# Patient Record
Sex: Male | Born: 1950
Health system: Southern US, Community
[De-identification: ages and names within clinical notes are randomized; demographics above are authoritative.]

## PROBLEM LIST (undated history)

## (undated) DIAGNOSIS — G4730 Sleep apnea, unspecified: Secondary | ICD-10-CM

## (undated) DIAGNOSIS — E785 Hyperlipidemia, unspecified: Secondary | ICD-10-CM

## (undated) DIAGNOSIS — I10 Essential (primary) hypertension: Secondary | ICD-10-CM

## (undated) DIAGNOSIS — I480 Paroxysmal atrial fibrillation: Secondary | ICD-10-CM

## (undated) DIAGNOSIS — E669 Obesity, unspecified: Secondary | ICD-10-CM

## (undated) DIAGNOSIS — I5032 Chronic diastolic (congestive) heart failure: Secondary | ICD-10-CM

## (undated) DIAGNOSIS — R600 Localized edema: Secondary | ICD-10-CM

## (undated) DIAGNOSIS — I499 Cardiac arrhythmia, unspecified: Secondary | ICD-10-CM

## (undated) DIAGNOSIS — E119 Type 2 diabetes mellitus without complications: Secondary | ICD-10-CM

## (undated) HISTORY — DX: Localized edema: R60.0

## (undated) HISTORY — DX: Essential (primary) hypertension: I10

## (undated) HISTORY — DX: Chronic diastolic (congestive) heart failure: I50.32

## (undated) HISTORY — PX: CARDIAC CATHETERIZATION: SHX172

## (undated) HISTORY — DX: Hyperlipidemia, unspecified: E78.5

## (undated) HISTORY — DX: Obesity, unspecified: E66.9

## (undated) HISTORY — DX: Paroxysmal atrial fibrillation: I48.0

## (undated) HISTORY — DX: Type 2 diabetes mellitus without complications: E11.9

---

## 2001-12-23 ENCOUNTER — Ambulatory Visit (HOSPITAL_COMMUNITY): Admission: RE | Admit: 2001-12-23 | Discharge: 2001-12-23 | Payer: Self-pay | Admitting: *Deleted

## 2002-04-05 ENCOUNTER — Encounter: Admission: RE | Admit: 2002-04-05 | Discharge: 2002-07-04 | Payer: Self-pay | Admitting: Internal Medicine

## 2009-10-28 DIAGNOSIS — E7800 Pure hypercholesterolemia, unspecified: Secondary | ICD-10-CM | POA: Insufficient documentation

## 2009-12-04 DIAGNOSIS — M109 Gout, unspecified: Secondary | ICD-10-CM | POA: Insufficient documentation

## 2010-11-25 HISTORY — PX: CARDIAC CATHETERIZATION: SHX172

## 2011-07-17 ENCOUNTER — Inpatient Hospital Stay (HOSPITAL_COMMUNITY)
Admission: EM | Admit: 2011-07-17 | Discharge: 2011-07-18 | DRG: 287 | Disposition: A | Discharge status: Home / Self Care | Payer: 59 | Attending: Cardiovascular Disease | Admitting: Cardiovascular Disease

## 2011-07-17 ENCOUNTER — Emergency Department (HOSPITAL_COMMUNITY): Payer: 59

## 2011-07-17 DIAGNOSIS — Z79899 Other long term (current) drug therapy: Secondary | ICD-10-CM

## 2011-07-17 DIAGNOSIS — G4730 Sleep apnea, unspecified: Secondary | ICD-10-CM | POA: Diagnosis present

## 2011-07-17 DIAGNOSIS — Z794 Long term (current) use of insulin: Secondary | ICD-10-CM

## 2011-07-17 DIAGNOSIS — I129 Hypertensive chronic kidney disease with stage 1 through stage 4 chronic kidney disease, or unspecified chronic kidney disease: Secondary | ICD-10-CM | POA: Diagnosis present

## 2011-07-17 DIAGNOSIS — Z87891 Personal history of nicotine dependence: Secondary | ICD-10-CM

## 2011-07-17 DIAGNOSIS — I200 Unstable angina: Secondary | ICD-10-CM | POA: Diagnosis present

## 2011-07-17 DIAGNOSIS — N189 Chronic kidney disease, unspecified: Secondary | ICD-10-CM | POA: Diagnosis present

## 2011-07-17 DIAGNOSIS — E119 Type 2 diabetes mellitus without complications: Secondary | ICD-10-CM | POA: Diagnosis present

## 2011-07-17 DIAGNOSIS — Z7982 Long term (current) use of aspirin: Secondary | ICD-10-CM

## 2011-07-17 DIAGNOSIS — I4891 Unspecified atrial fibrillation: Principal | ICD-10-CM | POA: Diagnosis present

## 2011-07-17 DIAGNOSIS — Z23 Encounter for immunization: Secondary | ICD-10-CM

## 2011-07-17 DIAGNOSIS — E785 Hyperlipidemia, unspecified: Secondary | ICD-10-CM | POA: Diagnosis present

## 2011-07-17 LAB — GLUCOSE, CAPILLARY

## 2011-07-17 LAB — URINALYSIS, ROUTINE W REFLEX MICROSCOPIC
Bilirubin Urine: NEGATIVE
Nitrite: NEGATIVE
Protein, ur: 30 mg/dL — AB
Specific Gravity, Urine: 1.021 (ref 1.005–1.030)
Urobilinogen, UA: 0.2 mg/dL (ref 0.0–1.0)

## 2011-07-17 LAB — COMPREHENSIVE METABOLIC PANEL
ALT: 20 U/L (ref 0–53)
CO2: 29 mEq/L (ref 19–32)
Calcium: 10.3 mg/dL (ref 8.4–10.5)
Chloride: 101 mEq/L (ref 96–112)
GFR calc Af Amer: >60 mL/min (ref >60)
GFR calc non Af Amer: >60 mL/min (ref >60)
Glucose, Bld: 188 mg/dL — ABNORMAL HIGH (ref 70–99)
Sodium: 138 mEq/L (ref 135–145)
Total Bilirubin: 0.2 mg/dL — ABNORMAL LOW (ref 0.3–1.2)

## 2011-07-17 LAB — URINE MICROSCOPIC-ADD ON

## 2011-07-17 LAB — BASIC METABOLIC PANEL
GFR calc Af Amer: >60 mL/min (ref >60)
GFR calc non Af Amer: >60 mL/min (ref >60)
Potassium: 3.7 mEq/L (ref 3.5–5.1)
Sodium: 134 mEq/L — ABNORMAL LOW (ref 135–145)

## 2011-07-17 LAB — MRSA PCR SCREENING: MRSA by PCR: NEGATIVE

## 2011-07-17 LAB — DIFFERENTIAL
Basophils Absolute: 0 10*3/uL (ref 0.0–0.1)
Basophils Relative: 0 % (ref 0–1)
Eosinophils Absolute: 0.1 10*3/uL (ref 0.0–0.7)
Neutrophils Relative %: 61 % (ref 43–77)

## 2011-07-17 LAB — CBC
Platelets: 288 10*3/uL (ref 150–400)
RBC: 5.12 MIL/uL (ref 4.22–5.81)
WBC: 5.9 10*3/uL (ref 4.0–10.5)

## 2011-07-17 LAB — TSH: TSH: 1.92 u[IU]/mL (ref 0.350–4.500)

## 2011-07-17 LAB — POCT I-STAT TROPONIN I: Troponin i, poc: 0 ng/mL (ref 0.00–0.08)

## 2011-07-18 LAB — BASIC METABOLIC PANEL
Chloride: 100 mEq/L (ref 96–112)
GFR calc Af Amer: 59 mL/min — ABNORMAL LOW (ref >60)
GFR calc non Af Amer: 49 mL/min — ABNORMAL LOW (ref >60)
Potassium: 3.8 mEq/L (ref 3.5–5.1)
Sodium: 135 mEq/L (ref 135–145)

## 2011-07-18 LAB — POCT ACTIVATED CLOTTING TIME: Activated Clotting Time: 127 seconds

## 2011-07-18 LAB — LIPID PANEL
Cholesterol: 229 mg/dL — ABNORMAL HIGH (ref 0–200)
HDL: 46 mg/dL (ref >39)
Total CHOL/HDL Ratio: 5 RATIO

## 2011-07-18 LAB — CBC
Platelets: 282 10*3/uL (ref 150–400)
RBC: 4.82 MIL/uL (ref 4.22–5.81)
RDW: 12.7 % (ref 11.5–15.5)
WBC: 6.2 10*3/uL (ref 4.0–10.5)

## 2011-07-18 LAB — GLUCOSE, CAPILLARY: Glucose-Capillary: 189 mg/dL — ABNORMAL HIGH (ref 70–99)

## 2011-07-18 LAB — HEMOGLOBIN A1C
Hgb A1c MFr Bld: 11.1 % — ABNORMAL HIGH (ref <5.7)
Mean Plasma Glucose: 272 mg/dL — ABNORMAL HIGH (ref <117)

## 2011-07-18 LAB — HEPARIN LEVEL (UNFRACTIONATED): Heparin Unfractionated: 0.48 IU/mL (ref 0.30–0.70)

## 2011-07-19 NOTE — Cardiovascular Report (Signed)
  Raymond Gordon, Raymond Gordon NO.:  [id number]  MEDICAL RECORD NO.:  [id number]  LOCATION:  2919                         FACILITY:  [facility]  PHYSICIAN:  Raymond Leana Springston, MD         DATE OF BIRTH:  01-04-1951  DATE OF PROCEDURE:  07/18/2011 DATE OF DISCHARGE:  07/18/2011                           CARDIAC CATHETERIZATION   OPERATOR:  Raymond Shaddai Shapley, MD  INDICATION:  Chest pain and dynamic EKG changes in the setting of atrial fibrillation and rapid ventricular response.  HISTORY OF PRESENT ILLNESS:  Raymond Gordon is a 60 year old gentleman who presented with substernal chest pain and increasing shortness of breath. He was found to be in atrial fibrillation with rapid ventricular response, subsequently converted spontaneously to sinus rhythm overnight.  However, had diffuse ST depression with his rapid ventricular response and given his cardiac risk factors referred for a left heart catheterization.  PROCEDURE:  After informed consent was obtained, the patient was brought to the cardiac catheterization lab and sterilely prepped and draped in the usual fashion.  After procedural and radiation safety time-out, the area around the right femoral artery was identified and anesthetized with approximately 5 mL of 1% lidocaine.  The right femoral artery was accessed easily with a straight wire needle and a right 5-French right femoral access sheath was placed.  A subsequent catheterization was performed with a 5-French pigtail, JL-4, JR-4 and ultimately Williams right catheter which was used to engage the right coronary.  A left ventriculogram was performed.  Estimated blood loss was less than 10 mL. There were no acute complications.  FINDINGS: 1. Mild luminal irregularities, small vessels without stenosis. 2. LV gram - EF 70-75%, no wall motion abnormalities.  PLAN:  Raymond Gordon has no significant obstructive coronary artery disease and is likely okay to be discharged home later today.   Given his cardiac risk factors and CHADS-2 score of greater than 2, I would recommend anticoagulation with oral anticoagulant such as Pradaxa.  Our plan is to give him one single dose Pradaxa once he returns to the Cardiac Care Unit and we will discontinue his heparin drip approximately 2 hours afterwards.  He should go home on 150 mg Pradaxa twice daily and will follow up with Raymond Gordon in the office. It should be noted he had 8 second  pauses while on a diltiazem drip in atrial fibrillation and none since he converted spontaneously to sinus rhythm. I will arrange an outpatient Holter monitor to look for any more ominous brady-arryhmias or pauses. There is no clear indication for pacemaker at this time.     Raymond Phiona Ramnauth, MD     CH/MEDQ  D:  07/18/2011  T:  07/18/2011  Job:  [id number]  cc:   Gaspar Garbe, M.D. Nanetta Batty, M.D.  Electronically Signed by Kirtland Bouchard. Cheyenne Bordeaux M.D. on 07/19/2011 10:55:07 AM

## 2011-08-04 ENCOUNTER — Emergency Department (HOSPITAL_COMMUNITY)
Admission: EM | Admit: 2011-08-04 | Discharge: 2011-08-04 | Disposition: A | Discharge status: Home / Self Care | Payer: 59 | Attending: Emergency Medicine | Admitting: Emergency Medicine

## 2011-08-04 DIAGNOSIS — I10 Essential (primary) hypertension: Secondary | ICD-10-CM | POA: Insufficient documentation

## 2011-08-04 DIAGNOSIS — R42 Dizziness and giddiness: Secondary | ICD-10-CM | POA: Insufficient documentation

## 2011-08-04 DIAGNOSIS — E1169 Type 2 diabetes mellitus with other specified complication: Secondary | ICD-10-CM | POA: Insufficient documentation

## 2011-08-04 DIAGNOSIS — E7800 Pure hypercholesterolemia, unspecified: Secondary | ICD-10-CM | POA: Insufficient documentation

## 2011-08-04 DIAGNOSIS — I4891 Unspecified atrial fibrillation: Secondary | ICD-10-CM | POA: Insufficient documentation

## 2011-08-04 LAB — GLUCOSE, CAPILLARY: Glucose-Capillary: 81 mg/dL (ref 70–99)

## 2011-08-08 ENCOUNTER — Emergency Department (HOSPITAL_COMMUNITY)
Admission: EM | Admit: 2011-08-08 | Discharge: 2011-08-08 | Disposition: A | Discharge status: Home / Self Care | Payer: 59 | Attending: Emergency Medicine | Admitting: Emergency Medicine

## 2011-08-08 DIAGNOSIS — I4891 Unspecified atrial fibrillation: Secondary | ICD-10-CM | POA: Insufficient documentation

## 2011-08-08 DIAGNOSIS — E1169 Type 2 diabetes mellitus with other specified complication: Secondary | ICD-10-CM | POA: Insufficient documentation

## 2011-08-08 DIAGNOSIS — F29 Unspecified psychosis not due to a substance or known physiological condition: Secondary | ICD-10-CM | POA: Insufficient documentation

## 2011-08-08 DIAGNOSIS — I10 Essential (primary) hypertension: Secondary | ICD-10-CM | POA: Insufficient documentation

## 2011-08-08 DIAGNOSIS — Z794 Long term (current) use of insulin: Secondary | ICD-10-CM | POA: Insufficient documentation

## 2011-08-08 DIAGNOSIS — E7800 Pure hypercholesterolemia, unspecified: Secondary | ICD-10-CM | POA: Insufficient documentation

## 2011-08-08 LAB — GLUCOSE, CAPILLARY: Glucose-Capillary: 93 mg/dL (ref 70–99)

## 2011-08-08 LAB — POCT I-STAT, CHEM 8
BUN: 7 mg/dL (ref 6–23)
Creatinine, Ser: 0.8 mg/dL (ref 0.50–1.35)
Glucose, Bld: 82 mg/dL (ref 70–99)
Hemoglobin: 14.3 g/dL (ref 13.0–17.0)
Potassium: 3.4 mEq/L — ABNORMAL LOW (ref 3.5–5.1)
TCO2: 26 mmol/L (ref 0–100)

## 2011-08-25 NOTE — Discharge Summary (Signed)
  Raymond Gordon, Raymond Gordon NO.:  [id number]  MEDICAL RECORD NO.:  [id number]  LOCATION:  2919                         FACILITY:  [facility]  PHYSICIAN:  Nanetta Batty, M.D.   DATE OF BIRTH:  09/04/1951  DATE OF ADMISSION:  07/17/2011 DATE OF DISCHARGE:  07/18/2011                              DISCHARGE SUMMARY   DISCHARGE DIAGNOSES: 1. Paroxysmal atrial fibrillation, new onset this admission, converted     spontaneously to sinus rhythm. 2. Chest pain worrisome for unstable angina in the setting of rapid     atrial fibrillation, catheterization this admission revealing     normal coronaries and normal left ventricular function. 3. Type 2 insulin-dependent diabetes. 4. Treated hypertension. 5. Treated dyslipidemia. 6. Family history of vascular disease. 7. Morbid obesity and suspected sleep apnea with truncal obesity. 8. Poor dentition  PLAN:  Raymond Gordon is a 60 year old male who has been followed by Dr. Wylene Simmer.  He has hypertension, dyslipidemia, and insulin-dependent diabetes.  He has no prior history of coronary disease and has not had arrhythmias.  This morning he developed chest tightness.  He was short of breath with this.  In the emergency room, he was found to be in rapid atrial fibrillation.  He was put on diltiazem and heparin and admitted for further evaluation.  He converted spontaneously during the night to sinus rhythm.  Catheterization was done July 18, 2011 by Dr. Rennis Golden which revealed essentially normal coronaries, he did have small diabetic vessels.  EF was 70-75% without wall motion abnormality.  Dr. Rennis Golden felt he would be a good candidate for Pradaxa.  We have made some other adjustments to his medications, see med rec for complete details.  LABORATORY DATA:  Cholesterol 229, HDL 46, LDL 163.  BNP was 163.  TSH 1.92.  Troponin was negative.  Hemoglobin A1c was 11.1.  Liver functions were normal.  Sodium 138, potassium 3.8, BUN 8, creatinine  0.74, INR 0.9, white count 5.9, hemoglobin 14.6, hematocrit 41.5, platelets 288,000.  Chest x-ray shows no acute process.  EKG now shows sinus rhythm, sinus bradycardia without acute changes.  In the emergency room, he did have some pauses that were significant when he converted to sinus rhythm, the pause was about 5 seconds.  He had no further pauses.  We did change his beta-blocker.  He will follow up with Dr. Allyson Sabal.  At some point, we may want to consider a sleep study.     Abelino Derrick, P.A.   ______________________________ Nanetta Batty, M.D.    [name]  D:  07/18/2011  T:  07/18/2011  Job:  [id number]  cc:   Nanetta Batty, M.D. Gaspar Garbe, M.D.  Electronically Signed by Corine Shelter P.A. on 07/22/2011 09:37:59 AM Electronically Signed by Nanetta Batty M.D. on 08/25/2011 11:40:51 AM

## 2011-08-25 NOTE — H&P (Signed)
Raymond Gordon, Raymond Gordon NO.:  [id number]  MEDICAL RECORD NO.:  [id number]  LOCATION:  [facility]                         FACILITY:  [facility]  PHYSICIAN:  Nanetta Batty, M.D.   DATE OF BIRTH:  06-Jun-1951  DATE OF ADMISSION:  07/17/2011 DATE OF DISCHARGE:                             HISTORY & PHYSICAL   CHIEF COMPLAINT:  Chest pain.  HISTORY OF PRESENT ILLNESS:  Mr. Russi is a 60 year old male who is followed by Dr. Wylene Simmer.  He has hypertension, dyslipidemia, and insulin- dependent diabetes.  He has no prior history of coronary artery disease and has had no prior cardiac evaluation according to the patient.  This morning he developed some chest tightness.  This was described as a midsternal tightness.  He was short of breath with this.  He came to the emergency room at [facility] and was found to be in rapid atrial fibrillation. His symptoms improved with IV diltiazem.  He is admitted now for further evaluation.  PAST MEDICAL HISTORY:  Remarkable for type 2 insulin-dependent diabetes. He has treated hypertension and treated dyslipidemia.  He denies any major surgeries in the past.  HOME MEDICATIONS:  Diovan, Lipitor, metformin, Humulin 70/30, and Lopressor, doses are unknown yet.  ALLERGIES:  He has no known drug allergies.  SOCIAL HISTORY:  He is single, he lives with his mother.  He quit smoking in 1999.  He works for [employer] in [employer], and has worked there for 38 years.  He denies alcohol use.  FAMILY HISTORY:  Remarkable that his father died of a stroke and he has a brother who has had a stroke.  REVIEW OF SYSTEMS:  Essentially unremarkable except for noted above.  He denies any GI bleeding or melena.  He has not had any kidney disease or trouble voiding, prostate problems.  He denies any retinopathy, nephropathy, or neuropathy from his diabetes.  PHYSICAL EXAM:  VITAL SIGNS:  Blood pressure 147/71, pulse 128, temperature 98.6. GENERAL:  He  is an obese, African American male, in no acute distress. HEENT:  Normocephalic.  He wears glasses.  He has poor dentition. NECK:  Without JVD or bruit.  Thyroid is not enlarged. CHEST:  Clear to auscultation and percussion. CARDIAC:  Reveals irregularly irregular rhythm without murmur, rub, or gallop.  Normal S1 and S2. ABDOMEN:  Obese, nontender.  No bruits. EXTREMITIES:  Reveal 1+ pretibial edema bilaterally with intact distal pulses. NEURO:  Grossly intact.  He is awake, alert, oriented, and cooperative. Moves all extremities without obvious deficit. SKIN:  Cool and dry.  LABS:  Sodium 134, potassium 3.7, BUN 9, creatinine 0.66.  White count 5.9, hemoglobin 14.6, hematocrit 41.5, platelets 228.  Troponin is negative x1.  Chest x-rays are negative.  EKG shows atrial fibrillation with rapid ventricular response.  IMPRESSION: 1. Chest pain consistent with unstable angina in the setting of rapid     atrial fibrillation. 2. Rapid atrial fibrillation, apparently is new onset. 3. Type 2 insulin-dependent diabetes. 4. Treated hypertension. 5. Treated dyslipidemia. 6. Family history of vascular disease. 7. Morbid obesity and possible sleep apnea with truncal obesity. 8. Poor dentition.  PLAN:  The patient will  be admitted by Dr. Allyson Sabal and myself.  He is on IV diltiazem now and we will add IV heparin and continue metoprolol.  He will most likely need diagnostic catheterization because of his unstable angina and multiple cardiac risk factors.     Abelino Derrick, P.A.   ______________________________ Nanetta Batty, M.D.    [name]  D:  07/17/2011  T:  07/17/2011  Job:  [id number]  Electronically Signed by Corine Shelter P.A. on 07/22/2011 09:37:54 AM Electronically Signed by Nanetta Batty M.D. on 08/25/2011 11:40:48 AM

## 2013-02-16 DIAGNOSIS — I517 Cardiomegaly: Secondary | ICD-10-CM | POA: Insufficient documentation

## 2013-02-16 DIAGNOSIS — R972 Elevated prostate specific antigen [PSA]: Secondary | ICD-10-CM | POA: Insufficient documentation

## 2013-04-07 ENCOUNTER — Other Ambulatory Visit: Payer: Self-pay | Admitting: *Deleted

## 2013-04-07 MED ORDER — DABIGATRAN ETEXILATE MESYLATE 150 MG PO CAPS: 150.0000 mg | ORAL_CAPSULE | Freq: Two times a day (BID) | ORAL | Status: DC

## 2013-04-07 NOTE — Telephone Encounter (Signed)
Rx sent to pharmacy   

## 2013-04-08 ENCOUNTER — Telehealth: Payer: Self-pay | Admitting: Cardiovascular Disease

## 2013-04-08 ENCOUNTER — Other Ambulatory Visit: Payer: Self-pay | Admitting: *Deleted

## 2013-04-08 ENCOUNTER — Telehealth: Payer: Self-pay | Admitting: Internal Medicine

## 2013-04-08 MED ORDER — DABIGATRAN ETEXILATE MESYLATE 150 MG PO CAPS: 150.0000 mg | ORAL_CAPSULE | Freq: Two times a day (BID) | ORAL | Status: DC

## 2013-04-08 NOTE — Telephone Encounter (Signed)
Need clarification on how to take his Pradaxa!al

## 2013-04-08 NOTE — Telephone Encounter (Signed)
Need clarification on how to take his Pradaxa! Prescription was different from the last one!

## 2013-04-08 NOTE — Telephone Encounter (Signed)
Patient was confused on how to take his pradaxa. Informed him the directions did not change. He is to continue taking it twice daily. He was confused because the new instructions says every 12hrs vs twice a day.

## 2013-04-08 NOTE — Telephone Encounter (Signed)
Needs a prescription refill on; Pradaxa-150mg ... Pharmacy.... [facility]...[phone]..Thanks

## 2013-04-08 NOTE — Telephone Encounter (Signed)
RX REFILLED ON 5/14.

## 2013-07-18 ENCOUNTER — Other Ambulatory Visit: Payer: Self-pay | Admitting: Cardiovascular Disease

## 2013-10-03 ENCOUNTER — Other Ambulatory Visit: Payer: Self-pay | Admitting: Cardiovascular Disease

## 2013-10-04 ENCOUNTER — Other Ambulatory Visit: Payer: Self-pay | Admitting: *Deleted

## 2013-10-04 MED ORDER — CARVEDILOL 25 MG PO TABS: 37.5000 mg | ORAL_TABLET | Freq: Two times a day (BID) | ORAL | Status: DC

## 2013-10-04 NOTE — Telephone Encounter (Signed)
Rx was sent to pharmacy electronically. 

## 2013-11-30 ENCOUNTER — Encounter: Payer: Self-pay | Admitting: Cardiovascular Disease

## 2013-11-30 ENCOUNTER — Ambulatory Visit (INDEPENDENT_AMBULATORY_CARE_PROVIDER_SITE_OTHER): Payer: 59 | Admitting: Cardiovascular Disease

## 2013-11-30 VITALS — BP 160/60 | HR 66 | Ht 64.0 in | Wt 258.0 lb

## 2013-11-30 DIAGNOSIS — I480 Paroxysmal atrial fibrillation: Secondary | ICD-10-CM | POA: Insufficient documentation

## 2013-11-30 DIAGNOSIS — E119 Type 2 diabetes mellitus without complications: Secondary | ICD-10-CM | POA: Insufficient documentation

## 2013-11-30 DIAGNOSIS — I10 Essential (primary) hypertension: Secondary | ICD-10-CM | POA: Insufficient documentation

## 2013-11-30 DIAGNOSIS — I4891 Unspecified atrial fibrillation: Secondary | ICD-10-CM

## 2013-11-30 DIAGNOSIS — E1169 Type 2 diabetes mellitus with other specified complication: Secondary | ICD-10-CM | POA: Insufficient documentation

## 2013-11-30 DIAGNOSIS — E785 Hyperlipidemia, unspecified: Secondary | ICD-10-CM | POA: Insufficient documentation

## 2013-11-30 NOTE — Assessment & Plan Note (Signed)
Maintaining sinus rhythm on Pradaxa oral anticoagulations without recurrence

## 2013-11-30 NOTE — Progress Notes (Signed)
11/30/2013 Raymond Gordon   05/28/1951  [id number]  Primary Physician No primary provider on file. Primary Cardiologist: Runell GessJonathan J. Pricella Gaugh MD Roseanne RenoFACP,FACC,FAHA, FSCAI   HPI:  The patient is a very pleasant, 63 year old severely overweight, single [place] male with no children who I last saw in the office 6 months ago. 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 His risk factors including noninsulin-requiring diabetes, hypertension, hyperlipidemia and remote tobacco abuse having quit smoking back in 1999 and had smoked 1 pack per day. His father did have an MI and had bypass surgery. He was [facility] July 17, 2011, with chest pain and A-fib. He was catheterized by Dr. Italyhad Hilty on July 18, 2011, revealing minimal luminal irregularities with small vessel disease and normal LV function. He ultimately spontaneously converted to sinus rhythm and was placed on Pradaxa because of a CHADS2 score of 2. He has had no recurrent symptoms. He denies chest pain or shortness of breath.Dr. Wylene Simmerisovec follows  his lipid profile.   Current Outpatient Prescriptions  Medication Sig Dispense Refill  . allopurinol (ZYLOPRIM) 100 MG tablet Take 100 mg by mouth daily.       Marland Kitchen. amLODipine (NORVASC) 10 MG tablet Take 10 mg by mouth daily.       Marland Kitchen. atorvastatin (LIPITOR) 40 MG tablet Take 40 mg by mouth daily at 6 PM.       . carvedilol (COREG) 25 MG tablet Take 1.5 tablets (37.5 mg total) by mouth 2 (two) times daily with a meal.  90 tablet  0  . dabigatran (PRADAXA) 150 MG CAPS Take 1 capsule (150 mg total) by mouth every 12 (twelve) hours.  60 capsule  6  . DIOVAN 160 MG tablet Take 160 mg by mouth 2 (two) times daily.       Marland Kitchen. doxazosin (CARDURA) 4 MG tablet Take 4 mg by mouth daily.       Marland Kitchen. FARXIGA 10 MG TABS Take 10 mg by mouth daily.       . hydrochlorothiazide (HYDRODIURIL) 25 MG tablet Take 25 mg by mouth daily.      . insulin NPH-regular Human (NOVOLIN 70/30) (70-30) 100 UNIT/ML injection  Inject into the skin. 70 units in the morning, 50 units at lunch, and 50 units at night      . metoprolol tartrate (LOPRESSOR) 25 MG tablet Take 25 mg by mouth daily.      . Multiple Vitamin (MULTIVITAMIN) capsule Take 1 capsule by mouth daily.      . pantoprazole (PROTONIX) 40 MG tablet Take 40 mg by mouth daily.       . ramipril (ALTACE) 10 MG capsule Take 10 mg by mouth 2 (two) times daily.        No current facility-administered medications for this visit.    No Known Allergies  History   Social History  . Marital Status: Single    Spouse Name: N/A    Number of Children: N/A  . Years of Education: N/A   Occupational History  . Not on file.   Social History Main Topics  . Smoking status: Former Smoker    Quit date: 11/25/1997  . Smokeless tobacco: Not on file  . Alcohol Use: Not on file  . Drug Use: Not on file  . Sexual Activity: Not on file   Other Topics Concern  . Not on file   Social History Narrative  . No narrative on file  Review of Systems: General: negative for chills, fever, night sweats or weight changes.  Cardiovascular: negative for chest pain, dyspnea on exertion, edema, orthopnea, palpitations, paroxysmal nocturnal dyspnea or shortness of breath Dermatological: negative for rash Respiratory: negative for cough or wheezing Urologic: negative for hematuria Abdominal: negative for nausea, vomiting, diarrhea, bright red blood per rectum, melena, or hematemesis Neurologic: negative for visual changes, syncope, or dizziness All other systems reviewed and are otherwise negative except as noted above.    Blood pressure 160/60, pulse 66, height 5\' 4"  (1.626 m), weight 258 lb (117.028 kg).  General appearance: alert and no distress Neck: no adenopathy, no carotid bruit, no JVD, supple, symmetrical, trachea midline and thyroid not enlarged, symmetric, no tenderness/mass/nodules Lungs: clear to auscultation bilaterally Heart: regular rate and rhythm, S1,  S2 normal, no murmur, click, rub or gallop Extremities: extremities normal, atraumatic, no cyanosis or edema  EKG normal sinus rhythm at 66 without ST or T wave changes  ASSESSMENT AND PLAN:   Paroxysmal atrial fibrillation Maintaining sinus rhythm on Pradaxa oral anticoagulations without recurrence  Essential hypertension Borderline controlled on current medications  Hyperlipidemia On statin therapy followed by his PCP      Runell Gess MD [facility], [facility] 11/30/2013 10:26 AM

## 2013-11-30 NOTE — Assessment & Plan Note (Signed)
On statin therapy followed by his PCP 

## 2013-11-30 NOTE — Patient Instructions (Signed)
No changes in medication.  Dr Allyson SabalBerry wants you to follow-up in 1 year. You will receive a reminder letter in the mail two months in advance. If you don't receive a letter, please call our office to schedule the follow-up appointment.

## 2013-11-30 NOTE — Assessment & Plan Note (Signed)
Borderline controlled on current medications 

## 2013-12-01 ENCOUNTER — Encounter: Payer: Self-pay | Admitting: Cardiovascular Disease

## 2013-12-06 ENCOUNTER — Other Ambulatory Visit: Payer: Self-pay | Admitting: Cardiovascular Disease

## 2013-12-06 MED ORDER — PANTOPRAZOLE SODIUM 40 MG PO TBEC: 40.0000 mg | DELAYED_RELEASE_TABLET | Freq: Every day | ORAL | Status: DC

## 2013-12-06 NOTE — Telephone Encounter (Signed)
Rx was sent to pharmacy electronically. 

## 2014-05-05 ENCOUNTER — Encounter: Payer: Self-pay | Admitting: Podiatrist

## 2014-05-05 ENCOUNTER — Ambulatory Visit (INDEPENDENT_AMBULATORY_CARE_PROVIDER_SITE_OTHER): Payer: 59 | Admitting: Podiatrist

## 2014-05-05 VITALS — BP 143/64 | HR 64 | Resp 18 | Ht 65.0 in | Wt 269.0 lb

## 2014-05-05 DIAGNOSIS — E1151 Type 2 diabetes mellitus with diabetic peripheral angiopathy without gangrene: Secondary | ICD-10-CM

## 2014-05-05 DIAGNOSIS — I798 Other disorders of arteries, arterioles and capillaries in diseases classified elsewhere: Secondary | ICD-10-CM

## 2014-05-05 DIAGNOSIS — M79609 Pain in unspecified limb: Secondary | ICD-10-CM

## 2014-05-05 DIAGNOSIS — E1159 Type 2 diabetes mellitus with other circulatory complications: Secondary | ICD-10-CM

## 2014-05-05 DIAGNOSIS — I890 Lymphedema, not elsewhere classified: Secondary | ICD-10-CM

## 2014-05-05 DIAGNOSIS — B351 Tinea unguium: Secondary | ICD-10-CM

## 2014-05-05 DIAGNOSIS — L03039 Cellulitis of unspecified toe: Secondary | ICD-10-CM

## 2014-05-05 NOTE — Patient Instructions (Signed)
Purchase some Polysporin and large bandaids from the pharmacy.  Every day cleanse your left great toe with soap and water.  Blot dry and apply polysporin and a bandaid.  If the area has not healed or scabbed over in 1 week, please call so I can take another look at it.  I am ordering you some compression stockings-- you can find these at [facility] as they will measure you for the correct pair  Lymphedema Lymphedema is a swelling caused by the abnormal collection of lymph under the skin. The lymph is fluid from the tissues in your body that travels in the lymphatic system. This system is part of the immune system that includes lymph nodes and vessels. The lymph vessels collect and carry the excess fluid, fats, proteins, and wastes from the tissues of the body to the bloodstream. This system also works to clean and remove bacteria and waste products from the body.  Lymphedema occurs when the lymphatic system is blocked. When the lymph vessels or lymph nodes are blocked or damaged, lymph does not drain properly. This causes abnormal build up of lymph. This leads to swelling in the arms or legs. Lymphedema cannot be cured by medicines. But the swelling can be reduced by physical methods. CAUSES  There are two types of lymphedema. Primary lymphedema is caused by the absence or abnormality of the lymph vessel at birth. It is also known as inherited lymphedema, which occurs rarely. Secondary or acquired lymphedema occurs when the lymph vessel is damaged or blocked. The causes of lymph vessel blockage are:   Skin infection like cellulites.  Infection by parasites (filariasis).  Injury.  Cancer.  Radiation therapy.  Formation of scar tissue.  Surgery. SYMPTOMS  The symptoms of lymphedema are:  Abnormal swelling of the arm or leg.  Heavy or tight feeling in your arm or leg.  Tight-fitting shoes or rings.  Redness of skin over the affected area.  Limited movement of the affected  limb.  Some patients complain about sensitivity to touch and discomfort in the limb(s) affected. You may not have these symptoms immediately following injury. They usually appear within a few days or even years after injury. Inform your caregiver, if you have any of these symptoms. Early treatment can avoid further problems.  DIAGNOSIS  First, your caregiver will inquire about any surgery you have had or medicines you are taking. He will then examine you. Your caregiver may order special imaging tests, such as:  Lymphoscintigraphy (a test in which a low dose of radioactive substance is injected to trace the flow of lymph through the lymph vessels).  MRI (imaging tests using magnetic fields).  Computed tomography (test using special cross-sectional X-rays).  Duplex ultrasound (test using high-frequency sound waves to show the vessels and the blood flow on a screen).  Lymphangiography (special X-ray taken after injecting a contrast dye into the lymph vessel). It is now rarely done. TREATMENT  Lymphedema can be treated in different ways. Your caregiver will decide the type of treatment depending on the cause. Treatment may include:  Exercise: Special exercises will help fluid move out easily from the affected part. This should be done as per your caregiver's advice.  Manual lymph drainage: Gentle massage of the affected limb makes the fluid to move out more freely.  Compression: Compression stockings or external pump apply pressure over the affected limb. This helps the fluid to move out from the arm or leg. Bandaging can also help to move the fluid  out from the affected part. Your caregiver will decide the method that suits you the best.  Medicines: Your caregiver may prescribe antibiotics, if you have infection.  Surgery: Your caregiver may advise surgery for severe lymphedema. It is reserved for special cases when the patient has difficulty moving. Your surgeon may remove excess tissue  from the arm or leg. This will help to ease your movement. Physical therapy may have to be continued after surgery. HOME CARE INSTRUCTIONS  The area is very fragile and is predisposed to injury and infection.  Eat a healthy diet.  Exercise regularly as per advice.  Keep the affected area clean and dry.  Use gloves while cooking or gardening.  Protect your skin from cuts.  Use electric razor to shave the affected area.  Keep affected limb elevated.  Do not wear tight clothes, shoes, or jewelry as it may cause the tissue to be strangled.  Do not use heat pads over the affected area.  Do not sit with cross legs.  Do not walk barefoot.  Do not carry weight on the affected arm.  Avoid having blood pressure checked on the affected limb. SEEK MEDICAL CARE IF:  You continue to have swelling in your limb. SEEK IMMEDIATE MEDICAL CARE IF:   You have high fever.  You have skin rash.  You have chills or sweats.  You have pain or redness.  You have a cut that does not heal. MAKE SURE YOU:   Understand these instructions.  Will watch your condition.  Will get help right away if you are not doing well or get worse. Document Released: 09/08/2007 Document Revised: 10/28/2012 Document Reviewed: 08/14/2009 [facility] Patient Information 2014 [place], [place].

## 2014-05-05 NOTE — Progress Notes (Signed)
   Subjective:    Patient ID: Raymond Gordon, male    DOB: 1950-12-29, 63 y.o.   MRN: [id number]  HPI Comments: Pt presents for diabetic foot exam and debridement of 10 toenails.  Pt states his legs swelling most of the time, especially when weighbearing, but decrease when elevated.     Review of Systems  Constitutional: Positive for diaphoresis and fatigue.  Respiratory: Positive for shortness of breath.   Cardiovascular: Positive for leg swelling.  Skin:       Changing the discoloration and thickening of the left 1st toenail.  All other systems reviewed and are negative.      Objective:   Physical Exam Pedal pulses faintly palpable at 1/4 dp and pt.  He has lymphedema and wears compressive support hose.  Normal proximal to distal temperature gradient.  Neurological sensation intact protectively and epicritically.  Left hallux has an ingrown paronychia most likely from the edema and compressive stockings.  It is red, tender and irritated.  Musculoskeletal exam is within normal limits.         Assessment & Plan:  paronyahia left hallux--  Plan:  Treatment options and alternatives discussed.  Recommended an incision and drainage and patient agreed.  left was prepped with alcohol and a 1 to 1 mix of 0.5% marcaine plain and 2% lidocaine plain was administered in a digital block fashion.  The toe was then prepped with betadine solution and exsanguinated.  The offending nail border was then excised and all necrotic tissue was resected.  The area was then cleansed well and antibiotic ointment and a dry sterile dressing was applied.  The patient was dispensed instructions for aftercare.

## 2014-05-20 ENCOUNTER — Inpatient Hospital Stay (HOSPITAL_COMMUNITY)
Admission: EM | Admit: 2014-05-20 | Discharge: 2014-05-21 | DRG: 637 | Disposition: A | Discharge status: Home / Self Care | Payer: 59 | Attending: Internal Medicine | Admitting: Internal Medicine

## 2014-05-20 ENCOUNTER — Inpatient Hospital Stay (HOSPITAL_COMMUNITY): Payer: 59

## 2014-05-20 ENCOUNTER — Encounter (HOSPITAL_COMMUNITY): Payer: Self-pay | Admitting: Emergency Medicine

## 2014-05-20 ENCOUNTER — Emergency Department (HOSPITAL_COMMUNITY): Payer: 59

## 2014-05-20 DIAGNOSIS — Z87891 Personal history of nicotine dependence: Secondary | ICD-10-CM

## 2014-05-20 DIAGNOSIS — E1169 Type 2 diabetes mellitus with other specified complication: Principal | ICD-10-CM | POA: Diagnosis present

## 2014-05-20 DIAGNOSIS — E785 Hyperlipidemia, unspecified: Secondary | ICD-10-CM | POA: Diagnosis present

## 2014-05-20 DIAGNOSIS — E119 Type 2 diabetes mellitus without complications: Secondary | ICD-10-CM | POA: Diagnosis present

## 2014-05-20 DIAGNOSIS — I509 Heart failure, unspecified: Secondary | ICD-10-CM | POA: Diagnosis present

## 2014-05-20 DIAGNOSIS — I10 Essential (primary) hypertension: Secondary | ICD-10-CM | POA: Diagnosis present

## 2014-05-20 DIAGNOSIS — T463X5A Adverse effect of coronary vasodilators, initial encounter: Secondary | ICD-10-CM | POA: Diagnosis not present

## 2014-05-20 DIAGNOSIS — I4891 Unspecified atrial fibrillation: Secondary | ICD-10-CM | POA: Diagnosis present

## 2014-05-20 DIAGNOSIS — E162 Hypoglycemia, unspecified: Secondary | ICD-10-CM

## 2014-05-20 DIAGNOSIS — J810 Acute pulmonary edema: Secondary | ICD-10-CM

## 2014-05-20 DIAGNOSIS — Z6841 Body Mass Index (BMI) 40.0 and over, adult: Secondary | ICD-10-CM

## 2014-05-20 DIAGNOSIS — Z7901 Long term (current) use of anticoagulants: Secondary | ICD-10-CM

## 2014-05-20 DIAGNOSIS — I5031 Acute diastolic (congestive) heart failure: Secondary | ICD-10-CM | POA: Diagnosis present

## 2014-05-20 DIAGNOSIS — R55 Syncope and collapse: Secondary | ICD-10-CM | POA: Diagnosis present

## 2014-05-20 DIAGNOSIS — I480 Paroxysmal atrial fibrillation: Secondary | ICD-10-CM | POA: Diagnosis present

## 2014-05-20 DIAGNOSIS — J811 Chronic pulmonary edema: Secondary | ICD-10-CM | POA: Diagnosis present

## 2014-05-20 DIAGNOSIS — I959 Hypotension, unspecified: Secondary | ICD-10-CM | POA: Diagnosis not present

## 2014-05-20 DIAGNOSIS — Z7982 Long term (current) use of aspirin: Secondary | ICD-10-CM

## 2014-05-20 HISTORY — DX: Cardiac arrhythmia, unspecified: I49.9

## 2014-05-20 HISTORY — DX: Hypoglycemia, unspecified: E16.2

## 2014-05-20 LAB — CBG MONITORING, ED
GLUCOSE-CAPILLARY: 70 mg/dL (ref 70–99)
GLUCOSE-CAPILLARY: 52 mg/dL — AB (ref 70–99)
GLUCOSE-CAPILLARY: 74 mg/dL (ref 70–99)
Glucose-Capillary: 114 mg/dL — ABNORMAL HIGH (ref 70–99)
Glucose-Capillary: 72 mg/dL (ref 70–99)
Glucose-Capillary: 61 mg/dL — ABNORMAL LOW (ref 70–99)

## 2014-05-20 LAB — COMPREHENSIVE METABOLIC PANEL
ALBUMIN: 3.6 g/dL (ref 3.5–5.2)
ALT: 24 U/L (ref 0–53)
AST: 30 U/L (ref 0–37)
Alkaline Phosphatase: 99 U/L (ref 39–117)
BILIRUBIN TOTAL: 0.3 mg/dL (ref 0.3–1.2)
BUN: 12 mg/dL (ref 6–23)
CALCIUM: 10 mg/dL (ref 8.4–10.5)
CHLORIDE: 101 meq/L (ref 96–112)
CO2: 22 mEq/L (ref 19–32)
Creatinine, Ser: 0.88 mg/dL (ref 0.50–1.35)
GFR calc Af Amer: >90 mL/min (ref >90)
GFR calc non Af Amer: 90 mL/min — ABNORMAL LOW (ref >90)
Glucose, Bld: 62 mg/dL — ABNORMAL LOW (ref 70–99)
Potassium: 3.9 mEq/L (ref 3.7–5.3)
Sodium: 140 mEq/L (ref 137–147)
Total Protein: 7.8 g/dL (ref 6.0–8.3)

## 2014-05-20 LAB — CBC WITH DIFFERENTIAL/PLATELET
Basophils Absolute: 0 10*3/uL (ref 0.0–0.1)
Basophils Relative: 0 % (ref 0–1)
Eosinophils Absolute: 0.1 10*3/uL (ref 0.0–0.7)
Eosinophils Relative: 1 % (ref 0–5)
HEMATOCRIT: 37.1 % — AB (ref 39.0–52.0)
Hemoglobin: 11.4 g/dL — ABNORMAL LOW (ref 13.0–17.0)
Lymphocytes Relative: 20 % (ref 12–46)
Lymphs Abs: 1.4 10*3/uL (ref 0.7–4.0)
MCH: 25.2 pg — ABNORMAL LOW (ref 26.0–34.0)
MCHC: 30.7 g/dL (ref 30.0–36.0)
MCV: 82.1 fL (ref 78.0–100.0)
MONO ABS: 0.5 10*3/uL (ref 0.1–1.0)
Monocytes Relative: 7 % (ref 3–12)
NEUTROS ABS: 5.1 10*3/uL (ref 1.7–7.7)
Neutrophils Relative %: 72 % (ref 43–77)
Platelets: 233 10*3/uL (ref 150–400)
RBC: 4.52 MIL/uL (ref 4.22–5.81)
RDW: 14.8 % (ref 11.5–15.5)
WBC: 7.1 10*3/uL (ref 4.0–10.5)

## 2014-05-20 LAB — I-STAT CG4 LACTIC ACID, ED: LACTIC ACID, VENOUS: 1.02 mmol/L (ref 0.5–2.2)

## 2014-05-20 LAB — TROPONIN I

## 2014-05-20 LAB — I-STAT ARTERIAL BLOOD GAS, ED
Acid-base deficit: 3 mmol/L — ABNORMAL HIGH (ref 0.0–2.0)
Bicarbonate: 22 mEq/L (ref 20.0–24.0)
O2 Saturation: 95 %
PCO2 ART: 39.1 mmHg (ref 35.0–45.0)
PH ART: 7.358 (ref 7.350–7.450)
TCO2: 23 mmol/L (ref 0–100)
pO2, Arterial: 80 mmHg (ref 80.0–100.0)

## 2014-05-20 LAB — PROTIME-INR
INR: 1.5 — AB (ref 0.00–1.49)
Prothrombin Time: 18.1 seconds — ABNORMAL HIGH (ref 11.6–15.2)

## 2014-05-20 LAB — PRO B NATRIURETIC PEPTIDE: PRO B NATRI PEPTIDE: 302.3 pg/mL — AB (ref 0–125)

## 2014-05-20 LAB — MRSA PCR SCREENING: MRSA by PCR: NEGATIVE

## 2014-05-20 LAB — GLUCOSE, CAPILLARY: Glucose-Capillary: 53 mg/dL — ABNORMAL LOW (ref 70–99)

## 2014-05-20 MED ORDER — DABIGATRAN ETEXILATE MESYLATE 150 MG PO CAPS
150.0000 mg | ORAL_CAPSULE | Freq: Two times a day (BID) | ORAL | Status: DC
  Administered 2014-05-20 – 2014-05-21 (×2): 150 mg via ORAL
  Filled 2014-05-20 (×3): qty 1

## 2014-05-20 MED ORDER — NITROGLYCERIN 0.4 MG SL SUBL: 0.4000 mg | SUBLINGUAL_TABLET | SUBLINGUAL | Status: DC | PRN

## 2014-05-20 MED ORDER — SODIUM CHLORIDE 0.9 % IJ SOLN: 3.0000 mL | Freq: Two times a day (BID) | INTRAMUSCULAR | Status: DC

## 2014-05-20 MED ORDER — ASPIRIN 81 MG PO CHEW
324.0000 mg | CHEWABLE_TABLET | Freq: Once | ORAL | Status: AC
Start: 2014-05-20 — End: 2014-05-20
  Administered 2014-05-20: 324 mg via ORAL
  Filled 2014-05-20: qty 4

## 2014-05-20 MED ORDER — CARVEDILOL 12.5 MG PO TABS
12.5000 mg | ORAL_TABLET | Freq: Two times a day (BID) | ORAL | Status: DC
  Filled 2014-05-20: qty 1

## 2014-05-20 MED ORDER — HYDROCHLOROTHIAZIDE 25 MG PO TABS
25.0000 mg | ORAL_TABLET | Freq: Every day | ORAL | Status: DC
  Administered 2014-05-21: 25 mg via ORAL
  Filled 2014-05-20: qty 1

## 2014-05-20 MED ORDER — AMLODIPINE BESYLATE 10 MG PO TABS
10.0000 mg | ORAL_TABLET | Freq: Every day | ORAL | Status: DC
  Administered 2014-05-21: 10 mg via ORAL
  Filled 2014-05-20: qty 1

## 2014-05-20 MED ORDER — MULTIVITAMINS PO CAPS: 1.0000 | ORAL_CAPSULE | Freq: Every day | ORAL | Status: DC

## 2014-05-20 MED ORDER — PROMETHAZINE HCL 25 MG PO TABS: 12.5000 mg | ORAL_TABLET | Freq: Four times a day (QID) | ORAL | Status: DC | PRN

## 2014-05-20 MED ORDER — DEXTROSE 50 % IV SOLN
INTRAVENOUS | Status: AC
  Filled 2014-05-20: qty 50

## 2014-05-20 MED ORDER — ASPIRIN EC 81 MG PO TBEC
81.0000 mg | DELAYED_RELEASE_TABLET | Freq: Every day | ORAL | Status: DC
  Administered 2014-05-21: 81 mg via ORAL
  Filled 2014-05-20: qty 1

## 2014-05-20 MED ORDER — DOXAZOSIN MESYLATE 4 MG PO TABS
4.0000 mg | ORAL_TABLET | Freq: Every day | ORAL | Status: DC
  Administered 2014-05-20: 4 mg via ORAL
  Filled 2014-05-20 (×2): qty 1

## 2014-05-20 MED ORDER — ALUM & MAG HYDROXIDE-SIMETH 200-200-20 MG/5ML PO SUSP: 30.0000 mL | Freq: Four times a day (QID) | ORAL | Status: DC | PRN

## 2014-05-20 MED ORDER — METOPROLOL TARTRATE 25 MG PO TABS: 25.0000 mg | ORAL_TABLET | Freq: Every day | ORAL | Status: DC

## 2014-05-20 MED ORDER — DEXTROSE-NACL 5-0.45 % IV SOLN
INTRAVENOUS | Status: DC
  Administered 2014-05-20: 50 mL via INTRAVENOUS

## 2014-05-20 MED ORDER — IRBESARTAN 150 MG PO TABS
150.0000 mg | ORAL_TABLET | Freq: Every day | ORAL | Status: DC
Start: 2014-05-21 — End: 2014-05-21
  Administered 2014-05-21: 150 mg via ORAL
  Filled 2014-05-20: qty 1

## 2014-05-20 MED ORDER — INSULIN ASPART 100 UNIT/ML ~~LOC~~ SOLN: 0.0000 [IU] | Freq: Every day | SUBCUTANEOUS | Status: DC

## 2014-05-20 MED ORDER — INSULIN ASPART 100 UNIT/ML ~~LOC~~ SOLN
0.0000 [IU] | Freq: Three times a day (TID) | SUBCUTANEOUS | Status: DC
  Administered 2014-05-21: 3 [IU] via SUBCUTANEOUS

## 2014-05-20 MED ORDER — DEXTROSE 50 % IV SOLN
INTRAVENOUS | Status: AC
  Administered 2014-05-20: 50 mL via INTRAVENOUS
  Filled 2014-05-20: qty 50

## 2014-05-20 MED ORDER — ZOLPIDEM TARTRATE 5 MG PO TABS: 5.0000 mg | ORAL_TABLET | Freq: Every evening | ORAL | Status: DC | PRN

## 2014-05-20 MED ORDER — OXYCODONE HCL 5 MG PO TABS
5.0000 mg | ORAL_TABLET | ORAL | Status: DC | PRN
  Filled 2014-05-20 (×4): qty 1

## 2014-05-20 MED ORDER — ADULT MULTIVITAMIN W/MINERALS CH
1.0000 | ORAL_TABLET | Freq: Every day | ORAL | Status: DC
  Administered 2014-05-21: 1 via ORAL
  Filled 2014-05-20: qty 1

## 2014-05-20 MED ORDER — ENOXAPARIN SODIUM 40 MG/0.4ML ~~LOC~~ SOLN: 40.0000 mg | SUBCUTANEOUS | Status: DC

## 2014-05-20 MED ORDER — ATORVASTATIN CALCIUM 40 MG PO TABS
40.0000 mg | ORAL_TABLET | Freq: Every day | ORAL | Status: DC
  Administered 2014-05-20: 40 mg via ORAL
  Filled 2014-05-20 (×2): qty 1

## 2014-05-20 MED ORDER — FUROSEMIDE 10 MG/ML IJ SOLN
40.0000 mg | Freq: Once | INTRAMUSCULAR | Status: AC
  Administered 2014-05-20: 40 mg via INTRAVENOUS
  Filled 2014-05-20: qty 4

## 2014-05-20 MED ORDER — PANTOPRAZOLE SODIUM 40 MG PO TBEC
40.0000 mg | DELAYED_RELEASE_TABLET | Freq: Every day | ORAL | Status: DC
  Administered 2014-05-21: 40 mg via ORAL
  Filled 2014-05-20: qty 1

## 2014-05-20 MED ORDER — FLEET ENEMA 7-19 GM/118ML RE ENEM
1.0000 | ENEMA | Freq: Once | RECTAL | Status: AC | PRN
  Filled 2014-05-20: qty 1

## 2014-05-20 MED ORDER — INSULIN DETEMIR 100 UNIT/ML ~~LOC~~ SOLN
20.0000 [IU] | Freq: Every day | SUBCUTANEOUS | Status: DC
  Filled 2014-05-20: qty 0.2

## 2014-05-20 MED ORDER — ALLOPURINOL 100 MG PO TABS
100.0000 mg | ORAL_TABLET | Freq: Every day | ORAL | Status: DC
  Administered 2014-05-21: 100 mg via ORAL
  Filled 2014-05-20: qty 1

## 2014-05-20 MED ORDER — NITROGLYCERIN IN D5W 200-5 MCG/ML-% IV SOLN
2.0000 ug/min | Freq: Once | INTRAVENOUS | Status: AC
  Administered 2014-05-20: 5 ug/min via INTRAVENOUS
  Filled 2014-05-20: qty 250

## 2014-05-20 MED ORDER — SENNOSIDES-DOCUSATE SODIUM 8.6-50 MG PO TABS: 1.0000 | ORAL_TABLET | Freq: Every evening | ORAL | Status: DC | PRN

## 2014-05-20 MED ORDER — DEXTROSE 50 % IV SOLN
50.0000 mL | Freq: Once | INTRAVENOUS | Status: AC
  Administered 2014-05-20 (×2): 50 mL via INTRAVENOUS

## 2014-05-20 MED ORDER — CARVEDILOL 25 MG PO TABS
25.0000 mg | ORAL_TABLET | Freq: Two times a day (BID) | ORAL | Status: DC
  Administered 2014-05-21: 25 mg via ORAL
  Filled 2014-05-20 (×3): qty 1

## 2014-05-20 NOTE — ED Notes (Signed)
Patient arrived via [facility] from Dr. Isidore Moosffice. According to EMS patients family stated he started to posture and went unresponsive. EMS obtained CBG of 28 and administered D50 1 amp and patient was very slow to respond and appeared to be postictal. He had incontinence of urine and blood in his mouth. He has crackles in his left upper lobe per EMS and patient states he can not catch his breath, arrived on NRB. VSS. EKG NSR.

## 2014-05-20 NOTE — Progress Notes (Signed)
2130 05/20/14 CBG 53; pt asymptomatic; Hypoglycemic protocol started. 1/2 cup of juice given to pt. 2145 CBG now 83. Will continue to monitor pt.

## 2014-05-20 NOTE — H&P (Signed)
Raymond Gordon is an 63 y.o. male.   Chief Complaint: had a seizure today HPI:  The patient is a 63 year old man who earlier today was in the waiting room while his wife was having a routine eye exam. He had a loss of consciousness associated with urinary incontinence, so 911 was called. When the EMS personnel evaluated him his initial blood glucose level was in the low 20 range and he was administered IV insulin and transported to the hospital for evaluation. Workup in the emergency room showed severe hypoglycemia it was difficult to correct and a chest x-ray showing pulmonary edema with mildly elevated pro BNP level. He has a long history of erratic blood glucose control and is on high dose 70/30 insulin with fixed dosing of 50 units in the morning with 10 units at lunchtime and 40 units in the p.m. He also has a history of paroxysmal atrial fibrillation for which he is on pradaxa. He has no history of significant coronary artery disease and had a cardiac catheterization procedure done in 2012 that showed no evidence of large vessel obstruction. He was admitted for further evaluation. After given diuretic treatment in the emergency room he is no longer having significant dyspnea. He has not had any cough, fever, chills, chest pain, palpitations, or decreased food and fluid intake. He has no history of fainting or seizure disorder. Past Medical History  Diagnosis Date  . Paroxysmal atrial fibrillation   . Hypertension   . Hyperlipidemia   . Obesity   . Diabetes   . Dysrhythmia     Medications Prior to Admission  Medication Sig Dispense Refill  . allopurinol (ZYLOPRIM) 100 MG tablet Take 100 mg by mouth daily.       Marland Kitchen amLODipine (NORVASC) 10 MG tablet Take 10 mg by mouth daily.       Marland Kitchen aspirin 81 MG tablet Take 81 mg by mouth daily.      Marland Kitchen atorvastatin (LIPITOR) 40 MG tablet Take 40 mg by mouth daily at 6 PM.       . dabigatran (PRADAXA) 150 MG CAPS Take 1 capsule (150 mg total) by mouth every 12  (twelve) hours.  60 capsule  6  . DIOVAN 160 MG tablet Take 160 mg by mouth 2 (two) times daily.       Marland Kitchen doxazosin (CARDURA) 4 MG tablet Take 4 mg by mouth daily.       . hydrochlorothiazide (HYDRODIURIL) 25 MG tablet Take 25 mg by mouth daily.      . metoprolol tartrate (LOPRESSOR) 25 MG tablet Take 25 mg by mouth daily.      . Multiple Vitamin (MULTIVITAMIN) capsule Take 1 capsule by mouth daily.      . pantoprazole (PROTONIX) 40 MG tablet Take 1 tablet (40 mg total) by mouth daily.  30 tablet  11  . [DISCONTINUED] carvedilol (COREG) 25 MG tablet Take 37.5 mg by mouth 2 (two) times daily with a meal.      . [DISCONTINUED] FARXIGA 10 MG TABS Take 10 mg by mouth daily.       . [DISCONTINUED] insulin NPH-regular Human (NOVOLIN 70/30) (70-30) 100 UNIT/ML injection Inject into the skin. 70 units in the morning, 50 units at lunch, and 50 units at night      . [DISCONTINUED] OVER THE COUNTER MEDICATION Take 1 capsule by mouth daily. Mega Red        ADDITIONAL HOME MEDICATIONS: No additional home medications  PHYSICIANS INVOLVED IN CARE: Programme researcher, broadcasting/film/video (  PCP), Quay Burow (card)  Past Surgical History  Procedure Laterality Date  . Cardiac catheterization    . Cardiac catheterization N/A 2012    no large vessel occlusions    History reviewed. No pertinent family history.   Social History:  reports that he quit smoking about 16 years ago. He does not have any smokeless tobacco history on file. His alcohol and drug histories are not on file.  Allergies: No Known Allergies   ROS: ankle swelling, diabetes, heart palpitation, high blood pressure and Hyperlipidemia, gout, paroxysmal atrial fibrillation, anxiety this obesity, left ventricular hypertrophy, retinopathy, elevated PSA level, chronic kidney disease  PHYSICAL EXAM: Blood pressure 158/43, pulse 60, temperature 97.6 F (36.4 C), temperature source Oral, resp. rate 20, height 5' 4"  (1.626 m), weight 123.1 kg (271 lb 6.2 oz), SpO2  93.00%. In general, the patient is a overweight black man who was in no apparent distress while lying partially upright in bed on room air. HEENT exam was significant for a small oropharynx. Neck was supple and without jugular venous distention or carotid bruit, chest had minimal bibasilar crackles without wheezing or use of accessory muscles of respiration, abdomen had normal bowel sounds no hepatosplenomegaly or tenderness, he had bilateral 2+ brawny edema of the legs and bilateral 1+ pedal pulses. He was alert and well oriented with normal affect and could move all extremities well. He had intact light touch sensation in both feet no skin breakdown.  Results for orders placed during the hospital encounter of 05/20/14 (from the past 48 hour(s))  CBG MONITORING, ED     Status: None   Collection Time    05/20/14 12:58 PM      Result Value Ref Range   Glucose-Capillary 70  70 - 99 mg/dL   Comment 1 Documented in Chart     Comment 2 Notify RN    CBC WITH DIFFERENTIAL     Status: Abnormal   Collection Time    05/20/14  1:09 PM      Result Value Ref Range   WBC 7.1  4.0 - 10.5 K/uL   RBC 4.52  4.22 - 5.81 MIL/uL   Hemoglobin 11.4 (*) 13.0 - 17.0 g/dL   HCT 37.1 (*) 39.0 - 52.0 %   MCV 82.1  78.0 - 100.0 fL   MCH 25.2 (*) 26.0 - 34.0 pg   MCHC 30.7  30.0 - 36.0 g/dL   RDW 14.8  11.5 - 15.5 %   Platelets 233  150 - 400 K/uL   Neutrophils Relative % 72  43 - 77 %   Neutro Abs 5.1  1.7 - 7.7 K/uL   Lymphocytes Relative 20  12 - 46 %   Lymphs Abs 1.4  0.7 - 4.0 K/uL   Monocytes Relative 7  3 - 12 %   Monocytes Absolute 0.5  0.1 - 1.0 K/uL   Eosinophils Relative 1  0 - 5 %   Eosinophils Absolute 0.1  0.0 - 0.7 K/uL   Basophils Relative 0  0 - 1 %   Basophils Absolute 0.0  0.0 - 0.1 K/uL  COMPREHENSIVE METABOLIC PANEL     Status: Abnormal   Collection Time    05/20/14  1:09 PM      Result Value Ref Range   Sodium 140  137 - 147 mEq/L   Potassium 3.9  3.7 - 5.3 mEq/L   Chloride 101  96 -  112 mEq/L   CO2 22  19 - 32 mEq/L  Glucose, Bld 62 (*) 70 - 99 mg/dL   BUN 12  6 - 23 mg/dL   Creatinine, Ser 0.88  0.50 - 1.35 mg/dL   Calcium 10.0  8.4 - 10.5 mg/dL   Total Protein 7.8  6.0 - 8.3 g/dL   Albumin 3.6  3.5 - 5.2 g/dL   AST 30  0 - 37 U/L   ALT 24  0 - 53 U/L   Alkaline Phosphatase 99  39 - 117 U/L   Total Bilirubin 0.3  0.3 - 1.2 mg/dL   GFR calc non Af Amer 90 (*) >90 mL/min   GFR calc Af Amer >90  >90 mL/min   Comment: (NOTE)     The eGFR has been calculated using the CKD EPI equation.     This calculation has not been validated in all clinical situations.     eGFR's persistently <90 mL/min signify possible Chronic Kidney     Disease.  PROTIME-INR     Status: Abnormal   Collection Time    05/20/14  1:09 PM      Result Value Ref Range   Prothrombin Time 18.1 (*) 11.6 - 15.2 seconds   INR 1.50 (*) 0.00 - 1.49  TROPONIN I     Status: None   Collection Time    05/20/14  1:09 PM      Result Value Ref Range   Troponin I <0.30  <0.30 ng/mL   Comment:            Due to the release kinetics of cTnI,     a negative result within the first hours     of the onset of symptoms does not rule out     myocardial infarction with certainty.     If myocardial infarction is still suspected,     repeat the test at appropriate intervals.  PRO B NATRIURETIC PEPTIDE     Status: Abnormal   Collection Time    05/20/14  1:09 PM      Result Value Ref Range   Pro B Natriuretic peptide (BNP) 302.3 (*) 0 - 125 pg/mL  I-STAT CG4 LACTIC ACID, ED     Status: None   Collection Time    05/20/14  1:25 PM      Result Value Ref Range   Lactic Acid, Venous 1.02  0.5 - 2.2 mmol/L  CBG MONITORING, ED     Status: Abnormal   Collection Time    05/20/14  1:42 PM      Result Value Ref Range   Glucose-Capillary 52 (*) 70 - 99 mg/dL   Comment 1 Documented in Chart     Comment 2 Notify RN    I-STAT ARTERIAL BLOOD GAS, ED     Status: Abnormal   Collection Time    05/20/14  2:00 PM       Result Value Ref Range   pH, Arterial 7.358  7.350 - 7.450   pCO2 arterial 39.1  35.0 - 45.0 mmHg   pO2, Arterial 80.0  80.0 - 100.0 mmHg   Bicarbonate 22.0  20.0 - 24.0 mEq/L   TCO2 23  0 - 100 mmol/L   O2 Saturation 95.0     Acid-base deficit 3.0 (*) 0.0 - 2.0 mmol/L   Collection site RADIAL, ALLEN'S TEST ACCEPTABLE     Drawn by RT     Sample type ARTERIAL    CBG MONITORING, ED     Status: Abnormal   Collection Time  05/20/14  2:05 PM      Result Value Ref Range   Glucose-Capillary 114 (*) 70 - 99 mg/dL   Comment 1 Documented in Chart     Comment 2 Notify RN    CBG MONITORING, ED     Status: None   Collection Time    05/20/14  3:35 PM      Result Value Ref Range   Glucose-Capillary 72  70 - 99 mg/dL   Comment 1 Documented in Chart     Comment 2 Notify RN    CBG MONITORING, ED     Status: None   Collection Time    05/20/14  3:51 PM      Result Value Ref Range   Glucose-Capillary 74  70 - 99 mg/dL   Comment 1 Documented in Chart     Comment 2 Notify RN    CBG MONITORING, ED     Status: Abnormal   Collection Time    05/20/14  5:33 PM      Result Value Ref Range   Glucose-Capillary 61 (*) 70 - 99 mg/dL   Comment 1 Documented in Chart     Comment 2 Notify RN     Dg Chest Portable 1 View  05/20/2014   CLINICAL DATA:  ALTERED MENTAL STATUS LOSS OF CONSCIOUSNESS  EXAM: PORTABLE CHEST - 1 VIEW  COMPARISON:  Portable chest radiograph 07/17/2011  FINDINGS: Low lung volumes. Cardiomegaly, stable. Diffuse prominence of interstitial markings, diffuse pulmonary opacities, indistinctness of the pulmonary vasculature, and areas of peribronchial cuffing. No focal regions of consolidation or focal infiltrates. No acute osseous abnormalities.  IMPRESSION: Pulmonary edema.   Electronically Signed   By: Margaree Mackintosh M.D.   On: 05/20/2014 13:26     Assessment/Plan #1 Seizure: He had urinary incontinence and evidence of biting his tongue with his episode earlier today is consistent with a  seizure from severe hypoglycemia. His neurological exam is currently nonfocal and normal. At this time no further workup for his seizures indicated. #2 Dyspnea: Most likely from acute mild congestive heart failure related to his diabetes and diastolic dysfunction. He appears at his baseline now with no dyspnea on room air. His BNP level was not particularly elevated. We will give him gentle IV fluids and recheck his labs including a pro BNP level tomorrow morning. We will check serial cardiac isoenzymes and arrange for him to have an expeditious echocardiogram. #3 DM2: he has erratic blood glucose control and may need further teaching with our CDE as an outpatient. #4 HTN: BP is somewhat elevated and meds will be adjusted. #5 Fatigue: likely due to obstructive sleep apnea given a history of chronic somnolence with snoring and we will consider getting him a sleep test as an outpatient.  Kristi Hyer G 05/20/2014, 7:45 PM

## 2014-05-20 NOTE — ED Provider Notes (Signed)
CSN: [id number]     Arrival date & time 05/20/14  1248 History   First MD Initiated Contact with Patient 05/20/14 1303     Chief Complaint  Patient presents with  . Altered Mental Status  . Loss of Consciousness     (Consider location/radiation/quality/duration/timing/severity/associated sxs/prior Treatment) HPI Comments: Patient from PCPs office with syncopal episode versus seizure. He was sitting in the waiting room when he began to feel dizzy and lightheaded and was found slumped over in a chair. He was diaphoretic and incontinent of urine with questionable seizure activity. He was found to be hypoglycemic in the 20s. He was given IV dextrose and sugar increased to 70. He is a diabetic on insulin and pills. He denies any chest pain, abdominal pain, nausea vomiting or fever. No previous history of seizures.  He has atrial fibrillation and is on pradaxa.  Denies hitting head.  The history is provided by the patient.    Past Medical History  Diagnosis Date  . Paroxysmal atrial fibrillation   . Hypertension   . Hyperlipidemia   . Obesity   . Diabetes   . Dysrhythmia    Past Surgical History  Procedure Laterality Date  . Cardiac catheterization    . Cardiac catheterization N/A 2012    no large vessel occlusions   History reviewed. No pertinent family history. History  Substance Use Topics  . Smoking status: Former Smoker    Quit date: 11/25/1997  . Smokeless tobacco: Not on file  . Alcohol Use: Not on file    Review of Systems  Unable to perform ROS: Mental status change  Cardiovascular: Positive for syncope.      Allergies  Review of patient's allergies indicates no known allergies.  Home Medications   Prior to Admission medications   Medication Sig Start Date End Date Taking? Authorizing Provider  allopurinol (ZYLOPRIM) 100 MG tablet Take 100 mg by mouth daily.  10/04/13  Yes Historical Provider, MD  amLODipine (NORVASC) 10 MG tablet Take 10 mg by mouth daily.   10/12/13  Yes Historical Provider, MD  aspirin 81 MG tablet Take 81 mg by mouth daily.   Yes Historical Provider, MD  atorvastatin (LIPITOR) 40 MG tablet Take 40 mg by mouth daily at 6 PM.  11/02/13  Yes Historical Provider, MD  dabigatran (PRADAXA) 150 MG CAPS Take 1 capsule (150 mg total) by mouth every 12 (twelve) hours. 04/08/13  Yes Runell GessJonathan J Berry, MD  DIOVAN 160 MG tablet Take 160 mg by mouth 2 (two) times daily.  10/04/13  Yes Historical Provider, MD  doxazosin (CARDURA) 4 MG tablet Take 4 mg by mouth daily.  11/04/13  Yes Historical Provider, MD  hydrochlorothiazide (HYDRODIURIL) 25 MG tablet Take 25 mg by mouth daily.   Yes Historical Provider, MD  metoprolol tartrate (LOPRESSOR) 25 MG tablet Take 25 mg by mouth daily.   Yes Historical Provider, MD  Multiple Vitamin (MULTIVITAMIN) capsule Take 1 capsule by mouth daily.   Yes Historical Provider, MD  pantoprazole (PROTONIX) 40 MG tablet Take 1 tablet (40 mg total) by mouth daily. 12/06/13  Yes Runell GessJonathan J Berry, MD   BP 158/43  Pulse 60  Temp(Src) 98.8 F (37.1 C) (Oral)  Resp 20  Ht 5\' 4"  (1.626 m)  Wt 271 lb 6.2 oz (123.1 kg)  BMI 46.56 kg/m2  SpO2 93% Physical Exam  Nursing note and vitals reviewed. Constitutional: He is oriented to person, place, and time. He appears well-developed and well-nourished. No distress.  Rales  with frothy sputum  HENT:  Head: Normocephalic and atraumatic.  Mouth/Throat: Oropharynx is clear and moist. No oropharyngeal exudate.  Eyes: Conjunctivae and EOM are normal. Pupils are equal, round, and reactive to light.  Neck: Normal range of motion. Neck supple.  No meningismus.  Cardiovascular: Normal rate, regular rhythm, normal heart sounds and intact distal pulses.   No murmur heard. Pulmonary/Chest: Effort normal. No respiratory distress. He has rales.  Rales diffusely with increased work of breathing  Abdominal: Soft. There is no tenderness. There is no rebound and no guarding.   Musculoskeletal: He exhibits edema. He exhibits no tenderness.  Pitting edema to the thighs bilaterally  Neurological: He is alert and oriented to person, place, and time. No cranial nerve deficit. He exhibits normal muscle tone. Coordination normal.  No ataxia on finger to nose bilaterally. No pronator drift. 5/5 strength throughout. CN 2-12 intact. Negative Romberg. Equal grip strength. Sensation intact. Gait is normal.   Skin: Skin is warm.  Psychiatric: He has a normal mood and affect. His behavior is normal.    ED Course  Procedures (including critical care time) Labs Review Labs Reviewed  CBC WITH DIFFERENTIAL - Abnormal; Notable for the following:    Hemoglobin 11.4 (*)    HCT 37.1 (*)    MCH 25.2 (*)    All other components within normal limits  COMPREHENSIVE METABOLIC PANEL - Abnormal; Notable for the following:    Glucose, Bld 62 (*)    GFR calc non Af Amer 90 (*)    All other components within normal limits  PROTIME-INR - Abnormal; Notable for the following:    Prothrombin Time 18.1 (*)    INR 1.50 (*)    All other components within normal limits  PRO B NATRIURETIC PEPTIDE - Abnormal; Notable for the following:    Pro B Natriuretic peptide (BNP) 302.3 (*)    All other components within normal limits  I-STAT ARTERIAL BLOOD GAS, ED - Abnormal; Notable for the following:    Acid-base deficit 3.0 (*)    All other components within normal limits  CBG MONITORING, ED - Abnormal; Notable for the following:    Glucose-Capillary 52 (*)    All other components within normal limits  CBG MONITORING, ED - Abnormal; Notable for the following:    Glucose-Capillary 114 (*)    All other components within normal limits  CBG MONITORING, ED - Abnormal; Notable for the following:    Glucose-Capillary 61 (*)    All other components within normal limits  MRSA PCR SCREENING  TROPONIN I  COMPREHENSIVE METABOLIC PANEL  CBC  HEMOGLOBIN A1C  TROPONIN I  TROPONIN I  TROPONIN I  CBG  MONITORING, ED  I-STAT CG4 LACTIC ACID, ED  CBG MONITORING, ED  CBG MONITORING, ED  CBG MONITORING, ED  CBG MONITORING, ED  CBG MONITORING, ED  CBG MONITORING, ED  CBG MONITORING, ED  CBG MONITORING, ED  CBG MONITORING, ED  CBG MONITORING, ED  CBG MONITORING, ED  CBG MONITORING, ED  CBG MONITORING, ED  CBG MONITORING, ED  CBG MONITORING, ED  CBG MONITORING, ED  CBG MONITORING, ED    Imaging Review Dg Chest Portable 1 View  05/20/2014   CLINICAL DATA:  ALTERED MENTAL STATUS LOSS OF CONSCIOUSNESS  EXAM: PORTABLE CHEST - 1 VIEW  COMPARISON:  Portable chest radiograph 07/17/2011  FINDINGS: Low lung volumes. Cardiomegaly, stable. Diffuse prominence of interstitial markings, diffuse pulmonary opacities, indistinctness of the pulmonary vasculature, and areas of peribronchial cuffing. No focal  regions of consolidation or focal infiltrates. No acute osseous abnormalities.  IMPRESSION: Pulmonary edema.   Electronically Signed   By: Salome Holmes M.D.   On: 05/20/2014 13:26     EKG Interpretation   Date/Time:  Friday May 20 2014 12:50:31 EDT Ventricular Rate:  67 PR Interval:  122 QRS Duration: 108 QT Interval:  430 QTC Calculation: 454 R Axis:   41 Text Interpretation:  Sinus rhythm Probable left atrial enlargement  Abnormal R-wave progression, early transition Probable inferior infarct,  age indeterminate No significant change was found Confirmed by Manus Gunning   MD, Damarys Speir [id number]) on 05/20/2014 1:31:41 PM      MDM   Final diagnoses:  Acute pulmonary edema  Hypoglycemia  Syncope, unspecified syncope type   Syncopal episode versus seizure while waiting at doctor's office with hypoglycemia.  Awake and alert on arrival.  Oriented x3.  nonfocal neuro exam. CBG 70.  EKG with nsr, no acute ST changes. Rales with frothy sputum, CXR with pulmonary edema.  bipap started. ABG without CO2 retention. CT head negative. Suspect seizure or syncope due to hypoglycemia.  IV dextrose  given.  Persistent hypoglycemia in the ED and gentle d5 started.  Lasix given with improvement in work of breathing and bipap weaned.  Admission d/w Dr. Eloise Harman, covering for Dr. Wylene Simmer.  CRITICAL CARE Performed by: Glynn Octave Total critical care time: 30 Critical care time was exclusive of separately billable procedures and treating other patients. Critical care was necessary to treat or prevent imminent or life-threatening deterioration. Critical care was time spent personally by me on the following activities: development of treatment plan with patient and/or surrogate as well as nursing, discussions with consultants, evaluation of patient's response to treatment, examination of patient, obtaining history from patient or surrogate, ordering and performing treatments and interventions, ordering and review of laboratory studies, ordering and review of radiographic studies, pulse oximetry and re-evaluation of patient's condition.    Glynn Octave, MD 05/21/14 [phone]

## 2014-05-21 LAB — COMPREHENSIVE METABOLIC PANEL
ALBUMIN: 3.1 g/dL — AB (ref 3.5–5.2)
ALT: 21 U/L (ref 0–53)
AST: 24 U/L (ref 0–37)
Alkaline Phosphatase: 89 U/L (ref 39–117)
BUN: 11 mg/dL (ref 6–23)
CO2: 21 mEq/L (ref 19–32)
Calcium: 9.2 mg/dL (ref 8.4–10.5)
Chloride: 103 mEq/L (ref 96–112)
Creatinine, Ser: 0.84 mg/dL (ref 0.50–1.35)
GFR calc Af Amer: >90 mL/min (ref >90)
GFR calc non Af Amer: >90 mL/min (ref >90)
GLUCOSE: 131 mg/dL — AB (ref 70–99)
POTASSIUM: 3.6 meq/L — AB (ref 3.7–5.3)
Sodium: 139 mEq/L (ref 137–147)
Total Bilirubin: 0.3 mg/dL (ref 0.3–1.2)
Total Protein: 7.3 g/dL (ref 6.0–8.3)

## 2014-05-21 LAB — TROPONIN I
Troponin I: <0.3 ng/mL (ref <0.30)
Troponin I: <0.3 ng/mL (ref <0.30)

## 2014-05-21 LAB — GLUCOSE, CAPILLARY
GLUCOSE-CAPILLARY: 109 mg/dL — AB (ref 70–99)
Glucose-Capillary: 82 mg/dL (ref 70–99)
Glucose-Capillary: 134 mg/dL — ABNORMAL HIGH (ref 70–99)

## 2014-05-21 LAB — CBC
HCT: 34.6 % — ABNORMAL LOW (ref 39.0–52.0)
Hemoglobin: 10.8 g/dL — ABNORMAL LOW (ref 13.0–17.0)
MCH: 25.5 pg — AB (ref 26.0–34.0)
MCHC: 31.2 g/dL (ref 30.0–36.0)
MCV: 81.6 fL (ref 78.0–100.0)
Platelets: 219 10*3/uL (ref 150–400)
RBC: 4.24 MIL/uL (ref 4.22–5.81)
RDW: 14.9 % (ref 11.5–15.5)
WBC: 6.6 10*3/uL (ref 4.0–10.5)

## 2014-05-21 LAB — HEMOGLOBIN A1C
Hgb A1c MFr Bld: 8.1 % — ABNORMAL HIGH (ref <5.7)
Mean Plasma Glucose: 186 mg/dL — ABNORMAL HIGH (ref <117)

## 2014-05-21 MED ORDER — CARVEDILOL 25 MG PO TABS: 25.0000 mg | ORAL_TABLET | Freq: Two times a day (BID) | ORAL | Status: DC

## 2014-05-21 MED ORDER — DAPAGLIFLOZIN PROPANEDIOL 10 MG PO TABS: 10.0000 mg | ORAL_TABLET | Freq: Every day | ORAL | Status: DC

## 2014-05-21 MED ORDER — INSULIN NPH ISOPHANE & REGULAR (70-30) 100 UNIT/ML ~~LOC~~ SUSP: SUBCUTANEOUS | Status: DC

## 2014-05-21 NOTE — Discharge Summary (Addendum)
Physician Discharge Summary  Patient ID: Raymond Gordon MRN: [id number] DOB/AGE: 64/05/1951 64 y.o.  Admit date: 05/20/2014 Discharge date: 05/21/2014   Discharge Diagnoses:  Principal Problem:   Pulmonary edema Active Problems:   Diabetes   Hypoglycemia   Syncope and collapse   Paroxysmal atrial fibrillation   Morbid obesity   Essential hypertension   Hyperlipidemia   CHF (congestive heart failure), NYHA class I   Discharged Condition: good  Hospital Course: The patient is a 63 year old man who earlier today was in the waiting room while his wife was having a routine eye exam. He had a loss of consciousness associated with urinary incontinence, so 911 was called. When the EMS personnel evaluated him his initial blood glucose level was in the low 20 range and he was administered IV insulin and transported to the hospital for evaluation. Workup in the emergency room showed severe hypoglycemia it was difficult to correct and a chest x-ray showing pulmonary edema with mildly elevated pro BNP level. He has a long history of erratic blood glucose control and is on high dose 70/30 insulin with fixed dosing of 50 units in the morning with 10 units at lunchtime and 40 units in the p.m. He also has a history of paroxysmal atrial fibrillation for which he is on pradaxa. He has no history of significant coronary artery disease and had a cardiac catheterization procedure done in 2012 that showed no evidence of large vessel obstruction. He was admitted for further evaluation. After given diuretic treatment in the emergency room he is no longer having significant dyspnea. He has not had any cough, fever, chills, chest pain, palpitations, or decreased food and fluid intake. He has no history of fainting or seizure disorder.  He was admitted to a medical bed with telemetry and did well overnight with no problems with shortness of breath or chest discomfort despite only receiving one dose of IV diuretics in the  emergency room. He was initially treated with IV nitroglycerin that was associated with hypotension, so this was discontinued. He had serial cardiac isoenzymes drawn that were normal. In the morning he had no dyspnea while lying supine in bed, and had slept well. He had an occasional dry cough without chest discomfort palpitations. His appetite was good and he was able to ambulate without difficulty. He had an echocardiogram done on the morning of discharge with results pending at the time of discharge. His blood glucose levels have been stable overnight with a change to Levemir insulin and NovoLog sliding scale insulin as needed.    Consults: None  Significant Diagnostic Studies:  Ct Head Wo Contrast  05/20/2014   CLINICAL DATA:  Seizure.  EXAM: CT HEAD WITHOUT CONTRAST  TECHNIQUE: Contiguous axial images were obtained from the base of the skull through the vertex without intravenous contrast.  COMPARISON:  None.  FINDINGS: Bony calvarium appears intact. No mass effect or midline shift is noted. Ventricular size is within normal limits. There is no evidence of mass lesion, hemorrhage or acute infarction.  IMPRESSION: Normal head CT.   Electronically Signed   By: Raymond LiasJames  Gordon M.D.   On: 05/20/2014 23:41   Dg Chest Portable 1 View  05/20/2014   CLINICAL DATA:  ALTERED MENTAL STATUS LOSS OF CONSCIOUSNESS  EXAM: PORTABLE CHEST - 1 VIEW  COMPARISON:  Portable chest radiograph 07/17/2011  FINDINGS: Low lung volumes. Cardiomegaly, stable. Diffuse prominence of interstitial markings, diffuse pulmonary opacities, indistinctness of the pulmonary vasculature, and areas of peribronchial cuffing. No focal  regions of consolidation or focal infiltrates. No acute osseous abnormalities.  IMPRESSION: Pulmonary edema.   Electronically Signed   By: Raymond HolmesHector  Gordon M.D.   On: 05/20/2014 13:26    Labs: Lab Results  Component Value Date   WBC 6.6 05/21/2014   HGB 10.8* 05/21/2014   HCT 34.6* 05/21/2014   MCV 81.6 05/21/2014    PLT 219 05/21/2014      Recent Labs Lab 05/21/14 0241  NA 139  K 3.6*  CL 103  CO2 21  BUN 11  CREATININE 0.84  CALCIUM 9.2  PROT 7.3  BILITOT 0.3  ALKPHOS 89  ALT 21  AST 24  GLUCOSE 131*    05/21/2014 12-lead EKG results: Normal sinus rhythm, within normal limits  Serial troponin I levels were less than 0.30, less than 0.30, and less than 0.30  An initial pro-BNP level was 302 (0-125)  05/21/2014 transthoracic echocardiogram results: Pending at the time of discharge   Lab Results  Component Value Date   INR 1.50* 05/20/2014   INR 0.97 07/17/2011     Recent Results (from the past 240 hour(s))  MRSA PCR SCREENING     Status: None   Collection Time    05/20/14  6:24 PM      Result Value Ref Range Status   MRSA by PCR NEGATIVE  NEGATIVE Final   Comment:            The GeneXpert MRSA Assay (FDA     approved for NASAL specimens     only), is one component of a     comprehensive MRSA colonization     surveillance program. It is not     intended to diagnose MRSA     infection nor to guide or     monitor treatment for     MRSA infections.      Discharge Exam: Blood pressure 113/20, pulse 73, temperature 98.3 F (36.8 C), temperature source Oral, resp. rate 35, height 5\' 4"  (1.626 m), weight 123.1 kg (271 lb 6.2 oz), SpO2 95.00%.  Physical Exam: In general, the patient is an overweight African American man who was in no apparent distress while lying supine in bed on nasal cannula oxygen at 2 L per minute. HEENT exam was significant for small oral pharynx, neck was supple without jugular venous distention or carotid bruit, chest was clear to auscultation, heart had a regular rate and rhythm and was without significant murmur or gallop, abdomen had normal bowel sounds no hepatosplenomegaly or tenderness, he had bilateral 2+ brawny edema of the legs and bilateral 1+ pedal pulses. He had intact light sensation in both feet. He was alert and well oriented with normal  affect and could move all extremities well.  Disposition:  He will be discharged to home today in the company of his wife. He should have a followup visit with Dr. Wylene Gordon within one week of hospital discharge.      Discharge Instructions   Call MD for:    Complete by:  As directed   Call physician for difficulty breathing, chest pain, weight change of 3 pounds from todays weight, or any other concerning symptoms. Do not skip lunch as could have a significant low sugar episode from insulin given in the morning, carry glucose tablets with you to rapidly normalize sugar levels if reading is less than 70.     Diet - low sodium heart healthy    Complete by:  As directed      Discharge  instructions    Complete by:  As directed   Check body weights every day, and blood sugars before each meal, bring records of weights and sugar readings to your followup visit with Dr. Wylene Simmer     Increase activity slowly    Complete by:  As directed             Medication List    STOP taking these medications       OVER THE COUNTER MEDICATION      TAKE these medications       allopurinol 100 MG tablet  Commonly known as:  ZYLOPRIM  Take 100 mg by mouth daily.     amLODipine 10 MG tablet  Commonly known as:  NORVASC  Take 10 mg by mouth daily.     aspirin 81 MG tablet  Take 81 mg by mouth daily.     atorvastatin 40 MG tablet  Commonly known as:  LIPITOR  Take 40 mg by mouth daily at 6 PM.     carvedilol 25 MG tablet  Commonly known as:  COREG  Take 1 tablet (25 mg total) by mouth 2 (two) times daily with a meal.     dabigatran 150 MG Caps capsule  Commonly known as:  PRADAXA  Take 1 capsule (150 mg total) by mouth every 12 (twelve) hours.     Dapagliflozin Propanediol 10 MG Tabs  Commonly known as:  FARXIGA  Take 10 mg by mouth daily.     DIOVAN 160 MG tablet  Generic drug:  valsartan  Take 160 mg by mouth 2 (two) times daily.     doxazosin 4 MG tablet  Commonly known as:   CARDURA  Take 4 mg by mouth daily.     hydrochlorothiazide 25 MG tablet  Commonly known as:  HYDRODIURIL  Take 25 mg by mouth daily.     insulin NPH-regular Human (70-30) 100 UNIT/ML injection  Commonly known as:  NOVOLIN 70/30  60 units in the morning, 50 units at lunch, and 50 units at night     metoprolol tartrate 25 MG tablet  Commonly known as:  LOPRESSOR  Take 25 mg by mouth daily.     multivitamin capsule  Take 1 capsule by mouth daily.     pantoprazole 40 MG tablet  Commonly known as:  PROTONIX  Take 1 tablet (40 mg total) by mouth daily.       Follow-up Information   Follow up with Gaspar Garbe, MD. Schedule an appointment as soon as possible for a visit in 1 week.   Specialty:  Internal Medicine   Contact information:   [address] [name] [address] [phone]       Signed: Garlan Fillers 05/21/2014, 9:38 AM

## 2014-05-21 NOTE — Progress Notes (Signed)
Utilization Review Completed.Raymond Gordon, Raymond Gordon T6/27/2015

## 2014-05-21 NOTE — Discharge Instructions (Signed)
Information on my medicine - Pradaxa® (dabigatran) ° °Why was Pradaxa® prescribed for you? °Pradaxa® was prescribed for you to reduce the risk of forming blood clots that cause a stroke if you have a medical condition called atrial fibrillation (a type of irregular heartbeat).   ° °What do you Need to know about PradAXa®? °Take your Pradaxa® TWICE DAILY - one capsule in the morning and one tablet in the evening with or without food.  It would be best to take the doses about the same time each day. ° °The capsules should not be broken, chewed or opened - they must be swallowed whole. ° °Do not store Pradaxa in other medication containers - once the bottle is opened the Pradaxa should be used within FOUR months; throw away any capsules that haven’t been by that time. ° °Take Pradaxa® exactly as prescribed by your doctor.  DO NOT stop taking Pradaxa® without talking to the doctor who prescribed the medication.  Stopping without other stroke prevention medication to take the place of Pradaxa may increase your risk of developing a clot that causes a stroke.  Refill your prescription before you run out. ° °After discharge, you should have regular check-up appointments with your healthcare provider that is prescribing your Pradaxa®.  In the future your dose may need to be changed if your kidney function or weight changes by a significant amount. ° °What do you do if you miss a dose? °If you miss a dose, take it as soon as you remember on the same day.  If your next dose is less than 6 hours away, skip the missed dose.  Do not take two doses of PRADAXA at the same time. ° °Important Safety Information °A possible side effect of Pradaxa® is bleeding. You should call your healthcare provider right away if you experience any of the following: °  Bleeding from an injury or your nose that does not stop. °  Unusual colored urine (red or dark brown) or unusual colored stools (red or black). °  Unusual bruising for unknown  reasons. °  A serious fall or if you hit your head (even if there is no bleeding). ° °Some medicines may interact with Pradaxa® and might increase your risk of bleeding or clotting while on Pradaxa®. To help avoid this, consult your healthcare provider or pharmacist prior to using any new prescription or non-prescription medications, including herbals, vitamins, non-steroidal anti-inflammatory drugs (NSAIDs) and supplements. ° °This website has more information on Pradaxa® (dabigatran): [email or web address].com. °

## 2014-07-08 ENCOUNTER — Ambulatory Visit (INDEPENDENT_AMBULATORY_CARE_PROVIDER_SITE_OTHER): Payer: 59 | Admitting: Podiatrist

## 2014-07-08 ENCOUNTER — Encounter: Payer: Self-pay | Admitting: Podiatrist

## 2014-07-08 VITALS — BP 116/59 | HR 60 | Resp 16

## 2014-07-08 DIAGNOSIS — L03039 Cellulitis of unspecified toe: Secondary | ICD-10-CM

## 2014-07-08 DIAGNOSIS — M79609 Pain in unspecified limb: Secondary | ICD-10-CM

## 2014-07-08 DIAGNOSIS — M79673 Pain in unspecified foot: Secondary | ICD-10-CM

## 2014-07-08 DIAGNOSIS — B351 Tinea unguium: Secondary | ICD-10-CM

## 2014-07-08 NOTE — Progress Notes (Signed)
Subjective:  Patient presents today for follow up of paronychia of the left great toenail performed 2 months ago.  He relates it is doing well and has not been draining.  States there is a small scab at the tip of the toe and he just wanted it checked out.    Physical Exam   Pedal pulses faintly palpable at 1/4 dp and pt. He has lymphedema and wears compressive support hose. Normal proximal to distal temperature gradient. Neurological sensation intact protectively and epicritically. The nails on the left foot are minimally elongated. The left hallux nail appears to have healed well with no sign of  Redness, swelling or sign of infection.  The nail is minimally long  Assessment:  Healed paronychia and minimally elongated toenails  Plan:  Debridement of hallux nail accomplished today.  He will be seen back in 8 weeks for routine care.

## 2014-09-09 ENCOUNTER — Ambulatory Visit: Payer: 59 | Admitting: Podiatrist

## 2014-09-30 ENCOUNTER — Ambulatory Visit: Payer: 59 | Admitting: Podiatrist

## 2014-10-05 ENCOUNTER — Ambulatory Visit (INDEPENDENT_AMBULATORY_CARE_PROVIDER_SITE_OTHER): Payer: 59 | Admitting: Podiatrist

## 2014-10-05 ENCOUNTER — Encounter: Payer: Self-pay | Admitting: Podiatrist

## 2014-10-05 VITALS — BP 131/57 | HR 62 | Resp 12

## 2014-10-05 DIAGNOSIS — M79673 Pain in unspecified foot: Secondary | ICD-10-CM

## 2014-10-05 DIAGNOSIS — B351 Tinea unguium: Secondary | ICD-10-CM

## 2014-10-09 NOTE — Progress Notes (Signed)
Subjective:  Patient presents today for follow up of paronychia of the left great toenail -- he states it is significantly improved with the second round of antibiotics taken. Relates mild tenderness in the corners.    Physical Exam   Pedal pulses faintly palpable at 1/4 dp and pt. He has lymphedema and wears compressive support hose. Normal proximal to distal temperature gradient. Neurological sensation intact protectively and epicritically. The nails on the left foot are minimally elongated. The left hallux nail appears to have healed well with no sign of  Redness, swelling or sign of infection.    Assessment:  Healed paronychia    Plan: the paronychia is healed well.   He will be seen back in 8 weeks for routine care.

## 2014-11-09 ENCOUNTER — Telehealth: Payer: Self-pay

## 2014-11-09 MED ORDER — OMEPRAZOLE MAGNESIUM 20 MG PO TBEC: 20.0000 mg | DELAYED_RELEASE_TABLET | Freq: Every day | ORAL | Status: DC

## 2014-11-09 NOTE — Telephone Encounter (Signed)
Received prior authorization request for patient's Pantoprazole. Spoke with Phillips HayKristin Alvstad, PharmD, she advised to change to Omeprazole OTC Called patient and notified patient. Patient understood and agreed with plan. Reminded him of appointment in January with Dr Allyson SabalBerry.

## 2014-12-09 ENCOUNTER — Ambulatory Visit (INDEPENDENT_AMBULATORY_CARE_PROVIDER_SITE_OTHER): Payer: 59 | Admitting: Cardiovascular Disease

## 2014-12-09 ENCOUNTER — Encounter: Payer: Self-pay | Admitting: Cardiovascular Disease

## 2014-12-09 VITALS — BP 124/58 | HR 76 | Ht 64.0 in | Wt 266.0 lb

## 2014-12-09 DIAGNOSIS — R0683 Snoring: Secondary | ICD-10-CM

## 2014-12-09 DIAGNOSIS — I10 Essential (primary) hypertension: Secondary | ICD-10-CM

## 2014-12-09 DIAGNOSIS — E785 Hyperlipidemia, unspecified: Secondary | ICD-10-CM

## 2014-12-09 DIAGNOSIS — G4719 Other hypersomnia: Secondary | ICD-10-CM

## 2014-12-09 NOTE — Assessment & Plan Note (Signed)
Maintaining sinus rhythm on beta blocker and Pradaxa

## 2014-12-09 NOTE — Assessment & Plan Note (Signed)
History of hyperlipidemia on atorvastatin 40 mg a day. This is followed by his PCP.

## 2014-12-09 NOTE — Assessment & Plan Note (Signed)
History of hypertension and blood pressure measured at 124/58. He is on amlodipine, carvedilol, Diovan, hydrochlorothiazide and metoprolol. We will review his current med rec and determine why he is on 2 beta blockers. Otherwise, continue current meds at current dosing

## 2014-12-09 NOTE — Progress Notes (Signed)
12/09/2014 Raymond Gordon   04/23/1951  [id number]  Primary Physician Raymond GarbeISOVEC,RICHARD W, MD Primary Cardiologist: Raymond GessJonathan J. Raisa Ditto MD Raymond Gordon,Raymond Gordon,Raymond Gordon, Raymond Gordon   HPI:  The patient is a very pleasant, 64 year old severely overweight, single [place] male with no children who I last saw in the office 12 months ago. 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 His risk factors including noninsulin-requiring diabetes, hypertension, hyperlipidemia and remote tobacco abuse having quit smoking back in 1999 and had smoked 1 pack per day. His father did have an MI and had bypass surgery. [facility], [facility], with chest pain and A-fib. He was catheterized by Dr. [place] Gordon on July 18, 2011, revealing minimal luminal irregularities with small vessel disease and normal LV function. He ultimately spontaneously converted to sinus rhythm and was placed on Pradaxa because of a CHADS2 score of 2. He has had no recurrent symptoms. He denies chest pain or shortness of breath.Dr. Wylene Gordon follows his lipid profile. He does give symptoms compatible with obstructive sleep apnea.   Current Outpatient Prescriptions  Medication Sig Dispense Refill  . allopurinol (ZYLOPRIM) 100 MG tablet Take 100 mg by mouth daily.     Marland Kitchen. amLODipine (NORVASC) 10 MG tablet Take 10 mg by mouth daily.     Marland Kitchen. aspirin 81 MG tablet Take 81 mg by mouth daily.    Marland Kitchen. atorvastatin (LIPITOR) 40 MG tablet Take 40 mg by mouth daily at 6 PM.     . carvedilol (COREG) 25 MG tablet Take 1 tablet (25 mg total) by mouth 2 (two) times daily with a meal. 60 tablet 3  . dabigatran (PRADAXA) 150 MG CAPS Take 1 capsule (150 mg total) by mouth every 12 (twelve) hours. 60 capsule 6  . Dapagliflozin Propanediol (FARXIGA) 10 MG TABS Take 10 mg by mouth daily. 30 tablet 12  . DIOVAN 160 MG tablet Take 160 mg by mouth 2 (two) times daily.     Marland Kitchen. doxazosin (CARDURA) 4 MG tablet Take 4 mg by  mouth daily.     . hydrochlorothiazide (HYDRODIURIL) 25 MG tablet Take 25 mg by mouth daily.    . insulin NPH-regular Human (NOVOLIN 70/30) (70-30) 100 UNIT/ML injection 60 units in the morning, 50 units at lunch, and 50 units at night 10 mL 11  . metoprolol tartrate (LOPRESSOR) 25 MG tablet Take 25 mg by mouth daily.    . Multiple Vitamin (MULTIVITAMIN) capsule Take 1 capsule by mouth daily.    Marland Kitchen. omeprazole (PRILOSEC OTC) 20 MG tablet Take 1 tablet (20 mg total) by mouth daily.     No current facility-administered medications for this visit.    No Known Allergies  History   Social History  . Marital Status: Single    Spouse Name: N/A    Number of Children: N/A  . Years of Education: N/A   Occupational History  . Not on file.   Social History Main Topics  . Smoking status: Former Smoker    Quit date: 11/25/1997  . Smokeless tobacco: Not on file  . Alcohol Use: Not on file  . Drug Use: Not on file  . Sexual Activity: Not on file   Other Topics Concern  . Not on file   Social History Narrative     Review of Systems: General: negative for chills, fever, night sweats or weight changes.  Cardiovascular: negative for chest pain, dyspnea on exertion,  edema, orthopnea, palpitations, paroxysmal nocturnal dyspnea or shortness of breath Dermatological: negative for rash Respiratory: negative for cough or wheezing Urologic: negative for hematuria Abdominal: negative for nausea, vomiting, diarrhea, bright red blood per rectum, melena, or hematemesis Neurologic: negative for visual changes, syncope, or dizziness All other systems reviewed and are otherwise negative except as noted above.    Blood pressure 124/58, pulse 76, height  (1.626 m), weight 266 lb (120.657 kg).  General appearance: alert and no distress Neck: no adenopathy, no carotid bruit, no JVD, supple, symmetrical, trachea midline and thyroid not enlarged, symmetric, no tenderness/mass/nodules Lungs: clear to  auscultation bilaterally Heart: regular rate and rhythm, S1, S2 normal, no murmur, click, rub or gallop Extremities: 1+ bilateral lower extremity edema  EKG normal sinus rhythm at 76 without ST or T-wave changes. I personally reviewed this EKG  ASSESSMENT AND PLAN:   Paroxysmal atrial fibrillation Maintaining sinus rhythm on beta blocker and Pradaxa   Hyperlipidemia History of hyperlipidemia on atorvastatin 40 mg a day. This is followed by his PCP.   Essential hypertension History of hypertension and blood pressure measured at 124/58. He is on amlodipine, carvedilol, Diovan, hydrochlorothiazide and metoprolol. We will review his current med rec and determine why he is on 2 beta blockers. Otherwise, continue current meds at current dosing       Raymond Gess MD [facility], [facility] 12/09/2014 10:29 AM

## 2014-12-09 NOTE — Patient Instructions (Signed)
  We will see you back in follow up in 1 year with Dr Allyson SabalBerry.   Dr Allyson SabalBerry has ordered: 1. sleep study. This test records several body functions during sleep, including: brain activity, eye movement, oxygen and carbon dioxide blood levels, heart rate and rhythm, breathing rate and rhythm, the flow of air through your mouth and nose, snoring, body muscle movements, and chest and belly movement.

## 2015-01-05 ENCOUNTER — Ambulatory Visit: Payer: Self-pay | Admitting: Podiatrist

## 2015-01-06 ENCOUNTER — Ambulatory Visit: Payer: 59 | Admitting: Podiatrist

## 2015-01-18 ENCOUNTER — Other Ambulatory Visit: Payer: Self-pay | Admitting: Cardiovascular Disease

## 2015-01-19 ENCOUNTER — Encounter: Payer: Self-pay | Admitting: Podiatrist

## 2015-01-19 ENCOUNTER — Ambulatory Visit (INDEPENDENT_AMBULATORY_CARE_PROVIDER_SITE_OTHER): Payer: 59 | Admitting: Podiatrist

## 2015-01-19 DIAGNOSIS — B351 Tinea unguium: Secondary | ICD-10-CM

## 2015-01-19 DIAGNOSIS — M79676 Pain in unspecified toe(s): Secondary | ICD-10-CM

## 2015-01-19 NOTE — Telephone Encounter (Signed)
Rx has been sent to the pharmacy electronically. ° °

## 2015-01-19 NOTE — Progress Notes (Signed)
Subjective:  Patient presents today for follow up of foot and nail care. Denies any new concerns.  Relates pain with walking and shoes due to the toenails.   Physical Exam   Pedal pulses faintly palpable at 1/4 dp and pt. He has lymphedema and wears compressive support hose. Normal proximal to distal temperature gradient. Neurological sensation intact protectively and epicritically. All 10 toenails are long, thick, discolored, distrophic and mycotic x 10.  Pain with pressure on nail plates noted.  Assessment:  Symptomatic onychomycosis   Plan: debridement of toenails carried out without complication.  He will return in 3 months or as needed for follow up.

## 2015-01-23 ENCOUNTER — Ambulatory Visit (HOSPITAL_BASED_OUTPATIENT_CLINIC_OR_DEPARTMENT_OTHER): Payer: 59 | Attending: Cardiovascular Disease

## 2015-01-23 VITALS — Ht 64.0 in | Wt 267.0 lb

## 2015-01-23 DIAGNOSIS — G4733 Obstructive sleep apnea (adult) (pediatric): Secondary | ICD-10-CM

## 2015-01-23 DIAGNOSIS — G4719 Other hypersomnia: Secondary | ICD-10-CM | POA: Diagnosis present

## 2015-01-23 DIAGNOSIS — R0683 Snoring: Secondary | ICD-10-CM | POA: Diagnosis not present

## 2015-01-28 NOTE — Addendum Note (Signed)
Addended by: Nicki GuadalajaraKELLY, Deniesha Stenglein A on: 01/28/2015 04:25 PM   Modules accepted: Level of Service

## 2015-01-28 NOTE — Sleep Study (Signed)
NAME: Raymond Gordon DATE OF BIRTH:  27-Jul-1951 MEDICAL RECORD NUMBER [id number]  LOCATION: [facility]  PHYSICIAN: Kele Withem A  DATE OF STUDY: 01/23/2015  SLEEP STUDY TYPE: Nocturnal Polysomnogram               REFERRING PHYSICIAN: Runell Gess, MD  INDICATION FOR STUDY:  Raymond Gordon is a 64 year old African-American male who has a history of paroxysmal atrial fibrillation, hyperlipidemia and essential hypertension.  He has had poor sleep, excessive daytime sleepiness, and is referred for evaluation of sleep apnea.  EPWORTH SLEEPINESS SCORE:  14 which is compatible with excessive daytime sleepiness HEIGHT:  (162.6 cm)  WEIGHT: 267 lb (121.11 kg)    Body mass index is 45.81 kg/(m^2).  NECK SIZE: 18.5 in.  MEDICATIONS:   omeprazole (PRILOSEC OTC) 20 MG tablet 20 mg, Daily Multiple Vitamin (MULTIVITAMIN) capsule 1 capsule, Daily insulin NPH-regular Human (NOVOLIN 70/30) (70-30) 100 UNIT/ML injection hydrochlorothiazide (HYDRODIURIL) 25 MG tablet 25 mg, Daily doxazosin (CARDURA) 4 MG tablet 4 mg, Daily  Note: Received from: External Pharmacy (Written 11/30/2013 0940)  DIOVAN 160 MG tablet 160 mg, 2 times daily  Note: Received from: External Pharmacy (Written 11/30/2013 0940)  Dapagliflozin Propanediol (FARXIGA) 10 MG TABS 10 mg, Daily dabigatran (PRADAXA) 150 MG CAPS 150 mg, Every 12 hours carvedilol (COREG) 25 MG tablet carvedilol (COREG) 25 MG tablet 25 mg, 2 times daily with meals atorvastatin (LIPITOR) 40 MG tablet 40 mg, Daily-1800  Note: Received from: External Pharmacy (Written 11/30/2013 0940)  aspirin 81 MG tablet 81 mg, Daily amLODipine (NORVASC) 10 MG tablet 10 mg, Daily  Note: Received from: External Pharmacy (Written 11/30/2013 0940)  allopurinol (ZYLOPRIM) 100 MG tablet 100 mg, Daily  Note: Received from: External Pharmacy (Written 11/30/2013 0940)     SLEEP ARCHITECTURE:  The patient slept for 266 minutes out of a sleep period of time of 337  minutes.  Sleep efficiency was reduced at 73.1%.  Onset to sleep was normal at 26.5 minutes.  There was absence of REM sleep.  The patient slept 68 minutes (25.6%) in stage I, 198 minutes (74.4%).  In stage II.  He slept for 227.5 minutes.  Supine (85.5%).  There were total of 315 arousals with a significantly elevated index of 71.1.     RESPIRATORY DATA:  During the sleep period, there were 294 obstructive apneas, 6 central apneas, and 10 mixed apneas.  There were 86 hypopneas.  The apnea-hypopnea index (AHI) was 89.5/hr.  The respiratory disturbance index (RDI) was 92/hr. .  This places the patient in the category of severe obstructive sleep apnea. There was mild to moderate snoring   OXYGEN DATA:  The baseline oxygen saturation was 99%.  The lowest oxygen saturation during non-REM sleep was 72%.  CARDIAC DATA:  The average heart rate was 77 bpm.  There were rare to occasional PACs and rare PVCs.   MOVEMENT/PARASOMNIA:  There were 0 periodic limb movements.   IMPRESSION/ RECOMMENDATION:   Severe obstructive sleep apnea/hypopnea syndrome. Respiratory events with frequent oxygen desaturation to a nadir of 72% during non-REM sleep. Abnormal sleep architecture with absence of slow wave sleep and REM sleep. While the moderate snoring. No evidence for nocturnal myoclonus. The arousal index was severely abnormal.  Recommend expeditious CPAP titration in light of the severity of the patient's sleep apnea/hypopnea syndrome and its symptomatic nature.  Efforts should be made to optimize nasal and oral pharyngeal patency.  The patient should be counseled on good sleep  hygiene and weight loss.   Raymond Gordon,Brinlynn Gorton A Diplomate, American Board of Sleep Medicine  ELECTRONICALLY SIGNED ON:  01/28/2015, 4:10 PM [facility] PH: [phone]   FX: [phone] ACCREDITED BY THE AMERICAN ACADEMY OF SLEEP MEDICINE

## 2015-02-03 ENCOUNTER — Telehealth: Payer: Self-pay | Admitting: *Deleted

## 2015-02-03 DIAGNOSIS — G4733 Obstructive sleep apnea (adult) (pediatric): Secondary | ICD-10-CM

## 2015-02-03 NOTE — Telephone Encounter (Signed)
Called and notified patient of sleep study results and recommendations. Patient voiced understanding of these results and will [facility] set up appointment.

## 2015-02-06 ENCOUNTER — Encounter: Payer: Self-pay | Admitting: Cardiovascular Disease

## 2015-02-15 ENCOUNTER — Ambulatory Visit (HOSPITAL_BASED_OUTPATIENT_CLINIC_OR_DEPARTMENT_OTHER): Payer: 59 | Attending: Cardiovascular Disease | Admitting: Radiology

## 2015-02-15 VITALS — Ht 64.0 in | Wt 267.0 lb

## 2015-02-15 DIAGNOSIS — G4733 Obstructive sleep apnea (adult) (pediatric): Secondary | ICD-10-CM | POA: Diagnosis not present

## 2015-03-05 NOTE — Sleep Study (Signed)
NAME: Raymond LandryBobby O Maclachlan DATE OF BIRTH:  05/09/1951 MEDICAL RECORD NUMBER [id number]  LOCATION: [facility]  PHYSICIAN: Trayvion Embleton A  DATE OF STUDY: 02/15/2015  SLEEP STUDY TYPE: Positive Airway Pressure Titration               REFERRING PHYSICIAN: Lennette BihariKelly, Lenaya Pietsch A, MD  INDICATION FOR STUDY: Severe obstructive sleep apnea with an AHI of 89.5 per hour and RDI of 92 per hour.  EPWORTH SLEEPINESS SCORE:  14 HEIGHT: 5\' 4"  (162.6 cm)  WEIGHT: 267 lb (121.11 kg)    Body mass index is 45.81 kg/(m^2).  NECK SIZE: 18.5 in.  MEDICATIONS:  omeprazole (PRILOSEC OTC) 20 MG tablet 20 mg, Daily Multiple Vitamin (MULTIVITAMIN) capsule 1 capsule, Daily insulin NPH-regular Human (NOVOLIN 70/30) (70-30) 100 UNIT/ML injection hydrochlorothiazide (HYDRODIURIL) 25 MG tablet 25 mg, Daily doxazosin (CARDURA) 4 MG tablet 4 mg, Daily  Note: Received from: External Pharmacy (Written 11/30/2013 0940)  DIOVAN 160 MG tablet 160 mg, 2 times daily  Note: Received from: External Pharmacy (Written 11/30/2013 0940)  Dapagliflozin Propanediol (FARXIGA) 10 MG TABS 10 mg, Daily dabigatran (PRADAXA) 150 MG CAPS 150 mg, Every 12 hours carvedilol (COREG) 25 MG tablet carvedilol (COREG) 25 MG tablet 25 mg, 2 times daily with meals atorvastatin (LIPITOR) 40 MG tablet 40 mg, Daily-1800  Note: Received from: External Pharmacy (Written 11/30/2013 0940)  aspirin 81 MG tablet 81 mg, Daily amLODipine (NORVASC) 10 MG tablet 10 mg, Daily  Note: Received from: External Pharmacy (Written 11/30/2013 0940)  allopurinol (ZYLOPRIM) 100 MG tablet 100 mg, Daily   SLEEP ARCHITECTURE: Total recording time 395 minutes.  Total sleep time 326 minutes of sleep period of time of 375 minutes.  Percent sleep efficiency 82.5%.  Latency to sleep onset 20 minutes.  Latency to onset of first from sleep 32.5 minutes.  The patient spent 43.5 minutes in stage I (13.3%), 192 minutes in stage II (58.9%), 0 minutes in stage III, and 90.5 minutes in  rem sleep (27.8%.  Supine in rem sleep was achieved and represented 24.1% of the sleep time.  There were total of 90 arousals with an index of 16.6.  RESPIRATORY DATA:  CPAP was initiated at 5 cm water pressure and was titrated up to 20 cm.  Due to continued events at high pressure, BiPAP was initiated at 22/18 and was titrated up to 25/21.  Central events commenced at a CPAP pressure of 18.  Central events were present with BiPAP despite high pressure titrating to 25/21.  At 17 cm pressure, the AHI was 3.9/hr and lowest oxygen saturation was 91%.  At 18 cm CPAP pressure there were 2 central events and the AHI was 6.6 per hour.  With titrated high BiPAP pressures the AHI remained elevated due to central events.  OXYGEN DATA:  The baseline oxygen saturation was 94%.  The lowest oxygen saturation was 70% at 13 cm water pressure in rem sleep and 82% at 9 cm water pressure with non-REM sleep.  CARDIAC DATA:  The patient was in sinus rhythm.  There were occasional PAC and PVC.  MOVEMENT/PARASOMNIA: There were 0 periodic limb movements.  IMPRESSION/ RECOMMENDATION:   This patient has previously documented severe obstructive sleep apnea on the diagnostic polysomnogram with an AHI of 89.5 per hour associated with mild-to-moderate snoring.  Recommend initiation of CPAP therapy, initially at 17 cm water pressure with an EPR of 3 and heated humidifier.  Monitoring for central apnea events will be necessary.  Central events were  present at higher BiPAP pressures.  Recommend download in 30 days and sleep clinic evaluation.  Lennette Bihari Diplomate, American Board of Sleep Medicine  ELECTRONICALLY SIGNED ON:  03/05/2015, 3:20 PM [facility] PH: [phone]   FX: [phone] ACCREDITED BY THE AMERICAN ACADEMY OF SLEEP MEDICINE

## 2015-03-05 NOTE — Addendum Note (Signed)
Addended by: Nicki GuadalajaraKELLY, Jhair Witherington A on: 03/05/2015 03:40 PM   Modules accepted: Level of Service

## 2015-03-10 ENCOUNTER — Telehealth: Payer: Self-pay | Admitting: *Deleted

## 2015-03-10 NOTE — Telephone Encounter (Signed)
Faxed CPAP referral with orders and records to [facility] .

## 2015-03-13 ENCOUNTER — Other Ambulatory Visit (HOSPITAL_COMMUNITY): Payer: Self-pay | Admitting: Internal Medicine

## 2015-03-13 ENCOUNTER — Ambulatory Visit (HOSPITAL_COMMUNITY)
Admission: RE | Admit: 2015-03-13 | Discharge: 2015-03-13 | Disposition: A | Discharge status: Home / Self Care | Payer: 59 | Source: Ambulatory Visit | Attending: Surgery | Admitting: Surgery

## 2015-03-13 DIAGNOSIS — M7989 Other specified soft tissue disorders: Secondary | ICD-10-CM

## 2015-03-13 DIAGNOSIS — R609 Edema, unspecified: Secondary | ICD-10-CM | POA: Insufficient documentation

## 2015-03-28 ENCOUNTER — Telehealth: Payer: Self-pay | Admitting: Cardiovascular Disease

## 2015-03-28 NOTE — Telephone Encounter (Signed)
Pt. Stated he had a sleep study a month ago and still doesn't have the results

## 2015-03-28 NOTE — Telephone Encounter (Signed)
Pt said he had a sleep study test about a month ago.He still does not have the results.

## 2015-03-28 NOTE — Telephone Encounter (Signed)
Raymond Gordon, can you please check on this status. It looks like you sent in the referral for DME.

## 2015-03-30 NOTE — Telephone Encounter (Signed)
Spoke with Raymond DikeJennifer @ [facility] to inquire about the status of patient's setup. She informed me that they have made several unsuccessful  attempts to contact the patient. I then called the patient and gave him their number and the contact names of who he needs to speak with. Patient will call [facility] to arrange set up.

## 2015-04-10 ENCOUNTER — Telehealth: Payer: Self-pay | Admitting: *Deleted

## 2015-04-10 NOTE — Telephone Encounter (Signed)
Faxed signed order for CPAP and supplies to [facility].

## 2015-04-20 ENCOUNTER — Ambulatory Visit: Payer: 59 | Admitting: Podiatrist

## 2015-04-27 ENCOUNTER — Ambulatory Visit: Payer: 59 | Admitting: Podiatry

## 2015-05-04 ENCOUNTER — Encounter: Payer: Self-pay | Admitting: Podiatry

## 2015-05-04 ENCOUNTER — Ambulatory Visit (INDEPENDENT_AMBULATORY_CARE_PROVIDER_SITE_OTHER): Payer: 59 | Admitting: Podiatry

## 2015-05-04 DIAGNOSIS — E1159 Type 2 diabetes mellitus with other circulatory complications: Secondary | ICD-10-CM | POA: Diagnosis not present

## 2015-05-04 DIAGNOSIS — B351 Tinea unguium: Secondary | ICD-10-CM | POA: Diagnosis not present

## 2015-05-04 DIAGNOSIS — M79676 Pain in unspecified toe(s): Secondary | ICD-10-CM | POA: Diagnosis not present

## 2015-05-04 DIAGNOSIS — E1151 Type 2 diabetes mellitus with diabetic peripheral angiopathy without gangrene: Secondary | ICD-10-CM

## 2015-05-04 NOTE — Progress Notes (Signed)
Patient ID: Raymond Gordon, male   DOB: 26-Feb-1951, 64 y.o.   MRN: [id number] Complaint:  Visit Type: Patient returns to my office for continued preventative foot care services. Complaint: Patient states" my nails have grown long and thick and become painful to walk and wear shoes" Patient has been diagnosed with DM with no complications. He presents for preventative foot care services. No changes to ROS  Podiatric Exam: Vascular: dorsalis pedis and posterior tibial pulses are absent  bilateral. Capillary return is diminished Temperature gradient is WNL. Skin turgor WNL  Sensorium: Normal Semmes Weinstein monofilament test. Normal tactile sensation bilaterally. Nail Exam: Pt has thick disfigured discolored nails with subungual debris noted bilateral entire nail hallux through fifth toenails Ulcer Exam: There is no evidence of ulcer or pre-ulcerative changes or infection. Orthopedic Exam: Muscle tone and strength are WNL. No limitations in general ROM. No crepitus or effusions noted. Foot type and digits show no abnormalities. Bony prominences are unremarkable. Skin: No Porokeratosis. No infection or ulcers  Diagnosis:  Tinea unguium, Pain in right toe, pain in left toes  Treatment & Plan Procedures and Treatment: Consent by patient was obtained for treatment procedures. The patient understood the discussion of treatment and procedures well. All questions were answered thoroughly reviewed. Debridement of mycotic and hypertrophic toenails, 1 through 5 bilateral and clearing of subungual debris. No ulceration, no infection noted.  Return Visit-Office Procedure: Patient instructed to return to the office for a follow up visit 3 months for continued evaluation and treatment.

## 2015-06-09 DIAGNOSIS — I517 Cardiomegaly: Secondary | ICD-10-CM | POA: Diagnosis not present

## 2015-06-09 DIAGNOSIS — R609 Edema, unspecified: Secondary | ICD-10-CM | POA: Diagnosis not present

## 2015-06-09 DIAGNOSIS — Z6841 Body Mass Index (BMI) 40.0 and over, adult: Secondary | ICD-10-CM | POA: Diagnosis not present

## 2015-06-09 DIAGNOSIS — R0609 Other forms of dyspnea: Secondary | ICD-10-CM | POA: Diagnosis not present

## 2015-06-09 DIAGNOSIS — E785 Hyperlipidemia, unspecified: Secondary | ICD-10-CM | POA: Diagnosis not present

## 2015-06-09 DIAGNOSIS — E1129 Type 2 diabetes mellitus with other diabetic kidney complication: Secondary | ICD-10-CM | POA: Diagnosis not present

## 2015-06-09 DIAGNOSIS — I480 Paroxysmal atrial fibrillation: Secondary | ICD-10-CM | POA: Diagnosis not present

## 2015-06-09 DIAGNOSIS — Z1389 Encounter for screening for other disorder: Secondary | ICD-10-CM | POA: Diagnosis not present

## 2015-06-09 DIAGNOSIS — R809 Proteinuria, unspecified: Secondary | ICD-10-CM | POA: Diagnosis not present

## 2015-06-09 DIAGNOSIS — I10 Essential (primary) hypertension: Secondary | ICD-10-CM | POA: Diagnosis not present

## 2015-06-09 DIAGNOSIS — G4733 Obstructive sleep apnea (adult) (pediatric): Secondary | ICD-10-CM | POA: Diagnosis not present

## 2015-06-15 ENCOUNTER — Ambulatory Visit (INDEPENDENT_AMBULATORY_CARE_PROVIDER_SITE_OTHER): Payer: Medicare Other | Admitting: Cardiovascular Disease

## 2015-06-15 ENCOUNTER — Encounter: Payer: Self-pay | Admitting: Cardiovascular Disease

## 2015-06-15 VITALS — BP 190/68 | HR 75 | Ht 65.0 in | Wt 277.8 lb

## 2015-06-15 DIAGNOSIS — I480 Paroxysmal atrial fibrillation: Secondary | ICD-10-CM

## 2015-06-15 DIAGNOSIS — G4733 Obstructive sleep apnea (adult) (pediatric): Secondary | ICD-10-CM | POA: Diagnosis not present

## 2015-06-15 DIAGNOSIS — I10 Essential (primary) hypertension: Secondary | ICD-10-CM | POA: Diagnosis not present

## 2015-06-15 DIAGNOSIS — R600 Localized edema: Secondary | ICD-10-CM

## 2015-06-15 MED ORDER — FUROSEMIDE 40 MG PO TABS: 40.0000 mg | ORAL_TABLET | Freq: Every day | ORAL | Status: DC

## 2015-06-15 NOTE — Patient Instructions (Signed)
Your physician recommends that you schedule a follow-up appointment in: 6-8 Weeks   Your physician recommends that you schedule a follow-up appointment in: September for Sleep clinic with Dr Tresa Endo  Your physician has recommended you make the following change in your medication: Decrease Amlodipine to 5 mg daily, STOP HCTz and START Furosemide 40 mg daily

## 2015-06-15 NOTE — Progress Notes (Signed)
Patient ID: Raymond Gordon, male   DOB: 1951/06/29, 64 y.o.   MRN: [id number]     HPI: Raymond Gordon is a 65 y.o. male who presents to the office today for a sleep clinic evaluation following initiation of CPAP therapy.  He is a patient of  Dr. Gwenlyn Found and Dr. Osborne Casco.  Raymond Gordon is a 64 year old African-American male who has a history of obesity, diabetes mellitus, hypertension, hyperlipidemia, and strong family history for coronary artery disease.  He has a history of chest pain and atrial fibrillation and cardiac catheterization in 2012 showed mild luminal irregularities with small vessel disease and normal LV function.  He has had issues with significant peripheral edema and hypertension.  Due to concerns for sleep apnea, he was referred for a diagnostic polysomnogram which was done at [facility] and interpreted by me on 11/23/2015.  This demonstrated severe sleep apnea with an HI of 89.5.  He was unable to achieve any rim sleep.  He had significant oxygen desaturation to 72% with non-REM sleep.  There was moderate snoring.  He subsequently underwent a CPAP titration trial and he was titrated up to a 17 cm water pressure.  Central events commenced at a CPAP pressure of 18.  He has an area sent 10 AutoSet unit.  A download obtained from 05/14/2015 through 06/12/2015 reveals 100%.  Days of usage for which he is compliant.  However, he is not compliant with reference to percent.  Days greater than 4 hours and he apparently is only averaging one hour and 38 minutes per night.  Upon questioning he goes to bed at 11:30 PM and wakes up at 6:30.  His download suggests that he is having significant amount of leak.  He states that he oftentimes is going to the bathroom after several hours of sleep.  He takes his mask off and then puts it back on and he believes that CPAP is back on working.  He does have daytime sleepiness.  Epworth sleepiness scale score was 14 prior to initiation of therapy.    Past Medical  History  Diagnosis Date  . Paroxysmal atrial fibrillation   . Hypertension   . Hyperlipidemia   . Obesity   . Diabetes   . Dysrhythmia   . Obesity   . Bilateral lower extremity edema     Past Surgical History  Procedure Laterality Date  . Cardiac catheterization    . Cardiac catheterization N/A 2012    no large vessel occlusions    No Known Allergies  Current Outpatient Prescriptions  Medication Sig Dispense Refill  . allopurinol (ZYLOPRIM) 300 MG tablet     . amLODipine (NORVASC) 10 MG tablet Take 5 mg by mouth daily.     Marland Kitchen aspirin 81 MG tablet Take 81 mg by mouth daily.    Marland Kitchen atorvastatin (LIPITOR) 40 MG tablet Take 40 mg by mouth daily at 6 PM.     . carvedilol (COREG) 25 MG tablet Take 1 tablet (25 mg total) by mouth 2 (two) times daily with a meal. 60 tablet 3  . carvedilol (COREG) 25 MG tablet TAKE ONE & ONE-HALF TABLETS BY MOUTH TWICE DAILY WITH MEALS 90 tablet 6  . colchicine 0.6 MG tablet     . dabigatran (PRADAXA) 150 MG CAPS Take 1 capsule (150 mg total) by mouth every 12 (twelve) hours. 60 capsule 6  . Dapagliflozin Propanediol (FARXIGA) 10 MG TABS Take 10 mg by mouth daily. 30 tablet 12  . DIOVAN 160  MG tablet Take 160 mg by mouth 2 (two) times daily.     Marland Kitchen doxazosin (CARDURA) 4 MG tablet Take 4 mg by mouth daily.     Marland Kitchen HUMALOG MIX 75/25 KWIKPEN (75-25) 100 UNIT/ML Kwikpen     . insulin NPH-regular Human (NOVOLIN 70/30) (70-30) 100 UNIT/ML injection 60 units in the morning, 50 units at lunch, and 50 units at night 10 mL 11  . Multiple Vitamin (MULTIVITAMIN) capsule Take 1 capsule by mouth daily.    Marland Kitchen omeprazole (PRILOSEC OTC) 20 MG tablet Take 1 tablet (20 mg total) by mouth daily.    . ONE TOUCH ULTRA TEST test strip     . furosemide (LASIX) 40 MG tablet Take 1 tablet (40 mg total) by mouth daily. 30 tablet 6   No current facility-administered medications for this visit.    History   Social History  . Marital Status: Single    Spouse Name: N/A  . Number  of Children: N/A  . Years of Education: N/A   Occupational History  . Not on file.   Social History Main Topics  . Smoking status: Former Smoker    Quit date: 11/25/1997  . Smokeless tobacco: Not on file  . Alcohol Use: No  . Drug Use: No  . Sexual Activity: Not on file   Other Topics Concern  . Not on file   Social History Narrative    Family History  Problem Relation Age of Onset  . Heart failure Brother   . Hyperlipidemia Brother   . Hypertension Brother   . Stroke Brother   . Hypertension Mother   . Hyperlipidemia Mother   . Diabetes Mother   . Cancer Father     prostate  . GER disease Father   . Stroke Father   . Hypertension Sister   . Hyperlipidemia Sister   . Hypertension Maternal Grandmother   . Stroke Maternal Grandmother   . Diabetes Maternal Grandfather   . Hypertension Maternal Grandfather     ROS General: Negative; No fevers, chills, or night sweats HEENT: Negative; No changes in vision or hearing, sinus congestion, difficulty swallowing Pulmonary: Negative; No cough, wheezing, shortness of breath, hemoptysis Cardiovascular: See HPI: Positive for leg swelling.  No chest pain. GI: Negative; No nausea, vomiting, diarrhea, or abdominal pain GU: Negative; No dysuria, hematuria, or difficulty voiding Musculoskeletal: Negative; no myalgias, joint pain, or weakness Hematologic: Negative; no easy bruising, bleeding Endocrine: Negative; no heat/cold intolerance; no diabetes, Neuro: Negative; no changes in balance, headaches Skin: Negative; No rashes or skin lesions Psychiatric: Negative; No behavioral problems, depression Sleep: Severe obstructive sleep apnea. Other comprehensive 14 point system review is negative   Physical Exam BP 190/68 mmHg  Pulse 75  Ht 5' 5"  (1.651 m)  Wt 277 lb 12.8 oz (126.009 kg)  BMI 46.23 kg/m2 Wt Readings from Last 3 Encounters:  06/15/15 277 lb 12.8 oz (126.009 kg)  02/15/15 267 lb (121.11 kg)  01/23/15 267 lb  (121.11 kg)   General: Alert, oriented, no distress.  Skin: normal turgor, no rashes, warm and dry HEENT: Normocephalic, atraumatic. Pupils equal round and reactive to light; sclera anicteric; extraocular muscles intact, No lid lag; Nose without nasal septal hypertrophy; Mouth/Parynx benign; Mallinpatti scale 3/4 Neck: No JVD, no carotid bruits; normal carotid upstroke Lungs: clear to ausculatation and percussion bilaterally; no wheezing or rales, normal inspiratory and expiratory effort Chest wall: without tenderness to palpitation Heart: PMI not displaced, RRR, s1 s2 normal, 1/6 systolic murmur, No diastolic  murmur, no rubs, gallops, thrills, or heaves Abdomen: soft, nontender; no hepatosplenomehaly, BS+; abdominal aorta nontender and not dilated by palpation. Back: no CVA tenderness Pulses: 2+  Musculoskeletal: full range of motion, normal strength, no joint deformities Extremities: Tense 3+ lower extremity edema to just below the knees., Homan's sign negative  Neurologic: grossly nonfocal; Cranial nerves grossly wnl Psychologic: Normal mood and affect   ECG (independently read by me): Not done today but previous ECG reviewed  LABS:  BMP Latest Ref Rng 05/21/2014 05/20/2014 08/08/2011  Glucose 70 - 99 mg/dL 131(H) 62(L) 82  BUN 6 - 23 mg/dL 11 12 7   Creatinine 0.50 - 1.35 mg/dL 0.84 0.88 0.80  Sodium 137 - 147 mEq/L 139 140 143  Potassium 3.7 - 5.3 mEq/L 3.6(L) 3.9 3.4(L)  Chloride 96 - 112 mEq/L 103 101 103  CO2 19 - 32 mEq/L 21 22 -  Calcium 8.4 - 10.5 mg/dL 9.2 10.0 -     Hepatic Function Latest Ref Rng 05/21/2014 05/20/2014 07/17/2011  Total Protein 6.0 - 8.3 g/dL 7.3 7.8 7.9  Albumin 3.5 - 5.2 g/dL 3.1(L) 3.6 3.5  AST 0 - 37 U/L 24 30 16   ALT 0 - 53 U/L 21 24 20   Alk Phosphatase 39 - 117 U/L 89 99 83  Total Bilirubin 0.3 - 1.2 mg/dL 0.3 0.3 0.2(L)    CBC Latest Ref Rng 05/21/2014 05/20/2014 08/08/2011  WBC 4.0 - 10.5 K/uL 6.6 7.1 -  Hemoglobin 13.0 - 17.0 g/dL 10.8(L)  11.4(L) 14.3  Hematocrit 39.0 - 52.0 % 34.6(L) 37.1(L) 42.0  Platelets 150 - 400 K/uL 219 233 -   Lab Results  Component Value Date   MCV 81.6 05/21/2014   MCV 82.1 05/20/2014   MCV 81.3 07/18/2011    Lab Results  Component Value Date   TSH 1.920 07/17/2011    BNP No results found for: BNP  ProBNP    Component Value Date/Time   PROBNP 302.3* 05/20/2014 1309     Lipid Panel     Component Value Date/Time   CHOL 229* 07/18/2011 0345   TRIG 101 07/18/2011 0345   HDL 46 07/18/2011 0345   CHOLHDL 5.0 07/18/2011 0345   VLDL 20 07/18/2011 0345   LDLCALC 163* 07/18/2011 0345     RADIOLOGY: No results found.    ASSESSMENT AND PLAN: Raymond Gordon is a 64 year old Afro-American gentleman with morbid obesity and a body mass index of 46.23.  He has a history of significant hypertension, diabetes mellitus, hyperlipidemia, family history for CAD, as well as a remote history of paroxysmal atrial fibrillation.  He has been maintaining sinus rhythm.  I had a long discussion with him today.  I reviewed both his diagnostic polysomnogram and his CPAP titration trial in detail.  He has severe obstructive sleep apnea with an HI of approximately 90/h.  He was unable to achieve any rim sleep on his diagnostic polysomnogram.  His download suggests that he is having significant mask leak.  We attempted to have him put his mask on.  The mask in the office today, did not appear to have a significant leak.  I am questioning if some of this reduced compliance may be that after he takes the mask off If he turns the machine off it is not coming back on.  I will long discussion with him regarding the consequences of untreated sleep apnea with reference to cardiovascular comorbidities.  I discussed the severity of his sleep apnea and a significant nocturnal oxygen desaturation  on his sleep study and the potential for recurrent atrial fibrillation being increased.  If left untreated.  He appears to have a  improved understanding on the natural history and one needs to be done to meet compliance standards.  We will also change his unit to an auto mode.  In the event the high pressures may be playing a role in his high leak and reduced greater than 4 hour compliance state.  I repeated his blood pressure today.  This was improved from on presentation at 140/70.  He has tense 3+ lower extremity edema.  This may be contributed by his amlodipine and I recommended that he reduce his present dose from 10 mg to 5 mg.  I have recommended he discontinue HCTZ and have started him on furosemide 40 mg in attempt to improve diuresis.  I have recommended follow-up with Dr. Gwenlyn Found over the next 4 weeks and further adjustments may be necessary.  I will see him in sleep clinic in September for follow-up evaluation and hopefully at that time he will meet compliance standards.   Time spent: 30 minutes  Troy Sine, MD, [facility]  06/15/2015 7:54 PM

## 2015-06-19 ENCOUNTER — Encounter: Payer: Self-pay | Admitting: Cardiovascular Disease

## 2015-06-25 ENCOUNTER — Emergency Department (HOSPITAL_COMMUNITY): Payer: Medicare Other

## 2015-06-25 ENCOUNTER — Emergency Department (HOSPITAL_COMMUNITY)
Admission: EM | Admit: 2015-06-25 | Discharge: 2015-06-26 | Disposition: A | Discharge status: Home / Self Care | Payer: Medicare Other | Attending: Emergency Medicine | Admitting: Emergency Medicine

## 2015-06-25 ENCOUNTER — Encounter (HOSPITAL_COMMUNITY): Payer: Self-pay | Admitting: Emergency Medicine

## 2015-06-25 DIAGNOSIS — Z7982 Long term (current) use of aspirin: Secondary | ICD-10-CM | POA: Insufficient documentation

## 2015-06-25 DIAGNOSIS — E785 Hyperlipidemia, unspecified: Secondary | ICD-10-CM | POA: Diagnosis not present

## 2015-06-25 DIAGNOSIS — R312 Other microscopic hematuria: Secondary | ICD-10-CM | POA: Diagnosis not present

## 2015-06-25 DIAGNOSIS — E119 Type 2 diabetes mellitus without complications: Secondary | ICD-10-CM | POA: Diagnosis not present

## 2015-06-25 DIAGNOSIS — Z9889 Other specified postprocedural states: Secondary | ICD-10-CM | POA: Insufficient documentation

## 2015-06-25 DIAGNOSIS — N3289 Other specified disorders of bladder: Secondary | ICD-10-CM | POA: Diagnosis not present

## 2015-06-25 DIAGNOSIS — R350 Frequency of micturition: Secondary | ICD-10-CM | POA: Diagnosis present

## 2015-06-25 DIAGNOSIS — B9689 Other specified bacterial agents as the cause of diseases classified elsewhere: Secondary | ICD-10-CM | POA: Insufficient documentation

## 2015-06-25 DIAGNOSIS — E669 Obesity, unspecified: Secondary | ICD-10-CM | POA: Insufficient documentation

## 2015-06-25 DIAGNOSIS — Z794 Long term (current) use of insulin: Secondary | ICD-10-CM | POA: Insufficient documentation

## 2015-06-25 DIAGNOSIS — N138 Other obstructive and reflux uropathy: Secondary | ICD-10-CM

## 2015-06-25 DIAGNOSIS — Z79899 Other long term (current) drug therapy: Secondary | ICD-10-CM | POA: Diagnosis not present

## 2015-06-25 DIAGNOSIS — K5730 Diverticulosis of large intestine without perforation or abscess without bleeding: Secondary | ICD-10-CM | POA: Diagnosis not present

## 2015-06-25 DIAGNOSIS — I10 Essential (primary) hypertension: Secondary | ICD-10-CM | POA: Insufficient documentation

## 2015-06-25 DIAGNOSIS — K5900 Constipation, unspecified: Secondary | ICD-10-CM | POA: Diagnosis not present

## 2015-06-25 DIAGNOSIS — Z87891 Personal history of nicotine dependence: Secondary | ICD-10-CM | POA: Diagnosis not present

## 2015-06-25 DIAGNOSIS — N1330 Unspecified hydronephrosis: Secondary | ICD-10-CM | POA: Diagnosis not present

## 2015-06-25 DIAGNOSIS — N401 Enlarged prostate with lower urinary tract symptoms: Secondary | ICD-10-CM | POA: Diagnosis not present

## 2015-06-25 DIAGNOSIS — N410 Acute prostatitis: Secondary | ICD-10-CM | POA: Insufficient documentation

## 2015-06-25 DIAGNOSIS — I480 Paroxysmal atrial fibrillation: Secondary | ICD-10-CM | POA: Insufficient documentation

## 2015-06-25 DIAGNOSIS — N289 Disorder of kidney and ureter, unspecified: Secondary | ICD-10-CM | POA: Diagnosis not present

## 2015-06-25 DIAGNOSIS — R109 Unspecified abdominal pain: Secondary | ICD-10-CM

## 2015-06-25 LAB — URINALYSIS, ROUTINE W REFLEX MICROSCOPIC
BILIRUBIN URINE: NEGATIVE
Glucose, UA: >1000 mg/dL — AB
KETONES UR: NEGATIVE mg/dL
Leukocytes, UA: NEGATIVE
Nitrite: NEGATIVE
PROTEIN: NEGATIVE mg/dL
Specific Gravity, Urine: 1.019 (ref 1.005–1.030)
UROBILINOGEN UA: 0.2 mg/dL (ref 0.0–1.0)
pH: 5 (ref 5.0–8.0)

## 2015-06-25 LAB — BASIC METABOLIC PANEL
Anion gap: 14 (ref 5–15)
BUN: 11 mg/dL (ref 6–20)
CHLORIDE: 103 mmol/L (ref 101–111)
CO2: 23 mmol/L (ref 22–32)
CREATININE: 1.07 mg/dL (ref 0.61–1.24)
Calcium: 9.9 mg/dL (ref 8.9–10.3)
GFR calc Af Amer: >60 mL/min (ref >60)
GFR calc non Af Amer: >60 mL/min (ref >60)
Glucose, Bld: 228 mg/dL — ABNORMAL HIGH (ref 65–99)
Potassium: 3.2 mmol/L — ABNORMAL LOW (ref 3.5–5.1)
Sodium: 140 mmol/L (ref 135–145)

## 2015-06-25 LAB — CBC WITH DIFFERENTIAL/PLATELET
Basophils Absolute: 0 10*3/uL (ref 0.0–0.1)
Basophils Relative: 0 % (ref 0–1)
EOS PCT: 0 % (ref 0–5)
Eosinophils Absolute: 0 10*3/uL (ref 0.0–0.7)
HCT: 43.6 % (ref 39.0–52.0)
Hemoglobin: 14.7 g/dL (ref 13.0–17.0)
LYMPHS ABS: 1.2 10*3/uL (ref 0.7–4.0)
LYMPHS PCT: 14 % (ref 12–46)
MCH: 29.1 pg (ref 26.0–34.0)
MCHC: 33.7 g/dL (ref 30.0–36.0)
MCV: 86.2 fL (ref 78.0–100.0)
Monocytes Absolute: 0.5 10*3/uL (ref 0.1–1.0)
Monocytes Relative: 5 % (ref 3–12)
NEUTROS PCT: 81 % — AB (ref 43–77)
Neutro Abs: 7 10*3/uL (ref 1.7–7.7)
Platelets: 300 10*3/uL (ref 150–400)
RBC: 5.06 MIL/uL (ref 4.22–5.81)
RDW: 14.2 % (ref 11.5–15.5)
WBC: 8.7 10*3/uL (ref 4.0–10.5)

## 2015-06-25 LAB — URINE MICROSCOPIC-ADD ON

## 2015-06-25 NOTE — ED Provider Notes (Signed)
CSN: [id number]     Arrival date & time 06/25/15  2029 History   First MD Initiated Contact with Patient 06/25/15 2126     Chief Complaint  Patient presents with  . Urinary Retention     (Consider location/radiation/quality/duration/timing/severity/associated sxs/prior Treatment) HPI Comments: Patient is a 64 year old male with a past medical history of paroxysmal afib, hypertension, diabetes and hyperlipidemia who presents with difficulty urinating that started today. Patient reports feeling the sensation to urinate but is only able to produce a small amount of urine. He reports abdominal fullness and constipation. No aggravating/allevaiting factors. No fever, nausea, vomiting. Patient reports he has never had this problem.   Patient is a 64 y.o. male presenting with frequency. The history is provided by the patient. No language interpreter was used.  Urinary Frequency This is a new problem. The current episode started today. The problem occurs constantly. The problem has been unchanged. Associated symptoms include urinary symptoms. Pertinent negatives include no anorexia, chest pain, coughing, diaphoresis, fatigue, joint swelling, neck pain, rash, visual change, vomiting or weakness. Nothing aggravates the symptoms. He has tried nothing for the symptoms. The treatment provided no relief.    Past Medical History  Diagnosis Date  . Paroxysmal atrial fibrillation   . Hypertension   . Hyperlipidemia   . Obesity   . Diabetes   . Dysrhythmia   . Obesity   . Bilateral lower extremity edema    Past Surgical History  Procedure Laterality Date  . Cardiac catheterization    . Cardiac catheterization N/A 2012    no large vessel occlusions   Family History  Problem Relation Age of Onset  . Heart failure Brother   . Hyperlipidemia Brother   . Hypertension Brother   . Stroke Brother   . Hypertension Mother   . Hyperlipidemia Mother   . Diabetes Mother   . Cancer Father     prostate  .  GER disease Father   . Stroke Father   . Hypertension Sister   . Hyperlipidemia Sister   . Hypertension Maternal Grandmother   . Stroke Maternal Grandmother   . Diabetes Maternal Grandfather   . Hypertension Maternal Grandfather    History  Substance Use Topics  . Smoking status: Former Smoker    Quit date: 11/25/1997  . Smokeless tobacco: Not on file  . Alcohol Use: No    Review of Systems  Constitutional: Negative for diaphoresis and fatigue.  Respiratory: Negative for cough.   Cardiovascular: Negative for chest pain.  Gastrointestinal: Positive for constipation. Negative for vomiting and anorexia.  Genitourinary: Positive for frequency, decreased urine volume and difficulty urinating.  Musculoskeletal: Negative for joint swelling and neck pain.  Skin: Negative for rash.  Neurological: Negative for weakness.  All other systems reviewed and are negative.     Allergies  Review of patient's allergies indicates no known allergies.  Home Medications   Prior to Admission medications   Medication Sig Start Date End Date Taking? Authorizing Provider  allopurinol (ZYLOPRIM) 300 MG tablet  05/02/15   Historical Provider, MD  amLODipine (NORVASC) 10 MG tablet Take 5 mg by mouth daily.  10/12/13   Historical Provider, MD  aspirin 81 MG tablet Take 81 mg by mouth daily.    Historical Provider, MD  atorvastatin (LIPITOR) 40 MG tablet Take 40 mg by mouth daily at 6 PM.  11/02/13   Historical Provider, MD  carvedilol (COREG) 25 MG tablet Take 1 tablet (25 mg total) by mouth 2 (two)  times daily with a meal. 05/21/14   Jarome Matin, MD  carvedilol (COREG) 25 MG tablet TAKE ONE & ONE-HALF TABLETS BY MOUTH TWICE DAILY WITH MEALS 01/19/15   Runell Gess, MD  colchicine 0.6 MG tablet  05/02/15   Historical Provider, MD  dabigatran (PRADAXA) 150 MG CAPS Take 1 capsule (150 mg total) by mouth every 12 (twelve) hours. 04/08/13   Runell Gess, MD  Dapagliflozin Propanediol (FARXIGA) 10 MG  TABS Take 10 mg by mouth daily. 05/21/14   Jarome Matin, MD  DIOVAN 160 MG tablet Take 160 mg by mouth 2 (two) times daily.  10/04/13   Historical Provider, MD  doxazosin (CARDURA) 4 MG tablet Take 4 mg by mouth daily.  11/04/13   Historical Provider, MD  furosemide (LASIX) 40 MG tablet Take 1 tablet (40 mg total) by mouth daily. 06/15/15   Lennette Bihari, MD  HUMALOG MIX 75/25 KWIKPEN (75-25) 100 UNIT/ML Stephanie Coup  05/02/15   Historical Provider, MD  insulin NPH-regular Human (NOVOLIN 70/30) (70-30) 100 UNIT/ML injection 60 units in the morning, 50 units at lunch, and 50 units at night 05/21/14   Jarome Matin, MD  Multiple Vitamin (MULTIVITAMIN) capsule Take 1 capsule by mouth daily.    Historical Provider, MD  omeprazole (PRILOSEC OTC) 20 MG tablet Take 1 tablet (20 mg total) by mouth daily. 11/09/14   Runell Gess, MD  ONE TOUCH ULTRA TEST test strip  04/19/15   Historical Provider, MD   BP 153/89 mmHg  Pulse 85  Temp(Src) 98.5 F (36.9 C) (Oral)  Resp 18  SpO2 98% Physical Exam  Constitutional: He is oriented to person, place, and time. He appears well-developed and well-nourished. No distress.  HENT:  Head: Normocephalic and atraumatic.  Eyes: Conjunctivae and EOM are normal.  Neck: Normal range of motion.  Cardiovascular: Normal rate and regular rhythm.  Exam reveals no gallop and no friction rub.   No murmur heard. Pulmonary/Chest: Effort normal and breath sounds normal. He has no wheezes. He has no rales. He exhibits no tenderness.  Abdominal: Soft. He exhibits distension. There is no tenderness. There is no rebound and no guarding.  Musculoskeletal: Normal range of motion.  Neurological: He is alert and oriented to person, place, and time.  Speech is goal-oriented. Moves limbs without ataxia.   Skin: Skin is warm and dry.  Psychiatric: He has a normal mood and affect. His behavior is normal.  Nursing note and vitals reviewed.   ED Course  Procedures (including critical  care time) Labs Review Labs Reviewed  CBC WITH DIFFERENTIAL/PLATELET - Abnormal; Notable for the following:    Neutrophils Relative % 81 (*)    All other components within normal limits  BASIC METABOLIC PANEL - Abnormal; Notable for the following:    Potassium 3.2 (*)    Glucose, Bld 228 (*)    All other components within normal limits  URINALYSIS, ROUTINE W REFLEX MICROSCOPIC (NOT AT [facility]) - Abnormal; Notable for the following:    Glucose, UA >1000 (*)    Hgb urine dipstick MODERATE (*)    All other components within normal limits  URINE MICROSCOPIC-ADD ON    Imaging Review Dg Abd Acute W/chest  06/25/2015   CLINICAL DATA:  Constipation.  Urinary retention.  EXAM: DG ABDOMEN ACUTE W/ 1V CHEST  COMPARISON:  Chest radiographs 05/21/2015  FINDINGS: The cardiomediastinal contours are normal. The lungs are clear. Previous pulmonary edema has resolved. There is no free intra-abdominal air. No dilated bowel  loops to suggest obstruction. Moderate stool in the right colon, small volume of stool in the descending colon. No radiopaque calculi. There are pelvic phleboliths. No acute osseous abnormalities are seen.  IMPRESSION: Small to moderate volume of colonic stool. No bowel obstruction. No free air.   Electronically Signed   By: Rubye Oaks M.D.   On: 06/25/2015 22:47   Ct Renal Stone Study  06/26/2015   CLINICAL DATA:  Flank pain.  Frequent urination.  EXAM: CT ABDOMEN AND PELVIS WITHOUT CONTRAST  TECHNIQUE: Multidetector CT imaging of the abdomen and pelvis was performed following the standard protocol without IV contrast.  COMPARISON:  None.  FINDINGS: There are no urinary calculi. There is mild hydronephrosis and ureteral dilatation bilaterally. Urinary bladder is distended.  There are unremarkable unenhanced appearances of the liver, spleen, pancreas, adrenals and gallbladder. There is a 3 cm low-attenuation lesion of the lateral aspect of the left kidney which is probably a cyst although it  is not conclusively characterized. The abdominal aorta is normal in caliber. There is mild atherosclerotic calcification. There is no adenopathy in the abdomen or pelvis. There is mild uncomplicated colonic diverticulosis. There are otherwise normal appearances of the stomach, small bowel and colon. The appendix is normal. There is marked prostatic enlargement. There is a small fat containing umbilical hernia. There is no significant abnormality in the lower chest. There is no significant musculoskeletal abnormality.  IMPRESSION: 1. Mild hydronephrosis and ureteral dilatation bilaterally without evidence of an obstructing calculus or mass. There is moderate urinary bladder distention. The prostate is quite large. This could represent a bladder outlet obstruction. 2. Mild diverticulosis 3. Low-attenuation left renal lesion laterally, most likely a cyst but not conclusively characterized. Consider sonography to evaluate.   Electronically Signed   By: Ellery Plunk M.D.   On: 06/26/2015 00:19     EKG Interpretation None      MDM   Final diagnoses:  Constipation  Acute bacterial prostatitis  Enlarged prostate with urinary obstruction    9:44 PM Vitals stable and patient afebrile. Labs unremarkable for acute changes.   1:16 AM Patient's urine has hemoglobin so a CT was ordered to rule out kidney stone. CT shows large prostate and mild hydronephrosis. Patient likely has acute bacterial prostatitis which is causing his urinary retention. He has a large amount of post void urine in his bladder. Patient will have a foley catheter and a leg bag. Patient will be referred to Urology for further evaluation and management.   Emilia Beck, PA-C 06/26/15 0136  Blake Divine, MD 06/27/15 [phone]

## 2015-06-25 NOTE — ED Notes (Signed)
Pt uinable to void except in small amounts today

## 2015-06-25 NOTE — ED Notes (Signed)
Pt. reports urinary retention and oliguria onset today , denies dysuria or fever , pt. added constipation .

## 2015-06-25 NOTE — ED Notes (Signed)
To br trying to get have a bm

## 2015-06-25 NOTE — ED Notes (Signed)
Pt up to the br unablke to have a bm.  Bladder scanner battery to low toi scan

## 2015-06-25 NOTE — ED Notes (Signed)
Pt returned from xray

## 2015-06-26 MED ORDER — SULFAMETHOXAZOLE-TRIMETHOPRIM 800-160 MG PO TABS
1.0000 | ORAL_TABLET | Freq: Once | ORAL | Status: AC
Start: 2015-06-26 — End: 2015-06-26
  Administered 2015-06-26: 1 via ORAL
  Filled 2015-06-26: qty 1

## 2015-06-26 MED ORDER — SULFAMETHOXAZOLE-TRIMETHOPRIM 800-160 MG PO TABS
1.0000 | ORAL_TABLET | Freq: Two times a day (BID) | ORAL | Status: AC
Start: 2015-06-26 — End: 2015-07-03

## 2015-06-26 NOTE — Discharge Instructions (Signed)
Take Bactrim as directed until gone. Follow up with the recommended Urologist for further evaluation and management of your symptoms. Return to the ED with worsening or concerning symptoms.

## 2015-06-27 LAB — URINE CULTURE
Culture: 5000
SPECIAL REQUESTS: NORMAL

## 2015-07-05 ENCOUNTER — Telehealth: Payer: Self-pay | Admitting: *Deleted

## 2015-07-05 ENCOUNTER — Other Ambulatory Visit: Payer: Self-pay | Admitting: *Deleted

## 2015-07-05 DIAGNOSIS — G4733 Obstructive sleep apnea (adult) (pediatric): Secondary | ICD-10-CM

## 2015-07-05 NOTE — Telephone Encounter (Signed)
Received a staff message from [facility] requesting order for CPAP machine with supplies. Patient originally referred to [facility], but due to insurance has to switch [facility] companies.

## 2015-07-08 ENCOUNTER — Inpatient Hospital Stay (HOSPITAL_COMMUNITY)
Admission: EM | Admit: 2015-07-08 | Discharge: 2015-07-09 | DRG: 309 | Disposition: A | Discharge status: Home / Self Care | Payer: Medicare Other | Attending: Internal Medicine | Admitting: Internal Medicine

## 2015-07-08 ENCOUNTER — Encounter (HOSPITAL_COMMUNITY): Payer: Self-pay | Admitting: *Deleted

## 2015-07-08 ENCOUNTER — Emergency Department (HOSPITAL_COMMUNITY): Payer: Medicare Other

## 2015-07-08 DIAGNOSIS — G4733 Obstructive sleep apnea (adult) (pediatric): Secondary | ICD-10-CM | POA: Diagnosis present

## 2015-07-08 DIAGNOSIS — E1169 Type 2 diabetes mellitus with other specified complication: Secondary | ICD-10-CM

## 2015-07-08 DIAGNOSIS — R0602 Shortness of breath: Secondary | ICD-10-CM | POA: Diagnosis not present

## 2015-07-08 DIAGNOSIS — I10 Essential (primary) hypertension: Secondary | ICD-10-CM | POA: Diagnosis present

## 2015-07-08 DIAGNOSIS — N139 Obstructive and reflux uropathy, unspecified: Secondary | ICD-10-CM | POA: Diagnosis not present

## 2015-07-08 DIAGNOSIS — I4581 Long QT syndrome: Secondary | ICD-10-CM | POA: Diagnosis present

## 2015-07-08 DIAGNOSIS — I4891 Unspecified atrial fibrillation: Secondary | ICD-10-CM | POA: Diagnosis not present

## 2015-07-08 DIAGNOSIS — Z7982 Long term (current) use of aspirin: Secondary | ICD-10-CM | POA: Diagnosis not present

## 2015-07-08 DIAGNOSIS — Z466 Encounter for fitting and adjustment of urinary device: Secondary | ICD-10-CM

## 2015-07-08 DIAGNOSIS — I509 Heart failure, unspecified: Secondary | ICD-10-CM

## 2015-07-08 DIAGNOSIS — E785 Hyperlipidemia, unspecified: Secondary | ICD-10-CM | POA: Diagnosis present

## 2015-07-08 DIAGNOSIS — Z79899 Other long term (current) drug therapy: Secondary | ICD-10-CM

## 2015-07-08 DIAGNOSIS — I2510 Atherosclerotic heart disease of native coronary artery without angina pectoris: Secondary | ICD-10-CM | POA: Diagnosis present

## 2015-07-08 DIAGNOSIS — Z794 Long term (current) use of insulin: Secondary | ICD-10-CM

## 2015-07-08 DIAGNOSIS — I4892 Unspecified atrial flutter: Secondary | ICD-10-CM | POA: Diagnosis present

## 2015-07-08 DIAGNOSIS — R600 Localized edema: Secondary | ICD-10-CM | POA: Diagnosis not present

## 2015-07-08 DIAGNOSIS — E119 Type 2 diabetes mellitus without complications: Secondary | ICD-10-CM | POA: Diagnosis present

## 2015-07-08 DIAGNOSIS — Z87891 Personal history of nicotine dependence: Secondary | ICD-10-CM | POA: Diagnosis not present

## 2015-07-08 DIAGNOSIS — I5031 Acute diastolic (congestive) heart failure: Secondary | ICD-10-CM | POA: Diagnosis not present

## 2015-07-08 DIAGNOSIS — Z7901 Long term (current) use of anticoagulants: Secondary | ICD-10-CM | POA: Diagnosis not present

## 2015-07-08 DIAGNOSIS — R9431 Abnormal electrocardiogram [ECG] [EKG]: Secondary | ICD-10-CM | POA: Diagnosis present

## 2015-07-08 DIAGNOSIS — Z6841 Body Mass Index (BMI) 40.0 and over, adult: Secondary | ICD-10-CM

## 2015-07-08 DIAGNOSIS — I248 Other forms of acute ischemic heart disease: Secondary | ICD-10-CM | POA: Diagnosis present

## 2015-07-08 DIAGNOSIS — I480 Paroxysmal atrial fibrillation: Principal | ICD-10-CM | POA: Diagnosis present

## 2015-07-08 DIAGNOSIS — R079 Chest pain, unspecified: Secondary | ICD-10-CM | POA: Diagnosis not present

## 2015-07-08 HISTORY — DX: Unspecified atrial fibrillation: I48.91

## 2015-07-08 LAB — CBC
HEMATOCRIT: 44.8 % (ref 39.0–52.0)
HEMOGLOBIN: 14.7 g/dL (ref 13.0–17.0)
MCH: 28.8 pg (ref 26.0–34.0)
MCHC: 32.8 g/dL (ref 30.0–36.0)
MCV: 87.7 fL (ref 78.0–100.0)
PLATELETS: 323 10*3/uL (ref 150–400)
RBC: 5.11 MIL/uL (ref 4.22–5.81)
RDW: 13.8 % (ref 11.5–15.5)
WBC: 6.8 10*3/uL (ref 4.0–10.5)

## 2015-07-08 LAB — BASIC METABOLIC PANEL
ANION GAP: 9 (ref 5–15)
BUN: 10 mg/dL (ref 6–20)
CO2: 25 mmol/L (ref 22–32)
CREATININE: 0.89 mg/dL (ref 0.61–1.24)
Calcium: 9.7 mg/dL (ref 8.9–10.3)
Chloride: 105 mmol/L (ref 101–111)
GFR calc Af Amer: >60 mL/min (ref >60)
Glucose, Bld: 98 mg/dL (ref 65–99)
POTASSIUM: 4 mmol/L (ref 3.5–5.1)
SODIUM: 139 mmol/L (ref 135–145)

## 2015-07-08 LAB — URINALYSIS, ROUTINE W REFLEX MICROSCOPIC
Bilirubin Urine: NEGATIVE
Glucose, UA: NEGATIVE mg/dL
KETONES UR: 15 mg/dL — AB
LEUKOCYTES UA: NEGATIVE
Nitrite: NEGATIVE
SPECIFIC GRAVITY, URINE: 1.017 (ref 1.005–1.030)
Urobilinogen, UA: 0.2 mg/dL (ref 0.0–1.0)
pH: 6 (ref 5.0–8.0)

## 2015-07-08 LAB — MAGNESIUM: Magnesium: 1.6 mg/dL — ABNORMAL LOW (ref 1.7–2.4)

## 2015-07-08 LAB — URINE MICROSCOPIC-ADD ON

## 2015-07-08 LAB — I-STAT TROPONIN, ED: Troponin i, poc: 0.03 ng/mL (ref 0.00–0.08)

## 2015-07-08 LAB — TROPONIN I
Troponin I: 0.16 ng/mL — ABNORMAL HIGH (ref <0.031)
Troponin I: 0.15 ng/mL — ABNORMAL HIGH (ref <0.031)

## 2015-07-08 LAB — GLUCOSE, CAPILLARY
GLUCOSE-CAPILLARY: 178 mg/dL — AB (ref 65–99)
GLUCOSE-CAPILLARY: 174 mg/dL — AB (ref 65–99)
Glucose-Capillary: 164 mg/dL — ABNORMAL HIGH (ref 65–99)

## 2015-07-08 LAB — MRSA PCR SCREENING: MRSA by PCR: NEGATIVE

## 2015-07-08 LAB — CBG MONITORING, ED: GLUCOSE-CAPILLARY: 106 mg/dL — AB (ref 65–99)

## 2015-07-08 LAB — BRAIN NATRIURETIC PEPTIDE: B Natriuretic Peptide: 74.8 pg/mL (ref 0.0–100.0)

## 2015-07-08 MED ORDER — MAGNESIUM SULFATE IN D5W 10-5 MG/ML-% IV SOLN
1.0000 g | INTRAVENOUS | Status: AC
  Administered 2015-07-08: 1 g via INTRAVENOUS
  Filled 2015-07-08: qty 100

## 2015-07-08 MED ORDER — DABIGATRAN ETEXILATE MESYLATE 150 MG PO CAPS
150.0000 mg | ORAL_CAPSULE | Freq: Two times a day (BID) | ORAL | Status: DC
  Administered 2015-07-08 – 2015-07-09 (×3): 150 mg via ORAL
  Filled 2015-07-08 (×4): qty 1

## 2015-07-08 MED ORDER — ASPIRIN 81 MG PO CHEW
81.0000 mg | CHEWABLE_TABLET | Freq: Every day | ORAL | Status: DC
  Administered 2015-07-08 – 2015-07-09 (×2): 81 mg via ORAL
  Filled 2015-07-08 (×2): qty 1

## 2015-07-08 MED ORDER — CARVEDILOL 25 MG PO TABS
25.0000 mg | ORAL_TABLET | Freq: Two times a day (BID) | ORAL | Status: DC
  Administered 2015-07-09: 25 mg via ORAL
  Filled 2015-07-08: qty 1

## 2015-07-08 MED ORDER — DILTIAZEM HCL 100 MG IV SOLR: 5.0000 mg/h | INTRAVENOUS | Status: AC

## 2015-07-08 MED ORDER — ASPIRIN 81 MG PO TABS: 81.0000 mg | ORAL_TABLET | Freq: Every day | ORAL | Status: DC

## 2015-07-08 MED ORDER — ALLOPURINOL 300 MG PO TABS
300.0000 mg | ORAL_TABLET | Freq: Every day | ORAL | Status: DC
  Administered 2015-07-08 – 2015-07-09 (×2): 300 mg via ORAL
  Filled 2015-07-08 (×2): qty 1

## 2015-07-08 MED ORDER — OMEPRAZOLE MAGNESIUM 20 MG PO TBEC: 20.0000 mg | DELAYED_RELEASE_TABLET | Freq: Every day | ORAL | Status: DC

## 2015-07-08 MED ORDER — DILTIAZEM HCL ER 120 MG PO CP24
120.0000 mg | ORAL_CAPSULE | ORAL | Status: DC
  Administered 2015-07-08: 120 mg via ORAL
  Filled 2015-07-08 (×3): qty 1

## 2015-07-08 MED ORDER — DILTIAZEM LOAD VIA INFUSION
20.0000 mg | Freq: Once | INTRAVENOUS | Status: AC
  Administered 2015-07-08: 20 mg via INTRAVENOUS
  Filled 2015-07-08: qty 20

## 2015-07-08 MED ORDER — ATORVASTATIN CALCIUM 40 MG PO TABS
40.0000 mg | ORAL_TABLET | Freq: Every day | ORAL | Status: DC
  Administered 2015-07-08: 40 mg via ORAL
  Filled 2015-07-08: qty 1

## 2015-07-08 MED ORDER — COLCHICINE 0.6 MG PO TABS
0.6000 mg | ORAL_TABLET | Freq: Every day | ORAL | Status: DC
  Administered 2015-07-08 – 2015-07-09 (×2): 0.6 mg via ORAL
  Filled 2015-07-08 (×2): qty 1

## 2015-07-08 MED ORDER — ONDANSETRON HCL 4 MG/2ML IJ SOLN: 4.0000 mg | Freq: Four times a day (QID) | INTRAMUSCULAR | Status: DC | PRN

## 2015-07-08 MED ORDER — ADULT MULTIVITAMIN W/MINERALS CH
1.0000 | ORAL_TABLET | Freq: Every day | ORAL | Status: DC
  Administered 2015-07-08 – 2015-07-09 (×2): 1 via ORAL
  Filled 2015-07-08 (×2): qty 1

## 2015-07-08 MED ORDER — IRBESARTAN 150 MG PO TABS
150.0000 mg | ORAL_TABLET | Freq: Every day | ORAL | Status: DC
  Administered 2015-07-08 – 2015-07-09 (×2): 150 mg via ORAL
  Filled 2015-07-08 (×2): qty 1

## 2015-07-08 MED ORDER — DILTIAZEM HCL 100 MG IV SOLR
5.0000 mg/h | INTRAVENOUS | Status: DC
  Administered 2015-07-08: 5 mg/h via INTRAVENOUS
  Administered 2015-07-08: 12.5 mg/h via INTRAVENOUS
  Filled 2015-07-08 (×2): qty 100

## 2015-07-08 MED ORDER — DOXAZOSIN MESYLATE 4 MG PO TABS
4.0000 mg | ORAL_TABLET | Freq: Every day | ORAL | Status: DC
  Administered 2015-07-08 – 2015-07-09 (×2): 4 mg via ORAL
  Filled 2015-07-08 (×2): qty 1

## 2015-07-08 MED ORDER — INSULIN GLARGINE 100 UNIT/ML ~~LOC~~ SOLN
25.0000 [IU] | Freq: Two times a day (BID) | SUBCUTANEOUS | Status: DC
  Administered 2015-07-08 – 2015-07-09 (×2): 25 [IU] via SUBCUTANEOUS
  Filled 2015-07-08 (×3): qty 0.25

## 2015-07-08 MED ORDER — ACETAMINOPHEN 325 MG PO TABS: 650.0000 mg | ORAL_TABLET | ORAL | Status: DC | PRN

## 2015-07-08 MED ORDER — INSULIN ASPART 100 UNIT/ML ~~LOC~~ SOLN
0.0000 [IU] | Freq: Three times a day (TID) | SUBCUTANEOUS | Status: DC
  Administered 2015-07-08 – 2015-07-09 (×3): 4 [IU] via SUBCUTANEOUS

## 2015-07-08 MED ORDER — INSULIN ASPART 100 UNIT/ML ~~LOC~~ SOLN
10.0000 [IU] | Freq: Three times a day (TID) | SUBCUTANEOUS | Status: DC
  Administered 2015-07-08 – 2015-07-09 (×4): 10 [IU] via SUBCUTANEOUS

## 2015-07-08 MED ORDER — ASPIRIN 81 MG PO CHEW
324.0000 mg | CHEWABLE_TABLET | Freq: Once | ORAL | Status: AC
  Administered 2015-07-08: 324 mg via ORAL
  Filled 2015-07-08: qty 4

## 2015-07-08 MED ORDER — PANTOPRAZOLE SODIUM 20 MG PO TBEC
20.0000 mg | DELAYED_RELEASE_TABLET | Freq: Every day | ORAL | Status: DC
  Administered 2015-07-08 – 2015-07-09 (×2): 20 mg via ORAL
  Filled 2015-07-08 (×2): qty 1

## 2015-07-08 NOTE — ED Notes (Signed)
Pt going in and out of afib with RVR/ NSR.  BP stable.  Cardizem increased to 10 mg/hr per PA.

## 2015-07-08 NOTE — ED Notes (Signed)
BreakfaST tray ordered

## 2015-07-08 NOTE — Progress Notes (Signed)
UR COMPLETED  

## 2015-07-08 NOTE — Consult Note (Addendum)
Admit date: 07/08/2015 Referring Physician  Dr. Elesa Massed (ER) Primary Physician Gaspar Garbe, MD Primary Cardiologist  Dr. Allyson Sabal Reason for Consultation  AFIB paroxysmal  HPI: 65 old male with paroxysmal atrial fibrillation, hypertension, obesity, obstructive sleep apnea, diabetes who presented to the emergency department with atrial fibrillation, rapid ventricular response with underlying chest discomfort.  Back in 07/17/2011 he also expressed chest pain and atrial fibrillation and underwent cardiac catheterization on 07/18/2011 by Dr. Rennis Golden revealing mild/minimal luminal irregularities with small vessel disease and normal LV function. He spontaneously converted to sinus rhythm at that time and was placed on Pradaxa.  While here in the emergency department, he was given additional IV diltiazem drip and spontaneously converted per report. His chest discomfort has subsided. Very similar to prior presentation in 2012. When looking at telemetry, he does have brief periods of sinus rhythm, also has periods of atrial fibrillation once again. While we were examining him, he was in atrial fibrillation with heart rate in the 120s then converted to the 70s with sinus rhythm then back to what look like atrial fibrillation.  Troponin thus far is normal.  Original EKG shows atrial fibrillation with rapid ventricular response heart rate of 169 with nonspecific ST-T wave changes likely secondary to underlying tachycardia. This EKG was at 6:19 AM  Second EKG at 7:47 AM shows heart rate of 139 bpm with regular intervals, possibly atrial flutter, 2-1 conduction, versus sinus tachycardia.  Currently he is without any significant discomfort. Over the last 2 weeks he has had a Foley catheter in place because of urinary retention. Denies any recent fevers, chills, diarrhea, syncope.   PMH:   Past Medical History  Diagnosis Date  . Paroxysmal atrial fibrillation   . Hypertension   . Hyperlipidemia   .  Obesity   . Diabetes   . Dysrhythmia   . Obesity   . Bilateral lower extremity edema     PSH:   Past Surgical History  Procedure Laterality Date  . Cardiac catheterization    . Cardiac catheterization N/A 2012    no large vessel occlusions   Allergies:  Review of patient's allergies indicates no known allergies. Prior to Admit Meds:   Prior to Admission medications   Medication Sig Start Date End Date Taking? Authorizing Provider  allopurinol (ZYLOPRIM) 300 MG tablet Take 300 mg by mouth daily.  05/02/15  Yes Historical Provider, MD  amLODipine (NORVASC) 10 MG tablet Take 5 mg by mouth daily.  10/12/13  Yes Historical Provider, MD  aspirin 81 MG tablet Take 81 mg by mouth daily.   Yes Historical Provider, MD  atorvastatin (LIPITOR) 40 MG tablet Take 40 mg by mouth daily at 6 PM.  11/02/13  Yes Historical Provider, MD  carvedilol (COREG) 25 MG tablet Take 1 tablet (25 mg total) by mouth 2 (two) times daily with a meal. 05/21/14  Yes Jarome Matin, MD  colchicine 0.6 MG tablet Take 0.6 mg by mouth daily.  05/02/15  Yes Historical Provider, MD  dabigatran (PRADAXA) 150 MG CAPS Take 1 capsule (150 mg total) by mouth every 12 (twelve) hours. 04/08/13  Yes Runell Gess, MD  Dapagliflozin Propanediol (FARXIGA) 10 MG TABS Take 10 mg by mouth daily. 05/21/14  Yes Jarome Matin, MD  DIOVAN 160 MG tablet Take 160 mg by mouth 2 (two) times daily.  10/04/13  Yes Historical Provider, MD  doxazosin (CARDURA) 4 MG tablet Take 4 mg by mouth daily.  11/04/13  Yes Historical Provider, MD  furosemide (LASIX) 40 MG tablet Take 1 tablet (40 mg total) by mouth daily. 06/15/15  Yes Lennette Bihari, MD  HUMALOG MIX 75/25 KWIKPEN (75-25) 100 UNIT/ML Kwikpen Inject 30-60 Units into the skin See admin instructions. Take 60 units in the morning, 30 units at lunch and 40 units in the evening 05/02/15  Yes Historical Provider, MD  Multiple Vitamin (MULTIVITAMIN WITH MINERALS) TABS tablet Take 1 tablet by mouth daily.    Yes Historical Provider, MD  omeprazole (PRILOSEC OTC) 20 MG tablet Take 1 tablet (20 mg total) by mouth daily. 11/09/14  Yes Runell Gess, MD  insulin NPH-regular Human (NOVOLIN 70/30) (70-30) 100 UNIT/ML injection 60 units in the morning, 50 units at lunch, and 50 units at night Patient not taking: Reported on 06/25/2015 05/21/14   Jarome Matin, MD  ONE TOUCH ULTRA TEST test strip  04/19/15   Historical Provider, MD   Fam HX:    Family History  Problem Relation Age of Onset  . Heart failure Brother   . Hyperlipidemia Brother   . Hypertension Brother   . Stroke Brother   . Hypertension Mother   . Hyperlipidemia Mother   . Diabetes Mother   . Cancer Father     prostate  . GER disease Father   . Stroke Father   . Hypertension Sister   . Hyperlipidemia Sister   . Hypertension Maternal Grandmother   . Stroke Maternal Grandmother   . Diabetes Maternal Grandfather   . Hypertension Maternal Grandfather    Social HX:    Social History   Social History  . Marital Status: Single    Spouse Name: N/A  . Number of Children: N/A  . Years of Education: N/A   Occupational History  . Not on file.   Social History Main Topics  . Smoking status: Former Smoker    Quit date: 11/25/1997  . Smokeless tobacco: Not on file  . Alcohol Use: No  . Drug Use: No  . Sexual Activity: Not on file   Other Topics Concern  . Not on file   Social History Narrative     ROS:  All 11 ROS were addressed and are negative except what is stated in the HPI   Physical Exam: Blood pressure 128/74, pulse 141, temperature 98.2 F (36.8 C), temperature source Oral, resp. rate 25, height 5\' 5"  (1.651 m), weight 255 lb (115.667 kg), SpO2 99 %.   General: Well developed, well nourished, in no acute distress Head: Eyes PERRLA, No xanthomas.   Normal cephalic and atramatic  Lungs:   Clear bilaterally to auscultation and percussion. Normal respiratory effort. No wheezes, no rales. Heart:   Tachycardic,  regular Pulses are 2+ & equal. No murmur, rubs, gallops.  No carotid bruit. No JVD.  No abdominal bruits.  Abdomen: Bowel sounds are positive, abdomen soft and non-tender without masses. No hepatosplenomegaly. Obese Msk:  Back normal. Normal strength and tone for age. Extremities:  No clubbing, cyanosis, 2-3+ chronic appearing lower extremity edema.  DP +1 Neuro: Alert and oriented X 3, non-focal, MAE x 4 GU: Deferred Rectal: Deferred Psych:  Good affect, responds appropriately      Labs: Lab Results  Component Value Date   WBC PENDING 07/08/2015   HGB 14.7 07/08/2015   HCT 44.8 07/08/2015   MCV 87.7 07/08/2015   PLT 323 07/08/2015     Recent Labs Lab 07/08/15 0634  NA 139  K 4.0  CL 105  CO2 25  BUN 10  CREATININE 0.89  CALCIUM 9.7  GLUCOSE 98   No results for input(s): CKTOTAL, CKMB, TROPONINI in the last 72 hours. Lab Results  Component Value Date   CHOL 229* 07/18/2011   HDL 46 07/18/2011   LDLCALC 163* 07/18/2011   TRIG 101 07/18/2011   No results found for: DDIMER   Radiology:  Dg Chest Port 1 View  07/08/2015   CLINICAL DATA:  Chest discomfort and shortness of breath. Left arm tingling.  EXAM: PORTABLE CHEST - 1 VIEW  COMPARISON:  06/25/2015  FINDINGS: The heart size and mediastinal contours are within normal limits. Both lungs are clear. The visualized skeletal structures are unremarkable.  IMPRESSION: No active disease.   Electronically Signed   By: Elige Ko   On: 07/08/2015 07:15   Personally viewed.  EKG:  Multiple EKGs reviewed as above Personally viewed. EKG from 12/09/14 demonstrates clear sinus rhythm heart rate in the 70s with PVC Cardiac catheterization reviewed as above, no flow-limiting CAD in 2012, normal ejection fraction  Troponin normal. Creatinine 0.89.  ASSESSMENT/PLAN:    64 year old male with atrial fibrillation, paroxysmal with rapid ventricular response, morbid obesity.  1. Atrial fibrillation with rapid ventricular response  -  First EKG clearly atrial fibrillation with heart rate of 169 bpm  - Second EKG likely atrial flutter with 2-1 conduction with heart rate of 139 bpm  - Agree with diltiazem IV for further rate control, can further increase dosage up to 15 mg if necessary.  - Continuation of chronic anticoagulation with Pradaxa  - If he does not successfully convert to sinus rhythm, we can consider cardioversion.   - Stop amlodipine while placing on diltiazem.  - It is likely that he will require concomitant carvedilol 25 mg twice a day in addition to new start diltiazem.  - Perhaps his indwelling Foley catheter secondary to urinary retention is causing urinary tract infection. Further analysis to be performed by primary team.  2. Morbid obesity  - Encourage weight loss  - Playing a role in his atrial fibrillation  3. Obstructive sleep apnea  - CPAP  4. Chest pain  - Likely related to atrial fibrillation symptomatology  - Previous hospitalization was very similar in 2012 which resulted in cardiac catheterization, nonobstructive CAD was noted, small vessel disease. Medical management. Thus far, troponin is normal.  We will follow along with you.  Donato Schultz, MD  07/08/2015  8:34 AM

## 2015-07-08 NOTE — H&P (Signed)
[facility] History and Physical                                                                                    Raymond Gordon, is a 64 y.o. male  MRN: [id number]   DOB - [birth year]  Admit Date - 07/08/2015  Outpatient Primary MD for the patient is Gaspar Garbe, MD  Referring Physician:  Betsy Coder, PA-C  Chief Complaint:   Chief Complaint  Patient presents with  . Tachycardia     HPI  Raymond Gordon  is a 64 y.o. male, with diabetes mellitus, paroxysmal atrial fibrillation, severe obstructive sleep apnea, and recent diagnosis of acute urinary retention with possible bacterial prostatitis on 06/26/2015.  Raymond Gordon presents to the ER this morning with atrial fibrillation and RVR. He reports waking at 4:30 this morning with palpitations, chest tightness and associated shortness of breath, and left arm numbness and tingling. The chest tightness is centralized and has eased with treatment here in the ER. It is similar to the chest tightness that he has had intermittently over the last 4 years. He reports mildly increased dyspnea on exertion recently. He states he is wearing his C Pap at night and is compliant with his medications including Pradaxa.  2 weeks ago he presented to the emergency department with acute urinary obstruction. A Foley catheter was put in place and he was treated with Bactrim for 1 week. He is scheduled to see Dr. Brunilda Payor of [facility] on August 24.  In the emergency department his heart rate ranges between 39 and 170. Respirations have been elevated as well. Fortunately serologies are within normal limits-including BNP and point-of-care troponin. Urinary analysis is pending.  Review of Systems   In addition to the HPI above,  No Fever-chills, No Headache, No changes with Vision or hearing, No problems swallowing food or Liquids, No Abdominal pain, No Nausea or Vomiting, Bowel movements are slightly difficult to pass No Blood in stool or Urine, No new skin  rashes or bruises, No new joints pains-aches,  No new weakness, tingling, numbness in any extremity, No recent weight gain or loss, A full 10 point Review of Systems was done, except as stated above, all other Review of Systems were negative.  Past Medical History  Past Medical History  Diagnosis Date  . Paroxysmal atrial fibrillation   . Hypertension   . Hyperlipidemia   . Obesity   . Diabetes   . Dysrhythmia   . Obesity   . Bilateral lower extremity edema     Past Surgical History  Procedure Laterality Date  . Cardiac catheterization    . Cardiac catheterization N/A 2012    no large vessel occlusions      Social History Social History  Substance Use Topics  . Smoking status: Former Smoker    Quit date: 11/25/1997  . Smokeless tobacco: Not on file  . Alcohol Use: No  He lives with his mother. He used tobacco for approximately 35 years and then stopped. He drank alcohol heavily on the weekends for approximately 25 years and then stopped.  He is retired from work and per his family he is generally sedentary.  Family History Family History  Problem Relation Age of Onset  . Heart failure Brother   . Hyperlipidemia Brother   . Hypertension Brother   . Stroke Brother   . Hypertension Mother   . Hyperlipidemia Mother   . Diabetes Mother   . Cancer Father     prostate  . GER disease Father   . Stroke Father   . Hypertension Sister   . Hyperlipidemia Sister   . Hypertension Maternal Grandmother   . Stroke Maternal Grandmother   . Diabetes Maternal Grandfather   . Hypertension Maternal Grandfather    his father had coronary artery bypass graft  Prior to Admission medications   Medication Sig Start Date End Date Taking? Authorizing Provider  allopurinol (ZYLOPRIM) 300 MG tablet Take 300 mg by mouth daily.  05/02/15  Yes Historical Provider, MD  aspirin 81 MG tablet Take 81 mg by mouth daily.   Yes Historical Provider, MD  atorvastatin (LIPITOR) 40 MG tablet Take 40  mg by mouth daily at 6 PM.  11/02/13  Yes Historical Provider, MD  carvedilol (COREG) 25 MG tablet Take 1 tablet (25 mg total) by mouth 2 (two) times daily with a meal. 05/21/14  Yes Jarome Matin, MD  colchicine 0.6 MG tablet Take 0.6 mg by mouth daily.  05/02/15  Yes Historical Provider, MD  dabigatran (PRADAXA) 150 MG CAPS Take 1 capsule (150 mg total) by mouth every 12 (twelve) hours. 04/08/13  Yes Runell Gess, MD  Dapagliflozin Propanediol (FARXIGA) 10 MG TABS Take 10 mg by mouth daily. 05/21/14  Yes Jarome Matin, MD  DIOVAN 160 MG tablet Take 160 mg by mouth 2 (two) times daily.  10/04/13  Yes Historical Provider, MD  doxazosin (CARDURA) 4 MG tablet Take 4 mg by mouth daily.  11/04/13  Yes Historical Provider, MD  furosemide (LASIX) 40 MG tablet Take 1 tablet (40 mg total) by mouth daily. 06/15/15  Yes Lennette Bihari, MD  HUMALOG MIX 75/25 KWIKPEN (75-25) 100 UNIT/ML Kwikpen Inject 30-60 Units into the skin See admin instructions. Take 60 units in the morning, 30 units at lunch and 40 units in the evening 05/02/15  Yes Historical Provider, MD  Multiple Vitamin (MULTIVITAMIN WITH MINERALS) TABS tablet Take 1 tablet by mouth daily.   Yes Historical Provider, MD  omeprazole (PRILOSEC OTC) 20 MG tablet Take 1 tablet (20 mg total) by mouth daily. 11/09/14  Yes Runell Gess, MD  ONE TOUCH ULTRA TEST test strip  04/19/15   Historical Provider, MD    No Known Allergies  Physical Exam  Vitals  Blood pressure 144/81, pulse 142, temperature 98.2 F (36.8 C), temperature source Oral, resp. rate 28, height 5\' 5"  (1.651 m), weight 115.667 kg (255 lb), SpO2 99 %.   General: Obese, slightly childlike, African-American male lying in bed.  Slightly diaphoretic, smells of urine, mother and sister at bedside  Psych:  Normal affect and insight, Not Suicidal or Homicidal, Awake Alert, Oriented X 3.  Neuro:   No F.N deficits, ALL C.Nerves Intact, Strength 5/5 all 4 extremities, Sensation intact all 4  extremities.  ENT:  Ears and Eyes appear Normal, Conjunctivae clear, PER. Moist oral mucosa without erythema or exudates.  Neck:  Supple, No lymphadenopathy appreciated  Respiratory:  Symmetrical chest wall movement, Good air movement bilaterally, CTAB.  Cardiac:  RRR, No Murmurs, Chronic appearing bilateral lower extremity edema.   Abdomen:  Positive bowel sounds, Soft, Non tender, Non distended,  No masses appreciated  Skin:  No Cyanosis, Normal Skin Turgor, No Bruise. Rash of black papules on his back and face appears chronic  Extremities:  Able to move all 4. 5/5 strength in each,  no effusions.  Data Review  CBC  Recent Labs Lab 07/08/15 0634  WBC 6.8  HGB 14.7  HCT 44.8  PLT 323  MCV 87.7  MCH 28.8  MCHC 32.8  RDW 13.8    Chemistries   Recent Labs Lab 07/08/15 0634  NA 139  K 4.0  CL 105  CO2 25  GLUCOSE 98  BUN 10  CREATININE 0.89  CALCIUM 9.7  MG 1.6*    Urinalysis - pending  Imaging results:   Dg Chest Port 1 View  07/08/2015   CLINICAL DATA:  Chest discomfort and shortness of breath. Left arm tingling.  EXAM: PORTABLE CHEST - 1 VIEW  COMPARISON:  06/25/2015  FINDINGS: The heart size and mediastinal contours are within normal limits. Both lungs are clear. The visualized skeletal structures are unremarkable.  IMPRESSION: No active disease.   Electronically Signed   By: Elige Ko   On: 07/08/2015 07:15   Dg Abd Acute W/chest  06/25/2015   CLINICAL DATA:  Constipation.  Urinary retention.  EXAM: DG ABDOMEN ACUTE W/ 1V CHEST  COMPARISON:  Chest radiographs 05/21/2015  FINDINGS: The cardiomediastinal contours are normal. The lungs are clear. Previous pulmonary edema has resolved. There is no free intra-abdominal air. No dilated bowel loops to suggest obstruction. Moderate stool in the right colon, small volume of stool in the descending colon. No radiopaque calculi. There are pelvic phleboliths. No acute osseous abnormalities are seen.  IMPRESSION:  Small to moderate volume of colonic stool. No bowel obstruction. No free air.   Electronically Signed   By: Rubye Oaks M.D.   On: 06/25/2015 22:47   Ct Renal Stone Study  06/26/2015   CLINICAL DATA:  Flank pain.  Frequent urination.  EXAM: CT ABDOMEN AND PELVIS WITHOUT CONTRAST  TECHNIQUE: Multidetector CT imaging of the abdomen and pelvis was performed following the standard protocol without IV contrast.  COMPARISON:  None.  FINDINGS: There are no urinary calculi. There is mild hydronephrosis and ureteral dilatation bilaterally. Urinary bladder is distended.  There are unremarkable unenhanced appearances of the liver, spleen, pancreas, adrenals and gallbladder. There is a 3 cm low-attenuation lesion of the lateral aspect of the left kidney which is probably a cyst although it is not conclusively characterized. The abdominal aorta is normal in caliber. There is mild atherosclerotic calcification. There is no adenopathy in the abdomen or pelvis. There is mild uncomplicated colonic diverticulosis. There are otherwise normal appearances of the stomach, small bowel and colon. The appendix is normal. There is marked prostatic enlargement. There is a small fat containing umbilical hernia. There is no significant abnormality in the lower chest. There is no significant musculoskeletal abnormality.  IMPRESSION: 1. Mild hydronephrosis and ureteral dilatation bilaterally without evidence of an obstructing calculus or mass. There is moderate urinary bladder distention. The prostate is quite large. This could represent a bladder outlet obstruction. 2. Mild diverticulosis 3. Low-attenuation left renal lesion laterally, most likely a cyst but not conclusively characterized. Consider sonography to evaluate.   Electronically Signed   By: Ellery Plunk M.D.   On: 06/26/2015 00:19    My personal review of EKG: sinus tach, prolonged QT.   Assessment & Plan  Principal Problem:   Atrial fibrillation with RVR Active  Problems:   Morbid obesity   Diabetes   CHF (  congestive heart failure), NYHA class I   Severe OSA (obstructive sleep apnea)   Bilateral lower extremity edema   Acute urinary obstruction   Prolonged Q-T interval on ECG  A. fib with RVR and associated chest tightness Patient with history of same. Uncertain what caused this acute exacerbation-possibly recent urinary tract infection versus ongoing severe objective sleep apnea. BMP is within normal limits. Diabetes appears controlled. We will admit to stepdown, placed on diltiazem drip, continue Protonix and Coreg, cycle troponins, check 2-D echo.  Greatly appreciate cardiology evaluation and recommendations.  Severe obstructive sleep apnea C Pap daily at bedtime  Diabetes mellitus On Humalog, NPA and Farxiga at home. Takes 120 units of insulin daily spread out over 3 meals. Will place on basal bolus regimen while inpatient. Carb mod/heart healthy diet Checking globin A1c. Dr. Wylene Simmer manages his diabetes  Acute urinary obstruction Diagnosed in ER on 8/1. Received 7 days of Bactrim. Has ongoing Foley in place. Will check UA and culture. Scheduled to see Dr. Brunilda Payor on 8/24.  Bilateral lower extremity edema. Chronic. Per patient appears improved. Given current A. fib with RVR will hold his home dose of Lasix (40 mg daily). He was recently changed from HCTZ to Lasix on 06/15/15 by Dr. Tresa Endo. Will defer to cardiology for their recommendations.  Morbid obesity.  Consultants Called: Cardiology, Seen by Dr. Anne Fu  Family Communication:   Mother and sister at bedside  Code Status:  Full code  Condition:  Guarded  Potential Disposition: To home in 48-72 hours or when okay with cardiology  Time spent in minutes : 60   Algis Downs,  PA-C on 07/08/2015 at 9:54 AM Between 7am to 7pm - Pager - [phone] After 7pm go to [email or web address] - password [email or web address] And look for the night coverage person covering me after hours  [facility]

## 2015-07-08 NOTE — Progress Notes (Signed)
Central telemetry notified that pt had two pauses at 1214 and 1235, pt asymptomatic, MD notifed, no new orders  Received. Raymon Mutton RN

## 2015-07-08 NOTE — ED Notes (Signed)
Dr Elesa Massed states to leave dilt at 5 mg/hr seeing as pt is in ST, no longer in afib.  Notified of bp.

## 2015-07-08 NOTE — Progress Notes (Signed)
Pt has been in NSR for one hour, heart rate in 60s, MD notified, new orders received and implemented.  Raymon Mutton RN

## 2015-07-08 NOTE — ED Notes (Signed)
Pt. Woke up this morning about 0430 and felt his heart racing. Pt. C/o chest discomfort and SOB. Pt. C/o chest tightness and left arm tingling.

## 2015-07-08 NOTE — ED Provider Notes (Addendum)
Medical screening examination/treatment/procedure(s) were conducted as a shared visit with non-physician practitioner(s) and myself.  I personally evaluated the patient during the encounter.   EKG Interpretation   Date/Time:  Saturday July 08 2015 06:19:30 EDT Ventricular Rate:  163 PR Interval:    QRS Duration: 99 QT Interval:  289 QTC Calculation: 476 R Axis:   49 Text Interpretation:  Atrial fibrillation with rapid V-rate Abnormal  R-wave progression, early transition Repolarization abnormality, prob rate  related Confirmed by Korvin Valentine,  DO, Ludmila Ebarb [phone]) on 07/08/2015 6:27:44 AM      Pt is a 64 y.o. male with history of atrial for ablation currently on Coreg and Pradaxa who presents to the emergency department with palpitations, chest pain, shortness of breath that started at 4:30 this morning while at rest. Patient has a heart rate in the 170s and is hypertensive. He is in atrial fibrillation with RVR. Started diltiazem drip. Troponin negative. Will admit.  Layla Maw Holbert Caples, DO 07/08/15 0725   EKG Interpretation  Date/Time:  Saturday July 08 2015 07:47:54 EDT Ventricular Rate:  139 PR Interval:  119 QRS Duration: 105 QT Interval:  404 QTC Calculation: 614 R Axis:   27 Text Interpretation:  Sinus tachycardia Abnormal R-wave progression, early transition Borderline ST elevation, lateral leads Prolonged QT interval Confirmed by Abdifatah Colquhoun,  DO, Lawayne Hartig [phone]) on 07/08/2015 8:08:22 AM        Layla Maw Lunetta Marina, DO 07/08/15 0981

## 2015-07-08 NOTE — ED Provider Notes (Signed)
CSN: [id number]     Arrival date & time 07/08/15  0612 History   First MD Initiated Contact with Patient 07/08/15 [phone]     Chief Complaint  Patient presents with  . Tachycardia    Raymond Gordon is a 64 y.o. male with a history of paroxysmal a-fib on pradaxa and carvedilol, diabetes, HTN, and lower extremity edema who presents to the ED complaining of chest pain, shortness of breath and palpitations since 0430 this am. He also reports having some left arm tingling this am that has since resolved. He reports he currently has an indwelling catheter for urinary retention. His primary cardiologist is Nanetta Batty, MD. The patient denies fevers, recent illness, dizziness, lightheadedness, nausea, vomiting, abdominal pain, sweating, numbness, tingling, cough, wheezing, syncope or rashes.   (Consider location/radiation/quality/duration/timing/severity/associated sxs/prior Treatment) HPI  Past Medical History  Diagnosis Date  . Paroxysmal atrial fibrillation   . Hypertension   . Hyperlipidemia   . Obesity   . Diabetes   . Dysrhythmia   . Obesity   . Bilateral lower extremity edema    Past Surgical History  Procedure Laterality Date  . Cardiac catheterization    . Cardiac catheterization N/A 2012    no large vessel occlusions   Family History  Problem Relation Age of Onset  . Heart failure Brother   . Hyperlipidemia Brother   . Hypertension Brother   . Stroke Brother   . Hypertension Mother   . Hyperlipidemia Mother   . Diabetes Mother   . Cancer Father     prostate  . GER disease Father   . Stroke Father   . Hypertension Sister   . Hyperlipidemia Sister   . Hypertension Maternal Grandmother   . Stroke Maternal Grandmother   . Diabetes Maternal Grandfather   . Hypertension Maternal Grandfather    Social History  Substance Use Topics  . Smoking status: Former Smoker    Quit date: 11/25/1997  . Smokeless tobacco: None  . Alcohol Use: No    Review of Systems   Constitutional: Negative for fever and chills.  HENT: Negative for sore throat.   Eyes: Negative for visual disturbance.  Respiratory: Positive for shortness of breath. Negative for cough and wheezing.   Cardiovascular: Positive for chest pain, palpitations and leg swelling (chronic ).  Gastrointestinal: Negative for nausea, vomiting and abdominal pain.  Genitourinary: Negative for dysuria.  Musculoskeletal: Negative for back pain and neck pain.  Skin: Negative for rash.  Neurological: Negative for dizziness, syncope, weakness, light-headedness and headaches.      Allergies  Review of patient's allergies indicates no known allergies.  Home Medications   Prior to Admission medications   Medication Sig Start Date End Date Taking? Authorizing Provider  allopurinol (ZYLOPRIM) 300 MG tablet Take 300 mg by mouth daily.  05/02/15  Yes Historical Provider, MD  aspirin 81 MG tablet Take 81 mg by mouth daily.   Yes Historical Provider, MD  atorvastatin (LIPITOR) 40 MG tablet Take 40 mg by mouth daily at 6 PM.  11/02/13  Yes Historical Provider, MD  carvedilol (COREG) 25 MG tablet Take 1 tablet (25 mg total) by mouth 2 (two) times daily with a meal. 05/21/14  Yes Jarome Matin, MD  colchicine 0.6 MG tablet Take 0.6 mg by mouth daily.  05/02/15  Yes Historical Provider, MD  dabigatran (PRADAXA) 150 MG CAPS Take 1 capsule (150 mg total) by mouth every 12 (twelve) hours. 04/08/13  Yes Runell Gess, MD  Dapagliflozin Propanediol Marcelline Deist)  10 MG TABS Take 10 mg by mouth daily. 05/21/14  Yes Jarome Matin, MD  DIOVAN 160 MG tablet Take 160 mg by mouth 2 (two) times daily.  10/04/13  Yes Historical Provider, MD  doxazosin (CARDURA) 4 MG tablet Take 4 mg by mouth daily.  11/04/13  Yes Historical Provider, MD  furosemide (LASIX) 40 MG tablet Take 1 tablet (40 mg total) by mouth daily. 06/15/15  Yes Lennette Bihari, MD  HUMALOG MIX 75/25 KWIKPEN (75-25) 100 UNIT/ML Kwikpen Inject 30-60 Units into the skin  See admin instructions. Take 60 units in the morning, 30 units at lunch and 40 units in the evening 05/02/15  Yes Historical Provider, MD  Multiple Vitamin (MULTIVITAMIN WITH MINERALS) TABS tablet Take 1 tablet by mouth daily.   Yes Historical Provider, MD  omeprazole (PRILOSEC OTC) 20 MG tablet Take 1 tablet (20 mg total) by mouth daily. 11/09/14  Yes Runell Gess, MD  ONE TOUCH ULTRA TEST test strip  04/19/15   Historical Provider, MD   BP 154/86 mmHg  Pulse 74  Temp(Src) 98.2 F (36.8 C) (Oral)  Resp 25  Ht 5\' 5"  (1.651 m)  Wt 255 lb (115.667 kg)  BMI 42.43 kg/m2  SpO2 98% Physical Exam  Constitutional: He is oriented to person, place, and time. He appears well-developed and well-nourished. No distress.  Obese male. Nontoxic appearing.  HENT:  Head: Normocephalic and atraumatic.  Mouth/Throat: Oropharynx is clear and moist.  Eyes: Conjunctivae are normal. Pupils are equal, round, and reactive to light. Right eye exhibits no discharge. Left eye exhibits no discharge.  Neck: Neck supple. No JVD present.  Cardiovascular: Normal heart sounds and intact distal pulses.   Bilateral radial pulses are intact. Heart rate is 150. A. fib on the monitor.  Pulmonary/Chest: Effort normal and breath sounds normal. No respiratory distress. He has no wheezes. He has no rales.  Lungs are clear to auscultation bilaterally.  Abdominal: Soft. He exhibits no distension. There is no tenderness. There is no guarding.  Musculoskeletal: He exhibits edema.  Bilateral lower extremity edema.  Lymphadenopathy:    He has no cervical adenopathy.  Neurological: He is alert and oriented to person, place, and time. Coordination normal.  Patient is alert and oriented 3. Sensation is intact in his bilateral upper and lower extremities.  Skin: Skin is warm and dry. No rash noted. He is not diaphoretic. No erythema. No pallor.  Psychiatric: He has a normal mood and affect. His behavior is normal.  Nursing note and  vitals reviewed.   ED Course  Procedures (including critical care time) Labs Review Labs Reviewed  MAGNESIUM - Abnormal; Notable for the following:    Magnesium 1.6 (*)    All other components within normal limits  URINE CULTURE  CBC  BASIC METABOLIC PANEL  BRAIN NATRIURETIC PEPTIDE  URINALYSIS, ROUTINE W REFLEX MICROSCOPIC (NOT AT [facility])  Rosezena Sensor, ED    Imaging Review Dg Chest Port 1 View  07/08/2015   CLINICAL DATA:  Chest discomfort and shortness of breath. Left arm tingling.  EXAM: PORTABLE CHEST - 1 VIEW  COMPARISON:  06/25/2015  FINDINGS: The heart size and mediastinal contours are within normal limits. Both lungs are clear. The visualized skeletal structures are unremarkable.  IMPRESSION: No active disease.   Electronically Signed   By: Elige Ko   On: 07/08/2015 07:15      EKG Interpretation   Date/Time:  Saturday July 08 2015 07:47:54 EDT Ventricular Rate:  139 PR Interval:  119 QRS Duration: 105 QT Interval:  404 QTC Calculation: 614 R Axis:   27 Text Interpretation:  Sinus tachycardia Abnormal R-wave progression, early  transition Borderline ST elevation, lateral leads Prolonged QT interval  Confirmed by WARD,  DO, KRISTEN [phone]) on 07/08/2015 8:08:22 AM     Filed Vitals:   07/08/15 0756 07/08/15 0800 07/08/15 0845 07/08/15 0900  BP: 107/73 128/74 146/66 154/86  Pulse:  141 64 74  Temp:      TempSrc:      Resp:  25 22 25   Height:      Weight:      SpO2:  99% 100% 98%    MDM   Meds given in ED:  Medications  diltiazem (CARDIZEM) 1 mg/mL load via infusion 20 mg (20 mg Intravenous Given 07/08/15 0652)    And  diltiazem (CARDIZEM) 100 mg in dextrose 5 % 100 mL (1 mg/mL) infusion (5 mg/hr Intravenous New Bag/Given 07/08/15 0647)  magnesium sulfate IVPB 1 g 100 mL (1 g Intravenous New Bag/Given 07/08/15 0844)  aspirin chewable tablet 324 mg (324 mg Oral Given 07/08/15 0654)    New Prescriptions   No medications on file    Final diagnoses:   Atrial fibrillation with RVR   This is a 64 y.o. male with a history of paroxysmal a-fib on pradaxa and carvedilol, diabetes, HTN, and lower extremity edema who presents to the ED complaining of chest pain, shortness of breath and palpitations since 0430 this am. He also reports having some left arm tingling this am that has since resolved. His primary cardiologist is Dr. Allyson Sabal.  On exam the patient is afebrile nontoxic appearing. He is found to be in A. fib RVR with a rate of 170. He is hypertensive. He was started on Cardizem. His repeat EKG showed sinus tachycardia. His troponin is negative. CBC is within normal limits. BMP is unremarkable. His magnesium is slightly low at 1.6. Patient given 1 gram mag in the ED. BNP is unremarkable. Chest x-ray is unremarkable. Reevaluation the patient reports feeling better and that his chest tightness is resolving. Plan is for admission. The patient and family are in agreement with admission. I consulted with NP [place] who is working with Dr. Ollen Barges who accepted the patient for admission and requested step down bed. She also requested cardiology consult. I spoke with Dr. Donato Schultz from cardiology who reports he will be by to see the patient.   This patient was discussed with and evaluated by Dr. Elesa Massed who agrees with assessment and plan.  CRITICAL CARE Performed by: Lawana Chambers   Total critical care time: 35  Critical care time was exclusive of separately billable procedures and treating other patients.  Critical care was necessary to treat or prevent imminent or life-threatening deterioration.  Critical care was time spent personally by me on the following activities: development of treatment plan with patient and/or surrogate as well as nursing, discussions with consultants, evaluation of patient's response to treatment, examination of patient, obtaining history from patient or surrogate, ordering and performing treatments and interventions,  ordering and review of laboratory studies, ordering and review of radiographic studies, pulse oximetry and re-evaluation of patient's condition.     Everlene Farrier, PA-C 07/08/15 [phone]

## 2015-07-09 ENCOUNTER — Inpatient Hospital Stay (HOSPITAL_BASED_OUTPATIENT_CLINIC_OR_DEPARTMENT_OTHER): Payer: Medicare Other

## 2015-07-09 DIAGNOSIS — I5031 Acute diastolic (congestive) heart failure: Secondary | ICD-10-CM

## 2015-07-09 DIAGNOSIS — R079 Chest pain, unspecified: Secondary | ICD-10-CM

## 2015-07-09 DIAGNOSIS — R600 Localized edema: Secondary | ICD-10-CM

## 2015-07-09 DIAGNOSIS — N139 Obstructive and reflux uropathy, unspecified: Secondary | ICD-10-CM

## 2015-07-09 DIAGNOSIS — I4891 Unspecified atrial fibrillation: Secondary | ICD-10-CM

## 2015-07-09 LAB — GLUCOSE, CAPILLARY
GLUCOSE-CAPILLARY: 103 mg/dL — AB (ref 65–99)
Glucose-Capillary: 179 mg/dL — ABNORMAL HIGH (ref 65–99)

## 2015-07-09 LAB — TROPONIN I: Troponin I: 0.14 ng/mL — ABNORMAL HIGH (ref <0.031)

## 2015-07-09 MED ORDER — PERFLUTREN LIPID MICROSPHERE
1.0000 mL | INTRAVENOUS | Status: AC | PRN
  Administered 2015-07-09: 2 mL via INTRAVENOUS
  Filled 2015-07-09: qty 10

## 2015-07-09 MED ORDER — DILTIAZEM HCL ER 120 MG PO CP24: 120.0000 mg | ORAL_CAPSULE | ORAL | Status: DC

## 2015-07-09 NOTE — Care Management Note (Signed)
Case Management Note  Patient Details  Name: Raymond Gordon MRN: [id number] Date of Birth: 03-May-1951  Subjective/Objective:                  Tachycardia  Action/Plan: CM spoke to patient and patient's mother at the bedside. Pt states that he is not sure when he will be discharged but that he has no equipment or HH needs that he can think of. Pt states that he has a walker if needed, BSC, shower chair, and CPAP machine at home. Pt denies difficulty obtaining medications. Pt will be discharged home where he lives with his mother. No further CM needs communicated. PT/OT eval still pending and CM will monitor for any additional CM or discharge planning needs.   Expected Discharge Date:  07/09/15               Expected Discharge Plan:  Home/Self Care  In-House Referral:     Discharge planning Services  CM Consult  Post Acute Care Choice:    Choice offered to:     DME Arranged:    DME Agency:     HH Arranged:    HH Agency:     Status of Service:  In process, will continue to follow  Medicare Important Message Given:    Date Medicare IM Given:    Medicare IM give by:    Date Additional Medicare IM Given:    Additional Medicare Important Message give by:     If discussed at Long Length of Stay Meetings, dates discussed:    Additional Comments:  Darcel Smalling, RN 07/09/2015, 11:01 AM

## 2015-07-09 NOTE — Discharge Summary (Addendum)
Physician Discharge Summary   Patient ID: Raymond Gordon MRN: [id number] DOB/AGE: June 05, 1951 64 y.o.  Admit date: 07/08/2015 Discharge date: 07/09/2015  Primary Care Physician:  Gaspar Garbe, MD  Discharge Diagnoses:    . Atrial fib with RVR . Severe OSA (obstructive sleep apnea) . Morbid obesity . Bilateral lower extremity edema . Acute urinary obstruction . Prolonged Q-T interval on ECG  Consults: Cardiology, Dr. Anne Fu   Recommendations for Outpatient Follow-up:  Please note patient will continue beta blocker, also added diltiazem 120 mg daily  Patient recommended diet and weight control  Per patient, he has a urology appointment scheduled with Dr. Brunilda Payor  TESTS THAT NEED FOLLOW-UP CBC, BMET   DIET: Carbmodified diet    Allergies:  No Known Allergies   Discharge Medications:   Medication List    TAKE these medications        allopurinol 300 MG tablet  Commonly known as:  ZYLOPRIM  Take 300 mg by mouth daily.     aspirin 81 MG tablet  Take 81 mg by mouth daily.     atorvastatin 40 MG tablet  Commonly known as:  LIPITOR  Take 40 mg by mouth daily at 6 PM.     carvedilol 25 MG tablet  Commonly known as:  COREG  Take 1 tablet (25 mg total) by mouth 2 (two) times daily with a meal.     colchicine 0.6 MG tablet  Take 0.6 mg by mouth daily.     dabigatran 150 MG Caps capsule  Commonly known as:  PRADAXA  Take 1 capsule (150 mg total) by mouth every 12 (twelve) hours.     dapagliflozin propanediol 10 MG Tabs tablet  Commonly known as:  FARXIGA  Take 10 mg by mouth daily.     diltiazem 120 MG 24 hr capsule  Commonly known as:  DILACOR XR  Take 1 capsule (120 mg total) by mouth daily.     DIOVAN 160 MG tablet  Generic drug:  valsartan  Take 160 mg by mouth 2 (two) times daily.     doxazosin 4 MG tablet  Commonly known as:  CARDURA  Take 4 mg by mouth daily.     furosemide 40 MG tablet  Commonly known as:  LASIX  Take 1 tablet (40 mg  total) by mouth daily.     HUMALOG MIX 75/25 KWIKPEN (75-25) 100 UNIT/ML Kwikpen  Generic drug:  Insulin Lispro Prot & Lispro  Inject 30-60 Units into the skin See admin instructions. Take 60 units in the morning, 30 units at lunch and 40 units in the evening     multivitamin with minerals Tabs tablet  Take 1 tablet by mouth daily.     omeprazole 20 MG tablet  Commonly known as:  PRILOSEC OTC  Take 1 tablet (20 mg total) by mouth daily.     ONE TOUCH ULTRA TEST test strip  Generic drug:  glucose blood         Brief H and P: For complete details please refer to admission H and P, but in briefBobby Gordon is a 64 y.o. male, with diabetes mellitus, paroxysmal atrial fibrillation, severe obstructive sleep apnea, and recent diagnosis of acute urinary retention with possible bacterial prostatitis on 06/26/2015. Mr. Greathouse presented to the ER with atrial fibrillation and RVR. He reported waking at 4:30 this morning with palpitations, chest tightness and associated shortness of breath, and left arm numbness and tingling. The chest tightness was centralized and has eased with treatment  here in the ER. It is similar to the chest tightness that he has had intermittently over the last 4 years. He reported mildly increased dyspnea on exertion recently. He stated he is wearing his C Pap at night and is compliant with his medications including Pradaxa. 2 weeks ago he presented to the emergency department with acute urinary obstruction. A Foley catheter was put in place and he was treated with Bactrim for 1 week. He is scheduled to see Dr. Brunilda Payor of [facility] on August 24. In the emergency department his heart rate ranges between 39 and 170. Respirations have been elevated as well. Fortunately serologies are within normal limits-including BNP and point-of-care troponin. Urinary analysis is pending.   Hospital Course:   A. fib with RVR and associated chest tightness Patient with history of same.  Uncertain what caused this acute exacerbation-possibly recent urinary tract infection versus ongoing severe objective sleep apnea. BMP is within normal limits. Patient was admitted to stepdown. He was placed on Cardizem drip. Serial troponins were slightly elevated, 0.14 and trended down, BNP 74.8. Patient converted after the admission to normal sinus rhythm hence Cardizem drip was discontinued. He was placed on oral Cardizem by cardiology. Patient will continue pradaxa.  2-D echo showed EF 65-70% and normal wall motion.  Severe obstructive sleep apnea C Pap daily at bedtime  Diabetes mellitus On Humalog, NPA and Farxiga at home. Takes 120 units of insulin daily spread out over 3 meals.  Dr. Wylene Simmer manages his diabetes  Acute urinary obstruction Diagnosed in ER on 8/1. Received 7 days of Bactrim. Has ongoing Foley in place. UA showed no UTI. Patient is scheduled to see Dr. Brunilda Payor on 8/24.  Bilateral lower extremity edema. Chronic. Per patient appears improved. Continue home dose of Lasix. Follow with cardiology outpatient   Morbid obesity.  Day of Discharge BP 144/52 mmHg  Pulse 62  Temp(Src) 98.7 F (37.1 C) (Oral)  Resp 14  Ht 5\' 5"  (1.651 m)  Wt 119.75 kg (264 lb)  BMI 43.93 kg/m2  SpO2 100%  Physical Exam: General: Alert and awake oriented x3 not in any acute distress. HEENT: anicteric sclera, pupils reactive to light and accommodation CVS: S1-S2 clear no murmur rubs or gallops Chest: clear to auscultation bilaterally, no wheezing rales or rhonchi Abdomen: soft nontender, nondistended, normal bowel sounds Extremities: no cyanosis, clubbing or edema noted bilaterally Neuro: Cranial nerves II-XII intact, no focal neurological deficits   The results of significant diagnostics from this hospitalization (including imaging, microbiology, ancillary and laboratory) are listed below for reference.    LAB RESULTS: Basic Metabolic Panel:  Recent Labs Lab 07/08/15 0634  NA  139  K 4.0  CL 105  CO2 25  GLUCOSE 98  BUN 10  CREATININE 0.89  CALCIUM 9.7  MG 1.6*   Liver Function Tests: No results for input(s): AST, ALT, ALKPHOS, BILITOT, PROT, ALBUMIN in the last 168 hours. No results for input(s): LIPASE, AMYLASE in the last 168 hours. No results for input(s): AMMONIA in the last 168 hours. CBC:  Recent Labs Lab 07/08/15 0634  WBC 6.8  HGB 14.7  HCT 44.8  MCV 87.7  PLT 323   Cardiac Enzymes:  Recent Labs Lab 07/08/15 1845 07/08/15 2350  TROPONINI 0.15* 0.14*   BNP: Invalid input(s): POCBNP CBG:  Recent Labs Lab 07/08/15 2102 07/09/15 0725  GLUCAP 174* 103*    Significant Diagnostic Studies:  Dg Chest Port 1 View  07/08/2015   CLINICAL DATA:  Chest discomfort and shortness  of breath. Left arm tingling.  EXAM: PORTABLE CHEST - 1 VIEW  COMPARISON:  06/25/2015  FINDINGS: The heart size and mediastinal contours are within normal limits. Both lungs are clear. The visualized skeletal structures are unremarkable.  IMPRESSION: No active disease.   Electronically Signed   By: Elige Ko   On: 07/08/2015 07:15    2D ECHO: Study Conclusions  - Left ventricle: The cavity size was normal. Wall thickness was increased in a pattern of mild LVH. Systolic function was vigorous. The estimated ejection fraction was in the range of 65% to 70%. Wall motion was normal; there were no regional wall motion abnormalities. Features are consistent with a pseudonormal left ventricular filling pattern, with concomitant abnormal relaxation and increased filling pressure (grade 2 diastolic dysfunction). - Left atrium: The atrium was mildly to moderately dilated.  Disposition and Follow-up:    DISPOSITION: Home   DISCHARGE FOLLOW-UP     Follow-up Information    Follow up with Gaspar Garbe, MD. Schedule an appointment as soon as possible for a visit in 10 days.   Specialty:  Internal Medicine   Why:  for hospital follow-up    Contact information:   [address] [name] [address] [phone]       Follow up with Nanetta Batty, MD. Schedule an appointment as soon as possible for a visit in 2 weeks.   Specialties:  Cardiology, Radiology   Why:  for hospital follow-up   Contact information:   [address] [name] [address] [phone]        Time spent on Discharge: 35 mins   Signed:   Odis Turck M.D. [facility] 07/09/2015, 10:26 AM Pager: [phone]

## 2015-07-09 NOTE — Progress Notes (Signed)
Subjective:  Currently comfortable, sinus rhythm, no chest pain, no shortness of breath  Objective:  Vital Signs in the last 24 hours: Temp:  [98 F (36.7 C)-98.7 F (37.1 C)] 98.7 F (37.1 C) (08/14 0500) Pulse Rate:  [60-144] 62 (08/14 0500) Resp:  [12-32] 14 (08/14 0500) BP: (115-165)/(50-105) 144/52 mmHg (08/14 0500) SpO2:  [95 %-100 %] 100 % (08/14 0500) Weight:  [262 lb 6.4 oz (119.024 kg)-264 lb (119.75 kg)] 264 lb (119.75 kg) (08/14 0500)  Intake/Output from previous day: 08/13 0701 - 08/14 0700 In: -  Out: 1650 [Urine:1650]   Physical Exam: General: Well developed, well nourished, in no acute distress. Head:  Normocephalic and atraumatic. Lungs: Clear to auscultation and percussion. Heart: Normal S1 and S2.  No murmur, rubs or gallops.  Abdomen: soft, non-tender, positive bowel sounds. Overweight Extremities: No clubbing or cyanosis. No edema. Neurologic: Alert and oriented x 3.    Lab Results:  Recent Labs  07/08/15 0634  WBC 6.8  HGB 14.7  PLT 323    Recent Labs  07/08/15 0634  NA 139  K 4.0  CL 105  CO2 25  GLUCOSE 98  BUN 10  CREATININE 0.89    Recent Labs  07/08/15 1845 07/08/15 2350  TROPONINI 0.15* 0.14*   Hepatic Function Panel No results for input(s): PROT, ALBUMIN, AST, ALT, ALKPHOS, BILITOT, BILIDIR, IBILI in the last 72 hours. No results for input(s): CHOL in the last 72 hours. No results for input(s): PROTIME in the last 72 hours.  Imaging: Dg Chest Port 1 View  07/08/2015   CLINICAL DATA:  Chest discomfort and shortness of breath. Left arm tingling.  EXAM: PORTABLE CHEST - 1 VIEW  COMPARISON:  06/25/2015  FINDINGS: The heart size and mediastinal contours are within normal limits. Both lungs are clear. The visualized skeletal structures are unremarkable.  IMPRESSION: No active disease.   Electronically Signed   By: Elige Ko   On: 07/08/2015 07:15   Personally viewed.   Telemetry: Normal sinus rhythm Personally  viewed.   EKG:  Currently normal sinus rhythm, previous A. fib with rapid ventricular response/atrial flutter with 2-1 conduction on repeat EKG  Cardiac Studies:  Prior cardiac catheterization 07/18/11, Dr. Rennis Golden, mild luminal irregularities, small vessel disease and normal LV function.  Scheduled Meds: . allopurinol  300 mg Oral Daily  . aspirin  81 mg Oral Daily  . atorvastatin  40 mg Oral q1800  . carvedilol  25 mg Oral BID WC  . colchicine  0.6 mg Oral Daily  . dabigatran  150 mg Oral Q12H  . diltiazem  120 mg Oral Q24H  . doxazosin  4 mg Oral Daily  . insulin aspart  0-20 Units Subcutaneous TID WC  . insulin aspart  10 Units Subcutaneous TID WC  . insulin glargine  25 Units Subcutaneous BID  . irbesartan  150 mg Oral Daily  . multivitamin with minerals  1 tablet Oral Daily  . pantoprazole  20 mg Oral Daily   Continuous Infusions:  PRN Meds:.acetaminophen, ondansetron (ZOFRAN) IV  Assessment/Plan:  Principal Problem:   Atrial fibrillation with RVR Active Problems:   Morbid obesity   Diabetes   CHF (congestive heart failure), NYHA class I   Severe OSA (obstructive sleep apnea)   Bilateral lower extremity edema   Acute urinary obstruction   Prolonged Q-T interval on ECG  1. Paroxysmal atrial fibrillation  - In addition to his carvedilol 25 mg twice a day, we will add diltiazem  CD 120 mg once a day. Converted in emergency room with IV diltiazem addition.  - Most recent EKG shows sinus rhythm with PVC, no ST segment changes.  2. Chronic anticoagulation  - Continue with Pradaxa  3. Morbid obesity  - Recommend weight loss, will help overall with atrial fibrillation as well  4. Mildly elevated troponin  - Flat, demand ischemia in the setting of A. fib with rapid ventricular response  - Similar presentation 2012-catheterization with small vessel disease, mild/minimal luminal irregularities, normal LV function.  - If echocardiogram does not give done this morning, I'm  comfortable with this being ordered as an outpatient for further evaluation.  5. Urinary retention  - Foley catheter has been in place for approximately 2 weeks.  I'm comfortable with discharge today. Addition of diltiazem as above I would be fine with outpatient echocardiogram ordered We'll have follow-up with Dr. Allyson Sabal.  Raymond Gordon 07/09/2015, 8:22 AM

## 2015-07-09 NOTE — Progress Notes (Signed)
Echocardiogram 2D Echocardiogram has been performed.  Raymond Gordon 07/09/2015, 10:26 AM

## 2015-07-09 NOTE — Evaluation (Addendum)
Physical Therapy Evaluation Patient Details Name: Raymond Gordon MRN: [id number] DOB: 1951/10/03 Today's Date: 07/09/2015   History of Present Illness  Patient is a 64 yo male admitted 07/08/15 with tachycardia and SOB.  Patient with Afib with RVR.  PMH:  DM, PAF, OSA, HTN, CHF, BLE edema  Clinical Impression  Patient is functioning close to his baseline level.  Encouraged patient to use RW at discharge for ambulation for safety/balance.  No f/u PT needs identified.    Follow Up Recommendations No PT follow up;Supervision/Assistance - 24 hour    Equipment Recommendations  None recommended by PT    Recommendations for Other Services       Precautions / Restrictions Precautions Precautions: Fall Restrictions Weight Bearing Restrictions: No      Mobility  Bed Mobility                  Transfers Overall transfer level: Needs assistance Equipment used: 1 person hand held assist Transfers: Sit to/from Stand Sit to Stand: Min guard         General transfer comment: Patient uses correct hand placement.  Assist for safety during transition.  Hand hold assist in stance for balance.  Ambulation/Gait Ambulation/Gait assistance: Min assist Ambulation Distance (Feet): 32 Feet Assistive device: 1 person hand held assist Gait Pattern/deviations: Step-through pattern;Decreased stride length;Shuffle;Wide base of support Gait velocity: Decreased Gait velocity interpretation: Below normal speed for age/gender General Gait Details: Patient with slow gait speed, slightly unsteady.  Improved balance with hand hold assist.  Educated patient (and mother) on use of RW for ambulation at home for safety.  Verbalized understanding.  Stairs            Wheelchair Mobility    Modified Rankin (Stroke Patients Only)       Balance Overall balance assessment: Needs assistance         Standing balance support: No upper extremity supported Standing balance-Leahy Scale:  Fair Standing balance comment: Good static standing balance.  Needs UE support for dynamic standing activities/gait.                             Pertinent Vitals/Pain Pain Assessment: No/denies pain    Home Living Family/patient expects to be discharged to:: Private residence Living Arrangements: Parent (Mother) Available Help at Discharge: Family;Available 24 hours/day (Mother and sister) Type of Home: House Home Access: Stairs to enter Entrance Stairs-Rails: Doctor, general practice of Steps: 4 Home Layout: One level Home Equipment: Environmental consultant - 2 wheels;Cane - single point;Bedside commode;Shower seat;Tub bench      Prior Function Level of Independence: Independent;Needs assistance      ADL's / Homemaking Assistance Needed: Assist for meal prep.  Comments: Does not drive.     Hand Dominance        Extremity/Trunk Assessment   Upper Extremity Assessment: Overall WFL for tasks assessed           Lower Extremity Assessment: Generalized weakness         Communication   Communication: No difficulties  Cognition Arousal/Alertness: Awake/alert Behavior During Therapy: WFL for tasks assessed/performed;Anxious Overall Cognitive Status: Within Functional Limits for tasks assessed                      General Comments      Exercises        Assessment/Plan    PT Assessment Patent does not need any further PT services  PT  Diagnosis Abnormality of gait;Generalized weakness   PT Problem List    PT Treatment Interventions     PT Goals (Current goals can be found in the Care Plan section) Acute Rehab PT Goals PT Goal Formulation: All assessment and education complete, DC therapy    Frequency     Barriers to discharge        Co-evaluation               End of Session Equipment Utilized During Treatment: Gait belt Activity Tolerance: Patient limited by fatigue Patient left: in chair;with call bell/phone within reach;with  family/visitor present Nurse Communication: Mobility status (No f/u PT needs)         Time: 4098-1191 PT Time Calculation (min) (ACUTE ONLY): 13 min   Charges:   PT Evaluation $Initial PT Evaluation Tier I: 1 Procedure     PT G CodesVena Austria 2015-07-28, 12:48 PM Durenda Hurt. Renaldo Fiddler, [facility] Acute Rehab Services Pager [phone]

## 2015-07-10 ENCOUNTER — Telehealth: Payer: Self-pay | Admitting: Cardiovascular Disease

## 2015-07-10 LAB — HEMOGLOBIN A1C
HEMOGLOBIN A1C: 8 % — AB (ref 4.8–5.6)
MEAN PLASMA GLUCOSE: 183 mg/dL

## 2015-07-10 LAB — URINE CULTURE

## 2015-07-10 MED ORDER — CARVEDILOL 25 MG PO TABS
25.0000 mg | ORAL_TABLET | Freq: Two times a day (BID) | ORAL | Status: DC
Start: 2015-07-10 — End: 2016-02-28

## 2015-07-10 NOTE — Telephone Encounter (Signed)
TCM call to patient.Spoke to sister who helps patient, she stated pt is doing well.Stated he is taking medications as prescribed.Stated he needed refill on coreg.Refill sent to pharmacy.Advised to keep post hospital appointment with Dr.Berry 07/26/15 at 11:00 am.

## 2015-07-10 NOTE — Telephone Encounter (Signed)
Has questions about hospital discharge medications.

## 2015-07-11 DIAGNOSIS — E161 Other hypoglycemia: Secondary | ICD-10-CM | POA: Diagnosis not present

## 2015-07-14 ENCOUNTER — Encounter: Payer: Self-pay | Admitting: Cardiovascular Disease

## 2015-07-19 DIAGNOSIS — N401 Enlarged prostate with lower urinary tract symptoms: Secondary | ICD-10-CM | POA: Diagnosis not present

## 2015-07-19 DIAGNOSIS — R339 Retention of urine, unspecified: Secondary | ICD-10-CM | POA: Diagnosis not present

## 2015-07-19 DIAGNOSIS — N138 Other obstructive and reflux uropathy: Secondary | ICD-10-CM | POA: Diagnosis not present

## 2015-07-26 ENCOUNTER — Encounter: Payer: Self-pay | Admitting: Cardiovascular Disease

## 2015-07-26 ENCOUNTER — Ambulatory Visit (INDEPENDENT_AMBULATORY_CARE_PROVIDER_SITE_OTHER): Payer: Medicare Other | Admitting: Cardiovascular Disease

## 2015-07-26 VITALS — BP 126/52 | HR 65 | Ht 65.0 in | Wt 280.0 lb

## 2015-07-26 DIAGNOSIS — I480 Paroxysmal atrial fibrillation: Secondary | ICD-10-CM | POA: Diagnosis not present

## 2015-07-26 DIAGNOSIS — I10 Essential (primary) hypertension: Secondary | ICD-10-CM | POA: Diagnosis not present

## 2015-07-26 DIAGNOSIS — E785 Hyperlipidemia, unspecified: Secondary | ICD-10-CM | POA: Diagnosis not present

## 2015-07-26 DIAGNOSIS — R338 Other retention of urine: Secondary | ICD-10-CM | POA: Diagnosis not present

## 2015-07-26 DIAGNOSIS — G4733 Obstructive sleep apnea (adult) (pediatric): Secondary | ICD-10-CM | POA: Diagnosis not present

## 2015-07-26 NOTE — Assessment & Plan Note (Addendum)
History of obstructive sleep apnea on C Pap 

## 2015-07-26 NOTE — Progress Notes (Signed)
07/26/2015 Raymond Gordon   [birth year]  [id number]  Primary Physician Gaspar Garbe, MD Primary Cardiologist: Runell Gess MD [name]   HPI:  The patient is a very pleasant, 64 year old severely overweight, single African American male with no children who I last saw in the office 12/09/14. 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 His risk factors including noninsulin-requiring diabetes, hypertension, hyperlipidemia and remote tobacco abuse having quit smoking back in 1999 and had smoked 1 pack per day. His father did have an MI and had bypass surgery. He was admitted to [facility] July 17, 2011, with chest pain and A-fib. He was catheterized by Dr. [place] Hilty on July 18, 2011, revealing minimal luminal irregularities with small vessel disease and normal LV function. He ultimately spontaneously converted to sinus rhythm and was placed on Pradaxa because of a CHADS2 score of 2. He has had no recurrent symptoms. He denies chest pain or shortness of breath.Dr. Wylene Simmer follows his lipid profile. He does give symptoms compatible with obstructive sleep apnea and currently wears CPAP with benefit. He was admitted to [facility] 07/08/15 with A. Fib with RVR and chest pain. His enzymes were negative. He is placed in IV due to diltiazem and converted spontaneously to sinus rhythm. His amlodipine was changed to by mouth diltiazem. He has had no recurrent symptoms.   Current Outpatient Prescriptions  Medication Sig Dispense Refill  . allopurinol (ZYLOPRIM) 300 MG tablet Take 300 mg by mouth daily.     Marland Kitchen aspirin 81 MG tablet Take 81 mg by mouth daily.    Marland Kitchen atorvastatin (LIPITOR) 40 MG tablet Take 40 mg by mouth daily at 6 PM.     . carvedilol (COREG) 25 MG tablet Take 1 tablet (25 mg total) by mouth 2 (two) times daily with a meal. 60 tablet 6  . colchicine 0.6 MG tablet Take 0.6 mg by mouth daily.     . dabigatran (PRADAXA) 150  MG CAPS Take 1 capsule (150 mg total) by mouth every 12 (twelve) hours. 60 capsule 6  . Dapagliflozin Propanediol (FARXIGA) 10 MG TABS Take 10 mg by mouth daily. 30 tablet 12  . diltiazem (DILACOR XR) 120 MG 24 hr capsule Take 1 capsule (120 mg total) by mouth daily. 30 capsule 3  . DIOVAN 160 MG tablet Take 160 mg by mouth 2 (two) times daily.     Marland Kitchen doxazosin (CARDURA) 4 MG tablet Take 4 mg by mouth daily.     . furosemide (LASIX) 40 MG tablet Take 1 tablet (40 mg total) by mouth daily. 30 tablet 6  . HUMALOG MIX 75/25 KWIKPEN (75-25) 100 UNIT/ML Kwikpen Inject 30-60 Units into the skin See admin instructions. Take 60 units in the morning, 30 units at lunch and 40 units in the evening    . Multiple Vitamin (MULTIVITAMIN WITH MINERALS) TABS tablet Take 1 tablet by mouth daily.    Marland Kitchen omeprazole (PRILOSEC OTC) 20 MG tablet Take 1 tablet (20 mg total) by mouth daily.    . ONE TOUCH ULTRA TEST test strip      No current facility-administered medications for this visit.    No Known Allergies  Social History   Social History  . Marital Status: Single    Spouse Name: N/A  . Number of Children: N/A  . Years of Education: N/A   Occupational History  . Not on file.   Social History  Main Topics  . Smoking status: Former Smoker    Quit date: 11/25/1997  . Smokeless tobacco: Not on file  . Alcohol Use: No  . Drug Use: No  . Sexual Activity: Not on file   Other Topics Concern  . Not on file   Social History Narrative     Review of Systems: General: negative for chills, fever, night sweats or weight changes.  Cardiovascular: negative for chest pain, dyspnea on exertion, edema, orthopnea, palpitations, paroxysmal nocturnal dyspnea or shortness of breath Dermatological: negative for rash Respiratory: negative for cough or wheezing Urologic: negative for hematuria Abdominal: negative for nausea, vomiting, diarrhea, bright red blood per rectum, melena, or hematemesis Neurologic: negative  for visual changes, syncope, or dizziness All other systems reviewed and are otherwise negative except as noted above.    Blood pressure 126/52, pulse 65, height  (1.651 m), weight 280 lb (127.007 kg).  General appearance: alert and no distress Neck: no adenopathy, no carotid bruit, no JVD, supple, symmetrical, trachea midline and thyroid not enlarged, symmetric, no tenderness/mass/nodules Lungs: clear to auscultation bilaterally Heart: regular rate and rhythm, S1, S2 normal, no murmur, click, rub or gallop Extremities: extremities normal, atraumatic, no cyanosis or edema  EKG not performed today  ASSESSMENT AND PLAN:   Severe OSA (obstructive sleep apnea) History of obstructive sleep apnea on C Pap  Paroxysmal atrial fibrillation History of paroxysmal atrial fibrillation on Pradaxa oral anticoagulation. He was recently admitted to ask ago with A. Fib with RVR and converted spontaneously on IV diltiazem which was converted to an oral agent.  Hyperlipidemia History of hyperlipidemia on atorvastatin 40 mg a day followed by his PCP  Essential hypertension History of hypertension blood pressure measured set 126/52. He is on carvedilol, diltiazem, and Diovan. Continue current meds at current dosing      Runell Gess MD [facility], [facility] 07/26/2015 11:41 AM

## 2015-07-26 NOTE — Assessment & Plan Note (Signed)
History of hyperlipidemia on atorvastatin 40 mg a day followed by his PCP 

## 2015-07-26 NOTE — Assessment & Plan Note (Signed)
History of paroxysmal atrial fibrillation on Pradaxa oral anticoagulation. He was recently admitted to ask ago with A. Fib with RVR and converted spontaneously on IV diltiazem which was converted to an oral agent.

## 2015-07-26 NOTE — Patient Instructions (Signed)
Your physician wants you to follow-up in: 1 year with Dr Berry. You will receive a reminder letter in the mail two months in advance. If you don't receive a letter, please call our office to schedule the follow-up appointment.  

## 2015-07-26 NOTE — Assessment & Plan Note (Signed)
History of hypertension blood pressure measured set 126/52. He is on carvedilol, diltiazem, and Diovan. Continue current meds at current dosing

## 2015-07-27 DIAGNOSIS — Z6841 Body Mass Index (BMI) 40.0 and over, adult: Secondary | ICD-10-CM | POA: Diagnosis not present

## 2015-07-27 DIAGNOSIS — R339 Retention of urine, unspecified: Secondary | ICD-10-CM | POA: Diagnosis not present

## 2015-07-27 DIAGNOSIS — M109 Gout, unspecified: Secondary | ICD-10-CM | POA: Diagnosis not present

## 2015-07-27 DIAGNOSIS — N419 Inflammatory disease of prostate, unspecified: Secondary | ICD-10-CM | POA: Diagnosis not present

## 2015-07-27 DIAGNOSIS — E785 Hyperlipidemia, unspecified: Secondary | ICD-10-CM | POA: Diagnosis not present

## 2015-07-27 DIAGNOSIS — Z23 Encounter for immunization: Secondary | ICD-10-CM | POA: Diagnosis not present

## 2015-07-27 DIAGNOSIS — G4733 Obstructive sleep apnea (adult) (pediatric): Secondary | ICD-10-CM | POA: Diagnosis not present

## 2015-07-27 DIAGNOSIS — E1129 Type 2 diabetes mellitus with other diabetic kidney complication: Secondary | ICD-10-CM | POA: Diagnosis not present

## 2015-07-27 DIAGNOSIS — I10 Essential (primary) hypertension: Secondary | ICD-10-CM | POA: Diagnosis not present

## 2015-07-27 DIAGNOSIS — I480 Paroxysmal atrial fibrillation: Secondary | ICD-10-CM | POA: Diagnosis not present

## 2015-08-04 DIAGNOSIS — R339 Retention of urine, unspecified: Secondary | ICD-10-CM | POA: Diagnosis not present

## 2015-08-04 DIAGNOSIS — N401 Enlarged prostate with lower urinary tract symptoms: Secondary | ICD-10-CM | POA: Diagnosis not present

## 2015-08-09 DIAGNOSIS — R809 Proteinuria, unspecified: Secondary | ICD-10-CM | POA: Diagnosis not present

## 2015-08-09 DIAGNOSIS — I10 Essential (primary) hypertension: Secondary | ICD-10-CM | POA: Diagnosis not present

## 2015-08-09 DIAGNOSIS — E1129 Type 2 diabetes mellitus with other diabetic kidney complication: Secondary | ICD-10-CM | POA: Diagnosis not present

## 2015-08-09 DIAGNOSIS — Z6841 Body Mass Index (BMI) 40.0 and over, adult: Secondary | ICD-10-CM | POA: Diagnosis not present

## 2015-08-10 ENCOUNTER — Ambulatory Visit: Payer: 59 | Admitting: Podiatry

## 2015-08-13 ENCOUNTER — Emergency Department (HOSPITAL_COMMUNITY): Payer: Medicare Other

## 2015-08-13 ENCOUNTER — Encounter (HOSPITAL_COMMUNITY): Payer: Self-pay | Admitting: Emergency Medicine

## 2015-08-13 ENCOUNTER — Inpatient Hospital Stay (HOSPITAL_COMMUNITY)
Admission: EM | Admit: 2015-08-13 | Discharge: 2015-08-22 | DRG: 291 | Disposition: A | Discharge status: Skilled Nursing Facility | Payer: Medicare Other | Attending: Internal Medicine | Admitting: Internal Medicine

## 2015-08-13 DIAGNOSIS — N139 Obstructive and reflux uropathy, unspecified: Secondary | ICD-10-CM

## 2015-08-13 DIAGNOSIS — I5031 Acute diastolic (congestive) heart failure: Secondary | ICD-10-CM | POA: Diagnosis present

## 2015-08-13 DIAGNOSIS — I509 Heart failure, unspecified: Secondary | ICD-10-CM

## 2015-08-13 DIAGNOSIS — IMO0002 Reserved for concepts with insufficient information to code with codable children: Secondary | ICD-10-CM | POA: Diagnosis present

## 2015-08-13 DIAGNOSIS — R339 Retention of urine, unspecified: Secondary | ICD-10-CM | POA: Diagnosis present

## 2015-08-13 DIAGNOSIS — I4891 Unspecified atrial fibrillation: Secondary | ICD-10-CM | POA: Diagnosis present

## 2015-08-13 DIAGNOSIS — E162 Hypoglycemia, unspecified: Secondary | ICD-10-CM

## 2015-08-13 DIAGNOSIS — K219 Gastro-esophageal reflux disease without esophagitis: Secondary | ICD-10-CM | POA: Diagnosis not present

## 2015-08-13 DIAGNOSIS — I5033 Acute on chronic diastolic (congestive) heart failure: Secondary | ICD-10-CM | POA: Diagnosis not present

## 2015-08-13 DIAGNOSIS — E662 Morbid (severe) obesity with alveolar hypoventilation: Secondary | ICD-10-CM | POA: Diagnosis present

## 2015-08-13 DIAGNOSIS — J9601 Acute respiratory failure with hypoxia: Secondary | ICD-10-CM | POA: Diagnosis present

## 2015-08-13 DIAGNOSIS — R55 Syncope and collapse: Secondary | ICD-10-CM

## 2015-08-13 DIAGNOSIS — I482 Chronic atrial fibrillation, unspecified: Secondary | ICD-10-CM

## 2015-08-13 DIAGNOSIS — Z8249 Family history of ischemic heart disease and other diseases of the circulatory system: Secondary | ICD-10-CM

## 2015-08-13 DIAGNOSIS — I959 Hypotension, unspecified: Secondary | ICD-10-CM | POA: Diagnosis present

## 2015-08-13 DIAGNOSIS — R9431 Abnormal electrocardiogram [ECG] [EKG]: Secondary | ICD-10-CM

## 2015-08-13 DIAGNOSIS — R0600 Dyspnea, unspecified: Secondary | ICD-10-CM

## 2015-08-13 DIAGNOSIS — E785 Hyperlipidemia, unspecified: Secondary | ICD-10-CM | POA: Diagnosis not present

## 2015-08-13 DIAGNOSIS — N390 Urinary tract infection, site not specified: Secondary | ICD-10-CM | POA: Diagnosis present

## 2015-08-13 DIAGNOSIS — Z7982 Long term (current) use of aspirin: Secondary | ICD-10-CM | POA: Diagnosis not present

## 2015-08-13 DIAGNOSIS — E1165 Type 2 diabetes mellitus with hyperglycemia: Secondary | ICD-10-CM | POA: Diagnosis present

## 2015-08-13 DIAGNOSIS — Z794 Long term (current) use of insulin: Secondary | ICD-10-CM

## 2015-08-13 DIAGNOSIS — I480 Paroxysmal atrial fibrillation: Secondary | ICD-10-CM | POA: Diagnosis present

## 2015-08-13 DIAGNOSIS — G4733 Obstructive sleep apnea (adult) (pediatric): Secondary | ICD-10-CM | POA: Diagnosis not present

## 2015-08-13 DIAGNOSIS — M1A9XX1 Chronic gout, unspecified, with tophus (tophi): Secondary | ICD-10-CM | POA: Diagnosis not present

## 2015-08-13 DIAGNOSIS — Z87891 Personal history of nicotine dependence: Secondary | ICD-10-CM

## 2015-08-13 DIAGNOSIS — Z7902 Long term (current) use of antithrombotics/antiplatelets: Secondary | ICD-10-CM | POA: Diagnosis not present

## 2015-08-13 DIAGNOSIS — R8271 Bacteriuria: Secondary | ICD-10-CM | POA: Clinically undetermined

## 2015-08-13 DIAGNOSIS — M6281 Muscle weakness (generalized): Secondary | ICD-10-CM | POA: Diagnosis not present

## 2015-08-13 DIAGNOSIS — J9621 Acute and chronic respiratory failure with hypoxia: Secondary | ICD-10-CM | POA: Diagnosis present

## 2015-08-13 DIAGNOSIS — Z6841 Body Mass Index (BMI) 40.0 and over, adult: Secondary | ICD-10-CM | POA: Diagnosis not present

## 2015-08-13 DIAGNOSIS — R069 Unspecified abnormalities of breathing: Secondary | ICD-10-CM | POA: Diagnosis not present

## 2015-08-13 DIAGNOSIS — I10 Essential (primary) hypertension: Secondary | ICD-10-CM | POA: Diagnosis not present

## 2015-08-13 DIAGNOSIS — R262 Difficulty in walking, not elsewhere classified: Secondary | ICD-10-CM | POA: Diagnosis not present

## 2015-08-13 DIAGNOSIS — Z79899 Other long term (current) drug therapy: Secondary | ICD-10-CM

## 2015-08-13 DIAGNOSIS — R600 Localized edema: Secondary | ICD-10-CM

## 2015-08-13 DIAGNOSIS — I110 Hypertensive heart disease with heart failure: Principal | ICD-10-CM | POA: Diagnosis present

## 2015-08-13 DIAGNOSIS — R0602 Shortness of breath: Secondary | ICD-10-CM | POA: Diagnosis not present

## 2015-08-13 DIAGNOSIS — N401 Enlarged prostate with lower urinary tract symptoms: Secondary | ICD-10-CM | POA: Diagnosis not present

## 2015-08-13 HISTORY — DX: Sleep apnea, unspecified: G47.30

## 2015-08-13 LAB — BRAIN NATRIURETIC PEPTIDE: B Natriuretic Peptide: 250.8 pg/mL — ABNORMAL HIGH (ref 0.0–100.0)

## 2015-08-13 LAB — CBC
HCT: 36.3 % — ABNORMAL LOW (ref 39.0–52.0)
HEMOGLOBIN: 11.8 g/dL — AB (ref 13.0–17.0)
MCH: 29.1 pg (ref 26.0–34.0)
MCHC: 32.5 g/dL (ref 30.0–36.0)
MCV: 89.6 fL (ref 78.0–100.0)
PLATELETS: 240 10*3/uL (ref 150–400)
RBC: 4.05 MIL/uL — ABNORMAL LOW (ref 4.22–5.81)
RDW: 14.5 % (ref 11.5–15.5)
WBC: 6.3 10*3/uL (ref 4.0–10.5)

## 2015-08-13 LAB — BASIC METABOLIC PANEL
Anion gap: 6 (ref 5–15)
BUN: 21 mg/dL — AB (ref 6–20)
CHLORIDE: 112 mmol/L — AB (ref 101–111)
CO2: 21 mmol/L — ABNORMAL LOW (ref 22–32)
CREATININE: 1.07 mg/dL (ref 0.61–1.24)
Calcium: 8.9 mg/dL (ref 8.9–10.3)
GFR calc Af Amer: >60 mL/min (ref >60)
GFR calc non Af Amer: >60 mL/min (ref >60)
Glucose, Bld: 247 mg/dL — ABNORMAL HIGH (ref 65–99)
Potassium: 4 mmol/L (ref 3.5–5.1)
SODIUM: 139 mmol/L (ref 135–145)

## 2015-08-13 LAB — I-STAT TROPONIN, ED
TROPONIN I, POC: 0 ng/mL (ref 0.00–0.08)
Troponin i, poc: 0.01 ng/mL (ref 0.00–0.08)

## 2015-08-13 MED ORDER — FUROSEMIDE 10 MG/ML IJ SOLN
40.0000 mg | Freq: Once | INTRAMUSCULAR | Status: AC
  Administered 2015-08-13: 40 mg via INTRAVENOUS
  Filled 2015-08-13: qty 4

## 2015-08-13 MED ORDER — SODIUM CHLORIDE 0.9 % IJ SOLN
3.0000 mL | Freq: Two times a day (BID) | INTRAMUSCULAR | Status: DC
  Administered 2015-08-14 – 2015-08-22 (×16): 3 mL via INTRAVENOUS

## 2015-08-13 MED ORDER — FUROSEMIDE 10 MG/ML IJ SOLN
40.0000 mg | Freq: Two times a day (BID) | INTRAMUSCULAR | Status: DC
  Administered 2015-08-14 – 2015-08-15 (×2): 40 mg via INTRAVENOUS
  Filled 2015-08-13 (×3): qty 4

## 2015-08-13 MED ORDER — NITROGLYCERIN 2 % TD OINT
1.0000 [in_us] | TOPICAL_OINTMENT | Freq: Once | TRANSDERMAL | Status: AC
  Administered 2015-08-13: 1 [in_us] via TOPICAL
  Filled 2015-08-13: qty 1

## 2015-08-13 MED ORDER — SODIUM CHLORIDE 0.9 % IV SOLN: 250.0000 mL | INTRAVENOUS | Status: DC | PRN

## 2015-08-13 MED ORDER — ACETAMINOPHEN 325 MG PO TABS
650.0000 mg | ORAL_TABLET | ORAL | Status: DC | PRN
  Administered 2015-08-18 (×2): 650 mg via ORAL
  Filled 2015-08-13 (×2): qty 2

## 2015-08-13 MED ORDER — ONDANSETRON HCL 4 MG/2ML IJ SOLN: 4.0000 mg | Freq: Four times a day (QID) | INTRAMUSCULAR | Status: DC | PRN

## 2015-08-13 MED ORDER — SODIUM CHLORIDE 0.9 % IJ SOLN: 3.0000 mL | INTRAMUSCULAR | Status: DC | PRN

## 2015-08-13 MED ORDER — POTASSIUM CHLORIDE CRYS ER 10 MEQ PO TBCR
10.0000 meq | EXTENDED_RELEASE_TABLET | Freq: Every day | ORAL | Status: AC
  Administered 2015-08-14 – 2015-08-15 (×2): 10 meq via ORAL
  Filled 2015-08-13 (×2): qty 1

## 2015-08-13 NOTE — ED Notes (Signed)
Spoke to  Dr. Lovell Sheehan, family requesting foley cathater placed. MD allows.

## 2015-08-13 NOTE — ED Provider Notes (Signed)
CSN: [id number]     Arrival date & time 08/13/15  1911 History   First MD Initiated Contact with Patient 08/13/15 1917     Chief Complaint  Patient presents with  . Shortness of Breath     (Consider location/radiation/quality/duration/timing/severity/associated sxs/prior Treatment) HPI Comments: 64 year old male with history of atrial fibrillation, obesity, heart failure, high blood pressure, lipids presents with worsening shortness of breath. Patient felt okay yesterday and had severe sugars of breath starting this afternoon. Patient unsure similar to heart failure history. Patient has had unspecified weight gain and mild worsening bilateral leg swelling. No oxygen at home. Patient takes Lasix unknown amount. Patient on Pradaxa. Patient does not know his medical history well. No recent surgery or blood clot history. Symptoms gradually worsening nothing is improved. Patient received nebulizer on route.  Patient is a 64 y.o. male presenting with shortness of breath. The history is provided by the patient.  Shortness of Breath Associated symptoms: cough   Associated symptoms: no abdominal pain, no chest pain, no fever, no headaches, no neck pain, no rash and no vomiting     Past Medical History  Diagnosis Date  . Paroxysmal atrial fibrillation   . Hypertension   . Hyperlipidemia   . Obesity   . Diabetes   . Dysrhythmia   . Obesity   . Bilateral lower extremity edema    Past Surgical History  Procedure Laterality Date  . Cardiac catheterization    . Cardiac catheterization N/A 2012    no large vessel occlusions   Family History  Problem Relation Age of Onset  . Heart failure Brother   . Hyperlipidemia Brother   . Hypertension Brother   . Stroke Brother   . Hypertension Mother   . Hyperlipidemia Mother   . Diabetes Mother   . Cancer Father     prostate  . GER disease Father   . Stroke Father   . Hypertension Sister   . Hyperlipidemia Sister   . Hypertension Maternal  Grandmother   . Stroke Maternal Grandmother   . Diabetes Maternal Grandfather   . Hypertension Maternal Grandfather    Social History  Substance Use Topics  . Smoking status: Former Smoker    Quit date: 11/25/1997  . Smokeless tobacco: None  . Alcohol Use: No    Review of Systems  Constitutional: Positive for fatigue. Negative for fever and chills.  HENT: Negative for congestion.   Eyes: Negative for visual disturbance.  Respiratory: Positive for cough and shortness of breath.   Cardiovascular: Negative for chest pain.  Gastrointestinal: Negative for vomiting and abdominal pain.  Genitourinary: Negative for dysuria and flank pain.  Musculoskeletal: Negative for back pain, neck pain and neck stiffness.  Skin: Negative for rash.  Neurological: Negative for light-headedness and headaches.      Allergies  Review of patient's allergies indicates no known allergies.  Home Medications   Prior to Admission medications   Medication Sig Start Date End Date Taking? Authorizing Provider  allopurinol (ZYLOPRIM) 300 MG tablet Take 300 mg by mouth daily.  05/02/15   Historical Provider, MD  aspirin 81 MG tablet Take 81 mg by mouth daily.    Historical Provider, MD  atorvastatin (LIPITOR) 40 MG tablet Take 40 mg by mouth daily at 6 PM.  11/02/13   Historical Provider, MD  carvedilol (COREG) 25 MG tablet Take 1 tablet (25 mg total) by mouth 2 (two) times daily with a meal. 07/10/15   Runell Gess, MD  colchicine 0.6  MG tablet Take 0.6 mg by mouth daily.  05/02/15   Historical Provider, MD  dabigatran (PRADAXA) 150 MG CAPS Take 1 capsule (150 mg total) by mouth every 12 (twelve) hours. 04/08/13   Runell Gess, MD  Dapagliflozin Propanediol (FARXIGA) 10 MG TABS Take 10 mg by mouth daily. 05/21/14   Jarome Matin, MD  diltiazem (DILACOR XR) 120 MG 24 hr capsule Take 1 capsule (120 mg total) by mouth daily. 07/09/15   Ripudeep Jenna Luo, MD  DIOVAN 160 MG tablet Take 160 mg by mouth 2 (two) times  daily.  10/04/13   Historical Provider, MD  doxazosin (CARDURA) 4 MG tablet Take 4 mg by mouth daily.  11/04/13   Historical Provider, MD  furosemide (LASIX) 40 MG tablet Take 1 tablet (40 mg total) by mouth daily. 06/15/15   Lennette Bihari, MD  HUMALOG MIX 75/25 KWIKPEN (75-25) 100 UNIT/ML Kwikpen Inject 30-60 Units into the skin See admin instructions. Take 60 units in the morning, 30 units at lunch and 40 units in the evening 05/02/15   Historical Provider, MD  Multiple Vitamin (MULTIVITAMIN WITH MINERALS) TABS tablet Take 1 tablet by mouth daily.    Historical Provider, MD  omeprazole (PRILOSEC OTC) 20 MG tablet Take 1 tablet (20 mg total) by mouth daily. 11/09/14   Runell Gess, MD  ONE TOUCH ULTRA TEST test strip  04/19/15   Historical Provider, MD   BP 145/85 mmHg  Pulse 54  Resp 24  SpO2 99% Physical Exam  Constitutional: He is oriented to person, place, and time. He appears well-developed and well-nourished.  HENT:  Head: Normocephalic and atraumatic.  Eyes: Right eye exhibits no discharge. Left eye exhibits no discharge.  Neck: Normal range of motion. Neck supple. No tracheal deviation present.  Cardiovascular: Regular rhythm.  Bradycardia present.   Pulmonary/Chest: He is in respiratory distress (crackles at bases difficult lung exam due to body habitus and respiratory difficulty). He has rales.  Abdominal: Soft. He exhibits no distension. There is no tenderness. There is no guarding.  Musculoskeletal: He exhibits edema (bilateral 3+ lower extremities).  Neurological: He is alert and oriented to person, place, and time. No cranial nerve deficit.  Skin: Skin is warm. Rash noted.  Psychiatric: He has a normal mood and affect.  Nursing note and vitals reviewed.   ED Course  Procedures (including critical care time) Emergency Ultrasound: Limited Thoracic Performed and interpreted by Dr Jodi Mourning Longitudinal view of anterior and base of left and right lung fields in real-time with  linear or phased array probe. Indication: dyspnea Findings: pos lung sliding pos B lines  Pos effusions Interpretation: pulm edema  Images electronically archived.   CPT Code: [phone] (limited transthoracic cardiac)    EMERGENCY DEPARTMENT [place] CARDIAC EXAM "Study: Limited Ultrasound of the heart and pericardium"  INDICATIONS:Dyspnea Multiple views of the heart and pericardium were obtained in real-time with a multi-frequency probe.  PERFORMED QI:ONGEXB  IMAGES ARCHIVED?: Yes  FINDINGS: No pericardial effusion and Normal contractility  LIMITATIONS:  Body habitus  VIEWS USED: Parasternal long axis, Parasternal short axis and Apical 4 chamber   INTERPRETATION: Cardiac activity present, Pericardial effusioin absent and Cardiac tamponade absent  CPT Code: 28413-24 (limited transthoracic cardiac)difficult exam.  CRITICAL CARE Performed by: Enid Skeens   Total critical care time: 35 min  Critical care time was exclusive of separately billable procedures and treating other patients.  Critical care was necessary to treat or prevent imminent or life-threatening deterioration.  Critical care  was time spent personally by me on the following activities: development of treatment plan with patient and/or surrogate as well as nursing, discussions with consultants, evaluation of patient's response to treatment, examination of patient, obtaining history from patient or surrogate, ordering and performing treatments and interventions, ordering and review of laboratory studies, ordering and review of radiographic studies, pulse oximetry and re-evaluation of patient's condition.   Labs Review Labs Reviewed  CBC - Abnormal; Notable for the following:    RBC 4.05 (*)    Hemoglobin 11.8 (*)    HCT 36.3 (*)    All other components within normal limits  BASIC METABOLIC PANEL - Abnormal; Notable for the following:    Chloride 112 (*)    CO2 21 (*)    Glucose, Bld 247 (*)    BUN 21 (*)     All other components within normal limits  BRAIN NATRIURETIC PEPTIDE - Abnormal; Notable for the following:    B Natriuretic Peptide 250.8 (*)    All other components within normal limits  I-STAT TROPOININ, ED    Imaging Review Dg Chest Portable 1 View  08/13/2015   CLINICAL DATA:  Shortness of breath. History of hypertension, diabetes and atrial fibrillation.  EXAM: PORTABLE CHEST - 1 VIEW  COMPARISON:  07/08/2015 and 06/25/2015.  FINDINGS: The heart appears mildly enlarged. There is vascular congestion with new perihilar and lower lobe airspace opacities most consistent with edema. There may be a small amount of pleural fluid bilaterally. No acute osseous findings are demonstrated. Telemetry leads overlie the chest.  IMPRESSION: New bilateral pulmonary opacities most consistent with edema and congestive heart failure. Possible small bilateral pleural effusions.   Electronically Signed   By: Carey Bullocks M.D.   On: 08/13/2015 19:58   I have personally reviewed and evaluated these images and lab results as part of my medical decision-making.   EKG Interpretation None      MDM   Final diagnoses:  Acute congestive heart failure, unspecified congestive heart failure type  Acute dyspnea   Patient presents with clinical concern for acute heart failure with weight gain heart failure history crackles. Bedside ultrasound confirmed B lines and pleural effusions. Chest x-ray reviewed by myself concerning for acute pulmonary edema. Nitro paste and BiPAP ordered. Patient improved on reassessment. Page [facility] for admission to stepdown. Lasix ordered.  The patients results and plan were reviewed and discussed.   Any x-rays performed were independently reviewed by myself.   Differential diagnosis were considered with the presenting HPI.  Medications  nitroGLYCERIN (NITROGLYN) 2 % ointment 1 inch (not administered)  furosemide (LASIX) injection 40 mg (not administered)    Filed  Vitals:   08/13/15 1930 08/13/15 1939 08/13/15 1945 08/13/15 2000  BP:      Pulse: 56 56 55 54  Resp: 24 37 25 24  SpO2: 98% 99% 98% 99%    Final diagnoses:  Acute congestive heart failure, unspecified congestive heart failure type  Acute dyspnea    Admission/ observation were discussed with the admitting physician, patient and/or family and they are comfortable with the plan.      Blane Ohara, MD 08/13/15 [phone]

## 2015-08-13 NOTE — ED Notes (Signed)
Per EMS, pt coming in for SOB which started today. Pt denies fever, cough or CP. Pt alert x4. Pt received 5ml of albuterol in route. Pts SpO2 was 89% on 4L [place]. Pt was then placed on NRB at 15L which improved his SpO2 to 98%. Pt also has increased swelling to his legs. Pt only able to speak two words at a time. Pt alert x4.

## 2015-08-13 NOTE — ED Notes (Signed)
Pt reporting he has been having trouble voiding due to prostatitis and is unable to urinate.

## 2015-08-13 NOTE — Progress Notes (Signed)
Placed pt on BiPAP, tolerating well at this time.

## 2015-08-13 NOTE — H&P (Signed)
[facility] Admission History and Physical       Raymond Gordon WUJ:[id number] DOB: 22-Jun-1951 DOA: 08/13/2015  Referring physician: EDP PCP: Gaspar Garbe, MD  Specialists:   Chief Complaint: Worsening SOB  HPI: Raymond Gordon is a 64 y.o. male with a  history of Diastolic CHF, EF =95% on last 2D ECHO 06/2015, HTN, PAF on Pradxaa Rx, DM2 and OSA who presents to the ED with complaints of worsening SOB, DOE and orthopnea since last night.  He also reports having increasing lower leg edema x 5 days.  He denies Chest Pain.  EMS was called and he was found to have hypoxia with O2 sats of 89%  And he was placed on 4 liters NCO2.   In the ED he continue to have respiratory distress and his O2 was increased to a NRB at 15 liters and then he was placed on BiPAP.   He had findings of CHF on chest  X-Ray and was administered 40 mg of IV Lasix  X 1.      Review of Systems:  Constitutional: No Weight Loss, No Weight Gain, Night Sweats, Fevers, Chills, Dizziness, Light Headedness, Fatigue, or Generalized Weakness HEENT: No Headaches, Difficulty Swallowing,Tooth/Dental Problems,Sore Throat,  No Sneezing, Rhinitis, Ear Ache, Nasal Congestion, or Post Nasal Drip,  Cardio-vascular:  No Chest pain, +Orthopnea, PND, +Edema in Lower Extremities, Anasarca, Dizziness, Palpitations  Resp:  +Dyspnea, +DOE, No Productive Cough, No Non-Productive Cough, No Hemoptysis, No Wheezing.   GI: No Heartburn, Indigestion, Abdominal Pain, Nausea, Vomiting, Diarrhea, Constipation, Hematemesis, Hematochezia, Melena, Change in Bowel Habits,  Loss of Appetite  GU: No Dysuria, No Change in Color of Urine, No Urgency or Urinary Frequency, No Flank pain.  Musculoskeletal: No Joint Pain or Swelling, No Decreased Range of Motion, No Back Pain.  Neurologic: No Syncope, No Seizures, Muscle Weakness, Paresthesia, Vision Disturbance or Loss, No Diplopia, No Vertigo, No Difficulty Walking,  Skin: No Rash or Lesions. Psych: No  Change in Mood or Affect, No Depression or Anxiety, No Memory loss, No Confusion, or Hallucinations   Past Medical History  Diagnosis Date  . Paroxysmal atrial fibrillation   . Hypertension   . Hyperlipidemia   . Obesity   . Diabetes   . Dysrhythmia   . Obesity   . Bilateral lower extremity edema      Past Surgical History  Procedure Laterality Date  . Cardiac catheterization    . Cardiac catheterization N/A 2012    no large vessel occlusions      Prior to Admission medications   Medication Sig Start Date End Date Taking? Authorizing Provider  allopurinol (ZYLOPRIM) 300 MG tablet Take 300 mg by mouth daily.  05/02/15   Historical Provider, MD  aspirin 81 MG tablet Take 81 mg by mouth daily.    Historical Provider, MD  atorvastatin (LIPITOR) 40 MG tablet Take 40 mg by mouth daily at 6 PM.  11/02/13   Historical Provider, MD  carvedilol (COREG) 25 MG tablet Take 1 tablet (25 mg total) by mouth 2 (two) times daily with a meal. 07/10/15   Runell Gess, MD  colchicine 0.6 MG tablet Take 0.6 mg by mouth daily.  05/02/15   Historical Provider, MD  dabigatran (PRADAXA) 150 MG CAPS Take 1 capsule (150 mg total) by mouth every 12 (twelve) hours. 04/08/13   Runell Gess, MD  Dapagliflozin Propanediol (FARXIGA) 10 MG TABS Take 10 mg by mouth daily. 05/21/14   Jarome Matin, MD  diltiazem [facility]  XR) 120 MG 24 hr capsule Take 1 capsule (120 mg total) by mouth daily. 07/09/15   Ripudeep Jenna Luo, MD  DIOVAN 160 MG tablet Take 160 mg by mouth 2 (two) times daily.  10/04/13   Historical Provider, MD  doxazosin (CARDURA) 4 MG tablet Take 4 mg by mouth daily.  11/04/13   Historical Provider, MD  furosemide (LASIX) 40 MG tablet Take 1 tablet (40 mg total) by mouth daily. 06/15/15   Lennette Bihari, MD  HUMALOG MIX 75/25 KWIKPEN (75-25) 100 UNIT/ML Kwikpen Inject 30-60 Units into the skin See admin instructions. Take 60 units in the morning, 30 units at lunch and 40 units in the evening 05/02/15    Historical Provider, MD  Multiple Vitamin (MULTIVITAMIN WITH MINERALS) TABS tablet Take 1 tablet by mouth daily.    Historical Provider, MD  omeprazole (PRILOSEC OTC) 20 MG tablet Take 1 tablet (20 mg total) by mouth daily. 11/09/14   Runell Gess, MD  ONE TOUCH ULTRA TEST test strip  04/19/15   Historical Provider, MD     No Known Allergies  Social History:  reports that he quit smoking about 17 years ago. He does not have any smokeless tobacco history on file. He reports that he does not drink alcohol or use illicit drugs.    Family History  Problem Relation Age of Onset  . Heart failure Brother   . Hyperlipidemia Brother   . Hypertension Brother   . Stroke Brother   . Hypertension Mother   . Hyperlipidemia Mother   . Diabetes Mother   . Cancer Father     prostate  . GER disease Father   . Stroke Father   . Hypertension Sister   . Hyperlipidemia Sister   . Hypertension Maternal Grandmother   . Stroke Maternal Grandmother   . Diabetes Maternal Grandfather   . Hypertension Maternal Grandfather        Physical Exam:  GEN:  Pleasant Morbidly Obese  64 y.o. African American male examined and in no acute distress currently on BiPAP Rx cooperative with exam Filed Vitals:   08/13/15 2000 08/13/15 2015 08/13/15 2030 08/13/15 2045  BP:  113/51 121/48 126/43  Pulse: 54 53 54 57  Resp: SpO2: 99% 100% 100% 100%   Blood pressure 126/43, pulse 57, resp. rate 24, SpO2 100 %. PSYCH: He is alert and oriented x4; does not appear anxious does not appear depressed; affect is normal HEENT: Normocephalic and Atraumatic, Mucous membranes pink; PERRLA; EOM intact; Fundi:  Benign;  No scleral icterus, Nares: Patent, Oropharynx: Clear, Fair Dentition,    Neck:  FROM, No Cervical Lymphadenopathy nor Thyromegaly or Carotid Bruit; No JVD; Breasts:: Not examined CHEST WALL: No tenderness CHEST: Normal respiration, clear to auscultation bilaterally HEART: Bradycardic  Regular Rate  no murmurs rubs or gallops BACK: No kyphosis or scoliosis; No CVA tenderness ABDOMEN: Positive Bowel Sounds, Obese, Soft Non-Tender, No Rebound or Guarding; No Masses, No Organomegaly. Rectal Exam: Not done EXTREMITIES: No Cyanosis, Clubbing, 2-3+BLE Edema; No Ulcerations. Genitalia: not examined PULSES: 2+ and symmetric SKIN: Normal hydration no rash or ulceration CNS:  Alert and Oriented x 4, No Focal Deficits Vascular: pulses palpable throughout    Labs on Admission:  Basic Metabolic Panel:  Recent Labs Lab 08/13/15 1935  NA 139  K 4.0  CL 112*  CO2 21*  GLUCOSE 247*  BUN 21*  CREATININE 1.07  CALCIUM 8.9   Liver Function Tests: No results for  input(s): AST, ALT, ALKPHOS, BILITOT, PROT, ALBUMIN in the last 168 hours. No results for input(s): LIPASE, AMYLASE in the last 168 hours. No results for input(s): AMMONIA in the last 168 hours. CBC:  Recent Labs Lab 08/13/15 1935  WBC 6.3  HGB 11.8*  HCT 36.3*  MCV 89.6  PLT 240   Cardiac Enzymes: No results for input(s): CKTOTAL, CKMB, CKMBINDEX, TROPONINI in the last 168 hours.  BNP (last 3 results)  Recent Labs  07/08/15 0634 08/13/15 1935  BNP 74.8 250.8*    ProBNP (last 3 results) No results for input(s): PROBNP in the last 8760 hours.  CBG: No results for input(s): GLUCAP in the last 168 hours.  Radiological Exams on Admission: Dg Chest Portable 1 View  08/13/2015   CLINICAL DATA:  Shortness of breath. History of hypertension, diabetes and atrial fibrillation.  EXAM: PORTABLE CHEST - 1 VIEW  COMPARISON:  07/08/2015 and 06/25/2015.  FINDINGS: The heart appears mildly enlarged. There is vascular congestion with new perihilar and lower lobe airspace opacities most consistent with edema. There may be a small amount of pleural fluid bilaterally. No acute osseous findings are demonstrated. Telemetry leads overlie the chest.  IMPRESSION: New bilateral pulmonary opacities most consistent with edema and congestive  heart failure. Possible small bilateral pleural effusions.   Electronically Signed   By: Carey Bullocks M.D.   On: 08/13/2015 19:58     EKG: Independently reviewed. Sinus Bradycardia, rate =59, +PVC, Diffuse Artifact   Assessment/Plan:   64 y.o. male with  Principal Problem:   1.     Acute diastolic CHF (congestive heart failure)   Acute CHF Protocol    Diuresis with 40 mg IV Lasix q 12 hrs x 4 doses    Strict I/Os    O2 PRN    Monitor O2 sats      Monitor Electrolytes   Active Problems:   2.    Acute respiratory failure with hypoxia   BiPAP/O2 PRN   Monitor O2 sats     3.    Uncontrolled diabetes mellitus   Continue     SSI coverage PRN   Check Hb A1C     4.    Atrial fibrillation   On Pradaxa Rx and Diltiazem and Carvedilol Rx   IV Lopressor while NPO     5.    Essential hypertension   On Carvedilol, Diltiazem, Diovan Rx, and Cardura Rx   Monitor BPs     6.    Hyperlipidemia   On Atorvastatin Rx     7.    Severe OSA (obstructive sleep apnea)   Monitor O2 sats     8.    DVT Prophylaxis   On Pradaxa        Gordon Status:     FULL Gordon   Family Communication:  No Family Present    Disposition Plan:    Inpatient  Status        Time spent:  65 Minutes    Ron Parker [facility] Pager [phone]   If 7AM -7PM Please Contact the Day Rounding Team MD for [facility]  If 7PM-7AM, Please Contact Night-Floor Coverage  [email or web address] Password [email or web address] 08/13/2015, 9:03 PM     ADDENDUM:   Patient was seen and examined on 08/13/2015

## 2015-08-14 DIAGNOSIS — I480 Paroxysmal atrial fibrillation: Secondary | ICD-10-CM

## 2015-08-14 DIAGNOSIS — E1165 Type 2 diabetes mellitus with hyperglycemia: Secondary | ICD-10-CM

## 2015-08-14 DIAGNOSIS — I5031 Acute diastolic (congestive) heart failure: Secondary | ICD-10-CM

## 2015-08-14 LAB — BASIC METABOLIC PANEL
ANION GAP: 8 (ref 5–15)
BUN: 20 mg/dL (ref 6–20)
CHLORIDE: 111 mmol/L (ref 101–111)
CO2: 21 mmol/L — ABNORMAL LOW (ref 22–32)
Calcium: 8.8 mg/dL — ABNORMAL LOW (ref 8.9–10.3)
Creatinine, Ser: 1.04 mg/dL (ref 0.61–1.24)
Glucose, Bld: 126 mg/dL — ABNORMAL HIGH (ref 65–99)
POTASSIUM: 3.9 mmol/L (ref 3.5–5.1)
SODIUM: 140 mmol/L (ref 135–145)

## 2015-08-14 LAB — GLUCOSE, CAPILLARY
GLUCOSE-CAPILLARY: 194 mg/dL — AB (ref 65–99)
Glucose-Capillary: 216 mg/dL — ABNORMAL HIGH (ref 65–99)

## 2015-08-14 LAB — I-STAT TROPONIN, ED: Troponin i, poc: 0 ng/mL (ref 0.00–0.08)

## 2015-08-14 LAB — CBG MONITORING, ED
GLUCOSE-CAPILLARY: 128 mg/dL — AB (ref 65–99)
GLUCOSE-CAPILLARY: 121 mg/dL — AB (ref 65–99)

## 2015-08-14 MED ORDER — CARVEDILOL 25 MG PO TABS
25.0000 mg | ORAL_TABLET | Freq: Two times a day (BID) | ORAL | Status: DC
  Administered 2015-08-14 – 2015-08-22 (×16): 25 mg via ORAL
  Filled 2015-08-14 (×16): qty 1

## 2015-08-14 MED ORDER — OMEPRAZOLE MAGNESIUM 20 MG PO TBEC: 20.0000 mg | DELAYED_RELEASE_TABLET | Freq: Every day | ORAL | Status: DC

## 2015-08-14 MED ORDER — DOXAZOSIN MESYLATE 4 MG PO TABS
4.0000 mg | ORAL_TABLET | Freq: Every day | ORAL | Status: DC
  Administered 2015-08-14 – 2015-08-22 (×9): 4 mg via ORAL
  Filled 2015-08-14 (×10): qty 1

## 2015-08-14 MED ORDER — METOPROLOL TARTRATE 1 MG/ML IV SOLN
2.5000 mg | Freq: Four times a day (QID) | INTRAVENOUS | Status: DC
  Administered 2015-08-14 – 2015-08-15 (×2): 2.5 mg via INTRAVENOUS
  Filled 2015-08-14 (×2): qty 5

## 2015-08-14 MED ORDER — NITROGLYCERIN 2 % TD OINT
0.5000 [in_us] | TOPICAL_OINTMENT | Freq: Four times a day (QID) | TRANSDERMAL | Status: DC
  Administered 2015-08-14 – 2015-08-15 (×3): 0.5 [in_us] via TOPICAL
  Filled 2015-08-14: qty 30

## 2015-08-14 MED ORDER — ALLOPURINOL 300 MG PO TABS
300.0000 mg | ORAL_TABLET | Freq: Every day | ORAL | Status: DC
  Administered 2015-08-14 – 2015-08-22 (×9): 300 mg via ORAL
  Filled 2015-08-14 (×9): qty 1

## 2015-08-14 MED ORDER — PANTOPRAZOLE SODIUM 40 MG PO TBEC
40.0000 mg | DELAYED_RELEASE_TABLET | Freq: Every day | ORAL | Status: DC
  Administered 2015-08-14 – 2015-08-22 (×9): 40 mg via ORAL
  Filled 2015-08-14 (×9): qty 1

## 2015-08-14 MED ORDER — INSULIN ASPART 100 UNIT/ML ~~LOC~~ SOLN
0.0000 [IU] | Freq: Three times a day (TID) | SUBCUTANEOUS | Status: DC
  Administered 2015-08-14: 3 [IU] via SUBCUTANEOUS
  Administered 2015-08-15 (×2): 5 [IU] via SUBCUTANEOUS
  Administered 2015-08-16: 3 [IU] via SUBCUTANEOUS
  Administered 2015-08-16 (×2): 2 [IU] via SUBCUTANEOUS
  Administered 2015-08-17: 3 [IU] via SUBCUTANEOUS
  Administered 2015-08-18: 2 [IU] via SUBCUTANEOUS
  Administered 2015-08-18: 3 [IU] via SUBCUTANEOUS
  Administered 2015-08-19: 2 [IU] via SUBCUTANEOUS
  Administered 2015-08-19: 8 [IU] via SUBCUTANEOUS
  Administered 2015-08-20: 2 [IU] via SUBCUTANEOUS
  Administered 2015-08-20: 5 [IU] via SUBCUTANEOUS
  Administered 2015-08-21: 3 [IU] via SUBCUTANEOUS
  Administered 2015-08-21: 5 [IU] via SUBCUTANEOUS
  Administered 2015-08-21: 2 [IU] via SUBCUTANEOUS
  Administered 2015-08-22 (×2): 3 [IU] via SUBCUTANEOUS

## 2015-08-14 MED ORDER — DABIGATRAN ETEXILATE MESYLATE 150 MG PO CAPS
150.0000 mg | ORAL_CAPSULE | Freq: Two times a day (BID) | ORAL | Status: DC
  Administered 2015-08-14 – 2015-08-22 (×16): 150 mg via ORAL
  Filled 2015-08-14 (×16): qty 1

## 2015-08-14 MED ORDER — IRBESARTAN 150 MG PO TABS
150.0000 mg | ORAL_TABLET | Freq: Every day | ORAL | Status: DC
  Administered 2015-08-14 – 2015-08-22 (×9): 150 mg via ORAL
  Filled 2015-08-14 (×9): qty 1

## 2015-08-14 MED ORDER — INSULIN ASPART 100 UNIT/ML ~~LOC~~ SOLN
0.0000 [IU] | Freq: Every day | SUBCUTANEOUS | Status: DC
  Administered 2015-08-14: 2 [IU] via SUBCUTANEOUS

## 2015-08-14 MED ORDER — TAMSULOSIN HCL 0.4 MG PO CAPS
0.4000 mg | ORAL_CAPSULE | Freq: Every day | ORAL | Status: DC
  Administered 2015-08-14 – 2015-08-21 (×8): 0.4 mg via ORAL
  Filled 2015-08-14 (×8): qty 1

## 2015-08-14 MED ORDER — DILTIAZEM HCL ER 120 MG PO CP24: 120.0000 mg | ORAL_CAPSULE | ORAL | Status: DC

## 2015-08-14 MED ORDER — DILTIAZEM HCL ER COATED BEADS 120 MG PO CP24
120.0000 mg | ORAL_CAPSULE | Freq: Every day | ORAL | Status: DC
  Administered 2015-08-14 – 2015-08-15 (×2): 120 mg via ORAL
  Filled 2015-08-14 (×2): qty 1

## 2015-08-14 MED ORDER — LEVALBUTEROL HCL 0.63 MG/3ML IN NEBU
0.6300 mg | INHALATION_SOLUTION | Freq: Three times a day (TID) | RESPIRATORY_TRACT | Status: DC | PRN
  Administered 2015-08-14: 0.63 mg via RESPIRATORY_TRACT
  Filled 2015-08-14: qty 3

## 2015-08-14 MED ORDER — DABIGATRAN ETEXILATE MESYLATE 150 MG PO CAPS: 150.0000 mg | ORAL_CAPSULE | Freq: Two times a day (BID) | ORAL | Status: DC

## 2015-08-14 MED ORDER — INSULIN ASPART 100 UNIT/ML ~~LOC~~ SOLN
0.0000 [IU] | SUBCUTANEOUS | Status: DC
  Administered 2015-08-14: 1 [IU] via SUBCUTANEOUS
  Filled 2015-08-14: qty 1

## 2015-08-14 MED ORDER — ATORVASTATIN CALCIUM 40 MG PO TABS
40.0000 mg | ORAL_TABLET | Freq: Every day | ORAL | Status: DC
  Administered 2015-08-14 – 2015-08-21 (×8): 40 mg via ORAL
  Filled 2015-08-14 (×7): qty 1

## 2015-08-14 NOTE — Progress Notes (Signed)
RT took pt off of BiPAP at this time and placed on 3L [place], tolerating well at this time. RT will continue to monitor

## 2015-08-14 NOTE — ED Notes (Signed)
Ordered diet tray 

## 2015-08-14 NOTE — Progress Notes (Signed)
Utilization review complete. Alesia Shavis RN CCM Case Mgmt phone [phone] 

## 2015-08-14 NOTE — ED Notes (Addendum)
CHECKED CBG 121 RN EMILIE

## 2015-08-14 NOTE — Progress Notes (Signed)
Assumed care from off going RN ; patient up in chair with no c/o's; no signs of acute distress; denies pain; O2 @ 4L via ; family @ bedside

## 2015-08-14 NOTE — Progress Notes (Signed)
[facility]                                                                              Patient Demographics  Raymond Gordon, is a 64 y.o. male, DOB - Aug 09, 1951, WGN:[id number]  Admit date - 08/13/2015   Admitting Physician No admitting provider for patient encounter.  Outpatient Primary MD for the patient is Gaspar Garbe, MD  LOS - 1   Chief Complaint  Patient presents with  . Shortness of Breath       Brief HPI   Raymond Gordon is a 64 y.o. male with a history of Diastolic CHF, EF =78% on last 2D ECHO 06/2015, HTN, PAF on Pradxaa Rx, DM2 and OSA who presents to the ED with complaints of worsening SOB, DOE and orthopnea since last night. He also reported having increasing lower leg edema x 5 days. He denied Chest Pain. EMS was called and he was found to have hypoxia with O2 sats of 89% And he was placed on 4 liters NCO2. In the ED he continue to have respiratory distress and his O2 was increased to a NRB at 15 liters and then he was placed on BiPAP.   Assessment & Plan    Principal Problem:  Acute on chronic respiratory failure secondary to Acute on chronic diastolic CHF (congestive heart failure), obesity hypoventilation, severe obstructive sleep apnea Acute on chronic diastolic CHF:  - 2-D echo in 8/16 showed EF of 65-70% with grade 2 diastolic dysfunction - Continue IV Lasix, strict I's and O's and daily weights - Currently off BiPAP, will obtain cardiology consult, follows Dr. Gery Pray   Active Problems:   Essential hypertension - Currently stable, continue Coreg, diltiazem, Diovan, Cardura    Hyperlipidemia - Continue statin    Severe OSA (obstructive sleep apnea), obesity hypoventilation, morbid obesity - Patient strongly counseled on diet and weight control    Uncontrolled diabetes mellitus - Placed on carb modified diet, obtain hemoglobin A1c, placed on sliding scale insulin    Atrial fibrillation - Continue diltiazem, Coreg -  Continue Pradaxa  Code Status: full code  Family Communication: Discussed in detail with the patient, all imaging results, lab results explained to the patient and wife   Disposition Plan: Not medically ready  Time Spent in minutes   25 minutes  Procedure Chest x-ray  consults Cardiology  DVT Prophylaxis Lovenox   medications   Scheduled Meds: . furosemide  40 mg Intravenous Q12H  . insulin aspart  0-9 Units Subcutaneous 6 times per day  . metoprolol  2.5 mg Intravenous 4 times per day  . nitroGLYCERIN  0.5 inch Topical 4 times per day  . potassium chloride  10 mEq Oral Daily  . sodium chloride  3 mL Intravenous Q12H   Continuous Infusions: . sodium chloride     PRN Meds:.sodium chloride, acetaminophen, ondansetron (ZOFRAN) IV, sodium chloride   Antibiotics   Anti-infectives    None        Subjective:   Raymond Gordon was seen and examined today.  shortness of breath is improving, off BiPAP.Patient denies dizziness, chest pain,  abdominal pain, N/V/D/C, new weakness, numbess,  tingling. No acute events overnight.    Objective:   Blood pressure 130/50, pulse 72, temperature 97.5 F (36.4 C), temperature source Rectal, resp. rate 14, SpO2 97 %.  Wt Readings from Last 3 Encounters:  07/26/15 127.007 kg (280 lb)  07/09/15 119.75 kg (264 lb)  06/15/15 126.009 kg (277 lb 12.8 oz)    No intake or output data in the 24 hours ending 08/14/15 1213  Exam  General: Alert and oriented x 3, NAD  HEENT:  PERRLA, EOMI, Anicteric Sclera, mucous membranes moist.   Neck: Supple  CVS: S1 S2 auscultated, no rubs, murmurs or gallops. Regular rate and rhythm.  Resp: Decreased breath sounds at the bases  Abdomen :  morbidly obese Soft, nontender, distended, + bowel sounds  Ext: no cyanosis clubbing, 2+ pitting edema   Neuro: AAOx3, Cr N's II- XII. Strength 5/5 upper and lower extremities bilaterally  Skin: No rashes  Psych: Normal affect and demeanor, alert and  oriented x3    Data Review   Micro Results No results found for this or any previous visit (from the past 240 hour(s)).  Radiology Reports Dg Chest Portable 1 View  08/13/2015   CLINICAL DATA:  Shortness of breath. History of hypertension, diabetes and atrial fibrillation.  EXAM: PORTABLE CHEST - 1 VIEW  COMPARISON:  07/08/2015 and 06/25/2015.  FINDINGS: The heart appears mildly enlarged. There is vascular congestion with new perihilar and lower lobe airspace opacities most consistent with edema. There may be a small amount of pleural fluid bilaterally. No acute osseous findings are demonstrated. Telemetry leads overlie the chest.  IMPRESSION: New bilateral pulmonary opacities most consistent with edema and congestive heart failure. Possible small bilateral pleural effusions.   Electronically Signed   By: Carey Bullocks M.D.   On: 08/13/2015 19:58    CBC  Recent Labs Lab 08/13/15 1935  WBC 6.3  HGB 11.8*  HCT 36.3*  PLT 240  MCV 89.6  MCH 29.1  MCHC 32.5  RDW 14.5    Chemistries   Recent Labs Lab 08/13/15 1935 08/14/15 0208  NA 139 140  K 4.0 3.9  CL 112* 111  CO2 21* 21*  GLUCOSE 247* 126*  BUN 21* 20  CREATININE 1.07 1.04  CALCIUM 8.9 8.8*   ------------------------------------------------------------------------------------------------------------------ CrCl cannot be calculated (Unknown ideal weight.). ------------------------------------------------------------------------------------------------------------------ No results for input(s): HGBA1C in the last 72 hours. ------------------------------------------------------------------------------------------------------------------ No results for input(s): CHOL, HDL, LDLCALC, TRIG, CHOLHDL, LDLDIRECT in the last 72 hours. ------------------------------------------------------------------------------------------------------------------ No results for input(s): TSH, T4TOTAL, T3FREE, THYROIDAB in the last 72  hours.  Invalid input(s): FREET3 ------------------------------------------------------------------------------------------------------------------ No results for input(s): VITAMINB12, FOLATE, FERRITIN, TIBC, IRON, RETICCTPCT in the last 72 hours.  Coagulation profile No results for input(s): INR, PROTIME in the last 168 hours.  No results for input(s): DDIMER in the last 72 hours.  Cardiac Enzymes No results for input(s): CKMB, TROPONINI, MYOGLOBIN in the last 168 hours.  Invalid input(s): CK ------------------------------------------------------------------------------------------------------------------ Invalid input(s): POCBNP   Recent Labs  08/14/15 0244 08/14/15 0433  GLUCAP 128* 121*     RAI,RIPUDEEP M.D. [facility] 08/14/2015, 12:13 PM  Pager: [phone] Between 7am to 7pm - call Pager - [phone]  After 7pm go to [email or web address] - password [email or web address]  Call night coverage person covering after 7pm

## 2015-08-14 NOTE — Consult Note (Signed)
Cardiologist:  Raymond Gordon  Reason for Consult: Acute heart failure Referring Physician:    [name] is an 64 y.o. male.  HPI:   The patient is an obese 64yo male with a history of PAF, HTN, HLD, HTN, HLD, DM.  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  He has a remote history of tobacco abuse having quit smoking back in 1999 and had smoked 1 pack per day. His father did have an MI and had bypass surgery. He was admitted to [facility] July 17, 2011, with chest pain and A-fib. He was catheterized by Dr. Mali Gordon on July 18, 2011, revealing minimal luminal irregularities with small vessel disease and normal LV function. He ultimately spontaneously converted to sinus rhythm and was placed on Pradaxa because of a CHADS2 score of 2.  He does give symptoms compatible with obstructive sleep apnea and currently wears CPAP with benefit. He was admitted to [facility] 07/08/15 with A. Fib with RVR and chest pain. His enzymes were negative. He is placed in IV due to diltiazem and converted spontaneously to sinus rhythm. His amlodipine was changed to by mouth diltiazem.  Most recent echocardiogram was 07/09/2015 revealed an ejection fraction of 65-70% with normal wall motion. Was grade 2 diastolic dysfunction. Left atrium was mildly to moderately dilated.  The patient presents with complaints of SOB which started abruptly Saturday night into Sunday.  It seems to be worse with extersion.  He sleeps with a CPAP at night.  He has considerable LEE and coughs occasionaly.  The patient currently denies nausea, vomiting, fever, chest pain, orthopnea, dizziness, PND,  congestion, abdominal pain, hematochezia, melena.  He does not get any exercise.    Past Medical History  Diagnosis Date  . Paroxysmal atrial fibrillation   . Hypertension   . Hyperlipidemia   . Obesity   . Diabetes   . Dysrhythmia   . Obesity   . Bilateral lower extremity edema     Past  Raymond History  Procedure Laterality Date  . Cardiac catheterization    . Cardiac catheterization N/A 2012    no large vessel occlusions    Family History  Problem Relation Age of Onset  . Heart failure Brother   . Hyperlipidemia Brother   . Hypertension Brother   . Stroke Brother   . Hypertension Mother   . Hyperlipidemia Mother   . Diabetes Mother   . Cancer Father     prostate  . GER disease Father   . Stroke Father   . Hypertension Sister   . Hyperlipidemia Sister   . Hypertension Maternal Grandmother   . Stroke Maternal Grandmother   . Diabetes Maternal Grandfather   . Hypertension Maternal Grandfather     Social History:  reports that he quit smoking about 17 years ago. He does not have any smokeless tobacco history on file. He reports that he does not drink alcohol or use illicit drugs.  Allergies: No Known Allergies  Medications:  Scheduled Meds: . dabigatran  150 mg Oral Q12H  . furosemide  40 mg Intravenous Q12H  . insulin aspart  0-15 Units Subcutaneous TID WC  . insulin aspart  0-5 Units Subcutaneous QHS  . metoprolol  2.5 mg Intravenous 4 times per day  . nitroGLYCERIN  0.5 inch Topical 4 times per day  . potassium chloride  10 mEq Oral Daily  . sodium chloride  3 mL Intravenous Q12H  Continuous Infusions:  PRN Meds:.sodium chloride, acetaminophen, levalbuterol, ondansetron (ZOFRAN) IV, sodium chloride   Results for orders placed or performed during the Gordon encounter of 08/13/15 (from the past 48 hour(s))  CBC     Status: Abnormal   Collection Time: 08/13/15  7:35 PM  Result Value Ref Range   WBC 6.3 4.0 - 10.5 K/uL   RBC 4.05 (L) 4.22 - 5.81 MIL/uL   Hemoglobin 11.8 (L) 13.0 - 17.0 g/dL   HCT 36.3 (L) 39.0 - 52.0 %   MCV 89.6 78.0 - 100.0 fL   MCH 29.1 26.0 - 34.0 pg   MCHC 32.5 30.0 - 36.0 g/dL   RDW 14.5 11.5 - 15.5 %   Platelets 240 150 - 400 K/uL  Basic metabolic panel     Status: Abnormal   Collection Time: 08/13/15  7:35 PM   Result Value Ref Range   Sodium 139 135 - 145 mmol/L   Potassium 4.0 3.5 - 5.1 mmol/L   Chloride 112 (H) 101 - 111 mmol/L   CO2 21 (L) 22 - 32 mmol/L   Glucose, Bld 247 (H) 65 - 99 mg/dL   BUN 21 (H) 6 - 20 mg/dL   Creatinine, Ser 1.07 0.61 - 1.24 mg/dL   Calcium 8.9 8.9 - 10.3 mg/dL   GFR calc non Af Amer >60 >60 mL/min   GFR calc Af Amer >60 >60 mL/min    Comment: (NOTE) The eGFR has been calculated using the CKD EPI equation. This calculation has not been validated in all clinical situations. eGFR's persistently <60 mL/min signify possible Chronic Kidney Disease.    Anion gap 6 5 - 15  Brain natriuretic peptide     Status: Abnormal   Collection Time: 08/13/15  7:35 PM  Result Value Ref Range   B Natriuretic Peptide 250.8 (H) 0.0 - 100.0 pg/mL  I-stat troponin, ED (0, 3, 6)  not at [facility], [facility]     Status: None   Collection Time: 08/13/15  7:54 PM  Result Value Ref Range   Troponin i, poc 0.00 0.00 - 0.08 ng/mL   Comment 3            Comment: Due to the release kinetics of cTnI, a negative result within the first hours of the onset of symptoms does not rule out myocardial infarction with certainty. If myocardial infarction is still suspected, repeat the test at appropriate intervals.   I-stat troponin, ED (0, 3, 6)  not at [facility], [facility]     Status: None   Collection Time: 08/13/15 10:54 PM  Result Value Ref Range   Troponin i, poc 0.01 0.00 - 0.08 ng/mL   Comment 3            Comment: Due to the release kinetics of cTnI, a negative result within the first hours of the onset of symptoms does not rule out myocardial infarction with certainty. If myocardial infarction is still suspected, repeat the test at appropriate intervals.   Basic metabolic panel     Status: Abnormal   Collection Time: 08/14/15  2:08 AM  Result Value Ref Range   Sodium 140 135 - 145 mmol/L   Potassium 3.9 3.5 - 5.1 mmol/L   Chloride 111 101 - 111 mmol/L   CO2 21 (L) 22 - 32 mmol/L   Glucose, Bld  126 (H) 65 - 99 mg/dL   BUN 20 6 - 20 mg/dL   Creatinine, Ser 1.04 0.61 - 1.24 mg/dL   Calcium 8.8 (L)  8.9 - 10.3 mg/dL   GFR calc non Af Amer >60 >60 mL/min   GFR calc Af Amer >60 >60 mL/min    Comment: (NOTE) The eGFR has been calculated using the CKD EPI equation. This calculation has not been validated in all clinical situations. eGFR's persistently <60 mL/min signify possible Chronic Kidney Disease.    Anion gap 8 5 - 15  I-stat troponin, ED (0, 3, 6)  not at [facility], [facility]     Status: None   Collection Time: 08/14/15  2:10 AM  Result Value Ref Range   Troponin i, poc 0.00 0.00 - 0.08 ng/mL   Comment 3            Comment: Due to the release kinetics of cTnI, a negative result within the first hours of the onset of symptoms does not rule out myocardial infarction with certainty. If myocardial infarction is still suspected, repeat the test at appropriate intervals.   CBG monitoring, ED     Status: Abnormal   Collection Time: 08/14/15  2:44 AM  Result Value Ref Range   Glucose-Capillary 128 (H) 65 - 99 mg/dL  CBG monitoring, ED     Status: Abnormal   Collection Time: 08/14/15  4:33 AM  Result Value Ref Range   Glucose-Capillary 121 (H) 65 - 99 mg/dL   Comment 1 Notify RN     Dg Chest Portable 1 View  08/13/2015   CLINICAL DATA:  Shortness of breath. History of hypertension, diabetes and atrial fibrillation.  EXAM: PORTABLE CHEST - 1 VIEW  COMPARISON:  07/08/2015 and 06/25/2015.  FINDINGS: The heart appears mildly enlarged. There is vascular congestion with new perihilar and lower lobe airspace opacities most consistent with edema. There may be a small amount of pleural fluid bilaterally. No acute osseous findings are demonstrated. Telemetry leads overlie the chest.  IMPRESSION: New bilateral pulmonary opacities most consistent with edema and congestive heart failure. Possible small bilateral pleural effusions.   Electronically Signed   By: Richardean Sale M.D.   On: 08/13/2015 19:58     Review of Systems  Constitutional: Negative for fever and diaphoresis.  HENT: Negative for congestion.   Respiratory: Positive for shortness of breath and wheezing. Negative for cough.   Cardiovascular: Positive for leg swelling. Negative for chest pain, palpitations, orthopnea and PND.  Gastrointestinal: Negative for nausea, vomiting, abdominal pain, blood in stool and melena.  Musculoskeletal: Negative for myalgias.  Neurological: Negative for dizziness.  All other systems reviewed and are negative.  Blood pressure 145/52, pulse 75, temperature 98.4 F (36.9 C), temperature source Oral, resp. rate 20, SpO2 96 %. Physical Exam  Nursing note and vitals reviewed. Constitutional: He is oriented to person, place, and time. He appears well-developed. No distress.  Oese  HENT:  Head: Normocephalic and atraumatic.  Eyes: EOM are normal. Pupils are equal, round, and reactive to light. No scleral icterus.  Neck: Normal range of motion. Neck supple.  Cardiovascular: Normal rate, regular rhythm, S1 normal and S2 normal.   No murmur heard. Pulses:      Radial pulses are 2+ on the right side, and 2+ on the left side.  Respiratory: Effort normal. He has wheezes. He has no rales.  Not moving a lot of air.  GI: Soft. Bowel sounds are normal. He exhibits distension. There is no tenderness.  Dull to percussion  Musculoskeletal: He exhibits edema.  2-3 pitting edema  Neurological: He is alert and oriented to person, place, and time. He exhibits normal  muscle tone.  Skin: Skin is warm and dry.  Psychiatric: He has a normal mood and affect.    Assessment/Plan: Principal Problem:   Acute diastolic CHF (congestive heart failure) G2DD and normal EF by recent echo.  The patient is significantly volume overloaded with 2-3 Pitting edema and abd distension.  Agree with 6m IV lasix BID.  SCr WNL.  Strict I/O.  He has a foley cath.  We discussed low sodium diet and daily weight monitoring.        Essential hypertension  Continue home meds as tolerated.  He's had some mild hypotension.     Hyperlipidemia  Continue lipitor   Severe OSA (obstructive sleep apnea)  CPAP   Acute respiratory failure with hypoxia  Wheezing during exam.  Xopenex ordered.     Uncontrolled diabetes mellitus  Per IM   Atrial fibrillation  Maintaining sinus rhythm.  Continue pradaxa.   Home Coreg and diltiazem as BP allows.      Morbidly obese with severe deconditioning.    HTarri Fuller PVa [facility]9/19/2016, 2:44 PM   I have personally seen and examined this patient with BTarri Fuller PA-C. I agree with the assessment and plan as outlined above. He is admitted with volume overload. He has normal LV systolic function. He is in sinus. Agree with diuresis with IV Lasix. We will follow along with you.   MCALHANY,CHRISTOPHER 08/14/2015 4:24 PM

## 2015-08-14 NOTE — Progress Notes (Signed)
Pt seen, no respiratory distress noted or voiced by pt.  HR64, rr20, spo2 96% on 4lnc.  Bipap not found in room.  Pt and family member state that he wears cpap nocturnally at home with a full face mask.  This RT will notifiy RN to obtain rx and will return later to place cpap on pt.

## 2015-08-15 ENCOUNTER — Encounter (HOSPITAL_COMMUNITY): Payer: Self-pay | Admitting: General Practice

## 2015-08-15 DIAGNOSIS — I5032 Chronic diastolic (congestive) heart failure: Secondary | ICD-10-CM

## 2015-08-15 HISTORY — DX: Chronic diastolic (congestive) heart failure: I50.32

## 2015-08-15 LAB — BASIC METABOLIC PANEL
Anion gap: 6 (ref 5–15)
BUN: 21 mg/dL — AB (ref 6–20)
CO2: 22 mmol/L (ref 22–32)
Calcium: 8.7 mg/dL — ABNORMAL LOW (ref 8.9–10.3)
Chloride: 110 mmol/L (ref 101–111)
Creatinine, Ser: 1.14 mg/dL (ref 0.61–1.24)
GFR calc Af Amer: >60 mL/min (ref >60)
GLUCOSE: 217 mg/dL — AB (ref 65–99)
Potassium: 4.2 mmol/L (ref 3.5–5.1)
Sodium: 138 mmol/L (ref 135–145)

## 2015-08-15 LAB — GLUCOSE, CAPILLARY
Glucose-Capillary: 201 mg/dL — ABNORMAL HIGH (ref 65–99)
Glucose-Capillary: 239 mg/dL — ABNORMAL HIGH (ref 65–99)
Glucose-Capillary: 112 mg/dL — ABNORMAL HIGH (ref 65–99)

## 2015-08-15 MED ORDER — INSULIN GLARGINE 100 UNIT/ML ~~LOC~~ SOLN
10.0000 [IU] | Freq: Every day | SUBCUTANEOUS | Status: DC
  Administered 2015-08-15 – 2015-08-21 (×7): 10 [IU] via SUBCUTANEOUS
  Filled 2015-08-15 (×7): qty 0.1

## 2015-08-15 MED ORDER — FUROSEMIDE 10 MG/ML IJ SOLN
60.0000 mg | Freq: Two times a day (BID) | INTRAMUSCULAR | Status: DC
  Administered 2015-08-15 – 2015-08-16 (×2): 60 mg via INTRAVENOUS
  Filled 2015-08-15 (×2): qty 6

## 2015-08-15 MED ORDER — ISOSORBIDE MONONITRATE ER 30 MG PO TB24
30.0000 mg | ORAL_TABLET | Freq: Every day | ORAL | Status: DC
  Administered 2015-08-15 – 2015-08-22 (×8): 30 mg via ORAL
  Filled 2015-08-15 (×8): qty 1

## 2015-08-15 MED ORDER — INSULIN ASPART 100 UNIT/ML ~~LOC~~ SOLN
10.0000 [IU] | Freq: Three times a day (TID) | SUBCUTANEOUS | Status: DC
  Administered 2015-08-15 – 2015-08-22 (×22): 10 [IU] via SUBCUTANEOUS

## 2015-08-15 NOTE — Progress Notes (Signed)
[facility]                                                                              Patient Demographics  Raymond Gordon, is a 64 y.o. male, DOB - [birth year], ZOX:[id number]  Admit date - 08/13/2015   Admitting Physician Ron Parker, MD  Outpatient Primary MD for the patient is Gaspar Garbe, MD  LOS - 2   Chief Complaint  Patient presents with  . Shortness of Breath       Brief HPI   Raymond Gordon is a 64 y.o. male with a history of Diastolic CHF, EF =81% on last 2D ECHO 06/2015, HTN, PAF on Pradxaa Rx, DM2 and OSA who presents to the ED with complaints of worsening SOB, DOE and orthopnea since last night. He also reported having increasing lower leg edema x 5 days. He denied Chest Pain. EMS was called and he was found to have hypoxia with O2 sats of 89% And he was placed on 4 liters NCO2. In the ED he continue to have respiratory distress and his O2 was increased to a NRB at 15 liters and then he was placed on BiPAP.   Assessment & Plan    Principal Problem:  Acute on chronic respiratory failure secondary to Acute on chronic diastolic CHF (congestive heart failure), obesity hypoventilation, severe obstructive sleep apnea Acute on chronic diastolic CHF:  - 2-D echo in 8/16 showed EF of 65-70% with grade 2 diastolic dysfunction - Continue IV Lasix, negative balance of 1.5 L, check daily weight - Cardiology following   Active Problems:   Essential hypertension - Currently stable, continue Coreg, diltiazem, Diovan, Cardura    Hyperlipidemia - Continue statin    Severe OSA (obstructive sleep apnea), obesity hypoventilation, morbid obesity - Patient strongly counseled on diet and weight control - CPAP daily at bedtime    Uncontrolled diabetes mellitus - Blood sugars elevated, hemoglobin A1c 8.0 - Placed on sliding scale, added meal coverage 10units TID, Lantus 10units     Atrial fibrillation - Continue diltiazem, Coreg - Continue  Pradaxa  Code Status: full code  Family Communication: Discussed in detail with the patient, all imaging results, lab results explained to the patient and wife   Disposition Plan: Not medically ready  Time Spent in minutes   25 minutes  Procedure Chest x-ray  consults Cardiology  DVT Prophylaxis Lovenox   medications   Scheduled Meds: . allopurinol  300 mg Oral Daily  . atorvastatin  40 mg Oral q1800  . carvedilol  25 mg Oral BID WC  . dabigatran  150 mg Oral Q12H  . diltiazem  120 mg Oral Daily  . doxazosin  4 mg Oral Daily  . furosemide  40 mg Intravenous Q12H  . insulin aspart  0-15 Units Subcutaneous TID WC  . insulin aspart  0-5 Units Subcutaneous QHS  . irbesartan  150 mg Oral Daily  . metoprolol  2.5 mg Intravenous 4 times per day  . nitroGLYCERIN  0.5 inch Topical 4 times per day  . pantoprazole  40 mg Oral Daily  . sodium chloride  3 mL Intravenous Q12H  .  tamsulosin  0.4 mg Oral QPC supper   Continuous Infusions:   PRN Meds:.sodium chloride, acetaminophen, levalbuterol, ondansetron (ZOFRAN) IV, sodium chloride   Antibiotics   Anti-infectives    None        Subjective:   Raymond Gordon was seen and examined today. Feels better today, off BiPAP. Denies any dizziness, chest pain,  abdominal pain, N/V/D/C, new weakness, numbess, tingling. No acute events overnight. Shortness of breath is improving, still significant peripheral edema   Objective:   Blood pressure 115/43, pulse 59, temperature 98.7 F (37.1 C), temperature source Oral, resp. rate 20, height  (1.651 m), SpO2 95 %.  Wt Readings from Last 3 Encounters:  07/26/15 127.007 kg (280 lb)  07/09/15 119.75 kg (264 lb)  06/15/15 126.009 kg (277 lb 12.8 oz)     Intake/Output Summary (Last 24 hours) at 08/15/15 1151 Last data filed at 08/15/15 1057  Gross per 24 hour  Intake    240 ml  Output   1801 ml  Net  -1561 ml    Exam  General: Alert and oriented x 3, NAD  HEENT:   PERRLA, EOMI, Anicteric Sclera, mucous membranes moist.   Neck: Supple  CVS: S1 S2 clear, RRR  Resp: Decreased breath sounds at the bases  Abdomen :  morbidly obese Soft, nontender, distended, + bowel sounds  Ext: no cyanosis clubbing, 2+ pitting edema   Neuro: no new deficits  Skin: No rashes  Psych: Normal affect and demeanor, alert and oriented x3    Data Review   Micro Results No results found for this or any previous visit (from the past 240 hour(s)).  Radiology Reports Dg Chest Portable 1 View  08/13/2015   CLINICAL DATA:  Shortness of breath. History of hypertension, diabetes and atrial fibrillation.  EXAM: PORTABLE CHEST - 1 VIEW  COMPARISON:  07/08/2015 and 06/25/2015.  FINDINGS: The heart appears mildly enlarged. There is vascular congestion with new perihilar and lower lobe airspace opacities most consistent with edema. There may be a small amount of pleural fluid bilaterally. No acute osseous findings are demonstrated. Telemetry leads overlie the chest.  IMPRESSION: New bilateral pulmonary opacities most consistent with edema and congestive heart failure. Possible small bilateral pleural effusions.   Electronically Signed   By: Carey Bullocks M.D.   On: 08/13/2015 19:58    CBC  Recent Labs Lab 08/13/15 1935  WBC 6.3  HGB 11.8*  HCT 36.3*  PLT 240  MCV 89.6  MCH 29.1  MCHC 32.5  RDW 14.5    Chemistries   Recent Labs Lab 08/13/15 1935 08/14/15 0208 08/15/15 0508  NA 139 140 138  K 4.0 3.9 4.2  CL 112* 111 110  CO2 21* 21* 22  GLUCOSE 247* 126* 217*  BUN 21* 20 21*  CREATININE 1.07 1.04 1.14  CALCIUM 8.9 8.8* 8.7*   ------------------------------------------------------------------------------------------------------------------ CrCl cannot be calculated (Unknown ideal weight.). ------------------------------------------------------------------------------------------------------------------ No results for input(s): HGBA1C in the last 72  hours. ------------------------------------------------------------------------------------------------------------------ No results for input(s): CHOL, HDL, LDLCALC, TRIG, CHOLHDL, LDLDIRECT in the last 72 hours. ------------------------------------------------------------------------------------------------------------------ No results for input(s): TSH, T4TOTAL, T3FREE, THYROIDAB in the last 72 hours.  Invalid input(s): FREET3 ------------------------------------------------------------------------------------------------------------------ No results for input(s): VITAMINB12, FOLATE, FERRITIN, TIBC, IRON, RETICCTPCT in the last 72 hours.  Coagulation profile No results for input(s): INR, PROTIME in the last 168 hours.  No results for input(s): DDIMER in the last 72 hours.  Cardiac Enzymes No results for input(s): CKMB, TROPONINI, MYOGLOBIN in the last 168 hours.  Invalid input(s): CK ------------------------------------------------------------------------------------------------------------------ Invalid input(s): POCBNP   Recent Labs  08/14/15 0244 08/14/15 0433 08/14/15 1641 08/14/15 2127 08/15/15 0738 08/15/15 1128  GLUCAP 128* 121* 194* 216* 201* 239*     RAI,RIPUDEEP M.D. [facility] 08/15/2015, 11:51 AM  Pager: [phone] Between 7am to 7pm - call Pager - [phone]  After 7pm go to [email or web address] - password [email or web address]  Call night coverage person covering after 7pm

## 2015-08-15 NOTE — Progress Notes (Signed)
Assumed care this morning @ 0716 from off going RN; patient alert & oriented sitting in bed with O2 @ 2L ; denies SOB; no c/o's pain; patient up to chair with one person assist.; Tech assisted patient to [facility], small amount cream pus leaking form penis/foley; MD paged; will give report to on coming RN

## 2015-08-15 NOTE — Progress Notes (Signed)
Patient placed on CPAP for the night. RT will continue to monitor.  

## 2015-08-15 NOTE — Progress Notes (Signed)
Patient Name: Raymond Gordon Date of Encounter: 08/15/2015  Principal Problem:   Acute diastolic CHF (congestive heart failure) Active Problems:   Essential hypertension   Hyperlipidemia   Severe OSA (obstructive sleep apnea)   Acute respiratory failure with hypoxia   Uncontrolled diabetes mellitus   Atrial fibrillation   Primary Cardiologist: Dr Allyson Sabal  Patient Profile: 64yo male with a history of PAF, HTN, HLD, HTN, HLD, DM, remote tob, cath 2012 w/ mild dz, afib 2012 w/ spont conv SR, on Pradaxa. Had CP/afib 06/2015 spont conv SR & EF 65% w/ grade 2 diast dysf. Admitted 09/19 w/ SOB, CHF, urinary retention (swollen prostate).   SUBJECTIVE: Breathing a little better, tolerating Lasix well.  OBJECTIVE Filed Vitals:   08/14/15 2255 08/15/15 0000 08/15/15 0556 08/15/15 1100  BP:  123/41 115/43   Pulse: 59     Temp:   98.7 F (37.1 C)   TempSrc:      Resp: 20     Height:     (1.651 m)  SpO2: 96%  95%     Intake/Output Summary (Last 24 hours) at 08/15/15 1214 Last data filed at 08/15/15 1057  Gross per 24 hour  Intake    240 ml  Output   1801 ml  Net  -1561 ml   BP 115/43 mmHg  Pulse 59  Temp(Src) 98.7 F (37.1 C) (Oral)  Resp 20  Ht  (1.651 m)  SpO2 95% PHYSICAL EXAM General: Well developed, well nourished, male in mild respiratory distress. Head: Normocephalic, atraumatic.  Neck: Supple without bruits, JVD appears elevated but difficult to assess 2nd body habitus. Lungs:  Resp regular and unlabored, rales bases. Heart: RRR, S1, S2, no S3, S4, or murmur; no rub. Abdomen: Soft, non-tender, non-distended, BS + x 4.  Extremities: No clubbing, cyanosis, 2-3+ edema.  Neuro: Alert and oriented X 3. Moves all extremities spontaneously. Psych: Normal affect.  LABS: CBC:  Recent Labs  08/13/15 1935  WBC 6.3  HGB 11.8*  HCT 36.3*  MCV 89.6  PLT 240   Basic Metabolic Panel:  Recent Labs  96/04/54 0208 08/15/15 0508  NA 140 138  K 3.9 4.2    CL 111 110  CO2 21* 22  GLUCOSE 126* 217*  BUN 20 21*  CREATININE 1.04 1.14  CALCIUM 8.8* 8.7*    Recent Labs  08/13/15 2254 08/14/15 0210  TROPIPOC 0.01 0.00   BNP:  B NATRIURETIC PEPTIDE  Date/Time Value Ref Range Status  08/13/2015 07:35 PM 250.8* 0.0 - 100.0 pg/mL Final  07/08/2015 06:34 AM 74.8 0.0 - 100.0 pg/mL Final   TELE: SR, mostly sinus brady       Radiology/Studies: Dg Chest Portable 1 View 08/13/2015   CLINICAL DATA:  Shortness of breath. History of hypertension, diabetes and atrial fibrillation.  EXAM: PORTABLE CHEST - 1 VIEW  COMPARISON:  07/08/2015 and 06/25/2015.  FINDINGS: The heart appears mildly enlarged. There is vascular congestion with new perihilar and lower lobe airspace opacities most consistent with edema. There may be a small amount of pleural fluid bilaterally. No acute osseous findings are demonstrated. Telemetry leads overlie the chest.  IMPRESSION: New bilateral pulmonary opacities most consistent with edema and congestive heart failure. Possible small bilateral pleural effusions.   Electronically Signed   By: Carey Bullocks M.D.   On: 08/13/2015 19:58     Current Medications:  . allopurinol  300 mg Oral Daily  . atorvastatin  40 mg Oral q1800  .  carvedilol  25 mg Oral BID WC  . dabigatran  150 mg Oral Q12H  . diltiazem  120 mg Oral Daily  . doxazosin  4 mg Oral Daily  . furosemide  40 mg Intravenous Q12H  . insulin aspart  0-15 Units Subcutaneous TID WC  . insulin aspart  0-5 Units Subcutaneous QHS  . insulin aspart  10 Units Subcutaneous TID WC  . insulin glargine  10 Units Subcutaneous Daily  . irbesartan  150 mg Oral Daily  . metoprolol  2.5 mg Intravenous 4 times per day  . nitroGLYCERIN  0.5 inch Topical 4 times per day  . pantoprazole  40 mg Oral Daily  . sodium chloride  3 mL Intravenous Q12H  . tamsulosin  0.4 mg Oral QPC supper      ASSESSMENT AND PLAN: Principal Problem:   Acute diastolic CHF (congestive heart failure) -  Needs daily weights, RN will obtain - Needs more aggressive diuresis, increase Lasix to 60 mg IV BID, not sure BP will tolerate 80 mg - change nitrates to PO and d/c IV metoprolol since on Coreg - may need to decrease irbesartan   Active Problems:   Essential hypertension - good control now    Hyperlipidemia - continue statin    Severe OSA (obstructive sleep apnea) - on CPAP at home and here     Acute respiratory failure with hypoxia - should improve w/ diuresis    Uncontrolled diabetes mellitus - on home rx and SSI - A1c pending    Atrial fibrillation - maintaining SR in hosp - started on Cardizem 06/2015 - Coreg dose unchanged - MD advise on continuing Cardizem, concern for possible contribution to edema    Urinary retention - continue Flomax and Cardura  Signed, Barrett, Rhonda , PA-C 12:14 PM 08/15/2015  Patient seen, examined. Available data reviewed. Agree with findings, assessment, and plan as outlined by Theodore Demark, PA-C. The patient's heart is regular rate and rhythm. Lung fields are clear. He has diffuse 2-3+ edema in his lower extremities. There are chronic stasis changes bilaterally. I agree with discontinuing Cardizem as this may be contributing to his lower extremity edema. Also will increase furosemide as outlined above. The patient needs major lifestyle modification - he was counseled regarding the need for weight loss in the impact of his morbid obesity on diastolic heart failure.  Tonny Bollman, M.D. 08/15/2015 1:05 PM

## 2015-08-16 ENCOUNTER — Ambulatory Visit: Payer: Medicare Other | Admitting: Cardiovascular Disease

## 2015-08-16 DIAGNOSIS — I5033 Acute on chronic diastolic (congestive) heart failure: Secondary | ICD-10-CM

## 2015-08-16 LAB — BASIC METABOLIC PANEL
Anion gap: 7 (ref 5–15)
BUN: 21 mg/dL — AB (ref 6–20)
CALCIUM: 8.9 mg/dL (ref 8.9–10.3)
CO2: 24 mmol/L (ref 22–32)
Chloride: 108 mmol/L (ref 101–111)
Creatinine, Ser: 1.07 mg/dL (ref 0.61–1.24)
GFR calc Af Amer: >60 mL/min (ref >60)
Glucose, Bld: 102 mg/dL — ABNORMAL HIGH (ref 65–99)
POTASSIUM: 3.9 mmol/L (ref 3.5–5.1)
SODIUM: 139 mmol/L (ref 135–145)

## 2015-08-16 LAB — GLUCOSE, CAPILLARY
GLUCOSE-CAPILLARY: 82 mg/dL (ref 65–99)
GLUCOSE-CAPILLARY: 128 mg/dL — AB (ref 65–99)
Glucose-Capillary: 129 mg/dL — ABNORMAL HIGH (ref 65–99)
Glucose-Capillary: 188 mg/dL — ABNORMAL HIGH (ref 65–99)
Glucose-Capillary: 95 mg/dL (ref 65–99)

## 2015-08-16 LAB — HEMOGLOBIN A1C
Hgb A1c MFr Bld: 8.7 % — ABNORMAL HIGH (ref 4.8–5.6)
Mean Plasma Glucose: 203 mg/dL

## 2015-08-16 MED ORDER — POTASSIUM CHLORIDE CRYS ER 20 MEQ PO TBCR
40.0000 meq | EXTENDED_RELEASE_TABLET | Freq: Once | ORAL | Status: AC
  Administered 2015-08-16: 40 meq via ORAL
  Filled 2015-08-16: qty 2

## 2015-08-16 MED ORDER — FUROSEMIDE 10 MG/ML IJ SOLN
60.0000 mg | Freq: Three times a day (TID) | INTRAMUSCULAR | Status: DC
  Administered 2015-08-16 – 2015-08-21 (×17): 60 mg via INTRAVENOUS
  Filled 2015-08-16 (×17): qty 6

## 2015-08-16 NOTE — Progress Notes (Signed)
Patient Name: Raymond Gordon Date of Encounter: 08/16/2015  Principal Problem:   Acute diastolic CHF (congestive heart failure) Active Problems:   Essential hypertension   Hyperlipidemia   Severe OSA (obstructive sleep apnea)   Acute respiratory failure with hypoxia   Uncontrolled diabetes mellitus   Atrial fibrillation   Primary Cardiologist: Dr Allyson Sabal  Patient Profile: 64yo male with a history of PAF, HTN, HLD, HTN, HLD, DM, remote tob, cath 2012 w/ mild dz, afib 2012 w/ spont conv SR, on Pradaxa. Had CP/afib 06/2015 spont conv SR & EF 65% w/ grade 2 diast dysf. Admitted 09/19 w/ SOB, CHF, urinary retention (swollen prostate).   SUBJECTIVE  Breathing improved, however not at baseline. Denies CP or palpitation.   CURRENT MEDS . allopurinol  300 mg Oral Daily  . atorvastatin  40 mg Oral q1800  . carvedilol  25 mg Oral BID WC  . dabigatran  150 mg Oral Q12H  . doxazosin  4 mg Oral Daily  . furosemide  60 mg Intravenous BID  . insulin aspart  0-15 Units Subcutaneous TID WC  . insulin aspart  0-5 Units Subcutaneous QHS  . insulin aspart  10 Units Subcutaneous TID WC  . insulin glargine  10 Units Subcutaneous Daily  . irbesartan  150 mg Oral Daily  . isosorbide mononitrate  30 mg Oral Daily  . pantoprazole  40 mg Oral Daily  . sodium chloride  3 mL Intravenous Q12H  . tamsulosin  0.4 mg Oral QPC supper    OBJECTIVE  Filed Vitals:   08/15/15 1243 08/15/15 1515 08/15/15 2045 08/16/15 0432  BP:  120/43 129/53 141/53  Pulse:  58 55 60  Temp:  98.3 F (36.8 C) 98.5 F (36.9 C) 98.4 F (36.9 C)  TempSrc:  Oral Oral Axillary  Resp:  20    Height:      Weight: 292 lb 1.8 oz (132.5 kg)   298 lb 4.8 oz (135.308 kg)  SpO2:  99% 99% 99%    Intake/Output Summary (Last 24 hours) at 08/16/15 0913 Last data filed at 08/16/15 0818  Gross per 24 hour  Intake    360 ml  Output   1700 ml  Net  -1340 ml   Filed Weights   08/15/15 1243 08/16/15 0432  Weight: 292 lb 1.8 oz  (132.5 kg) 298 lb 4.8 oz (135.308 kg)    PHYSICAL EXAM  General: Pleasant, NAD. Neuro: Alert and oriented X 3. Moves all extremities spontaneously. Psych: Normal affect. HEENT:  Normal  Neck: Supple without bruits. JVD  difficult to assess 2nd body habitus. Lungs:  Resp regular and unlabored. Diminished breath sound bibasilar.  Heart: RRR no s3, s4, or murmurs. Abdomen: Soft, non-tender, non-distended, BS + x 4.  Extremities: No clubbing, cyanosis. 2+ BL LE edema. DP/PT/Radials 2+ and equal bilaterally.  Accessory Clinical Findings  CBC  Recent Labs  08/13/15 1935  WBC 6.3  HGB 11.8*  HCT 36.3*  MCV 89.6  PLT 240   Basic Metabolic Panel  Recent Labs  08/15/15 0508 08/16/15 0356  NA 138 139  K 4.2 3.9  CL 110 108  CO2 22 24  GLUCOSE 217* 102*  BUN 21* 21*  CREATININE 1.14 1.07  CALCIUM 8.7* 8.9   Hemoglobin A1C  Recent Labs  08/14/15 1420  HGBA1C 8.7*    TELE  NSR  Radiology/Studies  Dg Chest Portable 1 View  08/13/2015   CLINICAL DATA:  Shortness of breath. History of hypertension, diabetes and  atrial fibrillation.  EXAM: PORTABLE CHEST - 1 VIEW  COMPARISON:  07/08/2015 and 06/25/2015.  FINDINGS: The heart appears mildly enlarged. There is vascular congestion with new perihilar and lower lobe airspace opacities most consistent with edema. There may be a small amount of pleural fluid bilaterally. No acute osseous findings are demonstrated. Telemetry leads overlie the chest.  IMPRESSION: New bilateral pulmonary opacities most consistent with edema and congestive heart failure. Possible small bilateral pleural effusions.   Electronically Signed   By: Carey Bullocks M.D.   On: 08/13/2015 19:58    ASSESSMENT AND PLAN  1. Acute diastolic CHF (congestive heart failure) - Diuresed 1.5L/2.2L. However weight up 6lb. Doubt accurate. Cr stable.  -Continue coreg and current dose of IV lasix. Discontinued cardizem as this may be contributing to his lower extremity  edema.  2. Essential hypertension - Relatively stable  3. Hyperlipidemia - continue statin  4. Severe OSA (obstructive sleep apnea) - on CPAP at home and here   5. Acute respiratory failure with hypoxia - improve w/ diuresis  6. Uncontrolled diabetes mellitus - on home rx and SSI - A1c 8.7  7. Atrial fibrillation - maintaining SR  -Continue Pradaxa and coreg  8. Urinary retention - per primary  Dispo: advice lifestyle and diatery modifications.   Signed, Bhagat,Bhavinkumar PA-C Pager [phone]  I have personally seen and examined this patient with B Bhagat, PA-C. I agree with the assessment and plan as outlined above. He is admitted with volume overload. Lungs are clear. Still with 2+ bil LE edema. RRR. Diuresing well but still volume overloaded. Will continue IV Lasix today but will increase to Lasix 60 mg TID. Repeat BMET in am.   MCALHANY,CHRISTOPHER 08/16/2015 11:01 AM

## 2015-08-16 NOTE — Progress Notes (Signed)
[facility]                                                                              Patient Demographics  Raymond Gordon, is a 64 y.o. male, DOB - Oct 19, 1951, ZOX:[id number]  Admit date - 08/13/2015   Admitting Physician Ron Parker, MD  Outpatient Primary MD for the patient is Gaspar Garbe, MD  LOS - 3   Chief Complaint  Patient presents with  . Shortness of Breath       Brief HPI   Raymond Gordon is a 64 y.o. male with a history of Diastolic CHF, EF =81% on last 2D ECHO 06/2015, HTN, PAF on Pradxaa Rx, DM2 and OSA who presents to the ED with complaints of worsening SOB, DOE and orthopnea since last night. He also reported having increasing lower leg edema x 5 days. He denied Chest Pain. EMS was called and he was found to have hypoxia with O2 sats of 89% And he was placed on 4 liters NCO2. In the ED he continue to have respiratory distress and his O2 was increased to a NRB at 15 liters and then he was placed on BiPAP.   Assessment & Plan     Acute on chronic respiratory failure secondary to Acute on chronic diastolic CHF (congestive heart failure), obesity hypoventilation, severe obstructive sleep apnea Acute on chronic diastolic CHF:  - 2-D echo in 8/16 showed EF of 65-70% with grade 2 diastolic dysfunction - Continue IV Lasix, negative balance of 4.8 L, check daily weight - Cardiology following     Essential hypertension - Currently stable, continue Coreg,  Diovan, Cardura Diltiazem d/c'd by cards    Hyperlipidemia - Continue statin    Severe OSA (obstructive sleep apnea), obesity hypoventilation, morbid obesity - Patient strongly counseled on diet and weight control - CPAP daily at bedtime    Uncontrolled diabetes mellitus - Blood sugars elevated, hemoglobin A1c 8.0 - Placed on sliding scale, added meal coverage 10units TID, Lantus 10units     Atrial fibrillation - Continue diltiazem, Coreg - Continue Pradaxa  Code Status:  full code  Family Communication: sister (nurse at [place]) and mother  Disposition Plan: Not medically ready  Time Spent in minutes   25 minutes  Procedure Chest x-ray  consults Cardiology  DVT Prophylaxis Lovenox   medications   Scheduled Meds: . allopurinol  300 mg Oral Daily  . atorvastatin  40 mg Oral q1800  . carvedilol  25 mg Oral BID WC  . dabigatran  150 mg Oral Q12H  . doxazosin  4 mg Oral Daily  . furosemide  60 mg Intravenous TID  . insulin aspart  0-15 Units Subcutaneous TID WC  . insulin aspart  0-5 Units Subcutaneous QHS  . insulin aspart  10 Units Subcutaneous TID WC  . insulin glargine  10 Units Subcutaneous Daily  . irbesartan  150 mg Oral Daily  . isosorbide mononitrate  30 mg Oral Daily  . pantoprazole  40 mg Oral Daily  . potassium chloride  40 mEq Oral Once  . sodium chloride  3 mL Intravenous Q12H  . tamsulosin  0.4  mg Oral QPC supper   Continuous Infusions:   PRN Meds:.sodium chloride, acetaminophen, levalbuterol, ondansetron (ZOFRAN) IV, sodium chloride   Antibiotics   Anti-infectives    None        Subjective:   Breathing better    Objective:   Blood pressure 141/53, pulse 60, temperature 98.4 F (36.9 C), temperature source Axillary, resp. rate 20, height  (1.651 m), weight 135.308 kg (298 lb 4.8 oz), SpO2 99 %.  Wt Readings from Last 3 Encounters:  08/16/15 135.308 kg (298 lb 4.8 oz)  07/26/15 127.007 kg (280 lb)  07/09/15 119.75 kg (264 lb)     Intake/Output Summary (Last 24 hours) at 08/16/15 1233 Last data filed at 08/16/15 0900  Gross per 24 hour  Intake    360 ml  Output   3625 ml  Net  -3265 ml    Exam  General: Alert and oriented x 3, NAD  CVS: S1 S2 clear, RRR  Resp: Diminished due to size, no wheezing  Abdomen :  morbidly obese Soft, nontender, distended, + bowel sounds  Ext: no cyanosis clubbing, 2+ pitting edema     Data Review   Micro Results No results found for this or any  previous visit (from the past 240 hour(s)).  Radiology Reports Dg Chest Portable 1 View  08/13/2015   CLINICAL DATA:  Shortness of breath. History of hypertension, diabetes and atrial fibrillation.  EXAM: PORTABLE CHEST - 1 VIEW  COMPARISON:  07/08/2015 and 06/25/2015.  FINDINGS: The heart appears mildly enlarged. There is vascular congestion with new perihilar and lower lobe airspace opacities most consistent with edema. There may be a small amount of pleural fluid bilaterally. No acute osseous findings are demonstrated. Telemetry leads overlie the chest.  IMPRESSION: New bilateral pulmonary opacities most consistent with edema and congestive heart failure. Possible small bilateral pleural effusions.   Electronically Signed   By: Carey Bullocks M.D.   On: 08/13/2015 19:58    CBC  Recent Labs Lab 08/13/15 1935  WBC 6.3  HGB 11.8*  HCT 36.3*  PLT 240  MCV 89.6  MCH 29.1  MCHC 32.5  RDW 14.5    Chemistries   Recent Labs Lab 08/13/15 1935 08/14/15 0208 08/15/15 0508 08/16/15 0356  NA 139 140 138 139  K 4.0 3.9 4.2 3.9  CL 112* 111 110 108  CO2 21* 21* 22 24  GLUCOSE 247* 126* 217* 102*  BUN 21* 20 21* 21*  CREATININE 1.07 1.04 1.14 1.07  CALCIUM 8.9 8.8* 8.7* 8.9   ------------------------------------------------------------------------------------------------------------------ estimated creatinine clearance is 88.6 mL/min (by C-G formula based on Cr of 1.07). ------------------------------------------------------------------------------------------------------------------  Recent Labs  08/14/15 1420  HGBA1C 8.7*   ------------------------------------------------------------------------------------------------------------------ No results for input(s): CHOL, HDL, LDLCALC, TRIG, CHOLHDL, LDLDIRECT in the last 72 hours. ------------------------------------------------------------------------------------------------------------------ No results for input(s): TSH,  T4TOTAL, T3FREE, THYROIDAB in the last 72 hours.  Invalid input(s): FREET3 ------------------------------------------------------------------------------------------------------------------ No results for input(s): VITAMINB12, FOLATE, FERRITIN, TIBC, IRON, RETICCTPCT in the last 72 hours.  Coagulation profile No results for input(s): INR, PROTIME in the last 168 hours.  No results for input(s): DDIMER in the last 72 hours.  Cardiac Enzymes No results for input(s): CKMB, TROPONINI, MYOGLOBIN in the last 168 hours.  Invalid input(s): CK ------------------------------------------------------------------------------------------------------------------ Invalid input(s): POCBNP   Recent Labs  08/15/15 0738 08/15/15 1128 08/15/15 1608 08/15/15 2047 08/16/15 0740 08/16/15 1141  GLUCAP 201* 239* 112* 82 129* 188*     VANN, JESSICA  DO. [facility] 08/16/2015, 12:33 PM  Pager: [phone] Between 7am to 7pm - call Pager - [phone]  After 7pm go to [email or web address] - password [email or web address]  Call night coverage person covering after 7pm

## 2015-08-16 NOTE — Care Management Note (Addendum)
Case Management Note  Patient Details  Name: Raymond Gordon MRN: [id number] Date of Birth: [birth year]  Subjective/Objective: Pt admitted for Acute CHF.                    Action/Plan: CM has benefits check in process for Pradaxa. Will make pt aware once completed. CM will assess for disposition needs as well.    Expected Discharge Date:                  Expected Discharge Plan:  Home w Home Health Services  In-House Referral:     Discharge planning Services  CM Consult  Post Acute Care Choice:    Choice offered to:     DME Arranged:    DME Agency:     HH Arranged:    HH Agency:     Status of Service:  In process, will continue to follow  Medicare Important Message Given:    Date Medicare IM Given:    Medicare IM give by:    Date Additional Medicare IM Given:    Additional Medicare Important Message give by:     If discussed at Long Length of Stay Meetings, dates discussed:    Additional Comments:   08-21-15 [address] Tomi Bamberger, RN,BSN [phone] Pt continues on IV lasix tid. Plan will be SNF once stable for d/c. No further needs from CM at this time.   1211 08-18-15 Tomi Bamberger, RN,BSN [phone] CSW spoke with pt in regards to SNF. Pt is agreeable to SNF at this time. No further needs from CM.    1041 08-16-15 Tomi Bamberger, RN, BSN [phone] CM did peak with pt in regards to Home. Pt lives with his mother and has support of other family members. Pt states he will not need HHRN at this time. CM did discuss need to weigh self daily. Pt has a scale at home. Per pt he has been compliant with medications. Per pt he ambulates good just needs to walk more npo DME at home. [facility] has Pradaxa available. 30 day free card provided to pt. Per pt's mother pt was previously on medication. No further needs from CM at this time.    Gala Lewandowsky, RN 08/16/2015, 8:54 AM

## 2015-08-16 NOTE — Care Management Important Message (Signed)
Important Message  Patient Details  Name: Raymond Gordon MRN: [id number] Date of Birth: 13-Mar-1951   Medicare Important Message Given:  Yes-second notification given    Bernadette Hoit 08/16/2015, 10:57 AM

## 2015-08-17 LAB — BASIC METABOLIC PANEL
ANION GAP: 10 (ref 5–15)
BUN: 18 mg/dL (ref 6–20)
CO2: 24 mmol/L (ref 22–32)
Calcium: 9.4 mg/dL (ref 8.9–10.3)
Chloride: 103 mmol/L (ref 101–111)
Creatinine, Ser: 0.95 mg/dL (ref 0.61–1.24)
GFR calc Af Amer: >60 mL/min (ref >60)
GFR calc non Af Amer: >60 mL/min (ref >60)
GLUCOSE: 103 mg/dL — AB (ref 65–99)
POTASSIUM: 3.8 mmol/L (ref 3.5–5.1)
Sodium: 137 mmol/L (ref 135–145)

## 2015-08-17 LAB — GLUCOSE, CAPILLARY
GLUCOSE-CAPILLARY: 143 mg/dL — AB (ref 65–99)
Glucose-Capillary: 160 mg/dL — ABNORMAL HIGH (ref 65–99)
Glucose-Capillary: 104 mg/dL — ABNORMAL HIGH (ref 65–99)
Glucose-Capillary: 112 mg/dL — ABNORMAL HIGH (ref 65–99)

## 2015-08-17 LAB — CBC
HEMATOCRIT: 35.5 % — AB (ref 39.0–52.0)
Hemoglobin: 11.5 g/dL — ABNORMAL LOW (ref 13.0–17.0)
MCH: 29.2 pg (ref 26.0–34.0)
MCHC: 32.4 g/dL (ref 30.0–36.0)
MCV: 90.1 fL (ref 78.0–100.0)
Platelets: 230 10*3/uL (ref 150–400)
RBC: 3.94 MIL/uL — ABNORMAL LOW (ref 4.22–5.81)
RDW: 14.4 % (ref 11.5–15.5)
WBC: 6.8 10*3/uL (ref 4.0–10.5)

## 2015-08-17 NOTE — Discharge Instructions (Signed)
Information on my medicine - Pradaxa (dabigatran)  This medication education was reviewed with me or my healthcare representative as part of my discharge preparation.  The pharmacist that spoke with me during my hospital stay was:  Almon Hercules, [facility]  Why was Pradaxa prescribed for you? Pradaxa was prescribed for you to reduce the risk of forming blood clots that cause a stroke if you have a medical condition called atrial fibrillation (a type of irregular heartbeat).    What do you Need to know about PradAXa? Take your Pradaxa TWICE DAILY - one capsule in the morning and one tablet in the evening with or without food.  It would be best to take the doses about the same time each day.  The capsules should not be broken, chewed or opened - they must be swallowed whole.  Do not store Pradaxa in other medication containers - once the bottle is opened the Pradaxa should be used within FOUR months; throw away any capsules that havent been by that time.  Take Pradaxa exactly as prescribed by your doctor.  DO NOT stop taking Pradaxa without talking to the doctor who prescribed the medication.  Stopping without other stroke prevention medication to take the place of Pradaxa may increase your risk of developing a clot that causes a stroke.  Refill your prescription before you run out.  After discharge, you should have regular check-up appointments with your healthcare provider that is prescribing your Pradaxa.  In the future your dose may need to be changed if your kidney function or weight changes by a significant amount.  What do you do if you miss a dose? If you miss a dose, take it as soon as you remember on the same day.  If your next dose is less than 6 hours away, skip the missed dose.  Do not take two doses of PRADAXA at the same time.  Important Safety Information A possible side effect of Pradaxa is bleeding. You should call your healthcare provider right away if you experience any  of the following: ? Bleeding from an injury or your nose that does not stop. ? Unusual colored urine (red or dark brown) or unusual colored stools (red or black). ? Unusual bruising for unknown reasons. ? A serious fall or if you hit your head (even if there is no bleeding).  Some medicines may interact with Pradaxa and might increase your risk of bleeding or clotting while on Pradaxa. To help avoid this, consult your healthcare provider or pharmacist prior to using any new prescription or non-prescription medications, including herbals, vitamins, non-steroidal anti-inflammatory drugs (NSAIDs) and supplements.  This website has more information on Pradaxa (dabigatran): [email or web address]://[email or web address].com

## 2015-08-17 NOTE — Progress Notes (Signed)
Placed patient on CPAP with 4L bled in. Patient is tolerating well and RT will continue to monitor.

## 2015-08-17 NOTE — Progress Notes (Signed)
Patient Name: Raymond Gordon Date of Encounter: 08/17/2015  Principal Problem:   Acute diastolic CHF (congestive heart failure) Active Problems:   Essential hypertension   Hyperlipidemia   Severe OSA (obstructive sleep apnea)   Acute respiratory failure with hypoxia   Uncontrolled diabetes mellitus   Atrial fibrillation   Primary Cardiologist: Dr Allyson Sabal  Patient Profile: 64yo male with a history of PAF, HTN, HLD, HTN, HLD, DM, remote tob, cath 2012 w/ mild dz, afib 2012 w/ spont conv SR, on Pradaxa. Had CP/afib 06/2015 spont conv SR & EF 65% w/ grade 2 diast dysf. Admitted 09/19 w/ SOB, CHF, urinary retention (swollen prostate).   SUBJECTIVE  Breathing improved, however still not at baseline. Denies CP or palpitation. Sitting in chair. Good diuresis on increased dose.   CURRENT MEDS . allopurinol  300 mg Oral Daily  . atorvastatin  40 mg Oral q1800  . carvedilol  25 mg Oral BID WC  . dabigatran  150 mg Oral Q12H  . doxazosin  4 mg Oral Daily  . furosemide  60 mg Intravenous TID  . insulin aspart  0-15 Units Subcutaneous TID WC  . insulin aspart  0-5 Units Subcutaneous QHS  . insulin aspart  10 Units Subcutaneous TID WC  . insulin glargine  10 Units Subcutaneous Daily  . irbesartan  150 mg Oral Daily  . isosorbide mononitrate  30 mg Oral Daily  . pantoprazole  40 mg Oral Daily  . sodium chloride  3 mL Intravenous Q12H  . tamsulosin  0.4 mg Oral QPC supper    OBJECTIVE  Filed Vitals:   08/16/15 1413 08/16/15 2011 08/16/15 2200 08/17/15 0432  BP: 137/41 133/56  130/35  Pulse: 62 58 60 81  Temp: 99.5 F (37.5 C) 98.5 F (36.9 C)  99.5 F (37.5 C)  TempSrc: Oral Oral  Oral  Resp: 16     Height:      Weight:    281 lb 9.6 oz (127.733 kg)  SpO2: 93% 100% 100% 94%    Intake/Output Summary (Last 24 hours) at 08/17/15 1009 Last data filed at 08/17/15 0439  Gross per 24 hour  Intake    740 ml  Output   5300 ml  Net  -4560 ml   Filed Weights   08/15/15 1243  08/16/15 0432 08/17/15 0432  Weight: 292 lb 1.8 oz (132.5 kg) 298 lb 4.8 oz (135.308 kg) 281 lb 9.6 oz (127.733 kg)    PHYSICAL EXAM  General: Pleasant, NAD. Sitting in chair.  Neuro: Alert and oriented X 3. Moves all extremities spontaneously. Psych: Normal affect. HEENT:  Normal  Neck: Supple without bruits. JVD  difficult to assess 2nd body habitus. Lungs:  Resp regular and unlabored. Diminished breath sound bibasilar.  Heart: RRR no s3, s4, or murmurs. Abdomen: Soft, non-tender, non-distended, BS + x 4.  Extremities: No clubbing, cyanosis. 1-2+ BL LE edema. DP/PT/Radials 2+ and equal bilaterally.  Accessory Clinical Findings  CBC  Recent Labs  08/17/15 0438  WBC 6.8  HGB 11.5*  HCT 35.5*  MCV 90.1  PLT 230   Basic Metabolic Panel  Recent Labs  08/16/15 0356 08/17/15 0438  NA 139 137  K 3.9 3.8  CL 108 103  CO2 24 24  GLUCOSE 102* 103*  BUN 21* 18  CREATININE 1.07 0.95  CALCIUM 8.9 9.4   Hemoglobin A1C  Recent Labs  08/14/15 1420  HGBA1C 8.7*    TELE  NSR  Radiology/Studies  Dg Chest Portable 1  View  08/13/2015   CLINICAL DATA:  Shortness of breath. History of hypertension, diabetes and atrial fibrillation.  EXAM: PORTABLE CHEST - 1 VIEW  COMPARISON:  07/08/2015 and 06/25/2015.  FINDINGS: The heart appears mildly enlarged. There is vascular congestion with new perihilar and lower lobe airspace opacities most consistent with edema. There may be a small amount of pleural fluid bilaterally. No acute osseous findings are demonstrated. Telemetry leads overlie the chest.  IMPRESSION: New bilateral pulmonary opacities most consistent with edema and congestive heart failure. Possible small bilateral pleural effusions.   Electronically Signed   By: Carey Bullocks M.D.   On: 08/13/2015 19:58    ASSESSMENT AND PLAN  1. Acute diastolic CHF (congestive heart failure) - Diuresed 9.3 L and weight down 9lb. Cr stable.  -Continue coreg and current dose of IV  lasix. Likely switch to po or reduce dose tomorrow. Discontinued cardizem as this may be contributing to his lower extremity edema.  2. Essential hypertension - Relatively stable. Low diastolic BP.   3. Hyperlipidemia - continue statin  4. Severe OSA (obstructive sleep apnea) - on CPAP at home and here   5. Acute respiratory failure with hypoxia - improve w/ diuresis. Sitting up in chair. Ambulation soon.   6. Uncontrolled diabetes mellitus - on home rx and SSI - A1c 8.7  7. Atrial fibrillation - maintaining SR  -Continue Pradaxa and coreg  8. Urinary retention - per primary  Dispo: adviced lifestyle and diatery modifications.   Signed, Bhagat,Bhavinkumar PA-C Pager [phone]  I have personally seen and examined this patient with B Bhagat, PA-C. I agree with the assessment and plan as outlined above. He is feeling slightly better today. SOB improved. Diuresing well with IV Lasix. Renal function stable. Still volume overloaded. 2-3+ LE edema. Continue IV Lasix today.   MCALHANY,CHRISTOPHER 08/17/2015 10:23 AM

## 2015-08-17 NOTE — Progress Notes (Signed)
[facility]                                                                              Patient Demographics  Raymond Gordon, is a 64 y.o. male, DOB - Apr 12, 1951, ZOX:[id number]  Admit date - 08/13/2015   Admitting Physician Ron Parker, MD  Outpatient Primary MD for the patient is Gaspar Garbe, MD  LOS - 4   Chief Complaint  Patient presents with  . Shortness of Breath       Brief HPI   Raymond Gordon is a 64 y.o. male with a history of Diastolic CHF, EF =81% on last 2D ECHO 06/2015, HTN, PAF on Pradxaa Rx, DM2 and OSA who presents to the ED with complaints of worsening SOB, DOE and orthopnea since last night. He also reported having increasing lower leg edema x 5 days. He denied Chest Pain. EMS was called and he was found to have hypoxia with O2 sats of 89% And he was placed on 4 liters NCO2. In the ED he continue to have respiratory distress and his O2 was increased to a NRB at 15 liters and then he was placed on BiPAP.   Assessment & Plan     Acute on chronic respiratory failure secondary to Acute on chronic diastolic CHF (congestive heart failure), obesity hypoventilation, severe obstructive sleep apnea Acute on chronic diastolic CHF:  - 2-D echo in 8/16 showed EF of 65-70% with grade 2 diastolic dysfunction - Continue IV Lasix, - Cardiology following     Essential hypertension - Currently stable, continue Coreg,  Diovan, Cardura Diltiazem d/c'd by cards    Hyperlipidemia - Continue statin    Severe OSA (obstructive sleep apnea), obesity hypoventilation, morbid obesity - Patient strongly counseled on diet and weight control - CPAP daily at bedtime    Uncontrolled diabetes mellitus - Blood sugars elevated, hemoglobin A1c 8.0 - Placed on sliding scale, added meal coverage 10units TID, Lantus 10units     Atrial fibrillation - Continue Pradaxa, carvedilol  Urinary retention: has had foley in x3 weeks. F/u urology as outpt  Code  Status: full code  Family Communication: patient  Disposition Plan: Not medically ready  Time Spent in minutes   20 min  Procedure Chest x-ray  consults Cardiology  DVT Prophylaxis Lovenox   medications   Scheduled Meds: . allopurinol  300 mg Oral Daily  . atorvastatin  40 mg Oral q1800  . carvedilol  25 mg Oral BID WC  . dabigatran  150 mg Oral Q12H  . doxazosin  4 mg Oral Daily  . furosemide  60 mg Intravenous TID  . insulin aspart  0-15 Units Subcutaneous TID WC  . insulin aspart  0-5 Units Subcutaneous QHS  . insulin aspart  10 Units Subcutaneous TID WC  . insulin glargine  10 Units Subcutaneous Daily  . irbesartan  150 mg Oral Daily  . isosorbide mononitrate  30 mg Oral Daily  . pantoprazole  40 mg Oral Daily  . sodium chloride  3 mL Intravenous Q12H  . tamsulosin  0.4 mg Oral QPC supper   Continuous Infusions:   PRN Meds:.sodium chloride,  acetaminophen, levalbuterol, ondansetron (ZOFRAN) IV, sodium chloride   Antibiotics   Anti-infectives    None        Subjective:   No dyspnea. Still with some leg edema.    Objective:   Blood pressure 130/35, pulse 81, temperature 99.5 F (37.5 C), temperature source Oral, resp. rate 16, height  (1.651 m), weight 127.733 kg (281 lb 9.6 oz), SpO2 94 %.  Wt Readings from Last 3 Encounters:  08/17/15 127.733 kg (281 lb 9.6 oz)  07/26/15 127.007 kg (280 lb)  07/09/15 119.75 kg (264 lb)     Intake/Output Summary (Last 24 hours) at 08/17/15 1324 Last data filed at 08/17/15 0439  Gross per 24 hour  Intake    480 ml  Output   5300 ml  Net  -4820 ml    Exam  General: Alert and oriented x 3, NAD  CVS: S1 S2 clear, RRR  Resp: Diminished due to size, no wheezing  Abdomen :  morbidly obese Soft, nontender, distended, + bowel sounds  Ext: no cyanosis clubbing, 2+ pitting edema. Hyperpigmentation    Data Review   Micro Results No results found for this or any previous visit (from the past 240  hour(s)).  Radiology Reports Dg Chest Portable 1 View  08/13/2015   CLINICAL DATA:  Shortness of breath. History of hypertension, diabetes and atrial fibrillation.  EXAM: PORTABLE CHEST - 1 VIEW  COMPARISON:  07/08/2015 and 06/25/2015.  FINDINGS: The heart appears mildly enlarged. There is vascular congestion with new perihilar and lower lobe airspace opacities most consistent with edema. There may be a small amount of pleural fluid bilaterally. No acute osseous findings are demonstrated. Telemetry leads overlie the chest.  IMPRESSION: New bilateral pulmonary opacities most consistent with edema and congestive heart failure. Possible small bilateral pleural effusions.   Electronically Signed   By: Carey Bullocks M.D.   On: 08/13/2015 19:58    CBC  Recent Labs Lab 08/13/15 1935 08/17/15 0438  WBC 6.3 6.8  HGB 11.8* 11.5*  HCT 36.3* 35.5*  PLT 240 230  MCV 89.6 90.1  MCH 29.1 29.2  MCHC 32.5 32.4  RDW 14.5 14.4    Chemistries   Recent Labs Lab 08/13/15 1935 08/14/15 0208 08/15/15 0508 08/16/15 0356 08/17/15 0438  NA 139 140 138 139 137  K 4.0 3.9 4.2 3.9 3.8  CL 112* 111 110 108 103  CO2 21* 21* GLUCOSE 247* 126* 217* 102* 103*  BUN 21* 20 21* 21* 18  CREATININE 1.07 1.04 1.14 1.07 0.95  CALCIUM 8.9 8.8* 8.7* 8.9 9.4   ------------------------------------------------------------------------------------------------------------------ estimated creatinine clearance is 96.5 mL/min (by C-G formula based on Cr of 0.95). ------------------------------------------------------------------------------------------------------------------  Recent Labs  08/14/15 1420  HGBA1C 8.7*   ------------------------------------------------------------------------------------------------------------------ No results for input(s): CHOL, HDL, LDLCALC, TRIG, CHOLHDL, LDLDIRECT in the last 72  hours. ------------------------------------------------------------------------------------------------------------------ No results for input(s): TSH, T4TOTAL, T3FREE, THYROIDAB in the last 72 hours.  Invalid input(s): FREET3 ------------------------------------------------------------------------------------------------------------------ No results for input(s): VITAMINB12, FOLATE, FERRITIN, TIBC, IRON, RETICCTPCT in the last 72 hours.  Coagulation profile No results for input(s): INR, PROTIME in the last 168 hours.  No results for input(s): DDIMER in the last 72 hours.  Cardiac Enzymes No results for input(s): CKMB, TROPONINI, MYOGLOBIN in the last 168 hours.  Invalid input(s): CK ------------------------------------------------------------------------------------------------------------------ Invalid input(s): POCBNP   Recent Labs  08/16/15 0740 08/16/15 1141 08/16/15 1633 08/16/15 2012 08/17/15 0756 08/17/15 1145  GLUCAP 129* 188* 128* 95 143* 160*  Christiane Ha  MD. [facility] 08/17/2015, 1:24 PM [email or web address] - password [facility]

## 2015-08-18 LAB — BASIC METABOLIC PANEL
Anion gap: 10 (ref 5–15)
BUN: 17 mg/dL (ref 6–20)
CHLORIDE: 97 mmol/L — AB (ref 101–111)
CO2: 31 mmol/L (ref 22–32)
CREATININE: 0.97 mg/dL (ref 0.61–1.24)
Calcium: 9.5 mg/dL (ref 8.9–10.3)
GFR calc Af Amer: >60 mL/min (ref >60)
GFR calc non Af Amer: >60 mL/min (ref >60)
GLUCOSE: 205 mg/dL — AB (ref 65–99)
Potassium: 3.8 mmol/L (ref 3.5–5.1)
Sodium: 138 mmol/L (ref 135–145)

## 2015-08-18 LAB — GLUCOSE, CAPILLARY
Glucose-Capillary: 119 mg/dL — ABNORMAL HIGH (ref 65–99)
Glucose-Capillary: 164 mg/dL — ABNORMAL HIGH (ref 65–99)
Glucose-Capillary: 146 mg/dL — ABNORMAL HIGH (ref 65–99)
Glucose-Capillary: 106 mg/dL — ABNORMAL HIGH (ref 65–99)

## 2015-08-18 LAB — URINALYSIS, ROUTINE W REFLEX MICROSCOPIC
Bilirubin Urine: NEGATIVE
Glucose, UA: NEGATIVE mg/dL
KETONES UR: NEGATIVE mg/dL
NITRITE: POSITIVE — AB
PROTEIN: NEGATIVE mg/dL
Specific Gravity, Urine: 1.013 (ref 1.005–1.030)
Urobilinogen, UA: 1 mg/dL (ref 0.0–1.0)
pH: 5 (ref 5.0–8.0)

## 2015-08-18 LAB — URINE MICROSCOPIC-ADD ON

## 2015-08-18 NOTE — Progress Notes (Signed)
CSW (Clinical Child psychotherapist) provided bed offers to pt and pt family. They have accepted bed at [facility]. CSW confirmed that facility is able to accept pt over the weekend if needed. CSW will need to provide facility with CPAP settings. Pt nurse aware and working on getting settings documented in progress note.  Poonum Ambelal, LCSW [phone]

## 2015-08-18 NOTE — Progress Notes (Addendum)
Patient Name: Raymond Gordon Date of Encounter: 08/18/2015  Principal Problem:   Acute diastolic CHF (congestive heart failure) Active Problems:   Essential hypertension   Hyperlipidemia   Severe OSA (obstructive sleep apnea)   Acute respiratory failure with hypoxia   Uncontrolled diabetes mellitus   Atrial fibrillation   Primary Cardiologist: Dr Allyson Sabal  Patient Profile: 64yo male with a history of PAF, HTN, HLD, HTN, HLD, DM, remote tob, cath 2012 w/ mild dz, afib 2012 w/ spont conv SR, on Pradaxa. Had CP/afib 06/2015 spont conv SR & EF 65% w/ grade 2 diast dysf. Admitted 09/19 w/ SOB, CHF, urinary retention (swollen prostate).   SUBJECTIVE  Breathing improved. Feels better. Denies CP or palpitation. Sitting in chair. Good diuresis on increased dose.   CURRENT MEDS . allopurinol  300 mg Oral Daily  . atorvastatin  40 mg Oral q1800  . carvedilol  25 mg Oral BID WC  . dabigatran  150 mg Oral Q12H  . doxazosin  4 mg Oral Daily  . furosemide  60 mg Intravenous TID  . insulin aspart  0-15 Units Subcutaneous TID WC  . insulin aspart  0-5 Units Subcutaneous QHS  . insulin aspart  10 Units Subcutaneous TID WC  . insulin glargine  10 Units Subcutaneous Daily  . irbesartan  150 mg Oral Daily  . isosorbide mononitrate  30 mg Oral Daily  . pantoprazole  40 mg Oral Daily  . sodium chloride  3 mL Intravenous Q12H  . tamsulosin  0.4 mg Oral QPC supper    OBJECTIVE  Filed Vitals:   08/17/15 1500 08/17/15 2000 08/18/15 0000 08/18/15 0400  BP: 151/63 138/53 139/49 132/48  Pulse: 76 69 72 78  Temp: 98.4 F (36.9 C) 99.7 F (37.6 C) 99.5 F (37.5 C) 98.7 F (37.1 C)  TempSrc: Oral Oral Oral Oral  Resp: Height:      Weight:    269 lb (122.018 kg)  SpO2: 100% 100% 98% 96%    Intake/Output Summary (Last 24 hours) at 08/18/15 0826 Last data filed at 08/18/15 0540  Gross per 24 hour  Intake    600 ml  Output   6200 ml  Net  -5600 ml   Filed Weights   08/16/15  0432 08/17/15 0432 08/18/15 0400  Weight: 298 lb 4.8 oz (135.308 kg) 281 lb 9.6 oz (127.733 kg) 269 lb (122.018 kg)    PHYSICAL EXAM  General: Pleasant, NAD. Sitting in chair.  Neuro: Alert and oriented X 3. Moves all extremities spontaneously. Psych: Normal affect. HEENT:  Normal  Neck: Supple without bruits. JVD  difficult to assess 2nd body habitus. Lungs:  Resp regular and unlabored. Diminished breath sound bibasilar R>L.  Heart: RRR no s3, s4, or murmurs. Abdomen: Soft, non-tender, non-distended, BS + x 4.  Extremities: No clubbing, cyanosis. 1-2+ BL LE edema. DP/PT/Radials 2+ and equal bilaterally.  Accessory Clinical Findings  CBC  Recent Labs  08/17/15 0438  WBC 6.8  HGB 11.5*  HCT 35.5*  MCV 90.1  PLT 230   Basic Metabolic Panel  Recent Labs  08/16/15 0356 08/17/15 0438  NA 139 137  K 3.9 3.8  CL 108 103  CO2 24 24  GLUCOSE 102* 103*  BUN 21* 18  CREATININE 1.07 0.95  CALCIUM 8.9 9.4   Hemoglobin A1C No results for input(s): HGBA1C in the last 72 hours.  TELE  NSR with ventricular bigeminy and PVCs  Radiology/Studies  Dg Chest Portable  1 View  08/13/2015   CLINICAL DATA:  Shortness of breath. History of hypertension, diabetes and atrial fibrillation.  EXAM: PORTABLE CHEST - 1 VIEW  COMPARISON:  07/08/2015 and 06/25/2015.  FINDINGS: The heart appears mildly enlarged. There is vascular congestion with new perihilar and lower lobe airspace opacities most consistent with edema. There may be a small amount of pleural fluid bilaterally. No acute osseous findings are demonstrated. Telemetry leads overlie the chest.  IMPRESSION: New bilateral pulmonary opacities most consistent with edema and congestive heart failure. Possible small bilateral pleural effusions.   Electronically Signed   By: Carey Bullocks M.D.   On: 08/13/2015 19:58    ASSESSMENT AND PLAN  1. Acute diastolic CHF (congestive heart failure) - Diuresed 5.6 L/14.9L and weight down 12/29lb. No  BMET has done this morning. Will get one to assess kidney function.  -Continue coreg and current dose of IV lasix. Likely switch to po or reduce dose tomorrow. Discontinued cardizem as this may be contributing to his lower extremity edema. Ambulation soon, likely PT tomorrow.   2. Essential hypertension - Relatively stable. Low diastolic BP.   3. Hyperlipidemia - continue statin  4. Severe OSA (obstructive sleep apnea) - on CPAP at home and here   5. Acute respiratory failure with hypoxia - improve w/ diuresis. Sitting up in chair. Ambulation soon.   6. Uncontrolled diabetes mellitus - on home rx and SSI - A1c 8.7  7. Atrial fibrillation - maintaining SR  -Continue Pradaxa and coreg  8. Urinary retention and UTI - per primary.  Dispo: adviced lifestyle and diatery modifications.   Signed, Bhagat,Bhavinkumar PA-C Pager [phone]   I have personally seen and examined this patient with B. Bhagat, PA-C. I agree with the assessment and plan as outlined above. He is still diuresing well. Renal function has been stable. Will continue diuresis with IV Lasix as he is still volume overloaded. Exam with bilateral LE edema, clear lungs, RRR.   MCALHANY,CHRISTOPHER 08/18/2015'10:57 AM

## 2015-08-18 NOTE — Progress Notes (Signed)
Patient placed on CPAP of 10. O2 bleed in of 4L. Patient tolerating well sat 96%. RT will continue to monitor as needed.  

## 2015-08-18 NOTE — Evaluation (Signed)
Physical Therapy Evaluation Patient Details Name: Raymond Gordon MRN: [id number] DOB: 1951-08-01 Today's Date: 08/18/2015   History of Present Illness  Pt is a 64 y.o. male admitted 08/13/15 for SOB, DOE and CHF. PMH consists of a fib with  RVR, DM, PAF, HTN, CHF and BLE edema.  Clinical Impression  Pt admitted with above diagnosis. Pt currently with functional limitations due to the deficits listed below (see PT Problem List). On eval, pt required mod assist for sit to stand and min assist ambulation with RW 3 feet. Gait distance limited by pain bilat ankles. Edema present BLE distally. Pt positioned in recliner with bilat feet elevated. During mobility, O2 sats 92% on 4 L O2. Pt will benefit from skilled PT to increase their independence and safety with mobility to allow discharge to the venue listed below.       Follow Up Recommendations SNF;Supervision/Assistance - 24 hour    Equipment Recommendations  None recommended by PT    Recommendations for Other Services       Precautions / Restrictions Precautions Precautions: Fall;Other (comment) Precaution Comments: monitor O2      Mobility  Bed Mobility               General bed mobility comments: Pt received in recliner.  Transfers Overall transfer level: Needs assistance Equipment used: Rolling walker (2 wheeled) Transfers: Sit to/from Stand Sit to Stand: Mod assist         General transfer comment: verbal cues for hand placement, assist for anterior weight shift  Ambulation/Gait Ambulation/Gait assistance: Min assist Ambulation Distance (Feet): 3 Feet Assistive device: Rolling walker (2 wheeled) Gait Pattern/deviations: Step-through pattern;Decreased stride length Gait velocity: very slow Gait velocity interpretation: Below normal speed for age/gender General Gait Details: Pt reporting inability to ambulate due to bilat ankle pain. Pt was sitting in recliner with feet in dependent position upon PT  arrival.  Stairs            Wheelchair Mobility    Modified Rankin (Stroke Patients Only)       Balance                                             Pertinent Vitals/Pain Pain Assessment: 0-10 Pain Score: 5  Pain Location: bilat ankles Pain Descriptors / Indicators: Sore Pain Intervention(s): Monitored during session;Limited activity within patient's tolerance    Home Living Family/patient expects to be discharged to:: Private residence Living Arrangements: Spouse/significant other Available Help at Discharge: Family;Available 24 hours/day Type of Home: House Home Access: Stairs to enter Entrance Stairs-Rails: Right;Can reach Technical sales engineer of Steps: 4 Home Layout: One level Home Equipment: Walker - 2 wheels;Cane - single point;Bedside commode;Shower seat;Tub bench      Prior Function Level of Independence: Independent               Hand Dominance   Dominant Hand: Right    Extremity/Trunk Assessment   Upper Extremity Assessment: Generalized weakness           Lower Extremity Assessment: Generalized weakness      Cervical / Trunk Assessment: Normal  Communication   Communication: No difficulties  Cognition Arousal/Alertness: Awake/alert Behavior During Therapy: WFL for tasks assessed/performed Overall Cognitive Status: Within Functional Limits for tasks assessed  General Comments      Exercises Total Joint Exercises Ankle Circles/Pumps: AROM;Both;10 reps      Assessment/Plan    PT Assessment Patient needs continued PT services  PT Diagnosis Difficulty walking;Generalized weakness   PT Problem List Decreased strength;Decreased activity tolerance;Decreased balance;Decreased mobility;Cardiopulmonary status limiting activity;Pain;Decreased safety awareness;Decreased knowledge of use of DME  PT Treatment Interventions DME instruction;Gait training;Stair training;Functional  mobility training;Therapeutic activities;Therapeutic exercise;Patient/family education;Balance training   PT Goals (Current goals can be found in the Care Plan section) Acute Rehab PT Goals Patient Stated Goal: get better PT Goal Formulation: With patient/family Time For Goal Achievement: 09/01/15 Potential to Achieve Goals: Good    Frequency Min 3X/week   Barriers to discharge        Co-evaluation               End of Session Equipment Utilized During Treatment: Gait belt;Oxygen Activity Tolerance: Patient limited by pain;Patient limited by fatigue Patient left: in chair;with family/visitor present;with call bell/phone within reach Nurse Communication: Mobility status         Time: 0981-1914 PT Time Calculation (min) (ACUTE ONLY): 25 min   Charges:   PT Evaluation $Initial PT Evaluation Tier I: 1 Procedure PT Treatments $Therapeutic Activity: 8-22 mins   PT G Codes:        Ilda Foil 08/18/2015, 9:22 AM

## 2015-08-18 NOTE — Care Management Important Message (Signed)
Important Message  Patient Details  Name: Raymond Gordon MRN: [id number] Date of Birth: October 31, 1951   Medicare Important Message Given:  Yes-third notification given    Bernadette Hoit 08/18/2015, 10:50 AM

## 2015-08-18 NOTE — Clinical Social Work Placement (Signed)
   CLINICAL SOCIAL WORK PLACEMENT  NOTE  Date:  08/18/2015  Patient Details  Name: Raymond Gordon MRN: [id number] Date of Birth: [birth year]  Clinical Social Work is seeking post-discharge placement for this patient at the Skilled  Nursing Facility level of care (*CSW will initial, date and re-position this form in  chart as items are completed):  Yes   Patient/family provided with [facility] Clinical Social Work Department's list of facilities offering this level of care within the geographic area requested by the patient (or if unable, by the patient's family).  Yes   Patient/family informed of their freedom to choose among providers that offer the needed level of care, that participate in Medicare, Medicaid or managed care program needed by the patient, have an available bed and are willing to accept the patient.  Yes   Patient/family informed of [facility]'s ownership interest in [facility] and [facility], as well as of the fact that they are under no obligation to receive care at these facilities.  PASRR submitted to EDS on 08/18/15     PASRR number received on 08/18/15     Existing PASRR number confirmed on       FL2 transmitted to all facilities in geographic area requested by pt/family on 08/18/15     FL2 transmitted to all facilities within larger geographic area on       Patient informed that his/her managed care company has contracts with or will negotiate with certain facilities, including the following:            Patient/family informed of bed offers received.  Patient chooses bed at       Physician recommends and patient chooses bed at      Patient to be transferred to   on  .  Patient to be transferred to facility by       Patient family notified on   of transfer.  Name of family member notified:        PHYSICIAN Please sign FL2     Additional Comment:    Poonum Ambelal, LCSW [phone]

## 2015-08-18 NOTE — Progress Notes (Signed)
[facility]                                                                              Patient Demographics  Raymond Gordon, is a 64 y.o. male, DOB - 11/29/1950, WUJ:[id number]  Admit date - 08/13/2015   Admitting Physician Ron Parker, MD  Outpatient Primary MD for the patient is Gaspar Garbe, MD  LOS - 5   Chief Complaint  Patient presents with  . Shortness of Breath       Brief HPI   Raymond Gordon is a 64 y.o. male with a history of Diastolic CHF, EF =95% on last 2D ECHO 06/2015, HTN, PAF on Pradxaa Rx, DM2 and OSA who presents to the ED with complaints of worsening SOB, DOE and orthopnea since last night. He also reported having increasing lower leg edema x 5 days. He denied Chest Pain. EMS was called and he was found to have hypoxia with O2 sats of 89% And he was placed on 4 liters NCO2. In the ED he continue to have respiratory distress and his O2 was increased to a NRB at 15 liters and then he was placed on BiPAP.   Assessment & Plan     Acute on chronic respiratory failure secondary to Acute on chronic diastolic CHF (congestive heart failure), obesity hypoventilation, severe obstructive sleep apnea Acute on chronic diastolic CHF:  - 2-D echo in 8/16 showed EF of 65-70% with grade 2 diastolic dysfunction - Continue IV Lasix, - Cardiology following Discussed low salt diet with patient and family    Essential hypertension - Currently stable, continue Coreg,  Diovan, Cardura Diltiazem d/c'd by cards    Hyperlipidemia - Continue statin    Severe OSA (obstructive sleep apnea), obesity hypoventilation, morbid obesity - Patient strongly counseled on diet and weight control - CPAP daily at bedtime    Uncontrolled diabetes mellitus - Blood sugars elevated, hemoglobin A1c 8.0 - Placed on sliding scale, added meal coverage 10units TID, Lantus 10units     Atrial fibrillation - Continue Pradaxa, carvedilol  Urinary retention: has had  foley in x3 weeks. F/u urology as outpt  Deconditioning: Physical therapy recommending skilled nursing facility. Patient and family agreeable. Will consult social work.  Code Status: full code  Family Communication: patient  Disposition Plan: to SNF when stable off iv lasix  Time Spent in minutes   20 min  Procedure Chest x-ray  consults Cardiology  DVT Prophylaxis Lovenox   medications   Scheduled Meds: . allopurinol  300 mg Oral Daily  . atorvastatin  40 mg Oral q1800  . carvedilol  25 mg Oral BID WC  . dabigatran  150 mg Oral Q12H  . doxazosin  4 mg Oral Daily  . furosemide  60 mg Intravenous TID  . insulin aspart  0-15 Units Subcutaneous TID WC  . insulin aspart  0-5 Units Subcutaneous QHS  . insulin aspart  10 Units Subcutaneous TID WC  . insulin glargine  10 Units Subcutaneous Daily  . irbesartan  150 mg Oral Daily  . isosorbide mononitrate  30 mg Oral Daily  . pantoprazole  40 mg Oral Daily  .  sodium chloride  3 mL Intravenous Q12H  . tamsulosin  0.4 mg Oral QPC supper   Continuous Infusions:   PRN Meds:.sodium chloride, acetaminophen, levalbuterol, ondansetron (ZOFRAN) IV, sodium chloride   Antibiotics   Anti-infectives    None        Subjective:   No dyspnea. Leg swelling improving. Agreeable to skilled nursing facility.    Objective:   Blood pressure 132/48, pulse 78, temperature 98.7 F (37.1 C), temperature source Oral, resp. rate 18, height  (1.651 m), weight 122.018 kg (269 lb), SpO2 96 %.  Wt Readings from Last 3 Encounters:  08/18/15 122.018 kg (269 lb)  07/26/15 127.007 kg (280 lb)  07/09/15 119.75 kg (264 lb)     Intake/Output Summary (Last 24 hours) at 08/18/15 1041 Last data filed at 08/18/15 0540  Gross per 24 hour  Intake    360 ml  Output   4900 ml  Net  -4540 ml    Exam  General: Alert and oriented x 3, NAD  CVS: S1 S2 clear, RRR  Resp: Diminished due to size, no wheezing  Abdomen :  morbidly obese  Soft, nontender, distended, + bowel sounds  Ext: no cyanosis clubbing, 1+ pitting edema.     Data Review   Micro Results No results found for this or any previous visit (from the past 240 hour(s)).  Radiology Reports Dg Chest Portable 1 View  08/13/2015   CLINICAL DATA:  Shortness of breath. History of hypertension, diabetes and atrial fibrillation.  EXAM: PORTABLE CHEST - 1 VIEW  COMPARISON:  07/08/2015 and 06/25/2015.  FINDINGS: The heart appears mildly enlarged. There is vascular congestion with new perihilar and lower lobe airspace opacities most consistent with edema. There may be a small amount of pleural fluid bilaterally. No acute osseous findings are demonstrated. Telemetry leads overlie the chest.  IMPRESSION: New bilateral pulmonary opacities most consistent with edema and congestive heart failure. Possible small bilateral pleural effusions.   Electronically Signed   By: Carey Bullocks M.D.   On: 08/13/2015 19:58    CBC  Recent Labs Lab 08/13/15 1935 08/17/15 0438  WBC 6.3 6.8  HGB 11.8* 11.5*  HCT 36.3* 35.5*  PLT 240 230  MCV 89.6 90.1  MCH 29.1 29.2  MCHC 32.5 32.4  RDW 14.5 14.4    Chemistries   Recent Labs Lab 08/14/15 0208 08/15/15 0508 08/16/15 0356 08/17/15 0438 08/18/15 0909  NA 140 138 139 137 138  K 3.9 4.2 3.9 3.8 3.8  CL 111 110 108 103 97*  CO2 21* GLUCOSE 126* 217* 102* 103* 205*  BUN 20 21* 21* 18 17  CREATININE 1.04 1.14 1.07 0.95 0.97  CALCIUM 8.8* 8.7* 8.9 9.4 9.5   ------------------------------------------------------------------------------------------------------------------ estimated creatinine clearance is 92 mL/min (by C-G formula based on Cr of 0.97). ------------------------------------------------------------------------------------------------------------------ No results for input(s): HGBA1C in the last 72  hours. ------------------------------------------------------------------------------------------------------------------ No results for input(s): CHOL, HDL, LDLCALC, TRIG, CHOLHDL, LDLDIRECT in the last 72 hours. ------------------------------------------------------------------------------------------------------------------ No results for input(s): TSH, T4TOTAL, T3FREE, THYROIDAB in the last 72 hours.  Invalid input(s): FREET3 ------------------------------------------------------------------------------------------------------------------ No results for input(s): VITAMINB12, FOLATE, FERRITIN, TIBC, IRON, RETICCTPCT in the last 72 hours.  Coagulation profile No results for input(s): INR, PROTIME in the last 168 hours.  No results for input(s): DDIMER in the last 72 hours.  Cardiac Enzymes No results for input(s): CKMB, TROPONINI, MYOGLOBIN in the last 168 hours.  Invalid input(s): CK ------------------------------------------------------------------------------------------------------------------ Invalid input(s): POCBNP   Recent  Labs  08/16/15 2012 08/17/15 0756 08/17/15 1145 08/17/15 1706 08/17/15 2102 08/18/15 0741  GLUCAP 95 143* 160* 104* 112* 119*     SULLIVAN,CORINNA L  MD. [facility] 08/18/2015, 10:41 AM [email or web address] - password [facility]

## 2015-08-18 NOTE — Clinical Social Work Note (Signed)
Clinical Social Work Assessment  Patient Details  Name: Raymond Gordon MRN: [id number] Date of Birth: 1951/01/02  Date of referral:  08/18/15               Reason for consult:  Facility Placement                Permission sought to share information with:  Facility Industrial/product designer granted to share information::  Yes, Verbal Permission Granted  Name::        Agency::  SNFs for referral purposes  Relationship::     Contact Information:     Housing/Transportation Living arrangements for the past 2 months:  Single Family Home Source of Information:  Patient Patient Interpreter Needed:  None Criminal Activity/Legal Involvement Pertinent to Current Situation/Hospitalization:  No - Comment as needed Significant Relationships:  Siblings, Parents Lives with:  Parents Do you feel safe going back to the place where you live?  Yes Need for family participation in patient care:  No (Coment)  Care giving concerns:  Pt requiring increased assistance with mobility   Office manager / plan:  CSW visited pt room and spoke with pt and family at bedside. CSW explained SNF recommendation and pt confirmed he was aware and is agreeable. CSW explained SNF referral process to pt and pt family. They are agreeable to referral being sent to all [facility]. Pt family expressed potential preference for [facility] but are interested in hearing all bed offers.  Employment status:    Insurance information:  Medicare PT Recommendations:  Skilled Nursing Facility Information / Referral to community resources:  Skilled Nursing Facility  Patient/Family's Response to care: Pt is agreeable to plan of care  Patient/Family's Understanding of and Emotional Response to Diagnosis, Current Treatment, and Prognosis:  Pt has limited insight on his condition but has been educated by MD this visit especially on diet.   Emotional Assessment Appearance:  Appears stated age,  Well-Groomed Attitude/Demeanor/Rapport:  Other (Cooperative) Affect (typically observed):  Calm, Pleasant Orientation:  Oriented to Self, Oriented to Place, Oriented to  Time, Oriented to Situation Alcohol / Substance use:  Not Applicable Psych involvement (Current and /or in the community):  No (Comment)  Discharge Needs  Concerns to be addressed:  Discharge Planning Concerns Readmission within the last 30 days:  No Current discharge risk:  Dependent with Mobility Barriers to Discharge:  Continued Medical Work up   [employer], LCSW [phone]

## 2015-08-19 DIAGNOSIS — R8271 Bacteriuria: Secondary | ICD-10-CM | POA: Clinically undetermined

## 2015-08-19 LAB — BASIC METABOLIC PANEL
ANION GAP: 8 (ref 5–15)
BUN: 21 mg/dL — ABNORMAL HIGH (ref 6–20)
CO2: 33 mmol/L — ABNORMAL HIGH (ref 22–32)
Calcium: 8.9 mg/dL (ref 8.9–10.3)
Chloride: 94 mmol/L — ABNORMAL LOW (ref 101–111)
Creatinine, Ser: 1 mg/dL (ref 0.61–1.24)
GLUCOSE: 148 mg/dL — AB (ref 65–99)
POTASSIUM: 3.4 mmol/L — AB (ref 3.5–5.1)
Sodium: 135 mmol/L (ref 135–145)

## 2015-08-19 LAB — GLUCOSE, CAPILLARY
GLUCOSE-CAPILLARY: 132 mg/dL — AB (ref 65–99)
GLUCOSE-CAPILLARY: 269 mg/dL — AB (ref 65–99)
Glucose-Capillary: 106 mg/dL — ABNORMAL HIGH (ref 65–99)
Glucose-Capillary: 76 mg/dL (ref 65–99)

## 2015-08-19 MED ORDER — POTASSIUM CHLORIDE CRYS ER 20 MEQ PO TBCR
40.0000 meq | EXTENDED_RELEASE_TABLET | Freq: Once | ORAL | Status: AC
  Administered 2015-08-19: 40 meq via ORAL
  Filled 2015-08-19: qty 2

## 2015-08-19 NOTE — Progress Notes (Signed)
[facility]                                                                              Patient Demographics  Raymond Gordon, is a 64 y.o. male, DOB - [birth year], WUJ:[id number]  Admit date - 08/13/2015   Admitting Physician Ron Parker, MD  Outpatient Primary MD for the patient is Gaspar Garbe, MD  LOS - 6   Chief Complaint  Patient presents with  . Shortness of Breath       Brief HPI   Raymond Gordon is a 64 y.o. male with a history of Diastolic CHF, EF =95% on last 2D ECHO 06/2015, HTN, PAF on Pradxaa Rx, DM2 and OSA who presents to the ED with complaints of worsening SOB, DOE and orthopnea since last night. He also reported having increasing lower leg edema x 5 days. He denied Chest Pain. EMS was called and he was found to have hypoxia with O2 sats of 89% And he was placed on 4 liters NCO2. In the ED he continue to have respiratory distress and his O2 was increased to a NRB at 15 liters and then he was placed on BiPAP.   Assessment & Plan     Acute on chronic respiratory failure secondary to Acute on chronic diastolic CHF (congestive heart failure), obesity hypoventilation, severe obstructive sleep apnea Acute on chronic diastolic CHF:  - 2-D echo in 8/16 showed EF of 65-70% with grade 2 diastolic dysfunction - Continue IV Lasix, - Cardiology following  Bacteruria: asymptomatic. Will check urine culture, hold off on antibiotics unless develops symptoms.    Essential hypertension - Currently stable, continue Coreg,  Diovan, Cardura Diltiazem d/c'd by cards    Hyperlipidemia - Continue statin    Severe OSA (obstructive sleep apnea), obesity hypoventilation, morbid obesity - Patient strongly counseled on diet and weight control - CPAP daily at bedtime    Uncontrolled diabetes mellitus - Blood sugars elevated, hemoglobin A1c 8.0 - Placed on sliding scale, added meal coverage 10units TID, Lantus 10units     Atrial fibrillation -  Continue Pradaxa, carvedilol  Urinary retention: has had foley PTA. F/u Dr. Brunilda Payor as outpt. Was treated for prostatitis a few months ago, and required voiding trial, then foley replaced per sister who is a Engineer, civil (consulting) at [employer] [facility] [employer]. Per rn, catheter obstructed this am, relieved with flush  Deconditioning: Physical therapy recommending skilled nursing facility. Patient and family agreeable.  social work following  Code Status: full code  Family Communication: sister and mother at bedside  Disposition Plan: to SNF when stable off iv lasix  Time Spent in minutes   20 min  Procedure Chest x-ray  consults Cardiology  DVT Prophylaxis Lovenox   medications   Scheduled Meds: . allopurinol  300 mg Oral Daily  . atorvastatin  40 mg Oral q1800  . carvedilol  25 mg Oral BID WC  . dabigatran  150 mg Oral Q12H  . doxazosin  4 mg Oral Daily  . furosemide  60 mg Intravenous TID  . insulin aspart  0-15 Units Subcutaneous TID WC  . insulin aspart  0-5 Units Subcutaneous QHS  . insulin  aspart  10 Units Subcutaneous TID WC  . insulin glargine  10 Units Subcutaneous Daily  . irbesartan  150 mg Oral Daily  . isosorbide mononitrate  30 mg Oral Daily  . pantoprazole  40 mg Oral Daily  . sodium chloride  3 mL Intravenous Q12H  . tamsulosin  0.4 mg Oral QPC supper   Continuous Infusions:   PRN Meds:.sodium chloride, acetaminophen, levalbuterol, ondansetron (ZOFRAN) IV, sodium chloride   Antibiotics   Anti-infectives    None        Subjective:   No f/c, bladder or flank pain. No dyspnea    Objective:   Blood pressure 120/67, pulse 67, temperature 98.6 F (37 C), temperature source Oral, resp. rate 18, height  (1.651 m), weight 121.927 kg (268 lb 12.8 oz), SpO2 99 %.  Wt Readings from Last 3 Encounters:  08/19/15 121.927 kg (268 lb 12.8 oz)  07/26/15 127.007 kg (280 lb)  07/09/15 119.75 kg (264 lb)     Intake/Output Summary (Last 24 hours) at 08/19/15 1331 Last data filed at  08/19/15 0500  Gross per 24 hour  Intake    600 ml  Output   2200 ml  Net  -1600 ml    Exam  General: Alert and oriented x 3, NAD  CVS: S1 S2 clear, RRR  Resp: Diminished due to size, no wheezing  Abdomen :  morbidly obese Soft, nontender, distended, + bowel sounds  GU: catheter with yellow urine and sediment  Ext: no cyanosis clubbing, 1+ pitting edema.     Data Review   Micro Results No results found for this or any previous visit (from the past 240 hour(s)).  Radiology Reports Dg Chest Portable 1 View  08/13/2015   CLINICAL DATA:  Shortness of breath. History of hypertension, diabetes and atrial fibrillation.  EXAM: PORTABLE CHEST - 1 VIEW  COMPARISON:  07/08/2015 and 06/25/2015.  FINDINGS: The heart appears mildly enlarged. There is vascular congestion with new perihilar and lower lobe airspace opacities most consistent with edema. There may be a small amount of pleural fluid bilaterally. No acute osseous findings are demonstrated. Telemetry leads overlie the chest.  IMPRESSION: New bilateral pulmonary opacities most consistent with edema and congestive heart failure. Possible small bilateral pleural effusions.   Electronically Signed   By: Carey Bullocks M.D.   On: 08/13/2015 19:58    CBC  Recent Labs Lab 08/13/15 1935 08/17/15 0438  WBC 6.3 6.8  HGB 11.8* 11.5*  HCT 36.3* 35.5*  PLT 240 230  MCV 89.6 90.1  MCH 29.1 29.2  MCHC 32.5 32.4  RDW 14.5 14.4    Chemistries   Recent Labs Lab 08/15/15 0508 08/16/15 0356 08/17/15 0438 08/18/15 0909 08/19/15 0437  NA 138 139 137 138 135  K 4.2 3.9 3.8 3.8 3.4*  CL 110 108 103 97* 94*  CO2 33*  GLUCOSE 217* 102* 103* 205* 148*  BUN 21* 21* 18 17 21*  CREATININE 1.14 1.07 0.95 0.97 1.00  CALCIUM 8.7* 8.9 9.4 9.5 8.9   ------------------------------------------------------------------------------------------------------------------ estimated creatinine clearance is 89.3 mL/min (by C-G formula  based on Cr of 1). ------------------------------------------------------------------------------------------------------------------ No results for input(s): HGBA1C in the last 72 hours. ------------------------------------------------------------------------------------------------------------------ No results for input(s): CHOL, HDL, LDLCALC, TRIG, CHOLHDL, LDLDIRECT in the last 72 hours. ------------------------------------------------------------------------------------------------------------------ No results for input(s): TSH, T4TOTAL, T3FREE, THYROIDAB in the last 72 hours.  Invalid input(s): FREET3 ------------------------------------------------------------------------------------------------------------------ No results for input(s): VITAMINB12, FOLATE, FERRITIN, TIBC, IRON, RETICCTPCT in the last  72 hours.  Coagulation profile No results for input(s): INR, PROTIME in the last 168 hours.  No results for input(s): DDIMER in the last 72 hours.  Cardiac Enzymes No results for input(s): CKMB, TROPONINI, MYOGLOBIN in the last 168 hours.  Invalid input(s): CK ------------------------------------------------------------------------------------------------------------------ Invalid input(s): POCBNP   Recent Labs  08/18/15 0741 08/18/15 1136 08/18/15 1653 08/18/15 2034 08/19/15 0730 08/19/15 1133  GLUCAP 119* 164* 146* 106* 132* 269*     SULLIVAN,CORINNA L  MD. [facility] 08/19/2015, 1:31 PM [email or web address] - password [facility]

## 2015-08-19 NOTE — Evaluation (Addendum)
Occupational Therapy Evaluation Patient Details Name: ODESSA MORREN MRN: [id number] DOB: [birth year] Today's Date: 08/19/2015    History of Present Illness Pt is a 64 y.o. male admitted 08/13/15 for SOB, DOE and CHF. PMH consists of a fib with  RVR, DM, PAF, HTN, CHF and BLE edema.   Clinical Impression   Pt admitted with above. Pt independent with ADLs, PTA. Feel pt will benefit from acute OT to increase independence prior to d/c. Feel pt will benefit from SNF(noted chart says they have accepted bed), but feel he could manage at home with [facility] therapy and he has 24/7 assist/supervision at home.    Follow Up Recommendations  SNF;Home health OT (see above comments)   Equipment Recommendations  Other (comment) (AE)    Recommendations for Other Services       Precautions / Restrictions Precautions Precautions: Fall;Other (comment) Precaution Comments: monitor O2 Restrictions Weight Bearing Restrictions: No      Mobility Bed Mobility               General bed mobility comments: not assessed  Transfers Overall transfer level: Needs assistance Equipment used: Rolling walker (2 wheeled) Transfers: Sit to/from Stand Sit to Stand: Min assist;Min guard         General transfer comment: first stand from chair, pt and tech assisted with transfer, but feel 1 person could have done it. pt able to stand at least two more times in session with Min guard assist.     Balance      Used RW for ambulation.                                       ADL Overall ADL's : Needs assistance/impaired     Grooming: Wash/dry face;Standing;Set up;Supervision/safety               Lower Body Dressing: Moderate assistance;Sit to/from stand   Toilet Transfer: Min guard;Minimal assistance;Ambulation;RW (sit to stand from chair)           Functional mobility during ADLs: Min guard;Rolling walker (initially gave Min assist) General ADL Comments: Educated on energy  conservation.  Encouraged pt to be doing what he can. Discussed sockaid. Recommended pt elevate feet.     Vision     Perception     Praxis      Pertinent Vitals/Pain Pain Assessment: 0-10 Pain Score: 5  Pain Location: bilateral feet Pain Intervention(s): Monitored during session;Repositioned   HR in 60s in session and O2 remained in 90s with pt on 2L of O2.      Hand Dominance Right   Extremity/Trunk Assessment Upper Extremity Assessment Upper Extremity Assessment: Overall WFL for tasks assessed   Lower Extremity Assessment Lower Extremity Assessment: Defer to PT evaluation       Communication Communication Communication: No difficulties   Cognition Arousal/Alertness: Awake/alert Behavior During Therapy: WFL for tasks assessed/performed Overall Cognitive Status: Within Functional Limits for tasks assessed                     General Comments       Exercises       Shoulder Instructions      Home Living Family/patient expects to be discharged to:: Skilled nursing facility Living Arrangements: Spouse/significant other Available Help at Discharge: Family;Available 24 hours/day Type of Home: House Home Access: Stairs to enter [employer] of Steps: 4 Entrance Stairs-Rails: Right;Can  reach both;Left Home Layout: One level               Home Equipment: Walker - 2 wheels;Cane - single point;Bedside commode;Shower seat;Tub bench          Prior Functioning/Environment Level of Independence: Independent             OT Diagnosis: Generalized weakness;Acute pain   OT Problem List: Decreased strength;Decreased range of motion;Decreased activity tolerance;Decreased knowledge of use of DME or AE;Decreased knowledge of precautions;Pain;Increased edema;Obesity; decreased balance   OT Treatment/Interventions: Self-care/ADL training;DME and/or AE instruction;Therapeutic activities;Balance training;Patient/family education;Therapeutic  exercise;Energy conservation    OT Goals(Current goals can be found in the care plan section) Acute Rehab OT Goals Patient Stated Goal: not stated OT Goal Formulation: With patient Time For Goal Achievement: 08/26/15 Potential to Achieve Goals: Good ADL Goals Pt Will Perform Lower Body Bathing: sit to/from stand;with adaptive equipment;with set-up;with supervision Pt Will Perform Lower Body Dressing: with set-up;with supervision;with adaptive equipment;sit to/from stand Pt Will Transfer to Toilet: ambulating;with supervision Pt Will Perform Toileting - Clothing Manipulation and hygiene: with set-up;sit to/from stand  OT Frequency: Min 2X/week   Barriers to D/C:            Co-evaluation              End of Session Equipment Utilized During Treatment: Gait belt;Rolling walker; Oxygen Nurse Communication: Mobility status; talked with pt about doing some things for himself   Activity Tolerance: Patient tolerated treatment well; limited by fatigue Patient left: in chair;with call bell/phone within reach   Time: 1540-1600 OT Time Calculation (min): 20 min Charges:  OT General Charges $OT Visit: 1 Procedure OT Evaluation $Initial OT Evaluation Tier I: 1 Procedure G-CodesEarlie Raveling OTR/L [phone] 08/19/2015, 4:11 PM

## 2015-08-19 NOTE — Progress Notes (Signed)
Subjective:  Sitting in chair outside of bed, no shortness of breath  Objective:  Vital Signs in the last 24 hours: BP 111/62 mmHg  Pulse 56  Temp(Src) 99.1 F (37.3 C) (Oral)  Resp 19  Ht  (1.651 m)  Wt 121.927 kg (268 lb 12.8 oz)  BMI 44.73 kg/m2  SpO2 98%  Physical Exam: Large obese black male in no acute distress Lungs: Reduce breath sounds Cardiac:  Regular rhythm, normal S1 and S2, no S3 Extremities:  2+ edema noted, Foley catheter present  Intake/Output from previous day: 09/23 0701 - 09/24 0700 In: 1000 [P.O.:1000] Out: 2200 [Urine:2200] Weight Filed Weights   08/17/15 0432 08/18/15 0400 08/19/15 0413  Weight: 127.733 kg (281 lb 9.6 oz) 122.018 kg (269 lb) 121.927 kg (268 lb 12.8 oz)   Lab Results: Basic Metabolic Panel:  Recent Labs  16/10/96 0909 08/19/15 0437  NA 138 135  K 3.8 3.4*  CL 97* 94*  CO2 31 33*  GLUCOSE 205* 148*  BUN 17 21*  CREATININE 0.97 1.00   CBC:  Recent Labs  08/17/15 0438  WBC 6.8  HGB 11.5*  HCT 35.5*  MCV 90.1  PLT 230   BNP    Component Value Date/Time   BNP 250.8* 08/13/2015 1935    PROTIME: Lab Results  Component Value Date   INR 1.50* 05/20/2014   INR 0.97 07/17/2011   Telemetry: Currently normal sinus rhythm  Assessment/Plan:  1.  Acute on chronic diastolic heart failure and used to diurese 2.  Paroxysmal atrial fibrillation currently in sinus rhythm 3.  Urinary retention and UTI  4.  Hypertensive heart disease  Recommendations:  Renal function is remaining stable and he continues to diurese intravenously.  He likely will need to be her over the weekend and I would continue on IV diuresis over the weekend with plans to switch to oral therapy.  Try to get as much fluid off his he can while he is hospitalized.  Plans are for skilled nursing according to chart.      Darden Palmer  MD [facility] Card[facility]  08/19/2015, 9:25 AM

## 2015-08-20 DIAGNOSIS — N390 Urinary tract infection, site not specified: Secondary | ICD-10-CM

## 2015-08-20 LAB — BASIC METABOLIC PANEL
ANION GAP: 10 (ref 5–15)
BUN: 24 mg/dL — ABNORMAL HIGH (ref 6–20)
CALCIUM: 9.3 mg/dL (ref 8.9–10.3)
CHLORIDE: 96 mmol/L — AB (ref 101–111)
CO2: 31 mmol/L (ref 22–32)
Creatinine, Ser: 0.96 mg/dL (ref 0.61–1.24)
GFR calc non Af Amer: >60 mL/min (ref >60)
GLUCOSE: 137 mg/dL — AB (ref 65–99)
POTASSIUM: 3.6 mmol/L (ref 3.5–5.1)
Sodium: 137 mmol/L (ref 135–145)

## 2015-08-20 LAB — GLUCOSE, CAPILLARY
GLUCOSE-CAPILLARY: 219 mg/dL — AB (ref 65–99)
GLUCOSE-CAPILLARY: 119 mg/dL — AB (ref 65–99)
GLUCOSE-CAPILLARY: 118 mg/dL — AB (ref 65–99)
Glucose-Capillary: 145 mg/dL — ABNORMAL HIGH (ref 65–99)

## 2015-08-20 MED ORDER — FUROSEMIDE 10 MG/ML IJ SOLN
INTRAMUSCULAR | Status: AC
  Filled 2015-08-20: qty 4

## 2015-08-20 NOTE — Progress Notes (Signed)
[facility]                                                                              Patient Demographics  Raymond Gordon, is a 64 y.o. male, DOB - 1951-04-22, ZOX:[id number]  Admit date - 08/13/2015   Admitting Physician Ron Parker, MD  Outpatient Primary MD for the patient is Gaspar Garbe, MD  LOS - 7   Chief Complaint  Patient presents with  . Shortness of Breath       Brief HPI   Raymond Gordon is a 64 y.o. male with a history of Diastolic CHF, EF =81% on last 2D ECHO 06/2015, HTN, PAF on Pradxaa Rx, DM2 and OSA who presents to the ED with complaints of worsening SOB, DOE and orthopnea since last night. He also reported having increasing lower leg edema x 5 days. He denied Chest Pain. EMS was called and he was found to have hypoxia with O2 sats of 89% And he was placed on 4 liters NCO2. In the ED he continue to have respiratory distress and his O2 was increased to a NRB at 15 liters and then he was placed on BiPAP.   Assessment & Plan     Acute on chronic respiratory failure secondary to Acute on chronic diastolic CHF (congestive heart failure), obesity hypoventilation, severe obstructive sleep apnea Acute on chronic diastolic CHF:  - 2-D echo in 8/16 showed EF of 65-70% with grade 2 diastolic dysfunction - Continue IV Lasix, - Cardiology following  Bacteruria: asymptomatic. Will check urine culture, hold off on antibiotics unless develops symptoms.    Essential hypertension - Currently stable, continue Coreg,  Diovan, Cardura Diltiazem d/c'd by cards    Hyperlipidemia - Continue statin    Severe OSA (obstructive sleep apnea), obesity hypoventilation, morbid obesity - Patient strongly counseled on diet and weight control - CPAP daily at bedtime    Uncontrolled diabetes mellitus - Blood sugars elevated, hemoglobin A1c 8.0 - Placed on sliding scale, added meal coverage 10units TID, Lantus 10units     Atrial fibrillation -  Continue Pradaxa, carvedilol  Urinary retention: has had foley PTA. F/u Dr. Brunilda Payor as outpt. Was treated for prostatitis a few months ago, and required voiding trial, then foley replaced per sister who is a Engineer, civil (consulting) at [employer]. -plan to d/c with foley  Deconditioning: Physical therapy recommending skilled nursing facility. Patient and family agreeable.  social work following  Code Status: full code  Family Communication: mother at bedside  Disposition Plan: to SNF when stable off iv lasix  Time Spent in minutes   25 min  Procedure Chest x-ray  consults Cardiology  DVT Prophylaxis Lovenox   medications   Scheduled Meds: . allopurinol  300 mg Oral Daily  . atorvastatin  40 mg Oral q1800  . carvedilol  25 mg Oral BID WC  . dabigatran  150 mg Oral Q12H  . doxazosin  4 mg Oral Daily  . furosemide  60 mg Intravenous TID  . insulin aspart  0-15 Units Subcutaneous TID WC  . insulin aspart  0-5 Units Subcutaneous QHS  . insulin aspart  10 Units Subcutaneous TID  WC  . insulin glargine  10 Units Subcutaneous Daily  . irbesartan  150 mg Oral Daily  . isosorbide mononitrate  30 mg Oral Daily  . pantoprazole  40 mg Oral Daily  . sodium chloride  3 mL Intravenous Q12H  . tamsulosin  0.4 mg Oral QPC supper   Continuous Infusions:   PRN Meds:.sodium chloride, acetaminophen, levalbuterol, ondansetron (ZOFRAN) IV, sodium chloride   Antibiotics   Anti-infectives    None        Subjective:   Feeling and breathing better    Objective:   Blood pressure 134/46, pulse 74, temperature 99.3 F (37.4 C), temperature source Oral, resp. rate 18, height  (1.651 m), weight 116.393 kg (256 lb 9.6 oz), SpO2 96 %.  Wt Readings from Last 3 Encounters:  08/20/15 116.393 kg (256 lb 9.6 oz)  07/26/15 127.007 kg (280 lb)  07/09/15 119.75 kg (264 lb)     Intake/Output Summary (Last 24 hours) at 08/20/15 1013 Last data filed at 08/20/15 1308  Gross per 24 hour  Intake   1580 ml    Output   3151 ml  Net  -1571 ml    Exam  General: Alert and oriented x 3, NAD  CVS: S1 S2 clear, RRR  Resp: Diminished due to size, no wheezing  Abdomen :  morbidly obese Soft, nontender, distended, + bowel sounds  GU: catheter with yellow urine and sediment  Ext: +edema    Data Review   Micro Results No results found for this or any previous visit (from the past 240 hour(s)).  Radiology Reports Dg Chest Portable 1 View  08/13/2015   CLINICAL DATA:  Shortness of breath. History of hypertension, diabetes and atrial fibrillation.  EXAM: PORTABLE CHEST - 1 VIEW  COMPARISON:  07/08/2015 and 06/25/2015.  FINDINGS: The heart appears mildly enlarged. There is vascular congestion with new perihilar and lower lobe airspace opacities most consistent with edema. There may be a small amount of pleural fluid bilaterally. No acute osseous findings are demonstrated. Telemetry leads overlie the chest.  IMPRESSION: New bilateral pulmonary opacities most consistent with edema and congestive heart failure. Possible small bilateral pleural effusions.   Electronically Signed   By: Carey Bullocks M.D.   On: 08/13/2015 19:58    CBC  Recent Labs Lab 08/13/15 1935 08/17/15 0438  WBC 6.3 6.8  HGB 11.8* 11.5*  HCT 36.3* 35.5*  PLT 240 230  MCV 89.6 90.1  MCH 29.1 29.2  MCHC 32.5 32.4  RDW 14.5 14.4    Chemistries   Recent Labs Lab 08/16/15 0356 08/17/15 0438 08/18/15 0909 08/19/15 0437 08/20/15 0554  NA 139 137 138 135 137  K 3.9 3.8 3.8 3.4* 3.6  CL 108 103 97* 94* 96*  CO2 33* 31  GLUCOSE 102* 103* 205* 148* 137*  BUN 21* 18 17 21* 24*  CREATININE 1.07 0.95 0.97 1.00 0.96  CALCIUM 8.9 9.4 9.5 8.9 9.3   ------------------------------------------------------------------------------------------------------------------ estimated creatinine clearance is 90.6 mL/min (by C-G formula based on Cr of  0.96). ------------------------------------------------------------------------------------------------------------------ No results for input(s): HGBA1C in the last 72 hours. ------------------------------------------------------------------------------------------------------------------ No results for input(s): CHOL, HDL, LDLCALC, TRIG, CHOLHDL, LDLDIRECT in the last 72 hours. ------------------------------------------------------------------------------------------------------------------ No results for input(s): TSH, T4TOTAL, T3FREE, THYROIDAB in the last 72 hours.  Invalid input(s): FREET3 ------------------------------------------------------------------------------------------------------------------ No results for input(s): VITAMINB12, FOLATE, FERRITIN, TIBC, IRON, RETICCTPCT in the last 72 hours.  Coagulation profile No results for input(s): INR, PROTIME in the last 168 hours.  No results for input(s): DDIMER in the last 72 hours.  Cardiac Enzymes No results for input(s): CKMB, TROPONINI, MYOGLOBIN in the last 168 hours.  Invalid input(s): CK ------------------------------------------------------------------------------------------------------------------ Invalid input(s): POCBNP   Recent Labs  08/18/15 2034 08/19/15 0730 08/19/15 1133 08/19/15 1643 08/19/15 2141 08/20/15 0735  GLUCAP 106* 132* 269* 106* 76 145*     VANN, JESSICA  DO [facility] 08/20/2015, 10:13 AM [email or web address] - password [facility]

## 2015-08-20 NOTE — Progress Notes (Signed)
Subjective:  Sitting in chair outside of bed, no shortness of breath, continues to diurese significantly but with continued edema.  Renal function remained stable.  Objective:  Vital Signs in the last 24 hours: BP 134/46 mmHg  Pulse 74  Temp(Src) 99.3 F (37.4 C) (Oral)  Resp 18  Ht  (1.651 m)  Wt 116.393 kg (256 lb 9.6 oz)  BMI 42.70 kg/m2  SpO2 96%  Physical Exam: Large obese black male in no acute distress Lungs: Reduce breath sounds Cardiac:  Regular rhythm, normal S1 and S2, no S3 Extremities:  2+ edema noted, Foley catheter present  Intake/Output from previous day: 09/24 0701 - 09/25 0700 In: 1560 [P.O.:1560] Out: 3150 [Urine:3150] Weight Filed Weights   08/18/15 0400 08/19/15 0413 08/20/15 0559  Weight: 122.018 kg (269 lb) 121.927 kg (268 lb 12.8 oz) 116.393 kg (256 lb 9.6 oz)   Lab Results: Basic Metabolic Panel:  Recent Labs  16/10/96 0437 08/20/15 0554  NA 135 137  K 3.4* 3.6  CL 94* 96*  CO2 33* 31  GLUCOSE 148* 137*  BUN 21* 24*  CREATININE 1.00 0.96   BNP    Component Value Date/Time   BNP 250.8* 08/13/2015 1935    PROTIME: Lab Results  Component Value Date   INR 1.50* 05/20/2014   INR 0.97 07/17/2011   Telemetry: Currently normal sinus rhythm  Assessment/Plan:  1.  Acute on chronic diastolic heart failure continues to diurese on intravenous Lasix 2.  Paroxysmal atrial fibrillation currently in sinus rhythm 3.  Urinary retention and UTI  4.  Hypertensive heart disease  Recommendations:  Renal function is remaining stable and he continues to diurese intravenously.  I would continue on IV diuresis over the weekend with plans to switch to oral therapy when closer to discharge.  Try to get as much fluid off his he can while he is hospitalized.  Plans are for skilled nursing according to chart.      Darden Palmer  MD [facility] Cardiology  08/20/2015, 8:37 AM

## 2015-08-21 LAB — BASIC METABOLIC PANEL
Anion gap: 11 (ref 5–15)
BUN: 25 mg/dL — ABNORMAL HIGH (ref 6–20)
CALCIUM: 9.1 mg/dL (ref 8.9–10.3)
CO2: 27 mmol/L (ref 22–32)
CREATININE: 0.97 mg/dL (ref 0.61–1.24)
Chloride: 97 mmol/L — ABNORMAL LOW (ref 101–111)
GFR calc Af Amer: >60 mL/min (ref >60)
GFR calc non Af Amer: >60 mL/min (ref >60)
GLUCOSE: 243 mg/dL — AB (ref 65–99)
Potassium: 3.8 mmol/L (ref 3.5–5.1)
Sodium: 135 mmol/L (ref 135–145)

## 2015-08-21 LAB — GLUCOSE, CAPILLARY
GLUCOSE-CAPILLARY: 137 mg/dL — AB (ref 65–99)
Glucose-Capillary: 217 mg/dL — ABNORMAL HIGH (ref 65–99)
Glucose-Capillary: 173 mg/dL — ABNORMAL HIGH (ref 65–99)
Glucose-Capillary: 119 mg/dL — ABNORMAL HIGH (ref 65–99)

## 2015-08-21 MED ORDER — INSULIN GLARGINE 100 UNIT/ML ~~LOC~~ SOLN
15.0000 [IU] | Freq: Every day | SUBCUTANEOUS | Status: DC
  Administered 2015-08-22: 15 [IU] via SUBCUTANEOUS
  Filled 2015-08-21: qty 0.15

## 2015-08-21 NOTE — Progress Notes (Signed)
PT Cancellation Note  Patient Details Name: Raymond Gordon MRN: [id number] DOB: 04/05/1951   Cancelled Treatment:    Reason Eval/Treat Not Completed: Other (comment).  Pt is eating dinner. PT to check back tomorrow.  Thanks,   Rollene Rotunda. [place], PT, DPT [phone]   08/21/2015, 5:12 PM

## 2015-08-21 NOTE — Progress Notes (Signed)
CSW spoke with Patton Salles, Liaison for [facility]- updated clinical issues for patient re: need for special foley catheter.  Received call from that facility would be able to accept patient with this foley catheter if indicated at d/c.  CSW services will continue to monitor for possible date of stability and will assist with d/c to SNF at that time.  Lorri Frederick. Jaci Lazier, [name] [phone]

## 2015-08-21 NOTE — Progress Notes (Signed)
[facility]                                                                              Patient Demographics  Raymond Gordon, is a 64 y.o. male, DOB - 1950-11-30, ZOX:[id number]  Admit date - 08/13/2015   Admitting Physician Ron Parker, MD  Outpatient Primary MD for the patient is Gaspar Garbe, MD  LOS - 8   Chief Complaint  Patient presents with  . Shortness of Breath       Brief HPI   Raymond Gordon is a 64 y.o. male with a history of Diastolic CHF, EF =81% on last 2D ECHO 06/2015, HTN, PAF on Pradxaa Rx, DM2 and OSA who presents to the ED with complaints of worsening SOB, DOE and orthopnea since last night. He also reported having increasing lower leg edema x 5 days. He denied Chest Pain. EMS was called and he was found to have hypoxia with O2 sats of 89% And he was placed on 4 liters NCO2. In the ED he continue to have respiratory distress and his O2 was increased to a NRB at 15 liters and then he was placed on BiPAP.   Assessment & Plan     Acute on chronic respiratory failure secondary to Acute on chronic diastolic CHF (congestive heart failure), obesity hypoventilation, severe obstructive sleep apnea Acute on chronic diastolic CHF:  - 2-D echo in 8/16 showed EF of 65-70% with grade 2 diastolic dysfunction - Continue IV Lasix, - Cardiology following  Bacteruria: asymptomatic. Will check urine culture, hold off on antibiotics unless develops symptoms.    Essential hypertension - Currently stable, continue Coreg,  Diovan, Cardura Diltiazem d/c'd by cards    Hyperlipidemia - Continue statin    Severe OSA (obstructive sleep apnea), obesity hypoventilation, morbid obesity - Patient strongly counseled on diet and weight control - CPAP daily at bedtime    Uncontrolled diabetes mellitus - Blood sugars elevated, hemoglobin A1c 8.0 - Placed on sliding scale, added meal coverage 10units TID, Lantus 10units     Atrial fibrillation -  Continue Pradaxa, carvedilol  Urinary retention: has had foley PTA. F/u Dr. Brunilda Payor as outpt. Was treated for prostatitis a few months ago, and required voiding trial, then foley replaced per sister who is a Engineer, civil (consulting) at [employer]. -plan to d/c with foley  Deconditioning: Physical therapy recommending skilled nursing facility. Patient and family agreeable.  social work following  Code Status: full code  Family Communication: mother at bedside  Disposition Plan: to SNF when stable off iv lasix  Time Spent in minutes   15 min  Procedure Chest x-ray  consults Cardiology  DVT Prophylaxis Lovenox   medications   Scheduled Meds: . allopurinol  300 mg Oral Daily  . atorvastatin  40 mg Oral q1800  . carvedilol  25 mg Oral BID WC  . dabigatran  150 mg Oral Q12H  . doxazosin  4 mg Oral Daily  . furosemide  60 mg Intravenous TID  . insulin aspart  0-15 Units Subcutaneous TID WC  . insulin aspart  0-5 Units Subcutaneous QHS  . insulin aspart  10 Units Subcutaneous TID  WC  . insulin glargine  10 Units Subcutaneous Daily  . irbesartan  150 mg Oral Daily  . isosorbide mononitrate  30 mg Oral Daily  . pantoprazole  40 mg Oral Daily  . sodium chloride  3 mL Intravenous Q12H  . tamsulosin  0.4 mg Oral QPC supper   Continuous Infusions:   PRN Meds:.sodium chloride, acetaminophen, levalbuterol, ondansetron (ZOFRAN) IV, sodium chloride   Antibiotics   Anti-infectives    None        Subjective:  Continues to sit with legs dangling    Objective:   Blood pressure 126/46, pulse 61, temperature 98.6 F (37 C), temperature source Oral, resp. rate 18, height  (1.651 m), weight 116.212 kg (256 lb 3.2 oz), SpO2 100 %.  Wt Readings from Last 3 Encounters:  08/21/15 116.212 kg (256 lb 3.2 oz)  07/26/15 127.007 kg (280 lb)  07/09/15 119.75 kg (264 lb)     Intake/Output Summary (Last 24 hours) at 08/21/15 1111 Last data filed at 08/21/15 1012  Gross per 24 hour  Intake    716 ml    Output   2976 ml  Net  -2260 ml    Exam  General: Alert and oriented x 3, NAD  CVS: S1 S2 clear, RRR  Resp: Diminished due to size, no wheezing  Abdomen :  morbidly obese Soft, nontender, distended, + bowel sounds  Ext: +edema, wrinkles becoming more apparent in skin    Data Review   Micro Results Recent Results (from the past 240 hour(s))  Urine culture     Status: None (Preliminary result)   Collection Time: 08/19/15  7:55 PM  Result Value Ref Range Status   Specimen Description URINE, CATHETERIZED  Final   Special Requests NONE  Final   Culture >=100,000 COLONIES/mL GRAM NEGATIVE RODS  Final   Report Status PENDING  Incomplete    Radiology Reports Dg Chest Portable 1 View  08/13/2015   CLINICAL DATA:  Shortness of breath. History of hypertension, diabetes and atrial fibrillation.  EXAM: PORTABLE CHEST - 1 VIEW  COMPARISON:  07/08/2015 and 06/25/2015.  FINDINGS: The heart appears mildly enlarged. There is vascular congestion with new perihilar and lower lobe airspace opacities most consistent with edema. There may be a small amount of pleural fluid bilaterally. No acute osseous findings are demonstrated. Telemetry leads overlie the chest.  IMPRESSION: New bilateral pulmonary opacities most consistent with edema and congestive heart failure. Possible small bilateral pleural effusions.   Electronically Signed   By: Carey Bullocks M.D.   On: 08/13/2015 19:58    CBC  Recent Labs Lab 08/17/15 0438  WBC 6.8  HGB 11.5*  HCT 35.5*  PLT 230  MCV 90.1  MCH 29.2  MCHC 32.4  RDW 14.4    Chemistries   Recent Labs Lab 08/17/15 0438 08/18/15 0909 08/19/15 0437 08/20/15 0554 08/21/15 0829  NA 137 138 135 137 135  K 3.8 3.8 3.4* 3.6 3.8  CL 103 97* 94* 96* 97*  CO2 24 31 33* 31 27  GLUCOSE 103* 205* 148* 137* 243*  BUN 18 17 21* 24* 25*  CREATININE 0.95 0.97 1.00 0.96 0.97  CALCIUM 9.4 9.5 8.9 9.3 9.1    ------------------------------------------------------------------------------------------------------------------ estimated creatinine clearance is 89.6 mL/min (by C-G formula based on Cr of 0.97). ------------------------------------------------------------------------------------------------------------------ No results for input(s): HGBA1C in the last 72 hours. ------------------------------------------------------------------------------------------------------------------ No results for input(s): CHOL, HDL, LDLCALC, TRIG, CHOLHDL, LDLDIRECT in the last 72 hours. ------------------------------------------------------------------------------------------------------------------ No results for input(s): TSH,  T4TOTAL, T3FREE, THYROIDAB in the last 72 hours.  Invalid input(s): FREET3 ------------------------------------------------------------------------------------------------------------------ No results for input(s): VITAMINB12, FOLATE, FERRITIN, TIBC, IRON, RETICCTPCT in the last 72 hours.  Coagulation profile No results for input(s): INR, PROTIME in the last 168 hours.  No results for input(s): DDIMER in the last 72 hours.  Cardiac Enzymes No results for input(s): CKMB, TROPONINI, MYOGLOBIN in the last 168 hours.  Invalid input(s): CK ------------------------------------------------------------------------------------------------------------------ Invalid input(s): POCBNP   Recent Labs  08/19/15 2141 08/20/15 0735 08/20/15 1122 08/20/15 1649 08/20/15 2131 08/21/15 0720  GLUCAP 76 145* 219* 119* 118* 137*     VANN, JESSICA  DO [facility] 08/21/2015, 11:11 AM [email or web address] - password [facility]

## 2015-08-21 NOTE — Progress Notes (Signed)
Pt is on CPAP at time, no complications noted. Pressure of 10.  Pt is stable at this time.

## 2015-08-21 NOTE — Progress Notes (Signed)
Patient Name: Raymond Gordon Date of Encounter: 08/21/2015  Principal Problem:   Acute diastolic CHF (congestive heart failure) Active Problems:   Essential hypertension   Hyperlipidemia   Severe OSA (obstructive sleep apnea)   Acute respiratory failure with hypoxia   Uncontrolled diabetes mellitus   Atrial fibrillation   Bacteriuria, asymptomatic   Primary Cardiologist: Dr. Allyson Sabal Patient Profile: 64 yo male w/PMH of PAF, HTN, HLD, Type II DM, and acute diastolic CHF admitted on 08/13/2015 for shortness of breath and lower extremity edema.  SUBJECTIVE: Reports his breathing is improving. Still having SOB with exertion. Denies any chest pain or palpitations.  OBJECTIVE Filed Vitals:   08/20/15 2250 08/21/15 0500 08/21/15 0758 08/21/15 1027  BP:  122/61 126/42 126/46  Pulse:  54 61   Temp:  99.2 F (37.3 C) 98.6 F (37 C)   TempSrc:  Oral Oral   Resp: Height:      Weight:  256 lb 3.2 oz (116.212 kg)    SpO2:  99% 100%     Intake/Output Summary (Last 24 hours) at 08/21/15 1037 Last data filed at 08/21/15 1012  Gross per 24 hour  Intake    716 ml  Output   2976 ml  Net  -2260 ml   Filed Weights   08/19/15 0413 08/20/15 0559 08/21/15 0500  Weight: 268 lb 12.8 oz (121.927 kg) 256 lb 9.6 oz (116.393 kg) 256 lb 3.2 oz (116.212 kg)    PHYSICAL EXAM General: Well developed, well nourished [place], male in no acute distress. Head: Normocephalic, atraumatic.  Neck: Supple without bruits, JVD unable to be assessed due to body habitus. Lungs:  Resp regular and unlabored, Diminished breath sounds at bases. Heart: RRR, S1, S2, no S3, S4, or murmur; no rub. Abdomen: Soft, non-tender, non-distended with normoactive bowel sounds. No hepatomegaly. No rebound/guarding. No obvious abdominal masses. Extremities: No clubbing or cyanosis. 1+ pitting edema bilaterally. Distal pedal pulses are 2+ bilaterally. Neuro: Alert and oriented X 3. Moves all extremities  spontaneously. Psych: Normal affect.   LABS: Basic Metabolic Panel: Recent Labs  08/20/15 0554 08/21/15 0829  NA 137 135  K 3.6 3.8  CL 96* 97*  CO2 31 27  GLUCOSE 137* 243*  BUN 24* 25*  CREATININE 0.96 0.97  CALCIUM 9.3 9.1  No results for input(s): TROPIPOC in the last 72 hours. BNP:  B NATRIURETIC PEPTIDE  Date/Time Value Ref Range Status  08/13/2015 07:35 PM 250.8* 0.0 - 100.0 pg/mL Final  07/08/2015 06:34 AM 74.8 0.0 - 100.0 pg/mL Final   PRO B NATRIURETIC PEPTIDE (BNP)  Date/Time Value Ref Range Status  05/20/2014 01:09 PM 302.3* 0 - 125 pg/mL Final  07/17/2011 03:37 PM 163.8* 0 - 125 pg/mL Final   TELE: Sinus rhythm with rate in 50's - 60's.       Current Medications:  . allopurinol  300 mg Oral Daily  . atorvastatin  40 mg Oral q1800  . carvedilol  25 mg Oral BID WC  . dabigatran  150 mg Oral Q12H  . doxazosin  4 mg Oral Daily  . furosemide  60 mg Intravenous TID  . insulin aspart  0-15 Units Subcutaneous TID WC  . insulin aspart  0-5 Units Subcutaneous QHS  . insulin aspart  10 Units Subcutaneous TID WC  . insulin glargine  10 Units Subcutaneous Daily  . irbesartan  150 mg Oral Daily  . isosorbide mononitrate  30 mg Oral Daily  .  pantoprazole  40 mg Oral Daily  . sodium chloride  3 mL Intravenous Q12H  . tamsulosin  0.4 mg Oral QPC supper      ASSESSMENT AND PLAN:  1. Acute diastolic CHF (congestive heart failure) - Diuresed Net -19.6L since admission. Weight down from 292lbs on admission to 256lbs on 08/21/2015. -Continue diuresis with Lasix  IV TID. Creatinine stable at 0.97. Consider switch to PO Lasix in the next few days.  2. Essential hypertension - BP has been 105/42 - 136/61 in the past 24 hours. - continue current medication regimen.  3. Hyperlipidemia - continue statin  4. Severe OSA (obstructive sleep apnea) - on CPAP  5. Uncontrolled diabetes mellitus - on home rx and SSI - A1c 8.7  6. Paroxysmal Atrial fibrillation -  maintaining SR  -Continue Pradaxa and coreg  Signed, Ellsworth Lennox , PA-C 10:37 AM 08/21/2015 Pager: [phone]  Agree with note by Randall An PA-C  Pt well known to me admitted for diastolic CHF. Excellent diuresis 19 liters. Probably at dry weight. Lungs clear  No periph edema. Will get nutrition to educate re low salt diet at home. Change to POP diuretic tomorrow. Approaching MHB and can prob be D/Cd home next 24-48 hours with close OP f/u    Runell Gess, M.D., FACP, [facility], [facility] [address] [place], [name]  [address]  [phone] 08/21/2015 11:10 AM

## 2015-08-21 NOTE — Care Management Important Message (Signed)
Important Message  Patient Details  Name: Raymond Gordon MRN: [id number] Date of Birth: [birth year]   Medicare Important Message Given:  Yes-fourth notification given    Orson Aloe 08/21/2015, 3:15 PM

## 2015-08-21 NOTE — Progress Notes (Signed)
Occupational Therapy Treatment Patient Details Name: Raymond Gordon MRN: [id number] DOB: Aug 31, 1951 Today's Date: 08/21/2015    History of present illness Pt is Gordon 64 y.o. male admitted 08/13/15 for SOB, DOE and CHF. PMH consists of Gordon fib with  RVR, DM, PAF, HTN, CHF and BLE edema.   OT comments  Patient making slow progress towards OT goals. Continue to recommend SNF Rehab at discharge. OT will continue to follow.   Follow Up Recommendations  SNF;Supervision/Assistance - 24 hour    Equipment Recommendations  Other (comment) (AE for LB self-care and toilet hygiene)    Recommendations for Other Services      Precautions / Restrictions Precautions Precautions: Fall;Other (comment) Precaution Comments: monitor O2 Restrictions Weight Bearing Restrictions: No       Mobility Bed Mobility               General bed mobility comments: not assessed  Transfers Overall transfer level: Needs assistance Equipment used: Rolling walker (2 wheeled) Transfers: Sit to/from Stand Sit to Stand: Min guard              Balance                                   ADL Overall ADL's : Needs assistance/impaired Eating/Feeding: Independent;Sitting   Grooming: Wash/dry Clinical cytogeneticist: Min guard;Ambulation;Regular Toilet;Grab bars;RW   Toileting- Clothing Manipulation and Hygiene: Total assistance;Sit to/from stand       Functional mobility during ADLs: Min guard;Rolling walker General ADL Comments: Continued education on energy conservation techniques and available AE to assist with LB self-care and toilet hygiene. [name] present. Patient ambulated to and from bathroom for toileting. Dependent toilet hygiene after BM. Stood at sink to wash hands. Back in recliner at end of session with BLEs elevated.      Vision                     Perception     Praxis      Cognition   Behavior  During Therapy: WFL for tasks assessed/performed Overall Cognitive Status: Difficult to assess                       Extremity/Trunk Assessment               Exercises     Shoulder Instructions       General Comments      Pertinent Vitals/ Pain       Pain Assessment: No/denies pain  Home Living                                          Prior Functioning/Environment              Frequency Min 2X/week     Progress Toward Goals  OT Goals(current goals can now be found in the care plan section)  Progress towards OT goals: Progressing toward goals     Plan Discharge plan needs to be updated    Co-evaluation                 End of Session Equipment Utilized During Treatment: Rolling walker   Activity Tolerance Patient  tolerated treatment well   Patient Left in chair;with call bell/phone within reach;with chair alarm set;with family/visitor present   Nurse Communication         Time: 4098-1191 OT Time Calculation (min): 25 min  Charges: OT General Charges $OT Visit: 1 Procedure OT Treatments $Self Care/Home Management : 23-37 mins  Early, Raymond Gordon 08/21/2015, 12:40 PM

## 2015-08-22 ENCOUNTER — Ambulatory Visit: Payer: Medicare Other | Admitting: Cardiovascular Disease

## 2015-08-22 ENCOUNTER — Other Ambulatory Visit: Payer: Self-pay | Admitting: Physician Assistant

## 2015-08-22 DIAGNOSIS — I5033 Acute on chronic diastolic (congestive) heart failure: Secondary | ICD-10-CM | POA: Diagnosis not present

## 2015-08-22 DIAGNOSIS — R339 Retention of urine, unspecified: Secondary | ICD-10-CM | POA: Diagnosis not present

## 2015-08-22 DIAGNOSIS — G4733 Obstructive sleep apnea (adult) (pediatric): Secondary | ICD-10-CM | POA: Diagnosis not present

## 2015-08-22 DIAGNOSIS — M1A9XX1 Chronic gout, unspecified, with tophus (tophi): Secondary | ICD-10-CM | POA: Diagnosis not present

## 2015-08-22 DIAGNOSIS — R262 Difficulty in walking, not elsewhere classified: Secondary | ICD-10-CM | POA: Diagnosis not present

## 2015-08-22 DIAGNOSIS — N401 Enlarged prostate with lower urinary tract symptoms: Secondary | ICD-10-CM | POA: Diagnosis not present

## 2015-08-22 DIAGNOSIS — J9601 Acute respiratory failure with hypoxia: Secondary | ICD-10-CM | POA: Diagnosis not present

## 2015-08-22 DIAGNOSIS — N139 Obstructive and reflux uropathy, unspecified: Secondary | ICD-10-CM | POA: Diagnosis not present

## 2015-08-22 DIAGNOSIS — I482 Chronic atrial fibrillation: Secondary | ICD-10-CM | POA: Diagnosis not present

## 2015-08-22 DIAGNOSIS — I10 Essential (primary) hypertension: Secondary | ICD-10-CM | POA: Diagnosis not present

## 2015-08-22 DIAGNOSIS — E119 Type 2 diabetes mellitus without complications: Secondary | ICD-10-CM | POA: Diagnosis not present

## 2015-08-22 DIAGNOSIS — M6281 Muscle weakness (generalized): Secondary | ICD-10-CM | POA: Diagnosis not present

## 2015-08-22 DIAGNOSIS — N179 Acute kidney failure, unspecified: Secondary | ICD-10-CM

## 2015-08-22 DIAGNOSIS — K219 Gastro-esophageal reflux disease without esophagitis: Secondary | ICD-10-CM | POA: Diagnosis not present

## 2015-08-22 DIAGNOSIS — I480 Paroxysmal atrial fibrillation: Secondary | ICD-10-CM | POA: Diagnosis not present

## 2015-08-22 DIAGNOSIS — M79669 Pain in unspecified lower leg: Secondary | ICD-10-CM | POA: Diagnosis not present

## 2015-08-22 DIAGNOSIS — R2689 Other abnormalities of gait and mobility: Secondary | ICD-10-CM | POA: Diagnosis not present

## 2015-08-22 DIAGNOSIS — E785 Hyperlipidemia, unspecified: Secondary | ICD-10-CM | POA: Diagnosis not present

## 2015-08-22 DIAGNOSIS — R8271 Bacteriuria: Secondary | ICD-10-CM | POA: Diagnosis not present

## 2015-08-22 DIAGNOSIS — E1165 Type 2 diabetes mellitus with hyperglycemia: Secondary | ICD-10-CM | POA: Diagnosis not present

## 2015-08-22 DIAGNOSIS — I509 Heart failure, unspecified: Secondary | ICD-10-CM | POA: Diagnosis not present

## 2015-08-22 DIAGNOSIS — I5031 Acute diastolic (congestive) heart failure: Secondary | ICD-10-CM | POA: Diagnosis not present

## 2015-08-22 DIAGNOSIS — R5381 Other malaise: Secondary | ICD-10-CM | POA: Diagnosis not present

## 2015-08-22 DIAGNOSIS — N390 Urinary tract infection, site not specified: Secondary | ICD-10-CM | POA: Diagnosis not present

## 2015-08-22 LAB — URINE CULTURE

## 2015-08-22 LAB — CBC
HCT: 39 % (ref 39.0–52.0)
Hemoglobin: 12.8 g/dL — ABNORMAL LOW (ref 13.0–17.0)
MCH: 29.2 pg (ref 26.0–34.0)
MCHC: 32.8 g/dL (ref 30.0–36.0)
MCV: 88.8 fL (ref 78.0–100.0)
PLATELETS: 291 10*3/uL (ref 150–400)
RBC: 4.39 MIL/uL (ref 4.22–5.81)
RDW: 13.9 % (ref 11.5–15.5)
WBC: 5.7 10*3/uL (ref 4.0–10.5)

## 2015-08-22 LAB — GLUCOSE, CAPILLARY
GLUCOSE-CAPILLARY: 158 mg/dL — AB (ref 65–99)
Glucose-Capillary: 183 mg/dL — ABNORMAL HIGH (ref 65–99)

## 2015-08-22 MED ORDER — ISOSORBIDE MONONITRATE ER 30 MG PO TB24: 30.0000 mg | ORAL_TABLET | Freq: Every day | ORAL | Status: DC

## 2015-08-22 MED ORDER — INSULIN ASPART 100 UNIT/ML ~~LOC~~ SOLN: 0.0000 [IU] | Freq: Every day | SUBCUTANEOUS | Status: DC

## 2015-08-22 MED ORDER — FUROSEMIDE 80 MG PO TABS: 80.0000 mg | ORAL_TABLET | Freq: Two times a day (BID) | ORAL | Status: DC

## 2015-08-22 MED ORDER — INSULIN ASPART 100 UNIT/ML ~~LOC~~ SOLN: 10.0000 [IU] | Freq: Three times a day (TID) | SUBCUTANEOUS | Status: DC

## 2015-08-22 MED ORDER — LEVALBUTEROL HCL 0.63 MG/3ML IN NEBU: 0.6300 mg | INHALATION_SOLUTION | Freq: Three times a day (TID) | RESPIRATORY_TRACT | Status: DC | PRN

## 2015-08-22 MED ORDER — INSULIN ASPART 100 UNIT/ML ~~LOC~~ SOLN: 0.0000 [IU] | Freq: Three times a day (TID) | SUBCUTANEOUS | Status: DC

## 2015-08-22 MED ORDER — FUROSEMIDE 80 MG PO TABS
80.0000 mg | ORAL_TABLET | Freq: Two times a day (BID) | ORAL | Status: DC
  Administered 2015-08-22: 80 mg via ORAL
  Filled 2015-08-22: qty 1

## 2015-08-22 MED ORDER — INSULIN GLARGINE 100 UNIT/ML ~~LOC~~ SOLN: 15.0000 [IU] | Freq: Every day | SUBCUTANEOUS | Status: DC

## 2015-08-22 NOTE — Discharge Summary (Signed)
Physician Discharge Summary  Raymond Gordon ZOX:[id number] DOB: 02-06-1951 DOA: 08/13/2015  PCP: Gaspar Garbe, MD  Admit date: 08/13/2015 Discharge date: 08/22/2015  Time spent: 35 minutes  Recommendations for Outpatient Follow-up:  1. BNP, BMP 1 week 2. Daily weights 3. Outpatient urology follow up for foley management 4. CPAP 5. PRN O2  Discharge Diagnoses:  Principal Problem:   Acute diastolic CHF (congestive heart failure) Active Problems:   Essential hypertension   Hyperlipidemia   Severe OSA (obstructive sleep apnea)   Acute respiratory failure with hypoxia   Uncontrolled diabetes mellitus   Atrial fibrillation   Bacteriuria, asymptomatic   Discharge Condition: improved  Diet recommendation: carb mod/cardiac  Filed Weights   08/20/15 0559 08/21/15 0500 08/22/15 0500  Weight: 116.393 kg (256 lb 9.6 oz) 116.212 kg (256 lb 3.2 oz) 114.261 kg (251 lb 14.4 oz)    History of present illness:  Raymond Gordon is a 64 y.o. male with a history of Diastolic CHF, EF =81% on last 2D ECHO 06/2015, HTN, PAF on Pradxaa Rx, DM2 and OSA who presents to the ED with complaints of worsening SOB, DOE and orthopnea since last night. He also reports having increasing lower leg edema x 5 days. He denies Chest Pain. EMS was called and he was found to have hypoxia with O2 sats of 89% And he was placed on 4 liters NCO2. In the ED he continue to have respiratory distress and his O2 was increased to a NRB at 15 liters and then he was placed on BiPAP. He had findings of CHF on chest X-Ray and was administered 40 mg of IV Lasix X 1.   Hospital Course:  Acute diastolic CHF (congestive heart failure) - Diuresed 21.4L and weight down 41lb. Creatinine normal.  -Continue coreg and Irbesartan -  Po lasix  BID. Recheck BMET in a week.  Essential hypertension  Hyperlipidemia - continue statin  Severe OSA (obstructive sleep apnea) - on CPAP   Acute respiratory failure with  hypoxia - PRN O2  Uncontrolled diabetes mellitus - on home rx and SSI - A1c 8.7   Atrial fibrillation - maintaining SR  -Continue Pradaxa and coreg   Urinary retention -no symptoms currently but if develops would treat with: cipro -urology follow up for foley removal  Procedures:    Consultations:  cards  Discharge Exam: Filed Vitals:   08/22/15 0500  BP: 136/56  Pulse: 70  Temp: 99.1 F (37.3 C)  Resp: 20    General: awake, doing well   Discharge Instructions   Discharge Instructions    (HEART FAILURE PATIENTS) Call MD:  Anytime you have any of the following symptoms: 1) 3 pound weight gain in 24 hours or 5 pounds in 1 week 2) shortness of breath, with or without a dry hacking cough 3) swelling in the hands, feet or stomach 4) if you have to sleep on extra pillows at night in order to breathe.    Complete by:  As directed      Diet - low sodium heart healthy    Complete by:  As directed      Diet Carb Modified    Complete by:  As directed      Discharge instructions    Complete by:  As directed   BMET and BNP in 1 week Daily weights     Increase activity slowly    Complete by:  As directed           Current Discharge Medication  List    START taking these medications   Details  !! insulin aspart (NOVOLOG) 100 UNIT/ML injection Inject 0-5 Units into the skin at bedtime. Qty: 10 mL, Refills: 11    !! insulin aspart (NOVOLOG) 100 UNIT/ML injection Inject 0-15 Units into the skin 3 (three) times daily with meals. Qty: 10 mL, Refills: 11    !! insulin aspart (NOVOLOG) 100 UNIT/ML injection Inject 10 Units into the skin 3 (three) times daily with meals. Qty: 10 mL, Refills: 11    insulin glargine (LANTUS) 100 UNIT/ML injection Inject 0.15 mLs (15 Units total) into the skin daily. Qty: 10 mL, Refills: 11    isosorbide mononitrate (IMDUR) 30 MG 24 hr tablet Take 1 tablet (30 mg total) by mouth daily.    levalbuterol (XOPENEX) 0.63 MG/3ML nebulizer  solution Take 3 mLs (0.63 mg total) by nebulization every 8 (eight) hours as needed for wheezing or shortness of breath. Qty: 3 mL, Refills: 12     !! - Potential duplicate medications found. Please discuss with provider.    CONTINUE these medications which have CHANGED   Details  furosemide (LASIX) 80 MG tablet Take 1 tablet (80 mg total) by mouth 2 (two) times daily. Qty: 60 tablet, Refills: 0      CONTINUE these medications which have NOT CHANGED   Details  allopurinol (ZYLOPRIM) 300 MG tablet Take 300 mg by mouth daily.     atorvastatin (LIPITOR) 40 MG tablet Take 40 mg by mouth daily.     carvedilol (COREG) 25 MG tablet Take 1 tablet (25 mg total) by mouth 2 (two) times daily with a meal. Qty: 60 tablet, Refills: 6    dabigatran (PRADAXA) 150 MG CAPS Take 1 capsule (150 mg total) by mouth every 12 (twelve) hours. Qty: 60 capsule, Refills: 6    doxazosin (CARDURA) 4 MG tablet Take 4 mg by mouth daily.     Multiple Vitamin (MULTIVITAMIN WITH MINERALS) TABS tablet Take 1 tablet by mouth daily.    omeprazole (PRILOSEC OTC) 20 MG tablet Take 1 tablet (20 mg total) by mouth daily.    PRESCRIPTION MEDICATION Inhale into the lungs at bedtime. Pt is on C Pap machine    valsartan (DIOVAN) 160 MG tablet Take 160 mg by mouth daily.    tamsulosin (FLOMAX) 0.4 MG CAPS capsule Take 0.4 mg by mouth daily after supper.      STOP taking these medications     aspirin EC 81 MG tablet      colchicine 0.6 MG tablet      Dapagliflozin Propanediol (FARXIGA) 10 MG TABS      diltiazem (DILACOR XR) 120 MG 24 hr capsule      Insulin Lispro Prot & Lispro (HUMALOG MIX 75/25 KWIKPEN) (75-25) 100 UNIT/ML Kwikpen      ONE TOUCH ULTRA TEST test strip        No Known Allergies Follow-up Information    Follow up with HAGER, BRYAN, PA-C On 09/13/2015.   Specialties:  Physician Assistant, Radiology, Interventional Cardiology   Why:  :[facility]    Contact information:   [address] [name] [address] [phone]        The results of significant diagnostics from this hospitalization (including imaging, microbiology, ancillary and laboratory) are listed below for reference.    Significant Diagnostic Studies: Dg Chest Portable 1 View  08/13/2015   CLINICAL DATA:  Shortness of breath. History of hypertension, diabetes and atrial fibrillation.  EXAM: PORTABLE  CHEST - 1 VIEW  COMPARISON:  07/08/2015 and 06/25/2015.  FINDINGS: The heart appears mildly enlarged. There is vascular congestion with new perihilar and lower lobe airspace opacities most consistent with edema. There may be a small amount of pleural fluid bilaterally. No acute osseous findings are demonstrated. Telemetry leads overlie the chest.  IMPRESSION: New bilateral pulmonary opacities most consistent with edema and congestive heart failure. Possible small bilateral pleural effusions.   Electronically Signed   By: Carey Bullocks M.D.   On: 08/13/2015 19:58    Microbiology: Recent Results (from the past 240 hour(s))  Urine culture     Status: None   Collection Time: 08/19/15  7:55 PM  Result Value Ref Range Status   Specimen Description URINE, CATHETERIZED  Final   Special Requests NONE  Final   Culture >=100,000 COLONIES/mL ENTEROBACTER CLOACAE  Final   Report Status 08/22/2015 FINAL  Final   Organism ID, Bacteria ENTEROBACTER CLOACAE  Final      Susceptibility   Enterobacter cloacae - MIC*    CEFAZOLIN >=64 RESISTANT Resistant     CEFTRIAXONE <=1 SENSITIVE Sensitive     CIPROFLOXACIN <=0.25 SENSITIVE Sensitive     GENTAMICIN <=1 SENSITIVE Sensitive     IMIPENEM <=0.25 SENSITIVE Sensitive     NITROFURANTOIN 64 INTERMEDIATE Intermediate     TRIMETH/SULFA <=20 SENSITIVE Sensitive     PIP/TAZO 8 SENSITIVE Sensitive     * >=100,000 COLONIES/mL ENTEROBACTER CLOACAE     Labs: Basic Metabolic Panel:  Recent Labs Lab 08/17/15 0438 08/18/15 0909 08/19/15 0437 08/20/15 0554  08/21/15 0829  NA 137 138 135 137 135  K 3.8 3.8 3.4* 3.6 3.8  CL 103 97* 94* 96* 97*  CO2 24 31 33* 31 27  GLUCOSE 103* 205* 148* 137* 243*  BUN 18 17 21* 24* 25*  CREATININE 0.95 0.97 1.00 0.96 0.97  CALCIUM 9.4 9.5 8.9 9.3 9.1   Liver Function Tests: No results for input(s): AST, ALT, ALKPHOS, BILITOT, PROT, ALBUMIN in the last 168 hours. No results for input(s): LIPASE, AMYLASE in the last 168 hours. No results for input(s): AMMONIA in the last 168 hours. CBC:  Recent Labs Lab 08/17/15 0438 08/22/15 0350  WBC 6.8 5.7  HGB 11.5* 12.8*  HCT 35.5* 39.0  MCV 90.1 88.8  PLT 230 291   Cardiac Enzymes: No results for input(s): CKTOTAL, CKMB, CKMBINDEX, TROPONINI in the last 168 hours. BNP: BNP (last 3 results)  Recent Labs  07/08/15 0634 08/13/15 1935  BNP 74.8 250.8*    ProBNP (last 3 results) No results for input(s): PROBNP in the last 8760 hours.  CBG:  Recent Labs Lab 08/21/15 0720 08/21/15 1114 08/21/15 1614 08/21/15 2116 08/22/15 0727  GLUCAP 137* 217* 173* 119* 158*       Signed:  VANN, Raymond  [facility] 08/22/2015, 11:13 AM

## 2015-08-22 NOTE — Clinical Social Work Placement (Signed)
   CLINICAL SOCIAL WORK PLACEMENT  NOTE  Date:  08/22/2015  Patient Details  Name: Raymond Gordon MRN: [id number] Date of Birth: August 16, 1951  Clinical Social Work is seeking post-discharge placement for this patient at the Skilled  Nursing Facility level of care (*CSW will initial, date and re-position this form in  chart as items are completed):  Yes   Patient/family provided with [facility] Clinical Social Work Department's list of facilities offering this level of care within the geographic area requested by the patient (or if unable, by the patient's family).  Yes   Patient/family informed of their freedom to choose among providers that offer the needed level of care, that participate in Medicare, Medicaid or managed care program needed by the patient, have an available bed and are willing to accept the patient.  Yes   Patient/family informed of [facility]'s ownership interest in [facility] and [facility], as well as of the fact that they are under no obligation to receive care at these facilities.  PASRR submitted to EDS on 08/18/15     PASRR number received on 08/18/15     Existing PASRR number confirmed on       FL2 transmitted to all facilities in geographic area requested by pt/family on 08/18/15     FL2 transmitted to all facilities within larger geographic area on       Patient informed that his/her managed care company has contracts with or will negotiate with certain facilities, including the following:        Yes   Patient/family informed of bed offers received.  Patient chooses bed at [facility]     Physician recommends and patient chooses bed at      Patient to be transferred to [facility] on 08/22/15.  Patient to be transferred to facility by [facility]     Patient family notified on 08/22/15 of transfer.  Name of family member notified:  Toney Reil (mother)     PHYSICIAN       Additional CommentSharol Harness,  LCSW [phone]

## 2015-08-22 NOTE — Progress Notes (Signed)
Physical Therapy Treatment Patient Details Name: Raymond Gordon MRN: [id number] DOB: 1950-12-07 Today's Date: 08/22/2015    History of Present Illness Pt is a 64 y.o. male admitted 08/13/15 for SOB, DOE and CHF. PMH consists of a fib with  RVR, DM, PAF, HTN, CHF and BLE edema.    PT Comments    Pt was able to ambulate 150 feet with RW and supervision without supplemental O2. Pt's O2 sat remained at 100% and was left in chair with family present with room air. The RN was notified of the pt's O2 levels. Pt continues to progress towards goals. Pt will benefit from SNF level rehab for 24 hour supervision to improve mobility and allow eventual d/c to home.  Follow Up Recommendations  SNF;Supervision/Assistance - 24 hour     Equipment Recommendations  None recommended by PT    Recommendations for Other Services       Precautions / Restrictions Precautions Precautions: Fall    Mobility  Bed Mobility               General bed mobility comments: not assessed  Transfers Overall transfer level: Needs assistance Equipment used: Rolling walker (2 wheeled) Transfers: Sit to/from Stand Sit to Stand: Min guard         General transfer comment: Patient required vcs for hand placement and  to lean forward to avoid posterior LOB upon initiation of standing.  Ambulation/Gait Ambulation/Gait assistance: Supervision Ambulation Distance (Feet): 150 Feet Assistive device: Rolling walker (2 wheeled) Gait Pattern/deviations: Wide base of support;Decreased stride length;Step-through pattern Gait velocity: very slow Gait velocity interpretation: <1.8 ft/sec, indicative of risk for recurrent falls General Gait Details: Pt presented with inability to progress and clear either foot past the stance foot on swing phase.    Stairs            Wheelchair Mobility    Modified Rankin (Stroke Patients Only)       Balance Overall balance assessment: Needs assistance Sitting-balance  support: No upper extremity supported Sitting balance-Leahy Scale: Good     Standing balance support: Bilateral upper extremity supported Standing balance-Leahy Scale: Poor                      Cognition Arousal/Alertness: Awake/alert Behavior During Therapy: WFL for tasks assessed/performed Overall Cognitive Status: Within Functional Limits for tasks assessed                      Exercises General Exercises - Lower Extremity Ankle Circles/Pumps: AROM;Both;Other reps (comment) (3x10)    General Comments        Pertinent Vitals/Pain Pain Assessment: Faces Faces Pain Scale: Hurts a little bit Pain Location: Patient reports that his B ankles and feet feel better today.    Home Living                      Prior Function            PT Goals (current goals can now be found in the care plan section) Acute Rehab PT Goals Patient Stated Goal: not stated Time For Goal Achievement: 09/01/15 Potential to Achieve Goals: Good Progress towards PT goals: Progressing toward goals (Pt was able to meet two goals today. Sit to/from stand with minimal assist, and patient was able to ambulate 150 feet with supervision using RW)    Frequency  Min 3X/week    PT Plan Current plan remains appropriate    Co-evaluation  End of Session Equipment Utilized During Treatment: Gait belt Activity Tolerance: Patient tolerated treatment well (Pt rated RPE at 13/20 post ambulation) Patient left: in chair;with call bell/phone within reach;with family/visitor present     Time: 1100-1130 PT Time Calculation (min) (ACUTE ONLY): 30 min  Charges:  $Gait Training: 8-22 mins $Therapeutic Exercise: 8-22 mins                    G Codes:      WYNN,CYNDI 11-Sep-2015, 11:48 AM  Sheran Lawless, PT [phone] 11-Sep-2015

## 2015-08-22 NOTE — Progress Notes (Signed)
Patient Name: Raymond Gordon Date of Encounter: 08/22/2015  Principal Problem:   Acute diastolic CHF (congestive heart failure) Active Problems:   Essential hypertension   Hyperlipidemia   Severe OSA (obstructive sleep apnea)   Acute respiratory failure with hypoxia   Uncontrolled diabetes mellitus   Atrial fibrillation   Bacteriuria, asymptomatic    SUBJECTIVE  Feeling well. No chest pain, sob or palpitations. Plan to discharge to SNF.   CURRENT MEDS . allopurinol  300 mg Oral Daily  . atorvastatin  40 mg Oral q1800  . carvedilol  25 mg Oral BID WC  . dabigatran  150 mg Oral Q12H  . doxazosin  4 mg Oral Daily  . furosemide  60 mg Intravenous TID  . insulin aspart  0-15 Units Subcutaneous TID WC  . insulin aspart  0-5 Units Subcutaneous QHS  . insulin aspart  10 Units Subcutaneous TID WC  . insulin glargine  15 Units Subcutaneous Daily  . irbesartan  150 mg Oral Daily  . isosorbide mononitrate  30 mg Oral Daily  . pantoprazole  40 mg Oral Daily  . sodium chloride  3 mL Intravenous Q12H  . tamsulosin  0.4 mg Oral QPC supper    OBJECTIVE  Filed Vitals:   08/21/15 1725 08/21/15 2121 08/21/15 2218 08/22/15 0500  BP: 117/44 121/47  136/56  Pulse:  66 62 70  Temp:  98 F (36.7 C)  99.1 F (37.3 C)  TempSrc:  Oral  Oral  Resp:  Height:      Weight:    251 lb 14.4 oz (114.261 kg)  SpO2:  98% 98% 99%    Intake/Output Summary (Last 24 hours) at 08/22/15 1037 Last data filed at 08/22/15 0804  Gross per 24 hour  Intake    600 ml  Output   2300 ml  Net  -1700 ml   Filed Weights   08/20/15 0559 08/21/15 0500 08/22/15 0500  Weight: 256 lb 9.6 oz (116.393 kg) 256 lb 3.2 oz (116.212 kg) 251 lb 14.4 oz (114.261 kg)    PHYSICAL EXAM  General: Pleasant, NAD. Neuro: Alert and oriented X 3. Moves all extremities spontaneously. Psych: Normal affect. HEENT:  Normal  Neck: Supple without bruits. JVD unable to be assessed due to body habitus. Lungs:  Resp  regular and unlabored. Diminished breath sounds at bases. Heart: RRR no s3, s4, or murmurs. Abdomen: Soft, non-tender, non-distended, BS + x 4.  Extremities: No clubbing, cyanosis. Trace BL LE  edema. DP/PT/Radials 2+ and equal bilaterally.  Accessory Clinical Findings  CBC  Recent Labs  08/22/15 0350  WBC 5.7  HGB 12.8*  HCT 39.0  MCV 88.8  PLT 291   Basic Metabolic Panel  Recent Labs  08/20/15 0554 08/21/15 0829  NA 137 135  K 3.6 3.8  CL 96* 97*  CO2 31 27  GLUCOSE 137* 243*  BUN 24* 25*  CREATININE 0.96 0.97  CALCIUM 9.3 9.1   TELE  Sinus bradycardia  Radiology/Studies  Dg Chest Portable 1 View  08/13/2015   CLINICAL DATA:  Shortness of breath. History of hypertension, diabetes and atrial fibrillation.  EXAM: PORTABLE CHEST - 1 VIEW  COMPARISON:  07/08/2015 and 06/25/2015.  FINDINGS: The heart appears mildly enlarged. There is vascular congestion with new perihilar and lower lobe airspace opacities most consistent with edema. There may be a small amount of pleural fluid bilaterally. No acute osseous findings are demonstrated. Telemetry leads overlie the chest.  IMPRESSION: New  bilateral pulmonary opacities most consistent with edema and congestive heart failure. Possible small bilateral pleural effusions.   Electronically Signed   By: Carey Bullocks M.D.   On: 08/13/2015 19:58    ASSESSMENT AND PLAN  1. Acute diastolic CHF (congestive heart failure) - Diuresed 1.9 L/21.4L and weight down 5lb/41lb. Creatinine normal.  -Continue coreg and Irbesartan - Currently on IV lasix  TID. Switch to Po lasix  BID. Recheck BMET in a week.  Plan to discharge to SNF today.  Of to discharge on cardiac stand point. Primary cardiologist Dr. Allyson Sabal. Needs 2 weeks post hospital appointment once discharge from SNF.   2. Essential hypertension - Relatively stable. Low diastolic BP.   3. Hyperlipidemia - continue statin  4. Severe OSA (obstructive sleep apnea) - on CPAP  at home and here   5. Acute respiratory failure with hypoxia - improve w/ diuresis. Sitting up in chair. Recommended SNF.   6. Uncontrolled diabetes mellitus - on home rx and SSI - A1c 8.7  7. Atrial fibrillation - maintaining SR  -Continue Pradaxa and coreg  8. Urinary retention and UTI - per primary.  Signed, Bhagat,Bhavinkumar PA-C   Agree with note by Chelsea Aus PA-C  D/C to SNF today apparently. Excellent diuresis (21 Liters since admission). Transition to lasix 80 mg PO BID. Will arrange OP F/U. Will also need BMET and BNP as OP 1 week.    Runell Gess, M.D., FACP, [facility], [facility] [address] [place], [name]  [address]  [phone] 08/22/2015 10:54 AM
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
## 2015-08-22 NOTE — Progress Notes (Signed)
CSW (Clinical Social Worker) prepared pt dc packet and placed with shadow chart. CSW arranged non-emergent ambulance transport at 1:30pm. Pt, pt family, pt nurse, and facility informed. CSW signing off.  Poonum Ambelal, LCSW [phone]  

## 2015-08-24 ENCOUNTER — Encounter: Payer: Self-pay | Admitting: Cardiovascular Disease

## 2015-08-24 DIAGNOSIS — M6281 Muscle weakness (generalized): Secondary | ICD-10-CM | POA: Diagnosis not present

## 2015-08-24 DIAGNOSIS — R5381 Other malaise: Secondary | ICD-10-CM | POA: Diagnosis not present

## 2015-08-24 DIAGNOSIS — M79669 Pain in unspecified lower leg: Secondary | ICD-10-CM | POA: Diagnosis not present

## 2015-08-24 DIAGNOSIS — R2689 Other abnormalities of gait and mobility: Secondary | ICD-10-CM | POA: Diagnosis not present

## 2015-08-28 DIAGNOSIS — R2689 Other abnormalities of gait and mobility: Secondary | ICD-10-CM | POA: Diagnosis not present

## 2015-08-28 DIAGNOSIS — M79669 Pain in unspecified lower leg: Secondary | ICD-10-CM | POA: Diagnosis not present

## 2015-08-28 DIAGNOSIS — R5381 Other malaise: Secondary | ICD-10-CM | POA: Diagnosis not present

## 2015-08-28 DIAGNOSIS — M6281 Muscle weakness (generalized): Secondary | ICD-10-CM | POA: Diagnosis not present

## 2015-08-29 ENCOUNTER — Encounter: Payer: Self-pay | Admitting: Cardiovascular Disease

## 2015-08-29 DIAGNOSIS — E119 Type 2 diabetes mellitus without complications: Secondary | ICD-10-CM | POA: Diagnosis not present

## 2015-08-30 ENCOUNTER — Encounter: Payer: Self-pay | Admitting: Cardiovascular Disease

## 2015-09-01 ENCOUNTER — Telehealth: Payer: Self-pay | Admitting: *Deleted

## 2015-09-01 NOTE — Telephone Encounter (Signed)
Faxed order for CPAP mask prescribed on 07/14/15 to [facility].

## 2015-09-05 ENCOUNTER — Ambulatory Visit: Payer: 59 | Admitting: Podiatry

## 2015-09-06 DIAGNOSIS — M6281 Muscle weakness (generalized): Secondary | ICD-10-CM | POA: Diagnosis not present

## 2015-09-06 DIAGNOSIS — R2689 Other abnormalities of gait and mobility: Secondary | ICD-10-CM | POA: Diagnosis not present

## 2015-09-08 DIAGNOSIS — E785 Hyperlipidemia, unspecified: Secondary | ICD-10-CM | POA: Diagnosis not present

## 2015-09-08 DIAGNOSIS — I480 Paroxysmal atrial fibrillation: Secondary | ICD-10-CM | POA: Diagnosis not present

## 2015-09-08 DIAGNOSIS — I5033 Acute on chronic diastolic (congestive) heart failure: Secondary | ICD-10-CM | POA: Diagnosis not present

## 2015-09-08 DIAGNOSIS — G4733 Obstructive sleep apnea (adult) (pediatric): Secondary | ICD-10-CM | POA: Diagnosis not present

## 2015-09-08 DIAGNOSIS — E1165 Type 2 diabetes mellitus with hyperglycemia: Secondary | ICD-10-CM | POA: Diagnosis not present

## 2015-09-08 DIAGNOSIS — I10 Essential (primary) hypertension: Secondary | ICD-10-CM | POA: Diagnosis not present

## 2015-09-08 DIAGNOSIS — N401 Enlarged prostate with lower urinary tract symptoms: Secondary | ICD-10-CM | POA: Diagnosis not present

## 2015-09-11 DIAGNOSIS — I5033 Acute on chronic diastolic (congestive) heart failure: Secondary | ICD-10-CM | POA: Diagnosis not present

## 2015-09-11 DIAGNOSIS — Z960 Presence of urogenital implants: Secondary | ICD-10-CM | POA: Diagnosis not present

## 2015-09-11 DIAGNOSIS — E1165 Type 2 diabetes mellitus with hyperglycemia: Secondary | ICD-10-CM | POA: Diagnosis not present

## 2015-09-11 DIAGNOSIS — Z794 Long term (current) use of insulin: Secondary | ICD-10-CM | POA: Diagnosis not present

## 2015-09-11 DIAGNOSIS — N401 Enlarged prostate with lower urinary tract symptoms: Secondary | ICD-10-CM | POA: Diagnosis not present

## 2015-09-11 DIAGNOSIS — Z9181 History of falling: Secondary | ICD-10-CM | POA: Diagnosis not present

## 2015-09-11 DIAGNOSIS — R338 Other retention of urine: Secondary | ICD-10-CM | POA: Diagnosis not present

## 2015-09-11 DIAGNOSIS — I10 Essential (primary) hypertension: Secondary | ICD-10-CM | POA: Diagnosis not present

## 2015-09-11 DIAGNOSIS — M1A9XX0 Chronic gout, unspecified, without tophus (tophi): Secondary | ICD-10-CM | POA: Diagnosis not present

## 2015-09-11 DIAGNOSIS — I480 Paroxysmal atrial fibrillation: Secondary | ICD-10-CM | POA: Diagnosis not present

## 2015-09-12 DIAGNOSIS — I480 Paroxysmal atrial fibrillation: Secondary | ICD-10-CM | POA: Diagnosis not present

## 2015-09-12 DIAGNOSIS — H3500 Unspecified background retinopathy: Secondary | ICD-10-CM | POA: Diagnosis not present

## 2015-09-12 DIAGNOSIS — R809 Proteinuria, unspecified: Secondary | ICD-10-CM | POA: Diagnosis not present

## 2015-09-12 DIAGNOSIS — E785 Hyperlipidemia, unspecified: Secondary | ICD-10-CM | POA: Diagnosis not present

## 2015-09-12 DIAGNOSIS — N419 Inflammatory disease of prostate, unspecified: Secondary | ICD-10-CM | POA: Diagnosis not present

## 2015-09-12 DIAGNOSIS — I517 Cardiomegaly: Secondary | ICD-10-CM | POA: Diagnosis not present

## 2015-09-12 DIAGNOSIS — R339 Retention of urine, unspecified: Secondary | ICD-10-CM | POA: Diagnosis not present

## 2015-09-12 DIAGNOSIS — M109 Gout, unspecified: Secondary | ICD-10-CM | POA: Diagnosis not present

## 2015-09-12 DIAGNOSIS — G4733 Obstructive sleep apnea (adult) (pediatric): Secondary | ICD-10-CM | POA: Diagnosis not present

## 2015-09-12 DIAGNOSIS — I209 Angina pectoris, unspecified: Secondary | ICD-10-CM | POA: Insufficient documentation

## 2015-09-12 DIAGNOSIS — E1129 Type 2 diabetes mellitus with other diabetic kidney complication: Secondary | ICD-10-CM | POA: Diagnosis not present

## 2015-09-12 DIAGNOSIS — Z6841 Body Mass Index (BMI) 40.0 and over, adult: Secondary | ICD-10-CM | POA: Diagnosis not present

## 2015-09-12 DIAGNOSIS — Z1389 Encounter for screening for other disorder: Secondary | ICD-10-CM | POA: Diagnosis not present

## 2015-09-13 ENCOUNTER — Ambulatory Visit (INDEPENDENT_AMBULATORY_CARE_PROVIDER_SITE_OTHER): Payer: Medicare Other | Admitting: Physician Assistant

## 2015-09-13 ENCOUNTER — Encounter: Payer: Self-pay | Admitting: Physician Assistant

## 2015-09-13 VITALS — BP 126/54 | HR 66 | Ht 65.0 in | Wt 256.6 lb

## 2015-09-13 DIAGNOSIS — I480 Paroxysmal atrial fibrillation: Secondary | ICD-10-CM

## 2015-09-13 DIAGNOSIS — Z79899 Other long term (current) drug therapy: Secondary | ICD-10-CM

## 2015-09-13 DIAGNOSIS — I5032 Chronic diastolic (congestive) heart failure: Secondary | ICD-10-CM | POA: Insufficient documentation

## 2015-09-13 DIAGNOSIS — E785 Hyperlipidemia, unspecified: Secondary | ICD-10-CM

## 2015-09-13 DIAGNOSIS — I10 Essential (primary) hypertension: Secondary | ICD-10-CM

## 2015-09-13 NOTE — Patient Instructions (Addendum)
   Your physician recommends that you schedule a follow-up appointment in: 3 months with Dr. Allyson SabalBerry.  If you need a refill on your cardiac medications before your next appointment, please call your pharmacy.

## 2015-09-13 NOTE — Progress Notes (Signed)
Patient ID: Raymond Gordon, male   DOB: [birth year], 64 y.o.   MRN: [id number]    Date:  09/13/2015   ID:  Raymond Gordon, DOB 1951/01/07, MRN [id number]  PCP:  Gaspar Garbe, MD  Primary Cardiologist:   Allyson Sabal  Chief Complaint  Patient presents with  . Hospitalization Follow-up    pt c/o no chest pain or SOB no dizziness  . Edema    pt c/o stilling having some swelling in both feet     History of Present Illness:  The patient is an obese 64yo male with a history of PAF, HTN, HLD, HTN, HLD, DM. 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 He has a remote history of tobacco abuse having quit smoking back in 1999 and had smoked 1 pack per day. His father did have an MI and had bypass surgery. He was admitted to [facility] July 17, 2011, with chest pain and A-fib. He was catheterized by Dr. Italy Hilty on July 18, 2011, revealing minimal luminal irregularities with small vessel disease and normal LV function. He ultimately spontaneously converted to sinus rhythm and was placed on Pradaxa because of a CHADS2 score of 2. He does give symptoms compatible with obstructive sleep apnea and currently wears CPAP with benefit. He was admitted to [facility] 07/08/15 with A. Fib with RVR and chest pain. His enzymes were negative. He is placed in IV due to diltiazem and converted spontaneously to sinus rhythm. His amlodipine was changed to diltiazem. Most recent echocardiogram was 07/09/2015 revealed an ejection fraction of 65-70% with normal wall motion and grade 2 diastolic dysfunction. Left atrium was mildly to moderately dilated. The patient was recently hospitalized and discharged on September 27 with acute diastolic heart failure.  He diuresed 21 L and was discharged on by mouth Lasix.  He is here for follow-up.  He reports doing well with no shortness of breath orthopnea or PND. Still has a little bit of lower extremity edema.   He said his  weight yesterday was 252 pounds and today is 250 pounds.   He also denies nausea, vomiting, fever, chest pain, dizziness, cough, congestion, abdominal pain, hematochezia, melena.   He's been trying to adhere to a low-sdium diet.  Wt Readings from Last 3 Encounters:  09/13/15 256 lb 9.6 oz (116.393 kg)  08/22/15 251 lb 14.4 oz (114.261 kg)  07/26/15 280 lb (127.007 kg)     Past Medical History  Diagnosis Date  . Paroxysmal atrial fibrillation (HCC)   . Hypertension   . Hyperlipidemia   . Obesity   . Dysrhythmia   . Obesity   . Bilateral lower extremity edema   . Acute heart failure (HCC) 08/15/2015  . Diabetes (HCC)     INSULIN DEPENDENT  . Sleep apnea     Current Outpatient Prescriptions  Medication Sig Dispense Refill  . allopurinol (ZYLOPRIM) 300 MG tablet Take 300 mg by mouth daily.     Marland Kitchen atorvastatin (LIPITOR) 40 MG tablet Take 40 mg by mouth daily.     . carvedilol (COREG) 25 MG tablet Take 1 tablet (25 mg total) by mouth 2 (two) times daily with a meal. 60 tablet 6  . dabigatran (PRADAXA) 150 MG CAPS Take 1 capsule (150 mg total) by mouth every 12 (twelve) hours. 60 capsule 6  . doxazosin (CARDURA) 4 MG tablet Take 4 mg by mouth daily.     . furosemide (LASIX) 80  MG tablet Take 1 tablet (80 mg total) by mouth 2 (two) times daily. 60 tablet 0  . insulin lispro protamine-lispro (HUMALOG 75/25 MIX) (75-25) 100 UNIT/ML SUSP injection Inject 30-60 Units into the skin 3 (three) times daily.    . isosorbide mononitrate (IMDUR) 30 MG 24 hr tablet Take 1 tablet (30 mg total) by mouth daily.    . Multiple Vitamin (MULTIVITAMIN WITH MINERALS) TABS tablet Take 1 tablet by mouth daily.    Marland Kitchen omeprazole (PRILOSEC OTC) 20 MG tablet Take 1 tablet (20 mg total) by mouth daily.    Marland Kitchen PRESCRIPTION MEDICATION Inhale into the lungs at bedtime. Pt is on C Pap machine    . tamsulosin (FLOMAX) 0.4 MG CAPS capsule Take 0.4 mg by mouth daily after supper.    . valsartan (DIOVAN) 160 MG tablet Take 160  mg by mouth daily.     No current facility-administered medications for this visit.    Allergies:   No Known Allergies  Social History:  The patient  reports that he quit smoking about 17 years ago. He quit smokeless tobacco use about 2 months ago. He reports that he does not drink alcohol or use illicit drugs.   Family history:   Family History  Problem Relation Age of Onset  . Heart failure Brother   . Hyperlipidemia Brother   . Hypertension Brother   . Stroke Brother   . Hypertension Mother   . Hyperlipidemia Mother   . Diabetes Mother   . Cancer Father     prostate  . GER disease Father   . Stroke Father   . Hypertension Sister   . Hyperlipidemia Sister   . Hypertension Maternal Grandmother   . Stroke Maternal Grandmother   . Diabetes Maternal Grandfather   . Hypertension Maternal Grandfather     ROS:  Please see the history of present illness.  All other systems reviewed and negative.   PHYSICAL EXAM: VS:  BP 126/54 mmHg  Pulse 66  Ht  (1.651 m)  Wt 256 lb 9.6 oz (116.393 kg)  BMI 42.70 kg/m2  obese, well developed, in no acute distress HEENT: Pupils are equal round react to light accommodation extraocular movements are intact.  Neck: no JVDNo cervical lymphadenopathy. Cardiac: Regular rate and rhythm without murmurs rubs or gallops. Lungs:  clear to auscultation bilaterally, no wheezing, rhonchi or rales Abd: soft, nontender, positive bowel sounds all quadrants, no hepatosplenomegaly Ext:  1+ lower extremity edema.  2+ radial and dorsalis pedis pulses. Skin: warm and dry Neuro:  Grossly normal    ASSESSMENT AND PLAN:  Problem List Items Addressed This Visit    Paroxysmal atrial fibrillation (HCC)   Morbid obesity (HCC)   Relevant Medications   insulin lispro protamine-lispro (HUMALOG 75/25 MIX) (75-25) 100 UNIT/ML SUSP injection   Hyperlipidemia   Essential hypertension   Chronic diastolic heart failure (HCC)    Other Visit Diagnoses    Drug  therapy    -  Primary    Relevant Orders    Basic metabolic panel        proximal atrial fibrillation:  His irregular rhythm on exam.  continue Pradaxa  and Coreg.  Chronic diastolic heart failure:   His weight appears stable. It is actually decreased 2 pounds on his home scale from yesterday to today.  He had a basic metabolic panel yesterday and his creatinine was 1.0 the BUN of 24.   Continue current Lasix dose..    hyperlipidemia:  Continue statin   Essential hypertension: blood pressure controlled no changes.   morbid obesity.   Weight loss encouraged.

## 2015-09-14 DIAGNOSIS — I480 Paroxysmal atrial fibrillation: Secondary | ICD-10-CM | POA: Diagnosis not present

## 2015-09-14 DIAGNOSIS — N401 Enlarged prostate with lower urinary tract symptoms: Secondary | ICD-10-CM | POA: Diagnosis not present

## 2015-09-14 DIAGNOSIS — E1165 Type 2 diabetes mellitus with hyperglycemia: Secondary | ICD-10-CM | POA: Diagnosis not present

## 2015-09-14 DIAGNOSIS — M1A9XX0 Chronic gout, unspecified, without tophus (tophi): Secondary | ICD-10-CM | POA: Diagnosis not present

## 2015-09-14 DIAGNOSIS — I10 Essential (primary) hypertension: Secondary | ICD-10-CM | POA: Diagnosis not present

## 2015-09-14 DIAGNOSIS — I5033 Acute on chronic diastolic (congestive) heart failure: Secondary | ICD-10-CM | POA: Diagnosis not present

## 2015-09-15 DIAGNOSIS — N401 Enlarged prostate with lower urinary tract symptoms: Secondary | ICD-10-CM | POA: Diagnosis not present

## 2015-09-15 DIAGNOSIS — R972 Elevated prostate specific antigen [PSA]: Secondary | ICD-10-CM | POA: Diagnosis not present

## 2015-09-15 DIAGNOSIS — N138 Other obstructive and reflux uropathy: Secondary | ICD-10-CM | POA: Diagnosis not present

## 2015-09-19 DIAGNOSIS — I480 Paroxysmal atrial fibrillation: Secondary | ICD-10-CM | POA: Diagnosis not present

## 2015-09-19 DIAGNOSIS — N401 Enlarged prostate with lower urinary tract symptoms: Secondary | ICD-10-CM | POA: Diagnosis not present

## 2015-09-19 DIAGNOSIS — M1A9XX0 Chronic gout, unspecified, without tophus (tophi): Secondary | ICD-10-CM | POA: Diagnosis not present

## 2015-09-19 DIAGNOSIS — E1165 Type 2 diabetes mellitus with hyperglycemia: Secondary | ICD-10-CM | POA: Diagnosis not present

## 2015-09-19 DIAGNOSIS — I5033 Acute on chronic diastolic (congestive) heart failure: Secondary | ICD-10-CM | POA: Diagnosis not present

## 2015-09-19 DIAGNOSIS — I10 Essential (primary) hypertension: Secondary | ICD-10-CM | POA: Diagnosis not present

## 2015-09-22 DIAGNOSIS — I10 Essential (primary) hypertension: Secondary | ICD-10-CM | POA: Diagnosis not present

## 2015-09-22 DIAGNOSIS — I480 Paroxysmal atrial fibrillation: Secondary | ICD-10-CM | POA: Diagnosis not present

## 2015-09-22 DIAGNOSIS — I5033 Acute on chronic diastolic (congestive) heart failure: Secondary | ICD-10-CM | POA: Diagnosis not present

## 2015-09-22 DIAGNOSIS — N401 Enlarged prostate with lower urinary tract symptoms: Secondary | ICD-10-CM | POA: Diagnosis not present

## 2015-09-22 DIAGNOSIS — M1A9XX0 Chronic gout, unspecified, without tophus (tophi): Secondary | ICD-10-CM | POA: Diagnosis not present

## 2015-09-22 DIAGNOSIS — E1165 Type 2 diabetes mellitus with hyperglycemia: Secondary | ICD-10-CM | POA: Diagnosis not present

## 2015-09-26 DIAGNOSIS — E1165 Type 2 diabetes mellitus with hyperglycemia: Secondary | ICD-10-CM | POA: Diagnosis not present

## 2015-09-26 DIAGNOSIS — I5033 Acute on chronic diastolic (congestive) heart failure: Secondary | ICD-10-CM | POA: Diagnosis not present

## 2015-09-26 DIAGNOSIS — N401 Enlarged prostate with lower urinary tract symptoms: Secondary | ICD-10-CM | POA: Diagnosis not present

## 2015-09-26 DIAGNOSIS — M1A9XX0 Chronic gout, unspecified, without tophus (tophi): Secondary | ICD-10-CM | POA: Diagnosis not present

## 2015-09-26 DIAGNOSIS — I480 Paroxysmal atrial fibrillation: Secondary | ICD-10-CM | POA: Diagnosis not present

## 2015-09-26 DIAGNOSIS — I10 Essential (primary) hypertension: Secondary | ICD-10-CM | POA: Diagnosis not present

## 2015-09-27 ENCOUNTER — Encounter: Payer: Self-pay | Admitting: Cardiovascular Disease

## 2015-09-27 DIAGNOSIS — E1129 Type 2 diabetes mellitus with other diabetic kidney complication: Secondary | ICD-10-CM | POA: Diagnosis not present

## 2015-09-27 DIAGNOSIS — I10 Essential (primary) hypertension: Secondary | ICD-10-CM | POA: Diagnosis not present

## 2015-09-28 DIAGNOSIS — I5033 Acute on chronic diastolic (congestive) heart failure: Secondary | ICD-10-CM | POA: Diagnosis not present

## 2015-09-28 DIAGNOSIS — M1A9XX0 Chronic gout, unspecified, without tophus (tophi): Secondary | ICD-10-CM | POA: Diagnosis not present

## 2015-09-28 DIAGNOSIS — I480 Paroxysmal atrial fibrillation: Secondary | ICD-10-CM | POA: Diagnosis not present

## 2015-09-28 DIAGNOSIS — E1165 Type 2 diabetes mellitus with hyperglycemia: Secondary | ICD-10-CM | POA: Diagnosis not present

## 2015-09-28 DIAGNOSIS — I10 Essential (primary) hypertension: Secondary | ICD-10-CM | POA: Diagnosis not present

## 2015-09-28 DIAGNOSIS — N401 Enlarged prostate with lower urinary tract symptoms: Secondary | ICD-10-CM | POA: Diagnosis not present

## 2015-10-03 DIAGNOSIS — I5033 Acute on chronic diastolic (congestive) heart failure: Secondary | ICD-10-CM | POA: Diagnosis not present

## 2015-10-03 DIAGNOSIS — M1A9XX0 Chronic gout, unspecified, without tophus (tophi): Secondary | ICD-10-CM | POA: Diagnosis not present

## 2015-10-03 DIAGNOSIS — I10 Essential (primary) hypertension: Secondary | ICD-10-CM | POA: Diagnosis not present

## 2015-10-03 DIAGNOSIS — I480 Paroxysmal atrial fibrillation: Secondary | ICD-10-CM | POA: Diagnosis not present

## 2015-10-03 DIAGNOSIS — E1165 Type 2 diabetes mellitus with hyperglycemia: Secondary | ICD-10-CM | POA: Diagnosis not present

## 2015-10-03 DIAGNOSIS — N401 Enlarged prostate with lower urinary tract symptoms: Secondary | ICD-10-CM | POA: Diagnosis not present

## 2015-10-05 DIAGNOSIS — E1165 Type 2 diabetes mellitus with hyperglycemia: Secondary | ICD-10-CM | POA: Diagnosis not present

## 2015-10-05 DIAGNOSIS — M1A9XX0 Chronic gout, unspecified, without tophus (tophi): Secondary | ICD-10-CM | POA: Diagnosis not present

## 2015-10-05 DIAGNOSIS — I10 Essential (primary) hypertension: Secondary | ICD-10-CM | POA: Diagnosis not present

## 2015-10-05 DIAGNOSIS — I480 Paroxysmal atrial fibrillation: Secondary | ICD-10-CM | POA: Diagnosis not present

## 2015-10-05 DIAGNOSIS — I5033 Acute on chronic diastolic (congestive) heart failure: Secondary | ICD-10-CM | POA: Diagnosis not present

## 2015-10-05 DIAGNOSIS — N401 Enlarged prostate with lower urinary tract symptoms: Secondary | ICD-10-CM | POA: Diagnosis not present

## 2015-10-10 DIAGNOSIS — M1A9XX0 Chronic gout, unspecified, without tophus (tophi): Secondary | ICD-10-CM | POA: Diagnosis not present

## 2015-10-10 DIAGNOSIS — I10 Essential (primary) hypertension: Secondary | ICD-10-CM | POA: Diagnosis not present

## 2015-10-10 DIAGNOSIS — E1165 Type 2 diabetes mellitus with hyperglycemia: Secondary | ICD-10-CM | POA: Diagnosis not present

## 2015-10-10 DIAGNOSIS — I5033 Acute on chronic diastolic (congestive) heart failure: Secondary | ICD-10-CM | POA: Diagnosis not present

## 2015-10-10 DIAGNOSIS — N401 Enlarged prostate with lower urinary tract symptoms: Secondary | ICD-10-CM | POA: Diagnosis not present

## 2015-10-10 DIAGNOSIS — I480 Paroxysmal atrial fibrillation: Secondary | ICD-10-CM | POA: Diagnosis not present

## 2015-10-11 ENCOUNTER — Ambulatory Visit (INDEPENDENT_AMBULATORY_CARE_PROVIDER_SITE_OTHER): Payer: Medicare Other | Admitting: Cardiovascular Disease

## 2015-10-11 VITALS — BP 166/72 | HR 73 | Ht 65.0 in | Wt 257.5 lb

## 2015-10-11 DIAGNOSIS — I10 Essential (primary) hypertension: Secondary | ICD-10-CM

## 2015-10-11 DIAGNOSIS — G4733 Obstructive sleep apnea (adult) (pediatric): Secondary | ICD-10-CM

## 2015-10-11 NOTE — Patient Instructions (Signed)
Your physician wants you to follow-up in: 1 year or sooner if needed with Dr. Kelly for sleep. You will receive a reminder letter in the mail two months in advance. If you don't receive a letter, please call our office to schedule the follow-up appointment. 

## 2015-10-13 ENCOUNTER — Encounter: Payer: Self-pay | Admitting: Cardiovascular Disease

## 2015-10-13 DIAGNOSIS — I480 Paroxysmal atrial fibrillation: Secondary | ICD-10-CM | POA: Diagnosis not present

## 2015-10-13 DIAGNOSIS — I10 Essential (primary) hypertension: Secondary | ICD-10-CM | POA: Diagnosis not present

## 2015-10-13 DIAGNOSIS — M1A9XX0 Chronic gout, unspecified, without tophus (tophi): Secondary | ICD-10-CM | POA: Diagnosis not present

## 2015-10-13 DIAGNOSIS — I5033 Acute on chronic diastolic (congestive) heart failure: Secondary | ICD-10-CM | POA: Diagnosis not present

## 2015-10-13 DIAGNOSIS — E1165 Type 2 diabetes mellitus with hyperglycemia: Secondary | ICD-10-CM | POA: Diagnosis not present

## 2015-10-13 DIAGNOSIS — N401 Enlarged prostate with lower urinary tract symptoms: Secondary | ICD-10-CM | POA: Diagnosis not present

## 2015-10-13 NOTE — Progress Notes (Signed)
Patient ID: STEWART SASAKI, male   DOB: [birth year], 64 y.o.   MRN: [id number]     HPI: GENNARO LIZOTTE is a 64 y.o. male who presents to the office today for a follow-up sleep clinic evaluation following initiation of CPAP therapy.  He is a patient of  Dr. Gwenlyn Found and Dr. Osborne Casco.  Mr. Sagar is a 64 year old African-American male who has a history of obesity, diabetes mellitus, hypertension, hyperlipidemia, and strong family history for coronary artery disease.  He has a history of chest pain and atrial fibrillation and cardiac catheterization in 2012 showed mild luminal irregularities with small vessel disease and normal LV function.  He has had issues with significant peripheral edema and hypertension.  Due to concerns for sleep apnea, he was referred for a diagnostic polysomnogram which was done at [facility] and interpreted by me on 11/23/2015.  This demonstrated severe sleep apnea with an AHI of 89.5.  He was unable to achieve any REM sleep.  He had significant oxygen desaturation to 72% with non-REM sleep.  There was moderate snoring.  He subsequently underwent a CPAP titration trial and he was titrated up to a 17 cm water pressure.  Central events commenced at a CPAP pressure of 18.   I saw him in July for initial evaluation following initiation of CPAP therapy with his ResMed AirSense 10 AutoSet unit.  A download  from 05/14/2015 through 06/12/2015 revealed 100% days of usage for which he is compliant.  However, he is not compliant with reference to percentdays greater than 4 hours and he apparently is only averaging one hour and 38 minutes per night.  Upon questioning he goes to bed at 11:30 PM and wakes up at 6:30.  His download suggests that he is having significant amount of leak.  He states that he oftentimes is going to the bathroom after several hours of sleep.  He takes his mask off and then puts it back on and he believes that CPAP is back on working.  Epworth sleepiness scale score was 14 prior  to initiation of therapy.   over the last several months, he has had significantly improved compliance with reference to CPAP use.  Since his last office visit. He now typically goes to bed at 1 AM and wakes up at 7.  A recent download from 07/14/2015 through 08/12/2015 reveals 100% of usage stays. He is now at 83% with days used greater than 4 hours. Review of his download indicates that he does have a significant leak which may still be accounting for his residual AHI of 9.7. His DME company is [employer]. He is set at a maximum pressure of 20 and minimum pressure of 16. An Epworth Sleepiness Scale score was recalculated in the office today and this did not resume reveal any residual hypersomnolence with a score of 6.  Past Medical History  Diagnosis Date  . Paroxysmal atrial fibrillation (HCC)   . Hypertension   . Hyperlipidemia   . Obesity   . Dysrhythmia   . Obesity   . Bilateral lower extremity edema   . Acute heart failure ([place]) 08/15/2015  . Diabetes ([place])     INSULIN DEPENDENT  . Sleep apnea     Past Surgical History  Procedure Laterality Date  . Cardiac catheterization    . Cardiac catheterization N/A 2012    no large vessel occlusions    No Known Allergies  Current Outpatient Prescriptions  Medication Sig Dispense Refill  . allopurinol (  ZYLOPRIM) 300 MG tablet Take 300 mg by mouth daily.     Marland Kitchen atorvastatin (LIPITOR) 40 MG tablet Take 40 mg by mouth daily.     . carvedilol (COREG) 25 MG tablet Take 1 tablet (25 mg total) by mouth 2 (two) times daily with a meal. 60 tablet 6  . dabigatran (PRADAXA) 150 MG CAPS Take 1 capsule (150 mg total) by mouth every 12 (twelve) hours. 60 capsule 6  . doxazosin (CARDURA) 4 MG tablet Take 4 mg by mouth daily.     . furosemide (LASIX) 80 MG tablet Take 1 tablet (80 mg total) by mouth 2 (two) times daily. 60 tablet 0  . insulin lispro protamine-lispro (HUMALOG 75/25 MIX) (75-25) 100 UNIT/ML SUSP injection Inject 30-60 Units into  the skin 3 (three) times daily.    . isosorbide mononitrate (IMDUR) 30 MG 24 hr tablet Take 1 tablet (30 mg total) by mouth daily.    . Multiple Vitamin (MULTIVITAMIN WITH MINERALS) TABS tablet Take 1 tablet by mouth daily.    Marland Kitchen omeprazole (PRILOSEC OTC) 20 MG tablet Take 1 tablet (20 mg total) by mouth daily.    Marland Kitchen PRESCRIPTION MEDICATION Inhale into the lungs at bedtime. Pt is on C Pap machine    . tamsulosin (FLOMAX) 0.4 MG CAPS capsule Take 0.4 mg by mouth daily after supper.    . valsartan (DIOVAN) 160 MG tablet Take 160 mg by mouth daily.     No current facility-administered medications for this visit.    Social History   Social History  . Marital Status: Single    Spouse Name: N/A  . Number of Children: N/A  . Years of Education: N/A   Occupational History  . Not on file.   Social History Main Topics  . Smoking status: Former Smoker    Quit date: 11/25/1997  . Smokeless tobacco: Former Systems developer    Quit date: 06/14/2015  . Alcohol Use: No  . Drug Use: No  . Sexual Activity: Not on file   Other Topics Concern  . Not on file   Social History Narrative    Family History  Problem Relation Age of Onset  . Heart failure Brother   . Hyperlipidemia Brother   . Hypertension Brother   . Stroke Brother   . Hypertension Mother   . Hyperlipidemia Mother   . Diabetes Mother   . Cancer Father     prostate  . GER disease Father   . Stroke Father   . Hypertension Sister   . Hyperlipidemia Sister   . Hypertension Maternal Grandmother   . Stroke Maternal Grandmother   . Diabetes Maternal Grandfather   . Hypertension Maternal Grandfather     ROS General: Negative; No fevers, chills, or night sweats HEENT: Negative; No changes in vision or hearing, sinus congestion, difficulty swallowing Pulmonary: Negative; No cough, wheezing, shortness of breath, hemoptysis Cardiovascular: See HPI: Positive for leg swelling.  No chest pain. GI: Negative; No nausea, vomiting, diarrhea, or  abdominal pain GU: Negative; No dysuria, hematuria, or difficulty voiding Musculoskeletal: Negative; no myalgias, joint pain, or weakness Hematologic: Negative; no easy bruising, bleeding Endocrine: Negative; no heat/cold intolerance; no diabetes, Neuro: Negative; no changes in balance, headaches Skin: Negative; No rashes or skin lesions Psychiatric: Negative; No behavioral problems, depression Sleep: Severe obstructive sleep apnea. Other comprehensive 14 point system review is negative   Physical Exam BP 166/72 mmHg  Pulse 73  Ht 5' 5"  (1.651 m)  Wt 257 lb 8 oz (116.801  kg)  BMI 42.85 kg/m2 Wt Readings from Last 3 Encounters:  10/11/15 257 lb 8 oz (116.801 kg)  09/13/15 256 lb 9.6 oz (116.393 kg)  08/22/15 251 lb 14.4 oz (114.261 kg)   General: Alert, oriented, no distress.  Skin: normal turgor, no rashes, warm and dry HEENT: Normocephalic, atraumatic. Pupils equal round and reactive to light; sclera anicteric; extraocular muscles intact, No lid lag; Nose without nasal septal hypertrophy; Mouth/Parynx benign; Mallinpatti scale 3/4 Neck: No JVD, no carotid bruits; normal carotid upstroke Lungs: clear to ausculatation and percussion bilaterally; no wheezing or rales, normal inspiratory and expiratory effort Chest wall: without tenderness to palpitation Heart: PMI not displaced, RRR, s1 s2 normal, 1/6 systolic murmur, No diastolic murmur, no rubs, gallops, thrills, or heaves Abdomen: soft, nontender; no hepatosplenomehaly, BS+; abdominal aorta nontender and not dilated by palpation. Back: no CVA tenderness Pulses: 2+  Musculoskeletal: full range of motion, normal strength, no joint deformities Extremities: Tense 3+ lower extremity edema to just below the knees., Homan's sign negative  Neurologic: grossly nonfocal; Cranial nerves grossly wnl Psychologic: Normal mood and affect   ECG (independently read by me): Not done today but previous ECG reviewed  LABS:  BMP Latest Ref Rng  08/21/2015 08/20/2015 08/19/2015  Glucose 65 - 99 mg/dL 243(H) 137(H) 148(H)  BUN 6 - 20 mg/dL 25(H) 24(H) 21(H)  Creatinine 0.61 - 1.24 mg/dL 0.97 0.96 1.00  Sodium 135 - 145 mmol/L 135 137 135  Potassium 3.5 - 5.1 mmol/L 3.8 3.6 3.4(L)  Chloride 101 - 111 mmol/L 97(L) 96(L) 94(L)  CO2 22 - 32 mmol/L 27 31 33(H)  Calcium 8.9 - 10.3 mg/dL 9.1 9.3 8.9     Hepatic Function Latest Ref Rng 05/21/2014 05/20/2014 07/17/2011  Total Protein 6.0 - 8.3 g/dL 7.3 7.8 7.9  Albumin 3.5 - 5.2 g/dL 3.1(L) 3.6 3.5  AST 0 - 37 U/L 24 30 16   ALT 0 - 53 U/L 21 24 20   Alk Phosphatase 39 - 117 U/L 89 99 83  Total Bilirubin 0.3 - 1.2 mg/dL 0.3 0.3 0.2(L)    CBC Latest Ref Rng 08/22/2015 08/17/2015 08/13/2015  WBC 4.0 - 10.5 K/uL 5.7 6.8 6.3  Hemoglobin 13.0 - 17.0 g/dL 12.8(L) 11.5(L) 11.8(L)  Hematocrit 39.0 - 52.0 % 39.0 35.5(L) 36.3(L)  Platelets 150 - 400 K/uL 291 230 240   Lab Results  Component Value Date   MCV 88.8 08/22/2015   MCV 90.1 08/17/2015   MCV 89.6 08/13/2015    Lab Results  Component Value Date   TSH 1.920 07/17/2011    BNP    Component Value Date/Time   BNP 250.8* 08/13/2015 1935    ProBNP    Component Value Date/Time   PROBNP 302.3* 05/20/2014 1309     Lipid Panel     Component Value Date/Time   CHOL 229* 07/18/2011 0345   TRIG 101 07/18/2011 0345   HDL 46 07/18/2011 0345   CHOLHDL 5.0 07/18/2011 0345   VLDL 20 07/18/2011 0345   LDLCALC 163* 07/18/2011 0345     RADIOLOGY: No results found.    ASSESSMENT AND PLAN: Mr. Nijah Orlich is a 64 year old Afro-American gentleman with morbid obesity and a body mass index of 46.23.  He has a history of hypertension, diabetes mellitus, hyperlipidemia, family history for CAD, as well as a remote history of paroxysmal atrial fibrillation.  He has been maintaining sinus rhythm.  Since I last saw him, he has significantly improved his compliance and is now meeting Medicare compliance standards.  He does not have any residual  daytime sleepiness.  He is unaware of any bruxism or breakthrough snoring. His initial study had significant oxygen desaturation.  He has benefited from changing his unit to an auto mode.  When I had seen him previously I had started him on furosemide for his significant edema and this has improved.  His DME company is [employer].  With his leak, I have recommended improved cleansing of his mask and particularly the cushion with soap and water daily. If he continues to have significant leak, he may require a new mask for optimal treatment. He is not having any recurrent chest pain.  He continues to have morbid obesity with a BMI of 42.85 kg/m.  Weight loss and increased exercise was recommended. He will follow-up with Dr. Gwenlyn Found for his Cardiologic care.  I will see him in one year for sleep reevaluation.  Time spent: 25 minutes  Troy Sine, MD, [facility]  10/13/2015 9:45 PM

## 2015-10-16 ENCOUNTER — Telehealth: Payer: Self-pay | Admitting: Cardiovascular Disease

## 2015-10-16 DIAGNOSIS — I10 Essential (primary) hypertension: Secondary | ICD-10-CM | POA: Diagnosis not present

## 2015-10-16 DIAGNOSIS — E1165 Type 2 diabetes mellitus with hyperglycemia: Secondary | ICD-10-CM | POA: Diagnosis not present

## 2015-10-16 DIAGNOSIS — M1A9XX0 Chronic gout, unspecified, without tophus (tophi): Secondary | ICD-10-CM | POA: Diagnosis not present

## 2015-10-16 DIAGNOSIS — N401 Enlarged prostate with lower urinary tract symptoms: Secondary | ICD-10-CM | POA: Diagnosis not present

## 2015-10-16 DIAGNOSIS — I5033 Acute on chronic diastolic (congestive) heart failure: Secondary | ICD-10-CM | POA: Diagnosis not present

## 2015-10-16 DIAGNOSIS — I480 Paroxysmal atrial fibrillation: Secondary | ICD-10-CM | POA: Diagnosis not present

## 2015-10-16 NOTE — Telephone Encounter (Signed)
Please call, concerning his Pradaxa. °

## 2015-10-16 NOTE — Telephone Encounter (Signed)
Called pt, he needed PA for Pradaxa. Checked for samples here and [facility] none at either location. I called his insurer, worked through [employer] over the phone, pt approved for Pradaxa 150mg  BID until 10/15/2016.  Returned call to patient informing him of this & that pharmacy could fill.

## 2015-10-18 DIAGNOSIS — E1165 Type 2 diabetes mellitus with hyperglycemia: Secondary | ICD-10-CM | POA: Diagnosis not present

## 2015-10-18 DIAGNOSIS — N401 Enlarged prostate with lower urinary tract symptoms: Secondary | ICD-10-CM | POA: Diagnosis not present

## 2015-10-18 DIAGNOSIS — I5033 Acute on chronic diastolic (congestive) heart failure: Secondary | ICD-10-CM | POA: Diagnosis not present

## 2015-10-18 DIAGNOSIS — I10 Essential (primary) hypertension: Secondary | ICD-10-CM | POA: Diagnosis not present

## 2015-10-18 DIAGNOSIS — I480 Paroxysmal atrial fibrillation: Secondary | ICD-10-CM | POA: Diagnosis not present

## 2015-10-18 DIAGNOSIS — M1A9XX0 Chronic gout, unspecified, without tophus (tophi): Secondary | ICD-10-CM | POA: Diagnosis not present

## 2015-10-23 ENCOUNTER — Telehealth: Payer: Self-pay | Admitting: Cardiovascular Disease

## 2015-10-23 DIAGNOSIS — N401 Enlarged prostate with lower urinary tract symptoms: Secondary | ICD-10-CM | POA: Diagnosis not present

## 2015-10-23 DIAGNOSIS — I5033 Acute on chronic diastolic (congestive) heart failure: Secondary | ICD-10-CM | POA: Diagnosis not present

## 2015-10-23 DIAGNOSIS — M1A9XX0 Chronic gout, unspecified, without tophus (tophi): Secondary | ICD-10-CM | POA: Diagnosis not present

## 2015-10-23 DIAGNOSIS — Z9189 Other specified personal risk factors, not elsewhere classified: Secondary | ICD-10-CM

## 2015-10-23 DIAGNOSIS — E1165 Type 2 diabetes mellitus with hyperglycemia: Secondary | ICD-10-CM | POA: Diagnosis not present

## 2015-10-23 DIAGNOSIS — I10 Essential (primary) hypertension: Secondary | ICD-10-CM | POA: Diagnosis not present

## 2015-10-23 DIAGNOSIS — I480 Paroxysmal atrial fibrillation: Secondary | ICD-10-CM | POA: Diagnosis not present

## 2015-10-23 NOTE — Telephone Encounter (Signed)
Calling because Raymond Gordon was going to refer him to a dentist office and the office that she called said that they need a referral . [facility] [phone].. [facility]   Thanks

## 2015-10-23 NOTE — Telephone Encounter (Signed)
Spoke with ms robertson, aware referral order placed. Will send to admin for scheduling

## 2015-10-26 DIAGNOSIS — N401 Enlarged prostate with lower urinary tract symptoms: Secondary | ICD-10-CM | POA: Diagnosis not present

## 2015-10-26 DIAGNOSIS — E1165 Type 2 diabetes mellitus with hyperglycemia: Secondary | ICD-10-CM | POA: Diagnosis not present

## 2015-10-26 DIAGNOSIS — I480 Paroxysmal atrial fibrillation: Secondary | ICD-10-CM | POA: Diagnosis not present

## 2015-10-26 DIAGNOSIS — I10 Essential (primary) hypertension: Secondary | ICD-10-CM | POA: Diagnosis not present

## 2015-10-26 DIAGNOSIS — I5033 Acute on chronic diastolic (congestive) heart failure: Secondary | ICD-10-CM | POA: Diagnosis not present

## 2015-10-26 DIAGNOSIS — M1A9XX0 Chronic gout, unspecified, without tophus (tophi): Secondary | ICD-10-CM | POA: Diagnosis not present

## 2015-10-27 ENCOUNTER — Ambulatory Visit (INDEPENDENT_AMBULATORY_CARE_PROVIDER_SITE_OTHER): Payer: Medicare Other | Admitting: Podiatry

## 2015-10-27 DIAGNOSIS — M79676 Pain in unspecified toe(s): Secondary | ICD-10-CM | POA: Diagnosis not present

## 2015-10-27 DIAGNOSIS — E1151 Type 2 diabetes mellitus with diabetic peripheral angiopathy without gangrene: Secondary | ICD-10-CM | POA: Diagnosis not present

## 2015-10-27 DIAGNOSIS — L03031 Cellulitis of right toe: Secondary | ICD-10-CM

## 2015-10-27 DIAGNOSIS — B351 Tinea unguium: Secondary | ICD-10-CM | POA: Diagnosis not present

## 2015-10-27 DIAGNOSIS — L600 Ingrowing nail: Secondary | ICD-10-CM | POA: Diagnosis not present

## 2015-10-27 MED ORDER — CEPHALEXIN 500 MG PO CAPS: 500.0000 mg | ORAL_CAPSULE | Freq: Four times a day (QID) | ORAL | Status: DC

## 2015-10-27 NOTE — Progress Notes (Signed)
Patient ID: Enes O Barrilleaux, male   DOBRichardson Landry: 11/19/1951, 64 y.o.   MRN: [id number] Complaint:  Visit Type: Patient returns to my office for continued preventative foot care services. Complaint: Patient states" my nails have grown long and thick and become painful to walk and wear shoes" Patient has been diagnosed with DM with no angiopathy due to his swelling.. The patient presents for preventative foot care services. No changes to ROS  Podiatric Exam: Vascular: dorsalis pedis and posterior tibial pulses are not  palpable bilateral due to his swelling. Capillary return is immediate. Temperature gradient is WNL. Skin turgor WNL  Sensorium: Normal Semmes Weinstein monofilament test. Normal tactile sensation bilaterally. Nail Exam: Pt has thick disfigured discolored nails with subungual debris noted bilateral entire nail hallux through fifth toenails.  There is marked incurvation along the lateral border right great toe.  Granulation tissue noted. Ulcer Exam: There is no evidence of ulcer or pre-ulcerative changes or infection. Orthopedic Exam: Muscle tone and strength are WNL. No limitations in general ROM. No crepitus or effusions noted. Foot type and digits show no abnormalities. Bony prominences are unremarkable. Skin: No Porokeratosis. No infection or ulcers  Diagnosis:  Onychomycosis, , Pain in right toe, pain in left toes,  Paronychia right hallux.  Treatment & Plan Procedures and Treatment: Consent by patient was obtained for treatment procedures. The patient understood the discussion of treatment and procedures well. All questions were answered thoroughly reviewed. Debridement of mycotic and hypertrophic toenails, 1 through 5 bilateral and clearing of subungual debris. No ulceration, no infection noted. Treatment options and alternatives discussed.  Recommended an incision and drainage and patient agreed.  Right hallux  was prepped with alcohol and a 3cc. of  2% lidocaine plain was administered in a  digital block fashion.  The toe was then prepped with betadine solution .  The offending nail border was then excised and all necrotic tissue was resected.  The area was then cleansed  and antibiotic ointment and a dry sterile dressing was applied.  The patient was dispensed instructions for aftercare. RTC 1 week for I & D. Return Visit-Office Procedure: Patient instructed to return to the office for a follow up visit 3 months for continued evaluation and treatment.    Helane GuntherGregory Karma Hiney DPM

## 2015-10-30 ENCOUNTER — Telehealth: Payer: Self-pay | Admitting: *Deleted

## 2015-10-30 DIAGNOSIS — E1165 Type 2 diabetes mellitus with hyperglycemia: Secondary | ICD-10-CM | POA: Diagnosis not present

## 2015-10-30 DIAGNOSIS — I5033 Acute on chronic diastolic (congestive) heart failure: Secondary | ICD-10-CM | POA: Diagnosis not present

## 2015-10-30 DIAGNOSIS — M1A9XX0 Chronic gout, unspecified, without tophus (tophi): Secondary | ICD-10-CM | POA: Diagnosis not present

## 2015-10-30 DIAGNOSIS — I480 Paroxysmal atrial fibrillation: Secondary | ICD-10-CM | POA: Diagnosis not present

## 2015-10-30 DIAGNOSIS — N401 Enlarged prostate with lower urinary tract symptoms: Secondary | ICD-10-CM | POA: Diagnosis not present

## 2015-10-30 DIAGNOSIS — I10 Essential (primary) hypertension: Secondary | ICD-10-CM | POA: Diagnosis not present

## 2015-10-30 NOTE — Telephone Encounter (Signed)
Lyla SonCarrie asked if I could give her pt's aftercare for the procedure with pt's right great toe from 10/27/2015.  I read Dr. Aram BeechamMayer's aftercare.soaking instructions to Shelburnarrie.

## 2015-11-03 ENCOUNTER — Ambulatory Visit (INDEPENDENT_AMBULATORY_CARE_PROVIDER_SITE_OTHER): Payer: Medicare Other | Admitting: Podiatry

## 2015-11-03 ENCOUNTER — Encounter: Payer: Self-pay | Admitting: Podiatry

## 2015-11-03 VITALS — BP 174/86 | HR 74 | Resp 16

## 2015-11-03 DIAGNOSIS — E1165 Type 2 diabetes mellitus with hyperglycemia: Secondary | ICD-10-CM | POA: Diagnosis not present

## 2015-11-03 DIAGNOSIS — L600 Ingrowing nail: Secondary | ICD-10-CM

## 2015-11-03 DIAGNOSIS — I10 Essential (primary) hypertension: Secondary | ICD-10-CM | POA: Diagnosis not present

## 2015-11-03 DIAGNOSIS — I5033 Acute on chronic diastolic (congestive) heart failure: Secondary | ICD-10-CM | POA: Diagnosis not present

## 2015-11-03 DIAGNOSIS — L03031 Cellulitis of right toe: Secondary | ICD-10-CM

## 2015-11-03 DIAGNOSIS — M1A9XX0 Chronic gout, unspecified, without tophus (tophi): Secondary | ICD-10-CM | POA: Diagnosis not present

## 2015-11-03 DIAGNOSIS — N401 Enlarged prostate with lower urinary tract symptoms: Secondary | ICD-10-CM | POA: Diagnosis not present

## 2015-11-03 DIAGNOSIS — I480 Paroxysmal atrial fibrillation: Secondary | ICD-10-CM | POA: Diagnosis not present

## 2015-11-03 NOTE — Progress Notes (Signed)
Subjective:     Patient ID: Richardson LandryBobby O Schoolfield, male   DOB: 08/09/1951, 64 y.o.   MRN: [id number]  HPIThis patient presents to the office following incision and drainage lateral right hallux.  He has been taking his antibiotics and soaking his foot. As directed.  He says the toe looks better and there is no pain noted.  He presents for evaluation and treatment.   Review of Systems     Objective:   Physical Exam Podiatric Exam: Vascular: dorsalis pedis and posterior tibial pulses are not palpable bilateral due to his swelling. Capillary return is immediate. Temperature gradient is WNL. Skin turgor WNL  Sensorium: Normal Semmes Weinstein monofilament test. Normal tactile sensation bilaterally. Nail Exam: Pt has thick disfigured discolored nails with subungual debris noted bilateral entire nail hallux through fifth toenails. Decreased redness and swelling noted lateral border.  No granulation tissue noted with desquamation noted. Ulcer Exam: There is no evidence of ulcer or pre-ulcerative changes or infection. Orthopedic Exam: Muscle tone and strength are WNL. No limitations in general ROM. No crepitus or effusions noted. Foot type and digits show no abnormalities. Bony prominences are unremarkable. Skin: No Porokeratosis. No infection or ulcers     Assessment:     S/p nail surgery     Plan:  ROV  Home instructions given.  Infection is healing and resolving.  RTC 11 weeks.  Helane GuntherGregory Egor Fullilove DPM

## 2015-11-06 DIAGNOSIS — M1A9XX0 Chronic gout, unspecified, without tophus (tophi): Secondary | ICD-10-CM | POA: Diagnosis not present

## 2015-11-06 DIAGNOSIS — I480 Paroxysmal atrial fibrillation: Secondary | ICD-10-CM | POA: Diagnosis not present

## 2015-11-06 DIAGNOSIS — N401 Enlarged prostate with lower urinary tract symptoms: Secondary | ICD-10-CM | POA: Diagnosis not present

## 2015-11-06 DIAGNOSIS — E1165 Type 2 diabetes mellitus with hyperglycemia: Secondary | ICD-10-CM | POA: Diagnosis not present

## 2015-11-06 DIAGNOSIS — I10 Essential (primary) hypertension: Secondary | ICD-10-CM | POA: Diagnosis not present

## 2015-11-06 DIAGNOSIS — I5033 Acute on chronic diastolic (congestive) heart failure: Secondary | ICD-10-CM | POA: Diagnosis not present

## 2015-11-09 DIAGNOSIS — I480 Paroxysmal atrial fibrillation: Secondary | ICD-10-CM | POA: Diagnosis not present

## 2015-11-09 DIAGNOSIS — I10 Essential (primary) hypertension: Secondary | ICD-10-CM | POA: Diagnosis not present

## 2015-11-09 DIAGNOSIS — N401 Enlarged prostate with lower urinary tract symptoms: Secondary | ICD-10-CM | POA: Diagnosis not present

## 2015-11-09 DIAGNOSIS — E1165 Type 2 diabetes mellitus with hyperglycemia: Secondary | ICD-10-CM | POA: Diagnosis not present

## 2015-11-09 DIAGNOSIS — M1A9XX0 Chronic gout, unspecified, without tophus (tophi): Secondary | ICD-10-CM | POA: Diagnosis not present

## 2015-11-09 DIAGNOSIS — I5033 Acute on chronic diastolic (congestive) heart failure: Secondary | ICD-10-CM | POA: Diagnosis not present

## 2015-11-13 ENCOUNTER — Telehealth: Payer: Self-pay | Admitting: Cardiovascular Disease

## 2015-11-13 ENCOUNTER — Telehealth: Payer: Self-pay | Admitting: Pediatrics

## 2015-11-13 NOTE — Telephone Encounter (Addendum)
Pt of Dr. Hazle CocaBerry's w/ hx PAF. Needs recommendation/OK for hold of Pradaxa for biopsy of prostate on Dec 27th.  Routed for advice.

## 2015-11-13 NOTE — Telephone Encounter (Signed)
Pt is going to have a prostate biopsy on 11-21-15. He needs to know if he can hold his Pradaxa for 5 days?

## 2015-11-13 NOTE — Telephone Encounter (Signed)
wrong provider hit by mistake

## 2015-11-14 NOTE — Telephone Encounter (Signed)
Spoke to LakesideJoyce ([facility], sister) about these instructions. She voiced understanding of recommendation to hold for 2 days prior and then to resume at urologist's instruction.  She will call urology office and if they need a faxed letter of clearance, have them contact us.

## 2015-11-14 NOTE — Telephone Encounter (Signed)
OK to hold Pradaxa for 2 days prior to Prostate Bx then restart afterwards when OK with Urologist

## 2015-11-15 DIAGNOSIS — H3500 Unspecified background retinopathy: Secondary | ICD-10-CM | POA: Diagnosis not present

## 2015-11-21 ENCOUNTER — Telehealth: Payer: Self-pay | Admitting: Cardiovascular Disease

## 2015-11-21 NOTE — Telephone Encounter (Signed)
Please call asap

## 2015-11-21 NOTE — Telephone Encounter (Signed)
Copied and pasted from previous note:  Telephone Encounter   Richardson LandryBobby O Colvin (MR# [id number])      Telephone Encounter Info    Author Note Status Last Update User Last Update Date/Time   Runell GessJonathan J Berry, MD Signed Runell GessJonathan J Berry, MD 11/14/2015 11:57 AM    Telephone Encounter    Expand All Collapse All   OK to hold Pradaxa for 2 days prior to Prostate Bx then restart afterwards when OK with Urologist      Routed to Dr. Hadley PenBrian Budzyn MD along with Message to Dr. Hadley PenBrian Budzyn

## 2015-11-21 NOTE — Telephone Encounter (Signed)
Called Urology Office and left message to call back

## 2015-11-21 NOTE — Telephone Encounter (Signed)
Vernona RiegerLaura called in stating that the pt will be having a prostate biopsy this afternoon and he needed to hold his Pradaxa 2 days prior to the procedure. Vernona RiegerLaura says that the pt's sister spoke with Harrold DonathNathan and he told her that he cleared to hold his medication, but the office is needing a form/ letter stating this. The physician that will be performing this procedure is Dr. Hadley PenBrian Budzyn. The fax number is [phone].   Thanks

## 2015-11-28 NOTE — Telephone Encounter (Signed)
Requesting surgical clearance:   1. Type of surgery: Prostate Biopsy   2. Surgeon: Dr. Hadley PenBrian Budzyn  3. Surgical date: 01/02/2016  4. Medications that need to be held: Pradaxa   5. CAD: No     6. [facility] will defer to: [facility]  Attn: Windell MouldingRuth, [place] [phone] phone [phone] fax

## 2015-11-29 NOTE — Telephone Encounter (Signed)
Okay to stop Pradaxa 2 days prior to prostate biopsy and restart when urologist feels that it safe

## 2015-11-29 NOTE — Telephone Encounter (Signed)
Pt clearance sent to [facility] Attn: Windell Mouldinguth.

## 2015-12-13 DIAGNOSIS — M109 Gout, unspecified: Secondary | ICD-10-CM | POA: Diagnosis not present

## 2015-12-13 DIAGNOSIS — I209 Angina pectoris, unspecified: Secondary | ICD-10-CM | POA: Diagnosis not present

## 2015-12-13 DIAGNOSIS — H3500 Unspecified background retinopathy: Secondary | ICD-10-CM | POA: Diagnosis not present

## 2015-12-13 DIAGNOSIS — Z1389 Encounter for screening for other disorder: Secondary | ICD-10-CM | POA: Diagnosis not present

## 2015-12-13 DIAGNOSIS — E1129 Type 2 diabetes mellitus with other diabetic kidney complication: Secondary | ICD-10-CM | POA: Diagnosis not present

## 2015-12-13 DIAGNOSIS — I480 Paroxysmal atrial fibrillation: Secondary | ICD-10-CM | POA: Diagnosis not present

## 2015-12-13 DIAGNOSIS — R808 Other proteinuria: Secondary | ICD-10-CM | POA: Diagnosis not present

## 2015-12-13 DIAGNOSIS — R972 Elevated prostate specific antigen [PSA]: Secondary | ICD-10-CM | POA: Diagnosis not present

## 2015-12-13 DIAGNOSIS — R609 Edema, unspecified: Secondary | ICD-10-CM | POA: Diagnosis not present

## 2015-12-13 DIAGNOSIS — I517 Cardiomegaly: Secondary | ICD-10-CM | POA: Diagnosis not present

## 2015-12-13 DIAGNOSIS — Z6841 Body Mass Index (BMI) 40.0 and over, adult: Secondary | ICD-10-CM | POA: Diagnosis not present

## 2015-12-15 ENCOUNTER — Ambulatory Visit: Payer: Medicare Other | Admitting: Cardiovascular Disease

## 2015-12-18 ENCOUNTER — Encounter: Payer: Self-pay | Admitting: *Deleted

## 2016-01-08 DIAGNOSIS — N401 Enlarged prostate with lower urinary tract symptoms: Secondary | ICD-10-CM | POA: Diagnosis not present

## 2016-01-08 DIAGNOSIS — Z0000 Encounter for general adult medical examination without abnormal findings: Secondary | ICD-10-CM | POA: Diagnosis not present

## 2016-01-08 DIAGNOSIS — R972 Elevated prostate specific antigen [PSA]: Secondary | ICD-10-CM | POA: Diagnosis not present

## 2016-01-08 DIAGNOSIS — Z8679 Personal history of other diseases of the circulatory system: Secondary | ICD-10-CM | POA: Diagnosis not present

## 2016-01-19 ENCOUNTER — Ambulatory Visit: Payer: Medicare Other | Admitting: Podiatry

## 2016-01-24 DIAGNOSIS — R809 Proteinuria, unspecified: Secondary | ICD-10-CM | POA: Diagnosis not present

## 2016-01-24 DIAGNOSIS — E1129 Type 2 diabetes mellitus with other diabetic kidney complication: Secondary | ICD-10-CM | POA: Diagnosis not present

## 2016-01-24 DIAGNOSIS — I10 Essential (primary) hypertension: Secondary | ICD-10-CM | POA: Diagnosis not present

## 2016-01-24 DIAGNOSIS — Z6841 Body Mass Index (BMI) 40.0 and over, adult: Secondary | ICD-10-CM | POA: Diagnosis not present

## 2016-01-26 ENCOUNTER — Ambulatory Visit (INDEPENDENT_AMBULATORY_CARE_PROVIDER_SITE_OTHER): Payer: Medicare Other | Admitting: Podiatry

## 2016-01-26 ENCOUNTER — Encounter: Payer: Self-pay | Admitting: Podiatry

## 2016-01-26 DIAGNOSIS — M79676 Pain in unspecified toe(s): Secondary | ICD-10-CM

## 2016-01-26 DIAGNOSIS — B351 Tinea unguium: Secondary | ICD-10-CM | POA: Diagnosis not present

## 2016-01-26 DIAGNOSIS — E1151 Type 2 diabetes mellitus with diabetic peripheral angiopathy without gangrene: Secondary | ICD-10-CM | POA: Diagnosis not present

## 2016-01-26 NOTE — Progress Notes (Signed)
Patient ID: Raymond LandryBobby O Gordon, male   DOB: 04/10/1951, 65 y.o.   MRN: [id number] Complaint:  Visit Type: Patient returns to my office for continued preventative foot care services. Complaint: Patient states" my nails have grown long and thick and become painful to walk and wear shoes" Patient has been diagnosed with DM with no complications. He presents for preventative foot care services. No changes to ROS  Podiatric Exam: Vascular: dorsalis pedis and posterior tibial pulses are absent  bilateral. Capillary return is diminished Temperature gradient is WNL. Skin turgor WNL  Sensorium: Normal Semmes Weinstein monofilament test. Normal tactile sensation bilaterally. Nail Exam: Pt has thick disfigured discolored nails with subungual debris noted bilateral entire nail hallux through fifth toenails Ulcer Exam: There is no evidence of ulcer or pre-ulcerative changes or infection. Orthopedic Exam: Muscle tone and strength are WNL. No limitations in general ROM. No crepitus or effusions noted. Foot type and digits show no abnormalities. Bony prominences are unremarkable. Skin: No Porokeratosis. No infection or ulcers  Diagnosis:  Tinea unguium, Pain in right toe, pain in left toes  Treatment & Plan Procedures and Treatment: Consent by patient was obtained for treatment procedures. The patient understood the discussion of treatment and procedures well. All questions were answered thoroughly reviewed. Debridement of mycotic and hypertrophic toenails, 1 through 5 bilateral and clearing of subungual debris. No ulceration, no infection noted.  Return Visit-Office Procedure: Patient instructed to return to the office for a follow up visit 3 months for continued evaluation and treatment.   Helane GuntherGregory Aaralynn Shepheard DPM

## 2016-01-30 DIAGNOSIS — N401 Enlarged prostate with lower urinary tract symptoms: Secondary | ICD-10-CM | POA: Diagnosis not present

## 2016-01-30 DIAGNOSIS — N138 Other obstructive and reflux uropathy: Secondary | ICD-10-CM | POA: Diagnosis not present

## 2016-01-30 DIAGNOSIS — Z0000 Encounter for general adult medical examination without abnormal findings: Secondary | ICD-10-CM | POA: Diagnosis not present

## 2016-01-30 DIAGNOSIS — R972 Elevated prostate specific antigen [PSA]: Secondary | ICD-10-CM | POA: Diagnosis not present

## 2016-01-31 ENCOUNTER — Encounter: Payer: Self-pay | Admitting: Cardiovascular Disease

## 2016-02-13 ENCOUNTER — Ambulatory Visit: Payer: Medicare Other | Admitting: Cardiovascular Disease

## 2016-02-26 DIAGNOSIS — E1129 Type 2 diabetes mellitus with other diabetic kidney complication: Secondary | ICD-10-CM | POA: Diagnosis not present

## 2016-02-26 DIAGNOSIS — R972 Elevated prostate specific antigen [PSA]: Secondary | ICD-10-CM | POA: Diagnosis not present

## 2016-02-26 DIAGNOSIS — Z125 Encounter for screening for malignant neoplasm of prostate: Secondary | ICD-10-CM | POA: Diagnosis not present

## 2016-02-26 DIAGNOSIS — M109 Gout, unspecified: Secondary | ICD-10-CM | POA: Diagnosis not present

## 2016-02-26 DIAGNOSIS — I10 Essential (primary) hypertension: Secondary | ICD-10-CM | POA: Diagnosis not present

## 2016-02-26 DIAGNOSIS — Z0000 Encounter for general adult medical examination without abnormal findings: Secondary | ICD-10-CM | POA: Insufficient documentation

## 2016-02-28 ENCOUNTER — Encounter: Payer: Self-pay | Admitting: Cardiovascular Disease

## 2016-02-28 ENCOUNTER — Ambulatory Visit (INDEPENDENT_AMBULATORY_CARE_PROVIDER_SITE_OTHER): Payer: Medicare Other | Admitting: Cardiovascular Disease

## 2016-02-28 VITALS — BP 138/62 | HR 59 | Ht 64.0 in

## 2016-02-28 DIAGNOSIS — I5032 Chronic diastolic (congestive) heart failure: Secondary | ICD-10-CM | POA: Diagnosis not present

## 2016-02-28 DIAGNOSIS — I480 Paroxysmal atrial fibrillation: Secondary | ICD-10-CM

## 2016-02-28 DIAGNOSIS — I10 Essential (primary) hypertension: Secondary | ICD-10-CM

## 2016-02-28 MED ORDER — VALSARTAN 160 MG PO TABS: 160.0000 mg | ORAL_TABLET | Freq: Every day | ORAL | Status: DC

## 2016-02-28 MED ORDER — CARVEDILOL 25 MG PO TABS: 25.0000 mg | ORAL_TABLET | Freq: Two times a day (BID) | ORAL | Status: DC

## 2016-02-28 NOTE — Assessment & Plan Note (Signed)
History of chronic diastolic heart failure with 2-D echocardiogram performed 07/09/15 revealing ejection fraction 65-70% with mild LVH and grade 2 diastolic dysfunction. He is on a diuretic (furosemide 80 mg by mouth twice a day). His weight is up 5 pounds since November. He is aware of a low-salt diet. Does have 2-3+ pitting edema. He was admitted in September 2016 with diastolic heart failure and was diuresed 21 L.

## 2016-02-28 NOTE — Progress Notes (Signed)
02/28/2016 Raymond Gordon   11/21/1951  [id number]  Primary Physician Gaspar Garbeichard W [name], MD Primary Cardiologist: Runell GessJonathan J. Ravleen Ries MD Roseanne RenoFACP,FACC,FAHA, FSCAI   HPI:  The patient is a very pleasant, 65 year old severely overweight, single African American male with no children who I last saw in the office 07/26/15. 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 His risk factors including noninsulin-requiring diabetes, hypertension, hyperlipidemia and remote tobacco abuse having quit smoking back in 1999 and had smoked 1 pack per day. His father did have an MI and had bypass surgery. He was admitted to [facility] July 17, 2011, with chest pain and A-fib. He was catheterized by Dr. Italyhad Hilty on July 18, 2011, revealing minimal luminal irregularities with small vessel disease and normal LV function. He ultimately spontaneously converted to sinus rhythm and was placed on Pradaxa because of a CHADS2 score of 2. He has had no recurrent symptoms. He denies chest pain or shortness of breath.Dr. Wylene Simmerisovec follows his lipid profile. He does give symptoms compatible with obstructive sleep apnea and currently wears CPAP with benefit. He was admitted to [facility] 07/08/15 with A. Fib with RVR and chest pain. His enzymes were negative. He is placed in IV due to diltiazem and converted spontaneously to sinus rhythm. His amlodipine was changed to by mouth diltiazem. He has had no recurrent symptoms. Since I saw him in the office back in August of last year he was admitted to [facility] on 08/13/15 through 08/22/15 with diastolic heart failure. He was diuresed 21 L and was educated about a low-salt diet.   Current Outpatient Prescriptions  Medication Sig Dispense Refill  . allopurinol (ZYLOPRIM) 300 MG tablet Take 300 mg by mouth daily.     Marland Kitchen. atorvastatin (LIPITOR) 40 MG tablet Take 40 mg by mouth daily.     . carvedilol (COREG) 25 MG tablet Take 1 tablet (25 mg  total) by mouth 2 (two) times daily with a meal. 180 tablet 3  . cephALEXin (KEFLEX) 500 MG capsule Take 1 capsule (500 mg total) by mouth 4 (four) times daily. 28 capsule 0  . dabigatran (PRADAXA) 150 MG CAPS Take 1 capsule (150 mg total) by mouth every 12 (twelve) hours. 60 capsule 6  . doxazosin (CARDURA) 4 MG tablet Take 4 mg by mouth daily.     . furosemide (LASIX) 80 MG tablet Take 1 tablet (80 mg total) by mouth 2 (two) times daily. 60 tablet 0  . insulin lispro protamine-lispro (HUMALOG 75/25 MIX) (75-25) 100 UNIT/ML SUSP injection Inject 30-60 Units into the skin 3 (three) times daily.    . isosorbide mononitrate (IMDUR) 30 MG 24 hr tablet Take 1 tablet (30 mg total) by mouth daily.    . Multiple Vitamin (MULTIVITAMIN WITH MINERALS) TABS tablet Take 1 tablet by mouth daily.    Marland Kitchen. omeprazole (PRILOSEC OTC) 20 MG tablet Take 1 tablet (20 mg total) by mouth daily.    Marland Kitchen. PRESCRIPTION MEDICATION Inhale into the lungs at bedtime. Pt is on C Pap machine    . tamsulosin (FLOMAX) 0.4 MG CAPS capsule Take 0.4 mg by mouth daily after supper.    . valsartan (DIOVAN) 160 MG tablet Take 1 tablet (160 mg total) by mouth daily. 90 tablet 3   No current facility-administered medications for this visit.    No Known Allergies  Social History   Social History  . Marital Status: Single  Spouse Name: N/A  . Number of Children: N/A  . Years of Education: N/A   Occupational History  . Not on file.   Social History Main Topics  . Smoking status: Former Smoker    Quit date: 11/25/1997  . Smokeless tobacco: Former Neurosurgeon    Quit date: 06/14/2015  . Alcohol Use: No  . Drug Use: No  . Sexual Activity: Not on file   Other Topics Concern  . Not on file   Social History Narrative     Review of Systems: General: negative for chills, fever, night sweats or weight changes.  Cardiovascular: negative for chest pain, dyspnea on exertion, edema, orthopnea, palpitations, paroxysmal nocturnal dyspnea or  shortness of breath Dermatological: negative for rash Respiratory: negative for cough or wheezing Urologic: negative for hematuria Abdominal: negative for nausea, vomiting, diarrhea, bright red blood per rectum, melena, or hematemesis Neurologic: negative for visual changes, syncope, or dizziness All other systems reviewed and are otherwise negative except as noted above.    Blood pressure 138/62, pulse 59, height  (1.626 m).  General appearance: alert and no distress Neck: no adenopathy, no carotid bruit, no JVD, supple, symmetrical, trachea midline and thyroid not enlarged, symmetric, no tenderness/mass/nodules Lungs: clear to auscultation bilaterally Heart: regular rate and rhythm, S1, S2 normal, no murmur, click, rub or gallop Extremities: 22-3+ pitting edema bilaterally  EKG sinus bradycardia 59 with Q waves in the inferior leads and evidence of LVH by voltage. I personally reviewed this EKG  ASSESSMENT AND PLAN:   Paroxysmal atrial fibrillation History of paroxysmal atrial fibrillation maintaining sinus rhythm on Pradaxa  oral anticoagulation.The CHA2DSVASC2 score is 2  .  Essential hypertension History of hypertension blood pressure measured at 138/62. He is on carvedilol and valsartan. Continue current meds at current dosing  Hyperlipidemia History of hyperlipidemia on statin therapy followed by his PCP  Chronic diastolic heart failure (HCC) History of chronic diastolic heart failure with 2-D echocardiogram performed 07/09/15 revealing ejection fraction 65-70% with mild LVH and grade 2 diastolic dysfunction. He is on a diuretic (furosemide 80 mg by mouth twice a day). His weight is up 5 pounds since November. He is aware of a low-salt diet. Does have 2-3+ pitting edema. He was admitted in September 2016 with diastolic heart failure and was diuresed 21 L.      Runell Gess MD FACP,FACC,FAHA, [facility] 02/28/2016 9:47 AM

## 2016-02-28 NOTE — Assessment & Plan Note (Signed)
History of hypertension blood pressure measured at 138/62. He is on carvedilol and valsartan. Continue current meds at current dosing

## 2016-02-28 NOTE — Assessment & Plan Note (Signed)
History of paroxysmal atrial fibrillation maintaining sinus rhythm on Pradaxa  oral anticoagulation.The CHA2DSVASC2 score is 2  .

## 2016-02-28 NOTE — Assessment & Plan Note (Signed)
History of hyperlipidemia on statin therapy followed by his PCP 

## 2016-02-28 NOTE — Patient Instructions (Signed)
Medication Instructions:  Your physician recommends that you continue on your current medications as directed. Please refer to the Current Medication list given to you today.   Labwork: Will get from your PCP.  Testing/Procedures: none  Follow-Up: We request that you follow-up in: 3 months with an extender and in 12 months with Dr San MorelleBerry  You will receive a reminder letter in the mail two months in advance. If you don't receive a letter, please call our office to schedule the follow-up appointment.    Any Other Special Instructions Will Be Listed Below (If Applicable).     If you need a refill on your cardiac medications before your next appointment, please call your pharmacy.

## 2016-03-04 DIAGNOSIS — Z6841 Body Mass Index (BMI) 40.0 and over, adult: Secondary | ICD-10-CM | POA: Diagnosis not present

## 2016-03-04 DIAGNOSIS — I209 Angina pectoris, unspecified: Secondary | ICD-10-CM | POA: Diagnosis not present

## 2016-03-04 DIAGNOSIS — H3500 Unspecified background retinopathy: Secondary | ICD-10-CM | POA: Diagnosis not present

## 2016-03-04 DIAGNOSIS — R808 Other proteinuria: Secondary | ICD-10-CM | POA: Diagnosis not present

## 2016-03-04 DIAGNOSIS — Z0000 Encounter for general adult medical examination without abnormal findings: Secondary | ICD-10-CM | POA: Diagnosis not present

## 2016-03-04 DIAGNOSIS — I517 Cardiomegaly: Secondary | ICD-10-CM | POA: Diagnosis not present

## 2016-03-04 DIAGNOSIS — E1129 Type 2 diabetes mellitus with other diabetic kidney complication: Secondary | ICD-10-CM | POA: Diagnosis not present

## 2016-03-04 DIAGNOSIS — R339 Retention of urine, unspecified: Secondary | ICD-10-CM | POA: Diagnosis not present

## 2016-03-04 DIAGNOSIS — N419 Inflammatory disease of prostate, unspecified: Secondary | ICD-10-CM | POA: Diagnosis not present

## 2016-03-04 DIAGNOSIS — G4733 Obstructive sleep apnea (adult) (pediatric): Secondary | ICD-10-CM | POA: Diagnosis not present

## 2016-03-04 DIAGNOSIS — R972 Elevated prostate specific antigen [PSA]: Secondary | ICD-10-CM | POA: Diagnosis not present

## 2016-03-05 ENCOUNTER — Other Ambulatory Visit: Payer: Self-pay | Admitting: Cardiovascular Disease

## 2016-03-18 ENCOUNTER — Other Ambulatory Visit: Payer: Self-pay | Admitting: Cardiovascular Disease

## 2016-03-18 NOTE — Telephone Encounter (Signed)
REFILL 

## 2016-04-24 DIAGNOSIS — Z6831 Body mass index (BMI) 31.0-31.9, adult: Secondary | ICD-10-CM | POA: Diagnosis not present

## 2016-04-24 DIAGNOSIS — I10 Essential (primary) hypertension: Secondary | ICD-10-CM | POA: Diagnosis not present

## 2016-04-24 DIAGNOSIS — E1129 Type 2 diabetes mellitus with other diabetic kidney complication: Secondary | ICD-10-CM | POA: Diagnosis not present

## 2016-04-24 DIAGNOSIS — E7800 Pure hypercholesterolemia, unspecified: Secondary | ICD-10-CM | POA: Diagnosis not present

## 2016-05-03 ENCOUNTER — Ambulatory Visit: Payer: Medicare Other | Admitting: Podiatry

## 2016-05-07 ENCOUNTER — Ambulatory Visit: Payer: Medicare Other | Admitting: Podiatry

## 2016-06-17 DIAGNOSIS — E7800 Pure hypercholesterolemia, unspecified: Secondary | ICD-10-CM | POA: Diagnosis not present

## 2016-06-17 DIAGNOSIS — G4733 Obstructive sleep apnea (adult) (pediatric): Secondary | ICD-10-CM | POA: Diagnosis not present

## 2016-06-17 DIAGNOSIS — I5030 Unspecified diastolic (congestive) heart failure: Secondary | ICD-10-CM | POA: Diagnosis not present

## 2016-06-17 DIAGNOSIS — E1151 Type 2 diabetes mellitus with diabetic peripheral angiopathy without gangrene: Secondary | ICD-10-CM | POA: Diagnosis not present

## 2016-06-17 DIAGNOSIS — Z6841 Body Mass Index (BMI) 40.0 and over, adult: Secondary | ICD-10-CM | POA: Diagnosis not present

## 2016-06-17 DIAGNOSIS — E1129 Type 2 diabetes mellitus with other diabetic kidney complication: Secondary | ICD-10-CM | POA: Diagnosis not present

## 2016-06-17 DIAGNOSIS — I119 Hypertensive heart disease without heart failure: Secondary | ICD-10-CM | POA: Diagnosis not present

## 2016-06-17 DIAGNOSIS — I209 Angina pectoris, unspecified: Secondary | ICD-10-CM | POA: Diagnosis not present

## 2016-06-17 DIAGNOSIS — I10 Essential (primary) hypertension: Secondary | ICD-10-CM | POA: Diagnosis not present

## 2016-06-17 DIAGNOSIS — Z7901 Long term (current) use of anticoagulants: Secondary | ICD-10-CM | POA: Diagnosis not present

## 2016-06-18 ENCOUNTER — Encounter: Payer: Self-pay | Admitting: Podiatry

## 2016-06-18 ENCOUNTER — Ambulatory Visit (INDEPENDENT_AMBULATORY_CARE_PROVIDER_SITE_OTHER): Payer: Medicare Other | Admitting: Podiatry

## 2016-06-18 DIAGNOSIS — M79676 Pain in unspecified toe(s): Secondary | ICD-10-CM

## 2016-06-18 DIAGNOSIS — E1151 Type 2 diabetes mellitus with diabetic peripheral angiopathy without gangrene: Secondary | ICD-10-CM | POA: Diagnosis not present

## 2016-06-18 DIAGNOSIS — B351 Tinea unguium: Secondary | ICD-10-CM

## 2016-06-18 NOTE — Progress Notes (Signed)
Patient ID: Raymond Gordon, male   DOB: 03-Sep-1951, 65 y.o.   MRN: [id number] Complaint:  Visit Type: Patient returns to my office for continued preventative foot care services. Complaint: Patient states" my nails have grown long and thick and become painful to walk and wear shoes" Patient has been diagnosed with DM with no complications. He presents for preventative foot care services. No changes to ROS  Podiatric Exam: Vascular: dorsalis pedis and posterior tibial pulses are absent  bilateral. Capillary return is diminished Temperature gradient is WNL. Skin turgor WNL  Sensorium: Normal Semmes Weinstein monofilament test. Normal tactile sensation bilaterally. Nail Exam: Pt has thick disfigured discolored nails with subungual debris noted bilateral entire nail hallux through fifth toenails Ulcer Exam: There is no evidence of ulcer or pre-ulcerative changes or infection. Orthopedic Exam: Muscle tone and strength are WNL. No limitations in general ROM. No crepitus or effusions noted. Foot type and digits show no abnormalities. Bony prominences are unremarkable. Skin: No Porokeratosis. No infection or ulcers  Diagnosis:  Tinea unguium, Pain in right toe, pain in left toes  Treatment & Plan Procedures and Treatment: Consent by patient was obtained for treatment procedures. The patient understood the discussion of treatment and procedures well. All questions were answered thoroughly reviewed. Debridement of mycotic and hypertrophic toenails, 1 through 5 bilateral and clearing of subungual debris. No ulceration, no infection noted.  Return Visit-Office Procedure: Patient instructed to return to the office for a follow up visit 3 months for continued evaluation and treatment.   Helane Gunther DPM

## 2016-08-09 DIAGNOSIS — R972 Elevated prostate specific antigen [PSA]: Secondary | ICD-10-CM | POA: Diagnosis not present

## 2016-08-09 DIAGNOSIS — N400 Enlarged prostate without lower urinary tract symptoms: Secondary | ICD-10-CM | POA: Diagnosis not present

## 2016-09-20 ENCOUNTER — Ambulatory Visit: Payer: Medicare Other | Admitting: Podiatry

## 2016-10-01 ENCOUNTER — Ambulatory Visit: Payer: Medicare Other | Admitting: Podiatry

## 2016-10-03 DIAGNOSIS — I119 Hypertensive heart disease without heart failure: Secondary | ICD-10-CM | POA: Diagnosis not present

## 2016-10-03 DIAGNOSIS — E1159 Type 2 diabetes mellitus with other circulatory complications: Secondary | ICD-10-CM | POA: Diagnosis not present

## 2016-10-03 DIAGNOSIS — G4733 Obstructive sleep apnea (adult) (pediatric): Secondary | ICD-10-CM | POA: Diagnosis not present

## 2016-10-03 DIAGNOSIS — Z23 Encounter for immunization: Secondary | ICD-10-CM | POA: Diagnosis not present

## 2016-10-03 DIAGNOSIS — I5030 Unspecified diastolic (congestive) heart failure: Secondary | ICD-10-CM | POA: Diagnosis not present

## 2016-10-03 DIAGNOSIS — I209 Angina pectoris, unspecified: Secondary | ICD-10-CM | POA: Diagnosis not present

## 2016-10-03 DIAGNOSIS — E1151 Type 2 diabetes mellitus with diabetic peripheral angiopathy without gangrene: Secondary | ICD-10-CM | POA: Diagnosis not present

## 2016-10-03 DIAGNOSIS — Z6841 Body Mass Index (BMI) 40.0 and over, adult: Secondary | ICD-10-CM | POA: Diagnosis not present

## 2016-10-03 DIAGNOSIS — Z7901 Long term (current) use of anticoagulants: Secondary | ICD-10-CM | POA: Diagnosis not present

## 2016-10-22 ENCOUNTER — Ambulatory Visit: Payer: Medicare Other | Admitting: Podiatry

## 2016-11-13 DIAGNOSIS — I10 Essential (primary) hypertension: Secondary | ICD-10-CM | POA: Diagnosis not present

## 2016-11-13 DIAGNOSIS — E1159 Type 2 diabetes mellitus with other circulatory complications: Secondary | ICD-10-CM | POA: Diagnosis not present

## 2016-12-05 DIAGNOSIS — R69 Illness, unspecified: Secondary | ICD-10-CM | POA: Diagnosis not present

## 2016-12-13 ENCOUNTER — Ambulatory Visit: Payer: Medicare Other | Admitting: Physician Assistant

## 2016-12-18 NOTE — Progress Notes (Signed)
Cardiology Office Note    Date:  12/19/2016   ID:  Raymond LandryBobby O Gordon, DOB 01/24/1951, MRN [id number]  PCP:  Gaspar Garbeichard W [name], MD  Cardiologist: Dr. Allyson SabalBerry   Chief Complaint  Patient presents with  . 3 mo f/u per Allyson SabalBerry    pt c/o swelling in lower extremitites; no other Sx.    History of Present Illness:    Raymond Gordon is a 66 y.o. male with past medical history of chronic diastolic CHF (EF 78-29%65-70% by echo in 06/2015), PAF (on Pradaxa), HTN, HLD and IDDM who presents to the office today for routine follow-up.   Was last seen by Dr. Allyson SabalBerry in 02/2016 and had 2+ pitting edema at the time of this encounter. He was continued on PO Lasix 80mg  BID.   In talking with the patient today, he reports doing well since his recent office visit. He ambulates over a half mile per day and denies any anginal symptoms. No recent chest pain, dyspnea with exertion, orthopnea, or PND. Does have lower extremity edema which has been present for many years.  He is concerned about the cost of his Pradaxa, as his insurance told him it was no longer a preferred medication. He denies any epistaxis, melena, or hematochezia while on the medication. Does have occasional bruising.  He checks his BP a few times per month and report readings are usually in the 140's - 150's. Unaware when BP is elevated. Reports good compliance with his medication regimen.     Past Medical History:  Diagnosis Date  . Bilateral lower extremity edema   . Chronic diastolic CHF (congestive heart failure) (HCC) 08/15/2015   a. 06/2015: echo showing EF of 65-70% with Grade 2 DD  . Diabetes (HCC)    INSULIN DEPENDENT  . Dysrhythmia   . Hyperlipidemia   . Hypertension   . Obesity   . Obesity   . Paroxysmal atrial fibrillation (HCC)   . Sleep apnea     Past Surgical History:  Procedure Laterality Date  . CARDIAC CATHETERIZATION    . CARDIAC CATHETERIZATION N/A 2012   no large vessel occlusions    Current Medications: Outpatient  Medications Prior to Visit  Medication Sig Dispense Refill  . allopurinol (ZYLOPRIM) 300 MG tablet Take 300 mg by mouth daily.     Marland Kitchen. atorvastatin (LIPITOR) 40 MG tablet Take 40 mg by mouth daily.     . carvedilol (COREG) 25 MG tablet TAKE ONE & ONE-HALF TABLETS BY MOUTH TWICE DAILY 90 tablet 3  . cephALEXin (KEFLEX) 500 MG capsule Take 1 capsule (500 mg total) by mouth 4 (four) times daily. 28 capsule 0  . doxazosin (CARDURA) 4 MG tablet Take 4 mg by mouth daily.     . furosemide (LASIX) 80 MG tablet Take 1 tablet (80 mg total) by mouth 2 (two) times daily. 60 tablet 0  . isosorbide mononitrate (IMDUR) 30 MG 24 hr tablet Take 1 tablet (30 mg total) by mouth daily.    . Multiple Vitamin (MULTIVITAMIN WITH MINERALS) TABS tablet Take 1 tablet by mouth daily.    Marland Kitchen. omeprazole (PRILOSEC OTC) 20 MG tablet Take 1 tablet (20 mg total) by mouth daily.    Marland Kitchen. PRESCRIPTION MEDICATION Inhale into the lungs at bedtime. Pt is on C Pap machine    . tamsulosin (FLOMAX) 0.4 MG CAPS capsule Take 0.4 mg by mouth daily after supper.    . valsartan (DIOVAN) 160 MG tablet Take 1 tablet (160 mg total) by  mouth daily. 90 tablet 3  . dabigatran (PRADAXA) 150 MG CAPS Take 1 capsule (150 mg total) by mouth every 12 (twelve) hours. 60 capsule 6  . insulin lispro protamine-lispro (HUMALOG 75/25 MIX) (75-25) 100 UNIT/ML SUSP injection Inject 30-60 Units into the skin 3 (three) times daily.     No facility-administered medications prior to visit.      Allergies:   Patient has no known allergies.   Social History   Social History  . Marital status: Single    Spouse name: N/A  . Number of children: N/A  . Years of education: N/A   Social History Main Topics  . Smoking status: Former Smoker    Quit date: 11/25/1997  . Smokeless tobacco: Former Neurosurgeon    Quit date: 06/14/2015  . Alcohol use No  . Drug use: No  . Sexual activity: Not Asked   Other Topics Concern  . None   Social History Narrative  . None      Family History:  The patient's family history includes Cancer in his father; Diabetes in his maternal grandfather and mother; GER disease in his father; Heart failure in his brother; Hyperlipidemia in his brother, mother, and sister; Hypertension in his brother, maternal grandfather, maternal grandmother, mother, and sister; Stroke in his brother, father, and maternal grandmother.   Review of Systems:   Please see the history of present illness.     General:  No chills, fever, night sweats or weight changes.  Cardiovascular:  No chest pain, dyspnea on exertion, orthopnea, palpitations, paroxysmal nocturnal dyspnea. Positive for lower extremity edema.  Dermatological: No rash, lesions/masses Respiratory: No cough, dyspnea Urologic: No hematuria, dysuria Abdominal:   No nausea, vomiting, diarrhea, bright red blood per rectum, melena, or hematemesis Neurologic:  No visual changes, wkns, changes in mental status. All other systems reviewed and are otherwise negative except as noted above.   Physical Exam:    VS:  BP (!) 160/64 (BP Location: Left Arm, Patient Position: Sitting, Cuff Size: Large)   Pulse 76   Ht 5\' 4"  (1.626 m)   Wt 263 lb 12.8 oz (119.7 kg)   BMI 45.28 kg/m    General: Well developed, overweight African American male appearing in no acute distress. Head: Normocephalic, atraumatic, sclera non-icteric, no xanthomas, nares are without discharge.  Neck: No carotid bruits. JVD difficult to assess secondary to body habitus Lungs: Respirations regular and unlabored, without wheezes or rales.  Heart: Regular rate and rhythm. No S3 or S4.  No murmur, no rubs, or gallops appreciated. Abdomen: Soft, non-tender, non-distended with normoactive bowel sounds. No hepatomegaly. No rebound/guarding. No obvious abdominal masses. Msk:  Strength and tone appear normal for age. No joint deformities or effusions. Extremities: No clubbing or cyanosis. Chronic non-pitting edema.  Distal pedal  pulses are 2+ bilaterally. Neuro: Alert and oriented X 3. Moves all extremities spontaneously. No focal deficits noted. Psych:  Responds to questions appropriately with a normal affect. Skin: No rashes or lesions noted  Wt Readings from Last 3 Encounters:  12/19/16 263 lb 12.8 oz (119.7 kg)  10/11/15 257 lb 8 oz (116.8 kg)  09/13/15 256 lb 9.6 oz (116.4 kg)     Studies/Labs Reviewed:   EKG:  EKG is ordered today.  The ekg ordered today demonstrates NSR, HR 76, with TWI in inferior leads (similar to prior tracings).   Recent Labs: No results found for requested labs within last 8760 hours.   Lipid Panel    Component Value Date/Time  CHOL 229 (H) 07/18/2011 0345   TRIG 101 07/18/2011 0345   HDL 46 07/18/2011 0345   CHOLHDL 5.0 07/18/2011 0345   VLDL 20 07/18/2011 0345   LDLCALC 163 (H) 07/18/2011 0345    Additional studies/ records that were reviewed today include:   Echocardiogram: 06/2015 Study Conclusions  - Left ventricle: The cavity size was normal. Wall thickness was   increased in a pattern of mild LVH. Systolic function was   vigorous. The estimated ejection fraction was in the range of 65%   to 70%. Wall motion was normal; there were no regional wall   motion abnormalities. Features are consistent with a pseudonormal   left ventricular filling pattern, with concomitant abnormal   relaxation and increased filling pressure (grade 2 diastolic   dysfunction). - Left atrium: The atrium was mildly to moderately dilated.  Assessment:    1. Chronic diastolic heart failure (HCC)   2. Paroxysmal atrial fibrillation (HCC)   3. Chronic anticoagulation   4. Essential hypertension   5. Hyperlipidemia, unspecified hyperlipidemia type      Plan:   In order of problems listed above:  1. Chronic Diastolic CHF - EF 65-70% by echo in 06/2015 with Grade 2 DD.  - lungs are clear on examination but he does have chronic lower extremity edema (present for years according  to the patient). Likely a component of lymphedema.  - he denies any recent chest pain, dyspnea with exertion, orthopnea, or PND.  - continue BB, Lasix, and Valsartan at current dosing.   2. Paroxysmal Atrial Fibrillation - denies any recent palpitations. - continue Coreg 37.5 mg twice a day.  3. Chronic Anticoagulation - This patients CHA2DS2-VASc Score and unadjusted Ischemic Stroke Rate (% per year) is equal to 3.2 % stroke rate/year from a score of 3 (HTN, DM, Age). Pradaxa is no longer preferred by the patient's insurance. He bring in with him the coverage book for 2018 and Eliquis is a Tier 3 drug. Will stop Pradaxa and start Eliquis 5mg  BID. - he denies any evidence of active bleeding.  4. Essential HTN - BP elevated at 160/64 initially, 158/84 on recheck.  - will increase Amlodipine to 10mg  daily. Continue Coreg 37.5mg  BID, Cardura 4mg  daily, Lasix 80mg  BID, Imdur 30mg  daily, and Valsartan 160mg  daily. If edema worsens with increased Amlodipine dosing, could resume 5mg  daily and increase Valsartan to 320mg  daily.   5. HLD - remains on Atorvastatin 40mg  daily. - followed by PCP.    Medication Adjustments/Labs and Tests Ordered: Current medicines are reviewed at length with the patient today.  Concerns regarding medicines are outlined above.  Medication changes, Labs and Tests ordered today are listed in the Patient Instructions below. Patient Instructions  Medication Instructions:  STOP PRADAXA START ELIQUIS 5MG  TWICE DAILY INCREASE AMLODIPINE TO 10MG  DAILY  If you need a refill on your cardiac medications before your next appointment, please call your pharmacy.  Follow-Up: Your physician wants you to follow-up in: 6 MONTHS WITH DR Allyson Sabal. You will receive a reminder letter in the mail two months in advance. If you don't receive a letter, please call our office (IN April) to schedule the follow-up appointment.    Signed, Ellsworth Lennox, PA  12/19/2016 5:50 PM    [facility] [address] [place], [address] [place], [name]  [address] Phone: [phone]; Fax: [phone]  [address], [address] [place], [name] [address] Phone: [phone]

## 2016-12-19 ENCOUNTER — Encounter: Payer: Self-pay | Admitting: Student

## 2016-12-19 ENCOUNTER — Ambulatory Visit (INDEPENDENT_AMBULATORY_CARE_PROVIDER_SITE_OTHER): Payer: Medicare HMO | Admitting: Student

## 2016-12-19 VITALS — BP 160/64 | HR 76 | Ht 64.0 in | Wt 263.8 lb

## 2016-12-19 DIAGNOSIS — I10 Essential (primary) hypertension: Secondary | ICD-10-CM | POA: Diagnosis not present

## 2016-12-19 DIAGNOSIS — I480 Paroxysmal atrial fibrillation: Secondary | ICD-10-CM

## 2016-12-19 DIAGNOSIS — Z7901 Long term (current) use of anticoagulants: Secondary | ICD-10-CM | POA: Insufficient documentation

## 2016-12-19 DIAGNOSIS — E785 Hyperlipidemia, unspecified: Secondary | ICD-10-CM

## 2016-12-19 DIAGNOSIS — I5032 Chronic diastolic (congestive) heart failure: Secondary | ICD-10-CM | POA: Diagnosis not present

## 2016-12-19 MED ORDER — APIXABAN 5 MG PO TABS: 5.0000 mg | ORAL_TABLET | Freq: Two times a day (BID) | ORAL | 8 refills | Status: DC

## 2016-12-19 MED ORDER — AMLODIPINE BESYLATE 10 MG PO TABS: 10.0000 mg | ORAL_TABLET | Freq: Every day | ORAL | 8 refills | Status: DC

## 2016-12-19 NOTE — Patient Instructions (Signed)
Medication Instructions:  STOP PRADAXA START ELIQUIS 5MG  TWICE DAILY INCREASE AMLODIPINE TO 10MG  DAILY  If you need a refill on your cardiac medications before your next appointment, please call your pharmacy.  Follow-Up: Your physician wants you to follow-up in: 6 MONTHS WITH DR Allyson SabalBERRY. You will receive a reminder letter in the mail two months in advance. If you don't receive a letter, please call our office (IN April) to schedule the follow-up appointment.   Thank you for choosing [facility]!!    Marcelino DusterMichelle, LPN [facility] ServicesBRITTANY STRADER PA-C

## 2017-01-01 DIAGNOSIS — E7800 Pure hypercholesterolemia, unspecified: Secondary | ICD-10-CM | POA: Diagnosis not present

## 2017-01-01 DIAGNOSIS — Z6841 Body Mass Index (BMI) 40.0 and over, adult: Secondary | ICD-10-CM | POA: Diagnosis not present

## 2017-01-01 DIAGNOSIS — Z7901 Long term (current) use of anticoagulants: Secondary | ICD-10-CM | POA: Diagnosis not present

## 2017-01-01 DIAGNOSIS — I480 Paroxysmal atrial fibrillation: Secondary | ICD-10-CM | POA: Diagnosis not present

## 2017-01-01 DIAGNOSIS — I209 Angina pectoris, unspecified: Secondary | ICD-10-CM | POA: Diagnosis not present

## 2017-01-01 DIAGNOSIS — R808 Other proteinuria: Secondary | ICD-10-CM | POA: Diagnosis not present

## 2017-01-01 DIAGNOSIS — M109 Gout, unspecified: Secondary | ICD-10-CM | POA: Diagnosis not present

## 2017-01-01 DIAGNOSIS — I10 Essential (primary) hypertension: Secondary | ICD-10-CM | POA: Diagnosis not present

## 2017-01-01 DIAGNOSIS — E1151 Type 2 diabetes mellitus with diabetic peripheral angiopathy without gangrene: Secondary | ICD-10-CM | POA: Diagnosis not present

## 2017-01-03 DIAGNOSIS — H52223 Regular astigmatism, bilateral: Secondary | ICD-10-CM | POA: Diagnosis not present

## 2017-02-11 DIAGNOSIS — R69 Illness, unspecified: Secondary | ICD-10-CM | POA: Diagnosis not present

## 2017-03-04 DIAGNOSIS — E1151 Type 2 diabetes mellitus with diabetic peripheral angiopathy without gangrene: Secondary | ICD-10-CM | POA: Diagnosis not present

## 2017-03-04 DIAGNOSIS — I10 Essential (primary) hypertension: Secondary | ICD-10-CM | POA: Diagnosis not present

## 2017-03-04 DIAGNOSIS — N390 Urinary tract infection, site not specified: Secondary | ICD-10-CM | POA: Diagnosis not present

## 2017-03-04 DIAGNOSIS — R8299 Other abnormal findings in urine: Secondary | ICD-10-CM | POA: Diagnosis not present

## 2017-03-04 DIAGNOSIS — Z125 Encounter for screening for malignant neoplasm of prostate: Secondary | ICD-10-CM | POA: Diagnosis not present

## 2017-03-04 DIAGNOSIS — R972 Elevated prostate specific antigen [PSA]: Secondary | ICD-10-CM | POA: Diagnosis not present

## 2017-03-04 DIAGNOSIS — E7800 Pure hypercholesterolemia, unspecified: Secondary | ICD-10-CM | POA: Diagnosis not present

## 2017-03-04 DIAGNOSIS — M109 Gout, unspecified: Secondary | ICD-10-CM | POA: Diagnosis not present

## 2017-03-05 DIAGNOSIS — Z6841 Body Mass Index (BMI) 40.0 and over, adult: Secondary | ICD-10-CM | POA: Diagnosis not present

## 2017-03-05 DIAGNOSIS — I10 Essential (primary) hypertension: Secondary | ICD-10-CM | POA: Diagnosis not present

## 2017-03-05 DIAGNOSIS — E1159 Type 2 diabetes mellitus with other circulatory complications: Secondary | ICD-10-CM | POA: Diagnosis not present

## 2017-03-05 DIAGNOSIS — I5030 Unspecified diastolic (congestive) heart failure: Secondary | ICD-10-CM | POA: Diagnosis not present

## 2017-03-11 DIAGNOSIS — E1159 Type 2 diabetes mellitus with other circulatory complications: Secondary | ICD-10-CM | POA: Diagnosis not present

## 2017-03-11 DIAGNOSIS — I119 Hypertensive heart disease without heart failure: Secondary | ICD-10-CM | POA: Diagnosis not present

## 2017-03-11 DIAGNOSIS — R808 Other proteinuria: Secondary | ICD-10-CM | POA: Diagnosis not present

## 2017-03-11 DIAGNOSIS — I208 Other forms of angina pectoris: Secondary | ICD-10-CM | POA: Diagnosis not present

## 2017-03-11 DIAGNOSIS — I517 Cardiomegaly: Secondary | ICD-10-CM | POA: Diagnosis not present

## 2017-03-11 DIAGNOSIS — H3500 Unspecified background retinopathy: Secondary | ICD-10-CM | POA: Diagnosis not present

## 2017-03-11 DIAGNOSIS — Z7901 Long term (current) use of anticoagulants: Secondary | ICD-10-CM | POA: Diagnosis not present

## 2017-03-11 DIAGNOSIS — I5030 Unspecified diastolic (congestive) heart failure: Secondary | ICD-10-CM | POA: Diagnosis not present

## 2017-03-11 DIAGNOSIS — Z0000 Encounter for general adult medical examination without abnormal findings: Secondary | ICD-10-CM | POA: Diagnosis not present

## 2017-03-11 DIAGNOSIS — G4733 Obstructive sleep apnea (adult) (pediatric): Secondary | ICD-10-CM | POA: Diagnosis not present

## 2017-03-17 DIAGNOSIS — R69 Illness, unspecified: Secondary | ICD-10-CM | POA: Diagnosis not present

## 2017-04-02 ENCOUNTER — Encounter: Payer: Self-pay | Admitting: Cardiovascular Disease

## 2017-04-02 ENCOUNTER — Ambulatory Visit (INDEPENDENT_AMBULATORY_CARE_PROVIDER_SITE_OTHER): Payer: Medicare HMO | Admitting: Cardiovascular Disease

## 2017-04-02 DIAGNOSIS — G4733 Obstructive sleep apnea (adult) (pediatric): Secondary | ICD-10-CM | POA: Diagnosis not present

## 2017-04-02 DIAGNOSIS — I5032 Chronic diastolic (congestive) heart failure: Secondary | ICD-10-CM | POA: Diagnosis not present

## 2017-04-02 DIAGNOSIS — I480 Paroxysmal atrial fibrillation: Secondary | ICD-10-CM

## 2017-04-02 DIAGNOSIS — E7800 Pure hypercholesterolemia, unspecified: Secondary | ICD-10-CM | POA: Diagnosis not present

## 2017-04-02 DIAGNOSIS — I10 Essential (primary) hypertension: Secondary | ICD-10-CM | POA: Diagnosis not present

## 2017-04-02 NOTE — Assessment & Plan Note (Signed)
History of hyperlipidemia on statin therapy followed by his PCP 

## 2017-04-02 NOTE — Patient Instructions (Signed)
I will request lab work from Dr. Deneen Hartsisovec's office.  Your physician wants you to follow-up in: 1 year with Dr. Allyson SabalBerry. You will receive a reminder letter in the mail two months in advance. If you don't receive a letter, please call our office to schedule the follow-up appointment.

## 2017-04-02 NOTE — Assessment & Plan Note (Signed)
History of obstructive sleep apnea on CPAP which she benefits from 

## 2017-04-02 NOTE — Progress Notes (Signed)
04/02/2017 Raymond Gordon   [birth year]  [id number]  Primary Physician [name], Adelfa Koh, MD Primary Cardiologist: Runell Gess MD Nicholes Calamity, [place]  HPI:  The patient is a very pleasant, 66 year old severely overweight, single African American male with no children who I last saw in the office 02/28/16. 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 His risk factors including noninsulin-requiring diabetes, hypertension, hyperlipidemia and remote tobacco abuse having quit smoking back in 1999 and had smoked 1 pack per day. His father did have an MI and had bypass surgery. He was admitted to [facility] July 17, 2011, with chest pain and A-fib. He was catheterized by Dr. Italy Hilty on July 18, 2011, revealing minimal luminal irregularities with small vessel disease and normal LV function. He ultimately spontaneously converted to sinus rhythm and was placed on Pradaxa because of a CHADS2 score of 2. He has had no recurrent symptoms. He denies chest pain or shortness of breath.Dr. Wylene Simmer follows his lipid profile. He does give symptoms compatible with obstructive sleep apnea and currently wears CPAP with benefit. He was admitted to [facility] 07/08/15 with A. Fib with RVR and chest pain. His enzymes were negative. He is placed in IV due to diltiazem and converted spontaneously to sinus rhythm. His amlodipine was changed to by mouth diltiazem. He has had no recurrent symptoms. Since I saw him in the office back in August of last year he was admitted to [facility] on 08/13/15 through 08/22/15 with diastolic heart failure. He was diuresed 21 L and was educated about a low-salt diet. Since I saw him a year ago he's remained critically stable denying chest pain or shortness of breath.   Current Outpatient Prescriptions  Medication Sig Dispense Refill  . allopurinol (ZYLOPRIM) 300 MG tablet Take 300 mg by mouth daily.     Marland Kitchen amLODipine (NORVASC)  10 MG tablet Take 1 tablet (10 mg total) by mouth daily. 30 tablet 8  . apixaban (ELIQUIS) 5 MG TABS tablet Take 1 tablet (5 mg total) by mouth 2 (two) times daily. 60 tablet 8  . atorvastatin (LIPITOR) 40 MG tablet Take 40 mg by mouth daily.     . carvedilol (COREG) 25 MG tablet TAKE ONE & ONE-HALF TABLETS BY MOUTH TWICE DAILY 90 tablet 3  . doxazosin (CARDURA) 4 MG tablet Take 4 mg by mouth daily.     . furosemide (LASIX) 80 MG tablet Take 1 tablet (80 mg total) by mouth 2 (two) times daily. 60 tablet 0  . insulin aspart (NOVOLOG) 100 UNIT/ML injection Inject 30-60 Units into the skin 3 (three) times daily before meals.    . isosorbide mononitrate (IMDUR) 30 MG 24 hr tablet Take 1 tablet (30 mg total) by mouth daily.    . Multiple Vitamin (MULTIVITAMIN WITH MINERALS) TABS tablet Take 1 tablet by mouth daily.    Marland Kitchen omeprazole (PRILOSEC OTC) 20 MG tablet Take 1 tablet (20 mg total) by mouth daily.    Marland Kitchen PRESCRIPTION MEDICATION Inhale into the lungs at bedtime. Pt is on C Pap machine    . tamsulosin (FLOMAX) 0.4 MG CAPS capsule Take 0.4 mg by mouth daily after supper.    . valsartan (DIOVAN) 160 MG tablet Take 1 tablet (160 mg total) by mouth daily. 90 tablet 3   No current facility-administered medications for this visit.     No Known Allergies  Social History  Social History  . Marital status: Single    Spouse name: N/A  . Number of children: N/A  . Years of education: N/A   Occupational History  . Not on file.   Social History Main Topics  . Smoking status: Former Smoker    Quit date: 11/25/1997  . Smokeless tobacco: Former NeurosurgeonUser    Quit date: 06/14/2015  . Alcohol use No  . Drug use: No  . Sexual activity: Not on file   Other Topics Concern  . Not on file   Social History Narrative  . No narrative on file     Review of Systems: General: negative for chills, fever, night sweats or weight changes.  Cardiovascular: negative for chest pain, dyspnea on exertion, edema,  orthopnea, palpitations, paroxysmal nocturnal dyspnea or shortness of breath Dermatological: negative for rash Respiratory: negative for cough or wheezing Urologic: negative for hematuria Abdominal: negative for nausea, vomiting, diarrhea, bright red blood per rectum, melena, or hematemesis Neurologic: negative for visual changes, syncope, or dizziness All other systems reviewed and are otherwise negative except as noted above.    Blood pressure (!) 182/58, pulse 75, height 5\' 5"  (1.651 m), weight 260 lb 9.6 oz (118.2 kg).  General appearance: alert and no distress Neck: no adenopathy, no carotid bruit, no JVD, supple, symmetrical, trachea midline and thyroid not enlarged, symmetric, no tenderness/mass/nodules Lungs: clear to auscultation bilaterally Heart: regular rate and rhythm, S1, S2 normal, no murmur, click, rub or gallop Extremities: extremities normal, atraumatic, no cyanosis or edema, edema 2+ edema, no edema, redness or tenderness in the calves or thighs, no ulcers, gangrene or trophic changes and 2+ edema  EKG normal sinus rhythm at 75 with occasional PACs. I personally reviewed this EKG  ASSESSMENT AND PLAN:   Paroxysmal atrial fibrillation History of paroxysmal atrial fibrillation maintaining sinus rhythm on beta blocker and Eliquis   Essential hypertension History of hypertension with blood pressure measured 182/58. Sustiva then he says usually runs at home when he checks his blood pressure. He is on amlodipine, carvedilol and Diovan. Continue current meds at current dosing  HLD (hyperlipidemia) History of hyperlipidemia on statin therapy followed by his PCP  CHF (congestive heart failure), NYHA class I (HCC) History of diastolic heart failure on oral diuretics were all restriction. He was hospitalized in September 2016 for diastolic heart failure and was diuresed but has not had a hospitalization for that since. He denies shortness of breath.  Severe OSA (obstructive  sleep apnea) History of obstructive sleep apnea on CPAP which she benefits from      Runell GessJonathan J. Danashia Landers MD [facility] CenterFACP,FACC,FAHA, [facility]SCAI 04/02/2017 9:19 AM

## 2017-04-02 NOTE — Assessment & Plan Note (Signed)
History of hypertension with blood pressure measured 182/58. Sustiva then he says usually runs at home when he checks his blood pressure. He is on amlodipine, carvedilol and Diovan. Continue current meds at current dosing

## 2017-04-02 NOTE — Assessment & Plan Note (Signed)
History of diastolic heart failure on oral diuretics were all restriction. He was hospitalized in September 2016 for diastolic heart failure and was diuresed but has not had a hospitalization for that since. He denies shortness of breath.

## 2017-04-02 NOTE — Assessment & Plan Note (Signed)
History of paroxysmal atrial fibrillation maintaining sinus rhythm on beta blocker and Eliquis

## 2017-05-09 DIAGNOSIS — I110 Hypertensive heart disease with heart failure: Secondary | ICD-10-CM | POA: Diagnosis not present

## 2017-05-09 DIAGNOSIS — Z79899 Other long term (current) drug therapy: Secondary | ICD-10-CM | POA: Diagnosis not present

## 2017-05-09 DIAGNOSIS — N401 Enlarged prostate with lower urinary tract symptoms: Secondary | ICD-10-CM | POA: Diagnosis not present

## 2017-05-09 DIAGNOSIS — E785 Hyperlipidemia, unspecified: Secondary | ICD-10-CM | POA: Diagnosis not present

## 2017-05-09 DIAGNOSIS — Z794 Long term (current) use of insulin: Secondary | ICD-10-CM | POA: Diagnosis not present

## 2017-05-09 DIAGNOSIS — E119 Type 2 diabetes mellitus without complications: Secondary | ICD-10-CM | POA: Diagnosis not present

## 2017-05-09 DIAGNOSIS — Z7982 Long term (current) use of aspirin: Secondary | ICD-10-CM | POA: Diagnosis not present

## 2017-05-09 DIAGNOSIS — Z008 Encounter for other general examination: Secondary | ICD-10-CM | POA: Diagnosis not present

## 2017-05-09 DIAGNOSIS — K210 Gastro-esophageal reflux disease with esophagitis: Secondary | ICD-10-CM | POA: Diagnosis not present

## 2017-05-09 DIAGNOSIS — Z0000 Encounter for general adult medical examination without abnormal findings: Secondary | ICD-10-CM | POA: Diagnosis not present

## 2017-05-09 DIAGNOSIS — Z6841 Body Mass Index (BMI) 40.0 and over, adult: Secondary | ICD-10-CM | POA: Diagnosis not present

## 2017-05-09 DIAGNOSIS — Z87891 Personal history of nicotine dependence: Secondary | ICD-10-CM | POA: Diagnosis not present

## 2017-05-09 DIAGNOSIS — R351 Nocturia: Secondary | ICD-10-CM | POA: Diagnosis not present

## 2017-05-09 DIAGNOSIS — I4891 Unspecified atrial fibrillation: Secondary | ICD-10-CM | POA: Diagnosis not present

## 2017-05-09 DIAGNOSIS — I509 Heart failure, unspecified: Secondary | ICD-10-CM | POA: Diagnosis not present

## 2017-05-09 DIAGNOSIS — G4730 Sleep apnea, unspecified: Secondary | ICD-10-CM | POA: Diagnosis not present

## 2017-05-09 DIAGNOSIS — Z7901 Long term (current) use of anticoagulants: Secondary | ICD-10-CM | POA: Diagnosis not present

## 2017-05-09 DIAGNOSIS — K029 Dental caries, unspecified: Secondary | ICD-10-CM | POA: Diagnosis not present

## 2017-06-17 DIAGNOSIS — I10 Essential (primary) hypertension: Secondary | ICD-10-CM | POA: Diagnosis not present

## 2017-06-17 DIAGNOSIS — E1159 Type 2 diabetes mellitus with other circulatory complications: Secondary | ICD-10-CM | POA: Diagnosis not present

## 2017-06-24 DIAGNOSIS — I5030 Unspecified diastolic (congestive) heart failure: Secondary | ICD-10-CM | POA: Diagnosis not present

## 2017-06-24 DIAGNOSIS — I480 Paroxysmal atrial fibrillation: Secondary | ICD-10-CM | POA: Diagnosis not present

## 2017-06-24 DIAGNOSIS — Z6841 Body Mass Index (BMI) 40.0 and over, adult: Secondary | ICD-10-CM | POA: Diagnosis not present

## 2017-06-24 DIAGNOSIS — I208 Other forms of angina pectoris: Secondary | ICD-10-CM | POA: Diagnosis not present

## 2017-06-24 DIAGNOSIS — E1159 Type 2 diabetes mellitus with other circulatory complications: Secondary | ICD-10-CM | POA: Diagnosis not present

## 2017-06-24 DIAGNOSIS — E7800 Pure hypercholesterolemia, unspecified: Secondary | ICD-10-CM | POA: Diagnosis not present

## 2017-06-24 DIAGNOSIS — G4733 Obstructive sleep apnea (adult) (pediatric): Secondary | ICD-10-CM | POA: Diagnosis not present

## 2017-06-24 DIAGNOSIS — Z7901 Long term (current) use of anticoagulants: Secondary | ICD-10-CM | POA: Diagnosis not present

## 2017-06-24 DIAGNOSIS — I119 Hypertensive heart disease without heart failure: Secondary | ICD-10-CM | POA: Diagnosis not present

## 2017-06-30 DIAGNOSIS — E1159 Type 2 diabetes mellitus with other circulatory complications: Secondary | ICD-10-CM | POA: Diagnosis not present

## 2017-07-01 DIAGNOSIS — R69 Illness, unspecified: Secondary | ICD-10-CM | POA: Diagnosis not present

## 2017-08-16 DIAGNOSIS — R69 Illness, unspecified: Secondary | ICD-10-CM | POA: Diagnosis not present

## 2017-09-23 DIAGNOSIS — Z6841 Body Mass Index (BMI) 40.0 and over, adult: Secondary | ICD-10-CM | POA: Diagnosis not present

## 2017-09-23 DIAGNOSIS — Z794 Long term (current) use of insulin: Secondary | ICD-10-CM | POA: Diagnosis not present

## 2017-09-23 DIAGNOSIS — I10 Essential (primary) hypertension: Secondary | ICD-10-CM | POA: Diagnosis not present

## 2017-09-23 DIAGNOSIS — E1159 Type 2 diabetes mellitus with other circulatory complications: Secondary | ICD-10-CM | POA: Diagnosis not present

## 2017-10-01 DIAGNOSIS — G4733 Obstructive sleep apnea (adult) (pediatric): Secondary | ICD-10-CM | POA: Diagnosis not present

## 2017-10-01 DIAGNOSIS — E7800 Pure hypercholesterolemia, unspecified: Secondary | ICD-10-CM | POA: Diagnosis not present

## 2017-10-01 DIAGNOSIS — I5030 Unspecified diastolic (congestive) heart failure: Secondary | ICD-10-CM | POA: Diagnosis not present

## 2017-10-01 DIAGNOSIS — E1159 Type 2 diabetes mellitus with other circulatory complications: Secondary | ICD-10-CM | POA: Diagnosis not present

## 2017-10-01 DIAGNOSIS — I119 Hypertensive heart disease without heart failure: Secondary | ICD-10-CM | POA: Diagnosis not present

## 2017-10-01 DIAGNOSIS — I208 Other forms of angina pectoris: Secondary | ICD-10-CM | POA: Diagnosis not present

## 2017-10-01 DIAGNOSIS — Z794 Long term (current) use of insulin: Secondary | ICD-10-CM | POA: Diagnosis not present

## 2017-10-01 DIAGNOSIS — I480 Paroxysmal atrial fibrillation: Secondary | ICD-10-CM | POA: Diagnosis not present

## 2017-10-01 DIAGNOSIS — Z7901 Long term (current) use of anticoagulants: Secondary | ICD-10-CM | POA: Diagnosis not present

## 2017-10-24 ENCOUNTER — Encounter: Payer: Self-pay | Admitting: Podiatry

## 2017-10-24 ENCOUNTER — Ambulatory Visit: Payer: Medicare HMO | Admitting: Podiatry

## 2017-10-24 DIAGNOSIS — M79676 Pain in unspecified toe(s): Secondary | ICD-10-CM | POA: Diagnosis not present

## 2017-10-24 DIAGNOSIS — L03031 Cellulitis of right toe: Secondary | ICD-10-CM

## 2017-10-24 DIAGNOSIS — B351 Tinea unguium: Secondary | ICD-10-CM | POA: Diagnosis not present

## 2017-10-24 DIAGNOSIS — L03032 Cellulitis of left toe: Secondary | ICD-10-CM

## 2017-10-24 DIAGNOSIS — E1151 Type 2 diabetes mellitus with diabetic peripheral angiopathy without gangrene: Secondary | ICD-10-CM

## 2017-10-24 MED ORDER — CEPHALEXIN 500 MG PO CAPS: 500.0000 mg | ORAL_CAPSULE | Freq: Three times a day (TID) | ORAL | 0 refills | Status: DC

## 2017-10-24 NOTE — Progress Notes (Signed)
This patient presents the office for an evaluation of his big toenail on his right foot.  Marland Kitchen. He says that the toenail has lifted up and has become blackened and is bleeding. He also says that there is an odor coming from under the toenail on the right big toe.  This patient was seen in the office over 15 months ago for preventative foot care services.  He has not returned to this office since that 06/18/2016 visit.  Marland Kitchen. He states he is not having any pain or discomfort to his toenails.  His family noticed the problem with the toenail only one week ago.  They then proceeded to make an appointment for an evaluation and treatment of his toenail, right foot. This patient has been diagnosed with uncontrolled diabetes. This patient presently is on eliquiss.   Marland Kitchen. He presents the office today for an evaluation and treatment of his nails.   General Appearance  Alert, conversant and in no acute stress.  Vascular  Dorsalis pedis and posterior pulses are absent   bilaterally.  Capillary return is diminished   Bilaterally. Temperature is within normal limits  Bilaterally  Neurologic  Senn-Weinstein monofilament wire test within normal limits  bilaterally. Muscle power  Within normal limits bilaterally.  Nails Thick disfigured discolored nails with subungual debride bilaterally from hallux to fifth toes bilaterally. The hallux toenail on the left foot has mild fluctuance noted along the medial border of the left great toe.  The right hallux toenail is unattached from the nailbed and appears to resemble a pincer toenail.  This nail is growing into the medial and lateral border of the right great toenail.  There is blackened dried blood noted subungually.  Purplish discoloration noted at the distal aspect of the nail plate right hallux.  Redness, swelling and pus is noted subungually on the right hallux.    Orthopedic  No limitations of motion of motion feet bilaterally.  No crepitus or effusions noted.  No bony pathology or  digital deformities noted.  Skin  normotropic skin with no porokeratosis noted bilaterally.    Paronychia hallux  B/L  Onychomycosis  B/L  Diabetes with vascular disease.     Debridement and grinding of long thick painful nails 10.  Incision and drainage of the medial border of the left hallux. to  release subungual abscess  Neosporin/DSD.  Incision and drainage of the nail plate on the right hallux with resection of the dried necrotic tissue at the distal aspect of the nail.  The nail place was resected down to the proximal nail fold.  Neosporin and dry sterile dressing was applied.  Home soaking instructions were given.  Prescribed cephalexin 500 mg 1 3 times a day.  This patient is to return the office in one week for further evaluation and treatment.   Helane GuntherGregory Jaleiyah Alas DPM

## 2017-10-28 ENCOUNTER — Other Ambulatory Visit: Payer: Self-pay | Admitting: Student

## 2017-10-29 ENCOUNTER — Encounter: Payer: Self-pay | Admitting: Podiatry

## 2017-10-29 ENCOUNTER — Ambulatory Visit (INDEPENDENT_AMBULATORY_CARE_PROVIDER_SITE_OTHER): Payer: Medicare HMO | Admitting: Podiatry

## 2017-10-29 DIAGNOSIS — E1151 Type 2 diabetes mellitus with diabetic peripheral angiopathy without gangrene: Secondary | ICD-10-CM | POA: Diagnosis not present

## 2017-10-29 DIAGNOSIS — L600 Ingrowing nail: Secondary | ICD-10-CM

## 2017-10-29 DIAGNOSIS — L03031 Cellulitis of right toe: Secondary | ICD-10-CM | POA: Diagnosis not present

## 2017-10-29 MED ORDER — CEPHALEXIN 500 MG PO CAPS: 500.0000 mg | ORAL_CAPSULE | Freq: Three times a day (TID) | ORAL | 0 refills | Status: DC

## 2017-10-29 NOTE — Progress Notes (Signed)
This patient presents to the office for continued evaluation of his infected toenail on his right foot.  Patient was treated last week for an infection to the right great toenail and he was treated with debridement of the nail as well as antibiotics.  He also says that he is been soaking and bandaging his foot as prescribed.  He says he presents th soaks his foot for over a week.  He says that his toe is feeling better and looking better.  He states that there is less drainage and no pain noted to his right great toenail.  He presents the office today for continued evaluation and treatment of his great toe, right foot.  General Appearance  Alert, conversant and in no acute stress.  Vascular  Dorsalis pedis and posterior pulses are absent  bilaterally.  Capillary return is within normal limits  Bilaterally. Temperature is within normal limits  Bilaterally  Neurologic  Senn-Weinstein monofilament wire test within normal limits  bilaterally. Muscle power  Within normal limits bilaterally.  Nails Thick disfigured discolored nails with subungual debride bilaterally from hallux to fifth toes bilaterally.examination of the right hallux reveals decreased redness, swelling and infection around the right great toenail.  There is a persistent granulation tissue noted at the distal aspect of the medial border of the right great toe.    Orthopedic  No limitations of motion of motion feet bilaterally.  No crepitus or effusions noted.  No bony pathology or digital deformities noted.  Skin  normotropic skin with no porokeratosis noted bilaterally.  No signs of infections or ulcers noted.   Healing paronychia right hallux.  ROV  Examination of his right hallux reveals decreased infection noted an improvement in the toe itself.  There is a persistent granulation tissue present, so I told him to continue his antibiotics which I  will prescribed today.  Patient was told to return to the office in 10 days for continued  evaluation and treatment.  If this condition worsens or becomes very painful, the patient was told to contact this office or go to the Emergency Department at the hospital.  Helane GuntherGregory Dvonte Gatliff DPM

## 2017-10-31 ENCOUNTER — Other Ambulatory Visit: Payer: Self-pay | Admitting: Podiatry

## 2017-11-07 ENCOUNTER — Encounter: Payer: Self-pay | Admitting: Podiatry

## 2017-11-07 ENCOUNTER — Ambulatory Visit: Payer: Medicare HMO | Admitting: Podiatry

## 2017-11-07 DIAGNOSIS — L03031 Cellulitis of right toe: Secondary | ICD-10-CM

## 2017-11-07 DIAGNOSIS — L600 Ingrowing nail: Secondary | ICD-10-CM

## 2017-11-07 DIAGNOSIS — E1151 Type 2 diabetes mellitus with diabetic peripheral angiopathy without gangrene: Secondary | ICD-10-CM

## 2017-11-07 NOTE — Progress Notes (Signed)
This patient presents to the office for continued evaluation of his infected toenail on his right foot.  Patient was treated last week for an infection to the right great toenail and he was treated with debridement of the nail as well as antibiotics.  He also says that he is been soaking and bandaging his foot as prescribed.  He says he he has been treating himself with  soaks his foot for over a week.  He says that his toe is feeling better and looking better.  He states that there is less drainage and no pain noted to his right great toenail.  He presents the office today for continued evaluation and treatment of his great toe, right foot.  General Appearance  Alert, conversant and in no acute stress.  Vascular  Dorsalis pedis and posterior pulses are absent  bilaterally.  Capillary return is within normal limits  Bilaterally. Temperature is within normal limits  Bilaterally  Neurologic  Senn-Weinstein monofilament wire test within normal limits  bilaterally. Muscle power  Within normal limits bilaterally.  Nails Thick disfigured discolored nails with subungual debride bilaterally from hallux to fifth toes bilaterally .Examination of the right hallux reveals decreased redness, swelling and infection around the right great toenail.    The active granulation tissue  has resolved.  Orthopedic  No limitations of motion of motion feet bilaterally.  No crepitus or effusions noted.  No bony pathology or digital deformities noted.  Skin  normotropic skin with no porokeratosis noted bilaterally.    Healing paronychia right hallux.  ROV  Examination of his right hallux reveals decreased infection noted an improvement in the toe itself.    Patient was told to return for preventative foot care services in 10 weeks.  He was told to bandage and peroxide his right hallux. since that he toe is healing nicely.  Helane GuntherGregory Merridy Pascoe DPM

## 2017-12-02 ENCOUNTER — Telehealth: Payer: Self-pay | Admitting: *Deleted

## 2017-12-02 NOTE — Telephone Encounter (Signed)
CPAP supply order signed by Dr. Kelly and returned to [facility]. 

## 2017-12-05 ENCOUNTER — Ambulatory Visit: Payer: Medicare Other | Admitting: Podiatry

## 2018-01-16 ENCOUNTER — Ambulatory Visit: Payer: Medicare HMO | Admitting: Podiatry

## 2018-01-19 DIAGNOSIS — R972 Elevated prostate specific antigen [PSA]: Secondary | ICD-10-CM | POA: Diagnosis not present

## 2018-01-26 DIAGNOSIS — N400 Enlarged prostate without lower urinary tract symptoms: Secondary | ICD-10-CM | POA: Diagnosis not present

## 2018-01-26 DIAGNOSIS — R972 Elevated prostate specific antigen [PSA]: Secondary | ICD-10-CM | POA: Diagnosis not present

## 2018-01-28 ENCOUNTER — Ambulatory Visit: Payer: Medicare HMO | Admitting: Podiatry

## 2018-02-12 DIAGNOSIS — H52223 Regular astigmatism, bilateral: Secondary | ICD-10-CM | POA: Diagnosis not present

## 2018-02-13 ENCOUNTER — Ambulatory Visit: Payer: Medicare HMO | Admitting: Podiatry

## 2018-02-13 ENCOUNTER — Encounter: Payer: Self-pay | Admitting: Podiatry

## 2018-02-13 DIAGNOSIS — E1151 Type 2 diabetes mellitus with diabetic peripheral angiopathy without gangrene: Secondary | ICD-10-CM | POA: Diagnosis not present

## 2018-02-13 DIAGNOSIS — D689 Coagulation defect, unspecified: Secondary | ICD-10-CM | POA: Diagnosis not present

## 2018-02-13 DIAGNOSIS — B351 Tinea unguium: Secondary | ICD-10-CM

## 2018-02-13 DIAGNOSIS — M79676 Pain in unspecified toe(s): Secondary | ICD-10-CM | POA: Diagnosis not present

## 2018-02-13 NOTE — Progress Notes (Signed)
Patient ID: Raymond LandryBobby O Nigg, male   DOB: 02/16/1951, 67 y.o.   MRN: [id number] Complaint:  Visit Type: Patient returns to my office for continued preventative foot care services. Complaint: Patient states" my nails have grown long and thick and become painful to walk and wear shoes" Patient has been diagnosed with DM with no complications. He presents for preventative foot care services. No changes to ROS.  No evidence of infection right hallux.  Patient is taking eliquiss.  Podiatric Exam: Vascular: dorsalis pedis and posterior tibial pulses are absent  bilateral. Capillary return is diminished Temperature gradient is WNL. Skin turgor WNL  Severe swelling. Sensorium: Normal Semmes Weinstein monofilament test. Normal tactile sensation bilaterally. Nail Exam: Pt has thick disfigured discolored nails with subungual debris noted bilateral entire nail hallux through fifth toenails Ulcer Exam: There is no evidence of ulcer or pre-ulcerative changes or infection. Orthopedic Exam: Muscle tone and strength are WNL. No limitations in general ROM. No crepitus or effusions noted. Foot type and digits show no abnormalities. Bony prominences are unremarkable. Skin: No Porokeratosis. No infection or ulcers  Diagnosis:  Tinea unguium, Pain in right toe, pain in left toes  Treatment & Plan Procedures and Treatment: Consent by patient was obtained for treatment procedures. The patient understood the discussion of treatment and procedures well. All questions were answered thoroughly reviewed. Debridement of mycotic and hypertrophic toenails, 1 through 5 bilateral and clearing of subungual debris. No ulceration, no infection noted.  Return Visit-Office Procedure: Patient instructed to return to the office for a follow up visit 3 months for continued evaluation and treatment.   Helane GuntherGregory Smaran Gaus DPM

## 2018-02-19 DIAGNOSIS — Z794 Long term (current) use of insulin: Secondary | ICD-10-CM | POA: Diagnosis not present

## 2018-02-19 DIAGNOSIS — I5030 Unspecified diastolic (congestive) heart failure: Secondary | ICD-10-CM | POA: Diagnosis not present

## 2018-02-19 DIAGNOSIS — Z6841 Body Mass Index (BMI) 40.0 and over, adult: Secondary | ICD-10-CM | POA: Diagnosis not present

## 2018-02-19 DIAGNOSIS — E1159 Type 2 diabetes mellitus with other circulatory complications: Secondary | ICD-10-CM | POA: Diagnosis not present

## 2018-02-19 DIAGNOSIS — Z9119 Patient's noncompliance with other medical treatment and regimen: Secondary | ICD-10-CM | POA: Diagnosis not present

## 2018-02-19 DIAGNOSIS — I10 Essential (primary) hypertension: Secondary | ICD-10-CM | POA: Diagnosis not present

## 2018-02-23 DIAGNOSIS — I10 Essential (primary) hypertension: Secondary | ICD-10-CM | POA: Diagnosis not present

## 2018-02-23 DIAGNOSIS — I480 Paroxysmal atrial fibrillation: Secondary | ICD-10-CM | POA: Diagnosis not present

## 2018-02-23 DIAGNOSIS — G4733 Obstructive sleep apnea (adult) (pediatric): Secondary | ICD-10-CM | POA: Diagnosis not present

## 2018-02-23 DIAGNOSIS — I119 Hypertensive heart disease without heart failure: Secondary | ICD-10-CM | POA: Diagnosis not present

## 2018-02-23 DIAGNOSIS — Z9119 Patient's noncompliance with other medical treatment and regimen: Secondary | ICD-10-CM | POA: Diagnosis not present

## 2018-02-23 DIAGNOSIS — Z7901 Long term (current) use of anticoagulants: Secondary | ICD-10-CM | POA: Diagnosis not present

## 2018-02-23 DIAGNOSIS — I209 Angina pectoris, unspecified: Secondary | ICD-10-CM | POA: Diagnosis not present

## 2018-02-23 DIAGNOSIS — E1159 Type 2 diabetes mellitus with other circulatory complications: Secondary | ICD-10-CM | POA: Diagnosis not present

## 2018-02-23 DIAGNOSIS — Z794 Long term (current) use of insulin: Secondary | ICD-10-CM | POA: Diagnosis not present

## 2018-02-28 DIAGNOSIS — R69 Illness, unspecified: Secondary | ICD-10-CM | POA: Diagnosis not present

## 2018-03-26 DIAGNOSIS — I10 Essential (primary) hypertension: Secondary | ICD-10-CM | POA: Diagnosis not present

## 2018-03-26 DIAGNOSIS — E1159 Type 2 diabetes mellitus with other circulatory complications: Secondary | ICD-10-CM | POA: Diagnosis not present

## 2018-03-26 DIAGNOSIS — Z794 Long term (current) use of insulin: Secondary | ICD-10-CM | POA: Diagnosis not present

## 2018-03-26 DIAGNOSIS — Z6841 Body Mass Index (BMI) 40.0 and over, adult: Secondary | ICD-10-CM | POA: Diagnosis not present

## 2018-03-30 DIAGNOSIS — R69 Illness, unspecified: Secondary | ICD-10-CM | POA: Diagnosis not present

## 2018-04-07 ENCOUNTER — Encounter: Payer: Self-pay | Admitting: Cardiovascular Disease

## 2018-04-07 ENCOUNTER — Ambulatory Visit: Payer: Medicare HMO | Admitting: Cardiovascular Disease

## 2018-04-07 DIAGNOSIS — I10 Essential (primary) hypertension: Secondary | ICD-10-CM | POA: Diagnosis not present

## 2018-04-07 DIAGNOSIS — I480 Paroxysmal atrial fibrillation: Secondary | ICD-10-CM | POA: Diagnosis not present

## 2018-04-07 DIAGNOSIS — I5032 Chronic diastolic (congestive) heart failure: Secondary | ICD-10-CM

## 2018-04-07 DIAGNOSIS — R600 Localized edema: Secondary | ICD-10-CM | POA: Diagnosis not present

## 2018-04-07 DIAGNOSIS — E7800 Pure hypercholesterolemia, unspecified: Secondary | ICD-10-CM

## 2018-04-07 NOTE — Patient Instructions (Signed)

## 2018-04-07 NOTE — Assessment & Plan Note (Signed)
History of chronic diastolic heart failure on oral diuretics aware of the need for salt restriction.

## 2018-04-07 NOTE — Assessment & Plan Note (Signed)
History of essential hypertension her blood pressure measured at 161/72.  He is on amlodipine, carvedilol, Cardura, and valsartan.  Continue current meds at current dosing.

## 2018-04-07 NOTE — Assessment & Plan Note (Signed)
History of hyperlipidemia on statin therapy. 

## 2018-04-07 NOTE — Progress Notes (Signed)
04/07/2018 Raymond Gordon   [birth year]  [id number]  Primary Physician [name], Adelfa Koh, MD Primary Cardiologist: Raymond Gess MD FACP, [place], [place], [place]  HPI:  Raymond Gordon is a 67 y.o.  severely overweight, single African American male with no children who I last saw in the office  04/02/2017. 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 His risk factors including noninsulin-requiring diabetes, hypertension, hyperlipidemia and remote tobacco abuse having quit smoking back in 1999 and had smoked 1 pack per day. His father did have an MI and had bypass surgery. He was admitted to [facility] July 17, 2011, with chest pain and A-fib. He was catheterized by Dr. [place] Hilty on July 18, 2011, revealing minimal luminal irregularities with small vessel disease and normal LV function. He ultimately spontaneously converted to sinus rhythm and was placed on Pradaxa because of a CHADS2 score of 2. He has had no recurrent symptoms. He denies chest pain or shortness of breath.Dr. Wylene Gordon follows his lipid profile. He does give symptoms compatible with obstructive sleep apnea and currently wears CPAP with benefit. He was admitted to [facility] 07/08/15 with A. Fib with RVR and chest pain. His enzymes were negative. He is placed in IV due to diltiazem and converted spontaneously to sinus rhythm. His amlodipine was changed to by mouth diltiazem. He has had no recurrent symptoms. Since I saw him in the office back in August of last year he was admitted to [facility] on 08/13/15 through 08/22/15 with diastolic heart failure. He was diuresed 21 L and was educated about a low-salt diet. Since I saw him a year ago he's remained critically stable denying chest pain or shortness of breath.     Current Meds  Medication Sig  . allopurinol (ZYLOPRIM) 300 MG tablet Take 300 mg by mouth daily.   Marland Kitchen amLODipine (NORVASC) 10 MG tablet TAKE ONE TABLET BY MOUTH ONCE  DAILY  . atorvastatin (LIPITOR) 40 MG tablet Take 40 mg by mouth daily.   . carvedilol (COREG) 25 MG tablet TAKE ONE & ONE-HALF TABLETS BY MOUTH TWICE DAILY  . cephALEXin (KEFLEX) 500 MG capsule Take 1 capsule (500 mg total) by mouth 3 (three) times daily.  Marland Kitchen doxazosin (CARDURA) 4 MG tablet Take 4 mg by mouth daily.   Marland Kitchen ELIQUIS 5 MG TABS tablet TAKE ONE TABLET BY MOUTH TWICE DAILY  . furosemide (LASIX) 80 MG tablet Take 1 tablet (80 mg total) by mouth 2 (two) times daily.  . insulin aspart (NOVOLOG) 100 UNIT/ML injection Inject 30-60 Units into the skin 3 (three) times daily before meals.  . isosorbide mononitrate (IMDUR) 30 MG 24 hr tablet Take 1 tablet (30 mg total) by mouth daily.  . Multiple Vitamin (MULTIVITAMIN WITH MINERALS) TABS tablet Take 1 tablet by mouth daily.  Marland Kitchen omeprazole (PRILOSEC OTC) 20 MG tablet Take 1 tablet (20 mg total) by mouth daily.  Marland Kitchen PRESCRIPTION MEDICATION Inhale into the lungs at bedtime. Pt is on C Pap machine  . Sulfamethoxazole-Trimethoprim (SULFAMETHOXAZOLE-TMP DS PO) Take by mouth 2 (two) times daily.  . tamsulosin (FLOMAX) 0.4 MG CAPS capsule Take 0.4 mg by mouth daily after supper.  . valsartan (DIOVAN) 160 MG tablet Take 1 tablet (160 mg total) by mouth daily.     No Known Allergies  Social History   Socioeconomic History  . Marital status: Single    Spouse name: Not on file  .  Number of children: Not on file  . Years of education: Not on file  . Highest education level: Not on file  Occupational History  . Not on file  Social Needs  . Financial resource strain: Not on file  . Food insecurity:    Worry: Not on file    Inability: Not on file  . Transportation needs:    Medical: Not on file    Non-medical: Not on file  Tobacco Use  . Smoking status: Former Smoker    Last attempt to quit: 11/25/1997    Years since quitting: 20.3  . Smokeless tobacco: Former Neurosurgeon    Quit date: 06/14/2015  Substance and Sexual Activity  . Alcohol use: No     Alcohol/week: 0.0 oz  . Drug use: No  . Sexual activity: Not on file  Lifestyle  . Physical activity:    Days per week: Not on file    Minutes per session: Not on file  . Stress: Not on file  Relationships  . Social connections:    Talks on phone: Not on file    Gets together: Not on file    Attends religious service: Not on file    Active member of club or organization: Not on file    Attends meetings of clubs or organizations: Not on file    Relationship status: Not on file  . Intimate partner violence:    Fear of current or ex partner: Not on file    Emotionally abused: Not on file    Physically abused: Not on file    Forced sexual activity: Not on file  Other Topics Concern  . Not on file  Social History Narrative  . Not on file     Review of Systems: General: negative for chills, fever, night sweats or weight changes.  Cardiovascular: negative for chest pain, dyspnea on exertion, edema, orthopnea, palpitations, paroxysmal nocturnal dyspnea or shortness of breath Dermatological: negative for rash Respiratory: negative for cough or wheezing Urologic: negative for hematuria Abdominal: negative for nausea, vomiting, diarrhea, bright red blood per rectum, melena, or hematemesis Neurologic: negative for visual changes, syncope, or dizziness All other systems reviewed and are otherwise negative except as noted above.    Blood pressure (!) 161/72, pulse 81, height  (1.6 m), weight 274 lb 6.4 oz (124.5 kg).  General appearance: alert and no distress Neck: no adenopathy, no carotid bruit, no JVD, supple, symmetrical, trachea midline and thyroid not enlarged, symmetric, no tenderness/mass/nodules Lungs: clear to auscultation bilaterally Heart: regular rate and rhythm, S1, S2 normal, no murmur, click, rub or gallop Extremities: 2-3+ pitting edema bilaterally which is chronic Pulses: 2+ and symmetric Skin: Bilateral venous stasis changes. Neurologic: Alert and oriented X  3, normal strength and tone. Normal symmetric reflexes. Normal coordination and gait  EKG sinus rhythm 81 without ST or T wave changes.  I personally reviewed this EKG.  ASSESSMENT AND PLAN:   Paroxysmal atrial fibrillation History of paroxysmal A. fib maintaining sinus rhythm on carvedilol and Eliquis oral anticoagulation.  Essential hypertension History of essential hypertension her blood pressure measured at 161/72.  He is on amlodipine, carvedilol, Cardura, and valsartan.  Continue current meds at current dosing.  HLD (hyperlipidemia) History of hyperlipidemia on statin therapy  CHF (congestive heart failure), NYHA class I (HCC) History of chronic diastolic heart failure on oral diuretics aware of the need for salt restriction.  Bilateral lower extremity edema Chronic.  We talked about the importance of leg raising and  compression stockings.  He is on a diuretic is aware salt restriction.      Raymond Gess MD FACP,FACC,FAHA, [facility] 04/07/2018 9:57 AM

## 2018-04-07 NOTE — Assessment & Plan Note (Signed)
History of paroxysmal A. fib maintaining sinus rhythm on carvedilol and Eliquis oral anticoagulation.

## 2018-04-07 NOTE — Assessment & Plan Note (Signed)
Chronic.  We talked about the importance of leg raising and compression stockings.  He is on a diuretic is aware salt restriction.

## 2018-04-08 ENCOUNTER — Encounter: Payer: Self-pay | Admitting: Cardiovascular Disease

## 2018-04-08 NOTE — Addendum Note (Signed)
Addended by: Lorain Childes A on: 04/08/2018 04:51 PM   Modules accepted: Orders

## 2018-04-28 DIAGNOSIS — R69 Illness, unspecified: Secondary | ICD-10-CM | POA: Diagnosis not present

## 2018-05-05 ENCOUNTER — Other Ambulatory Visit: Payer: Self-pay | Admitting: Cardiovascular Disease

## 2018-05-08 ENCOUNTER — Ambulatory Visit: Payer: Medicare HMO | Admitting: Podiatry

## 2018-05-15 ENCOUNTER — Ambulatory Visit (INDEPENDENT_AMBULATORY_CARE_PROVIDER_SITE_OTHER): Payer: Medicare Other | Admitting: Podiatry

## 2018-05-15 ENCOUNTER — Encounter: Payer: Self-pay | Admitting: Podiatry

## 2018-05-15 DIAGNOSIS — M79676 Pain in unspecified toe(s): Secondary | ICD-10-CM

## 2018-05-15 DIAGNOSIS — D689 Coagulation defect, unspecified: Secondary | ICD-10-CM | POA: Diagnosis not present

## 2018-05-15 DIAGNOSIS — B351 Tinea unguium: Secondary | ICD-10-CM | POA: Diagnosis not present

## 2018-05-15 DIAGNOSIS — E1151 Type 2 diabetes mellitus with diabetic peripheral angiopathy without gangrene: Secondary | ICD-10-CM

## 2018-05-15 NOTE — Progress Notes (Signed)
Patient ID: [name], male   DOB: 12/03/1950, 67 y.o.   MRN: [id number] Complaint:  Visit Type: Patient returns to my office for continued preventative foot care services. Complaint: Patient states" my nails have grown long and thick and become painful to walk and wear shoes" Patient has been diagnosed with DM with no complications. He presents for preventative foot care services. No changes to ROS.  No evidence of infection right hallux.  Patient is taking eliquiss.  Podiatric Exam: Vascular: dorsalis pedis and posterior tibial pulses are absent  bilateral. Capillary return is diminished Temperature gradient is WNL. Skin turgor WNL  Severe swelling. Sensorium: Normal Semmes Weinstein monofilament test. Normal tactile sensation bilaterally. Nail Exam: Pt has thick disfigured discolored nails with subungual debris noted bilateral entire nail hallux through fifth toenails Ulcer Exam: There is no evidence of ulcer or pre-ulcerative changes or infection. Orthopedic Exam: Muscle tone and strength are WNL. No limitations in general ROM. No crepitus or effusions noted. Foot type and digits show no abnormalities. Bony prominences are unremarkable. Skin: No Porokeratosis. No infection or ulcers  Diagnosis:  Tinea unguium, Pain in right toe, pain in left toes  Treatment & Plan Procedures and Treatment: Consent by patient was obtained for treatment procedures. The patient understood the discussion of treatment and procedures well. All questions were answered thoroughly reviewed. Debridement of mycotic and hypertrophic toenails, 1 through 5 bilateral and clearing of subungual debris. No ulceration, no infection noted.  Return Visit-Office Procedure: Patient instructed to return to the office for a follow up visit 3 months for continued evaluation and treatment.   Raymond Gordon DPM

## 2018-05-20 ENCOUNTER — Other Ambulatory Visit: Payer: Self-pay | Admitting: Cardiovascular Disease

## 2018-05-20 DIAGNOSIS — Z6841 Body Mass Index (BMI) 40.0 and over, adult: Secondary | ICD-10-CM | POA: Diagnosis not present

## 2018-05-20 DIAGNOSIS — E1159 Type 2 diabetes mellitus with other circulatory complications: Secondary | ICD-10-CM | POA: Diagnosis not present

## 2018-05-20 DIAGNOSIS — I10 Essential (primary) hypertension: Secondary | ICD-10-CM | POA: Diagnosis not present

## 2018-05-20 DIAGNOSIS — Z794 Long term (current) use of insulin: Secondary | ICD-10-CM | POA: Diagnosis not present

## 2018-05-21 NOTE — Telephone Encounter (Signed)
Rx request sent to pharmacy.  

## 2018-05-27 DIAGNOSIS — I5030 Unspecified diastolic (congestive) heart failure: Secondary | ICD-10-CM | POA: Diagnosis not present

## 2018-05-27 DIAGNOSIS — Z9119 Patient's noncompliance with other medical treatment and regimen: Secondary | ICD-10-CM | POA: Diagnosis not present

## 2018-05-27 DIAGNOSIS — Z7901 Long term (current) use of anticoagulants: Secondary | ICD-10-CM | POA: Diagnosis not present

## 2018-05-27 DIAGNOSIS — R69 Illness, unspecified: Secondary | ICD-10-CM | POA: Diagnosis not present

## 2018-05-27 DIAGNOSIS — R600 Localized edema: Secondary | ICD-10-CM | POA: Diagnosis not present

## 2018-05-27 DIAGNOSIS — I208 Other forms of angina pectoris: Secondary | ICD-10-CM | POA: Diagnosis not present

## 2018-05-27 DIAGNOSIS — I119 Hypertensive heart disease without heart failure: Secondary | ICD-10-CM | POA: Diagnosis not present

## 2018-05-27 DIAGNOSIS — Z794 Long term (current) use of insulin: Secondary | ICD-10-CM | POA: Diagnosis not present

## 2018-05-27 DIAGNOSIS — G4733 Obstructive sleep apnea (adult) (pediatric): Secondary | ICD-10-CM | POA: Diagnosis not present

## 2018-05-27 DIAGNOSIS — E1159 Type 2 diabetes mellitus with other circulatory complications: Secondary | ICD-10-CM | POA: Diagnosis not present

## 2018-05-27 DIAGNOSIS — E876 Hypokalemia: Secondary | ICD-10-CM | POA: Diagnosis not present

## 2018-06-30 DIAGNOSIS — R69 Illness, unspecified: Secondary | ICD-10-CM | POA: Diagnosis not present

## 2018-08-04 DIAGNOSIS — R69 Illness, unspecified: Secondary | ICD-10-CM | POA: Diagnosis not present

## 2018-08-14 ENCOUNTER — Ambulatory Visit: Payer: Medicare HMO | Admitting: Podiatry

## 2018-08-20 DIAGNOSIS — I5030 Unspecified diastolic (congestive) heart failure: Secondary | ICD-10-CM | POA: Diagnosis not present

## 2018-08-20 DIAGNOSIS — Z794 Long term (current) use of insulin: Secondary | ICD-10-CM | POA: Diagnosis not present

## 2018-08-20 DIAGNOSIS — E1159 Type 2 diabetes mellitus with other circulatory complications: Secondary | ICD-10-CM | POA: Diagnosis not present

## 2018-08-20 DIAGNOSIS — Z23 Encounter for immunization: Secondary | ICD-10-CM | POA: Diagnosis not present

## 2018-08-20 DIAGNOSIS — I10 Essential (primary) hypertension: Secondary | ICD-10-CM | POA: Diagnosis not present

## 2018-08-27 DIAGNOSIS — Z6841 Body Mass Index (BMI) 40.0 and over, adult: Secondary | ICD-10-CM | POA: Diagnosis not present

## 2018-08-27 DIAGNOSIS — I119 Hypertensive heart disease without heart failure: Secondary | ICD-10-CM | POA: Diagnosis not present

## 2018-08-27 DIAGNOSIS — I208 Other forms of angina pectoris: Secondary | ICD-10-CM | POA: Diagnosis not present

## 2018-08-27 DIAGNOSIS — I10 Essential (primary) hypertension: Secondary | ICD-10-CM | POA: Diagnosis not present

## 2018-08-27 DIAGNOSIS — Z9119 Patient's noncompliance with other medical treatment and regimen: Secondary | ICD-10-CM | POA: Diagnosis not present

## 2018-08-27 DIAGNOSIS — Z794 Long term (current) use of insulin: Secondary | ICD-10-CM | POA: Diagnosis not present

## 2018-08-27 DIAGNOSIS — E1159 Type 2 diabetes mellitus with other circulatory complications: Secondary | ICD-10-CM | POA: Diagnosis not present

## 2018-08-27 DIAGNOSIS — E7800 Pure hypercholesterolemia, unspecified: Secondary | ICD-10-CM | POA: Diagnosis not present

## 2018-10-30 ENCOUNTER — Ambulatory Visit: Payer: Medicare HMO | Admitting: Podiatry

## 2018-12-03 DIAGNOSIS — E1159 Type 2 diabetes mellitus with other circulatory complications: Secondary | ICD-10-CM | POA: Diagnosis not present

## 2018-12-03 DIAGNOSIS — R69 Illness, unspecified: Secondary | ICD-10-CM | POA: Diagnosis not present

## 2018-12-03 DIAGNOSIS — I10 Essential (primary) hypertension: Secondary | ICD-10-CM | POA: Diagnosis not present

## 2018-12-03 DIAGNOSIS — E1151 Type 2 diabetes mellitus with diabetic peripheral angiopathy without gangrene: Secondary | ICD-10-CM | POA: Diagnosis not present

## 2018-12-03 DIAGNOSIS — Z794 Long term (current) use of insulin: Secondary | ICD-10-CM | POA: Diagnosis not present

## 2018-12-18 ENCOUNTER — Ambulatory Visit: Payer: Medicare HMO | Admitting: Podiatry

## 2018-12-18 ENCOUNTER — Encounter: Payer: Self-pay | Admitting: Podiatry

## 2018-12-18 DIAGNOSIS — B351 Tinea unguium: Secondary | ICD-10-CM

## 2018-12-18 DIAGNOSIS — D689 Coagulation defect, unspecified: Secondary | ICD-10-CM | POA: Diagnosis not present

## 2018-12-18 DIAGNOSIS — M79676 Pain in unspecified toe(s): Secondary | ICD-10-CM | POA: Diagnosis not present

## 2018-12-18 DIAGNOSIS — E1151 Type 2 diabetes mellitus with diabetic peripheral angiopathy without gangrene: Secondary | ICD-10-CM | POA: Diagnosis not present

## 2018-12-18 NOTE — Progress Notes (Signed)
Patient ID: Raymond Gordon, male   DOB: 1951-09-02, 68 y.o.   MRN: [id number] Complaint:  Visit Type: Patient returns to my office for continued preventative foot care services. Complaint: Patient states" my nails have grown long and thick and become painful to walk and wear shoes" Patient has been diagnosed with DM with no complications. He presents for preventative foot care services. No changes to ROS.  No evidence of infection right hallux.  Patient is taking eliquiss.  Podiatric Exam: Vascular: dorsalis pedis and posterior tibial pulses are absent  bilateral. Capillary return is diminished Temperature gradient is WNL. Skin turgor WNL  Severe swelling. Sensorium: Normal Semmes Weinstein monofilament test. Normal tactile sensation bilaterally. Nail Exam: Pt has thick disfigured discolored nails with subungual debris noted bilateral entire nail hallux through fifth toenails Ulcer Exam: There is no evidence of ulcer or pre-ulcerative changes or infection. Orthopedic Exam: Muscle tone and strength are WNL. No limitations in general ROM. No crepitus or effusions noted. Foot type and digits show no abnormalities. Bony prominences are unremarkable. Skin: No Porokeratosis. No infection or ulcers  Diagnosis:  Tinea unguium, Pain in right toe, pain in left toes  Treatment & Plan Procedures and Treatment: Consent by patient was obtained for treatment procedures. The patient understood the discussion of treatment and procedures well. All questions were answered thoroughly reviewed. Debridement of mycotic and hypertrophic toenails, 1 through 5 bilateral and clearing of subungual debris. No ulceration, no infection noted.  Return Visit-Office Procedure: Patient instructed to return to the office for a follow up visit 3 months for continued evaluation and treatment.   Helane Gunther DPM

## 2019-01-05 DIAGNOSIS — Z794 Long term (current) use of insulin: Secondary | ICD-10-CM | POA: Diagnosis not present

## 2019-01-05 DIAGNOSIS — M109 Gout, unspecified: Secondary | ICD-10-CM | POA: Diagnosis not present

## 2019-01-05 DIAGNOSIS — Z9119 Patient's noncompliance with other medical treatment and regimen: Secondary | ICD-10-CM | POA: Diagnosis not present

## 2019-01-05 DIAGNOSIS — I480 Paroxysmal atrial fibrillation: Secondary | ICD-10-CM | POA: Diagnosis not present

## 2019-01-05 DIAGNOSIS — E7800 Pure hypercholesterolemia, unspecified: Secondary | ICD-10-CM | POA: Diagnosis not present

## 2019-01-05 DIAGNOSIS — E1151 Type 2 diabetes mellitus with diabetic peripheral angiopathy without gangrene: Secondary | ICD-10-CM | POA: Diagnosis not present

## 2019-01-05 DIAGNOSIS — I208 Other forms of angina pectoris: Secondary | ICD-10-CM | POA: Diagnosis not present

## 2019-01-05 DIAGNOSIS — R808 Other proteinuria: Secondary | ICD-10-CM | POA: Diagnosis not present

## 2019-01-05 DIAGNOSIS — I119 Hypertensive heart disease without heart failure: Secondary | ICD-10-CM | POA: Diagnosis not present

## 2019-01-05 DIAGNOSIS — Z7901 Long term (current) use of anticoagulants: Secondary | ICD-10-CM | POA: Diagnosis not present

## 2019-03-03 DIAGNOSIS — Z794 Long term (current) use of insulin: Secondary | ICD-10-CM | POA: Diagnosis not present

## 2019-03-03 DIAGNOSIS — E1159 Type 2 diabetes mellitus with other circulatory complications: Secondary | ICD-10-CM | POA: Diagnosis not present

## 2019-03-03 DIAGNOSIS — I10 Essential (primary) hypertension: Secondary | ICD-10-CM | POA: Diagnosis not present

## 2019-03-19 ENCOUNTER — Ambulatory Visit: Payer: Medicare HMO | Admitting: Podiatry

## 2019-04-01 DIAGNOSIS — I1310 Hypertensive heart and chronic kidney disease without heart failure, with stage 1 through stage 4 chronic kidney disease, or unspecified chronic kidney disease: Secondary | ICD-10-CM | POA: Diagnosis not present

## 2019-04-01 DIAGNOSIS — E7800 Pure hypercholesterolemia, unspecified: Secondary | ICD-10-CM | POA: Diagnosis not present

## 2019-04-01 DIAGNOSIS — E1151 Type 2 diabetes mellitus with diabetic peripheral angiopathy without gangrene: Secondary | ICD-10-CM | POA: Diagnosis not present

## 2019-04-01 DIAGNOSIS — N419 Inflammatory disease of prostate, unspecified: Secondary | ICD-10-CM | POA: Diagnosis not present

## 2019-04-01 DIAGNOSIS — M109 Gout, unspecified: Secondary | ICD-10-CM | POA: Diagnosis not present

## 2019-04-01 DIAGNOSIS — Z125 Encounter for screening for malignant neoplasm of prostate: Secondary | ICD-10-CM | POA: Diagnosis not present

## 2019-04-05 DIAGNOSIS — I1310 Hypertensive heart and chronic kidney disease without heart failure, with stage 1 through stage 4 chronic kidney disease, or unspecified chronic kidney disease: Secondary | ICD-10-CM | POA: Diagnosis not present

## 2019-04-05 DIAGNOSIS — R82998 Other abnormal findings in urine: Secondary | ICD-10-CM | POA: Diagnosis not present

## 2019-04-07 DIAGNOSIS — Z0000 Encounter for general adult medical examination without abnormal findings: Secondary | ICD-10-CM | POA: Diagnosis not present

## 2019-04-07 DIAGNOSIS — M109 Gout, unspecified: Secondary | ICD-10-CM | POA: Diagnosis not present

## 2019-04-07 DIAGNOSIS — Z794 Long term (current) use of insulin: Secondary | ICD-10-CM | POA: Diagnosis not present

## 2019-04-07 DIAGNOSIS — E1159 Type 2 diabetes mellitus with other circulatory complications: Secondary | ICD-10-CM | POA: Diagnosis not present

## 2019-04-07 DIAGNOSIS — I5030 Unspecified diastolic (congestive) heart failure: Secondary | ICD-10-CM | POA: Diagnosis not present

## 2019-04-07 DIAGNOSIS — E7800 Pure hypercholesterolemia, unspecified: Secondary | ICD-10-CM | POA: Diagnosis not present

## 2019-04-07 DIAGNOSIS — Z9119 Patient's noncompliance with other medical treatment and regimen: Secondary | ICD-10-CM | POA: Diagnosis not present

## 2019-04-07 DIAGNOSIS — I1310 Hypertensive heart and chronic kidney disease without heart failure, with stage 1 through stage 4 chronic kidney disease, or unspecified chronic kidney disease: Secondary | ICD-10-CM | POA: Diagnosis not present

## 2019-04-07 DIAGNOSIS — I209 Angina pectoris, unspecified: Secondary | ICD-10-CM | POA: Diagnosis not present

## 2019-04-07 DIAGNOSIS — E876 Hypokalemia: Secondary | ICD-10-CM | POA: Diagnosis not present

## 2019-04-07 DIAGNOSIS — Z1339 Encounter for screening examination for other mental health and behavioral disorders: Secondary | ICD-10-CM | POA: Diagnosis not present

## 2019-04-07 DIAGNOSIS — Z1331 Encounter for screening for depression: Secondary | ICD-10-CM | POA: Diagnosis not present

## 2019-04-12 ENCOUNTER — Telehealth: Payer: Self-pay | Admitting: Cardiovascular Disease

## 2019-04-13 ENCOUNTER — Telehealth: Payer: Self-pay

## 2019-04-13 ENCOUNTER — Telehealth (INDEPENDENT_AMBULATORY_CARE_PROVIDER_SITE_OTHER): Payer: Medicare HMO | Admitting: Cardiovascular Disease

## 2019-04-13 DIAGNOSIS — I5032 Chronic diastolic (congestive) heart failure: Secondary | ICD-10-CM

## 2019-04-13 DIAGNOSIS — I4811 Longstanding persistent atrial fibrillation: Secondary | ICD-10-CM | POA: Diagnosis not present

## 2019-04-13 DIAGNOSIS — E782 Mixed hyperlipidemia: Secondary | ICD-10-CM | POA: Diagnosis not present

## 2019-04-13 DIAGNOSIS — I10 Essential (primary) hypertension: Secondary | ICD-10-CM | POA: Diagnosis not present

## 2019-04-13 DIAGNOSIS — Z951 Presence of aortocoronary bypass graft: Secondary | ICD-10-CM

## 2019-04-13 DIAGNOSIS — G4733 Obstructive sleep apnea (adult) (pediatric): Secondary | ICD-10-CM | POA: Diagnosis not present

## 2019-04-13 MED ORDER — ATORVASTATIN CALCIUM 80 MG PO TABS: 80.0000 mg | ORAL_TABLET | Freq: Every day | ORAL | 3 refills | Status: DC

## 2019-04-13 NOTE — Progress Notes (Signed)
Virtual Visit via Telephone Note   This visit type was conducted due to national recommendations for restrictions regarding the COVID-19 Pandemic (e.g. social distancing) in an effort to limit this patient's exposure and mitigate transmission in our community.  Due to his co-morbid illnesses, this patient is at least at moderate risk for complications without adequate follow up.  This format is felt to be most appropriate for this patient at this time.  The patient did not have access to video technology/had technical difficulties with video requiring transitioning to audio format only (telephone).  All issues noted in this document were discussed and addressed.  No physical exam could be performed with this format.  Please refer to the patient's chart for his  consent to telehealth for [facility].   Date:  04/13/2019   ID:  Raymond Gordon, DOB [birth year], MRN [id number]  Patient Location: Home Provider Location: Home  PCP:  [name], Adelfa Koh, MD  Cardiologist: Dr. Nanetta Batty Electrophysiologist:  None   Evaluation Performed:  Follow-Up Visit  Chief Complaint: Annual follow-up CAD, atrial fibrillation, hypertension and hyperlipidemia  History of Present Illness:    Raymond Gordon is a 68 y.o.  severely overweight, single [place] American male with no children who I last saw in the office  04/07/2018. 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 His risk factors including noninsulin-requiring diabetes, hypertension, hyperlipidemia and remote tobacco abuse having quit smoking back in 1999 and had smoked 1 pack per day. His father did have an MI and had bypass surgery. He was admitted to [facility] July 17, 2011, with chest pain and A-fib. He was catheterized by Dr. [place] Hilty on July 18, 2011, revealing minimal luminal irregularities with small vessel disease and normal LV function. He ultimately spontaneously converted to sinus rhythm and was  placed on Pradaxa because of a CHADS2 score of 2. He has had no recurrent symptoms. He denies chest pain or shortness of breath.Dr. Wylene Simmer follows his lipid profile. He does give symptoms compatible with obstructive sleep apnea and currently wears CPAP with benefit. He was admitted to [facility] 07/08/15 with A. Fib with RVR and chest pain. His enzymes were negative. He is placed in IV due to diltiazem and converted spontaneously to sinus rhythm. His amlodipine was changed to by mouth diltiazem. He has had no recurrent symptoms. Since I saw him in the office back in August of last year he was admitted to [facility] on 08/13/15 through 08/22/15 with diastolic heart failure. He was diuresed 21 L and was educated about a low-salt diet.  Since I saw him a year ago he is remained stable.  He is sheltering in place and socially distancing.  He gets outside and walks without limitation.  He denies chest pain or shortness of breath.  He does wear his CPAP for his obstructive sleep apnea and watches his salt intake.  He has minimal peripheral edema.  The patient does not have symptoms concerning for COVID-19 infection (fever, chills, cough, or new shortness of breath).    Past Medical History:  Diagnosis Date   Bilateral lower extremity edema    Chronic diastolic CHF (congestive heart failure) (HCC) 08/15/2015   a. 06/2015: echo showing EF of 65-70% with Grade 2 DD   Diabetes (HCC)    INSULIN DEPENDENT   Dysrhythmia    Hyperlipidemia    Hypertension    Obesity    Obesity  Paroxysmal atrial fibrillation (HCC)    Sleep apnea    Past Surgical History:  Procedure Laterality Date   CARDIAC CATHETERIZATION     CARDIAC CATHETERIZATION N/A 2012   no large vessel occlusions     No outpatient medications have been marked as taking for the 04/13/19 encounter (Appointment) with Runell Gess, MD.     Allergies:   Patient has no known allergies.   Social History   Tobacco  Use   Smoking status: Former Smoker    Last attempt to quit: 11/25/1997    Years since quitting: 21.3   Smokeless tobacco: Former Neurosurgeon    Quit date: 06/14/2015  Substance Use Topics   Alcohol use: No    Alcohol/week: 0.0 standard drinks   Drug use: No     Family Hx: The patient's family history includes Cancer in his father; Diabetes in his maternal grandfather and mother; GER disease in his father; Heart failure in his brother; Hyperlipidemia in his brother, mother, and sister; Hypertension in his brother, maternal grandfather, maternal grandmother, mother, and sister; Stroke in his brother, father, and maternal grandmother.  ROS:   Please see the history of present illness.     All other systems reviewed and are negative.   Prior CV studies:   The following studies were reviewed today:  None  Labs/Other Tests and Data Reviewed:    EKG:  No ECG reviewed.  Recent Labs: No results found for requested labs within last 8760 hours.   Recent Lipid Panel Lab Results  Component Value Date/Time   CHOL 229 (H) 07/18/2011 03:45 AM   TRIG 101 07/18/2011 03:45 AM   HDL 46 07/18/2011 03:45 AM   CHOLHDL 5.0 07/18/2011 03:45 AM   LDLCALC 163 (H) 07/18/2011 03:45 AM    Wt Readings from Last 3 Encounters:  04/07/18 274 lb 6.4 oz (124.5 kg)  04/02/17 260 lb 9.6 oz (118.2 kg)  12/19/16 263 lb 12.8 oz (119.7 kg)     Objective:    Vital Signs:  There were no vitals taken for this visit.   VITAL SIGNS:  reviewed the patient was unable to check his vital signs at home.  A complete physical exam was not performed since this was a virtual telemedicine phone visit.  ASSESSMENT & PLAN:    1. Coronary artery disease- history of CAD status post CABG after myocardial infarction in 1999.  Cardiac catheterization performed by Dr. Rennis Golden 07/18/2011 revealed minimal irregularities and medical therapy was recommended.  He denies chest pain or shortness of breath. 2. Chronic atrial fibrillation-  rate controlled on Eliquis oral anticoagulation. 3. Essential hypertension- on amlodipine, carvedilol and losartan.  Unable to check his vital signs today. 4. Diastolic heart failure.  History of normal LV function with diastolic heart failure 08/13/2015.  He has minimal peripheral edema and does watch his salt intake.  He is on furosemide. 5. Obstructive sleep apnea- on CPAP 6. Hyperlipidemia- history of hyperlipidemia on atorvastatin with lipid profile performed 04/01/2019 revealing a total cholesterol 161, LDL of 105 and HDL 35.  COVID-19 Education: The signs and symptoms of COVID-19 were discussed with the patient and how to seek care for testing (follow up with PCP or arrange E-visit).  The importance of social distancing was discussed today.  Time:   Today, I have spent 5 minutes with the patient with telehealth technology discussing the above problems.     Medication Adjustments/Labs and Tests Ordered: Current medicines are reviewed at length with the patient today.  Concerns regarding medicines are outlined above.   Tests Ordered: No orders of the defined types were placed in this encounter.   Medication Changes: No orders of the defined types were placed in this encounter.   Disposition:  Follow up in 1 year(s)  Signed, Nanetta BattyJonathan Verita Kuroda, MD  04/13/2019 7:42 AM    [facility]

## 2019-04-13 NOTE — Patient Instructions (Signed)
Medication Instructions:  Your physician has recommended you make the following change in your medication:   INCREASE YOUR ATORVASTATIN (LIPITOR) TO 80 MG BY MOUTH DAILY  If you need a refill on your cardiac medications before your next appointment, please call your pharmacy.   Lab work: Your physician recommends that you return for lab work in 2-3 months. FASTING LIPID PROFILE AND LIVER FUNCTION TEST. YOU WILL RECEIVE A LAB SLIP IN THE MAIL. PLEASE DO NOT EAT OR DRINK (EXCEPT WATER) ANYTHING AFTER MIDNIGHT ON THE DAY YOU CHOOSE TO PRESENT FOR LAB WORK. YOU MAY EAT AFTER YOUR BLOOD HAS BEEN COLLECTED. NO APPOINTMENT IS NEEDED.  If you have labs (blood work) drawn today and your tests are completely normal, you will receive your results only by: Marland Kitchen MyChart Message (if you have MyChart) OR . A paper copy in the mail If you have any lab test that is abnormal or we need to change your treatment, we will call you to review the results.  Testing/Procedures: NONE  Follow-Up: At [facility], you and your health needs are our priority.  As part of our continuing mission to provide you with exceptional heart care, we have created designated Provider Care Teams.  These Care Teams include your primary Cardiologist (physician) and Advanced Practice Providers (APPs -  Physician Assistants and Nurse Practitioners) who all work together to provide you with the care you need, when you need it. You will need a follow up appointment in 12 months WITH DR. Allyson Sabal.  Please call our office 2 months in advance to schedule this appointment.

## 2019-04-13 NOTE — Telephone Encounter (Signed)
lmtcb to review AVS. Letter including After Visit Summary and any other necessary documents to be mailed to the patient's address on file.

## 2019-05-19 ENCOUNTER — Other Ambulatory Visit: Payer: Self-pay | Admitting: Cardiovascular Disease

## 2019-05-19 NOTE — Telephone Encounter (Signed)
67 M, 124.5 kg SCr 1.1 (03/2019 -K[facility]) LOV 5/2020JB AF

## 2019-06-17 ENCOUNTER — Other Ambulatory Visit: Payer: Self-pay | Admitting: Cardiovascular Disease

## 2019-06-18 NOTE — Telephone Encounter (Signed)
Rx(s) sent to pharmacy electronically.  

## 2019-06-24 DIAGNOSIS — Z794 Long term (current) use of insulin: Secondary | ICD-10-CM | POA: Diagnosis not present

## 2019-06-24 DIAGNOSIS — E1159 Type 2 diabetes mellitus with other circulatory complications: Secondary | ICD-10-CM | POA: Diagnosis not present

## 2019-06-24 DIAGNOSIS — I1310 Hypertensive heart and chronic kidney disease without heart failure, with stage 1 through stage 4 chronic kidney disease, or unspecified chronic kidney disease: Secondary | ICD-10-CM | POA: Diagnosis not present

## 2019-06-24 DIAGNOSIS — Z9119 Patient's noncompliance with other medical treatment and regimen: Secondary | ICD-10-CM | POA: Diagnosis not present

## 2019-07-09 DIAGNOSIS — E1151 Type 2 diabetes mellitus with diabetic peripheral angiopathy without gangrene: Secondary | ICD-10-CM | POA: Diagnosis not present

## 2019-07-09 DIAGNOSIS — Z7901 Long term (current) use of anticoagulants: Secondary | ICD-10-CM | POA: Diagnosis not present

## 2019-07-09 DIAGNOSIS — E1129 Type 2 diabetes mellitus with other diabetic kidney complication: Secondary | ICD-10-CM | POA: Diagnosis not present

## 2019-07-09 DIAGNOSIS — Z9119 Patient's noncompliance with other medical treatment and regimen: Secondary | ICD-10-CM | POA: Diagnosis not present

## 2019-07-09 DIAGNOSIS — I209 Angina pectoris, unspecified: Secondary | ICD-10-CM | POA: Diagnosis not present

## 2019-07-09 DIAGNOSIS — Z794 Long term (current) use of insulin: Secondary | ICD-10-CM | POA: Diagnosis not present

## 2019-07-09 DIAGNOSIS — I5030 Unspecified diastolic (congestive) heart failure: Secondary | ICD-10-CM | POA: Diagnosis not present

## 2019-07-09 DIAGNOSIS — G4733 Obstructive sleep apnea (adult) (pediatric): Secondary | ICD-10-CM | POA: Diagnosis not present

## 2019-07-09 DIAGNOSIS — I119 Hypertensive heart disease without heart failure: Secondary | ICD-10-CM | POA: Diagnosis not present

## 2019-07-29 ENCOUNTER — Other Ambulatory Visit: Payer: Self-pay | Admitting: Cardiovascular Disease

## 2019-07-29 NOTE — Telephone Encounter (Signed)
40m 120.2kg Scr 1.1 04/01/19 Lovw/berry 04/13/19

## 2019-07-29 NOTE — Telephone Encounter (Signed)
Refill request

## 2019-10-26 ENCOUNTER — Other Ambulatory Visit: Payer: Self-pay | Admitting: Cardiovascular Disease

## 2020-01-02 ENCOUNTER — Other Ambulatory Visit: Payer: Self-pay | Admitting: Cardiovascular Disease

## 2020-01-03 ENCOUNTER — Ambulatory Visit: Payer: Medicare HMO

## 2020-02-29 ENCOUNTER — Ambulatory Visit: Payer: Medicare HMO | Admitting: Cardiovascular Disease

## 2020-03-04 ENCOUNTER — Other Ambulatory Visit: Payer: Self-pay | Admitting: Cardiovascular Disease

## 2020-03-17 DIAGNOSIS — R972 Elevated prostate specific antigen [PSA]: Secondary | ICD-10-CM | POA: Diagnosis not present

## 2020-03-17 DIAGNOSIS — N401 Enlarged prostate with lower urinary tract symptoms: Secondary | ICD-10-CM | POA: Diagnosis not present

## 2020-03-17 DIAGNOSIS — R350 Frequency of micturition: Secondary | ICD-10-CM | POA: Diagnosis not present

## 2020-03-22 DIAGNOSIS — E1159 Type 2 diabetes mellitus with other circulatory complications: Secondary | ICD-10-CM | POA: Diagnosis not present

## 2020-03-22 DIAGNOSIS — Z9119 Patient's noncompliance with other medical treatment and regimen: Secondary | ICD-10-CM | POA: Diagnosis not present

## 2020-03-22 DIAGNOSIS — I119 Hypertensive heart disease without heart failure: Secondary | ICD-10-CM | POA: Diagnosis not present

## 2020-03-22 DIAGNOSIS — Z794 Long term (current) use of insulin: Secondary | ICD-10-CM | POA: Diagnosis not present

## 2020-03-24 ENCOUNTER — Other Ambulatory Visit: Payer: Self-pay

## 2020-03-24 ENCOUNTER — Ambulatory Visit: Payer: Medicare HMO | Admitting: Cardiovascular Disease

## 2020-03-24 ENCOUNTER — Encounter: Payer: Self-pay | Admitting: Cardiovascular Disease

## 2020-03-24 DIAGNOSIS — I10 Essential (primary) hypertension: Secondary | ICD-10-CM

## 2020-03-24 DIAGNOSIS — E782 Mixed hyperlipidemia: Secondary | ICD-10-CM | POA: Diagnosis not present

## 2020-03-24 DIAGNOSIS — I5032 Chronic diastolic (congestive) heart failure: Secondary | ICD-10-CM | POA: Diagnosis not present

## 2020-03-24 DIAGNOSIS — I480 Paroxysmal atrial fibrillation: Secondary | ICD-10-CM

## 2020-03-24 NOTE — Assessment & Plan Note (Signed)
History of chronic diastolic heart failure not on oral diuretics.  He is aware of salt avoidance.

## 2020-03-24 NOTE — Assessment & Plan Note (Signed)
History of hyperlipidemia on statin therapy with lipid profile performed 04/01/2019 revealing total cholesterol 161, LDL 105 and HDL of 35.

## 2020-03-24 NOTE — Assessment & Plan Note (Signed)
History of essential hypertension a blood pressure measured at 132/58.  He is on amlodipine and losartan as well as carvedilol.

## 2020-03-24 NOTE — Progress Notes (Signed)
03/24/2020 Raymond Gordon   1951/07/14  [id number]  Primary Physician [name], Adelfa Koh, MD Primary Cardiologist: Runell Gess MD FACP, [place], [place], [place]  HPI:  Raymond Gordon is a 69 y.o.  severely overweight, single African American male with no children who I last spoke to for a virtual telemedicine phone visit 04/13/2019.  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 His risk factors including noninsulin-requiring diabetes, hypertension, hyperlipidemia and remote tobacco abuse having quit smoking back in 1999 and had smoked 1 pack per day. His father did have an MI and had bypass surgery. He was admitted to [facility] July 17, 2011, with chest pain and A-fib. He was catheterized by Dr. [place] Hilty on July 18, 2011, revealing minimal luminal irregularities with small vessel disease and normal LV function. He ultimately spontaneously converted to sinus rhythm and was placed on Pradaxa because of a CHADS2 score of 2. He has had no recurrent symptoms. He denies chest pain or shortness of breath.Dr. Wylene Simmer follows his lipid profile. He does give symptoms compatible with obstructive sleep apnea and currently wears CPAP with benefit. He was admitted to [facility] 07/08/15 with A. Fib with RVR and chest pain. His enzymes were negative. He is placed in IV due to diltiazem and converted spontaneously to sinus rhythm. His amlodipine was changed to by mouth diltiazem. He has had no recurrent symptoms. Since I saw him in the office back in August of last year he was admitted to [facility] on 08/13/15 through 08/22/15 with diastolic heart failure. He was diuresed 21 L and was educated about a low-salt diet.  Since I spoke to him a year ago for a virtual telemedicine phone visit he continues to do well.  He is being evaluated for elevated PSA and prostate enlargement ruling out prostate cancer.  He does avoid salt, and has had no further episodes of  congestive heart failure.  He is maintaining sinus rhythm on Eliquis oral anticoagulation.  He denies chest pain or shortness of breath.    Current Meds  Medication Sig  . allopurinol (ZYLOPRIM) 300 MG tablet Take 300 mg by mouth daily.   Marland Kitchen amLODipine (NORVASC) 10 MG tablet Take 1 tablet by mouth once daily  . aspirin EC 81 MG tablet Take 81 mg by mouth daily.  Marland Kitchen atorvastatin (LIPITOR) 80 MG tablet Take 1 tablet (80 mg total) by mouth daily.  . carvedilol (COREG) 25 MG tablet TAKE ONE & ONE-HALF TABLETS BY MOUTH TWICE DAILY  . doxazosin (CARDURA) 4 MG tablet Take 4 mg by mouth daily.   Marland Kitchen ELIQUIS 5 MG TABS tablet Take 1 tablet by mouth twice daily  . FARXIGA 10 MG TABS tablet   . furosemide (LASIX) 80 MG tablet Take 1 tablet (80 mg total) by mouth 2 (two) times daily.  Marland Kitchen HUMULIN R U-500 KWIKPEN 500 UNIT/ML kwikpen INJECT 80 UNITS SUBCUTANEOUSLY WITH BREAKFAST AND 60 WITH LUNCH AND 80 WITH SUPPER  . insulin aspart (NOVOLOG) 100 UNIT/ML injection Inject 30-60 Units into the skin 3 (three) times daily before meals.  . isosorbide mononitrate (IMDUR) 30 MG 24 hr tablet Take 1 tablet (30 mg total) by mouth daily.  . Lancets (ONETOUCH DELICA PLUS LANCET33G) MISC USE TO SELF MONITOR BLOOD GLUCOSE 4 TIMES DAILY  . losartan (COZAAR) 100 MG tablet Take 100 mg by mouth daily.  . Multiple Vitamin (MULTIVITAMIN WITH MINERALS) TABS tablet Take  1 tablet by mouth daily.  Marland Kitchen omeprazole (PRILOSEC OTC) 20 MG tablet Take 1 tablet (20 mg total) by mouth daily.  Marland Kitchen omeprazole (PRILOSEC) 20 MG capsule Take 20 mg by mouth daily.  Letta Pate VERIO test strip USE 1 STRIP TO CHECK GLUCOSE 4 TIMES DAILY  . PRESCRIPTION MEDICATION Inhale into the lungs at bedtime. Pt is on C Pap machine  . Sulfamethoxazole-Trimethoprim (SULFAMETHOXAZOLE-TMP DS PO) Take by mouth 2 (two) times daily.  . tamsulosin (FLOMAX) 0.4 MG CAPS capsule Take 0.4 mg by mouth daily after supper.  . valsartan (DIOVAN) 160 MG tablet Take 1 tablet (160 mg  total) by mouth daily.  . [DISCONTINUED] cephALEXin (KEFLEX) 500 MG capsule Take 1 capsule (500 mg total) by mouth 3 (three) times daily.     No Known Allergies  Social History   Socioeconomic History  . Marital status: Single    Spouse name: Not on file  . Number of children: Not on file  . Years of education: Not on file  . Highest education level: Not on file  Occupational History  . Not on file  Tobacco Use  . Smoking status: Former Smoker    Quit date: 11/25/1997    Years since quitting: 22.3  . Smokeless tobacco: Former Neurosurgeon    Quit date: 06/14/2015  Substance and Sexual Activity  . Alcohol use: No    Alcohol/week: 0.0 standard drinks  . Drug use: No  . Sexual activity: Not on file  Other Topics Concern  . Not on file  Social History Narrative  . Not on file   Social Determinants of Health   Financial Resource Strain:   . Difficulty of Paying Living Expenses:   Food Insecurity:   . Worried About Programme researcher, broadcasting/film/video in the Last Year:   . Barista in the Last Year:   Transportation Needs:   . Freight forwarder (Medical):   Marland Kitchen Lack of Transportation (Non-Medical):   Physical Activity:   . Days of Exercise per Week:   . Minutes of Exercise per Session:   Stress:   . Feeling of Stress :   Social Connections:   . Frequency of Communication with Friends and Family:   . Frequency of Social Gatherings with Friends and Family:   . Attends Religious Services:   . Active Member of Clubs or Organizations:   . Attends Banker Meetings:   Marland Kitchen Marital Status:   Intimate Partner Violence:   . Fear of Current or Ex-Partner:   . Emotionally Abused:   Marland Kitchen Physically Abused:   . Sexually Abused:      Review of Systems: General: negative for chills, fever, night sweats or weight changes.  Cardiovascular: negative for chest pain, dyspnea on exertion, edema, orthopnea, palpitations, paroxysmal nocturnal dyspnea or shortness of breath Dermatological:  negative for rash Respiratory: negative for cough or wheezing Urologic: negative for hematuria Abdominal: negative for nausea, vomiting, diarrhea, bright red blood per rectum, melena, or hematemesis Neurologic: negative for visual changes, syncope, or dizziness All other systems reviewed and are otherwise negative except as noted above.    Blood pressure (!) 132/58, pulse 81, height 5\' 5"  (1.651 m), weight 233 lb 3.2 oz (105.8 kg), SpO2 92 %.  General appearance: alert and no distress Neck: no adenopathy, no carotid bruit, no JVD, supple, symmetrical, trachea midline and thyroid not enlarged, symmetric, no tenderness/mass/nodules Lungs: clear to auscultation bilaterally Heart: regular rate and rhythm, S1, S2 normal, no murmur, click,  rub or gallop Extremities: extremities normal, atraumatic, no cyanosis or edema Pulses: 2+ and symmetric Skin: Skin color, texture, turgor normal. No rashes or lesions Neurologic: Alert and oriented X 3, normal strength and tone. Normal symmetric reflexes. Normal coordination and gait  EKG sinus rhythm 81 without ST or T wave changes.  I personally reviewed this EKG.  ASSESSMENT AND PLAN:   Paroxysmal atrial fibrillation History of PAF maintaining sinus rhythm on Eliquis oral anticoagulation.  He apparently needs some periodontal work in the near future.  He would be okay for him to interrupt his anticoagulation for this.  Essential hypertension History of essential hypertension a blood pressure measured at 132/58.  He is on amlodipine and losartan as well as carvedilol.  HLD (hyperlipidemia) History of hyperlipidemia on statin therapy with lipid profile performed 04/01/2019 revealing total cholesterol 161, LDL 105 and HDL of 35.  CHF (congestive heart failure), NYHA class I (HCC) History of chronic diastolic heart failure not on oral diuretics.  He is aware of salt avoidance.      Lorretta Harp MD FACP,FACC,FAHA, [facility] 03/24/2020 10:35 AM

## 2020-03-24 NOTE — Assessment & Plan Note (Addendum)
History of PAF maintaining sinus rhythm on Eliquis oral anticoagulation.  He apparently needs some periodontal work in the near future.  He would be okay for him to interrupt his anticoagulation for this.

## 2020-03-24 NOTE — Patient Instructions (Signed)

## 2020-04-03 DIAGNOSIS — N419 Inflammatory disease of prostate, unspecified: Secondary | ICD-10-CM | POA: Diagnosis not present

## 2020-04-03 DIAGNOSIS — Z0000 Encounter for general adult medical examination without abnormal findings: Secondary | ICD-10-CM | POA: Diagnosis not present

## 2020-04-03 DIAGNOSIS — E7800 Pure hypercholesterolemia, unspecified: Secondary | ICD-10-CM | POA: Diagnosis not present

## 2020-04-03 DIAGNOSIS — E1151 Type 2 diabetes mellitus with diabetic peripheral angiopathy without gangrene: Secondary | ICD-10-CM | POA: Diagnosis not present

## 2020-04-03 DIAGNOSIS — M109 Gout, unspecified: Secondary | ICD-10-CM | POA: Diagnosis not present

## 2020-04-03 DIAGNOSIS — Z125 Encounter for screening for malignant neoplasm of prostate: Secondary | ICD-10-CM | POA: Diagnosis not present

## 2020-04-10 DIAGNOSIS — Z1331 Encounter for screening for depression: Secondary | ICD-10-CM | POA: Diagnosis not present

## 2020-04-10 DIAGNOSIS — R82998 Other abnormal findings in urine: Secondary | ICD-10-CM | POA: Diagnosis not present

## 2020-04-10 DIAGNOSIS — I480 Paroxysmal atrial fibrillation: Secondary | ICD-10-CM | POA: Diagnosis not present

## 2020-04-10 DIAGNOSIS — E1129 Type 2 diabetes mellitus with other diabetic kidney complication: Secondary | ICD-10-CM | POA: Diagnosis not present

## 2020-04-10 DIAGNOSIS — Z794 Long term (current) use of insulin: Secondary | ICD-10-CM | POA: Diagnosis not present

## 2020-04-10 DIAGNOSIS — E1159 Type 2 diabetes mellitus with other circulatory complications: Secondary | ICD-10-CM | POA: Diagnosis not present

## 2020-04-10 DIAGNOSIS — D6859 Other primary thrombophilia: Secondary | ICD-10-CM | POA: Insufficient documentation

## 2020-04-10 DIAGNOSIS — Z1339 Encounter for screening examination for other mental health and behavioral disorders: Secondary | ICD-10-CM | POA: Diagnosis not present

## 2020-04-10 DIAGNOSIS — Z7901 Long term (current) use of anticoagulants: Secondary | ICD-10-CM | POA: Diagnosis not present

## 2020-04-10 DIAGNOSIS — Z0000 Encounter for general adult medical examination without abnormal findings: Secondary | ICD-10-CM | POA: Diagnosis not present

## 2020-04-10 DIAGNOSIS — Z9119 Patient's noncompliance with other medical treatment and regimen: Secondary | ICD-10-CM | POA: Diagnosis not present

## 2020-04-10 DIAGNOSIS — E7800 Pure hypercholesterolemia, unspecified: Secondary | ICD-10-CM | POA: Diagnosis not present

## 2020-04-10 DIAGNOSIS — I1310 Hypertensive heart and chronic kidney disease without heart failure, with stage 1 through stage 4 chronic kidney disease, or unspecified chronic kidney disease: Secondary | ICD-10-CM | POA: Diagnosis not present

## 2020-04-25 DIAGNOSIS — N401 Enlarged prostate with lower urinary tract symptoms: Secondary | ICD-10-CM | POA: Diagnosis not present

## 2020-04-25 DIAGNOSIS — R350 Frequency of micturition: Secondary | ICD-10-CM | POA: Diagnosis not present

## 2020-04-25 DIAGNOSIS — R972 Elevated prostate specific antigen [PSA]: Secondary | ICD-10-CM | POA: Diagnosis not present

## 2020-04-25 DIAGNOSIS — Z8042 Family history of malignant neoplasm of prostate: Secondary | ICD-10-CM | POA: Diagnosis not present

## 2020-05-12 ENCOUNTER — Other Ambulatory Visit: Payer: Self-pay | Admitting: Cardiovascular Disease

## 2020-05-12 NOTE — Telephone Encounter (Signed)
Prescription refill request for Eliquis received.  Last office visit: Raymond Gordon 03/24/2020 Scr:  Age:  Weight:

## 2020-05-15 NOTE — Telephone Encounter (Signed)
68 M 105.8 kg, SCr 0.9 (5/21 K[facility]), LOV Allyson Sabal 4/21

## 2020-06-03 ENCOUNTER — Other Ambulatory Visit: Payer: Self-pay | Admitting: Cardiovascular Disease

## 2020-06-20 DIAGNOSIS — E1159 Type 2 diabetes mellitus with other circulatory complications: Secondary | ICD-10-CM | POA: Diagnosis not present

## 2020-06-20 DIAGNOSIS — I1310 Hypertensive heart and chronic kidney disease without heart failure, with stage 1 through stage 4 chronic kidney disease, or unspecified chronic kidney disease: Secondary | ICD-10-CM | POA: Diagnosis not present

## 2020-06-20 DIAGNOSIS — Z794 Long term (current) use of insulin: Secondary | ICD-10-CM | POA: Diagnosis not present

## 2020-07-24 DIAGNOSIS — Z794 Long term (current) use of insulin: Secondary | ICD-10-CM | POA: Diagnosis not present

## 2020-07-24 DIAGNOSIS — I110 Hypertensive heart disease with heart failure: Secondary | ICD-10-CM | POA: Diagnosis not present

## 2020-07-24 DIAGNOSIS — E7800 Pure hypercholesterolemia, unspecified: Secondary | ICD-10-CM | POA: Diagnosis not present

## 2020-07-24 DIAGNOSIS — D6869 Other thrombophilia: Secondary | ICD-10-CM | POA: Diagnosis not present

## 2020-07-24 DIAGNOSIS — E1151 Type 2 diabetes mellitus with diabetic peripheral angiopathy without gangrene: Secondary | ICD-10-CM | POA: Diagnosis not present

## 2020-07-24 DIAGNOSIS — I5030 Unspecified diastolic (congestive) heart failure: Secondary | ICD-10-CM | POA: Diagnosis not present

## 2020-07-24 DIAGNOSIS — I209 Angina pectoris, unspecified: Secondary | ICD-10-CM | POA: Diagnosis not present

## 2020-07-24 DIAGNOSIS — I480 Paroxysmal atrial fibrillation: Secondary | ICD-10-CM | POA: Diagnosis not present

## 2020-07-24 DIAGNOSIS — Z7901 Long term (current) use of anticoagulants: Secondary | ICD-10-CM | POA: Diagnosis not present

## 2020-07-27 DIAGNOSIS — N401 Enlarged prostate with lower urinary tract symptoms: Secondary | ICD-10-CM | POA: Diagnosis not present

## 2020-07-27 DIAGNOSIS — R350 Frequency of micturition: Secondary | ICD-10-CM | POA: Diagnosis not present

## 2020-07-27 DIAGNOSIS — R972 Elevated prostate specific antigen [PSA]: Secondary | ICD-10-CM | POA: Diagnosis not present

## 2020-07-27 DIAGNOSIS — Z8042 Family history of malignant neoplasm of prostate: Secondary | ICD-10-CM | POA: Diagnosis not present

## 2020-11-03 DIAGNOSIS — Z794 Long term (current) use of insulin: Secondary | ICD-10-CM | POA: Diagnosis not present

## 2020-11-03 DIAGNOSIS — I480 Paroxysmal atrial fibrillation: Secondary | ICD-10-CM | POA: Diagnosis not present

## 2020-11-03 DIAGNOSIS — D6869 Other thrombophilia: Secondary | ICD-10-CM | POA: Diagnosis not present

## 2020-11-03 DIAGNOSIS — E1151 Type 2 diabetes mellitus with diabetic peripheral angiopathy without gangrene: Secondary | ICD-10-CM | POA: Diagnosis not present

## 2020-11-03 DIAGNOSIS — E7800 Pure hypercholesterolemia, unspecified: Secondary | ICD-10-CM | POA: Diagnosis not present

## 2020-11-03 DIAGNOSIS — Z7901 Long term (current) use of anticoagulants: Secondary | ICD-10-CM | POA: Diagnosis not present

## 2020-11-03 DIAGNOSIS — I110 Hypertensive heart disease with heart failure: Secondary | ICD-10-CM | POA: Diagnosis not present

## 2020-11-03 DIAGNOSIS — I5030 Unspecified diastolic (congestive) heart failure: Secondary | ICD-10-CM | POA: Diagnosis not present

## 2020-11-03 DIAGNOSIS — I209 Angina pectoris, unspecified: Secondary | ICD-10-CM | POA: Diagnosis not present

## 2020-11-20 ENCOUNTER — Other Ambulatory Visit: Payer: Self-pay | Admitting: Cardiovascular Disease

## 2021-01-03 DIAGNOSIS — H2513 Age-related nuclear cataract, bilateral: Secondary | ICD-10-CM | POA: Diagnosis not present

## 2021-01-03 DIAGNOSIS — E119 Type 2 diabetes mellitus without complications: Secondary | ICD-10-CM | POA: Diagnosis not present

## 2021-01-03 DIAGNOSIS — H524 Presbyopia: Secondary | ICD-10-CM | POA: Diagnosis not present

## 2021-01-31 DIAGNOSIS — Z7901 Long term (current) use of anticoagulants: Secondary | ICD-10-CM | POA: Diagnosis not present

## 2021-01-31 DIAGNOSIS — Z1339 Encounter for screening examination for other mental health and behavioral disorders: Secondary | ICD-10-CM | POA: Diagnosis not present

## 2021-01-31 DIAGNOSIS — Z794 Long term (current) use of insulin: Secondary | ICD-10-CM | POA: Diagnosis not present

## 2021-01-31 DIAGNOSIS — Z741 Need for assistance with personal care: Secondary | ICD-10-CM | POA: Diagnosis not present

## 2021-01-31 DIAGNOSIS — I209 Angina pectoris, unspecified: Secondary | ICD-10-CM | POA: Diagnosis not present

## 2021-01-31 DIAGNOSIS — Z1331 Encounter for screening for depression: Secondary | ICD-10-CM | POA: Diagnosis not present

## 2021-01-31 DIAGNOSIS — I480 Paroxysmal atrial fibrillation: Secondary | ICD-10-CM | POA: Diagnosis not present

## 2021-01-31 DIAGNOSIS — D6869 Other thrombophilia: Secondary | ICD-10-CM | POA: Diagnosis not present

## 2021-01-31 DIAGNOSIS — I5030 Unspecified diastolic (congestive) heart failure: Secondary | ICD-10-CM | POA: Diagnosis not present

## 2021-01-31 DIAGNOSIS — E1165 Type 2 diabetes mellitus with hyperglycemia: Secondary | ICD-10-CM | POA: Diagnosis not present

## 2021-01-31 DIAGNOSIS — E1151 Type 2 diabetes mellitus with diabetic peripheral angiopathy without gangrene: Secondary | ICD-10-CM | POA: Diagnosis not present

## 2021-01-31 DIAGNOSIS — I110 Hypertensive heart disease with heart failure: Secondary | ICD-10-CM | POA: Diagnosis not present

## 2021-03-22 ENCOUNTER — Encounter (INDEPENDENT_AMBULATORY_CARE_PROVIDER_SITE_OTHER): Payer: Self-pay

## 2021-03-22 DIAGNOSIS — H18413 Arcus senilis, bilateral: Secondary | ICD-10-CM | POA: Diagnosis not present

## 2021-03-22 DIAGNOSIS — H26493 Other secondary cataract, bilateral: Secondary | ICD-10-CM | POA: Diagnosis not present

## 2021-03-22 DIAGNOSIS — E119 Type 2 diabetes mellitus without complications: Secondary | ICD-10-CM | POA: Diagnosis not present

## 2021-03-22 DIAGNOSIS — Z961 Presence of intraocular lens: Secondary | ICD-10-CM | POA: Diagnosis not present

## 2021-04-03 ENCOUNTER — Other Ambulatory Visit: Payer: Self-pay

## 2021-04-03 ENCOUNTER — Ambulatory Visit (INDEPENDENT_AMBULATORY_CARE_PROVIDER_SITE_OTHER): Payer: Medicare HMO | Admitting: Ophthalmology

## 2021-04-03 ENCOUNTER — Encounter (INDEPENDENT_AMBULATORY_CARE_PROVIDER_SITE_OTHER): Payer: Self-pay | Admitting: Ophthalmology

## 2021-04-03 DIAGNOSIS — H26492 Other secondary cataract, left eye: Secondary | ICD-10-CM | POA: Diagnosis not present

## 2021-04-03 DIAGNOSIS — H26491 Other secondary cataract, right eye: Secondary | ICD-10-CM | POA: Diagnosis not present

## 2021-04-03 DIAGNOSIS — H26493 Other secondary cataract, bilateral: Secondary | ICD-10-CM | POA: Diagnosis not present

## 2021-04-03 DIAGNOSIS — E113393 Type 2 diabetes mellitus with moderate nonproliferative diabetic retinopathy without macular edema, bilateral: Secondary | ICD-10-CM | POA: Diagnosis not present

## 2021-04-03 DIAGNOSIS — H43823 Vitreomacular adhesion, bilateral: Secondary | ICD-10-CM

## 2021-04-03 NOTE — Assessment & Plan Note (Signed)
Minor OS at this time no impact on acuity

## 2021-04-03 NOTE — Progress Notes (Signed)
04/03/2021     CHIEF COMPLAINT Patient presents for Retina Evaluation (NP "Nevus" eval OD - Ref'd by Dr. Jacqulyn Cane denies noticeable changes to [facility] recently OU. Pt reports occasional floaters off and on OU x "years." Pt denies ocular pain or flashes of light OU./A1c: 8.7, "this year"/LBS: 168 this AM)   HISTORY OF PRESENT ILLNESS: Raymond Gordon is a 70 y.o. male who presents to the clinic today for:   HPI    Retina Evaluation    Laterality: both eyes   Onset: years ago   Duration: years   Associated Symptoms: Floaters   Treatments tried: no treatments   Comments: NP "Nevus" eval OD - Ref'd by Dr. Vonna Kotyk  Pt denies noticeable changes to VA recently OU. Pt reports occasional floaters off and on OU x "years." Pt denies ocular pain or flashes of light OU. A1c: 8.7, "this year" LBS: 168 this AM          Comments    Onset of diabetes mellitus some 32 years prior       Last edited by Edmon Crape, MD on 04/03/2021  4:29 PM. (History)      Referring physician: Gaspar Garbe, MD [address] [place],  [name] [address]  HISTORICAL INFORMATION:   Selected notes from the MEDICAL RECORD NUMBER    Lab Results  Component Value Date   HGBA1C 8.7 (H) 08/14/2015     CURRENT MEDICATIONS: No current outpatient medications on file. (Ophthalmic Drugs)   No current facility-administered medications for this visit. (Ophthalmic Drugs)   Current Outpatient Medications (Other)  Medication Sig  . allopurinol (ZYLOPRIM) 300 MG tablet Take 300 mg by mouth daily.   Marland Kitchen amLODipine (NORVASC) 10 MG tablet Take 1 tablet by mouth once daily  . aspirin EC 81 MG tablet Take 81 mg by mouth daily.  Marland Kitchen atorvastatin (LIPITOR) 80 MG tablet Take 1 tablet by mouth once daily  . carvedilol (COREG) 25 MG tablet TAKE ONE & ONE-HALF TABLETS BY MOUTH TWICE DAILY  . doxazosin (CARDURA) 4 MG tablet Take 4 mg by mouth daily.   Marland Kitchen ELIQUIS 5 MG TABS tablet Take 1 tablet by mouth twice daily  . FARXIGA 10 MG  TABS tablet   . furosemide (LASIX) 80 MG tablet Take 1 tablet (80 mg total) by mouth 2 (two) times daily.  Marland Kitchen HUMULIN R U-500 KWIKPEN 500 UNIT/ML kwikpen INJECT 80 UNITS SUBCUTANEOUSLY WITH BREAKFAST AND 60 WITH LUNCH AND 80 WITH SUPPER  . insulin aspart (NOVOLOG) 100 UNIT/ML injection Inject 30-60 Units into the skin 3 (three) times daily before meals.  . isosorbide mononitrate (IMDUR) 30 MG 24 hr tablet Take 1 tablet (30 mg total) by mouth daily.  . Lancets (ONETOUCH DELICA PLUS LANCET33G) MISC USE TO SELF MONITOR BLOOD GLUCOSE 4 TIMES DAILY  . losartan (COZAAR) 100 MG tablet Take 100 mg by mouth daily.  . Multiple Vitamin (MULTIVITAMIN WITH MINERALS) TABS tablet Take 1 tablet by mouth daily.  Marland Kitchen omeprazole (PRILOSEC OTC) 20 MG tablet Take 1 tablet (20 mg total) by mouth daily.  Marland Kitchen omeprazole (PRILOSEC) 20 MG capsule Take 20 mg by mouth daily.  Letta Pate VERIO test strip USE 1 STRIP TO CHECK GLUCOSE 4 TIMES DAILY  . PRESCRIPTION MEDICATION Inhale into the lungs at bedtime. Pt is on C Pap machine  . Sulfamethoxazole-Trimethoprim (SULFAMETHOXAZOLE-TMP DS PO) Take by mouth 2 (two) times daily.  . tamsulosin (FLOMAX) 0.4 MG CAPS capsule Take 0.4 mg by mouth daily after  supper.  . valsartan (DIOVAN) 160 MG tablet Take 1 tablet (160 mg total) by mouth daily.   No current facility-administered medications for this visit. (Other)      REVIEW OF SYSTEMS:    ALLERGIES No Known Allergies  PAST MEDICAL HISTORY Past Medical History:  Diagnosis Date  . Bilateral lower extremity edema   . Chronic diastolic CHF (congestive heart failure) (HCC) 08/15/2015   a. 06/2015: echo showing EF of 65-70% with Grade 2 DD  . Diabetes (HCC)    INSULIN DEPENDENT  . Dysrhythmia   . Hyperlipidemia   . Hypertension   . Obesity   . Obesity   . Paroxysmal atrial fibrillation (HCC)   . Sleep apnea    Past Surgical History:  Procedure Laterality Date  . CARDIAC CATHETERIZATION    . CARDIAC CATHETERIZATION  N/A 2012   no large vessel occlusions    FAMILY HISTORY Family History  Problem Relation Age of Onset  . Heart failure Brother   . Hyperlipidemia Brother   . Hypertension Brother   . Stroke Brother   . Hypertension Mother   . Hyperlipidemia Mother   . Diabetes Mother   . Cancer Father        prostate  . GER disease Father   . Stroke Father   . Hypertension Sister   . Hyperlipidemia Sister   . Hypertension Maternal Grandmother   . Stroke Maternal Grandmother   . Diabetes Maternal Grandfather   . Hypertension Maternal Grandfather     SOCIAL HISTORY Social History   Tobacco Use  . Smoking status: Former Smoker    Quit date: 11/25/1997    Years since quitting: 23.3  . Smokeless tobacco: Former NeurosurgeonUser    Quit date: 06/14/2015  Substance Use Topics  . Alcohol use: No    Alcohol/week: 0.0 standard drinks  . Drug use: No         OPHTHALMIC EXAM:  Base Eye Exam    Visual Acuity (ETDRS)      Right Left   Dist cc 20/30 +1 20/20   Dist ph cc 20/25 -2    Correction: Glasses       Tonometry (Tonopen, 3:26 PM)      Right Left   Pressure 14 12       Pupils      Pupils Dark Light Shape React APD   Right PERRL 4 3 Round Slow None   Left PERRL 4 3 Round Slow None       Visual Fields (Counting fingers)      Left Right    Full Full       Extraocular Movement      Right Left    Full Full       Neuro/Psych    Oriented x3: Yes   Mood/Affect: Normal       Dilation    Both eyes: 1.0% Mydriacyl, 2.5% Phenylephrine @ 3:26 PM        Slit Lamp and Fundus Exam    External Exam      Right Left   External Normal Normal       Slit Lamp Exam      Right Left   Lids/Lashes Normal Normal   Conjunctiva/Sclera White and quiet White and quiet   Cornea Clear Clear   Anterior Chamber Deep and quiet Deep and quiet   Iris Round and reactive Round and reactive   Lens Centered posterior chamber intraocular lens, 3+ Posterior capsular opacification Centered posterior  chamber intraocular lens, trace PCO   Anterior Vitreous Normal Normal       Fundus Exam      Right Left   Posterior Vitreous vit pap traction, vit veils Normal   Disc Normal Normal   C/D Ratio 0.35 0.3   Macula Rare microaneurysm, no macular thickening, no clinically significant macular edema Rare microaneurysm, no macular thickening, no clinically significant macular edema   Vessels NPDR- Moderate NPDR- Moderate   Periphery In the temporal periphery, old white nonperfused neovascularization elsewhere which are apparently amputated from the posterior attachments, there is no associated mass no associated fluid, inactive,, 20 D and 20 5D examination scleral depression temporal Normal,, 20 5D scleral depression temporally and 360          IMAGING AND PROCEDURES  Imaging and Procedures for 04/03/21  Color Fundus Photography Optos - OU - Both Eyes       Right Eye Progression has no prior data. Disc findings include normal observations. Macula : microaneurysms. Vessels : normal observations.   Left Eye Progression has no prior data. Disc findings include normal observations. Macula : microaneurysms. Vessels : normal observations.   Notes Old nonperfused neovascular tissue, white fibrotic far temporal periphery, no tractional changes, observe OD       OCT, Retina - OU - Both Eyes       Right Eye Quality was good. Scan locations included subfoveal. Central Foveal Thickness: 285. Progression has no prior data. Findings include vitreous traction.   Left Eye Quality was poor. Scan locations included subfoveal. Progression has no prior data. Findings include vitreomacular adhesion .   Notes No active maculopathy in either eye.  OD with vitreous traction inferonasal temporal to the nerve with no impact on acuity nor macula.  Observe  OS, physiologic vitreomacular adhesion with no tractional changes upon the fovea no active macular edema                 ASSESSMENT/PLAN:  Posterior capsular opacification, right eye Very dense posterior capsule opacification.  I do recommend YAG capsulotomy performed to maximize this patient's visual acuity  Posterior capsular opacification, left Minor OS at this time no impact on acuity      ICD-10-CM   1. Vitreomacular adhesion of both eyes  H43.823 Color Fundus Photography Optos - OU - Both Eyes    OCT, Retina - OU - Both Eyes  2. Moderate nonproliferative diabetic retinopathy of both eyes without macular edema associated with type 2 diabetes mellitus (HCC)  N82.9562 Color Fundus Photography Optos - OU - Both Eyes    OCT, Retina - OU - Both Eyes  3. Posterior capsular opacification, right eye  H26.491   4. Posterior capsular opacification, left  H26.492     1.  Moderate nonproliferative diabetic retinopathy in each eye.  Does not require intervention at this time simple monitoring is appropriate.  He may have had a small extra macular distal branch retinal vein occlusion with nondiabetic proliferative retinopathy and temporal periphery of the right eye, there is however now no current actively perfused neovascular tissue, and with no tractional change upon the retina, observation alone would be appropriate.  2.  Minor vitreomacular adhesion is noted right eye with no impact on central macular region, observe ,, associated vitreous vessels are noted.  3.  OS physiologic vitreomacular adhesion no impact on acuity  4.  Visually significant posterior capsule opacification is present in the right eye only.  I do suggest YAG capsulotomy be performed. Okay  to proceed with YAG capsulotomy right eye and left eye.  Ophthalmic Meds Ordered this visit:  No orders of the defined types were placed in this encounter.      Return in about 6 months (around 10/04/2021) for DILATE OU, COLOR FP, OCT.  There are no Patient Instructions on file for this visit.   Explained the diagnoses, plan, and follow up with  the patient and they expressed understanding.  Patient expressed understanding of the importance of proper follow up care.   Alford Highland Elysabeth Aust M.D. Diseases & Surgery of the Retina and Vitreous [facility] 04/03/21     Abbreviations: M myopia (nearsighted); A astigmatism; H hyperopia (farsighted); P presbyopia; Mrx spectacle prescription;  CTL contact lenses; OD right eye; OS left eye; OU both eyes  XT exotropia; ET esotropia; PEK punctate epithelial keratitis; PEE punctate epithelial erosions; DES dry eye syndrome; MGD meibomian gland dysfunction; ATs artificial tears; PFAT's preservative free artificial tears; NSC nuclear sclerotic cataract; PSC posterior subcapsular cataract; ERM epi-retinal membrane; PVD posterior vitreous detachment; RD retinal detachment; DM diabetes mellitus; DR diabetic retinopathy; NPDR non-proliferative diabetic retinopathy; PDR proliferative diabetic retinopathy; CSME clinically significant macular edema; DME diabetic macular edema; dbh dot blot hemorrhages; CWS cotton wool spot; POAG primary open angle glaucoma; C/D cup-to-disc ratio; HVF humphrey visual field; GVF goldmann visual field; OCT optical coherence tomography; IOP intraocular pressure; BRVO Branch retinal vein occlusion; CRVO central retinal vein occlusion; CRAO central retinal artery occlusion; BRAO branch retinal artery occlusion; RT retinal tear; SB scleral buckle; PPV pars plana vitrectomy; VH Vitreous hemorrhage; PRP panretinal laser photocoagulation; IVK intravitreal kenalog; VMT vitreomacular traction; MH Macular hole;  NVD neovascularization of the disc; NVE neovascularization elsewhere; AREDS age related eye disease study; ARMD age related macular degeneration; POAG primary open angle glaucoma; EBMD epithelial/anterior basement membrane dystrophy; ACIOL anterior chamber intraocular lens; IOL intraocular lens; PCIOL posterior chamber intraocular lens; Phaco/IOL phacoemulsification with  intraocular lens placement; PRK photorefractive keratectomy; LASIK laser assisted in situ keratomileusis; HTN hypertension; DM diabetes mellitus; COPD chronic obstructive pulmonary disease

## 2021-04-03 NOTE — Assessment & Plan Note (Signed)
Very dense posterior capsule opacification.  I do recommend YAG capsulotomy performed to maximize this patient's visual acuity

## 2021-04-06 ENCOUNTER — Ambulatory Visit: Payer: Medicare HMO | Admitting: Cardiovascular Disease

## 2021-04-06 ENCOUNTER — Other Ambulatory Visit: Payer: Self-pay

## 2021-04-06 ENCOUNTER — Encounter: Payer: Self-pay | Admitting: Cardiovascular Disease

## 2021-04-06 VITALS — BP 140/78 | HR 83 | Ht 64.0 in | Wt 254.4 lb

## 2021-04-06 DIAGNOSIS — I480 Paroxysmal atrial fibrillation: Secondary | ICD-10-CM | POA: Diagnosis not present

## 2021-04-06 DIAGNOSIS — E782 Mixed hyperlipidemia: Secondary | ICD-10-CM | POA: Diagnosis not present

## 2021-04-06 DIAGNOSIS — G4733 Obstructive sleep apnea (adult) (pediatric): Secondary | ICD-10-CM

## 2021-04-06 DIAGNOSIS — I10 Essential (primary) hypertension: Secondary | ICD-10-CM | POA: Diagnosis not present

## 2021-04-06 NOTE — Assessment & Plan Note (Signed)
History of hyperlipidemia on statin therapy with lipid profile performed 04/03/2020 revealing total cholesterol 158, LDL of 94 and HDL 47.

## 2021-04-06 NOTE — Patient Instructions (Signed)
Medication Instructions:  No Changes In Medications at this time.  *If you need a refill on your cardiac medications before your next appointment, please call your pharmacy*  Follow-Up: At [facility], you and your health needs are our priority.  As part of our continuing mission to provide you with exceptional heart care, we have created designated Provider Care Teams.  These Care Teams include your primary Cardiologist (physician) and Advanced Practice Providers (APPs -  Physician Assistants and Nurse Practitioners) who all work together to provide you with the care you need, when you need it.  Your next appointment:   1 year(s)  The format for your next appointment:   In Person  Provider:   Jonathan Berry, MD  

## 2021-04-06 NOTE — Assessment & Plan Note (Signed)
History of obstructive sleep apnea on CPAP. 

## 2021-04-06 NOTE — Assessment & Plan Note (Signed)
History of essential hypertension a blood pressure measured today 140/78.  He is on amlodipine and losartan as well as carvedilol.

## 2021-04-06 NOTE — Progress Notes (Signed)
04/06/2021 Raymond Gordon   1951-04-08  [id number]  Primary Physician [name], Adelfa Koh, MD Primary Cardiologist: Runell Gess MD FACP, [place], [place], [place]  HPI:  Raymond Gordon is a 70 y.o.  severely overweight, single African American male with no children who I last  saw in the office 03/24/2020. 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 His risk factors including noninsulin-requiring diabetes, hypertension, hyperlipidemia and remote tobacco abuse having quit smoking back in 1999 and had smoked 1 pack per day. His father did have an MI and had bypass surgery. He was admitted to [facility] July 17, 2011, with chest pain and A-fib. He was catheterized by Dr. Italy Hilty on July 18, 2011, revealing minimal luminal irregularities with small vessel disease and normal LV function. He ultimately spontaneously converted to sinus rhythm and was placed on Pradaxa because of a CHADS2 score of 2. He has had no recurrent symptoms. He denies chest pain or shortness of breath.Dr. Wylene Simmer follows his lipid profile. He does give symptoms compatible with obstructive sleep apnea and currently wears CPAP with benefit. He was admitted to [facility] 07/08/15 with A. Fib with RVR and chest pain. His enzymes were negative. He is placed in IV due to diltiazem and converted spontaneously to sinus rhythm. His amlodipine was changed to by mouth diltiazem. He has had no recurrent symptoms. Since I saw him in the office back in August of last year he was admitted to [facility] on 08/13/15 through 08/22/15 with diastolic heart failure. He was diuresed 21 L and was educated about a low-salt diet.  Since I  saw him in the office a year ago he continues to do well.  He remains on Pradaxa for PAF although he said no recurrent episodes.  He denies chest pain or shortness of breath.  He continues to wear his CPAP.    Current Meds  Medication Sig  . allopurinol  (ZYLOPRIM) 300 MG tablet Take 300 mg by mouth daily.   Marland Kitchen amLODipine (NORVASC) 10 MG tablet Take 1 tablet by mouth once daily  . aspirin EC 81 MG tablet Take 81 mg by mouth daily.  Marland Kitchen atorvastatin (LIPITOR) 80 MG tablet Take 1 tablet by mouth once daily  . carvedilol (COREG) 25 MG tablet TAKE ONE & ONE-HALF TABLETS BY MOUTH TWICE DAILY  . doxazosin (CARDURA) 4 MG tablet Take 4 mg by mouth daily.   Marland Kitchen ELIQUIS 5 MG TABS tablet Take 1 tablet by mouth twice daily  . FARXIGA 10 MG TABS tablet   . furosemide (LASIX) 80 MG tablet Take 1 tablet (80 mg total) by mouth 2 (two) times daily.  Marland Kitchen HUMULIN R U-500 KWIKPEN 500 UNIT/ML kwikpen INJECT 80 UNITS SUBCUTANEOUSLY WITH BREAKFAST AND 60 WITH LUNCH AND 80 WITH SUPPER  . insulin aspart (NOVOLOG) 100 UNIT/ML injection Inject 30-60 Units into the skin 3 (three) times daily before meals.  . isosorbide mononitrate (IMDUR) 30 MG 24 hr tablet Take 1 tablet (30 mg total) by mouth daily.  . Lancets (ONETOUCH DELICA PLUS LANCET33G) MISC USE TO SELF MONITOR BLOOD GLUCOSE 4 TIMES DAILY  . losartan (COZAAR) 100 MG tablet Take 100 mg by mouth daily.  . Multiple Vitamin (MULTIVITAMIN WITH MINERALS) TABS tablet Take 1 tablet by mouth daily.  Marland Kitchen omeprazole (PRILOSEC OTC) 20 MG tablet Take 1 tablet (20 mg total) by mouth daily.  Marland Kitchen omeprazole (PRILOSEC) 20 MG capsule  Take 20 mg by mouth daily.  Letta Pate VERIO test strip USE 1 STRIP TO CHECK GLUCOSE 4 TIMES DAILY  . PRESCRIPTION MEDICATION Inhale into the lungs at bedtime. Pt is on C Pap machine  . Sulfamethoxazole-Trimethoprim (SULFAMETHOXAZOLE-TMP DS PO) Take by mouth 2 (two) times daily.  . tamsulosin (FLOMAX) 0.4 MG CAPS capsule Take 0.4 mg by mouth daily after supper.  . valsartan (DIOVAN) 160 MG tablet Take 1 tablet (160 mg total) by mouth daily.     No Known Allergies  Social History   Socioeconomic History  . Marital status: Single    Spouse name: Not on file  . Number of children: Not on file  . Years of  education: Not on file  . Highest education level: Not on file  Occupational History  . Not on file  Tobacco Use  . Smoking status: Former Smoker    Quit date: 11/25/1997    Years since quitting: 23.3  . Smokeless tobacco: Former Neurosurgeon    Quit date: 06/14/2015  Substance and Sexual Activity  . Alcohol use: No    Alcohol/week: 0.0 standard drinks  . Drug use: No  . Sexual activity: Not on file  Other Topics Concern  . Not on file  Social History Narrative  . Not on file   Social Determinants of Health   Financial Resource Strain: Not on file  Food Insecurity: Not on file  Transportation Needs: Not on file  Physical Activity: Not on file  Stress: Not on file  Social Connections: Not on file  Intimate Partner Violence: Not on file     Review of Systems: General: negative for chills, fever, night sweats or weight changes.  Cardiovascular: negative for chest pain, dyspnea on exertion, edema, orthopnea, palpitations, paroxysmal nocturnal dyspnea or shortness of breath Dermatological: negative for rash Respiratory: negative for cough or wheezing Urologic: negative for hematuria Abdominal: negative for nausea, vomiting, diarrhea, bright red blood per rectum, melena, or hematemesis Neurologic: negative for visual changes, syncope, or dizziness All other systems reviewed and are otherwise negative except as noted above.    Blood pressure 140/78, pulse 83, height 5\' 4"  (1.626 m), weight 254 lb 6.4 oz (115.4 kg), SpO2 94 %.  General appearance: alert and no distress Neck: no adenopathy, no carotid bruit, no JVD, supple, symmetrical, trachea midline and thyroid not enlarged, symmetric, no tenderness/mass/nodules Lungs: clear to auscultation bilaterally Heart: regular rate and rhythm, S1, S2 normal, no murmur, click, rub or gallop Extremities: extremities normal, atraumatic, no cyanosis or edema Pulses: 2+ and symmetric Skin: Skin color, texture, turgor normal. No rashes or  lesions Neurologic: Alert and oriented X 3, normal strength and tone. Normal symmetric reflexes. Normal coordination and gait  EKG sinus rhythm at 83 without ST or T wave changes.  I personally reviewed this EKG.  ASSESSMENT AND PLAN:   Paroxysmal atrial fibrillation History of paroxysmal A. fib maintaining sinus rhythm on Pradaxa oral anticoagulation.  Essential hypertension History of essential hypertension a blood pressure measured today 140/78.  He is on amlodipine and losartan as well as carvedilol.  HLD (hyperlipidemia) History of hyperlipidemia on statin therapy with lipid profile performed 04/03/2020 revealing total cholesterol 158, LDL of 94 and HDL 47.  Severe OSA (obstructive sleep apnea) History of obstructive sleep apnea on CPAP.      06/03/2020 MD FACP,FACC,FAHA, [facility] 04/06/2021 3:24 PM

## 2021-04-06 NOTE — Assessment & Plan Note (Signed)
History of paroxysmal A. fib maintaining sinus rhythm on Pradaxa oral anticoagulation.

## 2021-04-09 DIAGNOSIS — Z125 Encounter for screening for malignant neoplasm of prostate: Secondary | ICD-10-CM | POA: Diagnosis not present

## 2021-04-09 DIAGNOSIS — E1165 Type 2 diabetes mellitus with hyperglycemia: Secondary | ICD-10-CM | POA: Diagnosis not present

## 2021-04-09 DIAGNOSIS — E7800 Pure hypercholesterolemia, unspecified: Secondary | ICD-10-CM | POA: Diagnosis not present

## 2021-04-09 DIAGNOSIS — R972 Elevated prostate specific antigen [PSA]: Secondary | ICD-10-CM | POA: Diagnosis not present

## 2021-04-11 ENCOUNTER — Other Ambulatory Visit: Payer: Self-pay

## 2021-04-11 ENCOUNTER — Ambulatory Visit: Payer: Medicare HMO | Admitting: Podiatry

## 2021-04-11 DIAGNOSIS — I890 Lymphedema, not elsewhere classified: Secondary | ICD-10-CM

## 2021-04-11 DIAGNOSIS — B351 Tinea unguium: Secondary | ICD-10-CM | POA: Diagnosis not present

## 2021-04-11 DIAGNOSIS — E1151 Type 2 diabetes mellitus with diabetic peripheral angiopathy without gangrene: Secondary | ICD-10-CM

## 2021-04-11 DIAGNOSIS — E1165 Type 2 diabetes mellitus with hyperglycemia: Secondary | ICD-10-CM

## 2021-04-11 DIAGNOSIS — M79675 Pain in left toe(s): Secondary | ICD-10-CM

## 2021-04-11 DIAGNOSIS — Z6841 Body Mass Index (BMI) 40.0 and over, adult: Secondary | ICD-10-CM | POA: Insufficient documentation

## 2021-04-11 DIAGNOSIS — M79674 Pain in right toe(s): Secondary | ICD-10-CM | POA: Diagnosis not present

## 2021-04-11 HISTORY — DX: Type 2 diabetes mellitus with hyperglycemia: E11.65

## 2021-04-12 ENCOUNTER — Encounter: Payer: Self-pay | Admitting: Podiatry

## 2021-04-12 DIAGNOSIS — M79674 Pain in right toe(s): Secondary | ICD-10-CM | POA: Insufficient documentation

## 2021-04-12 DIAGNOSIS — B351 Tinea unguium: Secondary | ICD-10-CM | POA: Insufficient documentation

## 2021-04-12 DIAGNOSIS — I890 Lymphedema, not elsewhere classified: Secondary | ICD-10-CM | POA: Insufficient documentation

## 2021-04-12 NOTE — Progress Notes (Signed)
  Subjective:  Patient ID: Raymond Gordon, male    DOB: 1951-04-29,  MRN: [id number]  70 y.o. male presents with at risk foot care. Pt has h/o NIDDM with PAD and painful thick toenails that are difficult to trim. Pain interferes with ambulation. Aggravating factors include wearing enclosed shoe gear. Pain is relieved with periodic professional debridement..    Patient's blood sugar was 164 mg/dl this morning.  He is accompanied by his niece on today's visit. She states her uncle is quiet and does not complain about anything. Niece states she was unaware of the state of his toenails and feet. As soon as she saw them, she scheduled an appointment for him.   PCP: Gaspar Garbe, MD and last visit was: 01/31/2021.  Review of Systems: Negative except as noted in the HPI.   No Known Allergies  Objective:  There were no vitals filed for this visit. Constitutional Patient is a pleasant 70 y.o. African American male morbidly obese in NAD. AAO x 3.  Vascular Capillary refill time to digits <4 seconds b/l lower extremities. Nonpalpable DP pulse(s) b/l lower extremities. Nonpalpable PT pulse(s) b/l lower extremities. Pedal hair present. Lower extremity skin temperature gradient within normal limits. No pain with calf compression b/l. Lymphedema present b/l lower extremities. No cyanosis or clubbing noted.  Neurologic Normal speech. Protective sensation intact 5/5 intact bilaterally with 10g monofilament b/l.  Dermatologic Pedal skin with normal turgor, texture and tone bilaterally. No open wounds bilaterally. No interdigital macerations bilaterally. Toenails 1-5 b/l elongated, discolored, dystrophic, thickened, crumbly with subungual debris and tenderness to dorsal palpation. Exuberant amount of thick fungal cobblestonel plaques noted on anterior aspects of both legs and dorsum of both feet extending to toes. +Odor. No drainage.   Orthopedic: Normal muscle strength 5/5 to all lower extremity muscle groups  bilaterally. No pain crepitus or joint limitation noted with ROM b/l. No gross bony deformities bilaterally.   Assessment:   1. Pain due to onychomycosis of toenails of both feet   2. Lymphedema   3. Elephantiasis nostra verrucosa   4. Type II diabetes mellitus with peripheral circulatory disorder [facility])    Plan:  Patient was evaluated and treated and all questions answered.  Onychomycosis with pain -Nails palliatively debridement as below. -Educated on self-care  Procedure: Nail Debridement Rationale: Pain Type of Debridement: manual, sharp debridement. Instrumentation: Nail nipper, rotary burr. Number of Nails: 10  -Examined patient. -Patient to continue soft, supportive shoe gear daily. -Toenails 1-5 b/l were debrided in length and girth with sterile nail nippers and dremel without iatrogenic bleeding.  -Patient to report any pedal injuries to medical professional immediately. -Mr. Burgo will see his PCP on next week. I have discussed with his niece he may need  whirlpool therapy initially to get these plaques off. I don't think a topical medication will be effective for the amount of plaques and thickness of plaques. She will contact me via MyChart and inform me of the plan once he sees his PCP. -Patient/POA to call should there be question/concern in the interim.  Return in about 3 months (around 07/12/2021).  [name], DPM

## 2021-04-16 DIAGNOSIS — E1165 Type 2 diabetes mellitus with hyperglycemia: Secondary | ICD-10-CM | POA: Diagnosis not present

## 2021-04-16 DIAGNOSIS — Z0000 Encounter for general adult medical examination without abnormal findings: Secondary | ICD-10-CM | POA: Diagnosis not present

## 2021-04-16 DIAGNOSIS — I5030 Unspecified diastolic (congestive) heart failure: Secondary | ICD-10-CM | POA: Diagnosis not present

## 2021-04-16 DIAGNOSIS — E1151 Type 2 diabetes mellitus with diabetic peripheral angiopathy without gangrene: Secondary | ICD-10-CM | POA: Diagnosis not present

## 2021-04-16 DIAGNOSIS — L850 Acquired ichthyosis: Secondary | ICD-10-CM | POA: Diagnosis not present

## 2021-04-16 DIAGNOSIS — I110 Hypertensive heart disease with heart failure: Secondary | ICD-10-CM | POA: Diagnosis not present

## 2021-04-16 DIAGNOSIS — E7800 Pure hypercholesterolemia, unspecified: Secondary | ICD-10-CM | POA: Diagnosis not present

## 2021-04-16 DIAGNOSIS — Z794 Long term (current) use of insulin: Secondary | ICD-10-CM | POA: Diagnosis not present

## 2021-04-18 ENCOUNTER — Encounter: Payer: Self-pay | Admitting: Podiatry

## 2021-04-19 ENCOUNTER — Encounter (INDEPENDENT_AMBULATORY_CARE_PROVIDER_SITE_OTHER): Payer: Self-pay

## 2021-04-19 DIAGNOSIS — H2513 Age-related nuclear cataract, bilateral: Secondary | ICD-10-CM | POA: Diagnosis not present

## 2021-04-19 DIAGNOSIS — H26491 Other secondary cataract, right eye: Secondary | ICD-10-CM | POA: Diagnosis not present

## 2021-04-21 ENCOUNTER — Other Ambulatory Visit: Payer: Self-pay | Admitting: Cardiovascular Disease

## 2021-04-21 DIAGNOSIS — I4891 Unspecified atrial fibrillation: Secondary | ICD-10-CM

## 2021-04-24 NOTE — Telephone Encounter (Signed)
68m, 115.4kg, Creatinine, Serum 0.900 mg/ 04/03/2020, lovw/ berry 04/06/21. Refill sent for 1 month and note on instructions labs needed for further refills since pt is overdue

## 2021-05-02 DIAGNOSIS — H26492 Other secondary cataract, left eye: Secondary | ICD-10-CM | POA: Diagnosis not present

## 2021-05-03 ENCOUNTER — Encounter (INDEPENDENT_AMBULATORY_CARE_PROVIDER_SITE_OTHER): Payer: Self-pay

## 2021-05-03 DIAGNOSIS — H26492 Other secondary cataract, left eye: Secondary | ICD-10-CM | POA: Diagnosis not present

## 2021-05-12 ENCOUNTER — Other Ambulatory Visit: Payer: Self-pay | Admitting: Cardiovascular Disease

## 2021-05-14 DIAGNOSIS — E1165 Type 2 diabetes mellitus with hyperglycemia: Secondary | ICD-10-CM | POA: Diagnosis not present

## 2021-05-14 DIAGNOSIS — E1151 Type 2 diabetes mellitus with diabetic peripheral angiopathy without gangrene: Secondary | ICD-10-CM | POA: Diagnosis not present

## 2021-05-14 DIAGNOSIS — L850 Acquired ichthyosis: Secondary | ICD-10-CM | POA: Diagnosis not present

## 2021-06-16 ENCOUNTER — Other Ambulatory Visit: Payer: Self-pay | Admitting: Cardiovascular Disease

## 2021-06-19 NOTE — Telephone Encounter (Signed)
Prescription refill request for Eliquis received. Indication:afib Last office visit:berry 04/06/21 Scr:0.900 mg/ 04/03/2020 (overdue) Age: 44m [id number]:155.2CE Refill granted for one month with indications on bottle to complete labs

## 2021-07-16 ENCOUNTER — Encounter: Payer: Self-pay | Admitting: Podiatry

## 2021-07-16 ENCOUNTER — Other Ambulatory Visit: Payer: Self-pay

## 2021-07-16 ENCOUNTER — Ambulatory Visit (INDEPENDENT_AMBULATORY_CARE_PROVIDER_SITE_OTHER): Payer: Medicare HMO | Admitting: Podiatry

## 2021-07-16 DIAGNOSIS — B351 Tinea unguium: Secondary | ICD-10-CM | POA: Diagnosis not present

## 2021-07-16 DIAGNOSIS — M79675 Pain in left toe(s): Secondary | ICD-10-CM

## 2021-07-16 DIAGNOSIS — L850 Acquired ichthyosis: Secondary | ICD-10-CM | POA: Insufficient documentation

## 2021-07-16 DIAGNOSIS — M79674 Pain in right toe(s): Secondary | ICD-10-CM | POA: Diagnosis not present

## 2021-07-16 DIAGNOSIS — E1151 Type 2 diabetes mellitus with diabetic peripheral angiopathy without gangrene: Secondary | ICD-10-CM

## 2021-07-18 DIAGNOSIS — L850 Acquired ichthyosis: Secondary | ICD-10-CM | POA: Diagnosis not present

## 2021-07-18 DIAGNOSIS — I110 Hypertensive heart disease with heart failure: Secondary | ICD-10-CM | POA: Diagnosis not present

## 2021-07-18 DIAGNOSIS — E113393 Type 2 diabetes mellitus with moderate nonproliferative diabetic retinopathy without macular edema, bilateral: Secondary | ICD-10-CM | POA: Diagnosis not present

## 2021-07-18 DIAGNOSIS — E7800 Pure hypercholesterolemia, unspecified: Secondary | ICD-10-CM | POA: Diagnosis not present

## 2021-07-18 DIAGNOSIS — I480 Paroxysmal atrial fibrillation: Secondary | ICD-10-CM | POA: Diagnosis not present

## 2021-07-18 DIAGNOSIS — E1151 Type 2 diabetes mellitus with diabetic peripheral angiopathy without gangrene: Secondary | ICD-10-CM | POA: Diagnosis not present

## 2021-07-18 DIAGNOSIS — I5030 Unspecified diastolic (congestive) heart failure: Secondary | ICD-10-CM | POA: Diagnosis not present

## 2021-07-18 DIAGNOSIS — E1165 Type 2 diabetes mellitus with hyperglycemia: Secondary | ICD-10-CM | POA: Diagnosis not present

## 2021-07-21 NOTE — Progress Notes (Signed)
Subjective: Raymond Gordon is a pleasant 70 y.o. male patient seen today for at risk foot care. Pt has h/o NIDDM with PAD and thick, elongated toenails b/l feet which are tender when wearing enclosed shoe gear.   Patient states their blood glucose was 201 mg/dl today.  His sister is present during today's visit.  PCP is [name], Adelfa Koh, MD. Last visit was: 05/14/2021.  No Known Allergies  Objective: Physical Exam  General: Raymond Gordon is a pleasant 70 y.o. African American male, morbidly obese in NAD. AAO x 3.   Vascular:  Capillary refill time to digits <4 seconds b/l lower extremities. Nonpalpable DP pulse(s) b/l lower extremities. Nonpalpable PT pulse(s) b/l lower extremities. Pedal hair absent. Lower extremity skin temperature gradient within normal limits. Lymphedema present b/l lower extremities.  Dermatological:  No open wounds b/l lower extremities. No interdigital macerations b/l lower extremities. Toenails 1-5 b/l elongated, discolored, dystrophic, thickened, crumbly with subungual debris and tenderness to dorsal palpation. Significant improvement in skin appearance of both legs.  Musculoskeletal:  Normal muscle strength 5/5 to all lower extremity muscle groups bilaterally. No pain crepitus or joint limitation noted with ROM b/l lower extremities. No gross bony deformities b/l lower extremities.  Neurological:  Protective sensation intact 5/5 intact bilaterally with 10g monofilament b/l.  Assessment and Plan:  1. Pain due to onychomycosis of toenails of both feet   2. Type II diabetes mellitus with peripheral circulatory disorder (HCC)    -Examined patient. -His legs have significantly improved since last visit. Continue regimen prescribed by Dr. Wylene Simmer. -Continue diabetic foot care principles: inspect feet daily, monitor glucose as recommended by PCP and/or Endocrinologist, and follow prescribed diet per PCP, Endocrinologist and/or dietician. -Patient to continue  soft, supportive shoe gear daily. -Toenails 1-5 b/l were debrided in length and girth with sterile nail nippers and dremel without iatrogenic bleeding.  -Patient to report any pedal injuries to medical professional immediately. -Patient/POA to call should there be question/concern in the interim.  Return in about 3 months (around 10/16/2021).  [name], DPM

## 2021-08-04 ENCOUNTER — Other Ambulatory Visit: Payer: Self-pay | Admitting: Cardiovascular Disease

## 2021-08-06 NOTE — Telephone Encounter (Signed)
Prescription refill request for Eliquis received. Indication:AFIB Last office visit:BERRY 04/06/21 Scr:0.900 mg/ 04/03/2020 OUTDATED Age: 4M Weight:115.4KG

## 2021-09-08 ENCOUNTER — Other Ambulatory Visit: Payer: Self-pay | Admitting: Cardiovascular Disease

## 2021-09-17 ENCOUNTER — Other Ambulatory Visit: Payer: Self-pay | Admitting: Cardiovascular Disease

## 2021-09-18 NOTE — Telephone Encounter (Signed)
Prescription refill request for Eliquis received. Indication: afib Last office visit:berry 04/06/21 Scr:0.900 mg/ 04/03/2020 Age: 60m [id number]:[id number]

## 2021-10-02 DIAGNOSIS — N401 Enlarged prostate with lower urinary tract symptoms: Secondary | ICD-10-CM | POA: Diagnosis not present

## 2021-10-04 ENCOUNTER — Encounter (INDEPENDENT_AMBULATORY_CARE_PROVIDER_SITE_OTHER): Payer: Medicare HMO | Admitting: Ophthalmology

## 2021-10-12 DIAGNOSIS — N401 Enlarged prostate with lower urinary tract symptoms: Secondary | ICD-10-CM | POA: Diagnosis not present

## 2021-10-12 DIAGNOSIS — R350 Frequency of micturition: Secondary | ICD-10-CM | POA: Diagnosis not present

## 2021-10-12 DIAGNOSIS — R972 Elevated prostate specific antigen [PSA]: Secondary | ICD-10-CM | POA: Diagnosis not present

## 2021-10-17 ENCOUNTER — Other Ambulatory Visit: Payer: Self-pay | Admitting: Nurse Practitioner

## 2021-10-17 DIAGNOSIS — R972 Elevated prostate specific antigen [PSA]: Secondary | ICD-10-CM

## 2021-10-20 ENCOUNTER — Other Ambulatory Visit: Payer: Self-pay | Admitting: Cardiovascular Disease

## 2021-10-22 NOTE — Telephone Encounter (Signed)
Prescription refill request for Eliquis received. Indication:Afib Last office visit:5/22 ALP:FXTKW Labs Age: 71 Weight:115.4 kg  Prescription refilled

## 2021-10-23 ENCOUNTER — Other Ambulatory Visit: Payer: Self-pay

## 2021-10-23 ENCOUNTER — Ambulatory Visit (INDEPENDENT_AMBULATORY_CARE_PROVIDER_SITE_OTHER): Payer: Medicare HMO | Admitting: Podiatry

## 2021-10-23 DIAGNOSIS — M79675 Pain in left toe(s): Secondary | ICD-10-CM

## 2021-10-23 DIAGNOSIS — E1151 Type 2 diabetes mellitus with diabetic peripheral angiopathy without gangrene: Secondary | ICD-10-CM

## 2021-10-23 DIAGNOSIS — B351 Tinea unguium: Secondary | ICD-10-CM | POA: Diagnosis not present

## 2021-10-23 DIAGNOSIS — M79674 Pain in right toe(s): Secondary | ICD-10-CM | POA: Diagnosis not present

## 2021-10-24 ENCOUNTER — Encounter: Payer: Self-pay | Admitting: Podiatry

## 2021-10-24 DIAGNOSIS — Z23 Encounter for immunization: Secondary | ICD-10-CM | POA: Diagnosis not present

## 2021-10-24 DIAGNOSIS — E113393 Type 2 diabetes mellitus with moderate nonproliferative diabetic retinopathy without macular edema, bilateral: Secondary | ICD-10-CM | POA: Diagnosis not present

## 2021-10-24 DIAGNOSIS — I209 Angina pectoris, unspecified: Secondary | ICD-10-CM | POA: Diagnosis not present

## 2021-10-24 DIAGNOSIS — E7800 Pure hypercholesterolemia, unspecified: Secondary | ICD-10-CM | POA: Diagnosis not present

## 2021-10-24 DIAGNOSIS — I480 Paroxysmal atrial fibrillation: Secondary | ICD-10-CM | POA: Diagnosis not present

## 2021-10-24 DIAGNOSIS — M109 Gout, unspecified: Secondary | ICD-10-CM | POA: Diagnosis not present

## 2021-10-24 DIAGNOSIS — D6869 Other thrombophilia: Secondary | ICD-10-CM | POA: Diagnosis not present

## 2021-10-24 DIAGNOSIS — L850 Acquired ichthyosis: Secondary | ICD-10-CM | POA: Diagnosis not present

## 2021-10-24 DIAGNOSIS — E1165 Type 2 diabetes mellitus with hyperglycemia: Secondary | ICD-10-CM | POA: Diagnosis not present

## 2021-10-24 NOTE — Progress Notes (Signed)
  Subjective:  Patient ID: Raymond Gordon, male    DOB: 1951/06/21,  MRN: [id number]  Raymond Gordon presents to clinic today for at risk foot care. Pt has h/o NIDDM with PAD and painful elongated mycotic toenails 1-5 bilaterally which are tender when wearing enclosed shoe gear. Pain is relieved with periodic professional debridement.  Patient states blood glucose was 210 mg/dl today.    Raymond Gordon is accompanied by his sister on today's visit. Sister states he has not been applying cream to his legs/feet as instructed.   Raymond Gordon voices no new pedal concerns on today's visit.  PCP is [name], Adelfa Koh, MD , and last visit was 07/18/2021.  No Known Allergies  Review of Systems: Negative except as noted in the HPI. Objective:   Constitutional Raymond Gordon is a pleasant 70 y.o. African American male, morbidly obese in NAD. AAO x 3.   Vascular CFT <4 seconds b/l LE. Nonpalpable DP/PT pulses b/l. Pedal hair absent b/l. Skin temperature gradient warm to cool b/l. No pain with calf compression b/l. No cyanosis or clubbing noted b/l LE. Lymphedema present BLE.  Neurologic Normal speech. Oriented to person, place, and time. Protective sensation intact 5/5 intact bilaterally with 10g monofilament b/l.  Dermatologic No open wounds b/l LE. No interdigital macerations noted b/l LE. Toenails 1-5 b/l elongated, discolored, dystrophic, thickened, crumbly with subungual debris and tenderness to dorsal palpation. No hyperkeratotic nor porokeratotic lesions present on today's visit. Skin b/l lower extremities noted to be thickened and brawny consistent with lymphedema.  Orthopedic: Muscle strength 5/5 to all lower extremity muscle groups bilaterally. No pain, crepitus or joint limitation noted with ROM bilateral LE. Pes planus deformity noted bilateral LE. Utilizes cane for ambulation assistance.   Radiographs: None   Assessment:   1. Pain due to onychomycosis of toenails of both feet   2. Type II diabetes  mellitus with peripheral circulatory disorder [facility])    Plan:  Patient was evaluated and treated and all questions answered. Consent given for treatment as described below: -Patient encouraged to apply cream to feet as instructed. He related understanding. -Continue foot and shoe inspections daily. Monitor blood glucose per PCP/Endocrinologist's recommendations. -Mycotic toenails 1-5 bilaterally were debrided in length and girth with sterile nail nippers and dremel without incident. -Patient/POA to call should there be question/concern in the interim.  Return in about 3 months (around 01/22/2022).  [name], DPM

## 2021-10-30 ENCOUNTER — Other Ambulatory Visit: Payer: Self-pay | Admitting: Cardiovascular Disease

## 2021-10-30 NOTE — Telephone Encounter (Signed)
Prescription refill request for Eliquis received. Indication: Afib  Last office visit: 04/06/21 Allyson Sabal)  Scr: 0.9 (04/03/20)  Age: 70 Weight: 115.4kg  Lab overdue.Labs ordered on 04/24/21 by Eather Colas; however, were never completed. Called pt, no answer. LMOM

## 2021-10-31 ENCOUNTER — Telehealth: Payer: Self-pay

## 2021-10-31 ENCOUNTER — Other Ambulatory Visit: Payer: Self-pay

## 2021-10-31 MED ORDER — APIXABAN 5 MG PO TABS: 5.0000 mg | ORAL_TABLET | Freq: Two times a day (BID) | ORAL | 0 refills | Status: DC

## 2021-10-31 NOTE — Telephone Encounter (Signed)
Lpm to come and get labs drawn

## 2021-10-31 NOTE — Telephone Encounter (Signed)
I spoke to the patient who will need his sister to bring him in for lab work, tomorrow or Friday.

## 2021-11-02 DIAGNOSIS — I4891 Unspecified atrial fibrillation: Secondary | ICD-10-CM | POA: Diagnosis not present

## 2021-11-02 LAB — COMPREHENSIVE METABOLIC PANEL
ALT: 13 IU/L (ref 0–44)
AST: 13 IU/L (ref 0–40)
Albumin/Globulin Ratio: 1.5 (ref 1.2–2.2)
Albumin: 4.5 g/dL (ref 3.7–4.7)
Alkaline Phosphatase: 98 IU/L (ref 44–121)
BUN/Creatinine Ratio: 17 (ref 10–24)
BUN: 17 mg/dL (ref 8–27)
Bilirubin Total: 0.3 mg/dL (ref 0.0–1.2)
CO2: 26 mmol/L (ref 20–29)
Calcium: 9.9 mg/dL (ref 8.6–10.2)
Chloride: 101 mmol/L (ref 96–106)
Creatinine, Ser: 1.03 mg/dL (ref 0.76–1.27)
Globulin, Total: 3.1 g/dL (ref 1.5–4.5)
Glucose: 173 mg/dL — ABNORMAL HIGH (ref 70–99)
Potassium: 3.5 mmol/L (ref 3.5–5.2)
Sodium: 141 mmol/L (ref 134–144)
Total Protein: 7.6 g/dL (ref 6.0–8.5)
eGFR: 78 mL/min/{1.73_m2} (ref >59)

## 2021-11-17 ENCOUNTER — Other Ambulatory Visit: Payer: Self-pay | Admitting: Cardiovascular Disease

## 2021-11-17 DIAGNOSIS — I480 Paroxysmal atrial fibrillation: Secondary | ICD-10-CM

## 2021-11-20 NOTE — Telephone Encounter (Signed)
Prescription refill request for Eliquis received. Indication: a fib Last office visit: 04/06/21 Scr: 1.03 Age: 70 Weight: 115kg

## 2021-11-29 ENCOUNTER — Ambulatory Visit
Admission: RE | Admit: 2021-11-29 | Discharge: 2021-11-29 | Disposition: A | Discharge status: Home / Self Care | Payer: Medicare HMO | Source: Ambulatory Visit | Attending: Nurse Practitioner | Admitting: Nurse Practitioner

## 2021-11-29 ENCOUNTER — Other Ambulatory Visit: Payer: Self-pay

## 2021-11-29 DIAGNOSIS — R972 Elevated prostate specific antigen [PSA]: Secondary | ICD-10-CM

## 2021-11-29 DIAGNOSIS — N400 Enlarged prostate without lower urinary tract symptoms: Secondary | ICD-10-CM | POA: Diagnosis not present

## 2021-11-29 DIAGNOSIS — R590 Localized enlarged lymph nodes: Secondary | ICD-10-CM | POA: Diagnosis not present

## 2021-11-29 MED ORDER — GADOBENATE DIMEGLUMINE 529 MG/ML IV SOLN
20.0000 mL | Freq: Once | INTRAVENOUS | Status: AC | PRN
  Administered 2021-11-29: 20 mL via INTRAVENOUS

## 2021-11-30 ENCOUNTER — Other Ambulatory Visit: Payer: Self-pay | Admitting: Cardiovascular Disease

## 2022-01-01 DIAGNOSIS — E1151 Type 2 diabetes mellitus with diabetic peripheral angiopathy without gangrene: Secondary | ICD-10-CM | POA: Diagnosis not present

## 2022-01-01 DIAGNOSIS — I110 Hypertensive heart disease with heart failure: Secondary | ICD-10-CM | POA: Diagnosis not present

## 2022-01-01 DIAGNOSIS — I5030 Unspecified diastolic (congestive) heart failure: Secondary | ICD-10-CM | POA: Diagnosis not present

## 2022-01-01 DIAGNOSIS — E113393 Type 2 diabetes mellitus with moderate nonproliferative diabetic retinopathy without macular edema, bilateral: Secondary | ICD-10-CM | POA: Diagnosis not present

## 2022-01-01 DIAGNOSIS — Z1331 Encounter for screening for depression: Secondary | ICD-10-CM | POA: Diagnosis not present

## 2022-01-01 DIAGNOSIS — Z1339 Encounter for screening examination for other mental health and behavioral disorders: Secondary | ICD-10-CM | POA: Diagnosis not present

## 2022-01-01 DIAGNOSIS — I480 Paroxysmal atrial fibrillation: Secondary | ICD-10-CM | POA: Diagnosis not present

## 2022-01-01 DIAGNOSIS — I209 Angina pectoris, unspecified: Secondary | ICD-10-CM | POA: Diagnosis not present

## 2022-01-01 DIAGNOSIS — Z794 Long term (current) use of insulin: Secondary | ICD-10-CM | POA: Diagnosis not present

## 2022-01-01 DIAGNOSIS — E1165 Type 2 diabetes mellitus with hyperglycemia: Secondary | ICD-10-CM | POA: Diagnosis not present

## 2022-01-01 DIAGNOSIS — L850 Acquired ichthyosis: Secondary | ICD-10-CM | POA: Diagnosis not present

## 2022-01-28 ENCOUNTER — Other Ambulatory Visit: Payer: Self-pay

## 2022-01-28 ENCOUNTER — Ambulatory Visit (INDEPENDENT_AMBULATORY_CARE_PROVIDER_SITE_OTHER): Payer: Medicare HMO | Admitting: Podiatry

## 2022-01-28 DIAGNOSIS — M79675 Pain in left toe(s): Secondary | ICD-10-CM

## 2022-01-28 DIAGNOSIS — M2141 Flat foot [pes planus] (acquired), right foot: Secondary | ICD-10-CM | POA: Diagnosis not present

## 2022-01-28 DIAGNOSIS — L853 Xerosis cutis: Secondary | ICD-10-CM | POA: Diagnosis not present

## 2022-01-28 DIAGNOSIS — M79674 Pain in right toe(s): Secondary | ICD-10-CM

## 2022-01-28 DIAGNOSIS — B351 Tinea unguium: Secondary | ICD-10-CM

## 2022-01-28 DIAGNOSIS — E1151 Type 2 diabetes mellitus with diabetic peripheral angiopathy without gangrene: Secondary | ICD-10-CM | POA: Diagnosis not present

## 2022-01-28 DIAGNOSIS — I890 Lymphedema, not elsewhere classified: Secondary | ICD-10-CM | POA: Diagnosis not present

## 2022-01-28 DIAGNOSIS — M2142 Flat foot [pes planus] (acquired), left foot: Secondary | ICD-10-CM | POA: Diagnosis not present

## 2022-01-28 DIAGNOSIS — E119 Type 2 diabetes mellitus without complications: Secondary | ICD-10-CM | POA: Diagnosis not present

## 2022-02-03 ENCOUNTER — Encounter: Payer: Self-pay | Admitting: Podiatry

## 2022-02-03 NOTE — Progress Notes (Signed)
ANNUAL DIABETIC FOOT EXAM  Subjective: Raymond Gordon presents today for annual diabetic foot examination.  Patient relates 71 year h/o diabetes.  Patient denies any h/o foot wounds.  Patient denies any numbness, tingling, burning, or pins/needle sensation in feet.  Patient's blood sugar was 240 mg/dl today.   Risk factors:  chronic lower extremity edema, uncontrolled diabetes, HTN, CHF, hyperlipidemia, h/o tobacco use in remission.  [name], Fransico Him, MD is patient's PCP. Last visit was January 01, 2022.  His sister states he has not been performing his foot hygiene and moisturizer as instructed. In the past, his sisters have done it for him with very good results.He has not been keeping up with the treatment plan.  Past Medical History:  Diagnosis Date   Bilateral lower extremity edema    Chronic diastolic CHF (congestive heart failure) ([place]) 08/15/2015   a. 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   Pain due to onychomycosis of toenails of both feet 04/12/2021   Body mass index (BMI) 40.0-44.9, adult ([place]) 04/11/2021   Hyperglycemia due to type 2 diabetes mellitus ([place]) 04/11/2021   Vitreomacular adhesion of both eyes 04/03/2021   Moderate nonproliferative diabetic retinopathy of both eyes without macular edema associated with type 2 diabetes mellitus ([place]) 04/03/2021   Posterior capsular opacification, right eye 04/03/2021   Posterior capsular opacification, left 04/03/2021   Thrombophilia ([place]) 04/10/2020   Chronic anticoagulation 12/19/2016   Encounter for general adult medical examination without abnormal findings 02/26/2016   Chronic diastolic heart failure ([place]) 09/13/2015   Angina pectoris ([place])  09/12/2015   Bacteriuria, asymptomatic [id number]   Acute diastolic CHF (congestive heart failure) ([place]) 08/13/2015   Acute respiratory failure with hypoxia ([place]) 08/13/2015   Uncontrolled diabetes mellitus 08/13/2015   Atrial fibrillation ([place]) 08/13/2015   Atrial fibrillation with RVR ([place]) 07/08/2015   Acute urinary obstruction 07/08/2015   Prolonged Q-T interval on ECG 07/08/2015   Severe OSA (obstructive sleep apnea) 06/15/2015   Bilateral lower extremity edema 06/15/2015   Edema 03/13/2015   Pulmonary edema 05/20/2014   Hypoglycemia 05/20/2014   Syncope and collapse 05/20/2014    Class: Acute   CHF (congestive heart failure), NYHA class I ([place]) 05/20/2014   Paroxysmal atrial fibrillation ([place]) 11/30/2013   Morbid obesity ([place]) 11/30/2013   Essential hypertension 11/30/2013   HLD (hyperlipidemia) 11/30/2013   Diabetes ([place]) 11/30/2013   Cardiomegaly 02/16/2013   Elevated PSA 02/16/2013   Gout 12/04/2009   Pure hypercholesterolemia 10/28/2009   Past Surgical History:  Procedure Laterality Date   [place] N/A 2012   no large vessel occlusions   Current Outpatient Medications on File Prior to Visit  Medication Sig Dispense Refill   allopurinol (ZYLOPRIM) 300 MG tablet Take 300 mg by mouth daily.      amLODipine (NORVASC) 10 MG tablet Take 1 tablet by mouth once daily 90 tablet 3   apixaban (ELIQUIS) 5 MG TABS tablet Take 1 tablet by mouth twice daily 180 tablet 0   aspirin EC 81 MG tablet Take 81 mg by mouth daily.     atorvastatin (LIPITOR) 80 MG tablet Take 1  tablet by mouth once daily 90 tablet 0   carvedilol (COREG) 25 MG tablet TAKE ONE & ONE-HALF TABLETS BY MOUTH TWICE DAILY 90 tablet 3   doxazosin (CARDURA) 4 MG tablet Take 4 mg by mouth daily.      FARXIGA 10 MG TABS tablet      furosemide (LASIX) 80 MG tablet Take 1 tablet (80 mg total) by mouth 2 (two) times daily. 60 tablet 0   HUMULIN R U-500 KWIKPEN 500 UNIT/ML  kwikpen INJECT 80 UNITS SUBCUTANEOUSLY WITH BREAKFAST AND 60 WITH LUNCH AND 80 WITH SUPPER     indomethacin (INDOCIN) 25 MG capsule one tablet PO BID PRN for gout     insulin aspart (NOVOLOG) 100 UNIT/ML injection Inject 30-60 Units into the skin 3 (three) times daily before meals.     Insulin Pen Needle (NOVOFINE) 30G X 8 MM MISC BID use for Flexped     isosorbide mononitrate (IMDUR) 30 MG 24 hr tablet Take 1 tablet (30 mg total) by mouth daily.     Lancets (ONETOUCH DELICA PLUS 123XX123) MISC use to self monitor blood glucose four times daily (E11.65)     losartan (COZAAR) 100 MG tablet Take 100 mg by mouth daily.     Multiple Vitamin (MULTIVITAMIN WITH MINERALS) TABS tablet Take 1 tablet by mouth daily.     omeprazole (PRILOSEC OTC) 20 MG tablet Take 1 tablet (20 mg total) by mouth daily.     omeprazole (PRILOSEC) 20 MG capsule Take 20 mg by mouth daily.     ONETOUCH VERIO test strip USE 1 STRIP TO CHECK GLUCOSE 4 TIMES DAILY     PFIZER-BIONT COVID-19 VAC-TRIS SUSP injection      PRESCRIPTION MEDICATION Inhale into the lungs at bedtime. Pt is on C Pap machine     Sulfamethoxazole-Trimethoprim (SULFAMETHOXAZOLE-TMP DS PO) Take by mouth 2 (two) times daily.     tamsulosin (FLOMAX) 0.4 MG CAPS capsule Take 0.4 mg by mouth daily after supper.     valsartan (DIOVAN) 160 MG tablet Take 1 tablet (160 mg total) by mouth daily. 90 tablet 3   Zoster Vaccine Adjuvanted [facility]) injection Inject series of 2 injections as directed     No current facility-administered medications on file prior to visit.    No Known Allergies Social History   Occupational History   Not on file  Tobacco Use   Smoking status: Former    Types: Cigarettes    Quit date: 11/25/1997    Years since quitting: 24.2   Smokeless tobacco: Former    Quit date: 06/14/2015  Substance and Sexual Activity   Alcohol use: No    Alcohol/week: 0.0 standard drinks   Drug use: No   Sexual activity: Not on file   Family History   Problem Relation Age of Onset   Heart failure Brother    Hyperlipidemia Brother    Hypertension Brother    Stroke Brother    Hypertension Mother    Hyperlipidemia Mother    Diabetes Mother    Cancer Father        prostate   GER disease Father    Stroke Father    Hypertension Sister    Hyperlipidemia Sister    Hypertension Maternal Grandmother    Stroke Maternal Grandmother    Diabetes Maternal Grandfather    Hypertension Maternal Grandfather    Immunization History  Administered Date(s) Administered   Influenza Split 08/24/2010, 08/08/2011, 08/25/2012, 10/12/2013, 08/26/2014   Influenza, High Dose Seasonal PF 10/03/2016, 10/01/2017  Influenza, Quadrivalent, Recombinant, Inj, Pf 08/20/2018   Influenza,inj,Quad PF,6+ Mos 08/26/2014, 07/27/2015   Influenza-Unspecified 08/11/2015   Pneumococcal Conjugate-13 08/26/2014   Tdap 11/30/2001   Zoster Recombinat (Shingrix) 08/20/2018, 12/18/2018     Review of Systems: Negative except as noted in the HPI.   Objective: There were no vitals filed for this visit.  KEMOND GANNAWAY is a pleasant 70 y.o. male in NAD. AAO X 3.  Vascular Examination: CFT <4 seconds b/l LE. Diminished pedal pulses b/l LE. Pedal hair absent. No pain with calf compression b/l. Lower extremity skin temperature gradient within normal limits. Lymphedema present BLE.  Dermatological Examination: Toenails 1-5 b/l elongated, discolored, dystrophic, thickened, crumbly with subungual debris and tenderness to dorsal palpation. No hyperkeratotic nor porokeratotic lesions present on today's visit. Skin b/l lower extremities noted to be thickened and brawny consistent with lymphedema.  Neurological Examination: Protective sensation intact 5/5 intact bilaterally with 10g monofilament b/l.  Musculoskeletal Examination: Muscle strength 5/5 to all lower extremity muscle groups bilaterally. Pes planus deformity noted bilateral LE. Utilizes cane for ambulation  assistance.  Footwear Assessment: Does the patient wear appropriate shoes? yes. Does the patient need inserts/orthotics? Yes.  Assessment: 1. Pain due to onychomycosis of toenails of both feet   2. Xerosis cutis   3. Lymphedema   4. Pes planus of both feet   5. Type II diabetes mellitus with peripheral circulatory disorder (HCC)   6. Encounter for diabetic foot exam ([place])      ADA Risk Categorization: High Risk  Patient has one or more of the following: Loss of protective sensation Absent pedal pulses Severe Foot deformity History of foot ulcer  Plan: -Advised patient to continue pedal hygiene and applying moisturizer daily.. -Diabetic foot examination performed today. -Continue foot and shoe inspections daily. Monitor blood glucose per PCP/Endocrinologist's recommendations. -Toenails 1-5 b/l were debrided in length and girth with sterile nail nippers and dremel without iatrogenic bleeding.  -Patient/POA to call should there be question/concern in the interim. Return in about 3 months (around 04/30/2022).  Marzetta Board, DPM

## 2022-02-13 ENCOUNTER — Other Ambulatory Visit: Payer: Self-pay | Admitting: Cardiovascular Disease

## 2022-02-13 DIAGNOSIS — I480 Paroxysmal atrial fibrillation: Secondary | ICD-10-CM

## 2022-02-13 NOTE — Telephone Encounter (Signed)
Prescription refill request for Eliquis received. ?Indication:Afib ?Last office visit:5/22 ?Scr:1.0 ?Age: 71 ?Weight:115.4 kg ? ?Prescription refilled ? ?

## 2022-03-18 ENCOUNTER — Other Ambulatory Visit: Payer: Self-pay | Admitting: Cardiovascular Disease

## 2022-04-04 DIAGNOSIS — R972 Elevated prostate specific antigen [PSA]: Secondary | ICD-10-CM | POA: Diagnosis not present

## 2022-04-11 DIAGNOSIS — N400 Enlarged prostate without lower urinary tract symptoms: Secondary | ICD-10-CM | POA: Diagnosis not present

## 2022-04-11 DIAGNOSIS — R972 Elevated prostate specific antigen [PSA]: Secondary | ICD-10-CM | POA: Diagnosis not present

## 2022-04-19 DIAGNOSIS — E1165 Type 2 diabetes mellitus with hyperglycemia: Secondary | ICD-10-CM | POA: Diagnosis not present

## 2022-04-19 DIAGNOSIS — Z125 Encounter for screening for malignant neoplasm of prostate: Secondary | ICD-10-CM | POA: Diagnosis not present

## 2022-04-19 DIAGNOSIS — E7800 Pure hypercholesterolemia, unspecified: Secondary | ICD-10-CM | POA: Diagnosis not present

## 2022-04-19 DIAGNOSIS — I110 Hypertensive heart disease with heart failure: Secondary | ICD-10-CM | POA: Diagnosis not present

## 2022-04-26 DIAGNOSIS — R82998 Other abnormal findings in urine: Secondary | ICD-10-CM | POA: Diagnosis not present

## 2022-04-26 DIAGNOSIS — Z794 Long term (current) use of insulin: Secondary | ICD-10-CM | POA: Diagnosis not present

## 2022-04-26 DIAGNOSIS — I480 Paroxysmal atrial fibrillation: Secondary | ICD-10-CM | POA: Diagnosis not present

## 2022-04-26 DIAGNOSIS — I5030 Unspecified diastolic (congestive) heart failure: Secondary | ICD-10-CM | POA: Diagnosis not present

## 2022-04-26 DIAGNOSIS — E1165 Type 2 diabetes mellitus with hyperglycemia: Secondary | ICD-10-CM | POA: Diagnosis not present

## 2022-04-26 DIAGNOSIS — I209 Angina pectoris, unspecified: Secondary | ICD-10-CM | POA: Diagnosis not present

## 2022-04-26 DIAGNOSIS — I110 Hypertensive heart disease with heart failure: Secondary | ICD-10-CM | POA: Diagnosis not present

## 2022-04-26 DIAGNOSIS — Z0000 Encounter for general adult medical examination without abnormal findings: Secondary | ICD-10-CM | POA: Diagnosis not present

## 2022-04-26 DIAGNOSIS — E1151 Type 2 diabetes mellitus with diabetic peripheral angiopathy without gangrene: Secondary | ICD-10-CM | POA: Diagnosis not present

## 2022-04-26 DIAGNOSIS — L850 Acquired ichthyosis: Secondary | ICD-10-CM | POA: Diagnosis not present

## 2022-05-01 ENCOUNTER — Encounter: Payer: Self-pay | Admitting: Podiatry

## 2022-05-01 ENCOUNTER — Ambulatory Visit (INDEPENDENT_AMBULATORY_CARE_PROVIDER_SITE_OTHER): Payer: Medicare HMO | Admitting: Podiatry

## 2022-05-01 DIAGNOSIS — M79675 Pain in left toe(s): Secondary | ICD-10-CM | POA: Diagnosis not present

## 2022-05-01 DIAGNOSIS — E1151 Type 2 diabetes mellitus with diabetic peripheral angiopathy without gangrene: Secondary | ICD-10-CM

## 2022-05-01 DIAGNOSIS — M79674 Pain in right toe(s): Secondary | ICD-10-CM

## 2022-05-01 DIAGNOSIS — B351 Tinea unguium: Secondary | ICD-10-CM | POA: Diagnosis not present

## 2022-05-01 DIAGNOSIS — L853 Xerosis cutis: Secondary | ICD-10-CM | POA: Diagnosis not present

## 2022-05-01 DIAGNOSIS — I890 Lymphedema, not elsewhere classified: Secondary | ICD-10-CM

## 2022-05-05 NOTE — Progress Notes (Signed)
  Subjective:  Patient ID: Raymond Gordon, male    DOB: Jan 05, 1951,  MRN: [id number]  Raymond Gordon presents to clinic today for at risk foot care. Pt has h/o NIDDM with PAD and painful elongated mycotic toenails 1-5 bilaterally which are tender when wearing enclosed shoe gear. Pain is relieved with periodic professional debridement.  Patient states blood glucose was 215 mg/dl today.  Last known HgA1c was 8.4%.  Patient is accompanied by his sister on today's visit. He has not been performing his lower extremity hygiene nor applying moisturizer as instructed. He does have limited range of motion to reach his feet.  New problem(s): None.   PCP is [name], Adelfa Koh, MD , and last visit was January 01, 2022.  No Known Allergies  Review of Systems: Negative except as noted in the HPI.  Objective: No changes noted in today's physical examination. There were no vitals filed for this visit.  Raymond Gordon is a pleasant 71 y.o. male in NAD. AAO X 3.  Vascular Examination: CFT <4 seconds b/l LE. Diminished pedal pulses b/l LE. Pedal hair absent. No pain with calf compression b/l. Lower extremity skin temperature gradient within normal limits. Lymphedema present BLE.  Dermatological Examination: Toenails 1-5 b/l elongated, discolored, dystrophic, thickened, crumbly with subungual debris and tenderness to dorsal palpation. No hyperkeratotic nor porokeratotic lesions present on today's visit. Skin b/l lower extremities noted to be thickened and brawny consistent with lymphedema. He has thickened hyperpigmented plaques on both lower extremities. No erythema, no edema, no drainage, no fluctuance.  Neurological Examination: Protective sensation intact 5/5 intact bilaterally with 10g monofilament b/l.  Musculoskeletal Examination: Muscle strength 5/5 to all lower extremity muscle groups bilaterally. Pes planus deformity noted bilateral LE. Utilizes cane for ambulation  assistance.  Assessment/Plan: 1. Pain due to onychomycosis of toenails of both feet   2. Xerosis cutis   3. Lymphedema   4. Type II diabetes mellitus with peripheral circulatory disorder [facility])      -Patient was evaluated and treated. All patient's and/or POA's questions/concerns answered on today's visit. -Discussed long handled applicators available at [facility]. He may use one applicator for hygiene to clean his legs and feet and another applicator for emollient application. He related understanding. -Mycotic toenails 1-5 bilaterally were debrided in length and girth with sterile nail nippers and dremel without incident. -Patient/POA to call should there be question/concern in the interim.   Return in about 3 months (around 08/01/2022).  [name], DPM

## 2022-06-07 ENCOUNTER — Ambulatory Visit: Payer: Medicare HMO | Admitting: Cardiovascular Disease

## 2022-06-07 ENCOUNTER — Encounter: Payer: Self-pay | Admitting: Cardiovascular Disease

## 2022-06-07 VITALS — BP 130/78 | HR 80 | Ht 64.0 in | Wt 264.0 lb

## 2022-06-07 DIAGNOSIS — I480 Paroxysmal atrial fibrillation: Secondary | ICD-10-CM | POA: Diagnosis not present

## 2022-06-07 DIAGNOSIS — G4733 Obstructive sleep apnea (adult) (pediatric): Secondary | ICD-10-CM | POA: Diagnosis not present

## 2022-06-07 DIAGNOSIS — I5032 Chronic diastolic (congestive) heart failure: Secondary | ICD-10-CM

## 2022-06-07 DIAGNOSIS — E782 Mixed hyperlipidemia: Secondary | ICD-10-CM | POA: Diagnosis not present

## 2022-06-07 DIAGNOSIS — I10 Essential (primary) hypertension: Secondary | ICD-10-CM | POA: Diagnosis not present

## 2022-06-07 NOTE — Assessment & Plan Note (Signed)
History of hyperlipidemia on atorvastatin with lipid profile performed 04/09/2021 revealing total cholesterol 138, LDL of 85 and HDL of 38.

## 2022-06-07 NOTE — Assessment & Plan Note (Signed)
History of essential hypertension a blood pressure measured today at 130/78.  He is on amlodipine, carvedilol and losartan.

## 2022-06-07 NOTE — Progress Notes (Signed)
Take care probably    06/07/2022 Raymond Gordon   Jun 22, 1951  [id number]  Primary Physician [name], Adelfa Koh, MD Primary Cardiologist: Runell Gess MD FACP, [place], [place], [place]  HPI:  Raymond Gordon is a 71 y.o.   severely overweight, single African American male with no children who I last  saw in the office 04/06/2021. 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 His risk factors including noninsulin-requiring diabetes, hypertension, hyperlipidemia and remote tobacco abuse having quit smoking back in 1999 and had smoked 1 pack per day. His father did have an MI and had bypass surgery. He was admitted to [facility] July 17, 2011, with chest pain and A-fib. He was catheterized by Dr. Italy Hilty on July 18, 2011, revealing minimal luminal irregularities with small vessel disease and normal LV function. He ultimately spontaneously converted to sinus rhythm and was placed on Pradaxa because of a CHADS2 score of 2. He has had no recurrent symptoms. He denies chest pain or shortness of breath.Dr. Wylene Simmer follows  his lipid profile. He does give symptoms compatible with obstructive sleep apnea and currently wears CPAP with benefit. He was admitted to [facility] 07/08/15 with A. Fib with RVR and chest pain. His enzymes were negative. He is placed in IV due to diltiazem and converted spontaneously to sinus rhythm. His amlodipine was changed to by mouth diltiazem. He has had no recurrent symptoms. Since I saw him in the office back in August of last year he was admitted to [facility] on 08/13/15 through 08/22/15 with diastolic heart failure. He was diuresed 21 L and was educated about a low-salt diet.   Since I  saw him in the office a year ago he continues to do well.  He remains on Pradaxa for PAF although he said no recurrent episodes.  He denies chest pain or shortness of breath.  He continues to wear his CPAP.   Current Meds  Medication Sig    allopurinol (ZYLOPRIM) 300 MG tablet Take 300 mg by mouth daily.    amLODipine (NORVASC) 10 MG tablet Take 1 tablet by mouth once daily   apixaban (ELIQUIS) 5 MG TABS tablet Take 1 tablet by mouth twice daily   aspirin EC 81 MG tablet Take 81 mg by mouth daily.   atorvastatin (LIPITOR) 80 MG tablet Take 1 tablet (80 mg total) by mouth daily. PATIENT MUST MAKE ANNUAL OFFICE VISIT FOR FUTURE REFILLS.   carvedilol (COREG) 25 MG tablet TAKE ONE & ONE-HALF TABLETS BY MOUTH TWICE DAILY   doxazosin (CARDURA) 4 MG tablet Take 4 mg by mouth daily.    FARXIGA 10 MG TABS tablet    finasteride (PROSCAR) 5 MG tablet Take 5 mg by mouth daily.   furosemide (LASIX) 80 MG tablet Take 1 tablet (80 mg total) by mouth 2 (two) times daily.   HUMULIN R U-500 KWIKPEN 500 UNIT/ML kwikpen INJECT 80 UNITS SUBCUTANEOUSLY WITH BREAKFAST AND 60 WITH LUNCH AND 80 WITH SUPPER   indomethacin (INDOCIN) 25 MG capsule one tablet PO BID PRN for gout   insulin aspart (NOVOLOG) 100 UNIT/ML injection Inject 30-60 Units into the skin 3 (three) times daily before meals.   Insulin Pen Needle (NOVOFINE) 30G X 8 MM MISC BID use for Flexped   isosorbide mononitrate (IMDUR) 30 MG 24 hr tablet Take 1 tablet (30 mg total) by mouth daily.   Lancets (ONETOUCH DELICA PLUS LANCET33G) MISC use to  self monitor blood glucose four times daily (E11.65)   losartan (COZAAR) 100 MG tablet Take 100 mg by mouth daily.   Multiple Vitamin (MULTIVITAMIN WITH MINERALS) TABS tablet Take 1 tablet by mouth daily.   omeprazole (PRILOSEC OTC) 20 MG tablet Take 1 tablet (20 mg total) by mouth daily.   omeprazole (PRILOSEC) 20 MG capsule Take 20 mg by mouth daily.   ONETOUCH VERIO test strip USE 1 STRIP TO CHECK GLUCOSE 4 TIMES DAILY   PFIZER-BIONT COVID-19 VAC-TRIS SUSP injection    PRESCRIPTION MEDICATION Inhale into the lungs at bedtime. Pt is on C Pap machine   spironolactone (ALDACTONE) 25 MG tablet Take 25 mg by mouth daily.   Sulfamethoxazole-Trimethoprim  (SULFAMETHOXAZOLE-TMP DS PO) Take by mouth 2 (two) times daily.   valsartan (DIOVAN) 160 MG tablet Take 1 tablet (160 mg total) by mouth daily.   Zoster Vaccine Adjuvanted [facility]) injection Inject series of 2 injections as directed     No Known Allergies  Social History   Socioeconomic History   Marital status: Single    Spouse name: Not on file   Number of children: Not on file   Years of education: Not on file   Highest education level: Not on file  Occupational History   Not on file  Tobacco Use   Smoking status: Former    Types: Cigarettes    Quit date: 11/25/1997    Years since quitting: 24.5   Smokeless tobacco: Former    Quit date: 06/14/2015  Substance and Sexual Activity   Alcohol use: No    Alcohol/week: 0.0 standard drinks of alcohol   Drug use: No   Sexual activity: Not on file  Other Topics Concern   Not on file  Social History Narrative   Not on file   Social Determinants of Health   Financial Resource Strain: Not on file  Food Insecurity: Not on file  Transportation Needs: Not on file  Physical Activity: Not on file  Stress: Not on file  Social Connections: Not on file  Intimate Partner Violence: Not on file     Review of Systems: General: negative for chills, fever, night sweats or weight changes.  Cardiovascular: negative for chest pain, dyspnea on exertion, edema, orthopnea, palpitations, paroxysmal nocturnal dyspnea or shortness of breath Dermatological: negative for rash Respiratory: negative for cough or wheezing Urologic: negative for hematuria Abdominal: negative for nausea, vomiting, diarrhea, bright red blood per rectum, melena, or hematemesis Neurologic: negative for visual changes, syncope, or dizziness All other systems reviewed and are otherwise negative except as noted above.    Blood pressure 130/78, pulse 80, height 5\' 4"  (1.626 m), weight 264 lb (119.7 kg), SpO2 97 %.  General appearance: alert and no distress Neck: no  adenopathy, no carotid bruit, no JVD, supple, symmetrical, trachea midline, and thyroid not enlarged, symmetric, no tenderness/mass/nodules Lungs: clear to auscultation bilaterally Heart: regular rate and rhythm, S1, S2 normal, no murmur, click, rub or gallop Extremities: 1-2+ bilateral lower extremity edema Pulses: 2+ and symmetric Skin: Skin color, texture, turgor normal. No rashes or lesions Neurologic: Grossly normal  EKG sinus rhythm at 80 without ST or T wave changes.  ASSESSMENT AND PLAN:   Paroxysmal atrial fibrillation (HCC) History of paroxysmal atrial fibrillation maintaining sinus rhythm on Eliquis oral anticoagulation.  Essential hypertension History of essential hypertension a blood pressure measured today at 130/78.  He is on amlodipine, carvedilol and losartan.  HLD (hyperlipidemia) History of hyperlipidemia on atorvastatin with lipid profile performed 04/09/2021  revealing total cholesterol 138, LDL of 85 and HDL of 38.  Severe OSA (obstructive sleep apnea) History of obstructive sleep apnea on CPAP.  Chronic diastolic heart failure (HCC) History of chronic diastolic heart failure on spironolactone and furosemide.     Runell Gess MD FACP,FACC,FAHA, [facility] 06/07/2022 4:15 PM

## 2022-06-07 NOTE — Patient Instructions (Signed)

## 2022-06-07 NOTE — Assessment & Plan Note (Addendum)
History of paroxysmal atrial fibrillation maintaining sinus rhythm on Eliquis oral anticoagulation. 

## 2022-06-07 NOTE — Assessment & Plan Note (Signed)
History of chronic diastolic heart failure on spironolactone and furosemide.

## 2022-06-07 NOTE — Assessment & Plan Note (Signed)
History of obstructive sleep apnea on CPAP. 

## 2022-06-17 ENCOUNTER — Other Ambulatory Visit: Payer: Self-pay | Admitting: Cardiovascular Disease

## 2022-07-02 ENCOUNTER — Other Ambulatory Visit: Payer: Self-pay | Admitting: Cardiovascular Disease

## 2022-08-09 DIAGNOSIS — L850 Acquired ichthyosis: Secondary | ICD-10-CM | POA: Diagnosis not present

## 2022-08-09 DIAGNOSIS — E1165 Type 2 diabetes mellitus with hyperglycemia: Secondary | ICD-10-CM | POA: Diagnosis not present

## 2022-08-09 DIAGNOSIS — Z794 Long term (current) use of insulin: Secondary | ICD-10-CM | POA: Diagnosis not present

## 2022-08-09 DIAGNOSIS — I110 Hypertensive heart disease with heart failure: Secondary | ICD-10-CM | POA: Diagnosis not present

## 2022-08-09 DIAGNOSIS — E113393 Type 2 diabetes mellitus with moderate nonproliferative diabetic retinopathy without macular edema, bilateral: Secondary | ICD-10-CM | POA: Diagnosis not present

## 2022-08-09 DIAGNOSIS — I480 Paroxysmal atrial fibrillation: Secondary | ICD-10-CM | POA: Diagnosis not present

## 2022-08-09 DIAGNOSIS — E1151 Type 2 diabetes mellitus with diabetic peripheral angiopathy without gangrene: Secondary | ICD-10-CM | POA: Diagnosis not present

## 2022-08-09 DIAGNOSIS — I5030 Unspecified diastolic (congestive) heart failure: Secondary | ICD-10-CM | POA: Diagnosis not present

## 2022-08-09 DIAGNOSIS — I209 Angina pectoris, unspecified: Secondary | ICD-10-CM | POA: Diagnosis not present

## 2022-08-12 ENCOUNTER — Ambulatory Visit (INDEPENDENT_AMBULATORY_CARE_PROVIDER_SITE_OTHER): Payer: Medicare HMO | Admitting: Podiatry

## 2022-08-12 ENCOUNTER — Encounter: Payer: Self-pay | Admitting: Podiatry

## 2022-08-12 DIAGNOSIS — B351 Tinea unguium: Secondary | ICD-10-CM | POA: Diagnosis not present

## 2022-08-12 DIAGNOSIS — L853 Xerosis cutis: Secondary | ICD-10-CM

## 2022-08-12 DIAGNOSIS — M79674 Pain in right toe(s): Secondary | ICD-10-CM

## 2022-08-12 DIAGNOSIS — E1151 Type 2 diabetes mellitus with diabetic peripheral angiopathy without gangrene: Secondary | ICD-10-CM

## 2022-08-12 DIAGNOSIS — M79675 Pain in left toe(s): Secondary | ICD-10-CM | POA: Diagnosis not present

## 2022-08-12 NOTE — Patient Instructions (Signed)
To Home Health Aide or Family Member: Under no circumstances is patient to attempt this on their own.  ONCE WEEKLY FOOT SOAK INSTRUCTIONS FOR FOOT HYGIENE   SOAK FEET IN LUKEWARM SOAPY WATER FOR 10 MINUTES. HAVE A FAMILY MEMBER OR CAREGIVER CHECK THE WATER TEMPERATURE FOR YOU BEFORE SUBMERGING YOUR FEET IN THE WATER. GENTLY CLEAN BETWEEN TOES AND UNDER TOES WITH Q-TIPS.  2.  DRY FEET WELL TAKING CARE TO DRY WELL BETWEEN TOES AND UNDER TOES.  3.  APPLY Aquaphor Ointment TO FEET AVOIDING APPLICATION BETWEEN TOES.  For extremely dry, cracked feet: moisturize feet once daily; do not apply between toes A. CeraVe Healing Ointment B. Aquaphor Healing Ointment  If you have problems reaching your feet: apply to feet once daily; do not apply between toes A.  Aquaphor Advanced Therapy Ointment Body Spray B.  Vaseline Intensive Care Spray Lotion Advanced Repair

## 2022-08-17 NOTE — Progress Notes (Signed)
  Subjective:  Patient ID: Raymond Gordon, male    DOB: 09-02-1951,  MRN: [id number]  Raymond Gordon presents to clinic today for at risk foot care. Pt has h/o NIDDM with PAD and painful thick toenails that are difficult to trim. Pain interferes with ambulation. Aggravating factors include wearing enclosed shoe gear. Pain is relieved with periodic professional debridement.  Patient states blood glucose was 202 mg/dl today. Last known  HgA1c was 8.2%.    New problem(s): None.   He is accompanied by his sister, Raymond Gordon, on today's visit. Sister states Raymond Gordon has not been compliant with applying moisturizer to his feet as instructed. She states Dr. Osborne Casco has also spoken to him about this. He does have an aid that comes to visit and Raymond Gordon states she will see about the aid assisting him with his foot hygiene and moisturizing.   PCP is [name], Fransico Him, MD , and last visit was  August 09, 2022.  No Known Allergies  Review of Systems: Negative except as noted in the HPI.  Objective: No changes noted in today's physical examination.  Raymond Gordon is a pleasant 71 y.o. male in NAD. AAO x 3.  Vascular Examination: CFT <4 seconds b/l LE. Diminished pedal pulses b/l LE. Pedal hair absent. No pain with calf compression b/l. Lower extremity skin temperature gradient within normal limits. Lymphedema present BLE.  Dermatological Examination: Toenails 1-5 b/l elongated, discolored, dystrophic, thickened, crumbly with subungual debris and tenderness to dorsal palpation. No hyperkeratotic nor porokeratotic lesions present on today's visit. Skin b/l lower extremities noted to be thickened and brawny consistent with lymphedema.   He has thickened hyperpigmented plaques on both lower extremities. No erythema, no edema, no drainage, no fluctuance.  Pedal skin noted to exhibit signs of poor pedal hygiene with noted foot odor b/l and interdigital debris. No open wounds noted.  Neurological  Examination: Protective sensation intact 5/5 intact bilaterally with 10g monofilament b/l.  Musculoskeletal Examination: Muscle strength 5/5 to all lower extremity muscle groups bilaterally. Pes planus deformity noted bilateral LE. Utilizes cane for ambulation assistance.  Assessment/Plan: 1. Pain due to onychomycosis of toenails of both feet   2. Xerosis cutis   3. Type II diabetes mellitus with peripheral circulatory disorder (HCC)   -Examined patient. -Toenails 1-5 b/l were debrided in length and girth with sterile nail nippers and dremel without iatrogenic bleeding.  -Offending nail border debrided and curretaged R 3rd toe utilizing sterile nail nipper and currette. Border(s) cleansed with alcohol and TAO applied. Patient/POA/Caregiver/Facility instructed to apply Neosporin Cream  to R 3rd toe once daily for 7 days. Call office if there are any concerns. -Written instructions dispensed for weekly foot soaks for poor pedal hygiene. -Patient/POA to call should there be question/concern in the interim.   Return in about 3 months (around 11/11/2022).  Marzetta Board, DPM

## 2022-09-03 ENCOUNTER — Other Ambulatory Visit: Payer: Self-pay | Admitting: Cardiovascular Disease

## 2022-09-03 DIAGNOSIS — I480 Paroxysmal atrial fibrillation: Secondary | ICD-10-CM

## 2022-09-03 NOTE — Telephone Encounter (Signed)
Prescription refill request for Eliquis received. Indication: PAF Last office visit:  06/07/22  Adora Fridge MD Scr: 1.03 on 11/02/21 Age: 71 Weight: 119.7kg  Based on above findings Eliquis 5mg  twice daily is the appropriate dose.  Refill approved.

## 2022-10-09 DIAGNOSIS — R972 Elevated prostate specific antigen [PSA]: Secondary | ICD-10-CM | POA: Diagnosis not present

## 2022-11-13 ENCOUNTER — Ambulatory Visit (INDEPENDENT_AMBULATORY_CARE_PROVIDER_SITE_OTHER): Payer: Medicare HMO | Admitting: Podiatry

## 2022-11-13 ENCOUNTER — Encounter: Payer: Self-pay | Admitting: Podiatry

## 2022-11-13 DIAGNOSIS — E1151 Type 2 diabetes mellitus with diabetic peripheral angiopathy without gangrene: Secondary | ICD-10-CM

## 2022-11-13 DIAGNOSIS — B351 Tinea unguium: Secondary | ICD-10-CM

## 2022-11-13 DIAGNOSIS — M79674 Pain in right toe(s): Secondary | ICD-10-CM | POA: Diagnosis not present

## 2022-11-13 DIAGNOSIS — M79675 Pain in left toe(s): Secondary | ICD-10-CM

## 2022-11-13 DIAGNOSIS — L84 Corns and callosities: Secondary | ICD-10-CM | POA: Diagnosis not present

## 2022-11-13 DIAGNOSIS — L601 Onycholysis: Secondary | ICD-10-CM

## 2022-11-13 NOTE — Progress Notes (Unsigned)
  Subjective:  Patient ID: Raymond Gordon, male    DOB: 1951-06-29,  MRN: [id number]  Raymond Gordon presents to clinic today for {jgcomplaint:23593}  Chief Complaint  Patient presents with   Nail Problem    DFC  BG - 198 this morning A1C - pt does not recall  PCP - Dr Wylene Simmer , last OV 07/2022   New problem(s): None. {jgcomplaint:23593}  PCP is [name], Adelfa Koh, MD.  No Known Allergies  Review of Systems: Negative except as noted in the HPI.  Objective: No changes noted in today's physical examination. There were no vitals filed for this visit. Raymond Gordon is a pleasant 71 y.o. male {jgbodyhabitus:24098} AAO x 3.   Vascular Examination: CFT <4 seconds b/l LE. Diminished pedal pulses b/l LE. Pedal hair absent. No pain with calf compression b/l. Lower extremity skin temperature gradient within normal limits. Lymphedema present BLE.  Dermatological Examination: Toenails 1-5 b/l elongated, discolored, dystrophic, thickened, crumbly with subungual debris and tenderness to dorsal palpation. No hyperkeratotic nor porokeratotic lesions present on today's visit. Skin b/l lower extremities noted to be thickened and brawny consistent with lymphedema.   He has thickened hyperpigmented plaques on both lower extremities. No erythema, no edema, no drainage, no fluctuance.  Pedal skin noted to exhibit signs of poor pedal hygiene with noted foot odor b/l and interdigital debris. No open wounds noted.  Neurological Examination: Protective sensation intact 5/5 intact bilaterally with 10g monofilament b/l.  Musculoskeletal Examination: Muscle strength 5/5 to all lower extremity muscle groups bilaterally. Pes planus deformity noted bilateral LE. Utilizes cane for ambulation assistance.  Assessment/Plan: 1. Pain due to onychomycosis of toenails of both feet   2. Type II diabetes mellitus with peripheral circulatory disorder (HCC)     No orders of the defined types were placed in this  encounter.   None {Jgplan:23602::"-Patient/POA to call should there be question/concern in the interim."}   Return in about 3 months (around 02/12/2023).  [name], DPM

## 2022-11-26 DIAGNOSIS — I5030 Unspecified diastolic (congestive) heart failure: Secondary | ICD-10-CM | POA: Diagnosis not present

## 2022-11-26 DIAGNOSIS — L850 Acquired ichthyosis: Secondary | ICD-10-CM | POA: Diagnosis not present

## 2022-11-26 DIAGNOSIS — E113393 Type 2 diabetes mellitus with moderate nonproliferative diabetic retinopathy without macular edema, bilateral: Secondary | ICD-10-CM | POA: Diagnosis not present

## 2022-11-26 DIAGNOSIS — I110 Hypertensive heart disease with heart failure: Secondary | ICD-10-CM | POA: Diagnosis not present

## 2022-11-26 DIAGNOSIS — E1165 Type 2 diabetes mellitus with hyperglycemia: Secondary | ICD-10-CM | POA: Diagnosis not present

## 2022-11-26 DIAGNOSIS — I209 Angina pectoris, unspecified: Secondary | ICD-10-CM | POA: Diagnosis not present

## 2022-11-26 DIAGNOSIS — Z7901 Long term (current) use of anticoagulants: Secondary | ICD-10-CM | POA: Diagnosis not present

## 2022-11-26 DIAGNOSIS — Z23 Encounter for immunization: Secondary | ICD-10-CM | POA: Diagnosis not present

## 2022-11-26 DIAGNOSIS — I480 Paroxysmal atrial fibrillation: Secondary | ICD-10-CM | POA: Diagnosis not present

## 2023-03-02 ENCOUNTER — Other Ambulatory Visit: Payer: Self-pay | Admitting: Cardiovascular Disease

## 2023-03-02 DIAGNOSIS — I480 Paroxysmal atrial fibrillation: Secondary | ICD-10-CM

## 2023-03-03 ENCOUNTER — Ambulatory Visit (INDEPENDENT_AMBULATORY_CARE_PROVIDER_SITE_OTHER): Payer: Medicare HMO | Admitting: Podiatry

## 2023-03-03 VITALS — BP 170/69 | HR 92

## 2023-03-03 DIAGNOSIS — M79675 Pain in left toe(s): Secondary | ICD-10-CM | POA: Diagnosis not present

## 2023-03-03 DIAGNOSIS — M79674 Pain in right toe(s): Secondary | ICD-10-CM | POA: Diagnosis not present

## 2023-03-03 DIAGNOSIS — M2141 Flat foot [pes planus] (acquired), right foot: Secondary | ICD-10-CM

## 2023-03-03 DIAGNOSIS — E119 Type 2 diabetes mellitus without complications: Secondary | ICD-10-CM

## 2023-03-03 DIAGNOSIS — I890 Lymphedema, not elsewhere classified: Secondary | ICD-10-CM

## 2023-03-03 DIAGNOSIS — M2142 Flat foot [pes planus] (acquired), left foot: Secondary | ICD-10-CM | POA: Diagnosis not present

## 2023-03-03 DIAGNOSIS — L84 Corns and callosities: Secondary | ICD-10-CM

## 2023-03-03 DIAGNOSIS — E1151 Type 2 diabetes mellitus with diabetic peripheral angiopathy without gangrene: Secondary | ICD-10-CM | POA: Diagnosis not present

## 2023-03-03 DIAGNOSIS — B351 Tinea unguium: Secondary | ICD-10-CM | POA: Diagnosis not present

## 2023-03-03 NOTE — Telephone Encounter (Signed)
Prescription refill request for Eliquis received. Indication: Afib  Last office visit: 06/07/22 Allyson Sabal) Scr: 0.9 (11/27/22 via [facility]) Age: 72 Weight: 119.7kg  Appropriate dose. Refill sent.

## 2023-03-03 NOTE — Progress Notes (Signed)
ANNUAL DIABETIC FOOT EXAM  Subjective: Raymond Gordon presents today for annual diabetic foot examination.  Chief Complaint  Patient presents with   Diabetes    [facility] BS - 216 A1C - 7.8 LVPCP - 11/26/22   Patient confirms h/o diabetes.  Patient relates 26 year h/o diabetes.  Patient denies any h/o foot wounds.  Patient denies any numbness, tingling, burning, or pins/needle sensation in feet.  Risk factors: diabetes, HTN, CHF, hyperlipidemia, h/o tobacco use in remission.  [name], Adelfa Koh, MD is patient's PCP.  Past Medical History:  Diagnosis Date   Bilateral lower extremity edema    Chronic diastolic CHF (congestive heart failure) (HCC) 08/15/2015   a. 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   Pain due to onychomycosis of toenails of both feet 04/12/2021   Body mass index (BMI) 40.0-44.9, adult 04/11/2021   Hyperglycemia due to type 2 diabetes mellitus 04/11/2021   Vitreomacular adhesion of both eyes 04/03/2021   Moderate nonproliferative diabetic retinopathy of both eyes without macular edema associated with type 2 diabetes mellitus 04/03/2021   Posterior capsular opacification, right eye 04/03/2021   Posterior capsular opacification, left 04/03/2021   Thrombophilia 04/10/2020   Chronic anticoagulation 12/19/2016   Encounter for general adult medical examination without abnormal findings 02/26/2016   Chronic diastolic heart failure 09/13/2015   Angina pectoris 09/12/2015   Bacteriuria, asymptomatic 08/19/2015   Acute diastolic CHF (congestive heart failure) 08/13/2015   Acute respiratory failure with hypoxia 08/13/2015   Uncontrolled diabetes mellitus 08/13/2015   Atrial fibrillation 08/13/2015    Atrial fibrillation with RVR 07/08/2015   Acute urinary obstruction 07/08/2015   Prolonged Q-T interval on ECG 07/08/2015   Severe OSA (obstructive sleep apnea) 06/15/2015   Bilateral lower extremity edema 06/15/2015   Edema 03/13/2015   Pulmonary edema 05/20/2014   Hypoglycemia 05/20/2014   Syncope and collapse 05/20/2014    Class: Acute   CHF (congestive heart failure), NYHA class I (HCC) 05/20/2014   Paroxysmal atrial fibrillation 11/30/2013   Morbid obesity 11/30/2013   Essential hypertension 11/30/2013   HLD (hyperlipidemia) 11/30/2013   Diabetes 11/30/2013   Cardiomegaly 02/16/2013   Elevated PSA 02/16/2013   Gout 12/04/2009   Pure hypercholesterolemia 10/28/2009   Past Surgical History:  Procedure Laterality Date   CARDIAC CATHETERIZATION     CARDIAC CATHETERIZATION N/A 2012   no large vessel occlusions   Current Outpatient Medications on File Prior to Visit  Medication Sig Dispense Refill   allopurinol (ZYLOPRIM) 300 MG tablet Take 300 mg by mouth daily.      amLODipine (NORVASC) 10 MG tablet Take 1 tablet by mouth once daily 90 tablet 3   apixaban (ELIQUIS) 5 MG TABS tablet Take 1 tablet by mouth twice daily 180 tablet 1   aspirin EC 81 MG tablet Take 81 mg by mouth daily.     atorvastatin (LIPITOR) 80 MG tablet Take 1 tablet (80 mg total) by mouth daily. 90 tablet 2   carvedilol (COREG) 25 MG tablet TAKE ONE & ONE-HALF TABLETS BY MOUTH TWICE DAILY 90 tablet 3   doxazosin (CARDURA) 4 MG tablet Take 4 mg by mouth daily.  FARXIGA 10 MG TABS tablet      finasteride (PROSCAR) 5 MG tablet Take 5 mg by mouth daily.     furosemide (LASIX) 80 MG tablet Take 1 tablet (80 mg total) by mouth 2 (two) times daily. 60 tablet 0   HUMULIN R U-500 KWIKPEN 500 UNIT/ML kwikpen INJECT 80 UNITS SUBCUTANEOUSLY WITH BREAKFAST AND 60 WITH LUNCH AND 80 WITH SUPPER     indomethacin (INDOCIN) 25 MG capsule one tablet PO BID PRN for gout     insulin aspart (NOVOLOG) 100 UNIT/ML injection  Inject 30-60 Units into the skin 3 (three) times daily before meals.     Insulin Pen Needle (NOVOFINE) 30G X 8 MM MISC BID use for Flexped     isosorbide mononitrate (IMDUR) 30 MG 24 hr tablet Take 1 tablet (30 mg total) by mouth daily.     Lancets (ONETOUCH DELICA PLUS LANCET33G) MISC use to self monitor blood glucose four times daily (E11.65)     losartan (COZAAR) 100 MG tablet Take 100 mg by mouth daily.     Multiple Vitamin (MULTIVITAMIN WITH MINERALS) TABS tablet Take 1 tablet by mouth daily.     omeprazole (PRILOSEC OTC) 20 MG tablet Take 1 tablet (20 mg total) by mouth daily.     omeprazole (PRILOSEC) 20 MG capsule Take 20 mg by mouth daily.     ONETOUCH VERIO test strip USE 1 STRIP TO CHECK GLUCOSE 4 TIMES DAILY     PFIZER-BIONT COVID-19 VAC-TRIS SUSP injection      PRESCRIPTION MEDICATION Inhale into the lungs at bedtime. Pt is on C Pap machine     spironolactone (ALDACTONE) 25 MG tablet Take 25 mg by mouth daily.     Sulfamethoxazole-Trimethoprim (SULFAMETHOXAZOLE-TMP DS PO) Take by mouth 2 (two) times daily.     valsartan (DIOVAN) 160 MG tablet Take 1 tablet (160 mg total) by mouth daily. 90 tablet 3   Zoster Vaccine Adjuvanted [facility](SHINGRIX) injection Inject series of 2 injections as directed     No current facility-administered medications on file prior to visit.    No Known Allergies Social History   Occupational History   Not on file  Tobacco Use   Smoking status: Former    Types: Cigarettes    Quit date: 11/25/1997    Years since quitting: 25.2   Smokeless tobacco: Former    Quit date: 06/14/2015  Substance and Sexual Activity   Alcohol use: No    Alcohol/week: 0.0 standard drinks of alcohol   Drug use: No   Sexual activity: Not on file   Family History  Problem Relation Age of Onset   Heart failure Brother    Hyperlipidemia Brother    Hypertension Brother    Stroke Brother    Hypertension Mother    Hyperlipidemia Mother    Diabetes Mother    Cancer Father         prostate   GER disease Father    Stroke Father    Hypertension Sister    Hyperlipidemia Sister    Hypertension Maternal Grandmother    Stroke Maternal Grandmother    Diabetes Maternal Grandfather    Hypertension Maternal Grandfather    Immunization History  Administered Date(s) Administered   Influenza Split 08/24/2010, 08/08/2011, 08/25/2012, 10/12/2013, 08/26/2014   Influenza, High Dose Seasonal PF 10/03/2016, 10/01/2017   Influenza, Quadrivalent, Recombinant, Inj, Pf 08/20/2018   Influenza,inj,Quad PF,6+ Mos 08/26/2014, 07/27/2015   Influenza-Unspecified 08/11/2015   Pneumococcal Conjugate-13 08/26/2014   Tdap 11/30/2001   Zoster  Recombinat (Shingrix) 08/20/2018, 12/18/2018     Review of Systems: Negative except as noted in the HPI.   Objective: Vitals:   03/03/23 1311  BP: (!) 170/69  Pulse: 92    Raymond Gordon is a pleasant 72 y.o. male in NAD. AAO X 3.  Vascular Examination: CFT <3 seconds b/l LE. Faintly palpable DP pulses b/l LE. Faintly palpable PT pulse(s) b/l LE. Pedal hair absent. No pain with calf compression b/l. Lower extremity skin temperature gradient within normal limits. Lymphedema present BLE. No ischemia or gangrene noted b/l LE. No cyanosis or clubbing noted b/l LE.  Dermatological Examination: Pedal skin is warm and supple b/l LE. No open wounds b/l LE. No interdigital macerations noted b/l LE. Toenails 1-5 bilaterally elongated, discolored, dystrophic, thickened, and crumbly with subungual debris and tenderness to dorsal palpation.   Hyperkeratotic lesion(s) submet head 5 right foot.  No erythema, no edema, no drainage, no fluctuance.  Dried heme noted lateral border right 3rd digit. Incurvated nail lateral border. No erythema, no edema, no drainage, no fluctuance.  Pedal skin noted to exhibit signs of poor pedal hygiene with noted foot odor b/l and interdigital debris. No open wounds noted.   Neurological Examination: Protective sensation  intact 5/5 intact bilaterally with 10g monofilament b/l.  Musculoskeletal Examination: Muscle strength 5/5 to all lower extremity muscle groups bilaterally. Pes planus deformity noted bilateral LE. Utilizes cane for ambulation assistance.  Footwear Assessment: Does the patient wear appropriate shoes? Yes. Does the patient need inserts/orthotics? Yes.  Lab Results  Component Value Date   HGBA1C 8.7 (H) 08/14/2015   ADA Risk Categorization: Low Risk :  Patient has all of the following: Intact protective sensation No prior foot ulcer  No severe deformity Pedal pulses present  Assessment: 1. Pain due to onychomycosis of toenails of both feet   2. Callus   3. Lymphedema   4. Pes planus of both feet   5. Type II diabetes mellitus with peripheral circulatory disorder   6. Encounter for diabetic foot exam     Plan: No orders of the defined types were placed in this encounter. -Patient's family member present. All questions/concerns addressed on today's visit. -Cleansed right 3rd digit. Light bleeding addressed with Lumicain Hemostatic solution. Cleansed with alcohol and triple antibiotic ointment applied. No further treatment required. -Diabetic foot examination performed today. -Continue diabetic foot care principles: inspect feet daily, monitor glucose as recommended by PCP and/or Endocrinologist, and follow prescribed diet per PCP, Endocrinologist and/or dietician. -Patient to continue soft, supportive shoe gear daily. -Mycotic toenails 1-5 bilaterally were debrided in length and girth with sterile nail nippers and dremel without incident. -Callus(es) submet head 5 right foot pared utilizing sharp debridement with sterile blade without complication or incident. Total number debrided =1. -Patient counseled on pedal hygiene and need for daily moisturizer. Presently noncompliant. He related understanding. -Patient/POA to call should there be question/concern in the interim. Return in  about 3 months (around 06/02/2023).  Freddie BreechJennifer L Ivone Licht, DPM

## 2023-03-08 ENCOUNTER — Encounter: Payer: Self-pay | Admitting: Podiatry

## 2023-03-13 DIAGNOSIS — L850 Acquired ichthyosis: Secondary | ICD-10-CM | POA: Diagnosis not present

## 2023-03-13 DIAGNOSIS — I480 Paroxysmal atrial fibrillation: Secondary | ICD-10-CM | POA: Diagnosis not present

## 2023-03-13 DIAGNOSIS — I110 Hypertensive heart disease with heart failure: Secondary | ICD-10-CM | POA: Diagnosis not present

## 2023-03-13 DIAGNOSIS — E1151 Type 2 diabetes mellitus with diabetic peripheral angiopathy without gangrene: Secondary | ICD-10-CM | POA: Diagnosis not present

## 2023-03-13 DIAGNOSIS — Z7901 Long term (current) use of anticoagulants: Secondary | ICD-10-CM | POA: Diagnosis not present

## 2023-03-13 DIAGNOSIS — I5030 Unspecified diastolic (congestive) heart failure: Secondary | ICD-10-CM | POA: Diagnosis not present

## 2023-03-13 DIAGNOSIS — Z794 Long term (current) use of insulin: Secondary | ICD-10-CM | POA: Diagnosis not present

## 2023-03-17 ENCOUNTER — Other Ambulatory Visit: Payer: Self-pay | Admitting: Cardiovascular Disease

## 2023-03-26 ENCOUNTER — Telehealth: Payer: Self-pay | Admitting: Cardiovascular Disease

## 2023-03-26 DIAGNOSIS — Z01818 Encounter for other preprocedural examination: Secondary | ICD-10-CM

## 2023-03-26 DIAGNOSIS — I480 Paroxysmal atrial fibrillation: Secondary | ICD-10-CM

## 2023-03-26 NOTE — Telephone Encounter (Signed)
Left message for the patient to contact office.  

## 2023-03-26 NOTE — Telephone Encounter (Signed)
I s/w pt's sister and made aware the pt will need updated lab work bmet, cbc needed for pre op clearance. Pt will go to [facility] office either tomorrow or Friday of this week and have labs done.

## 2023-03-26 NOTE — Telephone Encounter (Signed)
Pt sister returned call

## 2023-03-26 NOTE — Telephone Encounter (Signed)
Callback team patient will need updated CBC and BMET before guidance can be completed for holding Eliquis.   Thank you

## 2023-03-26 NOTE — Telephone Encounter (Signed)
   Pre-operative Risk Assessment    Patient Name: Raymond Gordon  DOB: Mar 28, 1951 MRN: [id number]     Request for Surgical Clearance    Procedure:  Dental Extraction - Amount of Teeth to be Pulled:  4  Date of Surgery:  Clearance TBD                                 Surgeon:  Dr. Chilton Si  Surgeon's Group or Practice Name:    Phone number:  [phone] Fax number:  [phone]   Type of Clearance Requested:   - Medical  - Pharmacy:  Hold Apixaban (Eliquis) TBD   Type of Anesthesia:  Local    Additional requests/questions:      SignedFilomena Jungling   03/26/2023, 12:18 PM

## 2023-03-26 NOTE — Addendum Note (Signed)
Addended by: Tarri Fuller on: 03/26/2023 01:14 PM   Modules accepted: Orders

## 2023-03-26 NOTE — Addendum Note (Signed)
Addended by: Alveta Heimlich on: 03/26/2023 12:43 PM   Modules accepted: Orders

## 2023-03-27 DIAGNOSIS — I480 Paroxysmal atrial fibrillation: Secondary | ICD-10-CM | POA: Diagnosis not present

## 2023-03-27 DIAGNOSIS — Z01818 Encounter for other preprocedural examination: Secondary | ICD-10-CM | POA: Diagnosis not present

## 2023-03-28 LAB — CBC
Hematocrit: 37.6 % (ref 37.5–51.0)
Hemoglobin: 12.3 g/dL — ABNORMAL LOW (ref 13.0–17.7)
MCH: 28.9 pg (ref 26.6–33.0)
MCHC: 32.7 g/dL (ref 31.5–35.7)
MCV: 89 fL (ref 79–97)
Platelets: 319 10*3/uL (ref 150–450)
RBC: 4.25 x10E6/uL (ref 4.14–5.80)
RDW: 14.1 % (ref 11.6–15.4)
WBC: 5.7 10*3/uL (ref 3.4–10.8)

## 2023-03-28 LAB — BASIC METABOLIC PANEL
BUN/Creatinine Ratio: 17 (ref 10–24)
BUN: 16 mg/dL (ref 8–27)
CO2: 22 mmol/L (ref 20–29)
Calcium: 9.7 mg/dL (ref 8.6–10.2)
Chloride: 107 mmol/L — ABNORMAL HIGH (ref 96–106)
Creatinine, Ser: 0.96 mg/dL (ref 0.76–1.27)
Glucose: 86 mg/dL (ref 70–99)
Potassium: 3.9 mmol/L (ref 3.5–5.2)
Sodium: 147 mmol/L — ABNORMAL HIGH (ref 134–144)
eGFR: 84 mL/min/{1.73_m2} (ref >59)

## 2023-04-01 NOTE — Telephone Encounter (Signed)
   Name: Raymond Gordon  DOB: 1951/07/15  MRN: [id number]  Primary Cardiologist: None   Preoperative team, please contact this patient and set up a phone call appointment for further preoperative risk assessment. Please obtain consent and complete medication review. Thank you for your help.  I confirm that guidance regarding antiplatelet and oral anticoagulation therapy has been completed and, if necessary, noted below. Per pharm D: Patient with diagnosis of afib on Eliquis for anticoagulation.     Procedure: 4 dental extractions Date of procedure: TBD   CHA2DS2-VASc Score = 4  This indicates a 4.8% annual risk of stroke. The patient's score is based upon: CHF History: 1 HTN History: 1 Diabetes History: 1 Stroke History: 0 Vascular Disease History: 0 Age Score: 1 Gender Score: 0   CrCl 62mL/min using adj body weight Platelet count 319K   Patient does not require pre-op antibiotics for dental procedure.   Per office protocol, patient can hold Eliquis for 1 day prior to procedure.      Carlos Levering, NP 04/01/2023, 3:03 PM  [facility]

## 2023-04-01 NOTE — Telephone Encounter (Signed)
Patient with diagnosis of afib on Eliquis for anticoagulation.    Procedure: 4 dental extractions Date of procedure: TBD  CHA2DS2-VASc Score = 4  This indicates a 4.8% annual risk of stroke. The patient's score is based upon: CHF History: 1 HTN History: 1 Diabetes History: 1 Stroke History: 0 Vascular Disease History: 0 Age Score: 1 Gender Score: 0   CrCl 74mL/min using adj body weight Platelet count 319K  Patient does not require pre-op antibiotics for dental procedure.  Per office protocol, patient can hold Eliquis for 1 day prior to procedure.    **This guidance is not considered finalized until pre-operative APP has relayed final recommendations.**

## 2023-04-01 NOTE — Telephone Encounter (Addendum)
I will forward back to pre op to see phone notes 03/26/23 and notes from Dr. Allyson Sabal. Need to clarify if the pt has been cleared yet.

## 2023-04-03 NOTE — Telephone Encounter (Signed)
Left message for the pt to call back for tele pre op appt.  ?

## 2023-04-04 ENCOUNTER — Telehealth: Payer: Self-pay | Admitting: *Deleted

## 2023-04-04 NOTE — Telephone Encounter (Signed)
I s/w the pt's DPR sister who helps care for the pt. She scheduled a tele pre op appt for the pt for 04/11/23 @ 10:20. Med rec and consent are done.

## 2023-04-04 NOTE — Telephone Encounter (Signed)
I s/w the pt's DPR sister who helps care for the pt. She scheduled a tele pre op appt for the pt for 04/11/23 @ 10:20. Med rec and consent are done.     Patient Consent for Virtual Visit        Raymond Gordon has provided verbal consent on 04/04/2023 for a virtual visit (video or telephone).   CONSENT FOR VIRTUAL VISIT FOR:  Raymond Gordon  By participating in this virtual visit I agree to the following:  I hereby voluntarily request, consent and authorize [facility] and its employed or contracted physicians, physician assistants, nurse practitioners or other licensed health care professionals (the Practitioner), to provide me with telemedicine health care services (the "Services") as deemed necessary by the treating Practitioner. I acknowledge and consent to receive the Services by the Practitioner via telemedicine. I understand that the telemedicine visit will involve communicating with the Practitioner through live audiovisual communication technology and the disclosure of certain medical information by electronic transmission. I acknowledge that I have been given the opportunity to request an in-person assessment or other available alternative prior to the telemedicine visit and am voluntarily participating in the telemedicine visit.  I understand that I have the right to withhold or withdraw my consent to the use of telemedicine in the course of my care at any time, without affecting my right to future care or treatment, and that the Practitioner or I may terminate the telemedicine visit at any time. I understand that I have the right to inspect all information obtained and/or recorded in the course of the telemedicine visit and may receive copies of available information for a reasonable fee.  I understand that some of the potential risks of receiving the Services via telemedicine include:  Delay or interruption in medical evaluation due to technological equipment failure or  disruption; Information transmitted may not be sufficient (e.g. poor resolution of images) to allow for appropriate medical decision making by the Practitioner; and/or  In rare instances, security protocols could fail, causing a breach of personal health information.  Furthermore, I acknowledge that it is my responsibility to provide information about my medical history, conditions and care that is complete and accurate to the best of my ability. I acknowledge that Practitioner's advice, recommendations, and/or decision may be based on factors not within their control, such as incomplete or inaccurate data provided by me or distortions of diagnostic images or specimens that may result from electronic transmissions. I understand that the practice of medicine is not an exact science and that Practitioner makes no warranties or guarantees regarding treatment outcomes. I acknowledge that a copy of this consent can be made available to me via my patient portal [facility] MyChart), or I can request a printed copy by calling the office of [facility].    I understand that my insurance will be billed for this visit.   I have read or had this consent read to me. I understand the contents of this consent, which adequately explains the benefits and risks of the Services being provided via telemedicine.  I have been provided ample opportunity to ask questions regarding this consent and the Services and have had my questions answered to my satisfaction. I give my informed consent for the services to be provided through the use of telemedicine in my medical care

## 2023-04-11 ENCOUNTER — Encounter: Payer: Self-pay | Admitting: Nurse Practitioner

## 2023-04-11 ENCOUNTER — Ambulatory Visit: Payer: Medicare HMO | Attending: Internal Medicine | Admitting: Nurse Practitioner

## 2023-04-11 DIAGNOSIS — Z01810 Encounter for preprocedural cardiovascular examination: Secondary | ICD-10-CM | POA: Diagnosis not present

## 2023-04-11 NOTE — Progress Notes (Signed)
Virtual Visit via Telephone Note   Because of Raymond Gordon's co-morbid illnesses, he is at least at moderate risk for complications without adequate follow up.  This format is felt to be most appropriate for this patient at this time.  The patient did not have access to video technology/had technical difficulties with video requiring transitioning to audio format only (telephone).  All issues noted in this document were discussed and addressed.  No physical exam could be performed with this format.  Please refer to the patient's chart for his consent to telehealth for [facility].  Evaluation Performed:  Preoperative cardiovascular risk assessment _____________   Date:  04/11/2023   Patient ID:  Raymond Gordon, DOB [birth year], MRN [id number] Patient Location:  Home Provider location:   Office  Primary Care Provider:  Gaspar Garbe, MD Primary Cardiologist:  None  Chief Complaint / Patient Profile   72 y.o. y/o male with a h/o diabetes, PAF on chronic anticoagulation, OSA on CPAP, HTN, HLD, prior tobacco abuse, cardiac catheterization 2012 with mild luminal irregularities, small vessels without stenosis who is pending dental extraction x 4 and presents today for telephonic preoperative cardiovascular risk assessment.  History of Present Illness    Raymond Gordon is a 72 y.o. male who presents via audio/video conferencing for a telehealth visit today.  Pt was last seen in cardiology clinic on 06/07/22 by Dr. Allyson Sabal.  At that time Raymond Gordon was doing well.  The patient is now pending procedure as outlined above. Since his last visit, he denies chest pain, fatigue, palpitations, melena, hematuria, hemoptysis, diaphoresis, weakness, presyncope, syncope, orthopnea, and PND. He reports chronic leg edema and shortness of breath that he and his sister think are stable. No orthopnea, PND, or dyspnea. Was seen by PCP within the last month and was told there were no concerns.  He is able  to achieve > 4 METS activity without concerning cardiac symptoms.  Past Medical History    Past Medical History:  Diagnosis Date   Bilateral lower extremity edema    Chronic diastolic CHF (congestive heart failure) (HCC) 08/15/2015   a. 06/2015: echo showing EF of 65-70% with Grade 2 DD   Diabetes (HCC)    INSULIN DEPENDENT   Dysrhythmia    Hyperlipidemia    Hypertension    Obesity    Obesity    Paroxysmal atrial fibrillation (HCC)    Sleep apnea    Past Surgical History:  Procedure Laterality Date   CARDIAC CATHETERIZATION     CARDIAC CATHETERIZATION N/A 2012   no large vessel occlusions    Allergies  No Known Allergies  Home Medications    Prior to Admission medications   Medication Sig Start Date End Date Taking? Authorizing Provider  allopurinol (ZYLOPRIM) 300 MG tablet Take 300 mg by mouth daily.  05/02/15   [provider]  amLODipine (NORVASC) 10 MG tablet Take 1 tablet by mouth once daily 07/03/22   Runell Gess, MD  apixaban Everlene Balls) 5 MG TABS tablet Take 1 tablet by mouth twice daily 03/03/23   Runell Gess, MD  aspirin EC 81 MG tablet Take 81 mg by mouth daily.    [provider]  atorvastatin (LIPITOR) 80 MG tablet Take 1 tablet by mouth once daily 03/18/23   Runell Gess, MD  carvedilol (COREG) 25 MG tablet TAKE ONE & ONE-HALF TABLETS BY MOUTH TWICE DAILY 03/18/16   Runell Gess, MD  doxazosin (CARDURA) 4 MG  tablet Take 4 mg by mouth daily.  11/04/13   [provider]  FARXIGA 10 MG TABS tablet  03/15/20   [provider]  finasteride (PROSCAR) 5 MG tablet Take 5 mg by mouth daily. 04/11/22   [provider]  furosemide (LASIX) 80 MG tablet Take 1 tablet (80 mg total) by mouth 2 (two) times daily. 08/22/15   Joseph Art, DO  HUMULIN R U-500 KWIKPEN 500 UNIT/ML kwikpen INJECT 80 UNITS SUBCUTANEOUSLY WITH BREAKFAST AND 60 WITH LUNCH AND 80 WITH SUPPER 12/02/18   [provider]  indomethacin  (INDOCIN) 25 MG capsule one tablet PO BID PRN for gout 11/16/15   [provider]  insulin aspart (NOVOLOG) 100 UNIT/ML injection Inject 30-60 Units into the skin 3 (three) times daily before meals.    [provider]  Insulin Pen Needle (NOVOFINE) 30G X 8 MM MISC BID use for Flexped 05/06/11   [provider]  isosorbide mononitrate (IMDUR) 30 MG 24 hr tablet Take 1 tablet (30 mg total) by mouth daily. 08/22/15   Joseph Art, DO  Lancets (ONETOUCH DELICA PLUS [place]) MISC use to self monitor blood glucose four times daily (E11.65) 12/03/18   [provider]  losartan (COZAAR) 100 MG tablet Take 100 mg by mouth daily. 10/31/18   [provider]  Multiple Vitamin (MULTIVITAMIN WITH MINERALS) TABS tablet Take 1 tablet by mouth daily.    [provider]  omeprazole (PRILOSEC OTC) 20 MG tablet Take 1 tablet (20 mg total) by mouth daily. 11/09/14   Runell Gess, MD  omeprazole (PRILOSEC) 20 MG capsule Take 20 mg by mouth daily. 10/05/18   [provider]  ONETOUCH VERIO test strip USE 1 STRIP TO CHECK GLUCOSE 4 TIMES DAILY 12/03/18   [provider]  PFIZER-BIONT COVID-19 VAC-TRIS SUSP injection  02/27/21   [provider]  PRESCRIPTION MEDICATION Inhale into the lungs at bedtime. Pt is on C Pap machine    [provider]  spironolactone (ALDACTONE) 25 MG tablet Take 25 mg by mouth daily. 04/26/22   [provider]  Sulfamethoxazole-Trimethoprim (SULFAMETHOXAZOLE-TMP DS PO) Take by mouth 2 (two) times daily.    [provider]  valsartan (DIOVAN) 160 MG tablet Take 1 tablet (160 mg total) by mouth daily. 02/28/16   Runell Gess, MD  Zoster Vaccine Adjuvanted [facility]) injection Inject series of 2 injections as directed 08/20/18   [provider]    Physical Exam    Vital Signs:  Raymond Gordon does not have vital signs available for review today.  Given telephonic nature of  communication, physical exam is limited. AAOx3. NAD. Normal affect.  Speech and respirations are unlabored.  Accessory Clinical Findings    None  Assessment & Plan    1.  Preoperative Cardiovascular Risk Assessment: According to the Revised Cardiac Risk Index (RCRI), his Perioperative Risk of Major Cardiac Event is (%): 6.6. His Functional Capacity in METs is: 5.07 according to the [facility] Activity Status Index (DASI). The patient is doing well from a cardiac perspective. Therefore, based on ACC/AHA guidelines, the patient would be at acceptable risk for the planned procedure without further cardiovascular testing.   The patient was advised that if he develops new symptoms prior to surgery to contact our office to arrange for a follow-up visit, and he verbalized understanding.  Per office protocol, patient can hold Eliquis for 1 day prior to procedure.   A copy of this note will be  routed to requesting surgeon.  Time:   Today, I have spent 10 minutes with the patient with telehealth technology discussing medical history, symptoms, and management plan.    Levi Aland, NP-C  04/11/2023, 10:19 AM [address], [address] Office [phone] Fax [phone]

## 2023-05-02 DIAGNOSIS — M109 Gout, unspecified: Secondary | ICD-10-CM | POA: Diagnosis not present

## 2023-05-02 DIAGNOSIS — E7800 Pure hypercholesterolemia, unspecified: Secondary | ICD-10-CM | POA: Diagnosis not present

## 2023-05-02 DIAGNOSIS — E1151 Type 2 diabetes mellitus with diabetic peripheral angiopathy without gangrene: Secondary | ICD-10-CM | POA: Diagnosis not present

## 2023-05-02 DIAGNOSIS — Z125 Encounter for screening for malignant neoplasm of prostate: Secondary | ICD-10-CM | POA: Diagnosis not present

## 2023-05-02 DIAGNOSIS — I110 Hypertensive heart disease with heart failure: Secondary | ICD-10-CM | POA: Diagnosis not present

## 2023-05-09 DIAGNOSIS — Z0000 Encounter for general adult medical examination without abnormal findings: Secondary | ICD-10-CM | POA: Diagnosis not present

## 2023-05-09 DIAGNOSIS — R82998 Other abnormal findings in urine: Secondary | ICD-10-CM | POA: Diagnosis not present

## 2023-05-09 DIAGNOSIS — Z794 Long term (current) use of insulin: Secondary | ICD-10-CM | POA: Diagnosis not present

## 2023-05-09 DIAGNOSIS — Z1331 Encounter for screening for depression: Secondary | ICD-10-CM | POA: Diagnosis not present

## 2023-05-09 DIAGNOSIS — I480 Paroxysmal atrial fibrillation: Secondary | ICD-10-CM | POA: Diagnosis not present

## 2023-05-09 DIAGNOSIS — E1151 Type 2 diabetes mellitus with diabetic peripheral angiopathy without gangrene: Secondary | ICD-10-CM | POA: Diagnosis not present

## 2023-05-09 DIAGNOSIS — Z7901 Long term (current) use of anticoagulants: Secondary | ICD-10-CM | POA: Diagnosis not present

## 2023-05-09 DIAGNOSIS — I110 Hypertensive heart disease with heart failure: Secondary | ICD-10-CM | POA: Diagnosis not present

## 2023-05-09 DIAGNOSIS — D6869 Other thrombophilia: Secondary | ICD-10-CM | POA: Diagnosis not present

## 2023-05-09 DIAGNOSIS — Z1339 Encounter for screening examination for other mental health and behavioral disorders: Secondary | ICD-10-CM | POA: Diagnosis not present

## 2023-05-09 DIAGNOSIS — I5030 Unspecified diastolic (congestive) heart failure: Secondary | ICD-10-CM | POA: Diagnosis not present

## 2023-05-31 IMAGING — MR MR PROSTATE WO/W CM
12 series · 48 of 48 positions shown · IV contrast (multihance)
Comparison: None.

CLINICAL DATA: 70-year-old male with elevated PSA equal 9.64.

EXAM:
MR PROSTATE WITHOUT AND WITH CONTRAST
TECHNIQUE: Multiplanar multisequence MRI images were obtained of the pelvis
centered about the prostate. Pre and post contrast images were
obtained.
CONTRAST:  20mL MULTIHANCE GADOBENATE DIMEGLUMINE 529 MG/ML IV SOLN

[Series 4: T2 · coronal · 3.0mm · 0.56mm/px · 1 of 27 slices shown (1 of 3)]
[im 1/27]
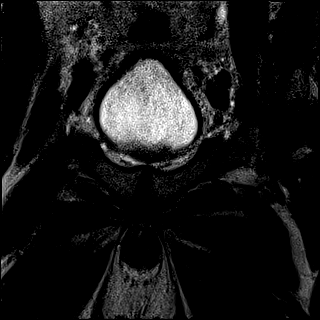

[Series 5: T1 · axial · 5.0mm · 1.25mm/px · 1 of 80 slices shown]
[im 1/80]
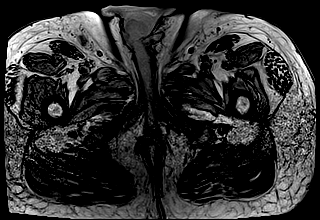

[Series 6: DWI · axial · 3.0mm · 1.75mm/px · 1 of 96 slices shown (1 of 3)]
[im 1/96]
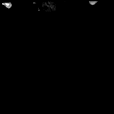

[Series 7: DWI · axial · 3.0mm · 1.75mm/px · 1 of 32 slices shown (2 of 3)]
[im 1/32]
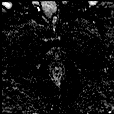

[Series 8: DWI · axial · 3.0mm · 1.75mm/px · 1 of 32 slices shown (3 of 3)]
[im 1/32]
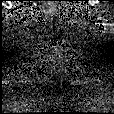

[Series 9: T2 · axial · 3.0mm · 0.56mm/px · 1 of 30 slices shown (2 of 3)]
[im 1/30]
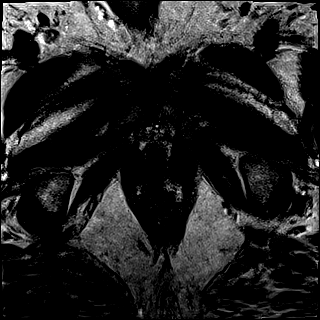

[Series 10: T2 · axial · 1.0mm · 1.04mm/px · z∈[+69,+156]mm · 2 of 88 slices shown (3 of 3)]
[im 1/88]
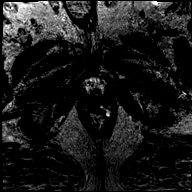
[im 88/88]
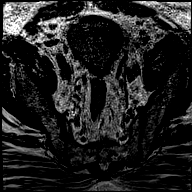

[Series 11: pre t1_twist_tra_dyn · axial · non-contrast · 3.5mm · 0.83mm/px · 1 of 26 slices shown]
[im 1/26]
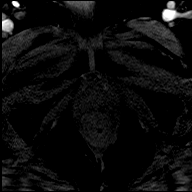

[Series 12: post t1_twist_tra_dyn-copy center · axial · non-contrast · 3.5mm · 0.83mm/px · z∈[+69,+157]mm · 18 of 780 slices shown]
[im 1/780]
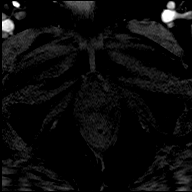
[im 46/780]
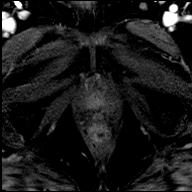
[im 92/780]
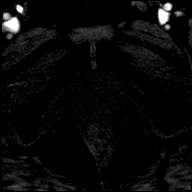
[im 138/780]
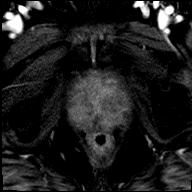
[im 184/780]
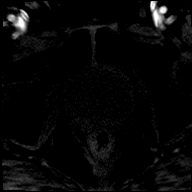
[im 230/780]
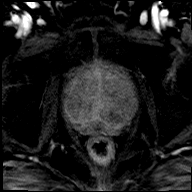
[im 275/780]
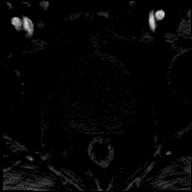
[im 321/780]
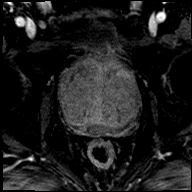
[im 367/780]
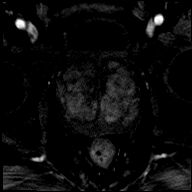
[im 413/780]
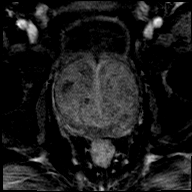
[im 459/780]
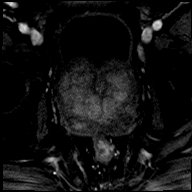
[im 505/780]
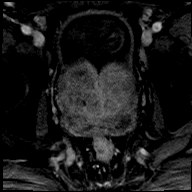
[im 550/780]
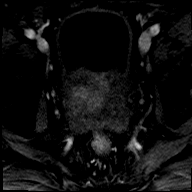
[im 596/780]
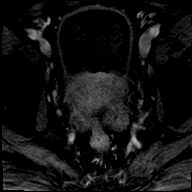
[im 642/780]
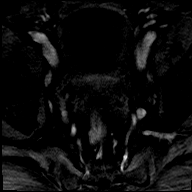
[im 688/780]
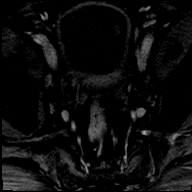
[im 734/780]
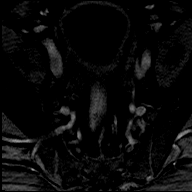
[im 780/780]
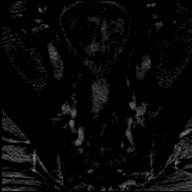

[Series 13: post t1_twist_tra_dyn-copy cent_sub · axial · 3.5mm · 0.83mm/px · z∈[+69,+157]mm · 17 of 754 slices shown]
[im 1/754]
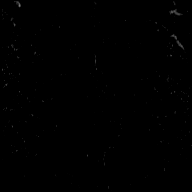
[im 48/754]
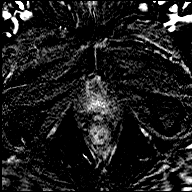
[im 95/754]
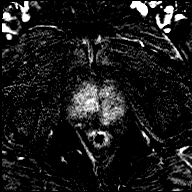
[im 142/754]
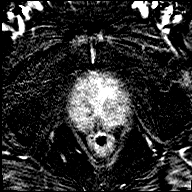
[im 189/754]
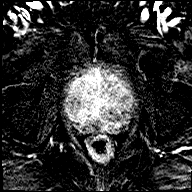
[im 236/754]
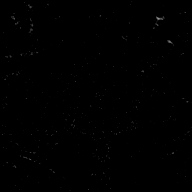
[im 283/754]
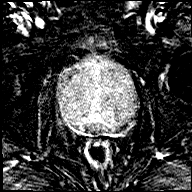
[im 330/754]
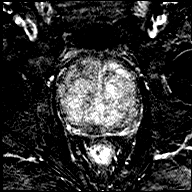
[im 377/754]
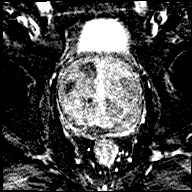
[im 424/754]
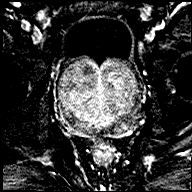
[im 471/754]
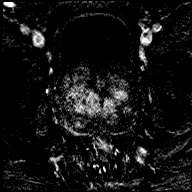
[im 518/754]
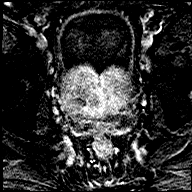
[im 565/754]
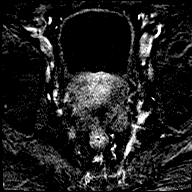
[im 612/754]
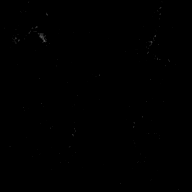
[im 659/754]
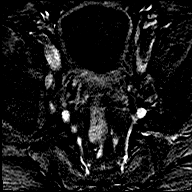
[im 706/754]
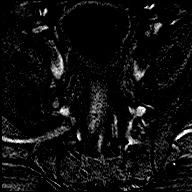
[im 754/754]
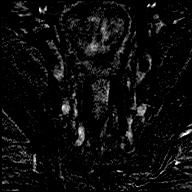

[Series 14: t1_vibe_dixon_tra_f · axial · 2.5mm · 0.91mm/px · z∈[+43,+240]mm · 2 of 80 slices shown]
[im 1/80]
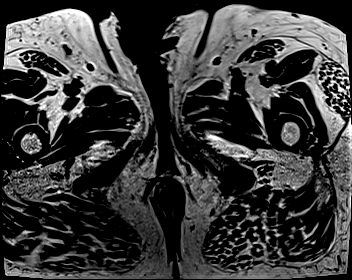
[im 80/80]
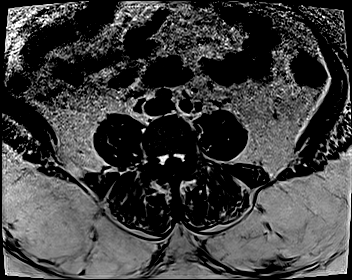

[Series 15: t1_vibe_dixon_tra_w · axial · 2.5mm · 0.91mm/px · z∈[+43,+240]mm · 2 of 80 slices shown]
[im 1/80]
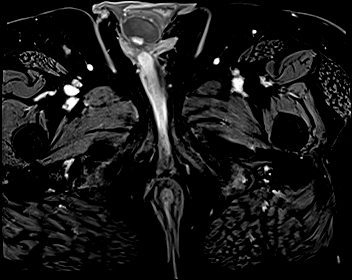
[im 80/80]
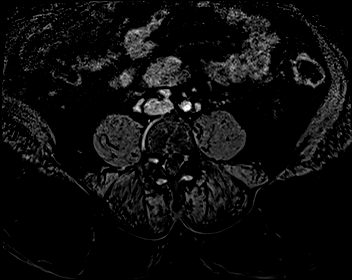

[48 of 48 positions shown; findings below may reference images not displayed]

FINDINGS: Prostate: No foci of restricted diffusion within the peripheral zone
(series 6 and series 7). The peripheral zone is thinned by the
enlarged transitional zone. The thinned transitional zone has no
focal lesion on T2 weighted imaging. There is heterogeneous signal
intensity which may relate to prior biopsy or inflammation.

The transitional zone is enlarged by capsulated nodules. No
suspicious imaging characteristics on T2 weighted imaging.

No suspicious postcontrast enhancement.

The Seminal vesicles are small.  Prostatic capsule is intact.

Volume: 6.7 x 7.0 x 7.1 cm.  Volume calculation equal 173 cc

Transcapsular spread:  Absent

Seminal vesicle involvement: Absent

Neurovascular bundle involvement: Absent

Pelvic adenopathy: Absent

Bone metastasis: Absent

Other findings: None
IMPRESSION: 1. No high-grade carcinoma identified in the peripheral zone.
Heterogeneous signal intensity the peripheral zone may relate to
prior biopsy or inflammation. PI-RADS: 2
2. Enlarged nodular transitional zone most consistent with benign
prostate hypertrophy. PI-RADS: 2

## 2023-06-04 ENCOUNTER — Ambulatory Visit: Payer: Medicare HMO | Attending: Nurse Practitioner | Admitting: Nurse Practitioner

## 2023-06-04 ENCOUNTER — Encounter: Payer: Self-pay | Admitting: Nurse Practitioner

## 2023-06-04 VITALS — BP 110/58 | HR 87 | Ht 64.0 in | Wt 277.4 lb

## 2023-06-04 DIAGNOSIS — E782 Mixed hyperlipidemia: Secondary | ICD-10-CM | POA: Diagnosis not present

## 2023-06-04 DIAGNOSIS — G4733 Obstructive sleep apnea (adult) (pediatric): Secondary | ICD-10-CM | POA: Diagnosis not present

## 2023-06-04 DIAGNOSIS — I480 Paroxysmal atrial fibrillation: Secondary | ICD-10-CM

## 2023-06-04 DIAGNOSIS — Z794 Long term (current) use of insulin: Secondary | ICD-10-CM | POA: Diagnosis not present

## 2023-06-04 DIAGNOSIS — E118 Type 2 diabetes mellitus with unspecified complications: Secondary | ICD-10-CM

## 2023-06-04 DIAGNOSIS — I10 Essential (primary) hypertension: Secondary | ICD-10-CM | POA: Diagnosis not present

## 2023-06-04 DIAGNOSIS — I5032 Chronic diastolic (congestive) heart failure: Secondary | ICD-10-CM

## 2023-06-04 DIAGNOSIS — R600 Localized edema: Secondary | ICD-10-CM

## 2023-06-04 NOTE — Progress Notes (Signed)
Office Visit    Patient Name: Raymond Gordon Date of Encounter: 06/04/2023  Primary Care Provider:  Gaspar Garbe, MD Primary Cardiologist:  Nanetta Batty, MD  Chief Complaint    72 year old male with a history of paroxysmal atrial fibrillation, chronic diastolic heart failure, hypertension, hyperlipidemia, type 2 diabetes, and OSA who presents for follow-up related to atrial fibrillation.  Past Medical History    Past Medical History:  Diagnosis Date   Bilateral lower extremity edema    Chronic diastolic CHF (congestive heart failure) (HCC) 08/15/2015   a. 06/2015: echo showing EF of 65-70% with Grade 2 DD   Diabetes (HCC)    INSULIN DEPENDENT   Dysrhythmia    Hyperlipidemia    Hypertension    Obesity    Obesity    Paroxysmal atrial fibrillation (HCC)    Sleep apnea    Past Surgical History:  Procedure Laterality Date   CARDIAC CATHETERIZATION     CARDIAC CATHETERIZATION N/A 2012   no large vessel occlusions    Allergies  No Known Allergies   Labs/Other Studies Reviewed    The following studies were reviewed today:  Cardiac Studies & Procedures       ECHOCARDIOGRAM  ECHOCARDIOGRAM COMPLETE 07/14/2015            Recent Labs: 03/27/2023: BUN 16; Creatinine, Ser 0.96; Hemoglobin 12.3; Platelets 319; Potassium 3.9; Sodium 147  Recent Lipid Panel    Component Value Date/Time   CHOL 229 (H) 07/18/2011 0345   TRIG 101 07/18/2011 0345   HDL 46 07/18/2011 0345   CHOLHDL 5.0 07/18/2011 0345   VLDL 20 07/18/2011 0345   LDLCALC 163 (H) 07/18/2011 0345    History of Present Illness   72 year old male with the above past medical history including paroxysmal atrial fibrillation, chronic diastolic heart failure, hypertension, hyperlipidemia, type 2 diabetes, and OSA.  He was hospitalized in July 15, 2011 in the setting of chest pain, new onset atrial fibrillation. He underwent cardiac catheterization which revealed minimal luminal irregularities, small  vessel disease, normal LV function, he spontaneously converted to sinus rhythm, started on anticoagulation.  He was hospitalized in August 2016 with recurrent atrial fibrillation with RVR, chest pain.  Amlodipine was transitioned to diltiazem.  Echocardiogram at the time showed EF 65 to 70%, no RWMA, TTE, mildly to moderately dilated left atrium, no significant valvular abnormalities. He was hospitalized in September 2016 in the setting of acute on chronic diastolic heart failure, improved with IV diuresis.  He was last seen in office on 06/07/2022 and was doing well from a cardiac standpoint.  He denied any breakthrough atrial fibrillation, denied symptoms concerning for angina.  He was seen virtually on 04/11/2023 in the setting of preoperative cardiac evaluation and was doing well.  He presents today for follow-up accompanied by his sister.  Since his last visit he has well from a cardiac standpoint.  He has stable chronic bilateral lower extremity edema, he denies any chest pain, palpitations, dyspnea, PND, orthopnea.  He has gained about 10 pounds over the past year.  Overall, he reports feeling well.  Home Medications    Current Outpatient Medications  Medication Sig Dispense Refill   allopurinol (ZYLOPRIM) 300 MG tablet Take 300 mg by mouth daily.      amLODipine (NORVASC) 10 MG tablet Take 1 tablet by mouth once daily 90 tablet 3   apixaban (ELIQUIS) 5 MG TABS tablet Take 1 tablet by mouth twice daily 180 tablet 1   aspirin EC  81 MG tablet Take 81 mg by mouth daily.     atorvastatin (LIPITOR) 80 MG tablet Take 1 tablet by mouth once daily 90 tablet 0   carvedilol (COREG) 25 MG tablet TAKE ONE & ONE-HALF TABLETS BY MOUTH TWICE DAILY 90 tablet 3   doxazosin (CARDURA) 4 MG tablet Take 4 mg by mouth daily.      FARXIGA 10 MG TABS tablet      finasteride (PROSCAR) 5 MG tablet Take 5 mg by mouth daily.     furosemide (LASIX) 80 MG tablet Take 1 tablet (80 mg total) by mouth 2 (two) times daily. 60  tablet 0   HUMULIN R U-500 KWIKPEN 500 UNIT/ML kwikpen INJECT 80 UNITS SUBCUTANEOUSLY WITH BREAKFAST AND 60 WITH LUNCH AND 80 WITH SUPPER     indomethacin (INDOCIN) 25 MG capsule one tablet PO BID PRN for gout     insulin aspart (NOVOLOG) 100 UNIT/ML injection Inject 30-60 Units into the skin 3 (three) times daily before meals.     Insulin Pen Needle (NOVOFINE) 30G X 8 MM MISC BID use for Flexped     isosorbide mononitrate (IMDUR) 30 MG 24 hr tablet Take 1 tablet (30 mg total) by mouth daily.     Lancets (ONETOUCH DELICA PLUS LANCET33G) MISC use to self monitor blood glucose four times daily (E11.65)     losartan (COZAAR) 100 MG tablet Take 100 mg by mouth daily.     Multiple Vitamin (MULTIVITAMIN WITH MINERALS) TABS tablet Take 1 tablet by mouth daily.     omeprazole (PRILOSEC) 20 MG capsule Take 20 mg by mouth daily.     ONETOUCH VERIO test strip USE 1 STRIP TO CHECK GLUCOSE 4 TIMES DAILY     PFIZER-BIONT COVID-19 VAC-TRIS SUSP injection      PRESCRIPTION MEDICATION Inhale into the lungs at bedtime. Pt is on C Pap machine     spironolactone (ALDACTONE) 25 MG tablet Take 25 mg by mouth daily.     Sulfamethoxazole-Trimethoprim (SULFAMETHOXAZOLE-TMP DS PO) Take by mouth 2 (two) times daily.     valsartan (DIOVAN) 160 MG tablet Take 1 tablet (160 mg total) by mouth daily. 90 tablet 3   Zoster Vaccine Adjuvanted [facility]) injection Inject series of 2 injections as directed     omeprazole (PRILOSEC OTC) 20 MG tablet Take 1 tablet (20 mg total) by mouth daily. (Patient not taking: Reported on 06/04/2023)     No current facility-administered medications for this visit.     Review of Systems    He denies chest pain, palpitations, dyspnea, pnd, orthopnea, n, v, dizziness, syncope, weight gain, or early satiety. All other systems reviewed and are otherwise negative except as noted above.   Physical Exam    VS:  BP (!) 110/58   Pulse 87   Ht 5\' 4"  (1.626 m)   Wt 277 lb 6.4 oz (125.8 kg)   SpO2  97%   BMI 47.62 kg/m   GEN: Well nourished, well developed, in no acute distress. HEENT: normal. Neck: Supple, no JVD, carotid bruits, or masses. Cardiac: RRR, no murmurs, rubs, or gallops. No clubbing, cyanosis, non pitting bilateral lower extrmeity edema.  Radials/DP/PT 2+ and equal bilaterally.  Respiratory:  Respirations regular and unlabored, clear to auscultation bilaterally. GI: Soft, nontender, nondistended, BS + x 4. MS: no deformity or atrophy. Skin: warm and dry, no rash. Neuro:  Strength and sensation are intact. Psych: Normal affect.  Accessory Clinical Findings    ECG personally reviewed by me today -  EKG Interpretation Date/Time:  Wednesday June 04 2023 13:44:51 EDT Ventricular Rate:  87 PR Interval:  120 QRS Duration:  104 QT Interval:  394 QTC Calculation: 474 R Axis:   12  Text Interpretation: Sinus rhythm with Premature supraventricular complexes When compared with ECG of 13-Aug-2015 19:19, PREVIOUS ECG IS PRESENT Confirmed by Bernadene Person ([id number]) on 06/04/2023 1:47:02 PM  - no acute changes.   Lab Results  Component Value Date   WBC 5.7 03/27/2023   HGB 12.3 (L) 03/27/2023   HCT 37.6 03/27/2023   MCV 89 03/27/2023   PLT 319 03/27/2023   Lab Results  Component Value Date   CREATININE 0.96 03/27/2023   BUN 16 03/27/2023   NA 147 (H) 03/27/2023   K 3.9 03/27/2023   CL 107 (H) 03/27/2023   CO2 22 03/27/2023   Lab Results  Component Value Date   ALT 13 11/02/2021   AST 13 11/02/2021   ALKPHOS 98 11/02/2021   BILITOT 0.3 11/02/2021   Lab Results  Component Value Date   CHOL 229 (H) 07/18/2011   HDL 46 07/18/2011   LDLCALC 163 (H) 07/18/2011   TRIG 101 07/18/2011   CHOLHDL 5.0 07/18/2011    Lab Results  Component Value Date   HGBA1C 8.7 (H) 08/14/2015    Assessment & Plan    1. Paroxysmal atrial fibrillation: Maintaining NSR.  Denies palpitations.  Continue carvedilol, Eliquis.  2. Chronic diastolic heart failure: Most recent echo in  2016 showed EF 65 to 70%, no RWMA, TTE, mildly to moderately dilated left atrium, no significant valvular abnormalities.  He has significant chronic bilateral lower extremity edema, reportedly unchanged from prior visits.  Suspect component of lymphedema.  He denies dyspnea, PND, orthopnea, weight gain.  Will check BNP, BMET.  If BNP elevated, could consider transitioning from Lasix to torsemide, consider repeat echo.  It appears though that his symptoms are stable.  Continue carvedilol, Farxiga, ARB (both valsartan and losartan are listed on his medication list, he is uncertain which one he is taking but is certain he is not taking both), Lasix.  3. Hypertension: BP well controlled. Continue current antihypertensive regimen.   4. Hyperlipidemia: LDL was 96 in 03/2022.  Will request most recent labs from PCP.  Continue Lipitor.  5. Type 2 diabetes: A1c was 8.8 in 11/2022.  Monitored and managed per PCP.  6. OSA: Adherent to CPAP.   7. Disposition: Follow-up in 1 year, sooner if needed.      Joylene Grapes, NP 06/04/2023, 2:14 PM

## 2023-06-04 NOTE — Patient Instructions (Addendum)
Medication Instructions:  Your physician recommends that you continue on your current medications as directed. Please refer to the Current Medication list given to you today.  *If you need a refill on your cardiac medications before your next appointment, please call your pharmacy*   Lab Work: BNP & BMET today   Testing/Procedures: NONE ordered at this time of appointment     Follow-Up: At [facility], you and your health needs are our priority.  As part of our continuing mission to provide you with exceptional heart care, we have created designated Provider Care Teams.  These Care Teams include your primary Cardiologist (physician) and Advanced Practice Providers (APPs -  Physician Assistants and Nurse Practitioners) who all work together to provide you with the care you need, when you need it.  We recommend signing up for the patient portal called "MyChart".  Sign up information is provided on this After Visit Summary.  MyChart is used to connect with patients for Virtual Visits (Telemedicine).  Patients are able to view lab/test results, encounter notes, upcoming appointments, etc.  Non-urgent messages can be sent to your provider as well.   To learn more about what you can do with MyChart, go to [email or web address].    Your next appointment:   1 year(s)  Provider:   Nanetta Batty, MD

## 2023-06-05 ENCOUNTER — Telehealth: Payer: Self-pay | Admitting: Cardiovascular Disease

## 2023-06-05 LAB — BASIC METABOLIC PANEL
BUN/Creatinine Ratio: 21 (ref 10–24)
BUN: 21 mg/dL (ref 8–27)
CO2: 21 mmol/L (ref 20–29)
Calcium: 9.5 mg/dL (ref 8.6–10.2)
Chloride: 105 mmol/L (ref 96–106)
Creatinine, Ser: 0.98 mg/dL (ref 0.76–1.27)
Glucose: 100 mg/dL — ABNORMAL HIGH (ref 70–99)
Potassium: 3.8 mmol/L (ref 3.5–5.2)
Sodium: 144 mmol/L (ref 134–144)
eGFR: 81 mL/min/{1.73_m2} (ref >59)

## 2023-06-05 LAB — BRAIN NATRIURETIC PEPTIDE: BNP: 108.5 pg/mL — ABNORMAL HIGH (ref 0.0–100.0)

## 2023-06-05 NOTE — Telephone Encounter (Signed)
Sister, is calling back to say that the patient is not taken losartan (COZAAR) 100 MG tablet . Please advise

## 2023-06-05 NOTE — Telephone Encounter (Signed)
Clarified with patient sister:  Patient IS taking Losartan 100 mg Daily  NOT taking valsartan (160 mg tablet). Discontinued on med list I any changes or recommendations please advise.  Otherwise she does not need a call back.

## 2023-06-09 ENCOUNTER — Telehealth: Payer: Self-pay

## 2023-06-09 NOTE — Telephone Encounter (Signed)
Spoke with pts sister. Lab results were discussed. Pt will continue current mediation and f/u as planned.

## 2023-06-17 ENCOUNTER — Other Ambulatory Visit: Payer: Self-pay | Admitting: Cardiovascular Disease

## 2023-06-30 ENCOUNTER — Other Ambulatory Visit: Payer: Self-pay | Admitting: Cardiovascular Disease

## 2023-07-15 ENCOUNTER — Ambulatory Visit: Payer: Medicare HMO | Admitting: Podiatry

## 2023-07-15 ENCOUNTER — Encounter: Payer: Self-pay | Admitting: Podiatry

## 2023-07-15 DIAGNOSIS — M79674 Pain in right toe(s): Secondary | ICD-10-CM | POA: Diagnosis not present

## 2023-07-15 DIAGNOSIS — B351 Tinea unguium: Secondary | ICD-10-CM

## 2023-07-15 DIAGNOSIS — I890 Lymphedema, not elsewhere classified: Secondary | ICD-10-CM

## 2023-07-15 DIAGNOSIS — E1151 Type 2 diabetes mellitus with diabetic peripheral angiopathy without gangrene: Secondary | ICD-10-CM | POA: Diagnosis not present

## 2023-07-15 DIAGNOSIS — M79675 Pain in left toe(s): Secondary | ICD-10-CM

## 2023-07-15 DIAGNOSIS — R460 Very low level of personal hygiene: Secondary | ICD-10-CM | POA: Diagnosis not present

## 2023-07-20 NOTE — Progress Notes (Signed)
  Subjective:  Patient ID: Raymond Gordon, male    DOB: 06-04-1951,  MRN: [id number]  Raymond Gordon presents to clinic today for at risk foot care. Pt has h/Raymond NIDDM with PAD and painful elongated mycotic toenails 1-5 bilaterally which are tender when wearing enclosed shoe gear. Pain is relieved with periodic professional debridement. He is accompanied by his sister on today's visit. Mr. Schlotterbeck has not been compliant with his pedal hygiene practices as prescribed on previous visits. Chief Complaint  Patient presents with   Diabetes    [facility] BS - 206 A1C - 7.4 LVPCP - 03/13/23 BLOOD THINNER   New problem(s): None.   PCP is [name], Adelfa Koh, MD.  No Known Allergies  Review of Systems: Negative except as noted in the HPI.  Objective:  There were no vitals filed for this visit. Raymond Gordon is a pleasant 72 y.Raymond. male morbidly obese in NAD. AAO x 3.  Vascular Examination: CFT <3 seconds b/l LE. Faintly palpable DP pulses b/l LE. Faintly palpable PT pulse(s) b/l LE. Pedal hair absent. No pain with calf compression b/l. Lower extremity skin temperature gradient within normal limits. Lymphedema present BLE with hyperpigmented thick skin plaques. There is a deep skin fissure on the anterior aspect of the lower leg, but no open wound. No ischemia or gangrene noted b/l LE. No cyanosis or clubbing noted b/l LE.  Dermatological Examination: Pedal skin is warm and supple b/l LE. No open wounds b/l LE. No interdigital macerations noted b/l LE. Toenails 1-5 bilaterally elongated, discolored, dystrophic, thickened, and crumbly with subungual debris and tenderness to dorsal palpation.   Hyperkeratotic lesion(s) submet head 5 right foot.  No erythema, no edema, no drainage, no fluctuance.  Dried heme noted lateral border right 3rd digit. Incurvated nail lateral border. No erythema, no edema, no drainage, no fluctuance.  Pedal skin noted to exhibit signs of poor pedal hygiene with noted foot odor b/l and  interdigital debris. No open wounds noted.  Neurological Examination: Protective sensation intact 5/5 intact bilaterally with 10g monofilament b/l.  Musculoskeletal Examination: Muscle strength 5/5 to all lower extremity muscle groups bilaterally. Pes planus deformity noted bilateral LE. Utilizes cane for ambulation assistance.  Assessment/Plan: 1. Pain due to onychomycosis of toenails of both feet   2. Lymphedema   3. Poor hygiene   4. Type II diabetes mellitus with peripheral circulatory disorder [facility])     -Patient's family member present. All questions/concerns addressed on today's visit. -Mr. Fichera lives alone. Mr. Picton, his sister and myself discussed need for him to be compliant with pedal hygiene practices. Recommended once weekly foot soaks with Dial antibacterial soap or Hibiclens Skin Cleanser. His sister will discuss with their other sibling. Mr. Winkel is aware he must . -Patient to continue soft, supportive shoe gear daily. -Toenails 1-5 b/l were debrided in length and girth with sterile nail nippers without iatrogenic bleeding.  -Patient/POA to call should there be question/concern in the interim.   Return in about 3 months (around 10/15/2023).  [name], DPM

## 2023-08-13 DIAGNOSIS — Z23 Encounter for immunization: Secondary | ICD-10-CM | POA: Diagnosis not present

## 2023-08-13 DIAGNOSIS — L850 Acquired ichthyosis: Secondary | ICD-10-CM | POA: Diagnosis not present

## 2023-08-13 DIAGNOSIS — E1151 Type 2 diabetes mellitus with diabetic peripheral angiopathy without gangrene: Secondary | ICD-10-CM | POA: Diagnosis not present

## 2023-08-13 DIAGNOSIS — E113393 Type 2 diabetes mellitus with moderate nonproliferative diabetic retinopathy without macular edema, bilateral: Secondary | ICD-10-CM | POA: Diagnosis not present

## 2023-08-13 DIAGNOSIS — E1165 Type 2 diabetes mellitus with hyperglycemia: Secondary | ICD-10-CM | POA: Diagnosis not present

## 2023-08-13 DIAGNOSIS — I5030 Unspecified diastolic (congestive) heart failure: Secondary | ICD-10-CM | POA: Diagnosis not present

## 2023-08-13 DIAGNOSIS — I110 Hypertensive heart disease with heart failure: Secondary | ICD-10-CM | POA: Diagnosis not present

## 2023-08-13 DIAGNOSIS — Z794 Long term (current) use of insulin: Secondary | ICD-10-CM | POA: Diagnosis not present

## 2023-09-19 ENCOUNTER — Other Ambulatory Visit: Payer: Self-pay | Admitting: Cardiovascular Disease

## 2023-09-19 DIAGNOSIS — I480 Paroxysmal atrial fibrillation: Secondary | ICD-10-CM

## 2023-09-19 NOTE — Telephone Encounter (Signed)
Prescription refill request for Eliquis received. Indication:afib Last office visit:7/24 Scr:0.98  7/24 Age: 72 Weight:125.8  kg  Prescription refilled

## 2023-10-15 ENCOUNTER — Ambulatory Visit: Payer: Medicare HMO | Admitting: Podiatry

## 2023-10-16 ENCOUNTER — Ambulatory Visit: Payer: Medicare HMO | Admitting: Podiatry

## 2023-10-16 ENCOUNTER — Encounter: Payer: Self-pay | Admitting: Podiatry

## 2023-10-16 DIAGNOSIS — I890 Lymphedema, not elsewhere classified: Secondary | ICD-10-CM

## 2023-10-16 DIAGNOSIS — M79674 Pain in right toe(s): Secondary | ICD-10-CM

## 2023-10-16 DIAGNOSIS — B351 Tinea unguium: Secondary | ICD-10-CM | POA: Diagnosis not present

## 2023-10-16 DIAGNOSIS — R262 Difficulty in walking, not elsewhere classified: Secondary | ICD-10-CM | POA: Diagnosis not present

## 2023-10-16 DIAGNOSIS — E1151 Type 2 diabetes mellitus with diabetic peripheral angiopathy without gangrene: Secondary | ICD-10-CM

## 2023-10-16 DIAGNOSIS — M79675 Pain in left toe(s): Secondary | ICD-10-CM | POA: Diagnosis not present

## 2023-10-22 NOTE — Progress Notes (Signed)
Subjective:  Patient ID: Raymond Gordon, male    DOB: 1951/06/29,  MRN: [id number]  MAUI BOLOTIN presents to clinic today for at risk foot care. Pt has h/o NIDDM with PAD and callus(es) right foot and painful thick toenails that are difficult to trim. Painful toenails interfere with ambulation. Aggravating factors include wearing enclosed shoe gear. Pain is relieved with periodic professional debridement. Painful calluses are aggravated when weightbearing with and without shoegear. Pain is relieved with periodic professional debridement.   He is accompanied by his sister on today's visit. Patient has not been consistently applying moisturizer to feet as recommended.  He has chronic lymphedema and have never had any formal therapy to control it.            PCP is [name], Adelfa Koh, MD.  No Known Allergies  Review of Systems: Negative except as noted in the HPI.  Objective: No changes noted in today's physical examination. There were no vitals filed for this visit. Raymond Gordon is a pleasant 72 y.o. male morbidly obese in NAD. AAO x 3.  Vascular Examination: CFT <3 seconds b/l LE. Faintly palpable DP pulses b/l LE. Faintly palpable PT pulse(s) b/l LE. Pedal hair absent. No pain with calf compression b/l. Lower extremity skin temperature gradient within normal limits.   Lymphedema present BLE with hyperpigmented thick skin plaques. There is a deep skin fissure on the anterior aspect of the lower leg, but no open wound. No ischemia or gangrene noted b/l LE. No cyanosis or clubbing noted b/l LE.  Dermatological Examination: Pedal skin is warm and supple b/l LE. No open wounds b/l LE. No interdigital macerations noted b/l LE. Toenails 1-5 bilaterally elongated, discolored, dystrophic, thickened, and crumbly with subungual debris and tenderness to dorsal palpation.   Hyperkeratotic lesion(s) submet head 5 right foot.  No erythema, no edema, no drainage, no fluctuance.  Dried heme  noted lateral border right 3rd digit. Incurvated nail lateral border. No erythema, no edema, no drainage, no fluctuance.  Pedal skin noted to exhibit signs of poor pedal hygiene with noted foot odor b/l and interdigital debris. No open wounds noted.  Neurological Examination: Protective sensation intact 5/5 intact bilaterally with 10g monofilament b/l.  Musculoskeletal Examination: Muscle strength 5/5 to all lower extremity muscle groups bilaterally. Pes planus deformity noted bilateral LE. Utilizes cane for ambulation assistance.  Assessment/Plan: 1. Pain due to onychomycosis of toenails of both feet   2. Lymphedema   3. Difficulty walking   4. Type II diabetes mellitus with peripheral circulatory disorder (HCC)     No orders of the defined types were placed in this encounter.   AMB REFERRAL TO OCCUPATIONAL THERAPY Patient was evaluated and treated. All patient's and/or POA's questions/concerns addressed on today's visit. Toenails 1-5 debrided in length and girth without incident. Continue soft, supportive shoe gear daily. Report any pedal injuries to medical professional. Call office if there are any questions/concerns. -Referral to Occupational Therapy for lymphedema b/l LE. -Encouraged patient to practice pedal hygiene. -Patient/POA to call should there be question/concern in the interim.   Return in about 3 months (around 01/16/2024).  [name], DPM      [place] LOCATION: [address]                                                 [place],  [place] [address]                   Office [phone]   [facility] LOCATION: [address] [place], [name] [address] Office [phone]

## 2023-11-15 ENCOUNTER — Other Ambulatory Visit: Payer: Self-pay | Admitting: Cardiovascular Disease

## 2023-11-24 ENCOUNTER — Other Ambulatory Visit (HOSPITAL_COMMUNITY): Payer: Self-pay | Admitting: Registered Nurse

## 2023-11-24 ENCOUNTER — Ambulatory Visit (HOSPITAL_COMMUNITY)
Admission: RE | Admit: 2023-11-24 | Discharge: 2023-11-24 | Disposition: A | Discharge status: Home / Self Care | Payer: Medicare HMO | Source: Ambulatory Visit | Attending: Surgery | Admitting: Surgery

## 2023-11-24 DIAGNOSIS — I480 Paroxysmal atrial fibrillation: Secondary | ICD-10-CM | POA: Diagnosis not present

## 2023-11-24 DIAGNOSIS — M79602 Pain in left arm: Secondary | ICD-10-CM | POA: Diagnosis not present

## 2023-11-24 DIAGNOSIS — Z7901 Long term (current) use of anticoagulants: Secondary | ICD-10-CM | POA: Diagnosis not present

## 2023-11-24 DIAGNOSIS — M109 Gout, unspecified: Secondary | ICD-10-CM | POA: Diagnosis not present

## 2023-11-24 DIAGNOSIS — E7800 Pure hypercholesterolemia, unspecified: Secondary | ICD-10-CM | POA: Diagnosis not present

## 2023-11-24 DIAGNOSIS — M7989 Other specified soft tissue disorders: Secondary | ICD-10-CM

## 2023-11-24 DIAGNOSIS — E1151 Type 2 diabetes mellitus with diabetic peripheral angiopathy without gangrene: Secondary | ICD-10-CM | POA: Diagnosis not present

## 2023-11-24 DIAGNOSIS — Z0189 Encounter for other specified special examinations: Secondary | ICD-10-CM | POA: Diagnosis not present

## 2023-11-24 DIAGNOSIS — I5030 Unspecified diastolic (congestive) heart failure: Secondary | ICD-10-CM | POA: Diagnosis not present

## 2023-11-24 DIAGNOSIS — Z0000 Encounter for general adult medical examination without abnormal findings: Secondary | ICD-10-CM | POA: Diagnosis not present

## 2023-11-27 DIAGNOSIS — M79632 Pain in left forearm: Secondary | ICD-10-CM | POA: Diagnosis not present

## 2023-12-25 DIAGNOSIS — I110 Hypertensive heart disease with heart failure: Secondary | ICD-10-CM | POA: Diagnosis not present

## 2023-12-25 DIAGNOSIS — Z7901 Long term (current) use of anticoagulants: Secondary | ICD-10-CM | POA: Diagnosis not present

## 2023-12-25 DIAGNOSIS — E1151 Type 2 diabetes mellitus with diabetic peripheral angiopathy without gangrene: Secondary | ICD-10-CM | POA: Diagnosis not present

## 2023-12-25 DIAGNOSIS — E7800 Pure hypercholesterolemia, unspecified: Secondary | ICD-10-CM | POA: Diagnosis not present

## 2023-12-25 DIAGNOSIS — D6869 Other thrombophilia: Secondary | ICD-10-CM | POA: Diagnosis not present

## 2023-12-25 DIAGNOSIS — Z794 Long term (current) use of insulin: Secondary | ICD-10-CM | POA: Diagnosis not present

## 2023-12-25 DIAGNOSIS — I480 Paroxysmal atrial fibrillation: Secondary | ICD-10-CM | POA: Diagnosis not present

## 2023-12-25 DIAGNOSIS — I5030 Unspecified diastolic (congestive) heart failure: Secondary | ICD-10-CM | POA: Diagnosis not present

## 2023-12-25 DIAGNOSIS — M109 Gout, unspecified: Secondary | ICD-10-CM | POA: Diagnosis not present

## 2023-12-25 DIAGNOSIS — E1165 Type 2 diabetes mellitus with hyperglycemia: Secondary | ICD-10-CM | POA: Diagnosis not present

## 2023-12-25 DIAGNOSIS — L850 Acquired ichthyosis: Secondary | ICD-10-CM | POA: Diagnosis not present

## 2024-01-14 ENCOUNTER — Ambulatory Visit: Payer: Medicare HMO | Admitting: Podiatry

## 2024-02-03 DIAGNOSIS — E7800 Pure hypercholesterolemia, unspecified: Secondary | ICD-10-CM | POA: Diagnosis not present

## 2024-02-03 DIAGNOSIS — Z794 Long term (current) use of insulin: Secondary | ICD-10-CM | POA: Diagnosis not present

## 2024-02-03 DIAGNOSIS — E1151 Type 2 diabetes mellitus with diabetic peripheral angiopathy without gangrene: Secondary | ICD-10-CM | POA: Diagnosis not present

## 2024-02-03 DIAGNOSIS — I110 Hypertensive heart disease with heart failure: Secondary | ICD-10-CM | POA: Diagnosis not present

## 2024-02-16 ENCOUNTER — Ambulatory Visit (INDEPENDENT_AMBULATORY_CARE_PROVIDER_SITE_OTHER): Payer: Self-pay | Admitting: Podiatry

## 2024-02-16 DIAGNOSIS — Z91198 Patient's noncompliance with other medical treatment and regimen for other reason: Secondary | ICD-10-CM

## 2024-02-16 NOTE — Progress Notes (Signed)
 1. Failure to attend appointment with reason given    Family Emergency. Rescheduled appt.

## 2024-02-23 ENCOUNTER — Ambulatory Visit (INDEPENDENT_AMBULATORY_CARE_PROVIDER_SITE_OTHER): Payer: Self-pay | Admitting: Podiatry

## 2024-02-23 ENCOUNTER — Encounter: Payer: Self-pay | Admitting: Podiatry

## 2024-02-23 VITALS — Ht 64.0 in | Wt 277.4 lb

## 2024-02-23 DIAGNOSIS — B351 Tinea unguium: Secondary | ICD-10-CM

## 2024-02-23 DIAGNOSIS — M79675 Pain in left toe(s): Secondary | ICD-10-CM

## 2024-02-23 DIAGNOSIS — M79674 Pain in right toe(s): Secondary | ICD-10-CM | POA: Diagnosis not present

## 2024-02-23 DIAGNOSIS — I890 Lymphedema, not elsewhere classified: Secondary | ICD-10-CM

## 2024-02-23 DIAGNOSIS — E1151 Type 2 diabetes mellitus with diabetic peripheral angiopathy without gangrene: Secondary | ICD-10-CM

## 2024-02-23 NOTE — Progress Notes (Signed)
 Subjective:  Patient ID: Raymond Gordon, male    DOB: 1951/03/18,  MRN: [id number]  [name] presents to clinic today for at risk foot care. Pt has h/o NIDDM with PAD and painful mycotic toenails x 10 which interfere with daily activities. Pain is relieved with periodic professional debridement.  Chief Complaint  Patient presents with   Nail Problem    Pt is here for [facility] last A1C was 8.4 PCP is Dr Wylene Simmer and LOV was in September.   New problem(s): None.   PCP is [name], Adelfa Koh, MD.  No Known Allergies  Review of Systems: Negative except as noted in the HPI.  Objective:  There were no vitals filed for this visit. Raymond Gordon is a pleasant 73 y.o. male morbidly obese in NAD. AAO x 3.  Vascular Examination: CFT <3 seconds b/l LE. Faintly palpable DP pulses b/l LE. Faintly palpable PT pulse(s) b/l LE. Pedal hair absent. No pain with calf compression b/l. Lower extremity skin temperature gradient within normal limits.   Lymphedema present BLE with hyperpigmented thick skin plaques. There is a deep skin fissure on the anterior aspect of the lower leg, but no open wound. No ischemia or gangrene noted b/l LE. No cyanosis or clubbing noted b/l LE.  Dermatological Examination: Pedal skin is warm and supple b/l LE. No open wounds b/l LE. No interdigital macerations noted b/l LE. Toenails 1-5 bilaterally elongated, discolored, dystrophic, thickened, and crumbly with subungual debris and tenderness to dorsal palpation.   Pedal skin noted to exhibit signs of poor pedal hygiene with noted foot odor b/l and interdigital debris. No open wounds noted.  Neurological Examination: Protective sensation intact 5/5 intact bilaterally with 10g monofilament b/l.  Musculoskeletal Examination: Muscle strength 5/5 to all lower extremity muscle groups bilaterally. Pes planus deformity noted bilateral LE. Utilizes cane for ambulation assistance.  Assessment/Plan: 1. Pain due to onychomycosis of  toenails of both feet   2. Lymphedema   3. Type II diabetes mellitus with peripheral circulatory disorder (HCC)      Orders Placed This Encounter  Procedures   Ambulatory referral to Occupational Therapy    Referral Priority:   Routine    Referral Type:   Occupational Therapy    Referral Reason:   Specialty Services Required    Requested Specialty:   Occupational Therapy    Number of Visits Requested:   1   AMB REFERRAL TO OCCUPATIONAL THERAPY -Patient's family member present. All questions/concerns addressed on today's visit. -Consent given for treatment as described below: -Counseled patient on hygiene practices. His sisters have helped him in the past, but we are trying to encourage him to participate in his care. He remains noncompliant in this regard. -Mycotic toenails 2-5 bilaterally and left great toe were debrided in length and girth with sterile nail nippers and dremel without iatrogenic bleeding. -Loose nailplate right great toe gently debrided to level of adherence. Digit cleansed with alcohol. Triple antibiotic ointment applied to nailbed followed by light dressing. No further treatment required by patient. -Patient/POA to call should there be question/concern in the interim.   Return in about 3 months (around 05/24/2024).  [name], DPM      [place] LOCATION: [address]  [place], [name] [address]                   Office [phone]   [facility] LOCATION: [address] [place], [name] [address] Office [phone]

## 2024-03-08 ENCOUNTER — Emergency Department (HOSPITAL_COMMUNITY)

## 2024-03-08 ENCOUNTER — Other Ambulatory Visit (HOSPITAL_COMMUNITY)

## 2024-03-08 ENCOUNTER — Encounter (HOSPITAL_COMMUNITY): Payer: Self-pay

## 2024-03-08 ENCOUNTER — Other Ambulatory Visit: Payer: Self-pay

## 2024-03-08 ENCOUNTER — Inpatient Hospital Stay (HOSPITAL_COMMUNITY)
Admission: EM | Admit: 2024-03-08 | Discharge: 2024-03-12 | DRG: 291 | Disposition: A | Discharge status: Skilled Nursing Facility | Attending: Internal Medicine | Admitting: Internal Medicine

## 2024-03-08 DIAGNOSIS — M109 Gout, unspecified: Secondary | ICD-10-CM | POA: Diagnosis not present

## 2024-03-08 DIAGNOSIS — G4733 Obstructive sleep apnea (adult) (pediatric): Secondary | ICD-10-CM | POA: Diagnosis not present

## 2024-03-08 DIAGNOSIS — I110 Hypertensive heart disease with heart failure: Secondary | ICD-10-CM | POA: Diagnosis not present

## 2024-03-08 DIAGNOSIS — I509 Heart failure, unspecified: Secondary | ICD-10-CM

## 2024-03-08 DIAGNOSIS — Z823 Family history of stroke: Secondary | ICD-10-CM

## 2024-03-08 DIAGNOSIS — K219 Gastro-esophageal reflux disease without esophagitis: Secondary | ICD-10-CM | POA: Diagnosis not present

## 2024-03-08 DIAGNOSIS — I5031 Acute diastolic (congestive) heart failure: Secondary | ICD-10-CM | POA: Diagnosis present

## 2024-03-08 DIAGNOSIS — E1165 Type 2 diabetes mellitus with hyperglycemia: Secondary | ICD-10-CM | POA: Diagnosis present

## 2024-03-08 DIAGNOSIS — E782 Mixed hyperlipidemia: Secondary | ICD-10-CM | POA: Diagnosis not present

## 2024-03-08 DIAGNOSIS — E66813 Obesity, class 3: Secondary | ICD-10-CM

## 2024-03-08 DIAGNOSIS — E785 Hyperlipidemia, unspecified: Secondary | ICD-10-CM | POA: Diagnosis present

## 2024-03-08 DIAGNOSIS — I5033 Acute on chronic diastolic (congestive) heart failure: Secondary | ICD-10-CM | POA: Diagnosis present

## 2024-03-08 DIAGNOSIS — Z91199 Patient's noncompliance with other medical treatment and regimen due to unspecified reason: Secondary | ICD-10-CM

## 2024-03-08 DIAGNOSIS — Z83438 Family history of other disorder of lipoprotein metabolism and other lipidemia: Secondary | ICD-10-CM

## 2024-03-08 DIAGNOSIS — Z8249 Family history of ischemic heart disease and other diseases of the circulatory system: Secondary | ICD-10-CM

## 2024-03-08 DIAGNOSIS — Z6841 Body Mass Index (BMI) 40.0 and over, adult: Secondary | ICD-10-CM | POA: Diagnosis not present

## 2024-03-08 DIAGNOSIS — Z833 Family history of diabetes mellitus: Secondary | ICD-10-CM

## 2024-03-08 DIAGNOSIS — R000 Tachycardia, unspecified: Secondary | ICD-10-CM

## 2024-03-08 DIAGNOSIS — J9601 Acute respiratory failure with hypoxia: Secondary | ICD-10-CM | POA: Diagnosis present

## 2024-03-08 DIAGNOSIS — Z8042 Family history of malignant neoplasm of prostate: Secondary | ICD-10-CM

## 2024-03-08 DIAGNOSIS — I890 Lymphedema, not elsewhere classified: Secondary | ICD-10-CM | POA: Diagnosis not present

## 2024-03-08 DIAGNOSIS — Z794 Long term (current) use of insulin: Secondary | ICD-10-CM

## 2024-03-08 DIAGNOSIS — R339 Retention of urine, unspecified: Secondary | ICD-10-CM | POA: Diagnosis not present

## 2024-03-08 DIAGNOSIS — M6281 Muscle weakness (generalized): Secondary | ICD-10-CM | POA: Diagnosis not present

## 2024-03-08 DIAGNOSIS — Z7901 Long term (current) use of anticoagulants: Secondary | ICD-10-CM

## 2024-03-08 DIAGNOSIS — E113393 Type 2 diabetes mellitus with moderate nonproliferative diabetic retinopathy without macular edema, bilateral: Secondary | ICD-10-CM | POA: Diagnosis not present

## 2024-03-08 DIAGNOSIS — R600 Localized edema: Secondary | ICD-10-CM | POA: Diagnosis not present

## 2024-03-08 DIAGNOSIS — I959 Hypotension, unspecified: Secondary | ICD-10-CM | POA: Diagnosis not present

## 2024-03-08 DIAGNOSIS — R140 Abdominal distension (gaseous): Secondary | ICD-10-CM | POA: Diagnosis not present

## 2024-03-08 DIAGNOSIS — I480 Paroxysmal atrial fibrillation: Principal | ICD-10-CM | POA: Diagnosis present

## 2024-03-08 DIAGNOSIS — E8809 Other disorders of plasma-protein metabolism, not elsewhere classified: Secondary | ICD-10-CM | POA: Diagnosis present

## 2024-03-08 DIAGNOSIS — N400 Enlarged prostate without lower urinary tract symptoms: Secondary | ICD-10-CM | POA: Diagnosis not present

## 2024-03-08 DIAGNOSIS — I10 Essential (primary) hypertension: Secondary | ICD-10-CM | POA: Diagnosis not present

## 2024-03-08 DIAGNOSIS — N139 Obstructive and reflux uropathy, unspecified: Secondary | ICD-10-CM | POA: Diagnosis not present

## 2024-03-08 DIAGNOSIS — I517 Cardiomegaly: Secondary | ICD-10-CM | POA: Diagnosis not present

## 2024-03-08 DIAGNOSIS — N1330 Unspecified hydronephrosis: Secondary | ICD-10-CM | POA: Diagnosis present

## 2024-03-08 DIAGNOSIS — I4891 Unspecified atrial fibrillation: Secondary | ICD-10-CM | POA: Diagnosis present

## 2024-03-08 DIAGNOSIS — M1A9XX1 Chronic gout, unspecified, with tophus (tophi): Secondary | ICD-10-CM | POA: Diagnosis not present

## 2024-03-08 DIAGNOSIS — N179 Acute kidney failure, unspecified: Secondary | ICD-10-CM

## 2024-03-08 DIAGNOSIS — Z79899 Other long term (current) drug therapy: Secondary | ICD-10-CM | POA: Diagnosis not present

## 2024-03-08 DIAGNOSIS — I499 Cardiac arrhythmia, unspecified: Secondary | ICD-10-CM | POA: Diagnosis not present

## 2024-03-08 DIAGNOSIS — R8271 Bacteriuria: Secondary | ICD-10-CM | POA: Diagnosis not present

## 2024-03-08 DIAGNOSIS — R0989 Other specified symptoms and signs involving the circulatory and respiratory systems: Secondary | ICD-10-CM | POA: Diagnosis not present

## 2024-03-08 DIAGNOSIS — J9612 Chronic respiratory failure with hypercapnia: Secondary | ICD-10-CM | POA: Diagnosis not present

## 2024-03-08 DIAGNOSIS — Z7982 Long term (current) use of aspirin: Secondary | ICD-10-CM

## 2024-03-08 DIAGNOSIS — R0902 Hypoxemia: Secondary | ICD-10-CM | POA: Diagnosis not present

## 2024-03-08 DIAGNOSIS — D649 Anemia, unspecified: Secondary | ICD-10-CM | POA: Diagnosis present

## 2024-03-08 DIAGNOSIS — R0600 Dyspnea, unspecified: Secondary | ICD-10-CM | POA: Diagnosis not present

## 2024-03-08 DIAGNOSIS — E1169 Type 2 diabetes mellitus with other specified complication: Secondary | ICD-10-CM

## 2024-03-08 DIAGNOSIS — N401 Enlarged prostate with lower urinary tract symptoms: Secondary | ICD-10-CM | POA: Diagnosis not present

## 2024-03-08 DIAGNOSIS — R739 Hyperglycemia, unspecified: Secondary | ICD-10-CM | POA: Diagnosis not present

## 2024-03-08 DIAGNOSIS — Z7984 Long term (current) use of oral hypoglycemic drugs: Secondary | ICD-10-CM

## 2024-03-08 DIAGNOSIS — Z87891 Personal history of nicotine dependence: Secondary | ICD-10-CM | POA: Diagnosis not present

## 2024-03-08 DIAGNOSIS — I209 Angina pectoris, unspecified: Secondary | ICD-10-CM | POA: Diagnosis not present

## 2024-03-08 DIAGNOSIS — R2689 Other abnormalities of gait and mobility: Secondary | ICD-10-CM | POA: Diagnosis not present

## 2024-03-08 DIAGNOSIS — K5900 Constipation, unspecified: Secondary | ICD-10-CM | POA: Diagnosis not present

## 2024-03-08 DIAGNOSIS — Z7902 Long term (current) use of antithrombotics/antiplatelets: Secondary | ICD-10-CM | POA: Diagnosis not present

## 2024-03-08 LAB — CBC
HCT: 38.8 % — ABNORMAL LOW (ref 39.0–52.0)
Hemoglobin: 12 g/dL — ABNORMAL LOW (ref 13.0–17.0)
MCH: 28.2 pg (ref 26.0–34.0)
MCHC: 30.9 g/dL (ref 30.0–36.0)
MCV: 91.1 fL (ref 80.0–100.0)
Platelets: 295 10*3/uL (ref 150–400)
RBC: 4.26 MIL/uL (ref 4.22–5.81)
RDW: 15.5 % (ref 11.5–15.5)
WBC: 7.2 10*3/uL (ref 4.0–10.5)
nRBC: 0 % (ref 0.0–0.2)

## 2024-03-08 LAB — TSH: TSH: 2.75 u[IU]/mL (ref 0.350–4.500)

## 2024-03-08 LAB — COMPREHENSIVE METABOLIC PANEL WITH GFR
ALT: 12 U/L (ref 0–44)
AST: 14 U/L — ABNORMAL LOW (ref 15–41)
Albumin: 2.9 g/dL — ABNORMAL LOW (ref 3.5–5.0)
Alkaline Phosphatase: 67 U/L (ref 38–126)
Anion gap: 9 (ref 5–15)
BUN: 10 mg/dL (ref 8–23)
CO2: 20 mmol/L — ABNORMAL LOW (ref 22–32)
Calcium: 8.6 mg/dL — ABNORMAL LOW (ref 8.9–10.3)
Chloride: 112 mmol/L — ABNORMAL HIGH (ref 98–111)
Creatinine, Ser: 0.84 mg/dL (ref 0.61–1.24)
GFR, Estimated: >60 mL/min (ref >60)
Glucose, Bld: 245 mg/dL — ABNORMAL HIGH (ref 70–99)
Potassium: 3.7 mmol/L (ref 3.5–5.1)
Sodium: 141 mmol/L (ref 135–145)
Total Bilirubin: 0.7 mg/dL (ref 0.0–1.2)
Total Protein: 6.9 g/dL (ref 6.5–8.1)

## 2024-03-08 LAB — HEMOGLOBIN A1C
Hgb A1c MFr Bld: 7.9 % — ABNORMAL HIGH (ref 4.8–5.6)
Mean Plasma Glucose: 180.03 mg/dL

## 2024-03-08 LAB — IRON AND TIBC
Iron: 64 ug/dL (ref 45–182)
Saturation Ratios: 20 % (ref 17.9–39.5)
TIBC: 322 ug/dL (ref 250–450)
UIBC: 258 ug/dL

## 2024-03-08 LAB — FERRITIN: Ferritin: 38 ng/mL (ref 24–336)

## 2024-03-08 LAB — CBG MONITORING, ED: Glucose-Capillary: 203 mg/dL — ABNORMAL HIGH (ref 70–99)

## 2024-03-08 LAB — BRAIN NATRIURETIC PEPTIDE: B Natriuretic Peptide: 257.4 pg/mL — ABNORMAL HIGH (ref 0.0–100.0)

## 2024-03-08 MED ORDER — PANTOPRAZOLE SODIUM 40 MG PO TBEC
40.0000 mg | DELAYED_RELEASE_TABLET | Freq: Every day | ORAL | Status: DC
  Administered 2024-03-08 – 2024-03-12 (×5): 40 mg via ORAL
  Filled 2024-03-08 (×5): qty 1

## 2024-03-08 MED ORDER — INSULIN ASPART 100 UNIT/ML IJ SOLN
30.0000 [IU] | Freq: Three times a day (TID) | INTRAMUSCULAR | Status: DC
  Administered 2024-03-08: 30 [IU] via SUBCUTANEOUS

## 2024-03-08 MED ORDER — ACETAMINOPHEN 650 MG RE SUPP: 650.0000 mg | Freq: Four times a day (QID) | RECTAL | Status: DC | PRN

## 2024-03-08 MED ORDER — APIXABAN 5 MG PO TABS
5.0000 mg | ORAL_TABLET | Freq: Two times a day (BID) | ORAL | Status: DC
  Administered 2024-03-08 – 2024-03-12 (×9): 5 mg via ORAL
  Filled 2024-03-08 (×9): qty 1

## 2024-03-08 MED ORDER — FINASTERIDE 5 MG PO TABS
5.0000 mg | ORAL_TABLET | Freq: Every day | ORAL | Status: DC
  Administered 2024-03-08 – 2024-03-12 (×5): 5 mg via ORAL
  Filled 2024-03-08 (×5): qty 1

## 2024-03-08 MED ORDER — DILTIAZEM HCL-DEXTROSE 125-5 MG/125ML-% IV SOLN (PREMIX)
5.0000 mg/h | INTRAVENOUS | Status: DC
  Administered 2024-03-08: 5 mg/h via INTRAVENOUS
  Administered 2024-03-08: 15 mg/h via INTRAVENOUS
  Filled 2024-03-08 (×3): qty 125

## 2024-03-08 MED ORDER — DILTIAZEM LOAD VIA INFUSION
10.0000 mg | Freq: Once | INTRAVENOUS | Status: AC
  Administered 2024-03-08: 10 mg via INTRAVENOUS
  Filled 2024-03-08: qty 10

## 2024-03-08 MED ORDER — POTASSIUM CHLORIDE CRYS ER 20 MEQ PO TBCR
40.0000 meq | EXTENDED_RELEASE_TABLET | Freq: Once | ORAL | Status: AC
  Administered 2024-03-08: 40 meq via ORAL
  Filled 2024-03-08: qty 2

## 2024-03-08 MED ORDER — FUROSEMIDE 10 MG/ML IJ SOLN
40.0000 mg | Freq: Once | INTRAMUSCULAR | Status: AC
  Administered 2024-03-08: 40 mg via INTRAVENOUS
  Filled 2024-03-08: qty 4

## 2024-03-08 MED ORDER — ACETAMINOPHEN 325 MG PO TABS: 650.0000 mg | ORAL_TABLET | Freq: Four times a day (QID) | ORAL | Status: DC | PRN

## 2024-03-08 MED ORDER — INSULIN ASPART 100 UNIT/ML IJ SOLN
0.0000 [IU] | Freq: Three times a day (TID) | INTRAMUSCULAR | Status: DC
  Administered 2024-03-08: 7 [IU] via SUBCUTANEOUS
  Administered 2024-03-09: 4 [IU] via SUBCUTANEOUS
  Administered 2024-03-09: 3 [IU] via SUBCUTANEOUS
  Administered 2024-03-10 – 2024-03-11 (×5): 4 [IU] via SUBCUTANEOUS
  Administered 2024-03-12: 3 [IU] via SUBCUTANEOUS
  Administered 2024-03-12: 4 [IU] via SUBCUTANEOUS

## 2024-03-08 MED ORDER — METOPROLOL TARTRATE 25 MG PO TABS
25.0000 mg | ORAL_TABLET | Freq: Two times a day (BID) | ORAL | Status: DC
  Administered 2024-03-08 – 2024-03-12 (×8): 25 mg via ORAL
  Filled 2024-03-08 (×9): qty 1

## 2024-03-08 MED ORDER — ATORVASTATIN CALCIUM 80 MG PO TABS
80.0000 mg | ORAL_TABLET | Freq: Every day | ORAL | Status: DC
  Administered 2024-03-08 – 2024-03-12 (×5): 80 mg via ORAL
  Filled 2024-03-08 (×5): qty 1

## 2024-03-08 MED ORDER — FUROSEMIDE 10 MG/ML IJ SOLN
40.0000 mg | Freq: Two times a day (BID) | INTRAMUSCULAR | Status: DC
  Administered 2024-03-08 – 2024-03-09 (×3): 40 mg via INTRAVENOUS
  Filled 2024-03-08 (×3): qty 4

## 2024-03-08 NOTE — H&P (Signed)
 History and Physical    Patient: Raymond Gordon ZOX:[id number] DOB: Mar 25, 1951 DOA: 03/08/2024 DOS: the patient was seen and examined on 03/08/2024 PCP: Gaspar Garbe, MD  Patient coming from: Home  Chief Complaint:  Chief Complaint  Patient presents with   Shortness of Breath   HPI: Raymond Gordon is a 73 y.o. male with medical history significant of chronic diastolic heart failure, atrial fibrillation on Eliquis, type 2 diabetes, hypertension, hyperlipidemia, obesity, chronic normocytic anemia, severe OSA, BPH presenting to the ED with shortness of breath with exertion over the past day.  Patient states that he was in his usual state of health until yesterday.  He began feeling mild shortness of breath with exertion without any other symptoms.  States that it worsened significantly earlier today and he noted significant difficulty with just moving around his home.  Given persistence of symptoms, he called EMS who noted that patient was in A-fib RVR with rates near 200.  They were unable to obtain IV access in the field and thus placed patient on oxygen and brought him to the ED for further evaluation.  Patient denies any nausea, vomiting, fevers, chills, chest pain, cough, abdominal pain.  He does note mild palpitations that began earlier today.  Patient also reports somewhat decreased urination over the past few days.  ED course: Vital signs notable for tachycardia in the 160s and tachypnea in the 20s.  Patient was hypoxic on room air and thus again placed on 2 L [place].  CBC with anemia around his baseline, otherwise unremarkable.  CMP with glucose 245, hypoalbuminemia, kidney function at his baseline.  BNP 257.  Chest x-ray showing cardiomegaly with chronic vascular congestion and possible asymmetric left perihilar edema.  Patient started on Cardizem drip and given dose of IV Lasix 40 mg in the ED.  [facility] hospitalist asked to evaluate patient for admission.  Review of Systems: As mentioned in  the history of present illness. All other systems reviewed and are negative. Past Medical History:  Diagnosis Date   Bilateral lower extremity edema    Chronic diastolic CHF (congestive heart failure) (HCC) 08/15/2015   a. 06/2015: echo showing EF of 65-70% with Grade 2 DD   Diabetes (HCC)    INSULIN DEPENDENT   Dysrhythmia    Hyperlipidemia    Hypertension    Obesity    Obesity    Paroxysmal atrial fibrillation (HCC)    Sleep apnea    Past Surgical History:  Procedure Laterality Date   CARDIAC CATHETERIZATION     CARDIAC CATHETERIZATION N/A 2012   no large vessel occlusions   Social History:  reports that he quit smoking about 26 years ago. His smoking use included cigarettes. He quit smokeless tobacco use about 8 years ago. He reports that he does not drink alcohol and does not use drugs.  No Known Allergies  Family History  Problem Relation Age of Onset   Heart failure Brother    Hyperlipidemia Brother    Hypertension Brother    Stroke Brother    Hypertension Mother    Hyperlipidemia Mother    Diabetes Mother    Cancer Father        prostate   GER disease Father    Stroke Father    Hypertension Sister    Hyperlipidemia Sister    Hypertension Maternal Grandmother    Stroke Maternal Grandmother    Diabetes Maternal Grandfather    Hypertension Maternal Grandfather     Prior to Admission medications  Medication Sig Start Date End Date Taking? Authorizing Provider  allopurinol (ZYLOPRIM) 300 MG tablet Take 300 mg by mouth daily.  05/02/15   [provider]  amLODipine (NORVASC) 10 MG tablet Take 1 tablet by mouth once daily 11/17/23   Berry, Jonathan J, MD  apixaban Herby Lolling) 5 MG TABS tablet Take 1 tablet by mouth twice daily 09/19/23   Berry, Jonathan J, MD  aspirin EC 81 MG tablet Take 81 mg by mouth daily.    [provider]  atorvastatin (LIPITOR) 80 MG tablet Take 1 tablet by mouth once daily 06/18/23   Berry, Jonathan J, MD  carvedilol  (COREG) 25 MG tablet TAKE ONE & ONE-HALF TABLETS BY MOUTH TWICE DAILY 03/18/16   Berry, Jonathan J, MD  doxazosin (CARDURA) 4 MG tablet Take 4 mg by mouth daily.  11/04/13   [provider]  FARXIGA 10 MG TABS tablet  03/15/20   [provider]  finasteride (PROSCAR) 5 MG tablet Take 5 mg by mouth daily. 04/11/22   [provider]  furosemide (LASIX) 80 MG tablet Take 1 tablet (80 mg total) by mouth 2 (two) times daily. 08/22/15   Vann, Jessica U, DO  HUMULIN R U-500 KWIKPEN 500 UNIT/ML kwikpen INJECT 80 UNITS SUBCUTANEOUSLY WITH BREAKFAST AND 60 WITH LUNCH AND 80 WITH SUPPER 12/02/18   [provider]  indomethacin (INDOCIN) 25 MG capsule one tablet PO BID PRN for gout 11/16/15   [provider]  insulin aspart (NOVOLOG) 100 UNIT/ML injection Inject 30-60 Units into the skin 3 (three) times daily before meals.    [provider]  Insulin Pen Needle (NOVOFINE) 30G X 8 MM MISC BID use for Flexped 05/06/11   [provider]  isosorbide mononitrate (IMDUR) 30 MG 24 hr tablet Take 1 tablet (30 mg total) by mouth daily. 08/22/15   Vann, Jessica U, DO  Lancets (ONETOUCH DELICA PLUS [place]) MISC use to self monitor blood glucose four times daily (E11.65) 12/03/18   [provider]  losartan (COZAAR) 100 MG tablet Take 100 mg by mouth daily. 10/31/18   [provider]  Multiple Vitamin (MULTIVITAMIN WITH MINERALS) TABS tablet Take 1 tablet by mouth daily.    [provider]  omeprazole (PRILOSEC OTC) 20 MG tablet Take 1 tablet (20 mg total) by mouth daily. Patient not taking: Reported on 06/04/2023 11/09/14   Avanell Leigh, MD  omeprazole (PRILOSEC) 20 MG capsule Take 20 mg by mouth daily. 10/05/18   [provider]  ONETOUCH VERIO test strip USE 1 STRIP TO CHECK GLUCOSE 4 TIMES DAILY 12/03/18   [provider]  PFIZER-BIONT COVID-19 VAC-TRIS SUSP injection  02/27/21   [provider]  PRESCRIPTION  MEDICATION Inhale into the lungs at bedtime. Pt is on C Pap machine    [provider]  spironolactone (ALDACTONE) 25 MG tablet Take 25 mg by mouth daily. 04/26/22   [provider]  Sulfamethoxazole-Trimethoprim (SULFAMETHOXAZOLE-TMP DS PO) Take by mouth 2 (two) times daily.    [provider]  Zoster Vaccine Adjuvanted [facility]) injection Inject series of 2 injections as directed 08/20/18   [provider]    Physical Exam: Vitals:   03/08/24 1200 03/08/24 1215 03/08/24 1230 03/08/24 1331  BP:  (!) 106/91 (!) 141/83   Pulse: (!) 103 (!) 120 (!) 119   Resp: (!) 28 (!) 24 (!) 23   Temp:    97.7 F (36.5 C)  TempSrc:    Oral  SpO2: 97%  98% 98%   Weight:      Height:       Physical Exam Constitutional:      Appearance: He is obese.     Comments: Chronically ill-appearing  HENT:     Head: Normocephalic and atraumatic.     Mouth/Throat:     Mouth: Mucous membranes are moist.     Pharynx: Oropharynx is clear. No oropharyngeal exudate.  Eyes:     General: No scleral icterus.    Extraocular Movements: Extraocular movements intact.     Pupils: Pupils are equal, round, and reactive to light.  Cardiovascular:     Rate and Rhythm: Tachycardia present. Rhythm irregular.     Heart sounds: No murmur heard.    No friction rub. No gallop.     Comments: AF with RVR, with rates in 130s on telemetry. Pulmonary:     Effort: Pulmonary effort is normal. No respiratory distress.     Comments: Exam limited by body habitus. No obvious crackles, wheezing, or rhonchi noted. Saturating >95% on 2L [place]. Abdominal:     General: Bowel sounds are normal. There is no distension.     Palpations: Abdomen is soft.     Tenderness: There is no abdominal tenderness. There is no guarding or rebound.  Musculoskeletal:        General: Normal range of motion.     Cervical back: Normal range of motion.     Comments: 2+ pitting edema in bilateral LE up to knees.  Skin:    General:  Skin is warm and dry.     Comments: Chronic discoloration of skin on bilateral legs  Neurological:     General: No focal deficit present.     Mental Status: He is alert and oriented to person, place, and time.  Psychiatric:        Mood and Affect: Mood normal.        Behavior: Behavior normal.     Data Reviewed:  There are no new results to review at this time.    Latest Ref Rng & Units 03/08/2024    9:54 AM 03/27/2023   12:45 PM 08/22/2015    3:50 AM  CBC  WBC 4.0 - 10.5 K/uL 7.2  5.7  5.7   Hemoglobin 13.0 - 17.0 g/dL 16.1  09.6  04.5   Hematocrit 39.0 - 52.0 % 38.8  37.6  39.0   Platelets 150 - 400 K/uL 295  319  291       Latest Ref Rng & Units 03/08/2024    9:54 AM 06/04/2023    2:22 PM 03/27/2023   12:45 PM  CMP  Glucose 70 - 99 mg/dL 409  811  86   BUN 8 - 23 mg/dL 10  21  16    Creatinine 0.61 - 1.24 mg/dL 9.14  7.82  9.56   Sodium 135 - 145 mmol/L 141  144  147   Potassium 3.5 - 5.1 mmol/L 3.7  3.8  3.9   Chloride 98 - 111 mmol/L 112  105  107   CO2 22 - 32 mmol/L 20  21  22    Calcium 8.9 - 10.3 mg/dL 8.6  9.5  9.7   Total Protein 6.5 - 8.1 g/dL 6.9     Total Bilirubin 0.0 - 1.2 mg/dL 0.7     Alkaline Phos 38 - 126 U/L 67     AST 15 - 41 U/L 14     ALT 0 - 44 U/L 12  BNP    Component Value Date/Time   BNP 257.4 (H) 03/08/2024 0954   DG Chest Port 1 View Result Date: 03/08/2024 CLINICAL DATA:  Dyspnea. EXAM: PORTABLE CHEST 1 VIEW COMPARISON:  Radiographs 08/13/2015 and 07/08/2015. FINDINGS: 1138 hours. Lordotic positioning. Stable cardiomegaly and chronic vascular congestion. There is increased asymmetric left perihilar opacity which may reflect asymmetric edema. No consolidation or pneumothorax. There may be a small left pleural effusion. The bones appear unchanged. Telemetry leads overlie the chest. IMPRESSION: Cardiomegaly with chronic vascular congestion and possible asymmetric left perihilar edema. Short-term radiographic follow-up recommended.  Electronically Signed   By: Carey Bullocks M.D.   On: 03/08/2024 13:23     Assessment and Plan: No notes have been filed under this hospital service. Service: Hospitalist  AF with RVR Patient presented with shortness of breath since yesterday and palpitations from earlier this morning, found to be in atrial fibrillation with RVR.  He was started on Cardizem drip in the ED.  During my encounter, patient with rates in the 110s to 120s though does increase to the 130s at times.  He has already been titrated to 15 mg/hr of Cardizem.  Cardiology has been consulted.  Patient did have a blood pressure reading in the 100/90s while on the diltiazem drip, though repeat BP is around 141/83.  Have resumed his home Eliquis.  Holding home Coreg to avoid precipitating hypotension while on diltiazem drip.  He is currently hemodynamically stable. - Cardiology consulted, appreciate assistance - Diltiazem drip - Holding home Coreg - Telemetry - Follow-up TSH - Follow-up echocardiogram - Admit to inpatient/progressive  Acute diastolic HF Last echocardiogram in 2016 showing LVEF 65 to 70%, no regional wall motion abnormalities, grade 2 diastolic dysfunction.  He is on carvedilol 25 mg every morning and 12.5 mg every evening, Lasix 80 mg twice daily, Imdur 30 mg daily, losartan 100 mg daily, spironolactone 25 mg daily, farxiga 10mg  daily at home.  BNP elevated at 257, though this may be an underestimation given significant obesity.  Chest x-ray showing cardiomegaly with chronic vascular congestion.  He was given a dose of IV Lasix 40 mg in the ED.  He does appear volume overloaded on exam with pitting edema in his lower extremities.  Lung exam and JVD difficult to assess given habitus. Will hold home medications given he is on diltiazem drip with risk for hypotension. He is currently saturating well on 2L [place], will wean to RA as tolerated. -cardiology consulted, appreciate assistance -IV lasix 40mg  BID -holding home  GDMT -supplemental O2 prn to maintain O2 sats >92%, wean to RA as tolerated -f/u ECHO -strict I/O's, daily weights  T2DM with hyperglycemia No recent A1c on file. Patient reports taking short-acting insulin at 50 units with breakfast, 40 units with lunch, 35 units with dinner. Also on farxiga 10mg  daily. He does not know if he is taking U-500 insulin or U-100. Glucose 245 on CMP. Will start on novolog 30u TID with meals and add resistant SSI for now. Can adjust as necessary. No signs of DKA at this time. -f/u A1c -novolog 30u TID -resistant SSI -holding home farxiga -trend CBGs, goal 140-180  HTN Patient taking Norvasc 10 mg daily, Coreg 25 mg every morning and and 12.5 mg every evening, Farxiga 10 mg daily, Imdur 30 mg daily, losartan 100 mg daily, spironolactone 25 mg daily at home.  Blood pressures in the 110s-130s/50s-60s initially.  After initiation of diltiazem drip, noted to have blood pressure of 106/91 but this did  improve on subsequent checks.  Will hold home antihypertensives while patient is on diltiazem drip to avoid precipitating hypotension. - Diltiazem drip - Holding home antihypertensives - Trend BP curve, goal MAP greater than 65  Chronic normocytic anemia Patient with baseline hgb around 11.5-12.5. On admission, hgb 12.0. No reported bleeding events. Will check iron studies. -chronic and stable -f/u iron studies -trend hgb curve  HLD -resume home lipitor 80mg  daily  Obesity -BMI 47.61 -encouraged lifestyle modifications  Severe OSA -resume home CPAP qhs  BPH -resume home finasteride 5mg  daily -holding home doxazosin  GERD -resume protonix in place of home PPI  Gout -holding home allopurinol and indomethacin   Advance Care Planning:   Code Status: Full Code   Consults: cardiology  Family Communication: updated family at bedside  Severity of Illness: The appropriate patient status for this patient is INPATIENT. Inpatient status is judged to be  reasonable and necessary in order to provide the required intensity of service to ensure the patient's safety. The patient's presenting symptoms, physical exam findings, and initial radiographic and laboratory data in the context of their chronic comorbidities is felt to place them at high risk for further clinical deterioration. Furthermore, it is not anticipated that the patient will be medically stable for discharge from the hospital within 2 midnights of admission.   * I certify that at the point of admission it is my clinical judgment that the patient will require inpatient hospital care spanning beyond 2 midnights from the point of admission due to high intensity of service, high risk for further deterioration and high frequency of surveillance required.*  Portions of this note were generated with Dragon dictation software. Dictation errors may occur despite best attempts at proofreading.  Author: Kasai Beltran H Zendaya Groseclose, MD 03/08/2024 1:31 PM  For on call review [email or web address].

## 2024-03-08 NOTE — ED Provider Notes (Signed)
 [facility] EM[facility]GENC[facility] DEPARTMENT AT [facility] Provider Note   CSN: [id number] Arrival date & time: 03/08/24  7628     History  Chief Complaint  Patient presents with   Shortness of Breath    Raymond Gordon is a 73 y.o. male.  HPI 73 year old male presents today via [facility] for complaints of shortness of breath.  Patient with history of paroxysmal atrial fibrillation, morbid obesity, hypertension, hyperlipidemia, CHF, diabetes, acute respiratory failure, presents today, planing of increased dyspnea this morning.  He states that he has chronic edema and has had it for an extended period of time however, sudden onset of dyspnea and shortness of breath today.  On EMS arrival he was noted to be in A-fib RVR, near 200.  They were unable to obtain prehospital IV access and transported patient here with no definite interventions noted other than oxygen and monitor    Home Medications Prior to Admission medications   Medication Sig Start Date End Date Taking? Authorizing Provider  allopurinol (ZYLOPRIM) 300 MG tablet Take 300 mg by mouth daily.  05/02/15   [provider]  amLODipine (NORVASC) 10 MG tablet Take 1 tablet by mouth once daily 11/17/23   Runell Gess, MD  apixaban Everlene Balls) 5 MG TABS tablet Take 1 tablet by mouth twice daily 09/19/23   Runell Gess, MD  aspirin EC 81 MG tablet Take 81 mg by mouth daily.    [provider]  atorvastatin (LIPITOR) 80 MG tablet Take 1 tablet by mouth once daily 06/18/23   Runell Gess, MD  carvedilol (COREG) 25 MG tablet TAKE ONE & ONE-HALF TABLETS BY MOUTH TWICE DAILY 03/18/16   Runell Gess, MD  doxazosin (CARDURA) 4 MG tablet Take 4 mg by mouth daily.  11/04/13   [provider]  FARXIGA 10 MG TABS tablet  03/15/20   [provider]  finasteride (PROSCAR) 5 MG tablet Take 5 mg by mouth daily. 04/11/22   [provider]  furosemide (LASIX) 80 MG tablet Take 1 tablet (80  mg total) by mouth 2 (two) times daily. 08/22/15   Joseph Art, DO  HUMULIN R U-500 KWIKPEN 500 UNIT/ML kwikpen INJECT 80 UNITS SUBCUTANEOUSLY WITH BREAKFAST AND 60 WITH LUNCH AND 80 WITH SUPPER 12/02/18   [provider]  indomethacin (INDOCIN) 25 MG capsule one tablet PO BID PRN for gout 11/16/15   [provider]  insulin aspart (NOVOLOG) 100 UNIT/ML injection Inject 30-60 Units into the skin 3 (three) times daily before meals.    [provider]  Insulin Pen Needle (NOVOFINE) 30G X 8 MM MISC BID use for Flexped 05/06/11   [provider]  isosorbide mononitrate (IMDUR) 30 MG 24 hr tablet Take 1 tablet (30 mg total) by mouth daily. 08/22/15   Joseph Art, DO  Lancets (ONETOUCH DELICA PLUS [place]) MISC use to self monitor blood glucose four times daily (E11.65) 12/03/18   [provider]  losartan (COZAAR) 100 MG tablet Take 100 mg by mouth daily. 10/31/18   [provider]  Multiple Vitamin (MULTIVITAMIN WITH MINERALS) TABS tablet Take 1 tablet by mouth daily.    [provider]  omeprazole (PRILOSEC OTC) 20 MG tablet Take 1 tablet (20 mg total) by mouth daily. Patient not taking: Reported on 06/04/2023 11/09/14   Runell Gess, MD  omeprazole (PRILOSEC) 20 MG capsule Take 20 mg by mouth daily. 10/05/18   [provider]  Lum Babe test  strip USE 1 STRIP TO CHECK GLUCOSE 4 TIMES DAILY 12/03/18   [provider]  PFIZER-BIONT COVID-19 VAC-TRIS SUSP injection  02/27/21   [provider]  PRESCRIPTION MEDICATION Inhale into the lungs at bedtime. Pt is on C Pap machine    [provider]  spironolactone (ALDACTONE) 25 MG tablet Take 25 mg by mouth daily. 04/26/22   [provider]  Sulfamethoxazole-Trimethoprim (SULFAMETHOXAZOLE-TMP DS PO) Take by mouth 2 (two) times daily.    [provider]  Zoster Vaccine Adjuvanted [facility]) injection Inject series of 2 injections as directed  08/20/18   [provider]      Allergies    Patient has no known allergies.    Review of Systems   Review of Systems  Physical Exam Updated Vital Signs BP (!) 141/83   Pulse (!) 119   Temp 98.4 F (36.9 C) (Oral)   Resp (!) 23   Ht 1.626 m (5\' 4" )   Wt 125.8 kg   SpO2 98%   BMI 47.61 kg/m  Physical Exam Vitals reviewed.  HENT:     Head: Normocephalic.     Mouth/Throat:     Mouth: Mucous membranes are moist.  Eyes:     Pupils: Pupils are equal, round, and reactive to light.  Cardiovascular:     Rate and Rhythm: Tachycardia present. Rhythm irregular.  Pulmonary:     Effort: Pulmonary effort is normal.     Breath sounds: Examination of the right-lower field reveals rhonchi. Examination of the left-lower field reveals rhonchi. Rhonchi present.  Abdominal:     Palpations: Abdomen is soft.  Musculoskeletal:     Cervical back: Normal range of motion.     Right lower leg: No tenderness. Edema present.     Left lower leg: No tenderness. Edema present.  Skin:    General: Skin is warm and dry.     Capillary Refill: Capillary refill takes less than 2 seconds.  Neurological:     General: No focal deficit present.     Mental Status: He is alert.  Psychiatric:        Mood and Affect: Mood normal.     ED Results / Procedures / Treatments   Labs (all labs ordered are listed, but only abnormal results are displayed) Labs Reviewed  CBC - Abnormal; Notable for the following components:      Result Value   Hemoglobin 12.0 (*)    HCT 38.8 (*)    All other components within normal limits  COMPREHENSIVE METABOLIC PANEL WITH GFR - Abnormal; Notable for the following components:   Chloride 112 (*)    CO2 20 (*)    Glucose, Bld 245 (*)    Calcium 8.6 (*)    Albumin 2.9 (*)    AST 14 (*)    All other components within normal limits  BRAIN NATRIURETIC PEPTIDE - Abnormal; Notable for the following components:   B Natriuretic Peptide 257.4 (*)    All other components  within normal limits    EKG None   ED ECG REPORT   Date: 03/08/2024  Rate: 161  Rhythm: atrial fibrillation  QRS Axis: normal  Intervals:  afib  ST/T Wave abnormalities: nonspecific ST changes  Conduction Disutrbances:none  Narrative Interpretation:   Old EKG Reviewed: a fib is new from first prior tracing of 13 August 2015 nsr  I have personally reviewed the EKG tracing and agree with the computerized printout as noted.  Radiology No results found.  Procedures .  Critical Care  Performed by: Auston Blush, MD Authorized by: Auston Blush, MD   Critical care provider statement:    Critical care time (minutes):  60   Critical care end time:  03/08/2024 12:10 PM   Critical care time was exclusive of:  Separately billable procedures and treating other patients and teaching time   Critical care was necessary to treat or prevent imminent or life-threatening deterioration of the following conditions:  Cardiac failure and respiratory failure   Critical care was time spent personally by me on the following activities:  Development of treatment plan with patient or surrogate, discussions with consultants, evaluation of patient's response to treatment, examination of patient, ordering and review of laboratory studies, ordering and review of radiographic studies, ordering and performing treatments and interventions, pulse oximetry, re-evaluation of patient's condition and review of old charts     Medications Ordered in ED Medications  diltiazem (CARDIZEM) 125 mg in dextrose 5% 125 mL (1 mg/mL) infusion (15 mg/hr Intravenous Rate/Dose Change 03/08/24 1203)  diltiazem (CARDIZEM) 1 mg/mL load via infusion 10 mg (10 mg Intravenous Bolus from Bag 03/08/24 1018)  furosemide (LASIX) injection 40 mg (40 mg Intravenous Given 03/08/24 1219)  1ED Course/ Medical Decision Making/ A&P Clinical Course as of 03/08/24 1237  Mon Mar 08, 2024  1211 CBC is reviewed and interpreted and appears  normal Complete metabolic panel reviewed interpreted significant for hyperglycemia, decreased CO2 and chloride otherwise normal BNP elevated at 257 [DR]  1211 Chest x-Dwaine Pringle reviewed and interpreted significant for cardiomegaly with increased markings most consistent with CHF, awaiting radiologist interpretation [DR]  1211 EKG reviewed interpreted significant for A-fib RVR with nonspecific ST changes [DR]    Clinical Course User Index [DR] Auston Blush, MD                                 Medical Decision Making Amount and/or Complexity of Data Reviewed Labs: ordered. Radiology: ordered.  Risk Prescription drug management.   1- afib rvr- patient with known ho afib on eliquis now with RVR Cardizem ordered and infusing EKG with a fib rvr 160 new from first prior Patient on Cardizem drip and heart rate down to 120 with stable blood pressure and feels improved 2-dyspnea-likely secondarty to 1 and cxr pending, co2 decrease to 20, new oxygen requirement. 12:10 PM  Oxygen saturations have increased to 98% on nasal cannula Chest x-Ilya Ess with increased markings consistent with CHF and Lasix ordered 3- hyperglycemia- doubt dka  Patient maintained on monitor Patient treated with Cardizem IV and Cardizem drip Patient's evaluation shows critically ill patient and need for admission to hospital I discussed the above with the patient and family members at bedside Social determinants of health records reveal patient with history of tobacco use        Final Clinical Impression(s) / ED Diagnoses Final diagnoses:  Paroxysmal A-fib (HCC)  Rapid heart beat  Acute on chronic congestive heart failure, unspecified heart failure type [facility])    Rx / DC Orders ED Discharge Orders     None         Auston Blush, MD 03/08/24 1237

## 2024-03-08 NOTE — ED Notes (Signed)
 Contact Geraldine for updates, Lenon Radar is out of town

## 2024-03-08 NOTE — Progress Notes (Signed)
Heart rate is too high for accurate echo at this time. 

## 2024-03-08 NOTE — ED Triage Notes (Signed)
 Pt arrived via [facility] from home for c/o SOB. Per EMS, pt stated he noticed his abdomen getting distended worse and worse each day over the past week. Per EMS, pt was in A-fib w/RVR and the highest HR was 200. Per EMS, pt has rales in lower lobes. Pt has distended abdomen that is soft. Pt has 2+ edema in upper extremities and 4+ edema in lower extremities.

## 2024-03-08 NOTE — Consult Note (Addendum)
 Cardiology Consultation   Patient ID: Raymond Gordon MRN: [id number]; DOB: September 05, 1951  Admit date: 03/08/2024 Date of Consult: 03/08/2024  PCP:  Raymond Garbe, MD   [facility] Providers Cardiologist:  Raymond Batty, MD   {    Patient Profile:   Raymond Gordon is a 73 y.o. male with a hx of paroxysmal atrial fibrillation, chronic HFpEF, hypertension, hyperlipidemia, type 2 diabetes, OSA on CPAP who is being seen 03/08/2024 for the evaluation of A. Fib with RVR, acute on chronic HFpEF at the request of Raymond Gordon.  History of Present Illness:   Raymond Gordon has past medical history as stated above. He presented to [facility] ED on 03/08/2024 via EMS for shortness of breath. He noticed distention in his abdomen worse each day over the past week. He was noted to be in A. Fib with RVR with rates up to 200s. EMS noted there to be bibasilar crackles, 2+ edema in upper extremities and 4+ in lower extremities.   Patient has a known history of atrial fibrillation. He was last seen in the outpatient setting by Raymond Gordon on 06/04/2023. At this visit it was noted that he was maintaining NSR with HR in the 80s. He was on CoReg and Eliquis. For HFpEF his last echo showed EF 65-70%, he denied any symptoms. BNP was checked at this visit and noted to be 108. He was doing well on his current regimen and was euvolemic at this visit. Noted to be 277 lbs.   Relevant workup in the ED/since admission: initial EKG showed A. Fib with RVR with HR 161 (HR recovered to 120s on follow up EKGs), CBC stable, BNP 257, CXR showed cardiomegaly with chronic vascular congestion and possible asymmetric left perihilar edema.  In the ED, he was started on IV diltiazem, IV Lasix 40 mg x 1, started back on home Lipitor 80 mg and Eliquis 5 mg. Since being placed on IV diltiazem patients heart rate has recovered from 160s to 110s. However when I examined the patient in the room his HR was regularly up to 130-140s while  on 15 mg/hr of IV diltiazem. He was started on IV Lasix 40 mg BID, which he reports already having a good urine response to.   After speaking with patient and his family (sisters) they agree with the history as stated above. They believe that there has been some progressive element to his CHF exacerbation symptoms but it peaked yesterday with shortness of breath, which is what prompted them to come to the ED. The patient denies any recent changes in medications, diet, fluid intake. He denies any recent illnesses or known sick contacts. The family present in the room admits that their sister who usually takes care of his medications has been out of the country so they cannot be certain that he was getting his medications as prescribed. He denied any chest pain during this episode. Due to his body habitus it is hard to assess volume status. He does not wear oxygen at home but has been requiring supplemental oxygen while he is here.   Past Medical History:  Diagnosis Date   Bilateral lower extremity edema    Chronic diastolic CHF (congestive heart failure) (HCC) 08/15/2015   a. 06/2015: echo showing EF of 65-70% with Grade 2 DD   Diabetes (HCC)    INSULIN DEPENDENT   Dysrhythmia    Hyperlipidemia    Hypertension    Obesity    Obesity  Paroxysmal atrial fibrillation (HCC)    Sleep apnea    Past Surgical History:  Procedure Laterality Date   CARDIAC CATHETERIZATION     CARDIAC CATHETERIZATION N/A 2012   no large vessel occlusions    Home Medications:  Prior to Admission medications   Medication Sig Start Date End Date Taking? Authorizing Provider  allopurinol (ZYLOPRIM) 300 MG tablet Take 300 mg by mouth daily.  05/02/15  Yes [provider]  amLODipine (NORVASC) 10 MG tablet Take 1 tablet by mouth once daily 11/17/23   Raymond Gess, MD  apixaban Everlene Balls) 5 MG TABS tablet Take 1 tablet by mouth twice daily 09/19/23   Raymond Gess, MD  aspirin EC 81 MG tablet Take 81 mg  by mouth daily.    [provider]  atorvastatin (LIPITOR) 80 MG tablet Take 1 tablet by mouth once daily 06/18/23   Raymond Gess, MD  carvedilol (COREG) 25 MG tablet TAKE ONE & ONE-HALF TABLETS BY MOUTH TWICE DAILY 03/18/16   Raymond Gess, MD  doxazosin (CARDURA) 4 MG tablet Take 4 mg by mouth daily.  11/04/13   [provider]  FARXIGA 10 MG TABS tablet  03/15/20   [provider]  finasteride (PROSCAR) 5 MG tablet Take 5 mg by mouth daily. 04/11/22   [provider]  furosemide (LASIX) 80 MG tablet Take 1 tablet (80 mg total) by mouth 2 (two) times daily. 08/22/15   Joseph Art, DO  HUMULIN R U-500 KWIKPEN 500 UNIT/ML kwikpen INJECT 80 UNITS SUBCUTANEOUSLY WITH BREAKFAST AND 60 WITH LUNCH AND 80 WITH SUPPER 12/02/18   [provider]  indomethacin (INDOCIN) 25 MG capsule one tablet PO BID PRN for gout 11/16/15   [provider]  insulin aspart (NOVOLOG) 100 UNIT/ML injection Inject 30-60 Units into the skin 3 (three) times daily before meals.    [provider]  isosorbide mononitrate (IMDUR) 30 MG 24 hr tablet Take 1 tablet (30 mg total) by mouth daily. 08/22/15   Joseph Art, DO  losartan (COZAAR) 100 MG tablet Take 100 mg by mouth daily. 10/31/18   [provider]  Multiple Vitamin (MULTIVITAMIN WITH MINERALS) TABS tablet Take 1 tablet by mouth daily.    [provider]  omeprazole (PRILOSEC OTC) 20 MG tablet Take 1 tablet (20 mg total) by mouth daily. Patient not taking: Reported on 06/04/2023 11/09/14   Raymond Gess, MD  omeprazole (PRILOSEC) 20 MG capsule Take 20 mg by mouth daily. 10/05/18   [provider]  PRESCRIPTION MEDICATION Inhale into the lungs at bedtime. Pt is on C Pap machine    [provider]  spironolactone (ALDACTONE) 25 MG tablet Take 25 mg by mouth daily. 04/26/22   [provider]  Sulfamethoxazole-Trimethoprim (SULFAMETHOXAZOLE-TMP DS PO) Take by mouth  2 (two) times daily.    [provider]   Inpatient Medications: Scheduled Meds:  apixaban  5 mg Oral BID   atorvastatin  80 mg Oral Daily   finasteride  5 mg Oral Daily   furosemide  40 mg Intravenous BID   insulin aspart  0-20 Units Subcutaneous TID WC   insulin aspart  30 Units Subcutaneous TID WC   pantoprazole  40 mg Oral Daily   Continuous Infusions:  diltiazem (CARDIZEM) infusion 15 mg/hr (03/08/24 1332)   PRN Meds: acetaminophen **OR** acetaminophen  Allergies:   No Known Allergies  Social History:   Social History   Socioeconomic History   Marital status:  Single    Spouse name: Not on file   Number of children: Not on file   Years of education: Not on file   Highest education level: Not on file  Occupational History   Not on file  Tobacco Use   Smoking status: Former    Current packs/day: 0.00    Types: Cigarettes    Quit date: 11/25/1997    Years since quitting: 26.3   Smokeless tobacco: Former    Quit date: 06/14/2015  Substance and Sexual Activity   Alcohol use: No    Alcohol/week: 0.0 standard drinks of alcohol   Drug use: No   Sexual activity: Not on file  Other Topics Concern   Not on file  Social History Narrative   Not on file   Social Drivers of Health   Financial Resource Strain: Not on file  Food Insecurity: Not on file  Transportation Needs: Not on file  Physical Activity: Not on file  Stress: Not on file  Social Connections: Not on file  Intimate Partner Violence: Not on file    Family History:   Family History  Problem Relation Age of Onset   Heart failure Brother    Hyperlipidemia Brother    Hypertension Brother    Stroke Brother    Hypertension Mother    Hyperlipidemia Mother    Diabetes Mother    Cancer Father        prostate   GER disease Father    Stroke Father    Hypertension Sister    Hyperlipidemia Sister    Hypertension Maternal Grandmother    Stroke Maternal Grandmother    Diabetes Maternal Grandfather     Hypertension Maternal Grandfather     ROS:  Please see the history of present illness.  All other ROS reviewed and negative.     Physical Exam/Data:   Vitals:   03/08/24 1200 03/08/24 1215 03/08/24 1230 03/08/24 1331  BP:  (!) 106/91 (!) 141/83   Pulse: (!) 103 (!) 120 (!) 119   Resp: (!) 28 (!) 24 (!) 23   Temp:    97.7 F (36.5 C)  TempSrc:    Oral  SpO2: 97% 98% 98%   Weight:      Height:       No intake or output data in the 24 hours ending 03/08/24 1456    03/08/2024    9:41 AM 02/23/2024    3:27 PM 06/04/2023    1:49 PM  Last 3 Weights  Weight (lbs) 277 lb 5.4 oz 277 lb 6.4 oz 277 lb 6.4 oz  Weight (kg) 125.8 kg 125.828 kg 125.828 kg     Body mass index is 47.61 kg/m.   General: chronically ill appearing, obese man, on 4 L oxygen via [place] HEENT: normal Neck: unable to assess JVD due to body habitus  Vascular: Distal pulses 2+ bilaterally Cardiac:  irregularly irregular rhythm, tachycardic; no murmur  Lungs:  difficult exam 2/2 body habitus Abd: soft, nontender Ext: 2+ pitting edema on bilateral LE, chronic discoloration, skin changes of LE Skin: warm and dry  Neuro:  no focal abnormalities noted Psych:  Normal affect   EKG:  The EKG was personally reviewed and demonstrates:  atrial fibrillation, HR 128   Telemetry:  Telemetry was personally reviewed and demonstrates:  atrial fibrillation, HR 130-140s   Relevant CV Studies: Echocardiogram, updated ordered pending results   Laboratory Data:  High Sensitivity Troponin:  No results for input(s): "TROPONINIHS" in the last 720 hours.  Chemistry Recent Labs  Lab 03/08/24 0954  NA 141  K 3.7  CL 112*  CO2 20*  GLUCOSE 245*  BUN 10  CREATININE 0.84  CALCIUM 8.6*  GFRNONAA >60  ANIONGAP 9    Recent Labs  Lab 03/08/24 0954  PROT 6.9  ALBUMIN 2.9*  AST 14*  ALT 12  ALKPHOS 67  BILITOT 0.7   Lipids No results for input(s): "CHOL", "TRIG", "HDL", "LABVLDL", "LDLCALC", "CHOLHDL" in the last 168  hours.  Hematology Recent Labs  Lab 03/08/24 0954  WBC 7.2  RBC 4.26  HGB 12.0*  HCT 38.8*  MCV 91.1  MCH 28.2  MCHC 30.9  RDW 15.5  PLT 295   Thyroid No results for input(s): "TSH", "FREET4" in the last 168 hours.  BNP Recent Labs  Lab 03/08/24 0954  BNP 257.4*    DDimer No results for input(s): "DDIMER" in the last 168 hours.  Radiology/Studies:  DG Chest Port 1 View Result Date: 03/08/2024 CLINICAL DATA:  Dyspnea. EXAM: PORTABLE CHEST 1 VIEW COMPARISON:  Radiographs 08/13/2015 and 07/08/2015. FINDINGS: 1138 hours. Lordotic positioning. Stable cardiomegaly and chronic vascular congestion. There is increased asymmetric left perihilar opacity which may reflect asymmetric edema. No consolidation or pneumothorax. There may be a small left pleural effusion. The bones appear unchanged. Telemetry leads overlie the chest. IMPRESSION: Cardiomegaly with chronic vascular congestion and possible asymmetric left perihilar edema. Short-term radiographic follow-up recommended. Electronically Signed   By: Elmon Hagedorn M.D.   On: 03/08/2024 13:23   Assessment and Plan:   Acute on chronic HFpEF Shortness of breath Presented to the ED with shortness of breath and LE edema worse from baseline  Echo from 2016: EF 65-70%, G2DD, moderately dilated LA BNP 257 today CXR showed cardiomegaly with chronic vascular congestion  Given IV Lasix 40 mg x 1 in ED Currently on 4 L oxygen via [place], does not wear home oxygen -- wean as tolerated  Home GDMT: CoReg 25 mg in AM, 12.5 mg in PM, PO Lasix 80 mg BID, Imdur 30 mg daily, Losartan 100 mg daily, spironolactone 25 mg daily, Farxiga 10 mg daily  Patients family report that his sister who typically handles his medications has been out of the country recently so there is a chance he has not been taking them appropriately Currently on IV Lasix 40 mg BID, reports good urine response -- will monitor response at this dose and adjust accordingly  Most of GDMT has  been held since starting IV diltiazem to avoid hypotension Adding on Lopressor 25 mg BID Continue strict I&O's, daily weights, daily BMPs Updated echo ordered, pending results   Paroxysmal atrial fibrillation with RVR  Patient has documented history of PAF He reports that he typically cannot tell when/if he enters A. Fib but was noted to be in NSR at recent cardiology appointments  TSH pending  Found to be in A. Fib with RVR, rates almost nearing 200s per EMS Started on IV diltiazem in the ED, currently titrated up to 15 mg/hr with HR remaining 130-140s when I was in the room  Home meds (as above) being held per primary to avoid hypotension  Currently on IV diltiazem Due to HR remaining elevated at this dose and without updated echo results will add on Lopressor 25 mg BID -- can consider IV amiodarone if his rates do not get under better control with this  Currently continuing home Eliquis 5 mg BID -- reports good compliance with this at home Will tentatively put on  the schedule for DCCV tomorrow if he does not convert to NSR before then   Hypertension Most recent BP 141/83 Currently on IV diltiazem, currently on 15 mg/hr  Adding on Lopressor 25 mg BID Home meds (as above) have been held by primary to avoid hypotension   Hyperlipidemia Last lipid panel from 2012, check updated tomorrow AM Continue home Lipitor 80 mg daily   Per primary Type 2 diabetes Severe OSA on CPAP  BPH GERD Gout  Anemia   Risk Assessment/Risk Scores:       New [place]  Heart Association (NYHA) Functional Class NYHA Class III  CHA2DS2-VASc Score = 4   This indicates a 4.8% annual risk of stroke. The patient's score is based upon: CHF History: 1 HTN History: 1 Diabetes History: 1 Stroke History: 0 Vascular Disease History: 0 Age Score: 1 Gender Score: 0         For questions or updates, please contact [facility] Please consult [email or web address] for contact info under     Signed, Jiles Mote, PA-C  03/08/2024 2:56 PM

## 2024-03-08 NOTE — ED Notes (Signed)
 I attempted to obtain an IV on pt and was unsuccessful. I put in a consult for IV team

## 2024-03-08 NOTE — Significant Event (Signed)
 Patient was noticed to have phimosis.  Blood pressure was 150/60.  Patient asymptomatic.  Discussed with Dr. Hayes Lipps cardiologist on-call who reviewed the telemetry and at the time patient had converted to sinus rhythm and Dr. Hayes Lipps feels patient's pauses were related to patient having conversion pauses.  Cardizem infusion will be discontinued since patient has converted to sinus rhythm and continue metoprolol.  If patient converts back to A-fib to  Restart Cardizem infusion at a lower rate 5 mg/h.  Raymond Gordon.

## 2024-03-08 NOTE — ED Notes (Signed)
 Placed pt on 2L 02 per [place].

## 2024-03-08 NOTE — ED Notes (Signed)
 Sister [name] [phone] would like an update asap and called when moved upstairs to a unit

## 2024-03-08 NOTE — ED Notes (Signed)
 Vascular called wanting to know the HR of the PT. PT is currently at 149 so ECHO can not be completed at this time.

## 2024-03-09 ENCOUNTER — Inpatient Hospital Stay (HOSPITAL_COMMUNITY)

## 2024-03-09 ENCOUNTER — Encounter (HOSPITAL_COMMUNITY)
Admission: EM | Disposition: A | Discharge status: Skilled Nursing Facility | Payer: Self-pay | Source: Home / Self Care | Attending: Internal Medicine

## 2024-03-09 ENCOUNTER — Encounter (HOSPITAL_COMMUNITY): Payer: Self-pay | Admitting: Anesthesiology

## 2024-03-09 DIAGNOSIS — I509 Heart failure, unspecified: Secondary | ICD-10-CM | POA: Diagnosis not present

## 2024-03-09 DIAGNOSIS — I4891 Unspecified atrial fibrillation: Secondary | ICD-10-CM

## 2024-03-09 DIAGNOSIS — I5033 Acute on chronic diastolic (congestive) heart failure: Secondary | ICD-10-CM | POA: Diagnosis not present

## 2024-03-09 DIAGNOSIS — Z794 Long term (current) use of insulin: Secondary | ICD-10-CM | POA: Diagnosis not present

## 2024-03-09 DIAGNOSIS — I480 Paroxysmal atrial fibrillation: Secondary | ICD-10-CM

## 2024-03-09 DIAGNOSIS — E1165 Type 2 diabetes mellitus with hyperglycemia: Secondary | ICD-10-CM | POA: Diagnosis not present

## 2024-03-09 LAB — BASIC METABOLIC PANEL WITH GFR
Anion gap: 12 (ref 5–15)
BUN: 23 mg/dL (ref 8–23)
CO2: 20 mmol/L — ABNORMAL LOW (ref 22–32)
Calcium: 8.8 mg/dL — ABNORMAL LOW (ref 8.9–10.3)
Chloride: 113 mmol/L — ABNORMAL HIGH (ref 98–111)
Creatinine, Ser: 1.42 mg/dL — ABNORMAL HIGH (ref 0.61–1.24)
GFR, Estimated: 52 mL/min — ABNORMAL LOW (ref >60)
Glucose, Bld: 89 mg/dL (ref 70–99)
Potassium: 4.4 mmol/L (ref 3.5–5.1)
Sodium: 145 mmol/L (ref 135–145)

## 2024-03-09 LAB — LIPID PANEL
Cholesterol: 146 mg/dL (ref 0–200)
HDL: 41 mg/dL (ref >40)
LDL Cholesterol: 96 mg/dL (ref 0–99)
Total CHOL/HDL Ratio: 3.6 ratio
Triglycerides: 46 mg/dL (ref <150)
VLDL: 9 mg/dL (ref 0–40)

## 2024-03-09 LAB — CBC
HCT: 37.7 % — ABNORMAL LOW (ref 39.0–52.0)
Hemoglobin: 12 g/dL — ABNORMAL LOW (ref 13.0–17.0)
MCH: 28.3 pg (ref 26.0–34.0)
MCHC: 31.8 g/dL (ref 30.0–36.0)
MCV: 88.9 fL (ref 80.0–100.0)
Platelets: 334 10*3/uL (ref 150–400)
RBC: 4.24 MIL/uL (ref 4.22–5.81)
RDW: 15.6 % — ABNORMAL HIGH (ref 11.5–15.5)
WBC: 8.1 10*3/uL (ref 4.0–10.5)
nRBC: 0 % (ref 0.0–0.2)

## 2024-03-09 LAB — ECHOCARDIOGRAM COMPLETE
AR max vel: 2.61 cm2
AV Area VTI: 2.52 cm2
AV Area mean vel: 2.54 cm2
AV Mean grad: 2 mmHg
AV Peak grad: 3.4 mmHg
Ao pk vel: 0.92 m/s
Area-P 1/2: 6.17 cm2
Height: 64 in
MV VTI: 2.41 cm2
S' Lateral: 4.6 cm
Weight: 4571.46 [oz_av]

## 2024-03-09 LAB — GLUCOSE, CAPILLARY
Glucose-Capillary: 80 mg/dL (ref 70–99)
Glucose-Capillary: 157 mg/dL — ABNORMAL HIGH (ref 70–99)
Glucose-Capillary: 141 mg/dL — ABNORMAL HIGH (ref 70–99)
Glucose-Capillary: 160 mg/dL — ABNORMAL HIGH (ref 70–99)

## 2024-03-09 SURGERY — CARDIOVERSION (CATH LAB)
Anesthesia: General

## 2024-03-09 MED ORDER — HYDRALAZINE HCL 25 MG PO TABS
25.0000 mg | ORAL_TABLET | Freq: Three times a day (TID) | ORAL | Status: DC
  Administered 2024-03-09 – 2024-03-12 (×10): 25 mg via ORAL
  Filled 2024-03-09 (×10): qty 1

## 2024-03-09 MED ORDER — ISOSORBIDE MONONITRATE ER 30 MG PO TB24
30.0000 mg | ORAL_TABLET | Freq: Every day | ORAL | Status: DC
  Administered 2024-03-09 – 2024-03-12 (×4): 30 mg via ORAL
  Filled 2024-03-09 (×4): qty 1

## 2024-03-09 MED ORDER — SPIRONOLACTONE 25 MG PO TABS: 25.0000 mg | ORAL_TABLET | Freq: Every day | ORAL | Status: DC

## 2024-03-09 MED ORDER — SODIUM CHLORIDE 0.9% FLUSH
3.0000 mL | Freq: Two times a day (BID) | INTRAVENOUS | Status: DC
  Administered 2024-03-09: 10 mL via INTRAVENOUS
  Administered 2024-03-09 – 2024-03-10 (×2): 3 mL via INTRAVENOUS
  Administered 2024-03-10: 10 mL via INTRAVENOUS
  Administered 2024-03-11: 3 mL via INTRAVENOUS
  Administered 2024-03-11 – 2024-03-12 (×2): 10 mL via INTRAVENOUS

## 2024-03-09 MED ORDER — DAPAGLIFLOZIN PROPANEDIOL 10 MG PO TABS: 10.0000 mg | ORAL_TABLET | Freq: Every day | ORAL | Status: DC

## 2024-03-09 MED ORDER — SODIUM CHLORIDE 0.9% FLUSH: 3.0000 mL | INTRAVENOUS | Status: DC | PRN

## 2024-03-09 NOTE — TOC CM/SW Note (Addendum)
 Transition of Care [facility]) - Inpatient Brief Assessment   Patient Details  Name: CONSTANT MANDEVILLE MRN: [id number] Date of Birth: 1951-11-25  [facility] CM/SW Contact:    Arron Big, LCSWA Phone Number: 03/09/2024, 11:12 AM   Clinical Narrative: Per daily meeting with treatment team, patient lives at home alone. Per chart review, patient previously had [facility]. Patient has cpap needs - home cpap was ordered in 2016 as well. Patient has PCP and rolator at home. PT/OT ordered - recs pending.  TOC will continue to follow.     Transition of Care Asessment: Insurance and Status: Insurance coverage has been reviewed Patient has primary care physician: Yes Home environment has been reviewed: Home alone Prior level of function:: Independent Prior/Current Home Services: No current home services Social Drivers of Health Review: SDOH reviewed no interventions necessary Readmission risk has been reviewed: No Transition of care needs: no transition of care needs at this time

## 2024-03-09 NOTE — ED Notes (Signed)
 Delay in transporting pt to inpatient bed room 3E27.  Per floor nurse,  room is not clean and must be zapped.  KM

## 2024-03-09 NOTE — Progress Notes (Addendum)
 Patient Name: JAYVEN NAILL Date of Encounter: 03/09/2024 [facility] Cardiologist: Nanetta Batty, MD   Interval Summary  .    Feels better this morning, reports some UOP but not significant. Remains on 2L [place]  Vital Signs .    Vitals:   03/09/24 0500 03/09/24 0524 03/09/24 0552 03/09/24 0738  BP: (!) 139/55  (!) 152/56 (!) 142/61  Pulse: 60  64 62  Resp: 17  (!) 24 20  Temp:  98.3 F (36.8 C) 98.1 F (36.7 C) 98.1 F (36.7 C)  TempSrc:  Oral Oral Oral  SpO2: 97%  96% 96%  Weight: 129.6 kg     Height:        Intake/Output Summary (Last 24 hours) at 03/09/2024 1008 Last data filed at 03/09/2024 4098 Gross per 24 hour  Intake 173.59 ml  Output --  Net 173.59 ml      03/09/2024    5:00 AM 03/08/2024    9:41 AM 02/23/2024    3:27 PM  Last 3 Weights  Weight (lbs) 285 lb 11.5 oz 277 lb 5.4 oz 277 lb 6.4 oz  Weight (kg) 129.6 kg 125.8 kg 125.828 kg      Telemetry/ECG    Atrial fibrillation with conversion pause of 4.6 sec at 1836, then conversion to SR at 2231  - Personally Reviewed  Physical Exam .    GEN: Chronically ill appearing male, on 2L [place]  Neck: unable to assess JVD Cardiac: RRR, no murmurs, rubs, or gallops.  Respiratory: distant breath sounds GI: Soft, nontender, non-distended  MS: Chronic bilateral LE venous changes with discoloration   Assessment & Plan .     73 y.o. male with a hx of paroxysmal atrial fibrillation, chronic HFpEF, hypertension, hyperlipidemia, type 2 diabetes, OSA on CPAP who was seen 03/08/2024 for the evaluation of A. Fib with RVR, acute on chronic HFpEF at the request of Dr. Austin Miles.   Acute on Chronic HFpEF Acute hypoxic respiratory failure -- Initially presented to ED with shortness of breath and worsening lower extremity edema.  BNP 257, chest x-ray with cardiomegaly and chronic vascular congestion.  -- On IV Lasix 40 mg twice daily, no urine output documented the patient does report he is urinating.  Weight 277>>  285lb suspect inaccurate.  Difficult to assess volume status given body habitus.  Continue IV Lasix for now, follow UOP and weights -- Echocardiogram 4/15 with LVEF of 55 to 60% normal RV, moderately dilated left atrium no significant valvular disease -- GDMT: Initially held on admission in the setting of soft blood pressures with IV diltiazem.  Continue metoprolol 25 mg twice daily, plan to resume spiro 25mg  daily and farxiga (if BMET looks ok)  Paroxysmal atrial fibrillation with RVR -- Known history of paroxysmal atrial fibrillation, presented with heart rates initially in the 200s per EMS, noted in the 130-140 range while in the ED. -- Initially placed on IV diltiazem but had 4.6-second conversion pause last evening therefore this was stopped.  He ultimately converted to sinus rhythm around 2230, and maintaining -- Continue Eliquis 5 mg twice daily, metoprolol 25 mg twice daily  HTN -- Did have some issues with hypotension while on IV diltiazem, BPs now elevated this morning -- Continue metoprolol 25 mg twice daily, add spironolactone 25 mg daily (if Cr stable)  HLD -- On atorvastatin 80 mg daily  Per Primary Severe OSA on CPAP BPH GERD DM  Addendum: with elevated Cr, will add Imdur 30mg  and Hydralazine 25mg   TID  For questions or updates, please contact [facility] Please consult [email or web address] for contact info under        Signed, Johnie Nailer, NP

## 2024-03-09 NOTE — Progress Notes (Signed)
   03/09/24 2320  Therapy Vitals  Resp 18  MEWS Score/Color  MEWS Score 0  MEWS Score Color Green  Oxygen Therapy/Pulse Ox  O2 Device Nasal Cannula  O2 Therapy Oxygen  O2 Flow Rate (L/min) 2.5 L/min  SpO2 98 %   Pt refused CPAP at this time. Vitals stable. CPAP set up and placed on the pt and he pulled it off and refused.

## 2024-03-09 NOTE — Progress Notes (Signed)
 [facility] PROGRESS NOTE   Raymond Gordon ZOX:[id number] DOB: Dec 25, 1950 DOA: 03/08/2024  PCP: Gaspar Garbe, MD  Brief History: 73 y.o. male with medical history significant of chronic diastolic heart failure, atrial fibrillation on Eliquis, type 2 diabetes, hypertension, hyperlipidemia, obesity, chronic normocytic anemia, severe OSA, BPH presenting to the ED with shortness of breath with exertion.  Patient found to have atrial fibrillation with RVR.  Concern was for acute CHF as well.  Patient was hospitalized for further management.  Consultants: Cardiology  Procedures: Echocardiogram is pending    Subjective/Interval History: Patient mentions that he still is a bit short of breath.  Denies any chest pain.  No dizziness or lightheadedness.  Sister is at the bedside.    Assessment/Plan:  Atrial fibrillation with RVR Prior to admission patient was not on any rate limiting drugs.  He was anticoagulated with Eliquis. Patient was placed on Cardizem infusion.  Patient was seen by cardiology.  Initial plan was for DC cardioversion. Patient has converted to sinus rhythm overnight. Cardizem infusion was discontinued overnight.  Currently on metoprolol 25 mg twice a day. Echocardiogram is pending. TSH is 2.75.  Acute diastolic CHF/acute respiratory failure with hypoxia No recent echocardiogram.  Echocardiogram from 2016 showed normal LVEF with grade 2 diastolic dysfunction. Repeat echocardiogram has been ordered. Patient currently on IV furosemide. He has chronic lymphedema. Strict ins and outs and daily weights. Currently requiring oxygen by nasal cannula.  Chest x-ray did suggest pulmonary edema.  Diabetes mellitus type 2 with hyperglycemia Noted to be on high-dose NovoLog with meals.  Low glucose levels noted this morning.  We will change his insulin therapy in the hospital. HbA1c 7.9.  Essential hypertension Prior to admission he was on amlodipine losartan  spironolactone. Occasional high readings noted otherwise stable. All of the above-mentioned medications are currently on hold.  He is currently only on metoprolol and furosemide.  Chronic normocytic anemia Baseline hemoglobin between 11 and 12.  Stable.  No evidence of overt bleeding.  Hyperlipidemia Continue statin.  Sleep apnea CPAP  History of BPH Continue finasteride.  Doxazosin on hold.  History of gout May resume his allopurinol.  Indomethacin currently on hold.  Class III obesity Estimated body mass index is 49.04 kg/m as calculated from the following:   Height as of this encounter: 5\' 4"  (1.626 m).   Weight as of this encounter: 129.6 kg.   DVT Prophylaxis: On apixaban Code Status: Full code Family Communication: Discussed with patient and his sister Disposition Plan: Hopefully return home when improved  Status is: Inpatient Remains inpatient appropriate because: Acute diastolic CHF, acute respiratory failure with hypoxia      Medications: Scheduled:  apixaban  5 mg Oral BID   atorvastatin  80 mg Oral Daily   finasteride  5 mg Oral Daily   furosemide  40 mg Intravenous BID   insulin aspart  0-20 Units Subcutaneous TID WC   metoprolol tartrate  25 mg Oral BID   pantoprazole  40 mg Oral Daily   sodium chloride flush  3-10 mL Intravenous Q12H   Continuous: WJX:[id number] **OR** acetaminophen, sodium chloride flush  Antibiotics: Anti-infectives (From admission, onward)    None       Objective:  Vital Signs  Vitals:   03/09/24 0500 03/09/24 0524 03/09/24 0552 03/09/24 0738  BP: (!) 139/55  (!) 152/56 (!) 142/61  Pulse: 60  64 62  Resp: 17  (!) 24 20  Temp:  98.3 F (36.8 C) 98.1 F (36.7  C) 98.1 F (36.7 C)  TempSrc:  Oral Oral Oral  SpO2: 97%  96% 96%  Weight: 129.6 kg     Height:        Intake/Output Summary (Last 24 hours) at 03/09/2024 0902 Last data filed at 03/09/2024 9604 Gross per 24 hour  Intake 173.59 ml  Output --  Net  173.59 ml   Filed Weights   03/08/24 0941 03/09/24 0500  Weight: 125.8 kg 129.6 kg    General appearance: Awake alert.  In no distress Resp: Few crackles bilateral bases.  No wheezing or rhonchi.  Normal effort at rest. Cardio: S1-S2 is normal regular.  No S3-S4.  No rubs murmurs or bruit GI: Abdomen is soft.  Nontender nondistended.  Bowel sounds are present normal.  No masses organomegaly Extremities: Lymphedema noted both lower extremities Neurologic: Alert and oriented x3.  No focal neurological deficits.    Lab Results:  Data Reviewed: I have personally reviewed following labs and reports of the imaging studies  CBC: Recent Labs  Lab 03/08/24 0954  WBC 7.2  HGB 12.0*  HCT 38.8*  MCV 91.1  PLT 295    Basic Metabolic Panel: Recent Labs  Lab 03/08/24 0954  NA 141  K 3.7  CL 112*  CO2 20*  GLUCOSE 245*  BUN 10  CREATININE 0.84  CALCIUM 8.6*    GFR: Estimated Creatinine Clearance: 96.8 mL/min (by C-G formula based on SCr of 0.84 mg/dL).  Liver Function Tests: Recent Labs  Lab 03/08/24 0954  AST 14*  ALT 12  ALKPHOS 67  BILITOT 0.7  PROT 6.9  ALBUMIN 2.9*    HbA1C: Recent Labs    03/08/24 1306  HGBA1C 7.9*    CBG: Recent Labs  Lab 03/08/24 1654 03/09/24 0831  GLUCAP 203* 80    Thyroid Function Tests: Recent Labs    03/08/24 1307  TSH 2.750    Anemia Panel: Recent Labs    03/08/24 0954  FERRITIN 38  TIBC 322  IRON 64    Radiology Studies: DG Chest Port 1 View Result Date: 03/08/2024 CLINICAL DATA:  Dyspnea. EXAM: PORTABLE CHEST 1 VIEW COMPARISON:  Radiographs 08/13/2015 and 07/08/2015. FINDINGS: 1138 hours. Lordotic positioning. Stable cardiomegaly and chronic vascular congestion. There is increased asymmetric left perihilar opacity which may reflect asymmetric edema. No consolidation or pneumothorax. There may be a small left pleural effusion. The bones appear unchanged. Telemetry leads overlie the chest. IMPRESSION:  Cardiomegaly with chronic vascular congestion and possible asymmetric left perihilar edema. Short-term radiographic follow-up recommended. Electronically Signed   By: Elmon Hagedorn M.D.   On: 03/08/2024 13:23       LOS: 1 day   Maylene Spear  [facility] Pager on [email or web address]  03/09/2024, 9:02 AM

## 2024-03-09 NOTE — Progress Notes (Signed)
 Patient noted to be in NSR. EKG obtained showing NSR. EKG placed on chart.

## 2024-03-09 NOTE — Progress Notes (Signed)
  Echocardiogram 2D Echocardiogram has been performed.  Raymond Gordon 03/09/2024, 9:25 AM

## 2024-03-09 NOTE — Evaluation (Signed)
 Physical Therapy Evaluation Patient Details Name: Raymond Gordon MRN: [id number] DOB: December 18, 1950 Today's Date: 03/09/2024  History of Present Illness  73 y.o. male with a hx of paroxysmal atrial fibrillation, chronic HFpEF, hypertension, hyperlipidemia, type 2 diabetes, OSA on CPAP who was seen 03/08/2024 for the evaluation of A. Fib with RVR, acute on chronic HFpEF at the request of Dr. Austin Miles.   Clinical Impression  Pt admitted with above. Per chart pt lives alone and uses a rollator. Per patient his sister takes him to his MD appts, friend takes him grocery shopping, he sponge baths, and cooks for himself. At this time pt presenting with edema in bilat LEs, deconditioning, decreased insight to safety and currently requires modA for all transfers. Suspect pt to have great difficulty with hygiene as pt with discoloration and dead skin build up t/o body from poor hygiene. At this time pt unsafe to return home alone as he is at significant fall risk and is unable to safely care for himself indep. Pt to benefit from inpatient rehab program < 3 hrs a day to allow for increased time to achieve safe mod I level of function for safe transition back to home alone. Acute PT to cont to follow.      If plan is discharge home, recommend the following: A lot of help with walking and/or transfers;A lot of help with bathing/dressing/bathroom;Assistance with cooking/housework;Assist for transportation;Help with stairs or ramp for entrance;Supervision due to cognitive status   Can travel by private vehicle   No    Equipment Recommendations  (TBD at next venue, has rollator at home)  Recommendations for Other Services       Functional Status Assessment Patient has had a recent decline in their functional status and demonstrates the ability to make significant improvements in function in a reasonable and predictable amount of time.     Precautions / Restrictions Precautions Precautions:  Fall Restrictions Weight Bearing Restrictions Per Provider Order: No      Mobility  Bed Mobility Overal bed mobility: Needs Assistance Bed Mobility: Supine to Sit     Supine to sit: Mod assist, Max assist, HOB elevated, Used rails     General bed mobility comments: HOB elevated, increased time, labored effort modA to bring hips to EOB, maxA for trunk elevation    Transfers Overall transfer level: Needs assistance Equipment used: Rolling walker (2 wheels) Transfers: Sit to/from Stand, Bed to chair/wheelchair/BSC Sit to Stand: Mod assist   Step pivot transfers: Mod assist       General transfer comment: modA to power up from elevated surface height, pt with wide base of support requiring modA to steady to bring feet into walker. Pt with minimal foot clearance when completing transfer to chair. pt with noted SOB, SPO2 at 94% on 2lO2 via [place].    Ambulation/Gait               General Gait Details: limtied to step pvt to chair due to fatigue and weakness  Stairs            Wheelchair Mobility     Tilt Bed    Modified Rankin (Stroke Patients Only)       Balance Overall balance assessment: Needs assistance Sitting-balance support: Feet supported, Bilateral upper extremity supported Sitting balance-Leahy Scale: Fair     Standing balance support: Bilateral upper extremity supported, During functional activity, Reliant on assistive device for balance Standing balance-Leahy Scale: Poor Standing balance comment: dependent on RW  Pertinent Vitals/Pain Pain Assessment Pain Assessment: No/denies pain    Home Living Family/patient expects to be discharged to:: Private residence Living Arrangements: Alone Available Help at Discharge: Family;Friend(s);Available PRN/intermittently Type of Home: House Home Access: Stairs to enter Entrance Stairs-Rails: Right Entrance Stairs-Number of Steps: 1   Home Layout: One  level Home Equipment: Rollator (4 wheels) Additional Comments: question accuracy of home set up, per chart pt does live home alone    Prior Function Prior Level of Function : Independent/Modified Independent             Mobility Comments: per chart pt uses a rollator, pt reports he doesn't drive and sister takes him to MD appts, pt also states neighbor takes him to the grocery store ADLs Comments: pt reports independence however pt appears to not be able to properly bath himself or car for himself     Extremity/Trunk Assessment   Upper Extremity Assessment Upper Extremity Assessment: Generalized weakness    Lower Extremity Assessment Lower Extremity Assessment: Generalized weakness (noted bilat LE edema)    Cervical / Trunk Assessment Cervical / Trunk Assessment: Other exceptions Cervical / Trunk Exceptions: large body habitus  Communication   Communication Communication: Impaired Factors Affecting Communication: Reduced clarity of speech    Cognition Arousal: Alert Behavior During Therapy: Flat affect   PT - Cognitive impairments: No family/caregiver present to determine baseline                       PT - Cognition Comments: pt with muffled/garbled speech, pt A&Ox4 however demo's decreased insight to deficits and safety Following commands: Intact       Cueing Cueing Techniques: Verbal cues, Tactile cues     General Comments General comments (skin integrity, edema, etc.): edema t/o LEs, pt with discoloration t/o body from poor hygiene    Exercises     Assessment/Plan    PT Assessment Patient needs continued PT services  PT Problem List Decreased strength;Decreased range of motion;Decreased balance;Decreased activity tolerance;Decreased mobility       PT Treatment Interventions DME instruction;Stair training;Gait training;Functional mobility training;Therapeutic activities;Therapeutic exercise;Balance training    PT Goals (Current goals can be  found in the Care Plan section)  Acute Rehab PT Goals Patient Stated Goal: didn't state PT Goal Formulation: With patient Time For Goal Achievement: 03/23/24 Potential to Achieve Goals: Good    Frequency Min 2X/week     Co-evaluation               AM-PAC PT "6 Clicks" Mobility  Outcome Measure Help needed turning from your back to your side while in a flat bed without using bedrails?: A Lot Help needed moving from lying on your back to sitting on the side of a flat bed without using bedrails?: A Lot Help needed moving to and from a bed to a chair (including a wheelchair)?: A Lot Help needed standing up from a chair using your arms (e.g., wheelchair or bedside chair)?: A Lot Help needed to walk in hospital room?: Total Help needed climbing 3-5 steps with a railing? : Total 6 Click Score: 10    End of Session Equipment Utilized During Treatment: Gait belt;Oxygen Activity Tolerance: Patient limited by fatigue Patient left: in chair;with call bell/phone within reach;with chair alarm set Nurse Communication: Mobility status PT Visit Diagnosis: Unsteadiness on feet (R26.81);Muscle weakness (generalized) (M62.81);Difficulty in walking, not elsewhere classified (R26.2)    Time: 1610-9604 PT Time Calculation (min) (ACUTE ONLY): 23 min   Charges:  PT Evaluation $PT Eval Moderate Complexity: 1 Mod   PT General Charges $$ ACUTE PT VISIT: 1 Visit         Renaee Caro, PT, DPT Acute Rehabilitation Services Secure chat preferred Office #: [phone]   Jenna Moan 03/09/2024, 1:49 PM

## 2024-03-10 ENCOUNTER — Inpatient Hospital Stay (HOSPITAL_COMMUNITY)

## 2024-03-10 DIAGNOSIS — E1169 Type 2 diabetes mellitus with other specified complication: Secondary | ICD-10-CM | POA: Diagnosis not present

## 2024-03-10 DIAGNOSIS — I5033 Acute on chronic diastolic (congestive) heart failure: Secondary | ICD-10-CM

## 2024-03-10 DIAGNOSIS — I4891 Unspecified atrial fibrillation: Secondary | ICD-10-CM | POA: Diagnosis not present

## 2024-03-10 DIAGNOSIS — E785 Hyperlipidemia, unspecified: Secondary | ICD-10-CM

## 2024-03-10 DIAGNOSIS — I10 Essential (primary) hypertension: Secondary | ICD-10-CM

## 2024-03-10 DIAGNOSIS — E66813 Obesity, class 3: Secondary | ICD-10-CM

## 2024-03-10 DIAGNOSIS — I480 Paroxysmal atrial fibrillation: Secondary | ICD-10-CM | POA: Diagnosis not present

## 2024-03-10 DIAGNOSIS — N179 Acute kidney failure, unspecified: Secondary | ICD-10-CM

## 2024-03-10 LAB — CBC
HCT: 34.6 % — ABNORMAL LOW (ref 39.0–52.0)
Hemoglobin: 10.8 g/dL — ABNORMAL LOW (ref 13.0–17.0)
MCH: 27.8 pg (ref 26.0–34.0)
MCHC: 31.2 g/dL (ref 30.0–36.0)
MCV: 89.2 fL (ref 80.0–100.0)
Platelets: 310 10*3/uL (ref 150–400)
RBC: 3.88 MIL/uL — ABNORMAL LOW (ref 4.22–5.81)
RDW: 15.6 % — ABNORMAL HIGH (ref 11.5–15.5)
WBC: 7.2 10*3/uL (ref 4.0–10.5)
nRBC: 0 % (ref 0.0–0.2)

## 2024-03-10 LAB — GLUCOSE, CAPILLARY
Glucose-Capillary: 131 mg/dL — ABNORMAL HIGH (ref 70–99)
Glucose-Capillary: 163 mg/dL — ABNORMAL HIGH (ref 70–99)
Glucose-Capillary: 177 mg/dL — ABNORMAL HIGH (ref 70–99)
Glucose-Capillary: 193 mg/dL — ABNORMAL HIGH (ref 70–99)

## 2024-03-10 LAB — BASIC METABOLIC PANEL WITH GFR
Anion gap: 8 (ref 5–15)
BUN: 31 mg/dL — ABNORMAL HIGH (ref 8–23)
CO2: 21 mmol/L — ABNORMAL LOW (ref 22–32)
Calcium: 8.5 mg/dL — ABNORMAL LOW (ref 8.9–10.3)
Chloride: 112 mmol/L — ABNORMAL HIGH (ref 98–111)
Creatinine, Ser: 1.79 mg/dL — ABNORMAL HIGH (ref 0.61–1.24)
GFR, Estimated: 40 mL/min — ABNORMAL LOW (ref >60)
Glucose, Bld: 144 mg/dL — ABNORMAL HIGH (ref 70–99)
Potassium: 4 mmol/L (ref 3.5–5.1)
Sodium: 141 mmol/L (ref 135–145)

## 2024-03-10 MED ORDER — BISACODYL 5 MG PO TBEC
10.0000 mg | DELAYED_RELEASE_TABLET | Freq: Once | ORAL | Status: AC
  Administered 2024-03-10: 10 mg via ORAL
  Filled 2024-03-10: qty 2

## 2024-03-10 NOTE — Hospital Course (Addendum)
 Raymond Gordon was admitted to the hospital with the working diagnosis of atrial fibrillation with RVR complicated with heart failure exacerbation,   73 y.o. male with medical history significant of chronic diastolic heart failure, atrial fibrillation, type 2 diabetes, hypertension, hyperlipidemia, obesity, chronic normocytic anemia, severe OSA, BPH presenting to the ED with dyspnea. Reported worsening symptoms for the last 24 hrs prior to admission, to he point where he had dyspnea with minimal efforts and not able to ambulate. EMS was called and he was found in atrial fibrillation with rapid ventricular response, not able to place IV access, he was given supplemental 02 per [place] and was brought to the hospital.  On his initial physical examination his heart rate was 160 bpm, blood pressure 106/91, RR 28 and 02 saturation 97%. Lungs with no wheezing, but decreased breath sounds, heart with S1 and S2 present, irregularly irregular with no gallops, abdomen protuberant with no distention, positive lower extremity edema ++ pitting with features of lymphedema.    Na 141, K 3,7 Cl 112 bicarbonate 20 glucose 245 bun 10 cr 0,84  AST 14 ALT 12  BNP 257  Wbc 7,2 hgb 12,0 plt 295   Chest radiograph with hypoinflation, with positive cardiomegaly, bilateral hilar vascular congestion and bilateral central interstitial infiltrates with no effusions.   EKG 161 bpm, normal axis, qtc 432, normal intervals, atrial fibrillation rhythm with ST depression V2 to V5, with no T wave changes.   Patient was placed on IV diltiazem  for anticoagulation and IV furosemide  for diuresis.   04/16 urinary retention, placed foley cathter  04/17 clinically improved, pending transfer to SNF.  04/18 patient medically stable for discharge to SNF.

## 2024-03-10 NOTE — Evaluation (Signed)
 Occupational Therapy Evaluation Patient Details Name: Raymond Gordon MRN: [id number] DOB: Jun 09, 1951 Today's Date: 03/10/2024   History of Present Illness   Mr. Raymond Gordon is a 73 yr old male admitted to the hospital 03-08-24 with shortness of breath. He was found to have a fib with RVR. PMH: chronic diastolic heart failure, a fib, DM II, HTN, HLD, OSA, B LE edema     Clinical Impressions The pt is currently presenting well below his baseline level of functioning for self-care management, as he is limited by the below listed deficits (see OT problem list). At current, he requires min-mod assist for bed mobility, total assist to donn/doff his socks, and min assist to stand from an elevated bed surface using a RW. Once in standing, he reported feelings of B LE weakness, and therefore add difficulty lifting and advancing his legs, in order to take lateral steps along the edge of the bed. He also appears to be with general deconditioning. He will benefit from further OT services to maximize his independence with self-care tasks and to decrease the risk for further weakness and deconditioning. Patient will benefit from continued inpatient follow up therapy, <3 hours/day.        If plan is discharge home, recommend the following:   A lot of help with bathing/dressing/bathroom;A lot of help with walking and/or transfers;Help with stairs or ramp for entrance;Assistance with cooking/housework;Assist for transportation     Functional Status Assessment   Patient has had a recent decline in their functional status and demonstrates the ability to make significant improvements in function in a reasonable and predictable amount of time.     Equipment Recommendations   Other (comment) (defer to next level of care)     Recommendations for Other Services         Precautions/Restrictions   Precautions Precautions: Fall Restrictions Weight Bearing Restrictions Per Provider Order: No      Mobility Bed Mobility Overal bed mobility: Needs Assistance Bed Mobility: Supine to Sit, Sit to Supine, Rolling Rolling: Mod assist   Supine to sit: Used rails, HOB elevated, Min assist Sit to supine: Min assist, Used rails   General bed mobility comments: required increased time and effort for supine to sit and sit to supine    Transfers Overall transfer level: Needs assistance Equipment used: Rolling walker (2 wheels) Transfers: Sit to/from Stand Sit to Stand: Min assist, From elevated surface           General transfer comment: Once in standing, he reported feelings of increased B LE weakness. He subsequently had difficulty lifting and advancing his B LE, therefore needing mod assist to take lateral steps along the edge of the bed      Balance     Sitting balance-Leahy Scale: Fair       Standing balance-Leahy Scale: Poor         ADL either performed or assessed with clinical judgement   ADL Overall ADL's : Needs assistance/impaired Eating/Feeding: Set up;Sitting   Grooming: Set up;Sitting           Upper Body Dressing : Minimal assistance;Bed level Upper Body Dressing Details (indicate cue type and reason): He was assisted with doffing a hospital gown, then donning another clean one while in bed. Lower Body Dressing: Total assistance Lower Body Dressing Details (indicate cue type and reason): Pt was unable to doff or donn his socks seated EOB.     Toileting- Clothing Manipulation and Hygiene: Maximal assistance;Sit to/from stand Toileting - [employer] fast streamer  Manipulation Details (indicate cue type and reason): at bedside commode level, based on clinical judgement             Pertinent Vitals/Pain Pain Assessment Pain Assessment: No/denies pain     Extremity/Trunk Assessment Upper Extremity Assessment Upper Extremity Assessment: Right hand dominant (B UE AROM WFL. B UE grip strength 4/5)   Lower Extremity Assessment Lower Extremity Assessment:  Generalized weakness       Communication Communication Communication: Impaired Factors Affecting Communication: Reduced clarity of speech   Cognition Arousal: Alert Behavior During Therapy: Flat affect        OT - Cognition Comments: Oriented x4, able to follow simple commands                                    Home Living Family/patient expects to be discharged to:: Private residence Living Arrangements: Alone Available Help at Discharge: Family;Friend(s);Available PRN/intermittently Type of Home: House Home Access: Stairs to enter [employer] of Steps: 3 Entrance Stairs-Rails:  (unilateral) Home Layout: One level     Bathroom Shower/Tub: Walk-in shower;Tub/shower unit         Home Equipment: Rollator (4 wheels);Cane - single Librarian, academic (2 wheels);Shower seat   Additional Comments: Pt's sister was present and assisted with providing information.      Prior Functioning/Environment Prior Level of Function : Independent/Modified Independent             Mobility Comments: He used a cane vs. rollator inside the home, and cane when outside the home. ADLs Comments: He was independent with ADLs and cooking. His neighbor performed the cleaning and the pt does not drive.    OT Problem List: Decreased strength;Decreased activity tolerance;Impaired balance (sitting and/or standing);Decreased knowledge of use of DME or AE;Increased edema   OT Treatment/Interventions: Self-care/ADL training;Therapeutic exercise;Energy conservation;DME and/or AE instruction;Patient/family education;Balance training;Therapeutic activities      OT Goals(Current goals can be found in the care plan section)   Acute Rehab OT Goals Patient Stated Goal: to get better OT Goal Formulation: With patient Time For Goal Achievement: 03/24/24 Potential to Achieve Goals: Good ADL Goals Pt Will Perform Upper Body Dressing: sitting;with set-up Pt Will Perform Lower  Body Dressing: with contact guard assist;sit to/from stand;sitting/lateral leans Pt Will Transfer to Toilet: with contact guard assist;ambulating;grab bars Pt Will Perform Toileting - Clothing Manipulation and hygiene: with contact guard assist;sit to/from stand   OT Frequency:  Min 2X/week       AM-PAC OT "6 Clicks" Daily Activity     Outcome Measure Help from another person eating meals?: A Little Help from another person taking care of personal grooming?: A Little Help from another person toileting, which includes using toliet, bedpan, or urinal?: A Lot Help from another person bathing (including washing, rinsing, drying)?: A Lot Help from another person to put on and taking off regular upper body clothing?: A Little Help from another person to put on and taking off regular lower body clothing?: Total 6 Click Score: 14   End of Session Equipment Utilized During Treatment: Gait belt;Rolling walker (2 wheels);Oxygen Nurse Communication: Mobility status  Activity Tolerance: Other (comment) (Fair tolerance) Patient left: in bed;with call bell/phone within reach;with bed alarm set;with family/visitor present  OT Visit Diagnosis: Unsteadiness on feet (R26.81);Muscle weakness (generalized) (M62.81)                Time: 1001-1017 OT Time Calculation (min): 16 min  Charges:  OT General Charges $OT Visit: 1 Visit OT Evaluation $OT Eval Moderate Complexity: 1 Mod   Matilyn Fehrman L Makendra Vigeant, OTR/L 03/10/2024, 11:26 AM

## 2024-03-10 NOTE — [place] FL2 (Signed)
 [place] MEDICAID FL2 LEVEL OF CARE FORM     IDENTIFICATION  Patient Name: Raymond Gordon Birthdate: 1951/06/19 Sex: male Admission Date (Current Location): 03/08/2024  [facility] and [place] Number:  Producer, television/film/video and Address:  [facility] [place], [address] [place], [name] [address]      Provider Number: [id number]  Attending Physician Name and Address:  Coralie Keens  Relative Name and Phone Number:       Current Level of Care: Hospital Recommended Level of Care: Skilled Nursing Facility Prior Approval Number:    Date Approved/Denied:   PASRR Number: [id number]  Discharge Plan: SNF    Current Diagnoses: Patient Active Problem List   Diagnosis Date Noted   Acute on chronic heart failure with preserved ejection fraction (HCC) 03/08/2024   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   Pain due to onychomycosis of toenails of both feet 04/12/2021   Body mass index (BMI) 40.0-44.9, adult (HCC) 04/11/2021   Hyperglycemia due to type 2 diabetes mellitus (HCC) 04/11/2021   Vitreomacular adhesion of both eyes 04/03/2021   Moderate nonproliferative diabetic retinopathy of both eyes without macular edema associated with type 2 diabetes mellitus (HCC) 04/03/2021   Posterior capsular opacification, right eye 04/03/2021   Posterior capsular opacification, left 04/03/2021   Thrombophilia (HCC) 04/10/2020   Chronic anticoagulation 12/19/2016   Encounter for general adult medical examination without abnormal findings 02/26/2016   Chronic diastolic heart failure (HCC) 09/13/2015   Angina pectoris (HCC) 09/12/2015   Bacteriuria, asymptomatic 08/19/2015   Acute diastolic CHF (congestive heart failure) (HCC) 08/13/2015   Acute respiratory failure with hypoxia (HCC) 08/13/2015   Uncontrolled diabetes mellitus 08/13/2015   Atrial fibrillation (HCC) 08/13/2015   Atrial fibrillation with RVR (HCC) 07/08/2015   Acute urinary obstruction 07/08/2015   Prolonged  Q-T interval on ECG 07/08/2015   Severe OSA (obstructive sleep apnea) 06/15/2015   Bilateral lower extremity edema 06/15/2015   Edema 03/13/2015   Pulmonary edema 05/20/2014   Hypoglycemia 05/20/2014   Syncope and collapse 05/20/2014   CHF (congestive heart failure), NYHA class I (HCC) 05/20/2014   Paroxysmal A-fib (HCC) 11/30/2013   Morbid obesity (HCC) 11/30/2013   Essential hypertension 11/30/2013   HLD (hyperlipidemia) 11/30/2013   Diabetes (HCC) 11/30/2013   Cardiomegaly 02/16/2013   Elevated PSA 02/16/2013   Gout 12/04/2009   Pure hypercholesterolemia 10/28/2009    Orientation RESPIRATION BLADDER Height & Weight     Self, Time, Situation, Place  O2 (2.5L O2 [place]) Incontinent, External catheter Weight: 279 lb 1.6 oz (126.6 kg) Height:  5\' 4"  (162.6 cm)  BEHAVIORAL SYMPTOMS/MOOD NEUROLOGICAL BOWEL NUTRITION STATUS      Continent Diet (see dc summary)  AMBULATORY STATUS COMMUNICATION OF NEEDS Skin   Extensive Assist Verbally Normal                       Personal Care Assistance Level of Assistance  Bathing, Feeding, Dressing Bathing Assistance: Limited assistance Feeding assistance: Limited assistance Dressing Assistance: Maximum assistance     Functional Limitations Info  Sight, Hearing, Speech Sight Info: Adequate Hearing Info: Adequate Speech Info: Adequate    SPECIAL CARE FACTORS FREQUENCY  PT (By licensed PT), OT (By licensed OT)     PT Frequency: 5x week OT Frequency: 5x week            Contractures Contractures Info: Not present    Additional Factors Info  Code Status Code Status Info: Full  Current Medications (03/10/2024):  This is the current hospital active medication list Current Facility-Administered Medications  Medication Dose Route Frequency Provider Last Rate Last Admin   acetaminophen (TYLENOL) tablet 650 mg  650 mg Oral Q6H PRN Jinwala, Sagar H, MD       Or   acetaminophen (TYLENOL) suppository 650 mg  650 mg  Rectal Q6H PRN Jinwala, Sagar H, MD       apixaban Herby Lolling) tablet 5 mg  5 mg Oral BID Jinwala, Sagar H, MD   5 mg at 03/10/24 1109   atorvastatin (LIPITOR) tablet 80 mg  80 mg Oral Daily Jinwala, Sagar H, MD   80 mg at 03/10/24 1109   finasteride (PROSCAR) tablet 5 mg  5 mg Oral Daily Jinwala, Sagar H, MD   5 mg at 03/10/24 1109   hydrALAZINE (APRESOLINE) tablet 25 mg  25 mg Oral Q8H Johnie Nailer B, NP   25 mg at 03/10/24 1303   And   isosorbide mononitrate (IMDUR) 24 hr tablet 30 mg  30 mg Oral Daily Roberts, Lindsay B, NP   30 mg at 03/10/24 1109   insulin aspart (novoLOG) injection 0-20 Units  0-20 Units Subcutaneous TID WC Jinwala, Sagar H, MD   4 Units at 03/10/24 1256   metoprolol tartrate (LOPRESSOR) tablet 25 mg  25 mg Oral BID Parcells, Taylor A, PA-C   25 mg at 03/10/24 1109   pantoprazole (PROTONIX) EC tablet 40 mg  40 mg Oral Daily Jinwala, Sagar H, MD   40 mg at 03/10/24 1109   sodium chloride flush (NS) 0.9 % injection 3-10 mL  3-10 mL Intravenous Q12H Nishan, Peter C, MD   10 mL at 03/10/24 1000   sodium chloride flush (NS) 0.9 % injection 3-10 mL  3-10 mL Intravenous PRN Nishan, Peter C, MD         Discharge Medications: Please see discharge summary for a list of discharge medications.  Relevant Imaging Results:  Relevant Lab Results:   Additional Information SSN [id number] [place], [place]CSWA

## 2024-03-10 NOTE — Progress Notes (Addendum)
  Progress Note   Patient: Raymond Gordon MVH:[id number] DOB: 11/29/1950 DOA: 03/08/2024     2 DOS: the patient was seen and examined on 03/10/2024   Brief hospital course: Mr. Victory was admitted to the hospital with the working diagnosis of atrial fibrillation with RVR complicated with heart failure exacerbation,   73 y.o. male with medical history significant of chronic diastolic heart failure, atrial fibrillation on Eliquis, type 2 diabetes, hypertension, hyperlipidemia, obesity, chronic normocytic anemia, severe OSA, BPH presenting to the ED with shortness of breath with exertion.  Patient found to have atrial fibrillation with RVR.  Concern was for acute CHF as well.  Patient was hospitalized for further management.   Assessment and Plan: * Acute on chronic diastolic CHF (congestive heart failure) (HCC) Echocardiogram with preserved LV systolic function with EF 55 to 60%, RV systolic function preserved, LA with moderate dilatation, \  Urine output 1700 ml Systolic blood pressure 140 mmHg range  Continue afterload reduction with hydralazine and isosorbide.  Continue metoprolol  Hold on loop diuretic for now.  Limited pharmacologic options due to acutely reduced GFR   Atrial fibrillation with RVR (HCC) Continue rate control with metoprolol and anticoagulation with apixaban.  Continue telemetry monitoring   Essential hypertension Continue blood pressure control with hydralazine and isosorbide.   AKI (acute kidney injury) (HCC) Worsening renal function per serum cr, today up to 1,79 with K at 4,0 and serum bicarbonate at 21  Na 141   Plan to continue close monitoring of renal function and electrolytes Follow up renal ultrasound.  Avoid hypotension and nephrotoxic medications Hold on loop diuretic for now.   Type 2 diabetes mellitus with hyperlipidemia (HCC) Continue insulin sliding scale for glucose cover and monitoring  Statin therapy   Obesity, class 3 Calculated BMI is 47.9   OSA continue with Cpap.    Subjective: Patient with constipation and having difficulty passing urine, dyspnea has improved with no chest pain   Physical Exam: Vitals:   03/10/24 0404 03/10/24 1109 03/10/24 1120 03/10/24 1555  BP: 136/61 (!) 151/57 (!) 146/56 (!) 142/63  Pulse: 65 67 67 63  Resp: 20  19 (!) 21  Temp: 98.5 F (36.9 C)  98 F (36.7 C) 98 F (36.7 C)  TempSrc: Oral  Oral Oral  SpO2: 99%  99% 97%  Weight: 126.6 kg     Height:       Neurology awake and alert ENT with mild pallor with no icterus Cardiovascular with S1 and S2 present and regular with no gallops rubs or murmurs Respiratory with mild decreased breath sounds at bases with no wheezing or rhonchi  Abdomen with no distention  Lower extremity with positive lymphedema, with no pitting  Data Reviewed:    Family Communication: no family at the bedside   Disposition: Status is: Inpatient Remains inpatient appropriate because: IV diuresis   Planned Discharge Destination: Home     Author: Albertus Alt, MD 03/10/2024 5:16 PM  For on call review [email or web address].

## 2024-03-10 NOTE — Assessment & Plan Note (Addendum)
 Calculated BMI is 47.9  OSA continue with Cpap.

## 2024-03-10 NOTE — Assessment & Plan Note (Addendum)
 Positive urinary retention, now has a foley cathter in place.  Renal ultrasound with mild right hydronephrosis.  Renal function with serum cr at 1,0 with K at 4,5 and serum bicarbonate at 22  Na 142 and Mg 2,3  Follow up renal function in 7 days as outpatient.   Urinary retention, plan to keel foley catheter at discharge and do voiding trial as outpatient in 7 days.  Continue finasteride    Ambulatory referral for Urology.

## 2024-03-10 NOTE — Assessment & Plan Note (Addendum)
 Echocardiogram with preserved LV systolic function with EF 55 to 60%, RV systolic function preserved, LA with moderate dilatation,   Patient was placed on furosemide  for diuresis, negative fluid balance was achieved, - 3,567 ml, with significant improvement in his symptoms.   Systolic blood pressure 140 mmHg range  Continue afterload reduction with isosorbide .  Continue metoprolol  and will resume losartan  plus spironolactone .  Resume furosemide  80 mg po daily. Hold on SGLT 2 inh due to urinary retention and high risk for urinary tract infections.

## 2024-03-10 NOTE — Assessment & Plan Note (Addendum)
 Patient was placed on insulin  sliding scale for glucose cover and monitoring during his hospitalization.  He will continue insulin  therapy at discharge.   Statin therapy

## 2024-03-10 NOTE — Progress Notes (Signed)
 Pt in and out cath at 2110 output 950 ml. No urine output the whole night. Bladder scan at 0612 480 ml. In and out cath done with output 750 ml. Dr. [facility] notified and requested a repeat in 3 hours and notify day MD.

## 2024-03-10 NOTE — Assessment & Plan Note (Addendum)
 Continue blood pressure control with isosorbide .  Will resume losartan  plus spironolactone  and for now will continue to hold on amlodipine .

## 2024-03-10 NOTE — TOC Progression Note (Addendum)
 Transition of Care [facility]) - Progression Note    Patient Details  Name: Raymond Gordon MRN: [id number] Date of Birth: Feb 26, 1951  [facility]) CM/SW Contact  Arron Big, [place] Phone Number: 03/10/2024, 1:22 PM  Clinical Narrative:   CSW introduced self/role, and spoke with patient/sister Raymond Gordon) at bedside to discuss PT recs for SNF. Sister reported patient has previously been to [facility]. They would like CSW to submit referrals to multiple facilities. Geraldine gave CSW her email to send accepting bed offers to - [email or web address]   3:25 PM CSW provided patient with medicare.gov bed ratings for SNF. CSW emailed list to patients sister.   TOC will continue to follow.    Expected Discharge Plan: Skilled Nursing Facility Barriers to Discharge: Continued Medical Work up, SNF Pending bed offer, English as a second language teacher  Expected Discharge Plan and Services In-house Referral: Clinical Social Work     Living arrangements for the past 2 months: Single Family Home                                       Social Determinants of Health (SDOH) Interventions SDOH Screenings   Food Insecurity: No Food Insecurity (03/09/2024)  Housing: Low Risk  (03/09/2024)  Transportation Needs: No Transportation Needs (03/09/2024)  Utilities: Not At Risk (03/09/2024)  Social Connections: Moderately Integrated (03/09/2024)  Tobacco Use: Medium Risk (03/08/2024)    Readmission Risk Interventions     No data to display

## 2024-03-10 NOTE — Progress Notes (Addendum)
 Patient Name: Raymond Gordon Date of Encounter: 03/10/2024 [facility] Cardiologist: Nanetta Batty, MD   Interval Summary  .    He reports no chest pain or shortness of breath.  In speaking with the nurse he has had very poor urinary output unless he gets a in and out catheterization.    Vital Signs .    Vitals:   03/09/24 2008 03/09/24 2320 03/10/24 0107 03/10/24 0404  BP: (!) 154/55  (!) 155/71 136/61  Pulse: 68  68 65  Resp: 20 18 20 20   Temp: 98.5 F (36.9 C)  98.2 F (36.8 C) 98.5 F (36.9 C)  TempSrc: Oral  Oral Oral  SpO2: 98% 98% 100% 99%  Weight:    126.6 kg  Height:        Intake/Output Summary (Last 24 hours) at 03/10/2024 0920 Last data filed at 03/10/2024 0900 Gross per 24 hour  Intake 660 ml  Output 1700 ml  Net -1040 ml      03/10/2024    4:04 AM 03/09/2024    5:00 AM 03/08/2024    9:41 AM  Last 3 Weights  Weight (lbs) 279 lb 1.6 oz 285 lb 11.5 oz 277 lb 5.4 oz  Weight (kg) 126.6 kg 129.6 kg 125.8 kg      Telemetry/ECG    Still maintaining NSR, heart rates in the 60s- Personally Reviewed  CV Studies    Echocardiogram 03/09/2024 1. Left ventricular ejection fraction, by estimation, is 55 to 60%. The  left ventricle has normal function. Left ventricular endocardial border  not optimally defined to evaluate regional wall motion. Left ventricular  diastolic parameters were normal.   2. Right ventricular systolic function is normal. The right ventricular  size is normal.   3. Left atrial size was moderately dilated.   4. The mitral valve is normal in structure. No evidence of mitral valve  regurgitation. No evidence of mitral stenosis.   5. The aortic valve was not well visualized. Aortic valve regurgitation  is not visualized. No aortic stenosis is present.   6. The inferior vena cava is normal in size with greater than 50%  respiratory variability, suggesting right atrial pressure of 3 mmHg.    Physical Exam .   GEN: No acute  distress.   Neck: Difficult to assess JVD Cardiac: RRR, no murmurs, rubs, or gallops.  Respiratory: Slight crackles GI: Soft, nontender, non-distended  MS: 1+ edema, very dry and chronic skin changes  Patient Profile    Raymond Gordon is a 73 y.o. male has hx of paroxysmal atrial fibrillation, chronic HFpEF, hypertension, hyperlipidemia, type 2 diabetes, OSA on CPAP.  Patient was admitted for A-fib RVR.  Assessment & Plan .     PAF with RVR Initially presented with EMS with heart rates in the 200s.  Placed on diltiazem drip and had 4.6-second conversion pause.  He has been maintaining NSR for the last 2 days.  Episode likely exacerbated by noncompliance with CPAP. Continue with Eliquis 5 mg twice daily, Lopressor 25 mg twice daily TSH was normal. LA is moderately dilated.  Acute on chronic HFpEF Likely related to RVR, his shortness of breath has improved.  He likely has chronic lower extremity edema that might be related to lymphedema.  He has been diuresed on IV Lasix 40 mg twice daily with rising creatinine previously normal now 1.79 in the setting of urinary retention.  Mildly volume up today, but reports no shortness of breath or orthopnea.  IVC  also normal yesterday.  -1.7 L For now hold further diuretics, stop IV Lasix 40 mg twice daily. Continue with hydralazine 25 mg p.o. 3 times daily and Imdur 30 mg. Echocardiogram this admission with preserved biventricular function.  Hypertension Pressures have been soft and sometimes labile.  Need to monitor On medications as above.  Hyperlipidemia Continue atorvastatin 80 mg.  LDL 96 yesterday.  Severe OSA on CPAP Sister reports that he has not been compliant with this.  Stressed the importance of this and how this is a significant risk factor to his atrial fibrillation.  Urinary retention He does not urinate unless being catheterized.  With his rising creatinine levels and dark brown urine this is concerning.  Would recommend  evaluation by urology/possibly nephrology.  Also with his history of BPH.  May consider renal ultrasound.  Diabetes A1c 7.9%   For questions or updates, please contact [facility] Please consult [email or web address] for contact info under        Signed, Burnetta Cart, PA-C

## 2024-03-10 NOTE — Progress Notes (Signed)
 Pt continues to have urinary retention during this shift. This RN preformed a In and out Cath and removed 900 mLs of urine mixed with blood and small clots.

## 2024-03-10 NOTE — Plan of Care (Signed)
  Problem: Education: Goal: Ability to describe self-care measures that may prevent or decrease complications (Diabetes Survival Skills Education) will improve Outcome: Progressing   Problem: Fluid Volume: Goal: Ability to maintain a balanced intake and output will improve Outcome: Progressing   Problem: Skin Integrity: Goal: Risk for impaired skin integrity will decrease Outcome: Progressing   Problem: Safety: Goal: Ability to remain free from injury will improve Outcome: Progressing   Problem: Cardiac: Goal: Ability to achieve and maintain adequate cardiopulmonary perfusion will improve Outcome: Progressing   Problem: Health Behavior/Discharge Planning: Goal: Ability to safely manage health-related needs after discharge will improve Outcome: Progressing

## 2024-03-10 NOTE — Assessment & Plan Note (Addendum)
 Continue rate control with metoprolol  and anticoagulation with apixaban .  Patient has been sinus rhythm.

## 2024-03-11 DIAGNOSIS — N179 Acute kidney failure, unspecified: Secondary | ICD-10-CM

## 2024-03-11 DIAGNOSIS — I4891 Unspecified atrial fibrillation: Secondary | ICD-10-CM | POA: Diagnosis not present

## 2024-03-11 DIAGNOSIS — I5033 Acute on chronic diastolic (congestive) heart failure: Secondary | ICD-10-CM | POA: Diagnosis not present

## 2024-03-11 DIAGNOSIS — I10 Essential (primary) hypertension: Secondary | ICD-10-CM | POA: Diagnosis not present

## 2024-03-11 LAB — BASIC METABOLIC PANEL WITH GFR
Anion gap: 8 (ref 5–15)
BUN: 31 mg/dL — ABNORMAL HIGH (ref 8–23)
CO2: 23 mmol/L (ref 22–32)
Calcium: 8.5 mg/dL — ABNORMAL LOW (ref 8.9–10.3)
Chloride: 111 mmol/L (ref 98–111)
Creatinine, Ser: 1.24 mg/dL (ref 0.61–1.24)
GFR, Estimated: >60 mL/min (ref >60)
Glucose, Bld: 178 mg/dL — ABNORMAL HIGH (ref 70–99)
Potassium: 4 mmol/L (ref 3.5–5.1)
Sodium: 142 mmol/L (ref 135–145)

## 2024-03-11 LAB — GLUCOSE, CAPILLARY
Glucose-Capillary: 165 mg/dL — ABNORMAL HIGH (ref 70–99)
Glucose-Capillary: 155 mg/dL — ABNORMAL HIGH (ref 70–99)
Glucose-Capillary: 185 mg/dL — ABNORMAL HIGH (ref 70–99)
Glucose-Capillary: 200 mg/dL — ABNORMAL HIGH (ref 70–99)

## 2024-03-11 LAB — MAGNESIUM: Magnesium: 2.3 mg/dL (ref 1.7–2.4)

## 2024-03-11 MED ORDER — CHLORHEXIDINE GLUCONATE CLOTH 2 % EX PADS
6.0000 | MEDICATED_PAD | Freq: Every day | CUTANEOUS | Status: DC
  Administered 2024-03-12: 6 via TOPICAL

## 2024-03-11 NOTE — TOC Progression Note (Signed)
 Transition of Care [facility]) - Progression Note    Patient Details  Name: Raymond Gordon MRN: [id number] Date of Birth: 17-May-1951  Transition of Care [facility]) CM/SW Contact  Arron Big, [place] Phone Number: 03/11/2024, 2:37 PM  Clinical Narrative:   CSW spoke with patient and his sister, [name], about choice of SNF. Per [name], she is trying to choose between [facility] and [facility].   2:12 PM CSW called Lenon Radar and Geraldine, Joyce prefers [facility] but defers the choice to [place] since she's in [place]. [name] will speak with her sister and states she is going to look at google reviews. CSW will follow up with family regarding bed choice.   TOC will continue to follow.    Expected Discharge Plan: Skilled Nursing Facility Barriers to Discharge: Continued Medical Work up, Other (must enter comment), English as a second language teacher  Expected Discharge Plan and Services In-house Referral: Clinical Social Work     Living arrangements for the past 2 months: Single Family Home                                       Social Determinants of Health (SDOH) Interventions SDOH Screenings   Food Insecurity: No Food Insecurity (03/09/2024)  Housing: Low Risk  (03/09/2024)  Transportation Needs: No Transportation Needs (03/09/2024)  Utilities: Not At Risk (03/09/2024)  Social Connections: Moderately Integrated (03/09/2024)  Tobacco Use: Medium Risk (03/08/2024)    Readmission Risk Interventions     No data to display

## 2024-03-11 NOTE — Progress Notes (Signed)
 Physical Therapy Treatment Patient Details Name: Raymond Gordon MRN: [id number] DOB: January 11, 1951 Today's Date: 03/11/2024   History of Present Illness Raymond Gordon is a 73 yr old male admitted to the hospital 03-08-24 with shortness of breath. He was found to have a fib with RVR. PMH: chronic diastolic heart failure, a fib, DM II, HTN, HLD, OSA, B LE edema    PT Comments  Pt resting in bed on arrival and agreeable to session with steady progress towards acute goals. Pt continues to be limited in safe mobility by LE edema, limited weight bearing tolerance and ROM, decreased activity tolerance and poor balance/postural reactions. Pt able to progress short in room gait with session with RW and min A to maintain balance. Pt needing max cues during step-pivot transfer EOB>chair for safe hand placement and up to mod A to maintain balance and manage RW. Pt up in chair at end of session. Patient will benefit from continued inpatient follow up therapy, <3 hours/day, will continue to follow acutely.    If plan is discharge home, recommend the following: A lot of help with walking and/or transfers;A lot of help with bathing/dressing/bathroom;Assistance with cooking/housework;Assist for transportation;Help with stairs or ramp for entrance;Supervision due to cognitive status   Can travel by private vehicle     No  Equipment Recommendations   (TBD)    Recommendations for Other Services       Precautions / Restrictions Precautions Precautions: Fall Restrictions Weight Bearing Restrictions Per Provider Order: No     Mobility  Bed Mobility Overal bed mobility: Needs Assistance Bed Mobility: Supine to Sit, Sit to Supine, Rolling Rolling: Mod assist   Supine to sit: Used rails, HOB elevated, Min assist     General bed mobility comments: modA to move LE to EOB and to elevate trunk    Transfers Overall transfer level: Needs assistance Equipment used: Rolling walker (2 wheels) Transfers: Sit to/from  Stand, Bed to chair/wheelchair/BSC Sit to Stand: Mod assist   Step pivot transfers: Mod assist       General transfer comment: Pt needing max cues for hand placement on rise and to maintain hands on walker during transfer. Pt needing modA on boost to stand for stabillity, flexed trunk on rise    Ambulation/Gait Ambulation/Gait assistance: Min assist Gait Distance (Feet): 5 Feet Assistive device: Rolling walker (2 wheels) Gait Pattern/deviations: Step-to pattern, Decreased step length - right, Decreased step length - left, Decreased stride length, Decreased dorsiflexion - right, Decreased dorsiflexion - left Gait velocity: decreased     General Gait Details: pt limited due to fatigue and weakness with very low effortful steps   Stairs             Wheelchair Mobility     Tilt Bed    Modified Rankin (Stroke Patients Only)       Balance Overall balance assessment: Needs assistance Sitting-balance support: Feet supported, Bilateral upper extremity supported Sitting balance-Leahy Scale: Fair Sitting balance - Comments: no LOB   Standing balance support: Bilateral upper extremity supported, During functional activity, Reliant on assistive device for balance Standing balance-Leahy Scale: Poor Standing balance comment: dependent on RW                            Communication Communication Communication: Impaired Factors Affecting Communication: Reduced clarity of speech  Cognition Arousal: Alert Behavior During Therapy: Flat affect   PT - Cognitive impairments: No family/caregiver present to determine baseline  PT - Cognition Comments: pt with muffled/garbled speech Following commands: Intact      Cueing Cueing Techniques: Verbal cues, Tactile cues  Exercises General Exercises - Lower Extremity Long Arc Quad: AROM, Right, Left, 10 reps, Seated Hip Flexion/Marching: AROM, Right, Left, 10 reps, Seated    General  Comments General comments (skin integrity, edema, etc.): SPO2 89-95% on RA with moblity, some shortness of breath with activity      Pertinent Vitals/Pain      Home Living                          Prior Function            PT Goals (current goals can now be found in the care plan section) Acute Rehab PT Goals Patient Stated Goal: go home to his his dog PT Goal Formulation: With patient Time For Goal Achievement: 03/23/24 Progress towards PT goals: Progressing toward goals    Frequency    Min 2X/week      PT Plan      Co-evaluation              AM-PAC PT "6 Clicks" Mobility   Outcome Measure  Help needed turning from your back to your side while in a flat bed without using bedrails?: A Lot Help needed moving from lying on your back to sitting on the side of a flat bed without using bedrails?: A Lot Help needed moving to and from a bed to a chair (including a wheelchair)?: A Lot Help needed standing up from a chair using your arms (e.g., wheelchair or bedside chair)?: A Lot Help needed to walk in hospital room?: A Lot Help needed climbing 3-5 steps with a railing? : Total 6 Click Score: 11    End of Session Equipment Utilized During Treatment: Gait belt Activity Tolerance: Patient limited by fatigue Patient left: in chair;with call bell/phone within reach Nurse Communication: Mobility status PT Visit Diagnosis: Unsteadiness on feet (R26.81);Muscle weakness (generalized) (M62.81);Difficulty in walking, not elsewhere classified (R26.2)     Time: 0102-7253 PT Time Calculation (min) (ACUTE ONLY): 18 min  Charges:    $Gait Training: 8-22 mins PT General Charges $$ ACUTE PT VISIT: 1 Visit                     Bartholomew Ramesh R. PTA Acute Rehabilitation Services Office: [phone]   Agapito Horseman 03/11/2024, 4:28 PM

## 2024-03-11 NOTE — Progress Notes (Signed)
 Heart Failure Navigator Progress Note  Assessed for Heart & Vascular TOC clinic readiness.  Patient does not meet criteria due to EF 55-60%, has a scheduled [facility] appointment on 03/30/2024. .   Navigator will sign off at this time. Randie Bustle, BSN, RN Heart Failure Teacher, adult education Only

## 2024-03-11 NOTE — Progress Notes (Signed)
  Progress Note   Patient: Raymond Gordon WJX:[id number] DOB: 13-Aug-1951 DOA: 03/08/2024     3 DOS: the patient was seen and examined on 03/11/2024   Brief hospital course: Mr. Raymond Gordon was admitted to the hospital with the working diagnosis of atrial fibrillation with RVR complicated with heart failure exacerbation,   73 y.o. male with medical history significant of chronic diastolic heart failure, atrial fibrillation, type 2 diabetes, hypertension, hyperlipidemia, obesity, chronic normocytic anemia, severe OSA, BPH presenting to the ED with dyspnea.     Patient found to have atrial fibrillation with RVR.  Concern was for acute CHF as well.  Patient was hospitalized for further management.   04/16 urinary retention, placed foley cathter  04/17 clinically improved, pending transfer to SNF.   Assessment and Plan: * Acute on chronic diastolic CHF (congestive heart failure) (HCC) Echocardiogram with preserved LV systolic function with EF 55 to 60%, RV systolic function preserved, LA with moderate dilatation, \  Urine output 2,195  ml Systolic blood pressure 140 mmHg range  Continue afterload reduction with hydralazine and isosorbide.  Continue metoprolol  Hold on loop diuretic for now, post obstruction diuresis.  Limited pharmacologic options due to acutely reduced GFR   Atrial fibrillation with RVR (HCC) Continue rate control with metoprolol and anticoagulation with apixaban.  Continue telemetry monitoring   Essential hypertension Continue blood pressure control with hydralazine and isosorbide.   AKI (acute kidney injury) (HCC) Positive urinary retention, now has a foley cathter in place.  Renal ultrasound with mild right hydronephrosis. Renal function with serum cr at 1,24 with K at 4,0 and serum bicarbonate at 23  Na 142 Mg 2.3  Continue close follow up renal function and electrolytes. Hold on diuretic for now, post obstructive diuresis.   Type 2 diabetes mellitus with  hyperlipidemia (HCC) Continue insulin sliding scale for glucose cover and monitoring  Statin therapy   Obesity, class 3 Calculated BMI is 47.9  OSA continue with Cpap.       Subjective: Patient is feeling better, foley catheter has been placed, no chest pain or dyspnea   Physical Exam: Vitals:   03/11/24 0409 03/11/24 0410 03/11/24 0735 03/11/24 1051  BP:   (!) 148/55 (!) 147/63  Pulse: (!) 54 (!) 54 (!) 56 (!) 53  Resp: (!) 23 18 19 17   Temp:   98.4 F (36.9 C) 98.6 F (37 C)  TempSrc:   Oral Oral  SpO2: 100% 100% 100% 100%  Weight: 127.2 kg     Height:       Neurology awake and alert ENT with no pallor or icterus Cardiovascular with S1 and S2 present and regular with no gallops, or rubs Respiratory with poor inspiratory effort with no wheezing or rhonchi, no rales Abdomen with no distention  Lower extremity edema mild to moderate with no pitting, positive lymphedema  Data Reviewed:    Family Communication: no family at the bedside   Disposition: Status is: Inpatient Remains inpatient appropriate because: pending transfer to SNF   Planned Discharge Destination: Skilled nursing facility    Author: Albertus Alt, MD 03/11/2024 3:41 PM  For on call review [email or web address].

## 2024-03-12 DIAGNOSIS — Z794 Long term (current) use of insulin: Secondary | ICD-10-CM | POA: Diagnosis not present

## 2024-03-12 DIAGNOSIS — N401 Enlarged prostate with lower urinary tract symptoms: Secondary | ICD-10-CM | POA: Diagnosis not present

## 2024-03-12 DIAGNOSIS — R2689 Other abnormalities of gait and mobility: Secondary | ICD-10-CM | POA: Diagnosis not present

## 2024-03-12 DIAGNOSIS — Z7901 Long term (current) use of anticoagulants: Secondary | ICD-10-CM | POA: Diagnosis not present

## 2024-03-12 DIAGNOSIS — I4891 Unspecified atrial fibrillation: Secondary | ICD-10-CM | POA: Diagnosis not present

## 2024-03-12 DIAGNOSIS — E66813 Obesity, class 3: Secondary | ICD-10-CM | POA: Diagnosis not present

## 2024-03-12 DIAGNOSIS — N400 Enlarged prostate without lower urinary tract symptoms: Secondary | ICD-10-CM | POA: Diagnosis not present

## 2024-03-12 DIAGNOSIS — Z7189 Other specified counseling: Secondary | ICD-10-CM | POA: Diagnosis not present

## 2024-03-12 DIAGNOSIS — N189 Chronic kidney disease, unspecified: Secondary | ICD-10-CM | POA: Diagnosis not present

## 2024-03-12 DIAGNOSIS — I209 Angina pectoris, unspecified: Secondary | ICD-10-CM | POA: Diagnosis not present

## 2024-03-12 DIAGNOSIS — Z9981 Dependence on supplemental oxygen: Secondary | ICD-10-CM | POA: Diagnosis not present

## 2024-03-12 DIAGNOSIS — R0600 Dyspnea, unspecified: Secondary | ICD-10-CM | POA: Diagnosis not present

## 2024-03-12 DIAGNOSIS — Z978 Presence of other specified devices: Secondary | ICD-10-CM | POA: Diagnosis not present

## 2024-03-12 DIAGNOSIS — Z7409 Other reduced mobility: Secondary | ICD-10-CM | POA: Diagnosis not present

## 2024-03-12 DIAGNOSIS — N1330 Unspecified hydronephrosis: Secondary | ICD-10-CM | POA: Diagnosis not present

## 2024-03-12 DIAGNOSIS — R972 Elevated prostate specific antigen [PSA]: Secondary | ICD-10-CM | POA: Diagnosis not present

## 2024-03-12 DIAGNOSIS — E1169 Type 2 diabetes mellitus with other specified complication: Secondary | ICD-10-CM | POA: Diagnosis not present

## 2024-03-12 DIAGNOSIS — N390 Urinary tract infection, site not specified: Secondary | ICD-10-CM | POA: Diagnosis not present

## 2024-03-12 DIAGNOSIS — M109 Gout, unspecified: Secondary | ICD-10-CM | POA: Diagnosis not present

## 2024-03-12 DIAGNOSIS — I10 Essential (primary) hypertension: Secondary | ICD-10-CM | POA: Diagnosis not present

## 2024-03-12 DIAGNOSIS — E8809 Other disorders of plasma-protein metabolism, not elsewhere classified: Secondary | ICD-10-CM | POA: Diagnosis not present

## 2024-03-12 DIAGNOSIS — I890 Lymphedema, not elsewhere classified: Secondary | ICD-10-CM | POA: Diagnosis not present

## 2024-03-12 DIAGNOSIS — R338 Other retention of urine: Secondary | ICD-10-CM | POA: Diagnosis not present

## 2024-03-12 DIAGNOSIS — K219 Gastro-esophageal reflux disease without esophagitis: Secondary | ICD-10-CM | POA: Diagnosis not present

## 2024-03-12 DIAGNOSIS — N2889 Other specified disorders of kidney and ureter: Secondary | ICD-10-CM | POA: Diagnosis not present

## 2024-03-12 DIAGNOSIS — R531 Weakness: Secondary | ICD-10-CM | POA: Diagnosis not present

## 2024-03-12 DIAGNOSIS — I130 Hypertensive heart and chronic kidney disease with heart failure and stage 1 through stage 4 chronic kidney disease, or unspecified chronic kidney disease: Secondary | ICD-10-CM | POA: Diagnosis not present

## 2024-03-12 DIAGNOSIS — I517 Cardiomegaly: Secondary | ICD-10-CM | POA: Diagnosis not present

## 2024-03-12 DIAGNOSIS — Q809 Congenital ichthyosis, unspecified: Secondary | ICD-10-CM | POA: Diagnosis not present

## 2024-03-12 DIAGNOSIS — Z6841 Body Mass Index (BMI) 40.0 and over, adult: Secondary | ICD-10-CM | POA: Diagnosis not present

## 2024-03-12 DIAGNOSIS — R600 Localized edema: Secondary | ICD-10-CM | POA: Diagnosis not present

## 2024-03-12 DIAGNOSIS — R0902 Hypoxemia: Secondary | ICD-10-CM | POA: Diagnosis not present

## 2024-03-12 DIAGNOSIS — G4733 Obstructive sleep apnea (adult) (pediatric): Secondary | ICD-10-CM | POA: Diagnosis not present

## 2024-03-12 DIAGNOSIS — E785 Hyperlipidemia, unspecified: Secondary | ICD-10-CM | POA: Diagnosis not present

## 2024-03-12 DIAGNOSIS — J9601 Acute respiratory failure with hypoxia: Secondary | ICD-10-CM | POA: Diagnosis not present

## 2024-03-12 DIAGNOSIS — I5032 Chronic diastolic (congestive) heart failure: Secondary | ICD-10-CM | POA: Diagnosis not present

## 2024-03-12 DIAGNOSIS — E782 Mixed hyperlipidemia: Secondary | ICD-10-CM | POA: Diagnosis not present

## 2024-03-12 DIAGNOSIS — Z8042 Family history of malignant neoplasm of prostate: Secondary | ICD-10-CM | POA: Diagnosis not present

## 2024-03-12 DIAGNOSIS — I5033 Acute on chronic diastolic (congestive) heart failure: Secondary | ICD-10-CM | POA: Diagnosis not present

## 2024-03-12 DIAGNOSIS — I878 Other specified disorders of veins: Secondary | ICD-10-CM | POA: Diagnosis not present

## 2024-03-12 DIAGNOSIS — I480 Paroxysmal atrial fibrillation: Secondary | ICD-10-CM | POA: Diagnosis not present

## 2024-03-12 DIAGNOSIS — M6281 Muscle weakness (generalized): Secondary | ICD-10-CM | POA: Diagnosis not present

## 2024-03-12 DIAGNOSIS — E113393 Type 2 diabetes mellitus with moderate nonproliferative diabetic retinopathy without macular edema, bilateral: Secondary | ICD-10-CM | POA: Diagnosis not present

## 2024-03-12 DIAGNOSIS — R310 Gross hematuria: Secondary | ICD-10-CM | POA: Diagnosis not present

## 2024-03-12 DIAGNOSIS — R10819 Abdominal tenderness, unspecified site: Secondary | ICD-10-CM | POA: Diagnosis not present

## 2024-03-12 DIAGNOSIS — D649 Anemia, unspecified: Secondary | ICD-10-CM | POA: Diagnosis not present

## 2024-03-12 DIAGNOSIS — N130 Hydronephrosis with ureteropelvic junction obstruction: Secondary | ICD-10-CM | POA: Diagnosis not present

## 2024-03-12 DIAGNOSIS — I509 Heart failure, unspecified: Secondary | ICD-10-CM | POA: Diagnosis not present

## 2024-03-12 DIAGNOSIS — E1165 Type 2 diabetes mellitus with hyperglycemia: Secondary | ICD-10-CM | POA: Diagnosis not present

## 2024-03-12 DIAGNOSIS — N139 Obstructive and reflux uropathy, unspecified: Secondary | ICD-10-CM | POA: Diagnosis not present

## 2024-03-12 DIAGNOSIS — M1A9XX1 Chronic gout, unspecified, with tophus (tophi): Secondary | ICD-10-CM | POA: Diagnosis not present

## 2024-03-12 DIAGNOSIS — J9612 Chronic respiratory failure with hypercapnia: Secondary | ICD-10-CM | POA: Diagnosis not present

## 2024-03-12 DIAGNOSIS — N179 Acute kidney failure, unspecified: Secondary | ICD-10-CM | POA: Diagnosis not present

## 2024-03-12 DIAGNOSIS — R4189 Other symptoms and signs involving cognitive functions and awareness: Secondary | ICD-10-CM | POA: Diagnosis not present

## 2024-03-12 DIAGNOSIS — R339 Retention of urine, unspecified: Secondary | ICD-10-CM | POA: Diagnosis not present

## 2024-03-12 DIAGNOSIS — Z7902 Long term (current) use of antithrombotics/antiplatelets: Secondary | ICD-10-CM | POA: Diagnosis not present

## 2024-03-12 DIAGNOSIS — R8271 Bacteriuria: Secondary | ICD-10-CM | POA: Diagnosis not present

## 2024-03-12 LAB — MAGNESIUM: Magnesium: 2.3 mg/dL (ref 1.7–2.4)

## 2024-03-12 LAB — BASIC METABOLIC PANEL WITH GFR
Anion gap: 7 (ref 5–15)
BUN: 26 mg/dL — ABNORMAL HIGH (ref 8–23)
CO2: 22 mmol/L (ref 22–32)
Calcium: 8.9 mg/dL (ref 8.9–10.3)
Chloride: 113 mmol/L — ABNORMAL HIGH (ref 98–111)
Creatinine, Ser: 1.07 mg/dL (ref 0.61–1.24)
GFR, Estimated: >60 mL/min (ref >60)
Glucose, Bld: 163 mg/dL — ABNORMAL HIGH (ref 70–99)
Potassium: 4.5 mmol/L (ref 3.5–5.1)
Sodium: 142 mmol/L (ref 135–145)

## 2024-03-12 LAB — GLUCOSE, CAPILLARY
Glucose-Capillary: 148 mg/dL — ABNORMAL HIGH (ref 70–99)
Glucose-Capillary: 185 mg/dL — ABNORMAL HIGH (ref 70–99)
Glucose-Capillary: 199 mg/dL — ABNORMAL HIGH (ref 70–99)

## 2024-03-12 MED ORDER — INSULIN ASPART 100 UNIT/ML IJ SOLN: 0.0000 [IU] | Freq: Three times a day (TID) | INTRAMUSCULAR | 11 refills | Status: DC

## 2024-03-12 MED ORDER — METOPROLOL TARTRATE 25 MG PO TABS: 25.0000 mg | ORAL_TABLET | Freq: Two times a day (BID) | ORAL | 0 refills | Status: DC

## 2024-03-12 MED ORDER — LOSARTAN POTASSIUM 50 MG PO TABS
100.0000 mg | ORAL_TABLET | Freq: Every day | ORAL | Status: DC
  Administered 2024-03-12: 100 mg via ORAL
  Filled 2024-03-12: qty 2

## 2024-03-12 MED ORDER — INSULIN ASPART 100 UNIT/ML IJ SOLN: 0.0000 [IU] | Freq: Three times a day (TID) | INTRAMUSCULAR | 0 refills | Status: DC

## 2024-03-12 MED ORDER — FUROSEMIDE 80 MG PO TABS: 80.0000 mg | ORAL_TABLET | Freq: Two times a day (BID) | ORAL | 0 refills | Status: DC

## 2024-03-12 NOTE — Discharge Summary (Signed)
 Physician Discharge Summary   Patient: Raymond Gordon MRN: [id number] DOB: 1951/02/23  Admit date:     03/08/2024  Discharge date: 03/12/24  Discharge Physician: Albertus Alt   PCP: [name], Richard W, MD   Recommendations at discharge:    Patient has been placed on metoprolol  for rate control atrial fibrillation.  Resume heart failure guideline medical therapy with losartan  and spironolactone .  Hold on SGLT 2 inh due urinary retention Resume furosemide  80 mg po bid Follow up renal function and electrolytes in 7 days.  Please do voiding trial in 7 days, trial off foley catheter Will send a ambulatory referral to Urology Follow up with Dr [name] in 7 to 10 days   Discharge Diagnoses: Principal Problem:   Acute on chronic diastolic CHF (congestive heart failure) (HCC) Active Problems:   Atrial fibrillation with RVR (HCC)   Essential hypertension   AKI (acute kidney injury) (HCC)   Type 2 diabetes mellitus with hyperlipidemia (HCC)   Obesity, class 3  Resolved Problems:   * No resolved hospital problems. [facility] Course: Raymond Gordon was admitted to the hospital with the working diagnosis of atrial fibrillation with RVR complicated with heart failure exacerbation,   73 y.o. male with medical history significant of chronic diastolic heart failure, atrial fibrillation, type 2 diabetes, hypertension, hyperlipidemia, obesity, chronic normocytic anemia, severe OSA, BPH presenting to the ED with dyspnea. Reported worsening symptoms for the last 24 hrs prior to admission, to he point where he had dyspnea with minimal efforts and not able to ambulate. EMS was called and he was found in atrial fibrillation with rapid ventricular response, not able to place IV access, he was given supplemental 02 per [place] and was brought to the hospital.  On his initial physical examination his heart rate was 160 bpm, blood pressure 106/91, RR 28 and 02 saturation 97%. Lungs with no wheezing, but  decreased breath sounds, heart with S1 and S2 present, irregularly irregular with no gallops, abdomen protuberant with no distention, positive lower extremity edema ++ pitting with features of lymphedema.    Na 141, K 3,7 Cl 112 bicarbonate 20 glucose 245 bun 10 cr 0,84  AST 14 ALT 12  BNP 257  Wbc 7,2 hgb 12,0 plt 295   Chest radiograph with hypoinflation, with positive cardiomegaly, bilateral hilar vascular congestion and bilateral central interstitial infiltrates with no effusions.   EKG 161 bpm, normal axis, qtc 432, normal intervals, atrial fibrillation rhythm with ST depression V2 to V5, with no T wave changes.   Patient was placed on IV diltiazem  for anticoagulation and IV furosemide  for diuresis.   04/16 urinary retention, placed foley cathter  04/17 clinically improved, pending transfer to SNF.  04/18 patient medically stable for discharge to SNF.   Assessment and Plan: * Acute on chronic diastolic CHF (congestive heart failure) (HCC) Echocardiogram with preserved LV systolic function with EF 55 to 60%, RV systolic function preserved, LA with moderate dilatation,   Patient was placed on furosemide  for diuresis, negative fluid balance was achieved, - 3,567 ml, with significant improvement in his symptoms.   Systolic blood pressure 140 mmHg range  Continue afterload reduction with isosorbide .  Continue metoprolol  and will resume losartan  plus spironolactone .  Resume furosemide  80 mg po daily. Hold on SGLT 2 inh due to urinary retention and high risk for urinary tract infections.    Atrial fibrillation with RVR (HCC) Continue rate control with metoprolol  and anticoagulation with apixaban .  Patient has been sinus rhythm.  Essential hypertension Continue blood pressure control with isosorbide .  Will resume losartan  plus spironolactone  and for now will continue to hold on amlodipine .   AKI (acute kidney injury) (HCC) Positive urinary retention, now has a foley cathter in  place.  Renal ultrasound with mild right hydronephrosis.  Renal function with serum cr at 1,0 with K at 4,5 and serum bicarbonate at 22  Na 142 and Mg 2,3  Follow up renal function in 7 days as outpatient.   Urinary retention, plan to keel foley catheter at discharge and do voiding trial as outpatient in 7 days.  Continue finasteride    Ambulatory referral for Urology.   Type 2 diabetes mellitus with hyperlipidemia (HCC) Patient was placed on insulin  sliding scale for glucose cover and monitoring during his hospitalization.  He will continue insulin  therapy at discharge.   Statin therapy   Obesity, class 3 Calculated BMI is 47.9  OSA continue with Cpap.        Consultants: cardiology  Procedures performed: none   Disposition: Skilled nursing facility Diet recommendation:  Clear liquid diet, advance as tolerated DISCHARGE MEDICATION: Allergies as of 03/12/2024   No Known Allergies      Medication List     STOP taking these medications    amLODipine  10 MG tablet Commonly known as: NORVASC    Farxiga  10 MG Tabs tablet Generic drug: dapagliflozin  propanediol   HumuLIN R  U-500 KwikPen 500 UNIT/ML KwikPen Generic drug: insulin  regular human CONCENTRATED       TAKE these medications    allopurinol  300 MG tablet Commonly known as: ZYLOPRIM  Take 300 mg by mouth in the morning.   aspirin  EC 81 MG tablet Take 81 mg by mouth in the morning.   atorvastatin  80 MG tablet Commonly known as: LIPITOR  Take 1 tablet by mouth once daily What changed: when to take this   doxazosin  4 MG tablet Commonly known as: CARDURA  Take 4 mg by mouth in the morning.   Eliquis  5 MG Tabs tablet Generic drug: apixaban  Take 1 tablet by mouth twice daily What changed: how much to take   finasteride  5 MG tablet Commonly known as: PROSCAR  Take 5 mg by mouth every evening.   furosemide  80 MG tablet Commonly known as: LASIX  Take 1 tablet (80 mg total) by mouth 2 (two) times  daily.   insulin  aspart 100 UNIT/ML injection Commonly known as: novoLOG  Inject 0-20 Units into the skin 3 (three) times daily with meals. For glucose 121 to 150 use 3 units, for 151 to 200 use 4 units, for 201 to 250 use 7 units, for 251 to 300 use 11 units, for 301 to 350 use 15 units, for 351 to 400 use 20 units.   isosorbide  mononitrate 30 MG 24 hr tablet Commonly known as: IMDUR  Take 1 tablet (30 mg total) by mouth daily. What changed: when to take this   losartan  100 MG tablet Commonly known as: COZAAR  Take 100 mg by mouth in the morning.   metoprolol  tartrate 25 MG tablet Commonly known as: LOPRESSOR  Take 1 tablet (25 mg total) by mouth 2 (two) times daily.   multivitamin with minerals Tabs tablet Take 1 tablet by mouth in the morning.   omeprazole  20 MG capsule Commonly known as: PRILOSEC  Take 20 mg by mouth in the morning.   PRESCRIPTION MEDICATION Inhale into the lungs at bedtime. Pt is on C Pap machine   spironolactone  25 MG tablet Commonly known as: ALDACTONE  Take 25 mg by mouth in the morning.  Contact information for after-discharge care     Destination     [facility] Preferred SNF .   Service: Skilled Nursing Contact information: [address] [place]  [address] [phone]                    Discharge Exam: Cleavon Curls Weights   03/10/24 0404 03/11/24 0409 03/12/24 0403  Weight: 126.6 kg 127.2 kg 128.5 kg   BP (!) 143/54 (BP Location: Left Arm)   Pulse 60   Temp 98.5 F (36.9 C) (Oral)   Resp 20   Ht 5\' 4"  (1.626 m)   Wt 128.5 kg   SpO2 92%   BMI 48.63 kg/m   Patient is feeling well, no chest pain or dyspnea, no PND or orthopnea, lower extremity edema continue to improve   Neurology awake and alert ENT with mild pallor with no icterus Cardiovascular with S1 and S2 present and regular with no gallops, rubs or murmurs Respiratory with no rales or wheezing, no rhonchi on anterior auscultation   Abdomen with no distention  Condition at discharge: stable  The results of significant diagnostics from this hospitalization (including imaging, microbiology, ancillary and laboratory) are listed below for reference.   Imaging Studies: US  RENAL Result Date: 03/10/2024 CLINICAL DATA:  [id number] Urinary retention [id number] EXAM: RENAL / URINARY TRACT ULTRASOUND COMPLETE COMPARISON:  None Available. FINDINGS: The right kidney measured 11.1 cm and the left kidney measured 10.9 cm. The kidneys demonstrate normal echogenicity. No renal parenchymal lesions are identified. No shadowing stones are seen. Mild hydronephrosis on the right. Bladder: Appears normal for degree of bladder distention. IMPRESSION: Mild right hydronephrosis. Electronically Signed   By: Sydell Eva M.D.   On: 03/10/2024 21:13   ECHOCARDIOGRAM COMPLETE Result Date: 03/09/2024    ECHOCARDIOGRAM REPORT   Patient Name:   BRODEY BONN Date of Exam: 03/09/2024 Medical Rec #:  [id number]     Height:       64.0 in Accession #:    [id number]    Weight:       285.7 lb Date of Birth:  [birth year]      BSA:          2.277 m Patient Age:    72 years      BP:           142/61 mmHg Patient Gender: M             HR:           63 bpm. Exam Location:  Inpatient Procedure: 2D Echo, Cardiac Doppler and Color Doppler (Both Spectral and Color            Flow Doppler were utilized during procedure). Indications:    Atrial Fibrillation  History:        Patient has prior history of Echocardiogram examinations, most                 recent 07/09/2015. Risk Factors:Hypertension, Diabetes,                 Dyslipidemia, Sleep Apnea and Former Smoker.  Sonographer:    Reta Cassis Referring Phys: [id number] SAGAR H JINWALA IMPRESSIONS  1. Left ventricular ejection fraction, by estimation, is 55 to 60%. The left ventricle has normal function. Left ventricular endocardial border not optimally defined to evaluate regional wall motion. Left ventricular diastolic parameters were  normal.  2. Right ventricular systolic function is normal. The right ventricular size is normal.  3. Left  atrial size was moderately dilated.  4. The mitral valve is normal in structure. No evidence of mitral valve regurgitation. No evidence of mitral stenosis.  5. The aortic valve was not well visualized. Aortic valve regurgitation is not visualized. No aortic stenosis is present.  6. The inferior vena cava is normal in size with greater than 50% respiratory variability, suggesting right atrial pressure of 3 mmHg. FINDINGS  Left Ventricle: Left ventricular ejection fraction, by estimation, is 55 to 60%. The left ventricle has normal function. Left ventricular endocardial border not optimally defined to evaluate regional wall motion. The left ventricular internal cavity size was normal in size. There is no left ventricular hypertrophy. Left ventricular diastolic parameters were normal. Right Ventricle: The right ventricular size is normal. No increase in right ventricular wall thickness. Right ventricular systolic function is normal. Left Atrium: Left atrial size was moderately dilated. Right Atrium: Right atrial size was normal in size. Pericardium: There is no evidence of pericardial effusion. Mitral Valve: The mitral valve is normal in structure. No evidence of mitral valve regurgitation. No evidence of mitral valve stenosis. MV peak gradient, 2.7 mmHg. The mean mitral valve gradient is 1.0 mmHg. Tricuspid Valve: The tricuspid valve is not well visualized. Tricuspid valve regurgitation is not demonstrated. No evidence of tricuspid stenosis. Aortic Valve: The aortic valve was not well visualized. Aortic valve regurgitation is not visualized. No aortic stenosis is present. Aortic valve mean gradient measures 2.0 mmHg. Aortic valve peak gradient measures 3.4 mmHg. Aortic valve area, by VTI measures 2.52 cm. Pulmonic Valve: The pulmonic valve was normal in structure. Pulmonic valve regurgitation is not visualized.  No evidence of pulmonic stenosis. Aorta: The aortic root is normal in size and structure. Venous: The inferior vena cava is normal in size with greater than 50% respiratory variability, suggesting right atrial pressure of 3 mmHg. IAS/Shunts: No atrial level shunt detected by color flow Doppler.  LEFT VENTRICLE PLAX 2D LVIDd:         5.40 cm   Diastology LVIDs:         4.60 cm   LV e' medial:    5.55 cm/s LV PW:         0.90 cm   LV E/e' medial:  15.1 LV IVS:        1.00 cm   LV e' lateral:   9.14 cm/s LVOT diam:     2.00 cm   LV E/e' lateral: 9.2 LV SV:         59 LV SV Index:   26 LVOT Area:     3.14 cm  RIGHT VENTRICLE             IVC RV Basal diam:  4.70 cm     IVC diam: 2.30 cm RV S prime:     13.50 cm/s TAPSE (M-mode): 2.6 cm LEFT ATRIUM              Index        RIGHT ATRIUM           Index LA diam:        4.20 cm  1.84 cm/m   RA Area:     20.50 cm LA Vol (A2C):   128.0 ml 56.22 ml/m  RA Volume:   60.20 ml  26.44 ml/m LA Vol (A4C):   63.9 ml  28.07 ml/m LA Biplane Vol: 92.1 ml  40.46 ml/m  AORTIC VALVE AV Area (Vmax):    2.61 cm AV Area (Vmean):  2.54 cm AV Area (VTI):     2.52 cm AV Vmax:           91.70 cm/s AV Vmean:          63.000 cm/s AV VTI:            0.234 m AV Peak Grad:      3.4 mmHg AV Mean Grad:      2.0 mmHg LVOT Vmax:         76.20 cm/s LVOT Vmean:        51.000 cm/s LVOT VTI:          0.188 m LVOT/AV VTI ratio: 0.80  AORTA Ao Root diam: 3.60 cm MITRAL VALVE MV Area (PHT): 6.17 cm    SHUNTS MV Area VTI:   2.41 cm    Systemic VTI:  0.19 m MV Peak grad:  2.7 mmHg    Systemic Diam: 2.00 cm MV Mean grad:  1.0 mmHg MV Vmax:       0.83 m/s MV Vmean:      44.2 cm/s MV Decel Time: 123 msec MV E velocity: 83.90 cm/s MV A velocity: 58.10 cm/s MV E/A ratio:  1.44 Dorothye Gathers MD Electronically signed by Dorothye Gathers MD Signature Date/Time: 03/09/2024/9:58:06 AM    Final    DG Chest Port 1 View Result Date: 03/08/2024 CLINICAL DATA:  Dyspnea. EXAM: PORTABLE CHEST 1 VIEW COMPARISON:  Radiographs  08/13/2015 and 07/08/2015. FINDINGS: 1138 hours. Lordotic positioning. Stable cardiomegaly and chronic vascular congestion. There is increased asymmetric left perihilar opacity which may reflect asymmetric edema. No consolidation or pneumothorax. There may be a small left pleural effusion. The bones appear unchanged. Telemetry leads overlie the chest. IMPRESSION: Cardiomegaly with chronic vascular congestion and possible asymmetric left perihilar edema. Short-term radiographic follow-up recommended. Electronically Signed   By: Elmon Hagedorn M.D.   On: 03/08/2024 13:23    Microbiology: Results for orders placed or performed during the hospital encounter of 08/13/15  Urine culture     Status: None   Collection Time: 08/19/15  7:55 PM   Specimen: Urine, Catheterized  Result Value Ref Range Status   Specimen Description URINE, CATHETERIZED  Final   Special Requests NONE  Final   Culture >=100,000 COLONIES/mL ENTEROBACTER CLOACAE  Final   Report Status 08/22/2015 FINAL  Final   Organism ID, Bacteria ENTEROBACTER CLOACAE  Final      Susceptibility   Enterobacter cloacae - MIC*    CEFAZOLIN >=64 RESISTANT Resistant     CEFTRIAXONE <=1 SENSITIVE Sensitive     CIPROFLOXACIN <=0.25 SENSITIVE Sensitive     GENTAMICIN  <=1 SENSITIVE Sensitive     IMIPENEM <=0.25 SENSITIVE Sensitive     NITROFURANTOIN 64 INTERMEDIATE Intermediate     TRIMETH /SULFA  <=20 SENSITIVE Sensitive     PIP/TAZO 8 SENSITIVE Sensitive     * >=100,000 COLONIES/mL ENTEROBACTER CLOACAE    Labs: CBC: Recent Labs  Lab 03/08/24 0954 03/09/24 0926 03/10/24 0236  WBC 7.2 8.1 7.2  HGB 12.0* 12.0* 10.8*  HCT 38.8* 37.7* 34.6*  MCV 91.1 88.9 89.2  PLT 295 334 310   Basic Metabolic Panel: Recent Labs  Lab 03/08/24 0954 03/09/24 0926 03/10/24 0236 03/11/24 0244 03/12/24 0247  NA 141 145 141 142 142  K 3.7 4.4 4.0 4.0 4.5  CL 112* 113* 112* 111 113*  CO2 20* 20* 21* 23 22  GLUCOSE 245* 89 144* 178* 163*  BUN 10 23 31*  31* 26*  CREATININE 0.84 1.42* 1.79* 1.24 1.07  CALCIUM  8.6* 8.8* 8.5* 8.5* 8.9  MG  --   --   --  2.3 2.3   Liver Function Tests: Recent Labs  Lab 03/08/24 0954  AST 14*  ALT 12  ALKPHOS 67  BILITOT 0.7  PROT 6.9  ALBUMIN 2.9*   CBG: Recent Labs  Lab 03/11/24 1102 03/11/24 1603 03/11/24 2044 03/12/24 0609 03/12/24 1102  GLUCAP 155* 185* 200* 148* 185*    Discharge time spent: greater than 30 minutes.  Signed: Albertus Alt, MD [facility] 03/12/2024

## 2024-03-12 NOTE — Progress Notes (Signed)
 Progress Note   Patient: Raymond Gordon:[id number] DOB: 17-Feb-1951 DOA: 03/08/2024     4 DOS: the patient was seen and examined on 03/12/2024   Brief hospital course: Mr. Brandt was admitted to the hospital with the working diagnosis of atrial fibrillation with RVR complicated with heart failure exacerbation,   73 y.o. male with medical history significant of chronic diastolic heart failure, atrial fibrillation, type 2 diabetes, hypertension, hyperlipidemia, obesity, chronic normocytic anemia, severe OSA, BPH presenting to the ED with dyspnea. Reported worsening symptoms for the last 24 hrs prior to admission, to he point where he had dyspnea with minimal efforts and not able to ambulate. EMS was called and he was found in atrial fibrillation with rapid ventricular response, not able to place IV access, he was given supplemental 02 per [place] and was brought to the hospital.  On his initial physical examination his heart rate was 160 bpm, blood pressure 106/91, RR 28 and 02 saturation 97%. Lungs with no wheezing, but decreased breath sounds, heart with S1 and S2 present, irregularly irregular with no gallops, abdomen protuberant with no distention, positive lower extremity edema ++ pitting with features of lymphedema.    Patient was placed on IV diltiazem  for anticoagulation and IV furosemide  for diuresis.   04/16 urinary retention, placed foley cathter  04/17 clinically improved, pending transfer to SNF.  04/18 patient medically stable for discharge to SNF.   Assessment and Plan: * Acute on chronic diastolic CHF (congestive heart failure) (HCC) Echocardiogram with preserved LV systolic function with EF 55 to 60%, RV systolic function preserved, LA with moderate dilatation,   Urine output 1,550  ml Systolic blood pressure 140 mmHg range  Continue afterload reduction with hydralazine  and isosorbide .  Continue metoprolol   Plan to resume loop diuretic in am.  Limited pharmacologic options due to  acutely reduced GFR   Atrial fibrillation with RVR (HCC) Continue rate control with metoprolol  and anticoagulation with apixaban .  Continue telemetry monitoring  Patient has been sinus rhythm.   Essential hypertension Continue blood pressure control with hydralazine  and isosorbide .   AKI (acute kidney injury) (HCC) Positive urinary retention, now has a foley cathter in place.  Renal ultrasound with mild right hydronephrosis.  Renal function with serum cr at 1,0 with K at 4,5 and serum bicarbonate at 22  Na 142 and Mg 2,3  Plan to follow up renal function in am Plan to resume loop diuretic tomorrow   Type 2 diabetes mellitus with hyperlipidemia (HCC) Continue insulin  sliding scale for glucose cover and monitoring  Statin therapy   Obesity, class 3 Calculated BMI is 47.9  OSA continue with Cpap.       Subjective: Patient is feeling well, no chest pain or dyspnea, no PND or orthopnea, lower extremity edema continue to improve   Physical Exam: Vitals:   03/12/24 0404 03/12/24 0648 03/12/24 0711 03/12/24 1028  BP:  (!) 184/66 (!) 189/59 (!) 143/54  Pulse: (!) 58   60  Resp: 20  20 20   Temp:   98.4 F (36.9 C) 98.5 F (36.9 C)  TempSrc:   Oral Oral  SpO2: 92%  91% 92%  Weight:      Height:       Neurology awake and alert ENT with mild pallor with no icterus Cardiovascular with S1 and S2 present and regular with no gallops, rubs or murmurs Respiratory with no rales or wheezing, no rhonchi on anterior auscultation  Abdomen with no distention  Data Reviewed:  Family Communication: I spoke with patient's sister at the bedside, we talked in detail about patient's condition, plan of care and prognosis and all questions were addressed.   Disposition: Status is: Inpatient Remains inpatient appropriate because: pending transfer to SNF   Planned Discharge Destination: Skilled nursing facility     Author: Albertus Alt, MD 03/12/2024 11:43 AM  For on  call review [email or web address].

## 2024-03-12 NOTE — Progress Notes (Signed)
 Report called [facility] to nurse

## 2024-03-12 NOTE — TOC Progression Note (Addendum)
 Transition of Care [facility]) - Progression Note    Patient Details  Name: MUMIN DENOMME MRN: [id number] Date of Birth: [birth year]  Transition of Care [facility]) CM/SW Contact  Niels LITTIE Portugal, LCSW Phone Number: 03/12/2024, 10:03 AM  Clinical Narrative:    2:30pm: Patient's insurance authorization has been approved - SNF [id number], [id number].  Patient will go to [facility], room 118P via [facility] - [facility] has been called by CSW at 3:15pm. The number to call for report is [phone].  CSW spoke with patient's sister Charolette who states she will go pick up CPAP and take it to [facility].  10am: CSW spoke with patient's sister Charolette who states the family has selected [facility]. Charolette confirms she will obtain patient's home CPAP and deliver it to the facility once he is discharged.  CSW initiated insurance authorization with Connie at [facility] for SNF and [facility].  CSW spoke with Kia at [facility] to inform her of information.  Expected Discharge Plan: Skilled Nursing Facility Barriers to Discharge: Continued Medical Work up, Other (must enter comment), English As A Second Language Teacher  Expected Discharge Plan and Services In-house Referral: Clinical Social Work     Living arrangements for the past 2 months: Single Family Home                                       Social Determinants of Health (SDOH) Interventions SDOH Screenings   Food Insecurity: No Food Insecurity (03/09/2024)  Housing: Low Risk  (03/09/2024)  Transportation Needs: No Transportation Needs (03/09/2024)  Utilities: Not At Risk (03/09/2024)  Social Connections: Moderately Integrated (03/09/2024)  Tobacco Use: Medium Risk (03/08/2024)    Readmission Risk Interventions     No data to display

## 2024-03-12 NOTE — Plan of Care (Signed)
  Problem: Coping: Goal: Ability to adjust to condition or change in health will improve Outcome: Progressing   Problem: Fluid Volume: Goal: Ability to maintain a balanced intake and output will improve Outcome: Progressing   Problem: Education: Goal: Knowledge of General Education information will improve Description: Including pain rating scale, medication(s)/side effects and non-pharmacologic comfort measures Outcome: Progressing   

## 2024-03-13 DIAGNOSIS — Z7189 Other specified counseling: Secondary | ICD-10-CM | POA: Diagnosis not present

## 2024-03-13 DIAGNOSIS — J9612 Chronic respiratory failure with hypercapnia: Secondary | ICD-10-CM | POA: Diagnosis not present

## 2024-03-13 DIAGNOSIS — R531 Weakness: Secondary | ICD-10-CM | POA: Diagnosis not present

## 2024-03-13 DIAGNOSIS — I509 Heart failure, unspecified: Secondary | ICD-10-CM | POA: Diagnosis not present

## 2024-03-14 DIAGNOSIS — R531 Weakness: Secondary | ICD-10-CM | POA: Diagnosis not present

## 2024-03-14 DIAGNOSIS — J9612 Chronic respiratory failure with hypercapnia: Secondary | ICD-10-CM | POA: Diagnosis not present

## 2024-03-15 DIAGNOSIS — J9612 Chronic respiratory failure with hypercapnia: Secondary | ICD-10-CM | POA: Diagnosis not present

## 2024-03-15 DIAGNOSIS — Z978 Presence of other specified devices: Secondary | ICD-10-CM | POA: Diagnosis not present

## 2024-03-15 DIAGNOSIS — R531 Weakness: Secondary | ICD-10-CM | POA: Diagnosis not present

## 2024-03-15 DIAGNOSIS — Q809 Congenital ichthyosis, unspecified: Secondary | ICD-10-CM | POA: Diagnosis not present

## 2024-03-15 DIAGNOSIS — Z9981 Dependence on supplemental oxygen: Secondary | ICD-10-CM | POA: Diagnosis not present

## 2024-03-15 DIAGNOSIS — Z7409 Other reduced mobility: Secondary | ICD-10-CM | POA: Diagnosis not present

## 2024-03-16 DIAGNOSIS — Z7409 Other reduced mobility: Secondary | ICD-10-CM | POA: Diagnosis not present

## 2024-03-16 DIAGNOSIS — J9612 Chronic respiratory failure with hypercapnia: Secondary | ICD-10-CM | POA: Diagnosis not present

## 2024-03-16 DIAGNOSIS — I130 Hypertensive heart and chronic kidney disease with heart failure and stage 1 through stage 4 chronic kidney disease, or unspecified chronic kidney disease: Secondary | ICD-10-CM | POA: Diagnosis not present

## 2024-03-16 DIAGNOSIS — I509 Heart failure, unspecified: Secondary | ICD-10-CM | POA: Diagnosis not present

## 2024-03-16 DIAGNOSIS — Z9981 Dependence on supplemental oxygen: Secondary | ICD-10-CM | POA: Diagnosis not present

## 2024-03-16 DIAGNOSIS — N189 Chronic kidney disease, unspecified: Secondary | ICD-10-CM | POA: Diagnosis not present

## 2024-03-16 DIAGNOSIS — Z978 Presence of other specified devices: Secondary | ICD-10-CM | POA: Diagnosis not present

## 2024-03-16 DIAGNOSIS — E1165 Type 2 diabetes mellitus with hyperglycemia: Secondary | ICD-10-CM | POA: Diagnosis not present

## 2024-03-16 DIAGNOSIS — R339 Retention of urine, unspecified: Secondary | ICD-10-CM | POA: Diagnosis not present

## 2024-03-16 DIAGNOSIS — E785 Hyperlipidemia, unspecified: Secondary | ICD-10-CM | POA: Diagnosis not present

## 2024-03-16 DIAGNOSIS — G4733 Obstructive sleep apnea (adult) (pediatric): Secondary | ICD-10-CM | POA: Diagnosis not present

## 2024-03-16 DIAGNOSIS — R531 Weakness: Secondary | ICD-10-CM | POA: Diagnosis not present

## 2024-03-17 DIAGNOSIS — R531 Weakness: Secondary | ICD-10-CM | POA: Diagnosis not present

## 2024-03-17 DIAGNOSIS — I4891 Unspecified atrial fibrillation: Secondary | ICD-10-CM | POA: Diagnosis not present

## 2024-03-17 DIAGNOSIS — I509 Heart failure, unspecified: Secondary | ICD-10-CM | POA: Diagnosis not present

## 2024-03-17 DIAGNOSIS — J9612 Chronic respiratory failure with hypercapnia: Secondary | ICD-10-CM | POA: Diagnosis not present

## 2024-03-17 DIAGNOSIS — Z978 Presence of other specified devices: Secondary | ICD-10-CM | POA: Diagnosis not present

## 2024-03-20 DIAGNOSIS — J9612 Chronic respiratory failure with hypercapnia: Secondary | ICD-10-CM | POA: Diagnosis not present

## 2024-03-20 DIAGNOSIS — R531 Weakness: Secondary | ICD-10-CM | POA: Diagnosis not present

## 2024-03-20 DIAGNOSIS — Z9981 Dependence on supplemental oxygen: Secondary | ICD-10-CM | POA: Diagnosis not present

## 2024-03-20 DIAGNOSIS — I509 Heart failure, unspecified: Secondary | ICD-10-CM | POA: Diagnosis not present

## 2024-03-20 DIAGNOSIS — Z7409 Other reduced mobility: Secondary | ICD-10-CM | POA: Diagnosis not present

## 2024-03-20 DIAGNOSIS — Q809 Congenital ichthyosis, unspecified: Secondary | ICD-10-CM | POA: Diagnosis not present

## 2024-03-21 DIAGNOSIS — Z9981 Dependence on supplemental oxygen: Secondary | ICD-10-CM | POA: Diagnosis not present

## 2024-03-21 DIAGNOSIS — Z7409 Other reduced mobility: Secondary | ICD-10-CM | POA: Diagnosis not present

## 2024-03-21 DIAGNOSIS — J9612 Chronic respiratory failure with hypercapnia: Secondary | ICD-10-CM | POA: Diagnosis not present

## 2024-03-21 DIAGNOSIS — R531 Weakness: Secondary | ICD-10-CM | POA: Diagnosis not present

## 2024-03-21 DIAGNOSIS — Z7189 Other specified counseling: Secondary | ICD-10-CM | POA: Diagnosis not present

## 2024-03-21 DIAGNOSIS — R339 Retention of urine, unspecified: Secondary | ICD-10-CM | POA: Diagnosis not present

## 2024-03-24 ENCOUNTER — Ambulatory Visit: Admitting: Occupational Therapy

## 2024-03-24 DIAGNOSIS — R531 Weakness: Secondary | ICD-10-CM | POA: Diagnosis not present

## 2024-03-24 DIAGNOSIS — N400 Enlarged prostate without lower urinary tract symptoms: Secondary | ICD-10-CM | POA: Diagnosis not present

## 2024-03-24 DIAGNOSIS — I5033 Acute on chronic diastolic (congestive) heart failure: Secondary | ICD-10-CM | POA: Diagnosis not present

## 2024-03-24 DIAGNOSIS — R2689 Other abnormalities of gait and mobility: Secondary | ICD-10-CM | POA: Diagnosis not present

## 2024-03-24 DIAGNOSIS — R10819 Abdominal tenderness, unspecified site: Secondary | ICD-10-CM | POA: Diagnosis not present

## 2024-03-24 DIAGNOSIS — E113393 Type 2 diabetes mellitus with moderate nonproliferative diabetic retinopathy without macular edema, bilateral: Secondary | ICD-10-CM | POA: Diagnosis not present

## 2024-03-24 DIAGNOSIS — R339 Retention of urine, unspecified: Secondary | ICD-10-CM | POA: Diagnosis not present

## 2024-03-25 DIAGNOSIS — Z7409 Other reduced mobility: Secondary | ICD-10-CM | POA: Diagnosis not present

## 2024-03-25 DIAGNOSIS — R339 Retention of urine, unspecified: Secondary | ICD-10-CM | POA: Diagnosis not present

## 2024-03-25 DIAGNOSIS — N400 Enlarged prostate without lower urinary tract symptoms: Secondary | ICD-10-CM | POA: Diagnosis not present

## 2024-03-25 DIAGNOSIS — R310 Gross hematuria: Secondary | ICD-10-CM | POA: Diagnosis not present

## 2024-03-25 DIAGNOSIS — R531 Weakness: Secondary | ICD-10-CM | POA: Diagnosis not present

## 2024-03-25 DIAGNOSIS — Z978 Presence of other specified devices: Secondary | ICD-10-CM | POA: Diagnosis not present

## 2024-03-26 DIAGNOSIS — R339 Retention of urine, unspecified: Secondary | ICD-10-CM | POA: Diagnosis not present

## 2024-03-26 DIAGNOSIS — Z7409 Other reduced mobility: Secondary | ICD-10-CM | POA: Diagnosis not present

## 2024-03-26 DIAGNOSIS — N400 Enlarged prostate without lower urinary tract symptoms: Secondary | ICD-10-CM | POA: Diagnosis not present

## 2024-03-26 DIAGNOSIS — Z978 Presence of other specified devices: Secondary | ICD-10-CM | POA: Diagnosis not present

## 2024-03-26 DIAGNOSIS — R531 Weakness: Secondary | ICD-10-CM | POA: Diagnosis not present

## 2024-03-27 DIAGNOSIS — R531 Weakness: Secondary | ICD-10-CM | POA: Diagnosis not present

## 2024-03-27 DIAGNOSIS — N179 Acute kidney failure, unspecified: Secondary | ICD-10-CM | POA: Diagnosis not present

## 2024-03-27 DIAGNOSIS — N390 Urinary tract infection, site not specified: Secondary | ICD-10-CM | POA: Diagnosis not present

## 2024-03-27 DIAGNOSIS — Z978 Presence of other specified devices: Secondary | ICD-10-CM | POA: Diagnosis not present

## 2024-03-27 DIAGNOSIS — D649 Anemia, unspecified: Secondary | ICD-10-CM | POA: Diagnosis not present

## 2024-03-28 DIAGNOSIS — Z978 Presence of other specified devices: Secondary | ICD-10-CM | POA: Diagnosis not present

## 2024-03-28 DIAGNOSIS — R531 Weakness: Secondary | ICD-10-CM | POA: Diagnosis not present

## 2024-03-28 DIAGNOSIS — R339 Retention of urine, unspecified: Secondary | ICD-10-CM | POA: Diagnosis not present

## 2024-03-28 DIAGNOSIS — N390 Urinary tract infection, site not specified: Secondary | ICD-10-CM | POA: Diagnosis not present

## 2024-03-28 DIAGNOSIS — N179 Acute kidney failure, unspecified: Secondary | ICD-10-CM | POA: Diagnosis not present

## 2024-03-29 DIAGNOSIS — N400 Enlarged prostate without lower urinary tract symptoms: Secondary | ICD-10-CM | POA: Diagnosis not present

## 2024-03-29 DIAGNOSIS — Z7409 Other reduced mobility: Secondary | ICD-10-CM | POA: Diagnosis not present

## 2024-03-29 DIAGNOSIS — R531 Weakness: Secondary | ICD-10-CM | POA: Diagnosis not present

## 2024-03-29 DIAGNOSIS — Q809 Congenital ichthyosis, unspecified: Secondary | ICD-10-CM | POA: Diagnosis not present

## 2024-03-29 DIAGNOSIS — N179 Acute kidney failure, unspecified: Secondary | ICD-10-CM | POA: Diagnosis not present

## 2024-03-29 DIAGNOSIS — R339 Retention of urine, unspecified: Secondary | ICD-10-CM | POA: Diagnosis not present

## 2024-03-29 DIAGNOSIS — N390 Urinary tract infection, site not specified: Secondary | ICD-10-CM | POA: Diagnosis not present

## 2024-03-29 DIAGNOSIS — Z978 Presence of other specified devices: Secondary | ICD-10-CM | POA: Diagnosis not present

## 2024-03-30 ENCOUNTER — Ambulatory Visit: Admitting: Nurse Practitioner

## 2024-03-30 DIAGNOSIS — I878 Other specified disorders of veins: Secondary | ICD-10-CM | POA: Diagnosis not present

## 2024-03-30 DIAGNOSIS — Z978 Presence of other specified devices: Secondary | ICD-10-CM | POA: Diagnosis not present

## 2024-03-30 DIAGNOSIS — R531 Weakness: Secondary | ICD-10-CM | POA: Diagnosis not present

## 2024-03-30 DIAGNOSIS — N2889 Other specified disorders of kidney and ureter: Secondary | ICD-10-CM | POA: Diagnosis not present

## 2024-03-30 DIAGNOSIS — Z7409 Other reduced mobility: Secondary | ICD-10-CM | POA: Diagnosis not present

## 2024-03-30 DIAGNOSIS — R339 Retention of urine, unspecified: Secondary | ICD-10-CM | POA: Diagnosis not present

## 2024-03-30 DIAGNOSIS — J9612 Chronic respiratory failure with hypercapnia: Secondary | ICD-10-CM | POA: Diagnosis not present

## 2024-03-31 ENCOUNTER — Ambulatory Visit: Attending: Cardiovascular Disease | Admitting: Cardiovascular Disease

## 2024-03-31 ENCOUNTER — Encounter: Payer: Self-pay | Admitting: Cardiovascular Disease

## 2024-03-31 VITALS — BP 132/76 | HR 63 | Ht 64.0 in | Wt 265.2 lb

## 2024-03-31 DIAGNOSIS — Z7409 Other reduced mobility: Secondary | ICD-10-CM | POA: Diagnosis not present

## 2024-03-31 DIAGNOSIS — I5032 Chronic diastolic (congestive) heart failure: Secondary | ICD-10-CM

## 2024-03-31 DIAGNOSIS — R2689 Other abnormalities of gait and mobility: Secondary | ICD-10-CM | POA: Diagnosis not present

## 2024-03-31 DIAGNOSIS — E782 Mixed hyperlipidemia: Secondary | ICD-10-CM

## 2024-03-31 DIAGNOSIS — G4733 Obstructive sleep apnea (adult) (pediatric): Secondary | ICD-10-CM | POA: Diagnosis not present

## 2024-03-31 DIAGNOSIS — I480 Paroxysmal atrial fibrillation: Secondary | ICD-10-CM | POA: Diagnosis not present

## 2024-03-31 DIAGNOSIS — R339 Retention of urine, unspecified: Secondary | ICD-10-CM | POA: Diagnosis not present

## 2024-03-31 DIAGNOSIS — E113393 Type 2 diabetes mellitus with moderate nonproliferative diabetic retinopathy without macular edema, bilateral: Secondary | ICD-10-CM | POA: Diagnosis not present

## 2024-03-31 DIAGNOSIS — N179 Acute kidney failure, unspecified: Secondary | ICD-10-CM | POA: Diagnosis not present

## 2024-03-31 DIAGNOSIS — I10 Essential (primary) hypertension: Secondary | ICD-10-CM

## 2024-03-31 DIAGNOSIS — E1165 Type 2 diabetes mellitus with hyperglycemia: Secondary | ICD-10-CM | POA: Diagnosis not present

## 2024-03-31 DIAGNOSIS — I5033 Acute on chronic diastolic (congestive) heart failure: Secondary | ICD-10-CM | POA: Diagnosis not present

## 2024-03-31 DIAGNOSIS — N400 Enlarged prostate without lower urinary tract symptoms: Secondary | ICD-10-CM | POA: Diagnosis not present

## 2024-03-31 DIAGNOSIS — Z978 Presence of other specified devices: Secondary | ICD-10-CM | POA: Diagnosis not present

## 2024-03-31 NOTE — Progress Notes (Signed)
 03/31/2024 Raymond Gordon   26-Oct-1951  [id number]  Primary Physician [name], Kristina Pfeiffer, MD Primary Cardiologist: Avanell Leigh MD FACP, [place], [place], [place]  HPI:  Raymond Gordon is a 73 y.o.  severely overweight, single African American male with no children who I last  saw in the office 06/08/2022.  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 His risk factors including noninsulin-requiring diabetes, hypertension, hyperlipidemia and remote tobacco abuse having quit smoking back in 1999 and had smoked 1 pack per day. His father did have an MI and had bypass surgery. He was admitted to [facility] July 17, 2011, with chest pain and A-fib. He was catheterized by Dr. [place] Hilty on July 18, 2011, revealing minimal luminal irregularities with small vessel disease and normal LV function. He ultimately spontaneously converted to sinus rhythm and was placed on Pradaxa  because of a CHADS2 score of 2. He has had no recurrent symptoms. He denies chest pain or shortness of breath.Dr. [name] follows  his lipid profile. He does give symptoms compatible with obstructive sleep apnea and currently wears CPAP with benefit. He was admitted to [facility] 07/08/15 with A. Fib with RVR and chest pain. His enzymes were negative. He is placed in IV due to diltiazem  and converted spontaneously to sinus rhythm. His amlodipine  was changed to by mouth diltiazem . He has had no recurrent symptoms. Since I saw him in the office back in August of last year he was admitted to [facility] on 08/13/15 through 08/22/15 with diastolic heart failure. He was diuresed 21 L and was educated about a low-salt diet.   Since I  saw him in the office 2 years ago he was admitted to the hospital with diastolic heart failure secondary to dietary indiscretion and was diuresed.  He is currently in a rehab facility with the intent to be transition to  home.  He is on spironolactone  and twice daily furosemide .  His weight is down 14 pounds compared to his discharge weight.   Current Meds  Medication Sig   allopurinol  (ZYLOPRIM ) 300 MG tablet Take 300 mg by mouth in the morning.   apixaban  (ELIQUIS ) 5 MG TABS tablet Take 1 tablet by mouth twice daily (Patient taking differently: Take 5 mg by mouth 2 (two) times daily.)   aspirin  EC 81 MG tablet Take 81 mg by mouth in the morning.   atorvastatin  (LIPITOR ) 80 MG tablet Take 1 tablet by mouth once daily (Patient taking differently: Take 80 mg by mouth in the morning.)   doxazosin  (CARDURA ) 4 MG tablet Take 4 mg by mouth in the morning.   finasteride  (PROSCAR ) 5 MG tablet Take 5 mg by mouth every evening.   insulin  aspart (NOVOLOG ) 100 UNIT/ML injection Inject 0-20 Units into the skin 3 (three) times daily with meals. For glucose 121 to 150 use 3 units, for 151 to 200 use 4 units, for 201 to 250 use 7 units, for 251 to 300 use 11 units, for 301 to 350 use 15 units, for 351 to 400 use 20 units.   isosorbide  mononitrate (IMDUR ) 30 MG 24 hr tablet Take 1 tablet (30 mg total) by mouth daily. (Patient taking differently: Take 30 mg by mouth in the morning.)   losartan  (COZAAR ) 100 MG tablet Take 100 mg by mouth in the morning.   metoprolol  tartrate (LOPRESSOR ) 25 MG tablet  Take 1 tablet (25 mg total) by mouth 2 (two) times daily.   Multiple Vitamin (MULTIVITAMIN WITH MINERALS) TABS tablet Take 1 tablet by mouth in the morning.   omeprazole  (PRILOSEC ) 20 MG capsule Take 20 mg by mouth in the morning.   PRESCRIPTION MEDICATION Inhale into the lungs at bedtime. Pt is on C Pap machine   spironolactone  (ALDACTONE ) 25 MG tablet Take 25 mg by mouth in the morning.     No Known Allergies  Social History   Socioeconomic History   Marital status: Single    Spouse name: Not on file   Number of children: Not on file   Years of education: Not on file   Highest education level: Not on file  Occupational  History   Not on file  Tobacco Use   Smoking status: Former    Current packs/day: 0.00    Types: Cigarettes    Quit date: 11/25/1997    Years since quitting: 26.3   Smokeless tobacco: Former    Quit date: 06/14/2015  Substance and Sexual Activity   Alcohol  use: No    Alcohol /week: 0.0 standard drinks of alcohol    Drug use: No   Sexual activity: Not on file  Other Topics Concern   Not on file  Social History Narrative   Not on file   Social Drivers of Health   Financial Resource Strain: Not on file  Food Insecurity: No Food Insecurity (03/09/2024)   Hunger Vital Sign    Worried About Running Out of Food in the Last Year: Never true    Ran Out of Food in the Last Year: Never true  Transportation Needs: No Transportation Needs (03/09/2024)   PRAPARE - Administrator, Civil Service (Medical): No    Lack of Transportation (Non-Medical): No  Physical Activity: Not on file  Stress: Not on file  Social Connections: Moderately Integrated (03/09/2024)   Social Connection and Isolation Panel [NHANES]    Frequency of Communication with Friends and Family: More than three times a week    Frequency of Social Gatherings with Friends and Family: Twice a week    Attends Religious Services: More than 4 times per year    Active Member of [employer] or Organizations: Yes    Attends Engineer, structural: More than 4 times per year    Marital Status: Divorced  Intimate Partner Violence: Not At Risk (03/09/2024)   Humiliation, Afraid, Rape, and Kick questionnaire    Fear of Current or Ex-Partner: No    Emotionally Abused: No    Physically Abused: No    Sexually Abused: No     Review of Systems: General: negative for chills, fever, night sweats or weight changes.  Cardiovascular: negative for chest pain, dyspnea on exertion, edema, orthopnea, palpitations, paroxysmal nocturnal dyspnea or shortness of breath Dermatological: negative for rash Respiratory: negative for cough or  wheezing Urologic: negative for hematuria Abdominal: negative for nausea, vomiting, diarrhea, bright red blood per rectum, melena, or hematemesis Neurologic: negative for visual changes, syncope, or dizziness All other systems reviewed and are otherwise negative except as noted above.    Blood pressure 132/76, pulse 63, height 5\' 4"  (1.626 m), weight 265 lb 3.2 oz (120.3 kg), SpO2 93%.  General appearance: alert and no distress Neck: no adenopathy, no carotid bruit, no JVD, supple, symmetrical, trachea midline, and thyroid not enlarged, symmetric, no tenderness/mass/nodules Lungs: clear to auscultation bilaterally Heart: regular rate and rhythm, S1, S2 normal, no murmur, click, rub or  gallop Extremities: 3+ brawny edema bilaterally consistent with lymphedema. Pulses: 2+ and symmetric Skin: Skin color, texture, turgor normal. No rashes or lesions Neurologic: Grossly normal  EKG not performed today      ASSESSMENT AND PLAN:   Paroxysmal A-fib (HCC) History of paroxysmal A-fib maintaining sinus rhythm on Eliquis  oral anticoagulation.  Essential hypertension History of essential hypertension with blood pressure measured today at 132/76.  He is on losartan  and metoprolol  as well as spironolactone .  HLD (hyperlipidemia) History of hyperlipidemia on statin therapy with lipid profile performed/15/25 revealing total cholesterol 146, LDL 96 and HDL 41.  CHF (congestive heart failure), NYHA class I (HCC) History of congestive heart failure with preserved EF.  He is recently hospitalized with volume overload most likely related to dietary indiscretion.  He was diuresed and sent home on spironolactone  and twice daily furosemide .  He is currently living in a rehab facility with the intent to transition back to home.  His weight is down 14 pounds compared to discharge weight.  It appears that he has chronic lymphedema of his lower extremities which is probably his had for years.  Severe OSA  (obstructive sleep apnea) History of obstructive sleep apnea on CPAP.     Avanell Leigh MD FACP,FACC,FAHA, [facility] 03/31/2024 2:09 PM

## 2024-03-31 NOTE — Assessment & Plan Note (Signed)
 History of hyperlipidemia on statin therapy with lipid profile performed/15/25 revealing total cholesterol 146, LDL 96 and HDL 41.

## 2024-03-31 NOTE — Assessment & Plan Note (Signed)
 History of obstructive sleep apnea on CPAP.

## 2024-03-31 NOTE — Assessment & Plan Note (Signed)
History of paroxysmal A. fib maintaining sinus rhythm on Eliquis oral anticoagulation 

## 2024-03-31 NOTE — Assessment & Plan Note (Signed)
 History of congestive heart failure with preserved EF.  He is recently hospitalized with volume overload most likely related to dietary indiscretion.  He was diuresed and sent home on spironolactone  and twice daily furosemide .  He is currently living in a rehab facility with the intent to transition back to home.  His weight is down 14 pounds compared to discharge weight.  It appears that he has chronic lymphedema of his lower extremities which is probably his had for years.

## 2024-03-31 NOTE — Patient Instructions (Signed)
 Medication Instructions:  Your physician recommends that you continue on your current medications as directed. Please refer to the Current Medication list given to you today.  *If you need a refill on your cardiac medications before your next appointment, please call your pharmacy*  Follow-Up: At [facility], you and your health needs are our priority.  As part of our continuing mission to provide you with exceptional heart care, our providers are all part of one team.  This team includes your primary Cardiologist (physician) and Advanced Practice Providers or APPs (Physician Assistants and Nurse Practitioners) who all work together to provide you with the care you need, when you need it.  Your next appointment:   3 month(s)  Provider:   Marlana Silvan, NP         Then, Lauro Portal, MD will plan to see you again in 12 month(s).     We recommend signing up for the patient portal called "MyChart".  Sign up information is provided on this After Visit Summary.  MyChart is used to connect with patients for Virtual Visits (Telemedicine).  Patients are able to view lab/test results, encounter notes, upcoming appointments, etc.  Non-urgent messages can be sent to your provider as well.   To learn more about what you can do with MyChart, go to [email or web address].

## 2024-03-31 NOTE — Assessment & Plan Note (Signed)
 History of essential hypertension with blood pressure measured today at 132/76.  He is on losartan  and metoprolol  as well as spironolactone .

## 2024-04-01 DIAGNOSIS — R338 Other retention of urine: Secondary | ICD-10-CM | POA: Diagnosis not present

## 2024-04-01 DIAGNOSIS — R972 Elevated prostate specific antigen [PSA]: Secondary | ICD-10-CM | POA: Diagnosis not present

## 2024-04-01 DIAGNOSIS — Z8042 Family history of malignant neoplasm of prostate: Secondary | ICD-10-CM | POA: Diagnosis not present

## 2024-04-01 DIAGNOSIS — N130 Hydronephrosis with ureteropelvic junction obstruction: Secondary | ICD-10-CM | POA: Diagnosis not present

## 2024-04-02 DIAGNOSIS — Z7409 Other reduced mobility: Secondary | ICD-10-CM | POA: Diagnosis not present

## 2024-04-02 DIAGNOSIS — R339 Retention of urine, unspecified: Secondary | ICD-10-CM | POA: Diagnosis not present

## 2024-04-02 DIAGNOSIS — Z978 Presence of other specified devices: Secondary | ICD-10-CM | POA: Diagnosis not present

## 2024-04-02 DIAGNOSIS — R531 Weakness: Secondary | ICD-10-CM | POA: Diagnosis not present

## 2024-04-05 DIAGNOSIS — I130 Hypertensive heart and chronic kidney disease with heart failure and stage 1 through stage 4 chronic kidney disease, or unspecified chronic kidney disease: Secondary | ICD-10-CM | POA: Diagnosis not present

## 2024-04-05 DIAGNOSIS — R339 Retention of urine, unspecified: Secondary | ICD-10-CM | POA: Diagnosis not present

## 2024-04-05 DIAGNOSIS — N400 Enlarged prostate without lower urinary tract symptoms: Secondary | ICD-10-CM | POA: Diagnosis not present

## 2024-04-05 DIAGNOSIS — N189 Chronic kidney disease, unspecified: Secondary | ICD-10-CM | POA: Diagnosis not present

## 2024-04-05 DIAGNOSIS — Z7409 Other reduced mobility: Secondary | ICD-10-CM | POA: Diagnosis not present

## 2024-04-05 DIAGNOSIS — I509 Heart failure, unspecified: Secondary | ICD-10-CM | POA: Diagnosis not present

## 2024-04-05 DIAGNOSIS — E1165 Type 2 diabetes mellitus with hyperglycemia: Secondary | ICD-10-CM | POA: Diagnosis not present

## 2024-04-07 ENCOUNTER — Other Ambulatory Visit: Payer: Self-pay | Admitting: Urology

## 2024-04-07 DIAGNOSIS — Z7409 Other reduced mobility: Secondary | ICD-10-CM | POA: Diagnosis not present

## 2024-04-07 DIAGNOSIS — R339 Retention of urine, unspecified: Secondary | ICD-10-CM | POA: Diagnosis not present

## 2024-04-07 DIAGNOSIS — I10 Essential (primary) hypertension: Secondary | ICD-10-CM | POA: Diagnosis not present

## 2024-04-07 DIAGNOSIS — R338 Other retention of urine: Secondary | ICD-10-CM

## 2024-04-07 DIAGNOSIS — Z978 Presence of other specified devices: Secondary | ICD-10-CM | POA: Diagnosis not present

## 2024-04-07 DIAGNOSIS — R531 Weakness: Secondary | ICD-10-CM | POA: Diagnosis not present

## 2024-04-07 DIAGNOSIS — N400 Enlarged prostate without lower urinary tract symptoms: Secondary | ICD-10-CM | POA: Diagnosis not present

## 2024-04-07 DIAGNOSIS — I509 Heart failure, unspecified: Secondary | ICD-10-CM | POA: Diagnosis not present

## 2024-04-09 DIAGNOSIS — E1165 Type 2 diabetes mellitus with hyperglycemia: Secondary | ICD-10-CM | POA: Diagnosis not present

## 2024-04-09 DIAGNOSIS — R339 Retention of urine, unspecified: Secondary | ICD-10-CM | POA: Diagnosis not present

## 2024-04-09 DIAGNOSIS — D649 Anemia, unspecified: Secondary | ICD-10-CM | POA: Diagnosis not present

## 2024-04-09 DIAGNOSIS — N400 Enlarged prostate without lower urinary tract symptoms: Secondary | ICD-10-CM | POA: Diagnosis not present

## 2024-04-09 DIAGNOSIS — I130 Hypertensive heart and chronic kidney disease with heart failure and stage 1 through stage 4 chronic kidney disease, or unspecified chronic kidney disease: Secondary | ICD-10-CM | POA: Diagnosis not present

## 2024-04-09 DIAGNOSIS — I509 Heart failure, unspecified: Secondary | ICD-10-CM | POA: Diagnosis not present

## 2024-04-09 DIAGNOSIS — Q809 Congenital ichthyosis, unspecified: Secondary | ICD-10-CM | POA: Diagnosis not present

## 2024-04-09 DIAGNOSIS — E785 Hyperlipidemia, unspecified: Secondary | ICD-10-CM | POA: Diagnosis not present

## 2024-04-13 ENCOUNTER — Ambulatory Visit: Payer: Medicare HMO | Admitting: Podiatry

## 2024-04-14 ENCOUNTER — Ambulatory Visit (INDEPENDENT_AMBULATORY_CARE_PROVIDER_SITE_OTHER): Payer: Medicare HMO | Admitting: Podiatry

## 2024-04-14 DIAGNOSIS — Z91198 Patient's noncompliance with other medical treatment and regimen for other reason: Secondary | ICD-10-CM

## 2024-04-14 DIAGNOSIS — R531 Weakness: Secondary | ICD-10-CM | POA: Diagnosis not present

## 2024-04-14 DIAGNOSIS — R2689 Other abnormalities of gait and mobility: Secondary | ICD-10-CM | POA: Diagnosis not present

## 2024-04-14 DIAGNOSIS — Z7409 Other reduced mobility: Secondary | ICD-10-CM | POA: Diagnosis not present

## 2024-04-14 DIAGNOSIS — R4189 Other symptoms and signs involving cognitive functions and awareness: Secondary | ICD-10-CM | POA: Diagnosis not present

## 2024-04-14 DIAGNOSIS — E113393 Type 2 diabetes mellitus with moderate nonproliferative diabetic retinopathy without macular edema, bilateral: Secondary | ICD-10-CM | POA: Diagnosis not present

## 2024-04-14 DIAGNOSIS — I5033 Acute on chronic diastolic (congestive) heart failure: Secondary | ICD-10-CM | POA: Diagnosis not present

## 2024-04-14 DIAGNOSIS — R339 Retention of urine, unspecified: Secondary | ICD-10-CM | POA: Diagnosis not present

## 2024-04-14 DIAGNOSIS — Z978 Presence of other specified devices: Secondary | ICD-10-CM | POA: Diagnosis not present

## 2024-04-14 NOTE — Progress Notes (Signed)
 1. Failure to attend appointment with reason given    Too soon to be seen for [facility]. Reschedule to insurance eligible visit. No charge.

## 2024-04-16 DIAGNOSIS — R531 Weakness: Secondary | ICD-10-CM | POA: Diagnosis not present

## 2024-04-16 DIAGNOSIS — Z794 Long term (current) use of insulin: Secondary | ICD-10-CM | POA: Diagnosis not present

## 2024-04-16 DIAGNOSIS — E1165 Type 2 diabetes mellitus with hyperglycemia: Secondary | ICD-10-CM | POA: Diagnosis not present

## 2024-04-16 DIAGNOSIS — Z7409 Other reduced mobility: Secondary | ICD-10-CM | POA: Diagnosis not present

## 2024-04-20 DIAGNOSIS — E1165 Type 2 diabetes mellitus with hyperglycemia: Secondary | ICD-10-CM | POA: Diagnosis not present

## 2024-04-20 DIAGNOSIS — R4189 Other symptoms and signs involving cognitive functions and awareness: Secondary | ICD-10-CM | POA: Diagnosis not present

## 2024-04-20 DIAGNOSIS — Z7409 Other reduced mobility: Secondary | ICD-10-CM | POA: Diagnosis not present

## 2024-04-20 DIAGNOSIS — Z794 Long term (current) use of insulin: Secondary | ICD-10-CM | POA: Diagnosis not present

## 2024-04-20 DIAGNOSIS — R531 Weakness: Secondary | ICD-10-CM | POA: Diagnosis not present

## 2024-04-22 DIAGNOSIS — N401 Enlarged prostate with lower urinary tract symptoms: Secondary | ICD-10-CM | POA: Diagnosis not present

## 2024-04-22 DIAGNOSIS — R338 Other retention of urine: Secondary | ICD-10-CM | POA: Diagnosis not present

## 2024-04-27 DIAGNOSIS — I5030 Unspecified diastolic (congestive) heart failure: Secondary | ICD-10-CM | POA: Diagnosis not present

## 2024-04-27 DIAGNOSIS — Z7901 Long term (current) use of anticoagulants: Secondary | ICD-10-CM | POA: Diagnosis not present

## 2024-04-27 DIAGNOSIS — E1151 Type 2 diabetes mellitus with diabetic peripheral angiopathy without gangrene: Secondary | ICD-10-CM | POA: Diagnosis not present

## 2024-04-27 DIAGNOSIS — I110 Hypertensive heart disease with heart failure: Secondary | ICD-10-CM | POA: Diagnosis not present

## 2024-04-27 DIAGNOSIS — N179 Acute kidney failure, unspecified: Secondary | ICD-10-CM | POA: Diagnosis not present

## 2024-04-27 DIAGNOSIS — Z1212 Encounter for screening for malignant neoplasm of rectum: Secondary | ICD-10-CM | POA: Diagnosis not present

## 2024-04-27 DIAGNOSIS — M109 Gout, unspecified: Secondary | ICD-10-CM | POA: Diagnosis not present

## 2024-04-27 DIAGNOSIS — R609 Edema, unspecified: Secondary | ICD-10-CM | POA: Diagnosis not present

## 2024-04-27 DIAGNOSIS — E7849 Other hyperlipidemia: Secondary | ICD-10-CM | POA: Diagnosis not present

## 2024-04-27 DIAGNOSIS — R339 Retention of urine, unspecified: Secondary | ICD-10-CM | POA: Diagnosis not present

## 2024-04-27 DIAGNOSIS — N400 Enlarged prostate without lower urinary tract symptoms: Secondary | ICD-10-CM | POA: Diagnosis not present

## 2024-04-27 DIAGNOSIS — I480 Paroxysmal atrial fibrillation: Secondary | ICD-10-CM | POA: Diagnosis not present

## 2024-04-27 DIAGNOSIS — I4891 Unspecified atrial fibrillation: Secondary | ICD-10-CM | POA: Diagnosis not present

## 2024-04-27 DIAGNOSIS — G4733 Obstructive sleep apnea (adult) (pediatric): Secondary | ICD-10-CM | POA: Diagnosis not present

## 2024-04-28 DIAGNOSIS — R972 Elevated prostate specific antigen [PSA]: Secondary | ICD-10-CM | POA: Diagnosis not present

## 2024-04-30 NOTE — Progress Notes (Signed)
 Chief Complaint: Patient was seen in consultation today for benign prostatic hyperplasia  Referring Physician(s): Winter,Christopher Thurston Flow  History of Present Illness: Raymond Gordon is a 73 y.o. male with a medical history significant for heart failure, atrial fibrillation on Aspirin /Eliquis , DM2, HTN, obesity, severe OSA and benign prostatic hyperplasia with elevated PSA. He was briefly hospitalized in April 2025 for atrial fibrillation with RVR complicated by heart failure exacerbation. He also suffered from an acute kidney injury with urinary retention requiring foley catheter placement. He was discharged from the hospital with the foley catheter in place.   The patient had a negative prostate biopsy in 2017. His PSA has ranged between 5.0 and 9.5 for the past several years. An MRI prostate from January 2023 showed no high risk lesions and a prostate volume of 173 cc. He was evaluated by Urology Apr 01, 2024 and he was still catheter dependent. He was started on Tamsulosin  0.4 mg BID and has been referred to Interventional Radiology to discuss prostate artery embolization.   Past Medical History:  Diagnosis Date   Bilateral lower extremity edema    Chronic diastolic CHF (congestive heart failure) (HCC) 08/15/2015   a. 06/2015: echo showing EF of 65-70% with Grade 2 DD   Diabetes (HCC)    INSULIN  DEPENDENT   Dysrhythmia    Hyperlipidemia    Hypertension    Obesity    Obesity    Paroxysmal atrial fibrillation (HCC)    Sleep apnea     Past Surgical History:  Procedure Laterality Date   CARDIAC CATHETERIZATION     CARDIAC CATHETERIZATION N/A 2012   no large vessel occlusions    Allergies: Patient has no known allergies.  Medications: Prior to Admission medications   Medication Sig Start Date End Date Taking? Authorizing Provider  allopurinol  (ZYLOPRIM ) 300 MG tablet Take 300 mg by mouth in the morning. 05/02/15   [provider]  apixaban  (ELIQUIS ) 5 MG TABS  tablet Take 1 tablet by mouth twice daily Patient taking differently: Take 5 mg by mouth 2 (two) times daily. 09/19/23   Avanell Leigh, MD  aspirin  EC 81 MG tablet Take 81 mg by mouth in the morning.    [provider]  atorvastatin  (LIPITOR ) 80 MG tablet Take 1 tablet by mouth once daily Patient taking differently: Take 80 mg by mouth in the morning. 06/18/23   Avanell Leigh, MD  doxazosin  (CARDURA ) 4 MG tablet Take 4 mg by mouth in the morning. 11/04/13   [provider]  finasteride  (PROSCAR ) 5 MG tablet Take 5 mg by mouth every evening. 04/11/22   [provider]  furosemide  (LASIX ) 80 MG tablet Take 1 tablet (80 mg total) by mouth 2 (two) times daily. Patient not taking: Reported on 03/31/2024 03/12/24   Arrien, Mauricio Daniel, MD  insulin  aspart (NOVOLOG ) 100 UNIT/ML injection Inject 0-20 Units into the skin 3 (three) times daily with meals. For glucose 121 to 150 use 3 units, for 151 to 200 use 4 units, for 201 to 250 use 7 units, for 251 to 300 use 11 units, for 301 to 350 use 15 units, for 351 to 400 use 20 units. 03/12/24   Arrien, Curlee Doss, MD  isosorbide  mononitrate (IMDUR ) 30 MG 24 hr tablet Take 1 tablet (30 mg total) by mouth daily. Patient taking differently: Take 30 mg by mouth in the morning. 08/22/15   Enrigue Harvard, DO  losartan  (COZAAR ) 100 MG tablet Take 100 mg by mouth in  the morning. 10/31/18   [provider]  metoprolol  tartrate (LOPRESSOR ) 25 MG tablet Take 1 tablet (25 mg total) by mouth 2 (two) times daily. 03/12/24 04/11/24  Arrien, Mauricio Daniel, MD  Multiple Vitamin (MULTIVITAMIN WITH MINERALS) TABS tablet Take 1 tablet by mouth in the morning.    [provider]  omeprazole  (PRILOSEC ) 20 MG capsule Take 20 mg by mouth in the morning. 10/05/18   [provider]  PRESCRIPTION MEDICATION Inhale into the lungs at bedtime. Pt is on C Pap machine    [provider]  spironolactone  (ALDACTONE ) 25 MG  tablet Take 25 mg by mouth in the morning. 04/26/22   [provider]     Family History  Problem Relation Age of Onset   Heart failure Brother    Hyperlipidemia Brother    Hypertension Brother    Stroke Brother    Hypertension Mother    Hyperlipidemia Mother    Diabetes Mother    Cancer Father        prostate   GER disease Father    Stroke Father    Hypertension Sister    Hyperlipidemia Sister    Hypertension Maternal Grandmother    Stroke Maternal Grandmother    Diabetes Maternal Grandfather    Hypertension Maternal Grandfather     Social History   Socioeconomic History   Marital status: Single    Spouse name: Not on file   Number of children: Not on file   Years of education: Not on file   Highest education level: Not on file  Occupational History   Not on file  Tobacco Use   Smoking status: Former    Current packs/day: 0.00    Types: Cigarettes    Quit date: 11/25/1997    Years since quitting: 26.4   Smokeless tobacco: Former    Quit date: 06/14/2015  Substance and Sexual Activity   Alcohol  use: No    Alcohol /week: 0.0 standard drinks of alcohol    Drug use: No   Sexual activity: Not on file  Other Topics Concern   Not on file  Social History Narrative   Not on file   Social Drivers of Health   Financial Resource Strain: Not on file  Food Insecurity: No Food Insecurity (03/09/2024)   Hunger Vital Sign    Worried About Running Out of Food in the Last Year: Never true    Ran Out of Food in the Last Year: Never true  Transportation Needs: No Transportation Needs (03/09/2024)   PRAPARE - Administrator, Civil Service (Medical): No    Lack of Transportation (Non-Medical): No  Physical Activity: Not on file  Stress: Not on file  Social Connections: Moderately Integrated (03/09/2024)   Social Connection and Isolation Panel [NHANES]    Frequency of Communication with Friends and Family: More than three times a week    Frequency of Social  Gatherings with Friends and Family: Twice a week    Attends Religious Services: More than 4 times per year    Active Member of [employer] or Organizations: Yes    Attends Engineer, structural: More than 4 times per year    Marital Status: Divorced    Review of Systems: A 12 point ROS discussed and pertinent positives are indicated in the HPI above.  All other systems are negative.  Review of Systems  Vital Signs: There were no vitals taken for this visit.  Advance Care Plan: The advanced care plan/surrogate decision maker  was discussed at the time of visit and documented in the medical record.    Physical Exam  Imaging:  MR Prostate 11/29/21   Labs:  CBC: Recent Labs    03/08/24 0954 03/09/24 0926 03/10/24 0236  WBC 7.2 8.1 7.2  HGB 12.0* 12.0* 10.8*  HCT 38.8* 37.7* 34.6*  PLT 295 334 310    COAGS: No results for input(s): "INR", "APTT" in the last 8760 hours.  BMP: Recent Labs    03/09/24 0926 03/10/24 0236 03/11/24 0244 03/12/24 0247  NA 145 141 142 142  K 4.4 4.0 4.0 4.5  CL 113* 112* 111 113*  CO2 20* 21* 23 22  GLUCOSE 89 144* 178* 163*  BUN 23 31* 31* 26*  CALCIUM  8.8* 8.5* 8.5* 8.9  CREATININE 1.42* 1.79* 1.24 1.07  GFRNONAA 52* 40* >60 >60    LIVER FUNCTION TESTS: Recent Labs    03/08/24 0954  BILITOT 0.7  AST 14*  ALT 12  ALKPHOS 67  PROT 6.9  ALBUMIN 2.9*    TUMOR MARKERS: No results for input(s): "AFPTM", "CEA", "CA199", "CHROMGRNA" in the last 8760 hours.  Assessment and Plan:  73 year old male with a history of benign prostatic hyperplasia with lower urinary tract symptoms. He has been foley catheter dependent since his hospitalization in April.  Thank you for this interesting consult.  I greatly enjoyed meeting NIKKOLAS COOMES and look forward to participating in their care.  A copy of this report was sent to the requesting provider on this date.   Creasie Doctor, MD Pager: [phone]    I spent a total of  40  Minutes   in face to face in clinical consultation, greater than 50% of which was counseling/coordinating care for benign prostatic hyperplasia with lower urinary tract symptoms.

## 2024-05-03 ENCOUNTER — Ambulatory Visit
Admission: RE | Admit: 2024-05-03 | Discharge: 2024-05-03 | Disposition: A | Discharge status: Home / Self Care | Source: Ambulatory Visit | Attending: Urology | Admitting: Urology

## 2024-05-03 DIAGNOSIS — N401 Enlarged prostate with lower urinary tract symptoms: Secondary | ICD-10-CM | POA: Diagnosis not present

## 2024-05-03 DIAGNOSIS — R338 Other retention of urine: Secondary | ICD-10-CM | POA: Diagnosis not present

## 2024-05-03 HISTORY — PX: IR RADIOLOGIST EVAL & MGMT: IMG5224

## 2024-05-04 ENCOUNTER — Encounter: Payer: Self-pay | Admitting: Podiatry

## 2024-05-04 ENCOUNTER — Ambulatory Visit (INDEPENDENT_AMBULATORY_CARE_PROVIDER_SITE_OTHER): Admitting: Podiatry

## 2024-05-04 DIAGNOSIS — M79674 Pain in right toe(s): Secondary | ICD-10-CM

## 2024-05-04 DIAGNOSIS — E1151 Type 2 diabetes mellitus with diabetic peripheral angiopathy without gangrene: Secondary | ICD-10-CM | POA: Diagnosis not present

## 2024-05-04 DIAGNOSIS — B351 Tinea unguium: Secondary | ICD-10-CM | POA: Diagnosis not present

## 2024-05-04 DIAGNOSIS — M79675 Pain in left toe(s): Secondary | ICD-10-CM | POA: Diagnosis not present

## 2024-05-04 DIAGNOSIS — I890 Lymphedema, not elsewhere classified: Secondary | ICD-10-CM

## 2024-05-04 NOTE — Progress Notes (Signed)
 This patient returns to my office for at risk foot care.  This patient requires this care by a professional since this patient will be at risk due to having AKI, chronic antocoagulation due to eliquis  and diabetes.  He presents to the office with his sister.  This patient is unable to cut nails himself since the patient cannot reach his nails.These nails are painful walking and wearing shoes.  This patient presents for at risk foot care today.  General Appearance  Alert, conversant and in no acute stress.  Vascular  Dorsalis pedis and posterior tibial  pulses are  not palpable due to swelling caused by lymphedema.bilaterally.  Capillary return is within normal limits  bilaterally. Temperature is within normal limits  bilaterally.  Neurologic  Senn-Weinstein monofilament wire test within normal limits  bilaterally. Muscle power within normal limits bilaterally.  Nails Thick disfigured discolored nails with subungual debris  from hallux to fifth toes bilaterally. No evidence of bacterial infection or drainage bilaterally.  Orthopedic  No limitations of motion  feet .  No crepitus or effusions noted.  No bony pathology or digital deformities noted.  Skin  normotropic skin with no porokeratosis noted bilaterally.  No signs of infections or ulcers noted.     Onychomycosis  Pain in right toes  Pain in left toes  Consent was obtained for treatment procedures.   Mechanical debridement of nails 1-5  bilaterally performed with a nail nipper.  Filed with dremel without incident. The fifth toenail right foot is detached and bleeding.  Neosporin/DSD.  RTC 3 months.  Patient to attempt to remake her consult due to the fact he was hospitalized at the time of his lymphedema appointment.   Return office visit     3 months for nail care.                Told patient to return for periodic foot care and evaluation due to potential at risk complications.   Ruffin Cotton DPM

## 2024-05-05 ENCOUNTER — Other Ambulatory Visit (HOSPITAL_COMMUNITY): Payer: Self-pay | Admitting: Interventional Radiology

## 2024-05-05 DIAGNOSIS — N401 Enlarged prostate with lower urinary tract symptoms: Secondary | ICD-10-CM

## 2024-05-07 ENCOUNTER — Telehealth: Payer: Self-pay | Admitting: Cardiovascular Disease

## 2024-05-07 DIAGNOSIS — R262 Difficulty in walking, not elsewhere classified: Secondary | ICD-10-CM | POA: Diagnosis not present

## 2024-05-07 DIAGNOSIS — N189 Chronic kidney disease, unspecified: Secondary | ICD-10-CM | POA: Diagnosis not present

## 2024-05-07 DIAGNOSIS — G4730 Sleep apnea, unspecified: Secondary | ICD-10-CM | POA: Diagnosis not present

## 2024-05-07 DIAGNOSIS — Z6841 Body Mass Index (BMI) 40.0 and over, adult: Secondary | ICD-10-CM | POA: Diagnosis not present

## 2024-05-07 DIAGNOSIS — I130 Hypertensive heart and chronic kidney disease with heart failure and stage 1 through stage 4 chronic kidney disease, or unspecified chronic kidney disease: Secondary | ICD-10-CM | POA: Diagnosis not present

## 2024-05-07 DIAGNOSIS — I509 Heart failure, unspecified: Secondary | ICD-10-CM | POA: Diagnosis not present

## 2024-05-07 DIAGNOSIS — Z87891 Personal history of nicotine dependence: Secondary | ICD-10-CM | POA: Diagnosis not present

## 2024-05-07 DIAGNOSIS — Z556 Problems related to health literacy: Secondary | ICD-10-CM | POA: Diagnosis not present

## 2024-05-07 DIAGNOSIS — I5032 Chronic diastolic (congestive) heart failure: Secondary | ICD-10-CM | POA: Diagnosis not present

## 2024-05-07 DIAGNOSIS — I4891 Unspecified atrial fibrillation: Secondary | ICD-10-CM | POA: Diagnosis not present

## 2024-05-07 DIAGNOSIS — I5033 Acute on chronic diastolic (congestive) heart failure: Secondary | ICD-10-CM | POA: Diagnosis not present

## 2024-05-07 DIAGNOSIS — D631 Anemia in chronic kidney disease: Secondary | ICD-10-CM | POA: Diagnosis not present

## 2024-05-07 DIAGNOSIS — E785 Hyperlipidemia, unspecified: Secondary | ICD-10-CM | POA: Diagnosis not present

## 2024-05-07 DIAGNOSIS — I10 Essential (primary) hypertension: Secondary | ICD-10-CM

## 2024-05-07 DIAGNOSIS — F0390 Unspecified dementia without behavioral disturbance: Secondary | ICD-10-CM | POA: Diagnosis not present

## 2024-05-07 DIAGNOSIS — Z466 Encounter for fitting and adjustment of urinary device: Secondary | ICD-10-CM | POA: Diagnosis not present

## 2024-05-07 DIAGNOSIS — R338 Other retention of urine: Secondary | ICD-10-CM | POA: Diagnosis not present

## 2024-05-07 DIAGNOSIS — E669 Obesity, unspecified: Secondary | ICD-10-CM | POA: Diagnosis not present

## 2024-05-07 DIAGNOSIS — I480 Paroxysmal atrial fibrillation: Secondary | ICD-10-CM

## 2024-05-07 DIAGNOSIS — E1165 Type 2 diabetes mellitus with hyperglycemia: Secondary | ICD-10-CM | POA: Diagnosis not present

## 2024-05-07 DIAGNOSIS — E782 Mixed hyperlipidemia: Secondary | ICD-10-CM

## 2024-05-07 DIAGNOSIS — G4733 Obstructive sleep apnea (adult) (pediatric): Secondary | ICD-10-CM

## 2024-05-07 DIAGNOSIS — N401 Enlarged prostate with lower urinary tract symptoms: Secondary | ICD-10-CM | POA: Diagnosis not present

## 2024-05-07 NOTE — Telephone Encounter (Signed)
 Sister is calling for Dr Katheryne Pane to send in a new order for Cpap supplies. Pt uses [facility]

## 2024-05-12 ENCOUNTER — Other Ambulatory Visit

## 2024-05-13 DIAGNOSIS — I5030 Unspecified diastolic (congestive) heart failure: Secondary | ICD-10-CM | POA: Diagnosis not present

## 2024-05-13 DIAGNOSIS — M109 Gout, unspecified: Secondary | ICD-10-CM | POA: Diagnosis not present

## 2024-05-13 DIAGNOSIS — D6869 Other thrombophilia: Secondary | ICD-10-CM | POA: Diagnosis not present

## 2024-05-13 DIAGNOSIS — L850 Acquired ichthyosis: Secondary | ICD-10-CM | POA: Diagnosis not present

## 2024-05-13 DIAGNOSIS — G4733 Obstructive sleep apnea (adult) (pediatric): Secondary | ICD-10-CM | POA: Diagnosis not present

## 2024-05-13 DIAGNOSIS — I480 Paroxysmal atrial fibrillation: Secondary | ICD-10-CM | POA: Diagnosis not present

## 2024-05-13 DIAGNOSIS — R972 Elevated prostate specific antigen [PSA]: Secondary | ICD-10-CM | POA: Diagnosis not present

## 2024-05-13 DIAGNOSIS — I209 Angina pectoris, unspecified: Secondary | ICD-10-CM | POA: Diagnosis not present

## 2024-05-13 DIAGNOSIS — Z6841 Body Mass Index (BMI) 40.0 and over, adult: Secondary | ICD-10-CM | POA: Diagnosis not present

## 2024-05-13 DIAGNOSIS — I110 Hypertensive heart disease with heart failure: Secondary | ICD-10-CM | POA: Diagnosis not present

## 2024-05-13 DIAGNOSIS — Z1339 Encounter for screening examination for other mental health and behavioral disorders: Secondary | ICD-10-CM | POA: Diagnosis not present

## 2024-05-13 DIAGNOSIS — Z1331 Encounter for screening for depression: Secondary | ICD-10-CM | POA: Diagnosis not present

## 2024-05-13 DIAGNOSIS — Z794 Long term (current) use of insulin: Secondary | ICD-10-CM | POA: Diagnosis not present

## 2024-05-13 DIAGNOSIS — E113393 Type 2 diabetes mellitus with moderate nonproliferative diabetic retinopathy without macular edema, bilateral: Secondary | ICD-10-CM | POA: Diagnosis not present

## 2024-05-13 DIAGNOSIS — Z7901 Long term (current) use of anticoagulants: Secondary | ICD-10-CM | POA: Diagnosis not present

## 2024-05-13 DIAGNOSIS — E1151 Type 2 diabetes mellitus with diabetic peripheral angiopathy without gangrene: Secondary | ICD-10-CM | POA: Diagnosis not present

## 2024-05-13 DIAGNOSIS — Z0000 Encounter for general adult medical examination without abnormal findings: Secondary | ICD-10-CM | POA: Diagnosis not present

## 2024-05-24 DIAGNOSIS — R310 Gross hematuria: Secondary | ICD-10-CM | POA: Diagnosis not present

## 2024-05-24 DIAGNOSIS — R338 Other retention of urine: Secondary | ICD-10-CM | POA: Diagnosis not present

## 2024-05-31 ENCOUNTER — Inpatient Hospital Stay (HOSPITAL_COMMUNITY)
Admission: EM | Admit: 2024-05-31 | Discharge: 2024-06-10 | DRG: 683 | Disposition: A | Discharge status: Home Health | Attending: Internal Medicine | Admitting: Internal Medicine

## 2024-05-31 ENCOUNTER — Other Ambulatory Visit: Payer: Self-pay

## 2024-05-31 ENCOUNTER — Encounter (HOSPITAL_COMMUNITY): Payer: Self-pay | Admitting: Internal Medicine

## 2024-05-31 ENCOUNTER — Emergency Department (HOSPITAL_COMMUNITY)

## 2024-05-31 ENCOUNTER — Observation Stay (HOSPITAL_COMMUNITY)

## 2024-05-31 DIAGNOSIS — E66813 Obesity, class 3: Secondary | ICD-10-CM | POA: Diagnosis not present

## 2024-05-31 DIAGNOSIS — I10 Essential (primary) hypertension: Secondary | ICD-10-CM | POA: Diagnosis not present

## 2024-05-31 DIAGNOSIS — I959 Hypotension, unspecified: Secondary | ICD-10-CM | POA: Diagnosis not present

## 2024-05-31 DIAGNOSIS — D6859 Other primary thrombophilia: Secondary | ICD-10-CM | POA: Diagnosis present

## 2024-05-31 DIAGNOSIS — R296 Repeated falls: Secondary | ICD-10-CM | POA: Diagnosis present

## 2024-05-31 DIAGNOSIS — Z7901 Long term (current) use of anticoagulants: Secondary | ICD-10-CM

## 2024-05-31 DIAGNOSIS — E7800 Pure hypercholesterolemia, unspecified: Secondary | ICD-10-CM | POA: Diagnosis not present

## 2024-05-31 DIAGNOSIS — E1165 Type 2 diabetes mellitus with hyperglycemia: Secondary | ICD-10-CM | POA: Diagnosis present

## 2024-05-31 DIAGNOSIS — R338 Other retention of urine: Secondary | ICD-10-CM | POA: Diagnosis present

## 2024-05-31 DIAGNOSIS — E785 Hyperlipidemia, unspecified: Secondary | ICD-10-CM | POA: Diagnosis not present

## 2024-05-31 DIAGNOSIS — I509 Heart failure, unspecified: Secondary | ICD-10-CM | POA: Diagnosis not present

## 2024-05-31 DIAGNOSIS — B9689 Other specified bacterial agents as the cause of diseases classified elsewhere: Secondary | ICD-10-CM | POA: Diagnosis present

## 2024-05-31 DIAGNOSIS — N401 Enlarged prostate with lower urinary tract symptoms: Secondary | ICD-10-CM | POA: Diagnosis present

## 2024-05-31 DIAGNOSIS — R0989 Other specified symptoms and signs involving the circulatory and respiratory systems: Secondary | ICD-10-CM | POA: Diagnosis not present

## 2024-05-31 DIAGNOSIS — Z87891 Personal history of nicotine dependence: Secondary | ICD-10-CM

## 2024-05-31 DIAGNOSIS — Z1619 Resistance to other specified beta lactam antibiotics: Secondary | ICD-10-CM | POA: Diagnosis present

## 2024-05-31 DIAGNOSIS — Z8249 Family history of ischemic heart disease and other diseases of the circulatory system: Secondary | ICD-10-CM

## 2024-05-31 DIAGNOSIS — E1169 Type 2 diabetes mellitus with other specified complication: Secondary | ICD-10-CM | POA: Diagnosis present

## 2024-05-31 DIAGNOSIS — Z833 Family history of diabetes mellitus: Secondary | ICD-10-CM

## 2024-05-31 DIAGNOSIS — E872 Acidosis, unspecified: Secondary | ICD-10-CM | POA: Diagnosis present

## 2024-05-31 DIAGNOSIS — G4733 Obstructive sleep apnea (adult) (pediatric): Secondary | ICD-10-CM | POA: Diagnosis present

## 2024-05-31 DIAGNOSIS — M109 Gout, unspecified: Secondary | ICD-10-CM | POA: Diagnosis present

## 2024-05-31 DIAGNOSIS — E860 Dehydration: Secondary | ICD-10-CM | POA: Diagnosis present

## 2024-05-31 DIAGNOSIS — N179 Acute kidney failure, unspecified: Secondary | ICD-10-CM | POA: Diagnosis not present

## 2024-05-31 DIAGNOSIS — I110 Hypertensive heart disease with heart failure: Secondary | ICD-10-CM | POA: Diagnosis present

## 2024-05-31 DIAGNOSIS — Z79899 Other long term (current) drug therapy: Secondary | ICD-10-CM

## 2024-05-31 DIAGNOSIS — R9082 White matter disease, unspecified: Secondary | ICD-10-CM | POA: Diagnosis not present

## 2024-05-31 DIAGNOSIS — R55 Syncope and collapse: Secondary | ICD-10-CM | POA: Diagnosis not present

## 2024-05-31 DIAGNOSIS — R42 Dizziness and giddiness: Secondary | ICD-10-CM | POA: Diagnosis not present

## 2024-05-31 DIAGNOSIS — S0990XA Unspecified injury of head, initial encounter: Secondary | ICD-10-CM | POA: Diagnosis not present

## 2024-05-31 DIAGNOSIS — R9431 Abnormal electrocardiogram [ECG] [EKG]: Secondary | ICD-10-CM | POA: Diagnosis present

## 2024-05-31 DIAGNOSIS — W19XXXA Unspecified fall, initial encounter: Secondary | ICD-10-CM | POA: Diagnosis not present

## 2024-05-31 DIAGNOSIS — E875 Hyperkalemia: Secondary | ICD-10-CM | POA: Diagnosis present

## 2024-05-31 DIAGNOSIS — Z83438 Family history of other disorder of lipoprotein metabolism and other lipidemia: Secondary | ICD-10-CM

## 2024-05-31 DIAGNOSIS — Z9181 History of falling: Secondary | ICD-10-CM

## 2024-05-31 DIAGNOSIS — Z794 Long term (current) use of insulin: Secondary | ICD-10-CM

## 2024-05-31 DIAGNOSIS — I480 Paroxysmal atrial fibrillation: Secondary | ICD-10-CM | POA: Diagnosis not present

## 2024-05-31 DIAGNOSIS — N390 Urinary tract infection, site not specified: Secondary | ICD-10-CM | POA: Diagnosis present

## 2024-05-31 DIAGNOSIS — I951 Orthostatic hypotension: Secondary | ICD-10-CM | POA: Diagnosis present

## 2024-05-31 DIAGNOSIS — Z823 Family history of stroke: Secondary | ICD-10-CM

## 2024-05-31 DIAGNOSIS — I5032 Chronic diastolic (congestive) heart failure: Secondary | ICD-10-CM | POA: Diagnosis present

## 2024-05-31 DIAGNOSIS — Z6841 Body Mass Index (BMI) 40.0 and over, adult: Secondary | ICD-10-CM

## 2024-05-31 LAB — CBC WITH DIFFERENTIAL/PLATELET
Abs Immature Granulocytes: 0.03 K/uL (ref 0.00–0.07)
Basophils Absolute: 0 K/uL (ref 0.0–0.1)
Basophils Relative: 0 %
Eosinophils Absolute: 0.1 K/uL (ref 0.0–0.5)
Eosinophils Relative: 1 %
HCT: 37.5 % — ABNORMAL LOW (ref 39.0–52.0)
Hemoglobin: 12.1 g/dL — ABNORMAL LOW (ref 13.0–17.0)
Immature Granulocytes: 1 %
Lymphocytes Relative: 22 %
Lymphs Abs: 1.3 K/uL (ref 0.7–4.0)
MCH: 27.9 pg (ref 26.0–34.0)
MCHC: 32.3 g/dL (ref 30.0–36.0)
MCV: 86.4 fL (ref 80.0–100.0)
Monocytes Absolute: 0.5 K/uL (ref 0.1–1.0)
Monocytes Relative: 7 %
Neutro Abs: 4.2 K/uL (ref 1.7–7.7)
Neutrophils Relative %: 69 %
Platelets: 319 K/uL (ref 150–400)
RBC: 4.34 MIL/uL (ref 4.22–5.81)
RDW: 16.3 % — ABNORMAL HIGH (ref 11.5–15.5)
WBC: 6.1 K/uL (ref 4.0–10.5)
nRBC: 0 % (ref 0.0–0.2)

## 2024-05-31 LAB — URINALYSIS, W/ REFLEX TO CULTURE (INFECTION SUSPECTED)
Bilirubin Urine: NEGATIVE
Glucose, UA: NEGATIVE mg/dL
Ketones, ur: NEGATIVE mg/dL
Nitrite: NEGATIVE
Protein, ur: NEGATIVE mg/dL
Specific Gravity, Urine: 1.012 (ref 1.005–1.030)
pH: 5 (ref 5.0–8.0)

## 2024-05-31 LAB — CBG MONITORING, ED
Glucose-Capillary: 144 mg/dL — ABNORMAL HIGH (ref 70–99)
Glucose-Capillary: 160 mg/dL — ABNORMAL HIGH (ref 70–99)

## 2024-05-31 LAB — COMPREHENSIVE METABOLIC PANEL WITH GFR
ALT: 25 U/L (ref 0–44)
AST: 29 U/L (ref 15–41)
Albumin: 3.3 g/dL — ABNORMAL LOW (ref 3.5–5.0)
Alkaline Phosphatase: 87 U/L (ref 38–126)
Anion gap: 15 (ref 5–15)
BUN: 127 mg/dL — ABNORMAL HIGH (ref 8–23)
CO2: 20 mmol/L — ABNORMAL LOW (ref 22–32)
Calcium: 9.8 mg/dL (ref 8.9–10.3)
Chloride: 98 mmol/L (ref 98–111)
Creatinine, Ser: 2.66 mg/dL — ABNORMAL HIGH (ref 0.61–1.24)
GFR, Estimated: 24 mL/min — ABNORMAL LOW (ref >60)
Glucose, Bld: 147 mg/dL — ABNORMAL HIGH (ref 70–99)
Potassium: 5.3 mmol/L — ABNORMAL HIGH (ref 3.5–5.1)
Sodium: 133 mmol/L — ABNORMAL LOW (ref 135–145)
Total Bilirubin: 0.7 mg/dL (ref 0.0–1.2)
Total Protein: 8.3 g/dL — ABNORMAL HIGH (ref 6.5–8.1)

## 2024-05-31 LAB — TSH: TSH: 4.864 u[IU]/mL — ABNORMAL HIGH (ref 0.350–4.500)

## 2024-05-31 LAB — MAGNESIUM: Magnesium: 2.3 mg/dL (ref 1.7–2.4)

## 2024-05-31 LAB — LACTIC ACID, PLASMA: Lactic Acid, Venous: 1.5 mmol/L (ref 0.5–1.9)

## 2024-05-31 LAB — TROPONIN I (HIGH SENSITIVITY)
Troponin I (High Sensitivity): 9 ng/L (ref <18)
Troponin I (High Sensitivity): 9 ng/L (ref <18)

## 2024-05-31 MED ORDER — ACETAMINOPHEN 325 MG PO TABS: 650.0000 mg | ORAL_TABLET | Freq: Four times a day (QID) | ORAL | Status: DC | PRN

## 2024-05-31 MED ORDER — POLYETHYLENE GLYCOL 3350 17 G PO PACK: 17.0000 g | PACK | Freq: Every day | ORAL | Status: DC | PRN

## 2024-05-31 MED ORDER — PANTOPRAZOLE SODIUM 40 MG PO TBEC
40.0000 mg | DELAYED_RELEASE_TABLET | Freq: Every day | ORAL | Status: DC
  Administered 2024-06-01 – 2024-06-10 (×10): 40 mg via ORAL
  Filled 2024-05-31 (×10): qty 1

## 2024-05-31 MED ORDER — SODIUM CHLORIDE 0.9 % IV SOLN: INTRAVENOUS | Status: AC

## 2024-05-31 MED ORDER — FINASTERIDE 5 MG PO TABS
5.0000 mg | ORAL_TABLET | Freq: Every evening | ORAL | Status: DC
  Administered 2024-05-31 – 2024-06-09 (×10): 5 mg via ORAL
  Filled 2024-05-31 (×10): qty 1

## 2024-05-31 MED ORDER — ACETAMINOPHEN 650 MG RE SUPP
650.0000 mg | Freq: Four times a day (QID) | RECTAL | Status: DC | PRN
Start: 2024-05-31 — End: 2024-06-10

## 2024-05-31 MED ORDER — INSULIN ASPART 100 UNIT/ML IJ SOLN
0.0000 [IU] | Freq: Three times a day (TID) | INTRAMUSCULAR | Status: DC
  Administered 2024-05-31: 1 [IU] via SUBCUTANEOUS
  Administered 2024-06-01: 3 [IU] via SUBCUTANEOUS
  Administered 2024-06-01 (×2): 2 [IU] via SUBCUTANEOUS
  Administered 2024-06-02: 1 [IU] via SUBCUTANEOUS
  Administered 2024-06-02: 2 [IU] via SUBCUTANEOUS
  Administered 2024-06-02: 3 [IU] via SUBCUTANEOUS
  Administered 2024-06-03 (×2): 2 [IU] via SUBCUTANEOUS
  Administered 2024-06-03: 1 [IU] via SUBCUTANEOUS
  Administered 2024-06-04 – 2024-06-06 (×7): 2 [IU] via SUBCUTANEOUS
  Administered 2024-06-06: 1 [IU] via SUBCUTANEOUS
  Administered 2024-06-06 – 2024-06-07 (×2): 2 [IU] via SUBCUTANEOUS
  Administered 2024-06-07: 1 [IU] via SUBCUTANEOUS
  Administered 2024-06-07 – 2024-06-09 (×4): 2 [IU] via SUBCUTANEOUS
  Administered 2024-06-10: 1 [IU] via SUBCUTANEOUS

## 2024-05-31 MED ORDER — INSULIN GLARGINE-YFGN 100 UNIT/ML ~~LOC~~ SOLN
5.0000 [IU] | Freq: Every day | SUBCUTANEOUS | Status: DC
  Administered 2024-05-31: 5 [IU] via SUBCUTANEOUS
  Filled 2024-05-31 (×2): qty 0.05

## 2024-05-31 MED ORDER — SODIUM CHLORIDE 0.9% FLUSH
3.0000 mL | Freq: Two times a day (BID) | INTRAVENOUS | Status: DC
  Administered 2024-05-31 – 2024-06-10 (×19): 3 mL via INTRAVENOUS

## 2024-05-31 MED ORDER — APIXABAN 5 MG PO TABS
5.0000 mg | ORAL_TABLET | Freq: Two times a day (BID) | ORAL | Status: DC
  Administered 2024-05-31 – 2024-06-10 (×20): 5 mg via ORAL
  Filled 2024-05-31 (×20): qty 1

## 2024-05-31 MED ORDER — ISOSORBIDE MONONITRATE ER 30 MG PO TB24
30.0000 mg | ORAL_TABLET | Freq: Every day | ORAL | Status: DC
  Administered 2024-06-01 – 2024-06-03 (×3): 30 mg via ORAL
  Filled 2024-05-31 (×3): qty 1

## 2024-05-31 MED ORDER — TAMSULOSIN HCL 0.4 MG PO CAPS
0.4000 mg | ORAL_CAPSULE | Freq: Every day | ORAL | Status: DC
  Administered 2024-06-01 – 2024-06-03 (×3): 0.4 mg via ORAL
  Filled 2024-05-31 (×3): qty 1

## 2024-05-31 NOTE — ED Triage Notes (Addendum)
 Pt BIB [facility] from home c/o multiple falls. Had a dizzy spell and fall this am. EMS reports could not get accurate bp or heart rate.  80 palp  EMS some SOB walking to stretcher  Provider at bedside  On Eliquis 

## 2024-05-31 NOTE — ED Notes (Signed)
 Pt up to [facility]

## 2024-05-31 NOTE — ED Notes (Signed)
 CCM called for tele admit.

## 2024-05-31 NOTE — Progress Notes (Signed)
   05/31/24 2239  BiPAP/CPAP/SIPAP  $ Non-Invasive Home Ventilator  Initial  $ Face Mask Large  Yes  BiPAP/CPAP/SIPAP Pt Type Adult  BiPAP/CPAP/SIPAP DREAMSTATIOND  Mask Type Full face mask  Mask Size Large  Respiratory Rate 18 breaths/min  IPAP 15 cmH20  EPAP 6 cmH2O  FiO2 (%) 21 %  Patient Home Machine No  Patient Home Mask No  Patient Home Tubing No  Auto Titrate Yes  Minimum cmH2O 6 cmH2O  Maximum cmH2O 15 cmH2O  Device Plugged into RED Power Outlet Yes  BiPAP/CPAP /SiPAP Vitals  Pulse Rate 63  Resp 20  SpO2 100 %  MEWS Score/Color  MEWS Score 0  MEWS Score Color [place]

## 2024-05-31 NOTE — ED Provider Notes (Signed)
 [place] EMERGENCY DEPARTMENT AT [facility] Provider Note   CSN: [id number] Arrival date & time: 05/31/24  9098     Patient presents with: No chief complaint on file.   Raymond Gordon is a 73 y.o. male.   The history is provided by the patient and medical records. No language interpreter was used.  Near Syncope This is a recurrent problem. The current episode started more than 2 days ago. The problem occurs daily. The problem has not changed since onset.Pertinent negatives include no chest pain, no abdominal pain, no headaches and no shortness of breath. Nothing aggravates the symptoms. Nothing relieves the symptoms. He has tried nothing for the symptoms. The treatment provided no relief.       Prior to Admission medications   Medication Sig Start Date End Date Taking? Authorizing Provider  allopurinol  (ZYLOPRIM ) 300 MG tablet Take 300 mg by mouth in the morning. 05/02/15   [provider]  apixaban  (ELIQUIS ) 5 MG TABS tablet Take 1 tablet by mouth twice daily Patient taking differently: Take 5 mg by mouth 2 (two) times daily. 09/19/23   Court Dorn PARAS, MD  aspirin  EC 81 MG tablet Take 81 mg by mouth in the morning.    [provider]  atorvastatin  (LIPITOR ) 80 MG tablet Take 1 tablet by mouth once daily Patient taking differently: Take 80 mg by mouth in the morning. 06/18/23   Court Dorn PARAS, MD  doxazosin  (CARDURA ) 4 MG tablet Take 4 mg by mouth in the morning. 11/04/13   [provider]  finasteride  (PROSCAR ) 5 MG tablet Take 5 mg by mouth every evening. 04/11/22   [provider]  furosemide  (LASIX ) 80 MG tablet Take 1 tablet (80 mg total) by mouth 2 (two) times daily. 03/12/24   Arrien, Elidia Sieving, MD  insulin  aspart (NOVOLOG ) 100 UNIT/ML injection Inject 0-20 Units into the skin 3 (three) times daily with meals. For glucose 121 to 150 use 3 units, for 151 to 200 use 4 units, for 201 to 250 use 7 units, for 251 to 300 use 11  units, for 301 to 350 use 15 units, for 351 to 400 use 20 units. 03/12/24   Arrien, Mauricio Daniel, MD  isosorbide  mononitrate (IMDUR ) 30 MG 24 hr tablet Take 1 tablet (30 mg total) by mouth daily. Patient taking differently: Take 30 mg by mouth in the morning. 08/22/15   Juvenal Harlene PENNER, DO  losartan  (COZAAR ) 100 MG tablet Take 100 mg by mouth in the morning. 10/31/18   [provider]  metoprolol  tartrate (LOPRESSOR ) 25 MG tablet Take 1 tablet (25 mg total) by mouth 2 (two) times daily. 03/12/24 04/11/24  Arrien, Mauricio Daniel, MD  Multiple Vitamin (MULTIVITAMIN WITH MINERALS) TABS tablet Take 1 tablet by mouth in the morning.    [provider]  omeprazole  (PRILOSEC ) 20 MG capsule Take 20 mg by mouth in the morning. 10/05/18   [provider]  PRESCRIPTION MEDICATION Inhale into the lungs at bedtime. Pt is on C Pap machine    [provider]  spironolactone  (ALDACTONE ) 25 MG tablet Take 25 mg by mouth in the morning. 04/26/22   [provider]    Allergies: Patient has no known allergies.    Review of Systems  Constitutional:  Positive for fatigue. Negative for chills and fever.  HENT:  Negative for congestion.   Eyes:  Negative for visual disturbance.  Respiratory:  Negative for cough, chest tightness, shortness of breath and wheezing.  Cardiovascular:  Positive for leg swelling (chronic) and near-syncope. Negative for chest pain and palpitations.  Gastrointestinal:  Negative for abdominal pain, constipation, diarrhea, nausea and vomiting.  Genitourinary:  Negative for dysuria and flank pain.  Musculoskeletal:  Negative for back pain, neck pain and neck stiffness.  Skin:  Negative for rash and wound.  Neurological:  Positive for light-headedness. Negative for syncope, speech difficulty, weakness, numbness and headaches.  Psychiatric/Behavioral:  Negative for agitation and confusion.   All other systems reviewed and are negative.   Updated  Vital Signs There were no vitals taken for this visit.  Physical Exam Vitals and nursing note reviewed.  Constitutional:      General: He is not in acute distress.    Appearance: He is well-developed. He is not ill-appearing, toxic-appearing or diaphoretic.  HENT:     Head: Normocephalic and atraumatic.     Nose: No congestion or rhinorrhea.     Mouth/Throat:     Mouth: Mucous membranes are moist.     Pharynx: No oropharyngeal exudate or posterior oropharyngeal erythema.  Eyes:     Extraocular Movements: Extraocular movements intact.     Conjunctiva/sclera: Conjunctivae normal.     Pupils: Pupils are equal, round, and reactive to light.  Cardiovascular:     Rate and Rhythm: Normal rate and regular rhythm.     Heart sounds: No murmur heard. Pulmonary:     Effort: Pulmonary effort is normal. No respiratory distress.     Breath sounds: Normal breath sounds. No wheezing, rhonchi or rales.  Chest:     Chest wall: No tenderness.  Abdominal:     General: Abdomen is flat.     Palpations: Abdomen is soft.     Tenderness: There is no abdominal tenderness. There is no right CVA tenderness, left CVA tenderness, guarding or rebound.  Musculoskeletal:        General: No swelling or tenderness.     Cervical back: Neck supple. No tenderness.     Right lower leg: No edema.     Left lower leg: No edema.  Skin:    General: Skin is warm and dry.     Capillary Refill: Capillary refill takes less than 2 seconds.     Findings: No erythema or rash.  Neurological:     General: No focal deficit present.     Mental Status: He is alert.     Sensory: No sensory deficit.     Motor: No weakness.  Psychiatric:        Mood and Affect: Mood normal.     (all labs ordered are listed, but only abnormal results are displayed) Labs Reviewed  CBC WITH DIFFERENTIAL/PLATELET - Abnormal; Notable for the following components:      Result Value   Hemoglobin 12.1 (*)    HCT 37.5 (*)    RDW 16.3 (*)    All  other components within normal limits  COMPREHENSIVE METABOLIC PANEL WITH GFR - Abnormal; Notable for the following components:   Sodium 133 (*)    Potassium 5.3 (*)    CO2 20 (*)    Glucose, Bld 147 (*)    BUN 127 (*)    Creatinine, Ser 2.66 (*)    Total Protein 8.3 (*)    Albumin 3.3 (*)    GFR, Estimated 24 (*)    All other components within normal limits  TSH - Abnormal; Notable for the following components:   TSH 4.864 (*)    All other components within  normal limits  CBG MONITORING, ED - Abnormal; Notable for the following components:   Glucose-Capillary 144 (*)    All other components within normal limits  MAGNESIUM   LACTIC ACID, PLASMA  BRAIN NATRIURETIC PEPTIDE  URINALYSIS, W/ REFLEX TO CULTURE (INFECTION SUSPECTED)  LACTIC ACID, PLASMA  COMPREHENSIVE METABOLIC PANEL WITH GFR  CBC  TROPONIN I (HIGH SENSITIVITY)  TROPONIN I (HIGH SENSITIVITY)    EKG: EKG Interpretation Date/Time:  Monday May 31 2024 09:07:38 EDT Ventricular Rate:  65 PR Interval:  141 QRS Duration:  117 QT Interval:  435 QTC Calculation: 453 R Axis:   41  Text Interpretation: Sinus rhythm Nonspecific intraventricular conduction delay Abnormal inferior Q waves Minimal ST depression, inferior leads when compared to prior, similar arate No STEMI Confirmed by Ginger Barefoot ([id number]) on 05/31/2024 9:49:52 AM  Radiology: CT Head Wo Contrast Result Date: 05/31/2024 CLINICAL DATA:  Head trauma.  Multiple falls. EXAM: CT HEAD WITHOUT CONTRAST TECHNIQUE: Contiguous axial images were obtained from the base of the skull through the vertex without intravenous contrast. RADIATION DOSE REDUCTION: This exam was performed according to the departmental dose-optimization program which includes automated exposure control, adjustment of the mA and/or kV according to patient size and/or use of iterative reconstruction technique. COMPARISON:  None Available. FINDINGS: Brain: No acute intracranial hemorrhage. No focal mass  lesion. No CT evidence of acute infarction. No midline shift or mass effect. No hydrocephalus. Basilar cisterns are patent. There are periventricular and subcortical white matter hypodensities. Generalized cortical atrophy. Vascular: No hyperdense vessel or unexpected calcification. Skull: Normal. Negative for fracture or focal lesion. Sinuses/Orbits: Paranasal sinuses and mastoid air cells are clear. Orbits are clear. Other: None. IMPRESSION: 1. No acute intracranial findings. 2. Atrophy and white matter microvascular disease. Electronically Signed   By: Jackquline Boxer M.D.   On: 05/31/2024 10:35   DG Chest Portable 1 View Result Date: 05/31/2024 CLINICAL DATA:  hypotension and near syncope, falls, chf. EXAM: PORTABLE CHEST 1 VIEW COMPARISON:  03/08/2024. FINDINGS: Low lung volume. Bilateral lung fields are clear. Bilateral costophrenic angles are clear. Stable cardio-mediastinal silhouette. No acute osseous abnormalities. The soft tissues are within normal limits. IMPRESSION: No active disease. Electronically Signed   By: Ree Molt M.D.   On: 05/31/2024 09:46     Procedures   Medications Ordered in the ED  isosorbide  mononitrate (IMDUR ) 24 hr tablet 30 mg (has no administration in time range)  pantoprazole  (PROTONIX ) EC tablet 40 mg (has no administration in time range)  finasteride  (PROSCAR ) tablet 5 mg (has no administration in time range)  tamsulosin  (FLOMAX ) capsule 0.4 mg (has no administration in time range)  apixaban  (ELIQUIS ) tablet 5 mg (has no administration in time range)  sodium chloride  flush (NS) 0.9 % injection 3 mL (has no administration in time range)  acetaminophen  (TYLENOL ) tablet 650 mg (has no administration in time range)    Or  acetaminophen  (TYLENOL ) suppository 650 mg (has no administration in time range)  polyethylene glycol (MIRALAX  / GLYCOLAX ) packet 17 g (has no administration in time range)  0.9 %  sodium chloride  infusion ( Intravenous New Bag/Given 05/31/24  1654)  insulin  aspart (novoLOG ) injection 0-9 Units (1 Units Subcutaneous Given 05/31/24 1701)  insulin  glargine-yfgn (SEMGLEE ) injection 5 Units (has no administration in time range)                                    Medical Decision Making  Amount and/or Complexity of Data Reviewed Labs: ordered. Radiology: ordered.  Risk Decision regarding hospitalization.    CAPTAIN BLUCHER is a 73 y.o. male with a past medical history significant for paroxysmal atrial fibrillation on Eliquis  therapy, hypertension, hyperlipidemia, diabetes, pulmonary edema, CHF, and Foley catheter use with urinary retention who presents with multiple falls over the last few days due to lightheadedness.  According to patient, he has had multiple falls with last few days but did not hit his head.  He reports feeling fatigued and tired and denies any pain or injuries from the falls.  He did not hit his head but he is on a blood thinners.  He does feel slightly dizzy but mainly lightheaded at times.  EMS report his blood pressure was 80 over palp and they could not get a good pulse reading.  Patient says that he has had no fevers, chills, congestion, cough, nausea, vomiting, constipation, diarrhea, or any appearance and urine change.  He reports his legs are chronically edematous and they do not seem worse to him but he just feels lightheaded.  Denies chest pain palpitations or persistent shortness of breath.  Denies any sick contacts.  Denies medication changes.  On arrival, blood pressure is over 100 systolic.  Lungs were clear and chest was nontender.  Abdomen nontender.  No focal neurologic deficit initially.  Does have edema in legs but has intact pulses on my exam.  Patient resting comfortably.  Given the patient's hypotension and near syncope, we will get workup to look for occult infection or other problem leading to this.  With his falls, will get a head CT given his blood thinner use.  Will get urinalysis chest x-ray and  some screening labs.  Will hold on fluids as his blood pressure has improved and he does appear slightly edematous so as not to fluid overload him and cause hypoxia as it appears he has been in the past with fluid overload.  Anticipate reassessment after workup to determine disposition.  Workup continues to return.  He has acute kidney injury compared to prior with recent creatinine of 1.07 out of 2.66.  He still feels lightheaded and fatigue but his legs are extremely edematous.  Still waiting on BMT could be completed but I am concerned about AKI in the setting of overall fluid overload.  With his heart failure history and multiple falls and near syncopal episodes with hypotension do not feel he is safe for discharge home.  He will need admission for close fluid monitoring and renal monitoring.  Will call for admission.      Final diagnoses:  AKI (acute kidney injury) (HCC)  Falls  Hypotension, unspecified hypotension type     Clinical Impression: 1. AKI (acute kidney injury) (HCC)   2. Falls   3. Hypotension, unspecified hypotension type     Disposition: Admit  This note was prepared with assistance of Dragon voice recognition software. Occasional wrong-word or sound-a-like substitutions may have occurred due to the inherent limitations of voice recognition software.      Rishaan Gunner, Lonni PARAS, MD 05/31/24 [id number]

## 2024-05-31 NOTE — H&P (Signed)
 History and Physical   Raymond Gordon FMW:[id number] DOB: [birth year] DOA: 05/31/2024  PCP: [name], Richard W, MD   Patient coming from: Home  Chief Complaint: Near syncope, falls  HPI: Raymond Gordon is a 73 y.o. male with medical history significant of hypertension, hyperlipidemia, diabetes, paroxysmal atrial fibrillation, chronic diastolic CHF, thrombophilia, OSA, obesity, QT prolongation, gout, urinary retention with Foley presenting with near syncope.  Patient reports multiple falls in the past several days with associated lightheadedness.  Denies ever losing consciousness nor hitting his head nor significant injury from the falls.  Is on Eliquis .  Does report some intermittent dizziness but primarily lightheadedness which precedes his falls.  EMS was called today and blood pressure was in the 80s systolic initially.  Improved to the 100 systolic in the ED.  Patient was admitted for heart failure exacerbation back in April of this year.  Discharge weight was 127 kg at that time.  Current weight is 110 kg.  Reports chronic lower extremity edema.  Previous notes indicate possible lymphedema.  Denies fevers, chills, chest pain, shortness of breath, abdominal pain, constipation, diarrhea, nausea, vomiting.  ED Course: Vital signs in the ED notable for blood pressure in the 100s-130 systolic, heart rate in the 50s-70s.  Lab workup included CMP with sodium 133, potassium 5.3, bicarb 20, BUN 127, creatinine elevated 2.66 up from baseline of 1.2, glucose 147, protein 8.3, albumin 3.3.  Magnesium  normal.  TSH mildly elevated at 4.86, troponin negative x 2.  BMP pending.  Lactic acid normal with repeat pending.  Urinalysis pending.  Chest x-ray showed no acute abnormality.  CT head showed no acute normality.  No initial interventions in the ED.  Review of Systems: As per HPI otherwise all other systems reviewed and are negative.  Past Medical History:  Diagnosis Date   Atrial fibrillation with RVR  (HCC) 07/08/2015   Bilateral lower extremity edema    Chronic diastolic CHF (congestive heart failure) (HCC) 08/15/2015   a. 06/2015: echo showing EF of 65-70% with Grade 2 DD   Diabetes (HCC)    INSULIN  DEPENDENT   Dysrhythmia    Hyperglycemia due to type 2 diabetes mellitus (HCC) 04/11/2021   Hyperlipidemia    Hypertension    Hypoglycemia 05/20/2014   Obesity    Obesity    Paroxysmal atrial fibrillation (HCC)    Sleep apnea     Past Surgical History:  Procedure Laterality Date   CARDIAC CATHETERIZATION     CARDIAC CATHETERIZATION N/A 2012   no large vessel occlusions   IR RADIOLOGIST EVAL & MGMT  05/03/2024    Social History  reports that he quit smoking about 26 years ago. His smoking use included cigarettes. He quit smokeless tobacco use about 8 years ago. He reports that he does not drink alcohol  and does not use drugs.  No Known Allergies  Family History  Problem Relation Age of Onset   Heart failure Brother    Hyperlipidemia Brother    Hypertension Brother    Stroke Brother    Hypertension Mother    Hyperlipidemia Mother    Diabetes Mother    Cancer Father        prostate   GER disease Father    Stroke Father    Hypertension Sister    Hyperlipidemia Sister    Hypertension Maternal Grandmother    Stroke Maternal Grandmother    Diabetes Maternal Grandfather    Hypertension Maternal Grandfather   Reviewed on admission  Prior to Admission medications  Medication Sig Start Date End Date Taking? Authorizing Provider  cefpodoxime (VANTIN) 200 MG tablet Take 100 mg by mouth 2 (two) times daily. 05/26/24  Yes [provider]  HUMALOG KWIKPEN 100 UNIT/ML KwikPen Inject into the skin as directed. 04/21/24  Yes [provider]  LANTUS  SOLOSTAR 100 UNIT/ML Solostar Pen Inject 15 Units into the skin at bedtime. 05/13/24  Yes [provider]  tamsulosin  (FLOMAX ) 0.4 MG CAPS capsule Take 0.4 mg by mouth. 04/01/24  Yes [provider]   allopurinol  (ZYLOPRIM ) 300 MG tablet Take 300 mg by mouth in the morning. 05/02/15   [provider]  apixaban  (ELIQUIS ) 5 MG TABS tablet Take 1 tablet by mouth twice daily Patient taking differently: Take 5 mg by mouth 2 (two) times daily. 09/19/23   Court Dorn PARAS, MD  aspirin  EC 81 MG tablet Take 81 mg by mouth in the morning.    [provider]  atorvastatin  (LIPITOR ) 80 MG tablet Take 1 tablet by mouth once daily Patient taking differently: Take 80 mg by mouth in the morning. 06/18/23   Court Dorn PARAS, MD  doxazosin  (CARDURA ) 4 MG tablet Take 4 mg by mouth in the morning. 11/04/13   [provider]  finasteride  (PROSCAR ) 5 MG tablet Take 5 mg by mouth every evening. 04/11/22   [provider]  furosemide  (LASIX ) 80 MG tablet Take 1 tablet (80 mg total) by mouth 2 (two) times daily. 03/12/24   Arrien, Mauricio Daniel, MD  insulin  aspart (NOVOLOG ) 100 UNIT/ML injection Inject 0-20 Units into the skin 3 (three) times daily with meals. For glucose 121 to 150 use 3 units, for 151 to 200 use 4 units, for 201 to 250 use 7 units, for 251 to 300 use 11 units, for 301 to 350 use 15 units, for 351 to 400 use 20 units. 03/12/24   Arrien, Mauricio Daniel, MD  isosorbide  mononitrate (IMDUR ) 30 MG 24 hr tablet Take 1 tablet (30 mg total) by mouth daily. Patient taking differently: Take 30 mg by mouth in the morning. 08/22/15   Juvenal Harlene PENNER, DO  losartan  (COZAAR ) 100 MG tablet Take 100 mg by mouth in the morning. 10/31/18   [provider]  metoprolol  tartrate (LOPRESSOR ) 25 MG tablet Take 1 tablet (25 mg total) by mouth 2 (two) times daily. 03/12/24 04/11/24  Arrien, Mauricio Daniel, MD  Multiple Vitamin (MULTIVITAMIN WITH MINERALS) TABS tablet Take 1 tablet by mouth in the morning.    [provider]  omeprazole  (PRILOSEC ) 20 MG capsule Take 20 mg by mouth in the morning. 10/05/18   [provider]  PRESCRIPTION MEDICATION Inhale into the lungs  at bedtime. Pt is on C Pap machine    [provider]  spironolactone  (ALDACTONE ) 25 MG tablet Take 25 mg by mouth in the morning. 04/26/22   [provider]    Physical Exam: Vitals:   05/31/24 1300 05/31/24 1315 05/31/24 1330 05/31/24 1500  BP: (!) 111/43 (!) 120/46 (!) 107/48 (!) 105/41  Pulse: (!) 56 (!) 56 (!) 57 (!) 57  Resp: 14 15 (!) 21 15  Temp:      TempSrc:      SpO2: 99% 100% 100% 100%  Weight:      Height:        Physical Exam Constitutional:      General: He is not in acute distress.    Appearance: Normal appearance.  HENT:     Head: Normocephalic and atraumatic.  Mouth/Throat:     Mouth: Mucous membranes are moist.     Pharynx: Oropharynx is clear.  Eyes:     Extraocular Movements: Extraocular movements intact.     Pupils: Pupils are equal, round, and reactive to light.  Cardiovascular:     Rate and Rhythm: Normal rate and regular rhythm.     Pulses: Normal pulses.     Heart sounds: Normal heart sounds.  Pulmonary:     Effort: Pulmonary effort is normal. No respiratory distress.     Breath sounds: Normal breath sounds.  Abdominal:     General: Bowel sounds are normal. There is no distension.     Palpations: Abdomen is soft.     Tenderness: There is no abdominal tenderness.  Musculoskeletal:        General: No swelling or deformity.     Right lower leg: Edema present.     Left lower leg: Edema present.  Skin:    General: Skin is warm and dry.  Neurological:     General: No focal deficit present.     Mental Status: Mental status is at baseline.    Labs on Admission: I have personally reviewed following labs and imaging studies  CBC: Recent Labs  Lab 05/31/24 0944  WBC 6.1  NEUTROABS 4.2  HGB 12.1*  HCT 37.5*  MCV 86.4  PLT 319    Basic Metabolic Panel: Recent Labs  Lab 05/31/24 0944  NA 133*  K 5.3*  CL 98  CO2 20*  GLUCOSE 147*  BUN 127*  CREATININE 2.66*  CALCIUM  9.8  MG 2.3    GFR: Estimated Creatinine  Clearance: 27.4 mL/min (A) (by C-G formula based on SCr of 2.66 mg/dL (H)).  Liver Function Tests: Recent Labs  Lab 05/31/24 0944  AST 29  ALT 25  ALKPHOS 87  BILITOT 0.7  PROT 8.3*  ALBUMIN 3.3*    Urine analysis:    Component Value Date/Time   COLORURINE YELLOW 08/18/2015 0610   APPEARANCEUR CLOUDY (A) 08/18/2015 0610   LABSPEC 1.013 08/18/2015 0610   PHURINE 5.0 08/18/2015 0610   GLUCOSEU NEGATIVE 08/18/2015 0610   HGBUR MODERATE (A) 08/18/2015 0610   BILIRUBINUR NEGATIVE 08/18/2015 0610   KETONESUR NEGATIVE 08/18/2015 0610   PROTEINUR NEGATIVE 08/18/2015 0610   UROBILINOGEN 1.0 08/18/2015 0610   NITRITE POSITIVE (A) 08/18/2015 0610   LEUKOCYTESUR MODERATE (A) 08/18/2015 0610    Radiological Exams on Admission: CT Head Wo Contrast Result Date: 05/31/2024 CLINICAL DATA:  Head trauma.  Multiple falls. EXAM: CT HEAD WITHOUT CONTRAST TECHNIQUE: Contiguous axial images were obtained from the base of the skull through the vertex without intravenous contrast. RADIATION DOSE REDUCTION: This exam was performed according to the departmental dose-optimization program which includes automated exposure control, adjustment of the mA and/or kV according to patient size and/or use of iterative reconstruction technique. COMPARISON:  None Available. FINDINGS: Brain: No acute intracranial hemorrhage. No focal mass lesion. No CT evidence of acute infarction. No midline shift or mass effect. No hydrocephalus. Basilar cisterns are patent. There are periventricular and subcortical white matter hypodensities. Generalized cortical atrophy. Vascular: No hyperdense vessel or unexpected calcification. Skull: Normal. Negative for fracture or focal lesion. Sinuses/Orbits: Paranasal sinuses and mastoid air cells are clear. Orbits are clear. Other: None. IMPRESSION: 1. No acute intracranial findings. 2. Atrophy and white matter microvascular disease. Electronically Signed   By: Jackquline Boxer M.D.   On:  05/31/2024 10:35   DG Chest Portable 1 View Result Date: 05/31/2024 CLINICAL DATA:  hypotension and near syncope, falls, chf. EXAM: PORTABLE CHEST 1 VIEW COMPARISON:  03/08/2024. FINDINGS: Low lung volume. Bilateral lung fields are clear. Bilateral costophrenic angles are clear. Stable cardio-mediastinal silhouette. No acute osseous abnormalities. The soft tissues are within normal limits. IMPRESSION: No active disease. Electronically Signed   By: Ree Molt M.D.   On: 05/31/2024 09:46   EKG: Independently reviewed. Sinus rhythm at 65 bpm.  Nonspecific T wave changes.  Minimal baseline artifact.  QTc okay at 453.  Assessment/Plan Active Problems:   Essential hypertension   Severe OSA (obstructive sleep apnea)   Type 2 diabetes mellitus with hyperlipidemia (HCC)   Obesity, class 3   Paroxysmal A-fib (HCC)   Prolonged Q-T interval on ECG   Gout   Pure hypercholesterolemia   AKI Hyperkalemia Uremia > Creatinine elevated to 2.66 from baseline 1.2 in the ED.  Potassium 5.3, BUN 127. > Patient is presenting with recurrent near syncope as below but also has significant lower extremity edema on exam and history of CHF. > BNP is still pending, however patient is 7 kg down from his most recent discharge weight after CHF exacerbation in April.  And has suspected history of lymphedema. > Suspect etiology of AKI is a degree of overdiuresis. > Patient has history of urinary retention and Foley catheter so we will check renal ultrasound as well. - Monitor on telemetry overnight - Gentle IV fluids - Trend renal function and electrolytes - Renal ultrasound - Hold Lasix , spironolactone , losartan , metoprolol , doxazosin   Near-syncope Falls > Patient is presenting with lightheadedness and recurrent falls secondary to this.  No true loss of consciousness.  No injury. > CT head without acute abnormality. > Suspected near syncope and falls at all secondary to dehydration/overdiuresis and AKI as  above. > Had recent echocardiogram in April as well. - Will monitor response to IV fluids - Supportive care  Chronic diastolic CHF > Last echo during admission in April with EF 35-60%, normal diastolic function, normal RV function. > Diuresed 3-4 L during that admission.  Discharge weight was 127 kg.  Weight today is 110 kg.  Suspect patient is volume down leading to AKI as above. - Holding diuretics and antihypertensives as above - Can continue with Imdur  - Daily weights, I's and O's  Hypertension - Holding Lasix , spironolactone , losartan , metoprolol , doxazosin  as above  Diabetes > 10 units nightly and SSI at home. - 5 units nightly, SSI  Paroxysmal atrial fibrillation - Holding home metoprolol  as above - Continue home Eliquis   OSA - Continue home CPAP  Obesity - Noted  History of QT prolongation - QT currently okay  Gout - Holding allopurinol  in the setting of AKI  Urinary retention - Continue home finasteride  and tamsulosin  - Foley in place  DVT prophylaxis: Eliquis  Code Status:   Full Family Communication:  Updated at bedside  Disposition Plan:   Patient is from:  Home  Anticipated DC to:  Home  Anticipated DC date:  1 to 3 days  Anticipated DC barriers: None  Consults called:  None Admission status:  Observation, progressive  Severity of Illness: The appropriate patient status for this patient is OBSERVATION. Observation status is judged to be reasonable and necessary in order to provide the required intensity of service to ensure the patient's safety. The patient's presenting symptoms, physical exam findings, and initial radiographic and laboratory data in the context of their medical condition is felt to place them at decreased risk for further clinical deterioration. Furthermore, it is anticipated that  the patient will be medically stable for discharge from the hospital within 2 midnights of admission.    Marsa KATHEE Scurry MD [facility]  How  to contact the [facility] Attending or Consulting provider 7A - 7P or covering provider during after hours 7P -7A, for this patient?   Check the care team in [facility] and look for a) attending/consulting [facility] provider listed and b) the [facility] team listed Log into [email or web address] and use [facility]'s universal password to access. If you do not have the password, please contact the hospital operator. Locate the [facility] provider you are looking for under [facility] and page to a number that you can be directly reached. If you still have difficulty reaching the provider, please page the [facility] (Director on Call) for the Hospitalists listed on amion for assistance.  05/31/2024, 4:18 PM

## 2024-05-31 NOTE — ED Notes (Signed)
 Pt up to recliner, pt catheter secured with new cath secure. Linen changed on bed

## 2024-05-31 NOTE — ED Notes (Signed)
 Pt back in bed.

## 2024-06-01 DIAGNOSIS — R338 Other retention of urine: Secondary | ICD-10-CM | POA: Diagnosis not present

## 2024-06-01 DIAGNOSIS — N401 Enlarged prostate with lower urinary tract symptoms: Secondary | ICD-10-CM | POA: Diagnosis not present

## 2024-06-01 DIAGNOSIS — Z1619 Resistance to other specified beta lactam antibiotics: Secondary | ICD-10-CM | POA: Diagnosis not present

## 2024-06-01 DIAGNOSIS — I959 Hypotension, unspecified: Secondary | ICD-10-CM

## 2024-06-01 DIAGNOSIS — R296 Repeated falls: Secondary | ICD-10-CM

## 2024-06-01 DIAGNOSIS — E860 Dehydration: Secondary | ICD-10-CM | POA: Diagnosis not present

## 2024-06-01 DIAGNOSIS — E1169 Type 2 diabetes mellitus with other specified complication: Secondary | ICD-10-CM | POA: Diagnosis not present

## 2024-06-01 DIAGNOSIS — I951 Orthostatic hypotension: Secondary | ICD-10-CM | POA: Diagnosis not present

## 2024-06-01 DIAGNOSIS — Z794 Long term (current) use of insulin: Secondary | ICD-10-CM | POA: Diagnosis not present

## 2024-06-01 DIAGNOSIS — E1165 Type 2 diabetes mellitus with hyperglycemia: Secondary | ICD-10-CM | POA: Diagnosis not present

## 2024-06-01 DIAGNOSIS — I5032 Chronic diastolic (congestive) heart failure: Secondary | ICD-10-CM | POA: Diagnosis not present

## 2024-06-01 DIAGNOSIS — E785 Hyperlipidemia, unspecified: Secondary | ICD-10-CM | POA: Diagnosis not present

## 2024-06-01 DIAGNOSIS — R0989 Other specified symptoms and signs involving the circulatory and respiratory systems: Secondary | ICD-10-CM | POA: Diagnosis not present

## 2024-06-01 DIAGNOSIS — E875 Hyperkalemia: Secondary | ICD-10-CM | POA: Diagnosis not present

## 2024-06-01 DIAGNOSIS — M109 Gout, unspecified: Secondary | ICD-10-CM | POA: Diagnosis not present

## 2024-06-01 DIAGNOSIS — I480 Paroxysmal atrial fibrillation: Secondary | ICD-10-CM | POA: Diagnosis not present

## 2024-06-01 DIAGNOSIS — E7800 Pure hypercholesterolemia, unspecified: Secondary | ICD-10-CM | POA: Diagnosis not present

## 2024-06-01 DIAGNOSIS — R9082 White matter disease, unspecified: Secondary | ICD-10-CM | POA: Diagnosis not present

## 2024-06-01 DIAGNOSIS — N189 Chronic kidney disease, unspecified: Secondary | ICD-10-CM | POA: Diagnosis not present

## 2024-06-01 DIAGNOSIS — N179 Acute kidney failure, unspecified: Secondary | ICD-10-CM | POA: Diagnosis not present

## 2024-06-01 DIAGNOSIS — N390 Urinary tract infection, site not specified: Secondary | ICD-10-CM | POA: Diagnosis not present

## 2024-06-01 DIAGNOSIS — S0990XA Unspecified injury of head, initial encounter: Secondary | ICD-10-CM | POA: Diagnosis not present

## 2024-06-01 DIAGNOSIS — I4891 Unspecified atrial fibrillation: Secondary | ICD-10-CM | POA: Diagnosis not present

## 2024-06-01 DIAGNOSIS — E872 Acidosis, unspecified: Secondary | ICD-10-CM | POA: Diagnosis not present

## 2024-06-01 DIAGNOSIS — I110 Hypertensive heart disease with heart failure: Secondary | ICD-10-CM | POA: Diagnosis not present

## 2024-06-01 DIAGNOSIS — Z7901 Long term (current) use of anticoagulants: Secondary | ICD-10-CM | POA: Diagnosis not present

## 2024-06-01 DIAGNOSIS — D6859 Other primary thrombophilia: Secondary | ICD-10-CM | POA: Diagnosis not present

## 2024-06-01 DIAGNOSIS — F0390 Unspecified dementia without behavioral disturbance: Secondary | ICD-10-CM | POA: Diagnosis not present

## 2024-06-01 DIAGNOSIS — B9689 Other specified bacterial agents as the cause of diseases classified elsewhere: Secondary | ICD-10-CM | POA: Diagnosis not present

## 2024-06-01 DIAGNOSIS — D631 Anemia in chronic kidney disease: Secondary | ICD-10-CM | POA: Diagnosis not present

## 2024-06-01 DIAGNOSIS — Z466 Encounter for fitting and adjustment of urinary device: Secondary | ICD-10-CM | POA: Diagnosis not present

## 2024-06-01 DIAGNOSIS — G4733 Obstructive sleep apnea (adult) (pediatric): Secondary | ICD-10-CM | POA: Diagnosis not present

## 2024-06-01 DIAGNOSIS — Z9181 History of falling: Secondary | ICD-10-CM | POA: Diagnosis not present

## 2024-06-01 DIAGNOSIS — E66813 Obesity, class 3: Secondary | ICD-10-CM | POA: Diagnosis not present

## 2024-06-01 DIAGNOSIS — I509 Heart failure, unspecified: Secondary | ICD-10-CM | POA: Diagnosis not present

## 2024-06-01 DIAGNOSIS — Z6841 Body Mass Index (BMI) 40.0 and over, adult: Secondary | ICD-10-CM | POA: Diagnosis not present

## 2024-06-01 DIAGNOSIS — G4730 Sleep apnea, unspecified: Secondary | ICD-10-CM | POA: Diagnosis not present

## 2024-06-01 DIAGNOSIS — I130 Hypertensive heart and chronic kidney disease with heart failure and stage 1 through stage 4 chronic kidney disease, or unspecified chronic kidney disease: Secondary | ICD-10-CM | POA: Diagnosis not present

## 2024-06-01 DIAGNOSIS — I10 Essential (primary) hypertension: Secondary | ICD-10-CM | POA: Diagnosis not present

## 2024-06-01 DIAGNOSIS — R55 Syncope and collapse: Secondary | ICD-10-CM | POA: Diagnosis not present

## 2024-06-01 LAB — COMPREHENSIVE METABOLIC PANEL WITH GFR
ALT: 21 U/L (ref 0–44)
AST: 17 U/L (ref 15–41)
Albumin: 3.1 g/dL — ABNORMAL LOW (ref 3.5–5.0)
Alkaline Phosphatase: 76 U/L (ref 38–126)
Anion gap: 9 (ref 5–15)
BUN: 113 mg/dL — ABNORMAL HIGH (ref 8–23)
CO2: 21 mmol/L — ABNORMAL LOW (ref 22–32)
Calcium: 9.4 mg/dL (ref 8.9–10.3)
Chloride: 104 mmol/L (ref 98–111)
Creatinine, Ser: 1.66 mg/dL — ABNORMAL HIGH (ref 0.61–1.24)
GFR, Estimated: 43 mL/min — ABNORMAL LOW (ref >60)
Glucose, Bld: 156 mg/dL — ABNORMAL HIGH (ref 70–99)
Potassium: 4 mmol/L (ref 3.5–5.1)
Sodium: 134 mmol/L — ABNORMAL LOW (ref 135–145)
Total Bilirubin: 0.5 mg/dL (ref 0.0–1.2)
Total Protein: 7.5 g/dL (ref 6.5–8.1)

## 2024-06-01 LAB — CBC
HCT: 35.9 % — ABNORMAL LOW (ref 39.0–52.0)
Hemoglobin: 11.7 g/dL — ABNORMAL LOW (ref 13.0–17.0)
MCH: 27.9 pg (ref 26.0–34.0)
MCHC: 32.6 g/dL (ref 30.0–36.0)
MCV: 85.5 fL (ref 80.0–100.0)
Platelets: 314 K/uL (ref 150–400)
RBC: 4.2 MIL/uL — ABNORMAL LOW (ref 4.22–5.81)
RDW: 16.6 % — ABNORMAL HIGH (ref 11.5–15.5)
WBC: 5.9 K/uL (ref 4.0–10.5)
nRBC: 0 % (ref 0.0–0.2)

## 2024-06-01 LAB — GLUCOSE, CAPILLARY
Glucose-Capillary: 201 mg/dL — ABNORMAL HIGH (ref 70–99)
Glucose-Capillary: 222 mg/dL — ABNORMAL HIGH (ref 70–99)

## 2024-06-01 LAB — CBG MONITORING, ED
Glucose-Capillary: 156 mg/dL — ABNORMAL HIGH (ref 70–99)
Glucose-Capillary: 193 mg/dL — ABNORMAL HIGH (ref 70–99)

## 2024-06-01 MED ORDER — CHLORHEXIDINE GLUCONATE CLOTH 2 % EX PADS
6.0000 | MEDICATED_PAD | Freq: Every day | CUTANEOUS | Status: DC
  Administered 2024-06-01 – 2024-06-10 (×10): 6 via TOPICAL

## 2024-06-01 MED ORDER — SODIUM CHLORIDE 0.9 % IV SOLN
1.0000 g | INTRAVENOUS | Status: DC
  Administered 2024-06-01 – 2024-06-08 (×8): 1 g via INTRAVENOUS
  Filled 2024-06-01 (×8): qty 10

## 2024-06-01 MED ORDER — INSULIN GLARGINE-YFGN 100 UNIT/ML ~~LOC~~ SOLN
10.0000 [IU] | Freq: Every day | SUBCUTANEOUS | Status: DC
  Administered 2024-06-01 – 2024-06-09 (×9): 10 [IU] via SUBCUTANEOUS
  Filled 2024-06-01 (×10): qty 0.1

## 2024-06-01 NOTE — Evaluation (Signed)
 Occupational Therapy Evaluation Patient Details Name: Raymond Gordon MRN: [id number] DOB: 07-Dec-1950 Today's Date: 06/01/2024   History of Present Illness   Patient is a 73 year old male presenting with near syncope. PMH:  UTI, HTN, hyperlipidemia, DM, paroxysmal A-fib on Eliquis , chronic diastolic CHF, OSA, obesity, QT prolongation, gout, urinary retention with Foley     Clinical Impressions Pt currently at min assist for transfers and sit to stand, mod assist for LB selfcare simulated.  He demonstrates increased difficulty with advancing his LEs secondary to weakness and pain even with use of the RW.  Slight dizziness with initial sitting with BP at 133/48.  This resolved quickly and he did not report significant changes with transition to standing.  Prior to admission, he lived alone and was modified independent with use of his rollator.  His sister's came and checked on him throughout the week.  He would not have 24 hour supervision at this time post discharge.  Feel he will benefit from acute care OT to help increase balance, safety, and ADL function to a level that is safe for return home.  Anticipate need for continued inpatient follow up therapy, <3 hours/day post acute stay.        If plan is discharge home, recommend the following:   A little help with walking and/or transfers;A little help with bathing/dressing/bathroom;Assistance with cooking/housework;Assist for transportation;Help with stairs or ramp for entrance;Direct supervision/assist for financial management     Functional Status Assessment   Patient has had a recent decline in their functional status and demonstrates the ability to make significant improvements in function in a reasonable and predictable amount of time.     Equipment Recommendations   None recommended by OT      Precautions/Restrictions   Precautions Precautions: Fall Restrictions Weight Bearing Restrictions Per Provider Order: No      Mobility Bed Mobility Overal bed mobility: Needs Assistance Bed Mobility: Supine to Sit     Supine to sit: Min assist     General bed mobility comments: Assist with bringing trunk up to sitting.    Transfers Overall transfer level: Needs assistance Equipment used: Rolling walker (2 wheels) Transfers: Sit to/from Stand, Bed to chair/wheelchair/BSC Sit to Stand: Min assist     Step pivot transfers: Min assist     General transfer comment: Slow steps with decreased weightshift noted.      Balance Overall balance assessment: Needs assistance Sitting-balance support: No upper extremity supported, Feet supported Sitting balance-Leahy Scale: Fair     Standing balance support: During functional activity, Reliant on assistive device for balance, Bilateral upper extremity supported Standing balance-Leahy Scale: Poor Standing balance comment: Pt needs UE support for mobility                           ADL either performed or assessed with clinical judgement   ADL Overall ADL's : Needs assistance/impaired Eating/Feeding: Independent;Sitting   Grooming: Wash/dry hands;Set up;Sitting   Upper Body Bathing: Supervision/ safety;Sitting   Lower Body Bathing: Minimal assistance;Sit to/from stand   Upper Body Dressing : Supervision/safety;Sitting   Lower Body Dressing: Moderate assistance;Sit to/from stand Lower Body Dressing Details (indicate cue type and reason): simulated Toilet Transfer: Minimal assistance;Ambulation;Rolling walker (2 wheels)   Toileting- Clothing Manipulation and Hygiene: Minimal assistance;Sit to/from stand       Functional mobility during ADLs: Minimal assistance;Rolling walker (2 wheels) General ADL Comments: Pt's sister present and helped provide PLOF information.  She reports that  her and her sister's come by and check on him but now that he is having trouble moving and with various tasks, they cannot provide 24 hour supervision.  Pt with  increased posterior lean in initial standing with increased difficulty advancing either LE efficiently with use of the RW.  Vitals stable throughout     Vision Baseline Vision/History: 0 No visual deficits Ability to See in Adequate Light: 0 Adequate Patient Visual Report: No change from baseline Vision Assessment?: No apparent visual deficits Additional Comments: Vision not formally assessed     Perception Perception: Within Functional Limits       Praxis Praxis: WFL       Pertinent Vitals/Pain Pain Assessment Pain Assessment: Faces Faces Pain Scale: Hurts a little bit Pain Location: right leg Pain Descriptors / Indicators: Discomfort Pain Intervention(s): Repositioned, Monitored during session     Extremity/Trunk Assessment Upper Extremity Assessment Upper Extremity Assessment: Generalized weakness (overall strength 3+/5)   Lower Extremity Assessment Lower Extremity Assessment: Defer to PT evaluation   Cervical / Trunk Assessment Cervical / Trunk Assessment: Normal   Communication Communication Communication: No apparent difficulties   Cognition Arousal: Alert Behavior During Therapy: WFL for tasks assessed/performed Cognition: No apparent impairments                               Following commands: Intact       Cueing  General Comments   Cueing Techniques: Verbal cues              Home Living Family/patient expects to be discharged to:: Private residence Living Arrangements: Alone Available Help at Discharge: Family;Friend(s);Available PRN/intermittently (sisters check on him 4-5 times a week.  He does not have anyone staying with him) Type of Home: House Home Access: Stairs to enter [employer] of Steps: 3   Home Layout: One level     Bathroom Shower/Tub: Walk-in shower;Tub/shower unit;Other (comment) (sponge bathes most of the time)   Bathroom Toilet: Handicapped height     Home Equipment: Rollator (4  wheels);Shower seat;BSC/3in1;Cane - single point;Wheelchair - manual;Grab bars - toilet;Grab bars - tub/shower   Additional Comments: Pt's sister was present and assisted with providing information.      Prior Functioning/Environment               Mobility Comments: Uses rollator inside the home, and cane when outside the home. ADLs Comments: Mod I for selfcare, fixing his meals.  Neighbor across the street helps with cleaning.    OT Problem List: Decreased strength;Decreased knowledge of use of DME or AE;Decreased activity tolerance;Impaired balance (sitting and/or standing);Pain   OT Treatment/Interventions: Self-care/ADL training;Patient/family education;Therapeutic exercise;Neuromuscular education;Balance training;Therapeutic activities;DME and/or AE instruction      OT Goals(Current goals can be found in the care plan section)   Acute Rehab OT Goals Patient Stated Goal: Pt did not state but agreeable to OT session. OT Goal Formulation: With patient Time For Goal Achievement: 06/15/24 Potential to Achieve Goals: Good   OT Frequency:  Min 1X/week       AM-PAC OT 6 Clicks Daily Activity     Outcome Measure Help from another person eating meals?: None Help from another person taking care of personal grooming?: A Little Help from another person toileting, which includes using toliet, bedpan, or urinal?: A Little Help from another person bathing (including washing, rinsing, drying)?: A Little Help from another person to put on and taking off regular upper body  clothing?: A Little Help from another person to put on and taking off regular lower body clothing?: A Lot 6 Click Score: 18   End of Session Equipment Utilized During Treatment: Gait belt;Rolling walker (2 wheels) Nurse Communication: Mobility status  Activity Tolerance: Patient tolerated treatment well Patient left: in chair;with call bell/phone within reach;with family/visitor present  OT Visit Diagnosis:  Unsteadiness on feet (R26.81);Other abnormalities of gait and mobility (R26.89);Muscle weakness (generalized) (M62.81);Pain Pain - Right/Left: Right Pain - part of body: Ankle and joints of foot                Time: 8872-8840 OT Time Calculation (min): 32 min Charges:  OT General Charges $OT Visit: 1 Visit OT Evaluation $OT Eval Moderate Complexity: 1 Mod OT Treatments $Self Care/Home Management : 8-22 mins  Lynwood Constant, OTR/L Acute Rehabilitation Services  Office [phone] 06/01/2024

## 2024-06-01 NOTE — Progress Notes (Addendum)
 [facility]                                                                              Raymond Gordon, is a 73 y.o. male, DOB - Jan 22, 1951, FMW:[id number] Admit date - 05/31/2024    Outpatient Primary MD for the patient is [name], [name], MD  LOS - 0  days  Chief Complaint  Patient presents with   Fall       Brief summary   Patient is a 73 year old male with HTN, hyperlipidemia, DM, paroxysmal A-fib on Eliquis , chronic diastolic CHF, OSA, obesity, QT prolongation, gout, urinary retention with Foley presenting with near syncope.  Patient reported multiple falls in the past several days with lightheadedness, no syncope or hitting his head.  Reported intermittent dizziness preceding his fall.  EMS was called and systolic BP was noted to be in 80s. Patient was admitted 03/08/2024-03/12/2024 for heart failure exacerbation. Discharge weight was 127 kg at that time.  Current weight is 110 kg.  Reports chronic lower extremity edema.  Previous notes indicate possible lymphedema.  In ED, lowest BP 73/48, improved 211/43, temp 97.6 F, RR 21, pulse 62 O2 sats 94% on room air creatinine 2.66, baseline 1.2, chest x-ray showed no acute abnormality.  Assessment & Plan    Principal Problem:   AKI (acute kidney injury) (HCC) with hyperkalemia -Presented with creatinine of 2.66, potassium 5.3, metabolic acidosis - Lasix , Aldactone , losartan , metoprolol , doxazosin  held, placed on gentle hydration - Creatinine improving, BP still soft 105/84 - K improved  Active Problems: Near syncope, falls - Likely due to acute kidney injury, overdiuresis, - Had a recent 2D echo 02/2024 which had shown EF of 55- 60%, left ventricular diastolic parameters, right ventricular systolic function normal - PT evaluation    Essential hypertension  -Continue to hold Lasix , Aldactone , losartan , metoprolol , doxazosin    Chronic diastolic CHF - 2D echo 02/2024 had shown EF of 55- 60%, left ventricular  diastolic parameters, right ventricular systolic function normal - Patient was discharged on Lasix  80 mg twice daily, Imdur , losartan , doxazosin , metoprolol , currently on hold -  Discharge weight was 127 kg, weight on admission 110 kg.  Suspect patient is volume down leading to AKI as above. -Currently hypovolemic, continue gentle hydration   Hypertension - Holding Lasix , spironolactone , losartan , metoprolol , doxazosin  as above  UTI -UA positive for UTI, follow urine culture and sensitivities, -Placed on IV Rocephin    Diabetes mellitus type 2, IDDM hyperglycemia Hemoglobin A1c 7.9 on 03/08/2024 CBG (last 3)  Recent Labs    05/31/24 1658 05/31/24 2325 06/01/24 0754  GLUCAP 144* 160* 156*   Continue sliding scale insulin , increase Lantus  to 10 units at bedtime  (outpatient on 15 units nightly)   Paroxysmal atrial fibrillation - Holding home metoprolol  as above - Continue home Eliquis    OSA - Continue home CPAP   History of QT prolongation - Avoid QT prolonging meds, correct electrolyte abnormalities   Gout - Holding allopurinol  in the setting of AKI   Urinary retention - Continue home finasteride  and tamsulosin  - Foley in place  PT  Obesity class II Estimated body mass index is 41.71 kg/m  as calculated from the following:   Height as of this encounter: 5' 4 (1.626 m).   Weight as of this encounter: 110.2 kg.  Code Status: Full CODE STATUS DVT Prophylaxis:   apixaban  (ELIQUIS ) tablet 5 mg   Level of Care: Level of care: Progressive Family Communication: Updated patient Disposition Plan:      Remains inpatient appropriate: PT OT evaluation, if continues to improve and renal function stable, possible DC to SNF in a.m.   Procedures:    Consultants:     Antimicrobials:   Anti-infectives (From admission, onward)    None          Medications  apixaban   5 mg Oral BID   finasteride   5 mg Oral QPM   insulin  aspart  0-9 Units Subcutaneous TID WC    insulin  glargine-yfgn  5 Units Subcutaneous QHS   isosorbide  mononitrate  30 mg Oral Daily   pantoprazole   40 mg Oral Daily   sodium chloride  flush  3 mL Intravenous Q12H   tamsulosin   0.4 mg Oral Daily      Subjective:   Raymond Gordon was seen and examined today.  Feeling better today.  No acute chest pain,  shortness of breath, abdominal pain, N/V/D. + Generalized weakness. No acute events overnight.    Objective:   Vitals:   06/01/24 0500 06/01/24 0600 06/01/24 0800 06/01/24 0900  BP: 122/62 121/63 (!) 152/54 105/84  Pulse: 60 (!) 58 71 64  Resp: 13 13 (!) 23 15  Temp:      TempSrc:      SpO2: 97% 100% 100% 100%  Weight:      Height:        Intake/Output Summary (Last 24 hours) at 06/01/2024 0934 Last data filed at 06/01/2024 0227 Gross per 24 hour  Intake 715.4 ml  Output 500 ml  Net 215.4 ml     Wt Readings from Last 3 Encounters:  05/31/24 110.2 kg  03/31/24 120.3 kg  03/12/24 128.5 kg     Exam General: Alert and oriented x 3, NAD Cardiovascular: S1 S2 auscultated,  RRR Respiratory: Clear to auscultation bilaterally Gastrointestinal: Soft, nontender, nondistended, + bowel sounds Ext: no pedal edema bilaterally Neuro: no new deficits Psych: Normal affect     Data Reviewed:  I have personally reviewed following labs    CBC Lab Results  Component Value Date   WBC 5.9 06/01/2024   RBC 4.20 (L) 06/01/2024   HGB 11.7 (L) 06/01/2024   HCT 35.9 (L) 06/01/2024   MCV 85.5 06/01/2024   MCH 27.9 06/01/2024   PLT 314 06/01/2024   MCHC 32.6 06/01/2024   RDW 16.6 (H) 06/01/2024   LYMPHSABS 1.3 05/31/2024   MONOABS 0.5 05/31/2024   EOSABS 0.1 05/31/2024   BASOSABS 0.0 05/31/2024     Last metabolic panel Lab Results  Component Value Date   NA 134 (L) 06/01/2024   K 4.0 06/01/2024   CL 104 06/01/2024   CO2 21 (L) 06/01/2024   BUN 113 (H) 06/01/2024   CREATININE 1.66 (H) 06/01/2024   GLUCOSE 156 (H) 06/01/2024   GFRNONAA 43 (L) 06/01/2024   GFRAA  >60 08/21/2015   CALCIUM  9.4 06/01/2024   PROT 7.5 06/01/2024   ALBUMIN 3.1 (L) 06/01/2024   LABGLOB 3.1 11/02/2021   AGRATIO 1.5 11/02/2021   BILITOT 0.5 06/01/2024   ALKPHOS 76 06/01/2024   AST 17 06/01/2024   ALT 21 06/01/2024   ANIONGAP 9 06/01/2024    CBG (last 3)  Recent Labs    05/31/24 1658 05/31/24 2325 06/01/24 0754  GLUCAP 144* 160* 156*      Coagulation Profile: No results for input(s): INR, PROTIME in the last 168 hours.   Radiology Studies: I have personally reviewed the imaging studies  US  RENAL Result Date: 05/31/2024 CLINICAL DATA:  [id number] AKI (acute kidney injury) (HCC) [id number] EXAM: RENAL / URINARY TRACT ULTRASOUND COMPLETE COMPARISON:  March 10, 2024 FINDINGS: Right Kidney: Renal measurements: 11.2 x 4.8 x 4.4 cm = volume: 125 mL. Normal echogenicity. No mass. No hydronephrosis or nephrolithiasis. Left Kidney: Renal measurements: 10 x 5.4 x 4.4 cm = volume: 124 mL. Normal echogenicity. No mass. No hydronephrosis or nephrolithiasis. Bladder: Completely decompressed with a urinary catheter in place. Severe prostatomegaly. Other: None. IMPRESSION: 1. No hydronephrosis or nephrolithiasis. 2. The urinary bladder is completely decompressed with a urinary catheter in place. 3. Severe prostatomegaly. Electronically Signed   By: Rogelia Myers M.D.   On: 05/31/2024 17:43   CT Head Wo Contrast Result Date: 05/31/2024 CLINICAL DATA:  Head trauma.  Multiple falls. EXAM: CT HEAD WITHOUT CONTRAST TECHNIQUE: Contiguous axial images were obtained from the base of the skull through the vertex without intravenous contrast. RADIATION DOSE REDUCTION: This exam was performed according to the departmental dose-optimization program which includes automated exposure control, adjustment of the mA and/or kV according to patient size and/or use of iterative reconstruction technique. COMPARISON:  None Available. FINDINGS: Brain: No acute intracranial hemorrhage. No focal mass lesion. No CT  evidence of acute infarction. No midline shift or mass effect. No hydrocephalus. Basilar cisterns are patent. There are periventricular and subcortical white matter hypodensities. Generalized cortical atrophy. Vascular: No hyperdense vessel or unexpected calcification. Skull: Normal. Negative for fracture or focal lesion. Sinuses/Orbits: Paranasal sinuses and mastoid air cells are clear. Orbits are clear. Other: None. IMPRESSION: 1. No acute intracranial findings. 2. Atrophy and white matter microvascular disease. Electronically Signed   By: Jackquline Boxer M.D.   On: 05/31/2024 10:35   DG Chest Portable 1 View Result Date: 05/31/2024 CLINICAL DATA:  hypotension and near syncope, falls, chf. EXAM: PORTABLE CHEST 1 VIEW COMPARISON:  03/08/2024. FINDINGS: Low lung volume. Bilateral lung fields are clear. Bilateral costophrenic angles are clear. Stable cardio-mediastinal silhouette. No acute osseous abnormalities. The soft tissues are within normal limits. IMPRESSION: No active disease. Electronically Signed   By: Ree Molt M.D.   On: 05/31/2024 09:46       Joslyne Marshburn M.D. [facility] 06/01/2024, 9:34 AM  Available via Epic secure chat 7am-7pm After 7 pm, please refer to night coverage provider listed on amion.

## 2024-06-01 NOTE — Progress Notes (Signed)
   06/01/24 2115  BiPAP/CPAP/SIPAP  $ Face Mask Large  Yes  BiPAP/CPAP/SIPAP Pt Type Adult  BiPAP/CPAP/SIPAP DREAMSTATIOND  Mask Type Full face mask  Mask Size Large  Respiratory Rate 18 breaths/min  IPAP 15 cmH20  EPAP 6 cmH2O  FiO2 (%) 21 %  Leak 31  Patient Home Machine No  Patient Home Mask No  Patient Home Tubing No  Auto Titrate Yes  Minimum cmH2O 6 cmH2O  Maximum cmH2O 15 cmH2O  Device Plugged into RED Power Outlet Yes     06/01/24 2115  BiPAP/CPAP/SIPAP  $ Face Mask Large  Yes  BiPAP/CPAP/SIPAP Pt Type Adult  BiPAP/CPAP/SIPAP DREAMSTATIOND  Mask Type Full face mask  Mask Size Large  Respiratory Rate 18 breaths/min  IPAP 15 cmH20  EPAP 6 cmH2O  FiO2 (%) 21 %  Leak 31  Patient Home Machine No  Patient Home Mask No  Patient Home Tubing No  Auto Titrate Yes  Minimum cmH2O 6 cmH2O  Maximum cmH2O 15 cmH2O  Device Plugged into RED Power Outlet Yes

## 2024-06-02 ENCOUNTER — Ambulatory Visit (HOSPITAL_COMMUNITY)
Admission: RE | Admit: 2024-06-02 | Discharge: 2024-06-02 | Disposition: A | Discharge status: Home / Self Care | Source: Ambulatory Visit | Attending: Interventional Radiology | Admitting: Interventional Radiology

## 2024-06-02 DIAGNOSIS — N179 Acute kidney failure, unspecified: Secondary | ICD-10-CM | POA: Diagnosis not present

## 2024-06-02 LAB — URINE CULTURE: Culture: 60000 — AB

## 2024-06-02 LAB — RENAL FUNCTION PANEL
Albumin: 2.9 g/dL — ABNORMAL LOW (ref 3.5–5.0)
Anion gap: 7 (ref 5–15)
BUN: 78 mg/dL — ABNORMAL HIGH (ref 8–23)
CO2: 21 mmol/L — ABNORMAL LOW (ref 22–32)
Calcium: 8.9 mg/dL (ref 8.9–10.3)
Chloride: 107 mmol/L (ref 98–111)
Creatinine, Ser: 1.12 mg/dL (ref 0.61–1.24)
GFR, Estimated: >60 mL/min (ref >60)
Glucose, Bld: 167 mg/dL — ABNORMAL HIGH (ref 70–99)
Phosphorus: 2.6 mg/dL (ref 2.5–4.6)
Potassium: 4.3 mmol/L (ref 3.5–5.1)
Sodium: 135 mmol/L (ref 135–145)

## 2024-06-02 LAB — CBC
HCT: 34.9 % — ABNORMAL LOW (ref 39.0–52.0)
Hemoglobin: 11.4 g/dL — ABNORMAL LOW (ref 13.0–17.0)
MCH: 27.9 pg (ref 26.0–34.0)
MCHC: 32.7 g/dL (ref 30.0–36.0)
MCV: 85.5 fL (ref 80.0–100.0)
Platelets: 296 K/uL (ref 150–400)
RBC: 4.08 MIL/uL — ABNORMAL LOW (ref 4.22–5.81)
RDW: 16.6 % — ABNORMAL HIGH (ref 11.5–15.5)
WBC: 5.6 K/uL (ref 4.0–10.5)
nRBC: 0 % (ref 0.0–0.2)

## 2024-06-02 LAB — GLUCOSE, CAPILLARY
Glucose-Capillary: 149 mg/dL — ABNORMAL HIGH (ref 70–99)
Glucose-Capillary: 212 mg/dL — ABNORMAL HIGH (ref 70–99)
Glucose-Capillary: 216 mg/dL — ABNORMAL HIGH (ref 70–99)
Glucose-Capillary: 177 mg/dL — ABNORMAL HIGH (ref 70–99)
Glucose-Capillary: 194 mg/dL — ABNORMAL HIGH (ref 70–99)

## 2024-06-02 MED ORDER — SODIUM CHLORIDE 0.9 % IV SOLN: INTRAVENOUS | Status: AC

## 2024-06-02 MED ORDER — METOPROLOL TARTRATE 12.5 MG HALF TABLET
12.5000 mg | ORAL_TABLET | Freq: Two times a day (BID) | ORAL | Status: DC
  Administered 2024-06-03 – 2024-06-07 (×8): 12.5 mg via ORAL
  Filled 2024-06-02 (×10): qty 1

## 2024-06-02 NOTE — Progress Notes (Signed)
 Pt placed on CPAP by RT @ 10:00PM  06/02/24 2200  BiPAP/CPAP/SIPAP  $ Non-Invasive Ventilator  Non-Invasive Vent Subsequent  $ Non-Invasive Home Ventilator  Subsequent  $ Face Mask Large  Yes  BiPAP/CPAP/SIPAP Pt Type Adult  BiPAP/CPAP/SIPAP DREAMSTATIOND  Mask Type Full face mask  Mask Size Large  Respiratory Rate 18 breaths/min  IPAP 15 cmH20  EPAP 6 cmH2O  FiO2 (%) 21 %  Leak 31  Patient Home Machine No  Patient Home Mask No  Patient Home Tubing No  Auto Titrate No  Minimum cmH2O 6 cmH2O  Maximum cmH2O 15 cmH2O  Nasal massage performed Yes  CPAP/SIPAP surface wiped down Yes  Device Plugged into RED Power Outlet Yes

## 2024-06-02 NOTE — Plan of Care (Signed)
  Problem: Coping: Goal: Ability to adjust to condition or change in health will improve Outcome: Progressing   Problem: Health Behavior/Discharge Planning: Goal: Ability to manage health-related needs will improve Outcome: Progressing   Problem: Nutritional: Goal: Maintenance of adequate nutrition will improve Outcome: Progressing   Problem: Skin Integrity: Goal: Risk for impaired skin integrity will decrease Outcome: Progressing   Problem: Education: Goal: Knowledge of General Education information will improve Description: Including pain rating scale, medication(s)/side effects and non-pharmacologic comfort measures Outcome: Progressing   Problem: Activity: Goal: Risk for activity intolerance will decrease Outcome: Progressing   Problem: Coping: Goal: Level of anxiety will decrease Outcome: Progressing   Problem: Safety: Goal: Ability to remain free from injury will improve Outcome: Progressing

## 2024-06-02 NOTE — Evaluation (Signed)
 Physical Therapy Evaluation Patient Details Name: Raymond Gordon MRN: [id number] DOB: [birth year] Today's Date: 06/02/2024  History of Present Illness  Patient is a 73 year old male presenting with near syncope. PMH:  UTI, HTN, hyperlipidemia, DM, paroxysmal A-fib on Eliquis , chronic diastolic CHF, OSA, obesity, QT prolongation, gout, urinary retention with Foley  Clinical Impression  Pt presents with admitting diagnosis above. Pt today was able to ambulate short distance in room with Min A RW with very short shuffling like gait pattern however was very limited by decreased activity tolerance and high fear of falling. PTA pt reports that he was Mod I with rollator/SPC. Given that pt lives alone, patient will benefit from continued inpatient follow up therapy, <3 hours/day. PT will continue to follow.         If plan is discharge home, recommend the following: A lot of help with walking and/or transfers;A little help with bathing/dressing/bathroom;Assistance with cooking/housework;Direct supervision/assist for medications management;Direct supervision/assist for financial management;Assist for transportation;Help with stairs or ramp for entrance;Supervision due to cognitive status   Can travel by private vehicle   No    Equipment Recommendations Rolling walker (2 wheels)  Recommendations for Other Services       Functional Status Assessment Patient has had a recent decline in their functional status and demonstrates the ability to make significant improvements in function in a reasonable and predictable amount of time.     Precautions / Restrictions Precautions Precautions: Fall Recall of Precautions/Restrictions: Intact Restrictions Weight Bearing Restrictions Per Provider Order: No      Mobility  Bed Mobility Overal bed mobility: Needs Assistance Bed Mobility: Sit to Supine       Sit to supine: Min assist   General bed mobility comments: Pt received in recliner however returned  to bed following ambulation. Min A for BLE management.    Transfers Overall transfer level: Needs assistance Equipment used: Rolling walker (2 wheels) Transfers: Sit to/from Stand Sit to Stand: Min assist           General transfer comment: Cues for hand placement. Min A to steady ad power up.    Ambulation/Gait Ambulation/Gait assistance: Min assist Gait Distance (Feet): 10 Feet Assistive device: Rolling walker (2 wheels) Gait Pattern/deviations: Decreased stride length, Step-through pattern, Shuffle Gait velocity: decreased     General Gait Details: Pt with very short almost shuffling like gait pattern. Very high fear of falling and needs lots of reassurance. no LOB noted noted. Fatigued very quickly.  Stairs            Wheelchair Mobility     Tilt Bed    Modified Rankin (Stroke Patients Only)       Balance Overall balance assessment: Needs assistance Sitting-balance support: No upper extremity supported, Feet supported Sitting balance-Leahy Scale: Fair     Standing balance support: During functional activity, Reliant on assistive device for balance, Bilateral upper extremity supported Standing balance-Leahy Scale: Poor Standing balance comment: Pt needs UE support for mobility                             Pertinent Vitals/Pain Pain Assessment Pain Assessment: 0-10 Pain Score: 5  Pain Location: BLE Pain Descriptors / Indicators: Discomfort Pain Intervention(s): Monitored during session, Repositioned    Home Living Family/patient expects to be discharged to:: Private residence Living Arrangements: Alone Available Help at Discharge: Family;Friend(s);Available PRN/intermittently Type of Home: House Home Access: Stairs to enter Entrance Stairs-Rails: Right Entrance Stairs-Number of  Steps: 3   Home Layout: One level Home Equipment: Rollator (4 wheels);Shower seat;BSC/3in1;Cane - single point;Wheelchair - manual;Grab bars - toilet;Grab  bars - tub/shower Additional Comments: Pt's sister was present and assisted with providing information.    Prior Function Prior Level of Function : Independent/Modified Independent             Mobility Comments: Uses rollator inside the home, and cane when outside the home. ADLs Comments: Mod I for selfcare, fixing his meals.  Neighbor across the street helps with cleaning.     Extremity/Trunk Assessment   Upper Extremity Assessment Upper Extremity Assessment: Overall WFL for tasks assessed    Lower Extremity Assessment Lower Extremity Assessment: Generalized weakness    Cervical / Trunk Assessment Cervical / Trunk Assessment: Normal  Communication   Communication Communication: No apparent difficulties    Cognition Arousal: Alert Behavior During Therapy: WFL for tasks assessed/performed   PT - Cognitive impairments: No apparent impairments                       PT - Cognition Comments: Fear of falling Following commands: Intact       Cueing Cueing Techniques: Verbal cues, Tactile cues     General Comments General comments (skin integrity, edema, etc.): BP: 137/49 seated. BP: 137/59 standing    Exercises     Assessment/Plan    PT Assessment Patient needs continued PT services  PT Problem List Decreased strength;Decreased range of motion;Decreased activity tolerance;Decreased balance;Decreased coordination;Decreased mobility;Decreased cognition;Decreased safety awareness;Decreased knowledge of use of DME;Cardiopulmonary status limiting activity;Decreased knowledge of precautions       PT Treatment Interventions DME instruction;Gait training;Functional mobility training;Therapeutic exercise;Stair training;Therapeutic activities;Balance training;Neuromuscular re-education;Cognitive remediation;Patient/family education    PT Goals (Current goals can be found in the Care Plan section)  Acute Rehab PT Goals Patient Stated Goal: to get better. PT Goal  Formulation: With patient Time For Goal Achievement: 06/16/24 Potential to Achieve Goals: Good    Frequency Min 2X/week     Co-evaluation               AM-PAC PT 6 Clicks Mobility  Outcome Measure Help needed turning from your back to your side while in a flat bed without using bedrails?: A Little Help needed moving from lying on your back to sitting on the side of a flat bed without using bedrails?: A Little Help needed moving to and from a bed to a chair (including a wheelchair)?: A Little Help needed standing up from a chair using your arms (e.g., wheelchair or bedside chair)?: A Little Help needed to walk in hospital room?: A Lot Help needed climbing 3-5 steps with a railing? : Total 6 Click Score: 15    End of Session Equipment Utilized During Treatment: Gait belt Activity Tolerance: Patient tolerated treatment well Patient left: in bed;with call bell/phone within reach;with family/visitor present Nurse Communication: Mobility status PT Visit Diagnosis: Other abnormalities of gait and mobility (R26.89)    Time: 8662-8640 PT Time Calculation (min) (ACUTE ONLY): 22 min   Charges:   PT Evaluation $PT Eval Moderate Complexity: 1 Mod   PT General Charges $$ ACUTE PT VISIT: 1 Visit         Sueellen NOVAK, PT, DPT Acute Rehab Services [phone]   Onedia Vargus 06/02/2024, 3:47 PM

## 2024-06-02 NOTE — Progress Notes (Signed)
 [facility]                                                                              Raymond Gordon, is a 73 y.o. male, DOB - Mar 19, 1951, FMW:[id number] Admit date - 05/31/2024    Outpatient Primary MD for the patient is [name], [name], MD  LOS - 1  days  Chief Complaint  Patient presents with   Fall       Brief summary   Patient is a 73 year old male with HTN, hyperlipidemia, DM, paroxysmal A-fib on Eliquis , chronic diastolic CHF, OSA, obesity, QT prolongation, gout, urinary retention with Foley presenting with near syncope.  Patient reported multiple falls in the past several days with lightheadedness, no syncope or hitting his head.  Reported intermittent dizziness preceding his fall.  EMS was called and systolic BP was noted to be in 80s. Patient was admitted 03/08/2024-03/12/2024 for heart failure exacerbation. Discharge weight was 127 kg at that time.  Current weight is 110 kg.  Reports chronic lower extremity edema.  Previous notes indicate possible lymphedema.  In ED, lowest BP 73/48, improved 211/43, temp 97.6 F, RR 21, pulse 62 O2 sats 94% on room air creatinine 2.66, baseline 1.2, chest x-ray showed no acute abnormality.  Assessment & Plan    AKI (acute kidney injury) (HCC) with hyperkalemia -Presented with creatinine of 2.66, potassium 5.3, metabolic acidosis - Lasix , Aldactone , losartan , metoprolol , doxazosin  held, placed on gentle hydration - AKI most likely in the setting of volume depletion/low blood pressure from her antihypertensive medications and diuretics.  - Renal function much improved with IV fluids and holding these meds. - Potassium has normalized - Still appears to be clinically dry, continue with IV fluids   Near syncope, falls - Likely due to acute kidney injury, overdiuresis, - Had a recent 2D echo 02/2024 which had shown EF of 55- 60%, left ventricular diastolic parameters, right ventricular systolic function normal - Improving  with IV fluids - PT/OT consulted    Essential hypertension  -Continue to hold Lasix , Aldactone , losartan , metoprolol , doxazosin  - He is not hypotensive no more, blood pressure started to improve, within normal limit, so we will continue to monitor today and hold on resuming any home meds on once we do will start gradually.   Chronic diastolic CHF - 2D echo 02/2024 had shown EF of 55- 60%, left ventricular diastolic parameters, right ventricular systolic function normal - Patient was discharged on Lasix  80 mg twice daily, Imdur , losartan , doxazosin , metoprolol , currently on hold -  Discharge weight was 127 kg, weight on admission 110 kg.  Suspect patient is volume down leading to AKI as above. -Currently hypovolemic, continue gentle hydration   Hypertension - Holding Lasix , spironolactone , losartan , metoprolol , doxazosin  as above  UTI -UA positive for UTI, follow urine culture and sensitivities, - Urine culture growing Serratia, sensitive to Rocephin , will continue.   Diabetes mellitus type 2, IDDM hyperglycemia Hemoglobin A1c 7.9 on 03/08/2024 CBG (last 3)  Recent Labs    06/02/24 0730 06/02/24 1130 06/02/24 1532  GLUCAP 149* 212* 216*   Continue sliding scale insulin , increase Lantus  to 10 units at bedtime  (outpatient  on 15 units nightly)   Paroxysmal atrial fibrillation - Holding home metoprolol  as above, will resume at low-dose metoprolol  to avoid RVR. - Continue home Eliquis    OSA - Continue home CPAP   History of QT prolongation - Avoid QT prolonging meds, correct electrolyte abnormalities   Gout - Holding allopurinol  in the setting of AKI   Urinary retention - Continue home finasteride  and tamsulosin  - Foley in place, inserted last month, he reports was exchanged at the end of June.   Obesity class II Estimated body mass index is 40.15 kg/m as calculated from the following:   Height as of this encounter: 5' 4 (1.626 m).   Weight as of this encounter: 106.1  kg.  Code Status: Full CODE STATUS DVT Prophylaxis:   apixaban  (ELIQUIS ) tablet 5 mg   Level of Care: Level of care: Progressive Family Communication: None at bedside Disposition Plan:      SNF versus home home health  Procedures:    Consultants:   None  Antimicrobials:   Anti-infectives (From admission, onward)    Start     Dose/Rate Route Frequency Ordered Stop   06/01/24 1000  cefTRIAXone  (ROCEPHIN ) 1 g in sodium chloride  0.9 % 100 mL IVPB        1 g 200 mL/hr over 30 Minutes Intravenous Every 24 hours 06/01/24 0947            Medications  apixaban   5 mg Oral BID   Chlorhexidine  Gluconate Cloth  6 each Topical Daily   finasteride   5 mg Oral QPM   insulin  aspart  0-9 Units Subcutaneous TID WC   insulin  glargine-yfgn  10 Units Subcutaneous QHS   isosorbide  mononitrate  30 mg Oral Daily   pantoprazole   40 mg Oral Daily   sodium chloride  flush  3 mL Intravenous Q12H   tamsulosin   0.4 mg Oral Daily      Subjective:   Raymond Gordon was seen and examined today, patient reports he is feeling better, was dizzy/lightheaded upon standing up with nurse earlier in the day, but he was not orthostatic with PT. Objective:   Vitals:   06/02/24 0528 06/02/24 0732 06/02/24 1131 06/02/24 1530  BP: (!) 143/56 (!) 151/45 (!) 133/39 (!) 131/59  Pulse: 62 63 69 70  Resp: 17 18 20 16   Temp: 97.8 F (36.6 C) (!) 97.5 F (36.4 C) 98.2 F (36.8 C) (!) 97.5 F (36.4 C)  TempSrc: Oral Oral Oral Oral  SpO2: 92% 99% 96% 100%  Weight:      Height:        Intake/Output Summary (Last 24 hours) at 06/02/2024 1552 Last data filed at 06/02/2024 1111 Gross per 24 hour  Intake --  Output 2250 ml  Net -2250 ml     Wt Readings from Last 3 Encounters:  06/02/24 106.1 kg  03/31/24 120.3 kg  03/12/24 128.5 kg     Exam Awake Alert, Oriented X 3, No new F.N deficits, Normal affect Symmetrical Chest wall movement, Good air movement bilaterally, CTAB RRR,No Gallops,Rubs or new  Murmurs, No Parasternal Heave +ve B.Sounds, Abd Soft, No tenderness, No rebound - guarding or rigidity. No Cyanosis, Clubbing or edema, he is having chronic lower extremity skin changes, have wrinkled lower extremity which indicates he has significant edema which has resolved    Data Reviewed:  I have personally reviewed following labs    CBC Lab Results  Component Value Date   WBC 5.6 06/02/2024   RBC 4.08 (L) 06/02/2024  HGB 11.4 (L) 06/02/2024   HCT 34.9 (L) 06/02/2024   MCV 85.5 06/02/2024   MCH 27.9 06/02/2024   PLT 296 06/02/2024   MCHC 32.7 06/02/2024   RDW 16.6 (H) 06/02/2024   LYMPHSABS 1.3 05/31/2024   MONOABS 0.5 05/31/2024   EOSABS 0.1 05/31/2024   BASOSABS 0.0 05/31/2024     Last metabolic panel Lab Results  Component Value Date   NA 135 06/02/2024   K 4.3 06/02/2024   CL 107 06/02/2024   CO2 21 (L) 06/02/2024   BUN 78 (H) 06/02/2024   CREATININE 1.12 06/02/2024   GLUCOSE 167 (H) 06/02/2024   GFRNONAA >60 06/02/2024   GFRAA >60 08/21/2015   CALCIUM  8.9 06/02/2024   PHOS 2.6 06/02/2024   PROT 7.5 06/01/2024   ALBUMIN 2.9 (L) 06/02/2024   LABGLOB 3.1 11/02/2021   AGRATIO 1.5 11/02/2021   BILITOT 0.5 06/01/2024   ALKPHOS 76 06/01/2024   AST 17 06/01/2024   ALT 21 06/01/2024   ANIONGAP 7 06/02/2024    CBG (last 3)  Recent Labs    06/02/24 0730 06/02/24 1130 06/02/24 1532  GLUCAP 149* 212* 216*      Coagulation Profile: No results for input(s): INR, PROTIME in the last 168 hours.   Radiology Studies: I have personally reviewed the imaging studies  US  RENAL Result Date: 05/31/2024 CLINICAL DATA:  409830 AKI (acute kidney injury) (HCC) 409830 EXAM: RENAL / URINARY TRACT ULTRASOUND COMPLETE COMPARISON:  March 10, 2024 FINDINGS: Right Kidney: Renal measurements: 11.2 x 4.8 x 4.4 cm = volume: 125 mL. Normal echogenicity. No mass. No hydronephrosis or nephrolithiasis. Left Kidney: Renal measurements: 10 x 5.4 x 4.4 cm = volume: 124 mL. Normal  echogenicity. No mass. No hydronephrosis or nephrolithiasis. Bladder: Completely decompressed with a urinary catheter in place. Severe prostatomegaly. Other: None. IMPRESSION: 1. No hydronephrosis or nephrolithiasis. 2. The urinary bladder is completely decompressed with a urinary catheter in place. 3. Severe prostatomegaly. Electronically Signed   By: Rogelia Myers M.D.   On: 05/31/2024 17:43       Brayton Lye M.D. [facility] 06/02/2024, 3:52 PM  Available via Epic secure chat 7am-7pm After 7 pm, please refer to night coverage provider listed on amion.

## 2024-06-03 DIAGNOSIS — N179 Acute kidney failure, unspecified: Secondary | ICD-10-CM | POA: Diagnosis not present

## 2024-06-03 LAB — RENAL FUNCTION PANEL
Albumin: 2.7 g/dL — ABNORMAL LOW (ref 3.5–5.0)
Anion gap: 11 (ref 5–15)
BUN: 51 mg/dL — ABNORMAL HIGH (ref 8–23)
CO2: 20 mmol/L — ABNORMAL LOW (ref 22–32)
Calcium: 9.5 mg/dL (ref 8.9–10.3)
Chloride: 108 mmol/L (ref 98–111)
Creatinine, Ser: 1.08 mg/dL (ref 0.61–1.24)
GFR, Estimated: >60 mL/min (ref >60)
Glucose, Bld: 157 mg/dL — ABNORMAL HIGH (ref 70–99)
Phosphorus: 2.2 mg/dL — ABNORMAL LOW (ref 2.5–4.6)
Potassium: 4.3 mmol/L (ref 3.5–5.1)
Sodium: 139 mmol/L (ref 135–145)

## 2024-06-03 LAB — CBC
HCT: 35.6 % — ABNORMAL LOW (ref 39.0–52.0)
Hemoglobin: 11.4 g/dL — ABNORMAL LOW (ref 13.0–17.0)
MCH: 27.7 pg (ref 26.0–34.0)
MCHC: 32 g/dL (ref 30.0–36.0)
MCV: 86.4 fL (ref 80.0–100.0)
Platelets: 279 K/uL (ref 150–400)
RBC: 4.12 MIL/uL — ABNORMAL LOW (ref 4.22–5.81)
RDW: 17.1 % — ABNORMAL HIGH (ref 11.5–15.5)
WBC: 5.4 K/uL (ref 4.0–10.5)
nRBC: 0 % (ref 0.0–0.2)

## 2024-06-03 LAB — GLUCOSE, CAPILLARY
Glucose-Capillary: 149 mg/dL — ABNORMAL HIGH (ref 70–99)
Glucose-Capillary: 199 mg/dL — ABNORMAL HIGH (ref 70–99)
Glucose-Capillary: 192 mg/dL — ABNORMAL HIGH (ref 70–99)
Glucose-Capillary: 150 mg/dL — ABNORMAL HIGH (ref 70–99)

## 2024-06-03 MED ORDER — ASPIRIN 81 MG PO TBEC
81.0000 mg | DELAYED_RELEASE_TABLET | Freq: Every day | ORAL | Status: DC
  Administered 2024-06-03 – 2024-06-04 (×2): 81 mg via ORAL
  Filled 2024-06-03 (×2): qty 1

## 2024-06-03 MED ORDER — LOSARTAN POTASSIUM 50 MG PO TABS
100.0000 mg | ORAL_TABLET | Freq: Every day | ORAL | Status: DC
  Administered 2024-06-03 – 2024-06-07 (×5): 100 mg via ORAL
  Filled 2024-06-03 (×5): qty 2

## 2024-06-03 NOTE — TOC Progression Note (Signed)
 Transition of Care [facility]) - Progression Note    Patient Details  Name: Raymond Gordon MRN: [id number] Date of Birth: 1951/01/18  [facility]) CM/SW Contact  Jonita Hirota LITTIE Moose, LCSW Phone Number: 06/03/2024, 2:42 PM  Clinical Narrative:    CSW called [employer] and initiated insurance auth pending facility choice.  Expected Discharge Plan: Skilled Nursing Facility Barriers to Discharge: English as a second language teacher, Continued Medical Work up  Expected Discharge Plan and Services In-house Referral: Clinical Social Work   Post Acute Care Choice: Skilled Nursing Facility Living arrangements for the past 2 months: Single Family Home                                       Social Determinants of Health (SDOH) Interventions SDOH Screenings   Food Insecurity: No Food Insecurity (06/01/2024)  Housing: Low Risk  (06/01/2024)  Transportation Needs: No Transportation Needs (06/03/2024)  Utilities: Not At Risk (06/03/2024)  Social Connections: Moderately Integrated (06/01/2024)  Tobacco Use: Medium Risk (05/04/2024)    Readmission Risk Interventions     No data to display

## 2024-06-03 NOTE — NC FL2 (Signed)
 [place]  MEDICAID FL2 LEVEL OF CARE FORM     IDENTIFICATION  Patient Name: Raymond Gordon Birthdate: 1951-09-07 Sex: male Admission Date (Current Location): 05/31/2024  [facility] and [place] Number:  Producer, television/film/video and Address:  [facility] [place]. [facility], [address], [place], [name] [address]      Provider Number: [id number]  Attending Physician Name and Address:  Elgergawy, Brayton RAMAN, MD  Relative Name and Phone Number:       Current Level of Care: Hospital Recommended Level of Care: Skilled Nursing Facility Prior Approval Number:    Date Approved/Denied:   PASRR Number: [id number]  Discharge Plan: SNF    Current Diagnoses: Patient Active Problem List   Diagnosis Date Noted   AKI (acute kidney injury) (HCC) 05/31/2024   Obesity, class 3 03/10/2024   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   Pain due to onychomycosis of toenails of both feet 04/12/2021   Vitreomacular adhesion of both eyes 04/03/2021   Moderate nonproliferative diabetic retinopathy of both eyes without macular edema associated with type 2 diabetes mellitus (HCC) 04/03/2021   Posterior capsular opacification, right eye 04/03/2021   Posterior capsular opacification, left 04/03/2021   Thrombophilia (HCC) 04/10/2020   Chronic anticoagulation 12/19/2016   Chronic diastolic heart failure (HCC) 09/13/2015   Angina pectoris (HCC) 09/12/2015   Bacteriuria, asymptomatic 08/19/2015   Acute urinary obstruction 07/08/2015   Prolonged Q-T interval on ECG 07/08/2015   Severe OSA (obstructive sleep apnea) 06/15/2015   Pulmonary edema 05/20/2014   Syncope and collapse 05/20/2014   CHF (congestive heart failure), NYHA class I (HCC) 05/20/2014   Paroxysmal A-fib (HCC) 11/30/2013   Essential hypertension 11/30/2013   Type 2 diabetes mellitus with hyperlipidemia (HCC) 11/30/2013   Cardiomegaly 02/16/2013   Elevated PSA 02/16/2013   Gout 12/04/2009   Pure hypercholesterolemia 10/28/2009    Orientation  RESPIRATION BLADDER Height & Weight     Self, Time, Situation, Place  Other (Comment) (Home Cpap) Incontinent Weight: 248 lb 0.3 oz (112.5 kg) Height:  5' 4 (162.6 cm)  BEHAVIORAL SYMPTOMS/MOOD NEUROLOGICAL BOWEL NUTRITION STATUS      Continent Diet (See DC Summary)  AMBULATORY STATUS COMMUNICATION OF NEEDS Skin   Extensive Assist Verbally Normal                       Personal Care Assistance Level of Assistance  Bathing, Feeding, Dressing Bathing Assistance: Limited assistance Feeding assistance: Limited assistance Dressing Assistance: Limited assistance     Functional Limitations Info  Sight, Hearing, Speech Sight Info: Impaired Hearing Info: Adequate Speech Info: Adequate    SPECIAL CARE FACTORS FREQUENCY  PT (By licensed PT), OT (By licensed OT)     PT Frequency: 5x/week OT Frequency: 5x/week            Contractures Contractures Info: Not present    Additional Factors Info  Code Status, Allergies Code Status Info: Full Allergies Info: No known allergies           Current Medications (06/03/2024):  This is the current hospital active medication list Current Facility-Administered Medications  Medication Dose Route Frequency Provider Last Rate Last Admin   acetaminophen  (TYLENOL ) tablet 650 mg  650 mg Oral Q6H PRN Seena Marsa NOVAK, MD       Or   acetaminophen  (TYLENOL ) suppository 650 mg  650 mg Rectal Q6H PRN Seena Marsa NOVAK, MD       apixaban  (ELIQUIS ) tablet 5 mg  5 mg Oral BID Melvin, Alexander  B, MD   5 mg at 06/03/24 0803   aspirin  EC tablet 81 mg  81 mg Oral Daily Elgergawy, Dawood S, MD   81 mg at 06/03/24 0803   cefTRIAXone  (ROCEPHIN ) 1 g in sodium chloride  0.9 % 100 mL IVPB  1 g Intravenous Q24H Rai, Ripudeep K, MD 200 mL/hr at 06/03/24 0817 1 g at 06/03/24 0817   Chlorhexidine  Gluconate Cloth 2 % PADS 6 each  6 each Topical Daily Rai, Ripudeep K, MD   6 each at 06/03/24 0911   finasteride  (PROSCAR ) tablet 5 mg  5 mg Oral QPM Melvin,  Alexander B, MD   5 mg at 06/02/24 1712   insulin  aspart (novoLOG ) injection 0-9 Units  0-9 Units Subcutaneous TID WC Melvin, Alexander B, MD   1 Units at 06/03/24 9188   insulin  glargine-yfgn (SEMGLEE ) injection 10 Units  10 Units Subcutaneous QHS Rai, Ripudeep K, MD   10 Units at 06/02/24 2109   isosorbide  mononitrate (IMDUR ) 24 hr tablet 30 mg  30 mg Oral Daily Melvin, Alexander B, MD   30 mg at 06/03/24 9196   losartan  (COZAAR ) tablet 100 mg  100 mg Oral Daily Elgergawy, Dawood S, MD   100 mg at 06/03/24 9195   metoprolol  tartrate (LOPRESSOR ) tablet 12.5 mg  12.5 mg Oral BID Elgergawy, Dawood S, MD   12.5 mg at 06/03/24 0804   pantoprazole  (PROTONIX ) EC tablet 40 mg  40 mg Oral Daily Melvin, Alexander B, MD   40 mg at 06/03/24 0804   polyethylene glycol (MIRALAX  / GLYCOLAX ) packet 17 g  17 g Oral Daily PRN Seena Marsa NOVAK, MD       sodium chloride  flush (NS) 0.9 % injection 3 mL  3 mL Intravenous Q12H Melvin, Alexander B, MD   3 mL at 06/03/24 0818   tamsulosin  (FLOMAX ) capsule 0.4 mg  0.4 mg Oral Daily Melvin, Alexander B, MD   0.4 mg at 06/03/24 9195     Discharge Medications: Please see discharge summary for a list of discharge medications.  Relevant Imaging Results:  Relevant Lab Results:   Additional Information SSN: [id number]  Jeoffrey LITTIE Moose, LCSW

## 2024-06-03 NOTE — Progress Notes (Addendum)
 Occupational Therapy Treatment Patient Details Name: Raymond Gordon MRN: [id number] DOB: 08/18/1951 Today's Date: 06/03/2024   History of present illness Patient is a 73 year old male presenting with near syncope. PMH:  UTI, HTN, hyperlipidemia, DM, paroxysmal A-fib on Eliquis , chronic diastolic CHF, OSA, obesity, QT prolongation, gout, urinary retention with Foley   OT comments  Pt presented in bed but agreeable to session. Pt was limited due to BP in session as seen bellow but was asymptomatic. He was able to complete sit to stand transfers with min to min guard and ambulated with min assist and RW use. Patient will benefit from continued inpatient follow up therapy, <3 hours/day.  BP  Supine: 135/55 (75) Sitting 116/60 (60) Post ambulation 104/43 (58) Post ambulation second trial: 80/59 (79) Supine: 118/47 (67)        If plan is discharge home, recommend the following:  A little help with walking and/or transfers;A little help with bathing/dressing/bathroom;Assistance with cooking/housework;Assist for transportation;Help with stairs or ramp for entrance;Direct supervision/assist for financial management   Equipment Recommendations  None recommended by OT    Recommendations for Other Services      Precautions / Restrictions Precautions Precautions: Fall Recall of Precautions/Restrictions: Intact Restrictions Weight Bearing Restrictions Per Provider Order: No       Mobility Bed Mobility Overal bed mobility: Needs Assistance Bed Mobility: Supine to Sit     Supine to sit: Min assist Sit to supine: Min assist        Transfers Overall transfer level: Needs assistance Equipment used: Rolling walker (2 wheels) Transfers: Sit to/from Stand Sit to Stand: Min assist                 Balance Overall balance assessment: Needs assistance Sitting-balance support: No upper extremity supported, Feet supported Sitting balance-Leahy Scale: Fair     Standing balance  support: Bilateral upper extremity supported Standing balance-Leahy Scale: Fair Standing balance comment: Pt needs UE support for mobility                           ADL either performed or assessed with clinical judgement   ADL Overall ADL's : Needs assistance/impaired Eating/Feeding: Independent;Sitting   Grooming: Wash/dry hands;Set up;Sitting   Upper Body Bathing: Supervision/ safety;Sitting   Lower Body Bathing: Minimal assistance;Sit to/from stand   Upper Body Dressing : Supervision/safety;Sitting   Lower Body Dressing: Moderate assistance;Sit to/from stand   Toilet Transfer: Minimal assistance;Ambulation;Rolling walker (2 wheels)   Toileting- Clothing Manipulation and Hygiene: Minimal assistance;Sit to/from stand       Functional mobility during ADLs: Minimal assistance;Rolling walker (2 wheels)      Extremity/Trunk Assessment Upper Extremity Assessment Upper Extremity Assessment: Overall WFL for tasks assessed   Lower Extremity Assessment Lower Extremity Assessment: Defer to PT evaluation        Vision   Vision Assessment?: No apparent visual deficits   Perception Perception Perception: Within Functional Limits   Praxis Praxis Praxis: WFL   Communication Communication Communication: No apparent difficulties Factors Affecting Communication: Reduced clarity of speech   Cognition Arousal: Alert Behavior During Therapy: WFL for tasks assessed/performed Cognition: No apparent impairments                               Following commands: Intact        Cueing   Cueing Techniques: Verbal cues, Tactile cues  Exercises      Shoulder  Instructions       General Comments      Pertinent Vitals/ Pain       Pain Assessment Pain Assessment: No/denies pain  Home Living                                          Prior Functioning/Environment              Frequency  Min 2X/week        Progress  Toward Goals  OT Goals(current goals can now be found in the care plan section)  Progress towards OT goals: Progressing toward goals  Acute Rehab OT Goals Patient Stated Goal: to get better OT Goal Formulation: With patient Time For Goal Achievement: 06/15/24 Potential to Achieve Goals: Good ADL Goals Pt Will Perform Grooming: with supervision;standing Pt Will Perform Lower Body Bathing: with supervision;sit to/from stand Pt Will Perform Lower Body Dressing: with supervision;sit to/from stand Pt Will Transfer to Toilet: with supervision;ambulating;bedside commode Pt Will Perform Toileting - Clothing Manipulation and hygiene: with supervision;sit to/from stand  Plan      Co-evaluation                 AM-PAC OT 6 Clicks Daily Activity     Outcome Measure   Help from another person eating meals?: None Help from another person taking care of personal grooming?: A Little Help from another person toileting, which includes using toliet, bedpan, or urinal?: A Little Help from another person bathing (including washing, rinsing, drying)?: A Little Help from another person to put on and taking off regular upper body clothing?: A Little Help from another person to put on and taking off regular lower body clothing?: A Lot 6 Click Score: 18    End of Session Equipment Utilized During Treatment: Gait belt;Rolling walker (2 wheels)  OT Visit Diagnosis: Unsteadiness on feet (R26.81);Other abnormalities of gait and mobility (R26.89);Muscle weakness (generalized) (M62.81);Pain Pain - Right/Left: Right Pain - part of body: Ankle and joints of foot   Activity Tolerance Patient tolerated treatment well   Patient Left in chair;with call bell/phone within reach;with nursing/sitter in room   Nurse Communication Mobility status        Time: 8991-8942 OT Time Calculation (min): 49 min  Charges: OT General Charges $OT Visit: 1 Visit OT Treatments $Self Care/Home Management : 38-52  mins  Warrick POUR OTR/L  Acute Rehab Services  [phone] office number   Warrick Berber 06/03/2024, 11:07 AM

## 2024-06-03 NOTE — TOC Initial Note (Signed)
 Transition of Care [facility]) - Initial/Assessment Note    Patient Details  Name: Raymond Gordon MRN: [id number] Date of Birth: 1951-06-16  Transition [facility]) CM/SW Contact:    Inocente GORMAN Kindle, LCSW Phone Number: 06/03/2024, 11:51 AM  Clinical Narrative:                 CSW received consult for possible SNF placement at time of discharge. CSW spoke with patient's sister as requested. She reported being currently unable to care for patient at their home given patient's current physical needs and fall risk. Patient's sister expressed understanding of PT recommendation and is agreeable to SNF placement at time of discharge. Patient reports preference to return to [facility]. Patient was at [facility] from 4/18-5/27 and sister is aware that he had copays during that time and will again since he has not had a 60 day wellness period. CSW discussed insurance authorization process and will provide Medicare SNF ratings list. CSW will send out referrals for review and provide bed offers as available. CSW went over process of how to apply for Medicaid for long term care if she feels patient will eventually need it. She reported understanding.     Skilled Nursing Rehab Facilities-   [email or web address]   Ratings out of 5 stars (5 the highest)  Name Address  Phone # Quality Care Staffing Health Inspection Overall  [facility] [address] [place] [phone] 3 1 4 3   [facility] [address], [place] [phone] 5 2 4 5   [facility] [address], [facility] [phone] 2 1 1 1   [facility] [address] [place] [phone] 4 3 4 4   [facility]  [address], [place] [phone] 5 3 5 5   [facility] [address] [facility] [phone] 5 3 2 3   [facility] [address] [place] [phone] 5 1 2 2   [facility] [address] [place] [phone] 2 3 4 4   [address]  ([facility]) [address] [place] [phone] 1 3 3 2   [facility] [address] [place], [place] [phone] 3 1 2 1   [facility] ([place]) [address], [place] [phone] 3 1 1 1   Clotilda Pereyra [address] [place] [phone] 3 3 4 4   [facility] [address], [place] [phone] 4 1 2 1   [facility] ([facility]) [address], [place] [phone] 1 1 4 2           [facility] [address], [place] [phone] 3 1 5 4   [facility] [address], [place] [phone] 4 1 1 1   [facility]  [address] [place] [phone] 2 3 1 1   [facility] [place] [address] [phone] 2 2 4 4   [facility] [address] [name] [address], [place] [phone] 2 2 3 3           [facility] [address], [place] [phone] 2 1 2 1   [facility] (No U[facility]) [address], [place] [phone] 5 4 4 5   [facility] [address], [facility] [phone] 3 4 4 4   [facility] [address] [place] [phone] 3 1 2 1   [place] [address] [place] [phone] 3 2 2 2   [facility] [address] [place] [phone] 1 3 3 2   [facility] [address] [place] [phone] 2 2 3 3   [facility]  [address] [place], [place] [phone] 2 1 4 3   [facility] [address], [facility] [phone] 2 1 1 1   [facility] [address] [place], [place] [phone] 2 1 2 1   [facility] [address] [place] [phone] 3 1 2 1           [facility] [address] [place] [phone] 4 1 2 1   [facility] [address] [place]  [phone] 3 4 4 4   [facility] (No Humana) [address] [place] [phone] 3 2 5 5   [facility]) [address], [place] [phone] 2 2 3 3   [facility] [address],  [place] [phone] 5 3 5 5   [facility] [address], [place] [phone] 2 1 1 1   [facility] [address] [place], [place] [phone] 3 5 5 5           [facility] [address] [place], [place] [phone] 5 4 5 5   [facility])  [address] [place] [phone] 1 1 2 1   [facility]) [address] [place] [phone]  2 4 4   [facility] [place] [address], [place] [phone] 3 5 5 5   [address] [place], [place] [phone] 4 2 2 2   [facility]) [address] [place] [phone] 1 1 3 1   [facility] [address] [place] [phone] 2 2 2 2      Expected Discharge Plan: Skilled Nursing Facility Barriers to Discharge: English as a second language teacher, Continued Medical Work up   Patient Goals and CMS Choice Patient states their goals for this hospitalization and ongoing recovery are:: Rehab CMS Medicare.gov Compare Post Acute Care list provided to:: Patient Represenative (must comment) Choice offered to / list presented to : Sibling [place] ownership interest in [facility].provided to:: Adult Children    Expected Discharge Plan and Services In-house Referral: Clinical Social Work   Post Acute Care Choice: Skilled Nursing Facility Living arrangements for the past 2 months: Single Family Home                                      Prior Living Arrangements/Services Living arrangements for the past 2 months: Single Family Home   Patient language and need for interpreter reviewed:: Yes Do you feel safe going back to the place where you live?: Yes      Need for Family Participation in Patient Care: Yes (Comment) Care giver support system in place?: Yes (comment)   Criminal Activity/Legal Involvement Pertinent to Current Situation/Hospitalization: No - Comment as needed  Activities of Daily Living   ADL Screening  (condition at time of admission) Is the patient deaf or have difficulty hearing?: No Does the patient have difficulty seeing, even when wearing glasses/contacts?: Yes Does the patient have difficulty concentrating, remembering, or making decisions?: No  Permission Sought/Granted Permission sought to share information with : Facility Medical sales representative, Family Supports Permission granted to share information with : Yes, Verbal Permission Granted  Share Information with NAME: [name]  Permission granted to share info w AGENCY: SNFs  Permission granted to share info w Relationship: Sister  Permission granted to share info w Contact Information: [phone]  Emotional Assessment Appearance:: Appears stated age Attitude/Demeanor/Rapport: Engaged Affect (typically observed): Accepting Orientation: : Oriented  to Self, Oriented to Place, Oriented to  Time, Oriented to Situation Alcohol  / Substance Use: Not Applicable Psych Involvement: No (comment)  Admission diagnosis:  AKI (acute kidney injury) (HCC) [N17.9] Falls [R29.6] Hypotension, unspecified hypotension type [I95.9] Patient Active Problem List   Diagnosis Date Noted   AKI (acute kidney injury) (HCC) 05/31/2024   Obesity, class 3 03/10/2024   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   Pain due to onychomycosis of toenails of both feet 04/12/2021   Vitreomacular adhesion of both eyes 04/03/2021   Moderate nonproliferative diabetic retinopathy of both eyes without macular edema associated with type 2 diabetes mellitus (HCC) 04/03/2021   Posterior capsular opacification, right eye 04/03/2021   Posterior capsular opacification, left 04/03/2021   Thrombophilia (HCC) 04/10/2020   Chronic anticoagulation 12/19/2016   Chronic diastolic heart failure (HCC) 09/13/2015   Angina pectoris (HCC) 09/12/2015   Bacteriuria, asymptomatic 08/19/2015   Acute urinary obstruction 07/08/2015   Prolonged Q-T interval on ECG 07/08/2015   Severe OSA  (obstructive sleep apnea) 06/15/2015   Pulmonary edema 05/20/2014   Syncope and collapse 05/20/2014    Class: Acute   CHF (congestive heart failure), NYHA class I (HCC) 05/20/2014   Paroxysmal A-fib (HCC) 11/30/2013   Essential hypertension 11/30/2013   Type 2 diabetes mellitus with hyperlipidemia (HCC) 11/30/2013   Cardiomegaly 02/16/2013   Elevated PSA 02/16/2013   Gout 12/04/2009   Pure hypercholesterolemia 10/28/2009   PCP:  [name], MD Pharmacy:   [id number] [address]), [place] - [address] [place] ([place]) [name] [address] Phone: [phone] Fax: [phone]  [facility] [place], [name] - [address] [name] [address] Phone: [phone] Fax: [phone]     Social Drivers of Health (SDOH) Social History: SDOH Screenings   Food Insecurity: No Food Insecurity (06/01/2024)  Housing: Low Risk  (06/01/2024)  Transportation Needs: No Transportation Needs (06/03/2024)  Utilities: Not At Risk (06/03/2024)  Social Connections: Moderately Integrated (06/01/2024)  Tobacco Use: Medium Risk (05/04/2024)   SDOH Interventions:     Readmission Risk Interventions     No data to display

## 2024-06-03 NOTE — Progress Notes (Signed)
 [facility]                                                                              Raymond Gordon, is a 73 y.o. male, DOB - February 12, 1951, FMW:[id number] Admit date - 05/31/2024    Outpatient Primary MD for the patient is [name], [name], MD  LOS - 2  days  Chief Complaint  Patient presents with   Fall       Brief summary   Patient is a 73 year old male with HTN, hyperlipidemia, DM, paroxysmal A-fib on Eliquis , chronic diastolic CHF, OSA, obesity, QT prolongation, gout, urinary retention with Foley presenting with near syncope.  Patient reported multiple falls in the past several days with lightheadedness, no syncope or hitting his head.  Reported intermittent dizziness preceding his fall.  EMS was called and systolic BP was noted to be in 80s. Patient was admitted 03/08/2024-03/12/2024 for heart failure exacerbation. Discharge weight was 127 kg at that time.  Current weight is 110 kg.  Reports chronic lower extremity edema.  Previous notes indicate possible lymphedema.  In ED, lowest BP 73/48, improved 211/43, temp 97.6 F, RR 21, pulse 62 O2 sats 94% on room air creatinine 2.66, baseline 1.2, chest x-ray showed no acute abnormality.  Assessment & Plan    AKI (acute kidney injury) (HCC) with hyperkalemia -Presented with creatinine of 2.66, potassium 5.3, metabolic acidosis - Lasix , Aldactone , losartan , metoprolol , doxazosin  held, placed on gentle hydration - AKI most likely in the setting of volume depletion/low blood pressure from her antihypertensive medications and diuretics.  - Renal function much improved, back to baseline - Potassium has normalized - He was orthostatic this morning with PT, still continue with gentle hydration  Near syncope, falls - Likely due to acute kidney injury, overdiuresis, - Had a recent 2D echo 02/2024 which had shown EF of 55- 60%, left ventricular diastolic parameters, right ventricular systolic function normal - Improving with IV  fluids - PT/OT consulted - Patient is orthostatic today, so we will continue with IV fluid and hold on restarting Lasix  and Aldactone  - Will apply TED hose    Essential hypertension  -Continue to hold Lasix , Aldactone , losartan , metoprolol , doxazosin  and Imdur . - Patient on multiple medications, started by different providers. - Continue to hold Cardura  which was ordered by his PCP at [facility] for high blood pressure and BPH. - Continue to hold Imdur  as unclear who started patient on this med. - He will be started on low-dose metoprolol  specially with a known history of A-fib. -He is started on losartan  given history of diabetes. - Remains orthostatic so we will hold on resuming Lasix  and Aldactone . - Discontinue Cardura  altogether as patient is on Proscar  by his urologist. -Is not anymore on Flomax     Chronic diastolic CHF - 2D echo 02/2024 had shown EF of 55- 60%, left ventricular diastolic parameters, right ventricular systolic function normal - Patient was discharged on Lasix  80 mg twice daily, Imdur , losartan , doxazosin , metoprolol , currently on hold -  Discharge weight was 127 kg, weight on admission 110 kg.  Suspect patient is volume down leading to AKI as above. - Appears to  be euvolemic today, but he remains orthostatic so hold on resuming diuresis    UTI -UA positive for UTI, follow urine culture and sensitivities, - Urine culture growing Serratia, sensitive to Rocephin , will continue.   Diabetes mellitus type 2, IDDM hyperglycemia Hemoglobin A1c 7.9 on 03/08/2024 CBG (last 3)  Recent Labs    06/02/24 2039 06/03/24 0727 06/03/24 1120  GLUCAP 194* 149* 199*   Continue sliding scale insulin , increase Lantus  to 10 units at bedtime  (outpatient on 15 units nightly)   Paroxysmal atrial fibrillation - Holding home metoprolol  as above, will resume at low-dose metoprolol  to avoid RVR. - Continue home Eliquis    OSA - Continue home CPAP   History of QT  prolongation - Avoid QT prolonging meds, correct electrolyte abnormalities   Gout - Holding allopurinol  in the setting of AKI   Urinary retention - Continue home finasteride  (finasteride  prescribed by his urologist Dr. Devere, he was prescribed Cardura  by his primary care physician which has been discontinued, and he is not anymore on Flomax ) - Foley in place, inserted last month June 30, but now he has UTI it needs to be replaced, so actually I will do a voiding trial for him today, DC Foley catheter today, if he has evidence of retention I will reinsert.    Obesity class II Estimated body mass index is 42.57 kg/m as calculated from the following:   Height as of this encounter: 5' 4 (1.626 m).   Weight as of this encounter: 112.5 kg.  Code Status: Full CODE STATUS DVT Prophylaxis:  Place and maintain sequential compression device Start: 06/03/24 1123 Place TED hose Start: 06/03/24 1123 apixaban  (ELIQUIS ) tablet 5 mg   Level of Care: Level of care: Progressive Family Communication: Discussed with sister at bedside Disposition Plan:      SNF versus home home health  Procedures:    Consultants:   None  Antimicrobials:   Anti-infectives (From admission, onward)    Start     Dose/Rate Route Frequency Ordered Stop   06/01/24 1000  cefTRIAXone  (ROCEPHIN ) 1 g in sodium chloride  0.9 % 100 mL IVPB        1 g 200 mL/hr over 30 Minutes Intravenous Every 24 hours 06/01/24 0947            Medications  apixaban   5 mg Oral BID   aspirin  EC  81 mg Oral Daily   Chlorhexidine  Gluconate Cloth  6 each Topical Daily   finasteride   5 mg Oral QPM   insulin  aspart  0-9 Units Subcutaneous TID WC   insulin  glargine-yfgn  10 Units Subcutaneous QHS   losartan   100 mg Oral Daily   metoprolol  tartrate  12.5 mg Oral BID   pantoprazole   40 mg Oral Daily   sodium chloride  flush  3 mL Intravenous Q12H      Subjective:   Raymond Gordon was orthostatic with PT earlier today, but he was  asymptomatic Objective:   Vitals:   06/03/24 0400 06/03/24 0500 06/03/24 0725 06/03/24 1120  BP: (!) 138/59  (!) 174/60 (!) 105/48  Pulse: (!) 56  65 (!) 51  Resp: 16  17 17   Temp: 98 F (36.7 C)  97.9 F (36.6 C) (!) 97.4 F (36.3 C)  TempSrc: Axillary  Oral Oral  SpO2: 100%  100% 100%  Weight:  112.5 kg    Height:        Intake/Output Summary (Last 24 hours) at 06/03/2024 1346 Last data filed at 06/03/2024 9149 Gross  per 24 hour  Intake --  Output 1800 ml  Net -1800 ml     Wt Readings from Last 3 Encounters:  06/03/24 112.5 kg  03/31/24 120.3 kg  03/12/24 128.5 kg     Exam Awake Alert, Oriented X 3, No new F.N deficits, Normal affect Symmetrical Chest wall movement, Good air movement bilaterally, CTAB RRR,No Gallops,Rubs or new Murmurs, No Parasternal Heave +ve B.Sounds, Abd Soft, No tenderness, No rebound - guarding or rigidity. No Cyanosis, Clubbing or edema, he is having chronic lower extremity skin changes, have wrinkled lower extremity which indicates he has significant edema which has resolved    Data Reviewed:  I have personally reviewed following labs    CBC Lab Results  Component Value Date   WBC 5.4 06/03/2024   RBC 4.12 (L) 06/03/2024   HGB 11.4 (L) 06/03/2024   HCT 35.6 (L) 06/03/2024   MCV 86.4 06/03/2024   MCH 27.7 06/03/2024   PLT 279 06/03/2024   MCHC 32.0 06/03/2024   RDW 17.1 (H) 06/03/2024   LYMPHSABS 1.3 05/31/2024   MONOABS 0.5 05/31/2024   EOSABS 0.1 05/31/2024   BASOSABS 0.0 05/31/2024     Last metabolic panel Lab Results  Component Value Date   NA 139 06/03/2024   K 4.3 06/03/2024   CL 108 06/03/2024   CO2 20 (L) 06/03/2024   BUN 51 (H) 06/03/2024   CREATININE 1.08 06/03/2024   GLUCOSE 157 (H) 06/03/2024   GFRNONAA >60 06/03/2024   GFRAA >60 08/21/2015   CALCIUM  9.5 06/03/2024   PHOS 2.2 (L) 06/03/2024   PROT 7.5 06/01/2024   ALBUMIN 2.7 (L) 06/03/2024   LABGLOB 3.1 11/02/2021   AGRATIO 1.5 11/02/2021   BILITOT  0.5 06/01/2024   ALKPHOS 76 06/01/2024   AST 17 06/01/2024   ALT 21 06/01/2024   ANIONGAP 11 06/03/2024    CBG (last 3)  Recent Labs    06/02/24 2039 06/03/24 0727 06/03/24 1120  GLUCAP 194* 149* 199*      Coagulation Profile: No results for input(s): INR, PROTIME in the last 168 hours.   Radiology Studies: I have personally reviewed the imaging studies  No results found.      Brayton Lye M.D. [facility] 06/03/2024, 1:46 PM  Available via Epic secure chat 7am-7pm After 7 pm, please refer to night coverage provider listed on amion.

## 2024-06-03 NOTE — Plan of Care (Signed)
  Problem: Coping: Goal: Ability to adjust to condition or change in health will improve Outcome: Progressing   Problem: Fluid Volume: Goal: Ability to maintain a balanced intake and output will improve Outcome: Progressing   Problem: Health Behavior/Discharge Planning: Goal: Ability to manage health-related needs will improve Outcome: Progressing   Problem: Nutritional: Goal: Maintenance of adequate nutrition will improve Outcome: Progressing   Problem: Tissue Perfusion: Goal: Adequacy of tissue perfusion will improve Outcome: Progressing   Problem: Education: Goal: Knowledge of General Education information will improve Description: Including pain rating scale, medication(s)/side effects and non-pharmacologic comfort measures Outcome: Progressing   Problem: Clinical Measurements: Goal: Will remain free from infection Outcome: Progressing Goal: Diagnostic test results will improve Outcome: Progressing   Problem: Activity: Goal: Risk for activity intolerance will decrease Outcome: Progressing   Problem: Nutrition: Goal: Adequate nutrition will be maintained Outcome: Progressing

## 2024-06-04 DIAGNOSIS — N179 Acute kidney failure, unspecified: Secondary | ICD-10-CM | POA: Diagnosis not present

## 2024-06-04 LAB — RENAL FUNCTION PANEL
Albumin: 3 g/dL — ABNORMAL LOW (ref 3.5–5.0)
Anion gap: 10 (ref 5–15)
BUN: 42 mg/dL — ABNORMAL HIGH (ref 8–23)
CO2: 21 mmol/L — ABNORMAL LOW (ref 22–32)
Calcium: 9.7 mg/dL (ref 8.9–10.3)
Chloride: 110 mmol/L (ref 98–111)
Creatinine, Ser: 1.03 mg/dL (ref 0.61–1.24)
GFR, Estimated: >60 mL/min (ref >60)
Glucose, Bld: 162 mg/dL — ABNORMAL HIGH (ref 70–99)
Phosphorus: 2.1 mg/dL — ABNORMAL LOW (ref 2.5–4.6)
Potassium: 4.2 mmol/L (ref 3.5–5.1)
Sodium: 141 mmol/L (ref 135–145)

## 2024-06-04 LAB — GLUCOSE, CAPILLARY
Glucose-Capillary: 158 mg/dL — ABNORMAL HIGH (ref 70–99)
Glucose-Capillary: 172 mg/dL — ABNORMAL HIGH (ref 70–99)
Glucose-Capillary: 178 mg/dL — ABNORMAL HIGH (ref 70–99)
Glucose-Capillary: 182 mg/dL — ABNORMAL HIGH (ref 70–99)

## 2024-06-04 NOTE — Progress Notes (Signed)
 Physical Therapy Treatment Patient Details Name: Raymond Gordon MRN: [id number] DOB: 1951/06/22 Today's Date: 06/04/2024   History of Present Illness Patient is a 73 year old male presenting with near syncope. PMH:  UTI, HTN, hyperlipidemia, DM, paroxysmal A-fib on Eliquis , chronic diastolic CHF, OSA, obesity, QT prolongation, gout, urinary retention with Foley    PT Comments  Pt tolerated treatment well today. Pt with similar presentation to previous session. Pt able to ambulate short distance in room with Min A RW. Pt now with Ted hose and appeared to have helped with orthostatics. BP: 148/54 supine, BP: 134/48 seated, BP: 128/47 standing. No change in DC/DME recs at this time. PT will continue to follow.     If plan is discharge home, recommend the following: A lot of help with walking and/or transfers;A little help with bathing/dressing/bathroom;Assistance with cooking/housework;Direct supervision/assist for medications management;Direct supervision/assist for financial management;Assist for transportation;Help with stairs or ramp for entrance;Supervision due to cognitive status   Can travel by private vehicle     No  Equipment Recommendations  Rolling walker (2 wheels)    Recommendations for Other Services       Precautions / Restrictions Precautions Precautions: Fall Recall of Precautions/Restrictions: Intact Restrictions Weight Bearing Restrictions Per Provider Order: No     Mobility  Bed Mobility Overal bed mobility: Needs Assistance Bed Mobility: Supine to Sit     Supine to sit: Min assist Sit to supine: Min assist, Mod assist   General bed mobility comments: Assistance for trunk elevation and BLE management.    Transfers Overall transfer level: Needs assistance Equipment used: Rolling walker (2 wheels) Transfers: Sit to/from Stand Sit to Stand: Min assist           General transfer comment: Cues for hand placement. Min A to steady and power up.     Ambulation/Gait Ambulation/Gait assistance: Min assist, +2 safety/equipment Gait Distance (Feet): 15 Feet Assistive device: Rolling walker (2 wheels) Gait Pattern/deviations: Decreased stride length, Step-through pattern, Shuffle Gait velocity: decreased     General Gait Details: Pt with very short almost shuffling like gait pattern. Very high fear of falling and needs lots of reassurance. no LOB noted noted. Fatigued very quickly. Chair follow provided.   Stairs             Wheelchair Mobility     Tilt Bed    Modified Rankin (Stroke Patients Only)       Balance Overall balance assessment: Needs assistance Sitting-balance support: No upper extremity supported, Feet supported Sitting balance-Leahy Scale: Fair     Standing balance support: Bilateral upper extremity supported Standing balance-Leahy Scale: Fair Standing balance comment: Pt needs UE support for mobility                            Communication Communication Communication: No apparent difficulties Factors Affecting Communication: Reduced clarity of speech  Cognition Arousal: Alert Behavior During Therapy: WFL for tasks assessed/performed                             Following commands: Intact      Cueing Cueing Techniques: Verbal cues, Tactile cues  Exercises      General Comments General comments (skin integrity, edema, etc.): BP: 148/54 supine, BP: 134/48 seated, BP: 128/47 standing.      Pertinent Vitals/Pain      Home Living  Prior Function            PT Goals (current goals can now be found in the care plan section) Progress towards PT goals: Progressing toward goals    Frequency    Min 2X/week      PT Plan      Co-evaluation              AM-PAC PT 6 Clicks Mobility   Outcome Measure  Help needed turning from your back to your side while in a flat bed without using bedrails?: A Little Help needed  moving from lying on your back to sitting on the side of a flat bed without using bedrails?: A Little Help needed moving to and from a bed to a chair (including a wheelchair)?: A Little Help needed standing up from a chair using your arms (e.g., wheelchair or bedside chair)?: A Little Help needed to walk in hospital room?: A Lot Help needed climbing 3-5 steps with a railing? : Total 6 Click Score: 15    End of Session Equipment Utilized During Treatment: Gait belt Activity Tolerance: Patient tolerated treatment well Patient left: in bed;with call bell/phone within reach;with bed alarm set Nurse Communication: Mobility status PT Visit Diagnosis: Other abnormalities of gait and mobility (R26.89)     Time: 8575-8553 PT Time Calculation (min) (ACUTE ONLY): 22 min  Charges:    $Gait Training: 8-22 mins PT General Charges $$ ACUTE PT VISIT: 1 Visit                     Sueellen NOVAK, PT, DPT Acute Rehab Services [phone]    Raymond Gordon 06/04/2024, 4:20 PM

## 2024-06-04 NOTE — Plan of Care (Signed)
  Problem: Coping: Goal: Ability to adjust to condition or change in health will improve Outcome: Progressing   Problem: Fluid Volume: Goal: Ability to maintain a balanced intake and output will improve Outcome: Progressing   Problem: Metabolic: Goal: Ability to maintain appropriate glucose levels will improve Outcome: Progressing   Problem: Nutritional: Goal: Maintenance of adequate nutrition will improve Outcome: Progressing   Problem: Skin Integrity: Goal: Risk for impaired skin integrity will decrease Outcome: Progressing   Problem: Health Behavior/Discharge Planning: Goal: Ability to manage health-related needs will improve Outcome: Progressing

## 2024-06-04 NOTE — Plan of Care (Signed)
 Pt has rested quietly throughout the night with no distress noted. Alert and oriented. On room air. SR on the monitor. Purewick intact to suction. Pt is voiding. Bladder scan was 161. First void 300, 2nd 200 and third 400. No complaints voiced.     Problem: Education: Goal: Knowledge of General Education information will improve Description: Including pain rating scale, medication(s)/side effects and non-pharmacologic comfort measures Outcome: Progressing   Problem: Clinical Measurements: Goal: Respiratory complications will improve Outcome: Progressing Goal: Cardiovascular complication will be avoided Outcome: Progressing   Problem: Coping: Goal: Level of anxiety will decrease Outcome: Progressing   Problem: Pain Managment: Goal: General experience of comfort will improve and/or be controlled Outcome: Progressing

## 2024-06-04 NOTE — Progress Notes (Signed)
 [facility]                                                                              [name], is a 73 y.o. male, DOB - [birth year], FMW:[id number] Admit date - 05/31/2024    Outpatient Primary MD for the patient is [name], [name], MD  LOS - 3  days  Chief Complaint  Patient presents with   Fall       Brief summary   Patient is a 73 year old male with HTN, hyperlipidemia, DM, paroxysmal A-fib on Eliquis , chronic diastolic CHF, OSA, obesity, QT prolongation, gout, urinary retention with Foley presenting with near syncope.  Patient reported multiple falls in the past several days with lightheadedness, no syncope or hitting his head.  Reported intermittent dizziness preceding his fall.  EMS was called and systolic BP was noted to be in 80s. Patient was admitted 03/08/2024-03/12/2024 for heart failure exacerbation. Discharge weight was 127 kg at that time.  Current weight is 110 kg.  Reports chronic lower extremity edema.  Previous notes indicate possible lymphedema.  In ED, lowest BP 73/48, improved 211/43, temp 97.6 F, RR 21, pulse 62 O2 sats 94% on room air creatinine 2.66, baseline 1.2, chest x-ray showed no acute abnormality.  Assessment & Plan    AKI (acute kidney injury) (HCC) with hyperkalemia -Presented with creatinine of 2.66, potassium 5.3, metabolic acidosis - Lasix , Aldactone , losartan , metoprolol , doxazosin  held, placed on gentle hydration - AKI most likely in the setting of volume depletion/low blood pressure from her antihypertensive medications and diuretics.  - Renal function much improved, back to baseline - Potassium has normalized   Near syncope, falls - Likely due to acute kidney injury, overdiuresis, - Had a recent 2D echo 02/2024 which had shown EF of 55- 60%, left ventricular diastolic parameters, right ventricular systolic function normal - Improving with IV fluids - PT/OT consulted - Patient is orthostatic , will hold on restarting  Lasix  and Aldactone  - Patient orthostatic yesterday, will recheck again today, but clinically he is much improved after TED hose    Essential hypertension  -Continue to hold Lasix , Aldactone , losartan , metoprolol , doxazosin  and Imdur . - Patient on multiple medications, started by different providers. - Continue to hold Cardura  which was ordered by his PCP at [facility] for high blood pressure and BPH. - Continue to hold Imdur  as unclear who started patient on this med. - He will be started on low-dose metoprolol  specially with a known history of A-fib. -He is started on losartan  given history of diabetes. - Remains orthostatic so we will hold on resuming Lasix  and Aldactone . - Discontinue Cardura  altogether as patient is on Proscar  by his urologist. -Is not anymore on Flomax     Chronic diastolic CHF - 2D echo 02/2024 had shown EF of 55- 60%, left ventricular diastolic parameters, right ventricular systolic function normal - Patient was discharged on Lasix  80 mg twice daily, Imdur , losartan , doxazosin , metoprolol , currently on hold -  Discharge weight was 127 kg, weight on admission 110 kg.  Suspect patient is volume down leading to AKI as above. - Appears to be euvolemic today, but he remains orthostatic  so hold on resuming diuresis    UTI -UA positive for UTI, follow urine culture and sensitivities, - Urine culture growing Serratia, sensitive to Rocephin , will continue.   Diabetes mellitus type 2, IDDM hyperglycemia Hemoglobin A1c 7.9 on 03/08/2024 CBG (last 3)  Recent Labs    06/03/24 2030 06/04/24 0812 06/04/24 1148  GLUCAP 150* 158* 172*   Continue sliding scale insulin , increase Lantus  to 10 units at bedtime  (outpatient on 15 units nightly)   Paroxysmal atrial fibrillation - Holding home metoprolol  as above, will resume at low-dose metoprolol  to avoid RVR. - Continue home Eliquis    OSA - Continue home CPAP   History of QT prolongation - Avoid QT prolonging  meds, correct electrolyte abnormalities   Gout - Holding allopurinol  in the setting of AKI   Urinary retention - Continue home finasteride  (finasteride  prescribed by his urologist Dr. Devere, he was prescribed Cardura  by his primary care physician which has been discontinued, and he is not anymore on Flomax ) - Foley in place, inserted last April, and was exchanged most recently on June 30, but now he has UTI it needs to be replaced, so actually I will do a voiding trial for him Foley catheter was discharged yesterday, so far he is able to urinate with no retention, so we will continue to monitor without reinserting Foley catheter.   Obesity class II Estimated body mass index is 42.57 kg/m as calculated from the following:   Height as of this encounter: 5' 4 (1.626 m).   Weight as of this encounter: 112.5 kg.  Code Status: Full CODE STATUS DVT Prophylaxis:  Place and maintain sequential compression device Start: 06/03/24 1123 Place TED hose Start: 06/03/24 1123 apixaban  (ELIQUIS ) tablet 5 mg   Level of Care: Level of care: Progressive Family Communication: Discussed with sister at bedside 7/10 Disposition Plan:      SNF versus home home health  Procedures:    Consultants:   None  Antimicrobials:   Anti-infectives (From admission, onward)    Start     Dose/Rate Route Frequency Ordered Stop   06/01/24 1000  cefTRIAXone  (ROCEPHIN ) 1 g in sodium chloride  0.9 % 100 mL IVPB        1 g 200 mL/hr over 30 Minutes Intravenous Every 24 hours 06/01/24 0947            Medications  apixaban   5 mg Oral BID   aspirin  EC  81 mg Oral Daily   Chlorhexidine  Gluconate Cloth  6 each Topical Daily   finasteride   5 mg Oral QPM   insulin  aspart  0-9 Units Subcutaneous TID WC   insulin  glargine-yfgn  10 Units Subcutaneous QHS   losartan   100 mg Oral Daily   metoprolol  tartrate  12.5 mg Oral BID   pantoprazole   40 mg Oral Daily   sodium chloride  flush  3 mL Intravenous Q12H       Subjective:   Raymond Gordon was able to urinate overnight with no evidence of retention Objective:   Vitals:   06/04/24 0400 06/04/24 0426 06/04/24 0810 06/04/24 1145  BP:  (!) 150/45 (!) 181/44 (!) 117/54  Pulse:  61 66 (!) 53  Resp: 18 16 15 18   Temp:  98 F (36.7 C) 97.7 F (36.5 C) (!) 97.5 F (36.4 C)  TempSrc:  Oral Oral Oral  SpO2:  100% 100% 97%  Weight:      Height:        Intake/Output Summary (Last 24 hours) at 06/04/2024  1317 Last data filed at 06/04/2024 0600 Gross per 24 hour  Intake 0 ml  Output 900 ml  Net -900 ml     Wt Readings from Last 3 Encounters:  06/03/24 112.5 kg  03/31/24 120.3 kg  03/12/24 128.5 kg     Exam Awake Alert, Oriented X 3, No new F.N deficits, Normal affect Symmetrical Chest wall movement, Good air movement bilaterally, CTAB RRR,No Gallops,Rubs or new Murmurs, No Parasternal Heave +ve B.Sounds, Abd Soft, No tenderness, No rebound - guarding or rigidity. Wearing a TED hose     Data Reviewed:  I have personally reviewed following labs    CBC Lab Results  Component Value Date   WBC 5.4 06/03/2024   RBC 4.12 (L) 06/03/2024   HGB 11.4 (L) 06/03/2024   HCT 35.6 (L) 06/03/2024   MCV 86.4 06/03/2024   MCH 27.7 06/03/2024   PLT 279 06/03/2024   MCHC 32.0 06/03/2024   RDW 17.1 (H) 06/03/2024   LYMPHSABS 1.3 05/31/2024   MONOABS 0.5 05/31/2024   EOSABS 0.1 05/31/2024   BASOSABS 0.0 05/31/2024     Last metabolic panel Lab Results  Component Value Date   NA 141 06/04/2024   K 4.2 06/04/2024   CL 110 06/04/2024   CO2 21 (L) 06/04/2024   BUN 42 (H) 06/04/2024   CREATININE 1.03 06/04/2024   GLUCOSE 162 (H) 06/04/2024   GFRNONAA >60 06/04/2024   GFRAA >60 08/21/2015   CALCIUM  9.7 06/04/2024   PHOS 2.1 (L) 06/04/2024   PROT 7.5 06/01/2024   ALBUMIN 3.0 (L) 06/04/2024   LABGLOB 3.1 11/02/2021   AGRATIO 1.5 11/02/2021   BILITOT 0.5 06/01/2024   ALKPHOS 76 06/01/2024   AST 17 06/01/2024   ALT 21 06/01/2024    ANIONGAP 10 06/04/2024    CBG (last 3)  Recent Labs    06/03/24 2030 06/04/24 0812 06/04/24 1148  GLUCAP 150* 158* 172*      Coagulation Profile: No results for input(s): INR, PROTIME in the last 168 hours.   Radiology Studies: I have personally reviewed the imaging studies  No results found.      Brayton Lye M.D. [facility] 06/04/2024, 1:17 PM  Available via Epic secure chat 7am-7pm After 7 pm, please refer to night coverage provider listed on amion.

## 2024-06-04 NOTE — TOC Progression Note (Signed)
 Transition of Care [facility]) - Progression Note    Patient Details  Name: Raymond Gordon MRN: [id number] Date of Birth: 04-29-1951  Transition of Care [facility]) CM/SW Contact  Inocente GORMAN Kindle, LCSW Phone Number: 06/04/2024, 3:18 PM  Clinical Narrative:    Insurance approval still pending for SNF. Per [employer], they are hopeful for bed to open Sunday or Monday if insurance approves.     Expected Discharge Plan: Skilled Nursing Facility Barriers to Discharge: English as a second language teacher, Continued Medical Work up  Expected Discharge Plan and Services In-house Referral: Clinical Social Work   Post Acute Care Choice: Skilled Nursing Facility Living arrangements for the past 2 months: Single Family Home                                       Social Determinants of Health (SDOH) Interventions SDOH Screenings   Food Insecurity: No Food Insecurity (06/01/2024)  Housing: Low Risk  (06/01/2024)  Transportation Needs: No Transportation Needs (06/03/2024)  Utilities: Not At Risk (06/03/2024)  Social Connections: Moderately Integrated (06/01/2024)  Tobacco Use: Medium Risk (05/04/2024)    Readmission Risk Interventions     No data to display

## 2024-06-05 DIAGNOSIS — N179 Acute kidney failure, unspecified: Secondary | ICD-10-CM | POA: Diagnosis not present

## 2024-06-05 LAB — CBC
HCT: 39.5 % (ref 39.0–52.0)
Hemoglobin: 12.4 g/dL — ABNORMAL LOW (ref 13.0–17.0)
MCH: 27.6 pg (ref 26.0–34.0)
MCHC: 31.4 g/dL (ref 30.0–36.0)
MCV: 87.8 fL (ref 80.0–100.0)
Platelets: 277 K/uL (ref 150–400)
RBC: 4.5 MIL/uL (ref 4.22–5.81)
RDW: 17.2 % — ABNORMAL HIGH (ref 11.5–15.5)
WBC: 6.5 K/uL (ref 4.0–10.5)
nRBC: 0 % (ref 0.0–0.2)

## 2024-06-05 LAB — GLUCOSE, CAPILLARY
Glucose-Capillary: 183 mg/dL — ABNORMAL HIGH (ref 70–99)
Glucose-Capillary: 192 mg/dL — ABNORMAL HIGH (ref 70–99)
Glucose-Capillary: 180 mg/dL — ABNORMAL HIGH (ref 70–99)
Glucose-Capillary: 177 mg/dL — ABNORMAL HIGH (ref 70–99)

## 2024-06-05 LAB — BASIC METABOLIC PANEL WITH GFR
Anion gap: 8 (ref 5–15)
BUN: 42 mg/dL — ABNORMAL HIGH (ref 8–23)
CO2: 22 mmol/L (ref 22–32)
Calcium: 9.9 mg/dL (ref 8.9–10.3)
Chloride: 112 mmol/L — ABNORMAL HIGH (ref 98–111)
Creatinine, Ser: 1.13 mg/dL (ref 0.61–1.24)
GFR, Estimated: >60 mL/min (ref >60)
Glucose, Bld: 150 mg/dL — ABNORMAL HIGH (ref 70–99)
Potassium: 4.4 mmol/L (ref 3.5–5.1)
Sodium: 142 mmol/L (ref 135–145)

## 2024-06-05 LAB — MAGNESIUM: Magnesium: 2.2 mg/dL (ref 1.7–2.4)

## 2024-06-05 LAB — PHOSPHORUS: Phosphorus: 2.9 mg/dL (ref 2.5–4.6)

## 2024-06-05 MED ORDER — FUROSEMIDE 40 MG PO TABS
40.0000 mg | ORAL_TABLET | Freq: Every day | ORAL | Status: DC
  Administered 2024-06-05 – 2024-06-07 (×3): 40 mg via ORAL
  Filled 2024-06-05 (×3): qty 1

## 2024-06-05 MED ORDER — HYDRALAZINE HCL 20 MG/ML IJ SOLN: 5.0000 mg | INTRAMUSCULAR | Status: DC | PRN

## 2024-06-05 MED ORDER — DOCUSATE SODIUM 100 MG PO CAPS
100.0000 mg | ORAL_CAPSULE | Freq: Two times a day (BID) | ORAL | Status: DC
  Administered 2024-06-06 – 2024-06-10 (×7): 100 mg via ORAL
  Filled 2024-06-05 (×8): qty 1

## 2024-06-05 MED ORDER — K PHOS MONO-SOD PHOS DI & MONO 155-852-130 MG PO TABS
500.0000 mg | ORAL_TABLET | Freq: Four times a day (QID) | ORAL | Status: AC
  Administered 2024-06-05 (×4): 500 mg via ORAL
  Filled 2024-06-05 (×4): qty 2

## 2024-06-05 MED ORDER — ALLOPURINOL 300 MG PO TABS
300.0000 mg | ORAL_TABLET | Freq: Every morning | ORAL | Status: DC
  Administered 2024-06-06 – 2024-06-10 (×5): 300 mg via ORAL
  Filled 2024-06-05 (×5): qty 1

## 2024-06-05 NOTE — Progress Notes (Signed)
 [facility]                                                                              Raymond Gordon, is a 73 y.o. male, DOB - 1951-01-04, FMW:[id number] Admit date - 05/31/2024    Outpatient Primary MD for the patient is [name], [name], MD  LOS - 4  days  Chief Complaint  Patient presents with   Fall       Brief summary   Patient is a 73 year old male with HTN, hyperlipidemia, DM, paroxysmal A-fib on Eliquis , chronic diastolic CHF, OSA, obesity, QT prolongation, gout, urinary retention with Foley presenting with near syncope.  Patient reported multiple falls in the past several days with lightheadedness, no syncope or hitting his head.  Reported intermittent dizziness preceding his fall.  EMS was called and systolic BP was noted to be in 80s. Patient was admitted 03/08/2024-03/12/2024 for heart failure exacerbation. Discharge weight was 127 kg at that time.  Current weight is 110 kg.  Reports chronic lower extremity edema.  Previous notes indicate possible lymphedema.  In ED, lowest BP 73/48, improved 211/43, temp 97.6 F, RR 21, pulse 62 O2 sats 94% on room air creatinine 2.66, baseline 1.2, chest x-ray showed no acute abnormality.  Assessment & Plan    AKI (acute kidney injury) (HCC) with hyperkalemia -Presented with creatinine of 2.66, potassium 5.3, metabolic acidosis - Lasix , Aldactone , losartan , metoprolol , doxazosin  held, placed on gentle hydration - AKI most likely in the setting of volume depletion/low blood pressure from her antihypertensive medications and diuretics.  - Renal function much improved, back to baseline - Potassium has normalized   Near syncope, falls - Likely due to acute kidney injury, overdiuresis, - Had a recent 2D echo 02/2024 which had shown EF of 55- 60%, left ventricular diastolic parameters, right ventricular systolic function normal - Improving with IV fluids - PT/OT consulted - Patient is orthostatic , will hold on restarting  Lasix  and Aldactone  - Patient orthostatic yesterday, will recheck again today, but clinically he is much improved after TED hose    Essential hypertension  -Continue to hold Lasix , Aldactone , losartan , metoprolol , doxazosin  and Imdur . - Patient on multiple medications, started by different providers. - Continue to hold Cardura  which was ordered by his PCP at [facility] for high blood pressure and BPH. - Continue to hold Imdur  as unclear who started patient on this med. - He will be started on low-dose metoprolol  specially with a known history of A-fib. -He is started on losartan  given history of diabetes. - Remains orthostatic so we will hold on resuming Lasix  and Aldactone . - Discontinue Cardura  altogether as patient is on Proscar  by his urologist. -Is not anymore on Flomax     Chronic diastolic CHF - 2D echo 02/2024 had shown EF of 55- 60%, left ventricular diastolic parameters, right ventricular systolic function normal - Patient was discharged on Lasix  80 mg twice daily, Imdur , losartan , doxazosin , metoprolol , currently on hold -  Discharge weight was 127 kg, weight on admission 110 kg.  Suspect patient is volume down leading to AKI as above. - Appears to be euvolemic today, will start on low-dose  Lasix .    UTI -UA positive for UTI, follow urine culture and sensitivities, - Urine culture growing Serratia, sensitive to Rocephin , will continue.   Diabetes mellitus type 2, IDDM hyperglycemia Hemoglobin A1c 7.9 on 03/08/2024 CBG (last 3)  Recent Labs    06/04/24 2124 06/05/24 0841 06/05/24 1149  GLUCAP 182* 183* 192*   Continue sliding scale insulin , increase Lantus  to 10 units at bedtime  (outpatient on 15 units nightly)   Paroxysmal atrial fibrillation - Holding home metoprolol  as above, will resume at low-dose metoprolol  to avoid RVR. - Continue home Eliquis  -Will discontinue aspirin    OSA - Continue home CPAP   History of QT prolongation - Avoid QT prolonging  meds, correct electrolyte abnormalities   Gout - Holding allopurinol  in the setting of AKI, will resume allopurinol    Urinary retention - Continue home finasteride  (finasteride  prescribed by his urologist Dr. Devere, he was prescribed Cardura  by his primary care physician which has been discontinued, and he is not anymore on Flomax ) - Foley in place, inserted last April, and was exchanged most recently on June 30, but now he has UTI it needs to be replaced, so actually I will do a voiding trial for him Foley catheter was discharged yesterday, so far he is able to urinate with no retention, so we will continue to monitor without reinserting Foley catheter.   Obesity class II Estimated body mass index is 42.76 kg/m as calculated from the following:   Height as of this encounter: 5' 4 (1.626 m).   Weight as of this encounter: 113 kg.  Code Status: Full CODE STATUS DVT Prophylaxis:  Place and maintain sequential compression device Start: 06/03/24 1123 Place TED hose Start: 06/03/24 1123 apixaban  (ELIQUIS ) tablet 5 mg   Level of Care: Level of care: Progressive Family Communication: Discussed with sister at bedside 7/10, at bedside today Disposition Plan:      SNF versus home home health  Procedures:    Consultants:   None  Antimicrobials:   Anti-infectives (From admission, onward)    Start     Dose/Rate Route Frequency Ordered Stop   06/01/24 1000  cefTRIAXone  (ROCEPHIN ) 1 g in sodium chloride  0.9 % 100 mL IVPB        1 g 200 mL/hr over 30 Minutes Intravenous Every 24 hours 06/01/24 0947            Medications  apixaban   5 mg Oral BID   Chlorhexidine  Gluconate Cloth  6 each Topical Daily   finasteride   5 mg Oral QPM   furosemide   40 mg Oral Daily   insulin  aspart  0-9 Units Subcutaneous TID WC   insulin  glargine-yfgn  10 Units Subcutaneous QHS   losartan   100 mg Oral Daily   metoprolol  tartrate  12.5 mg Oral BID   pantoprazole   40 mg Oral Daily   phosphorus  500  mg Oral QID   sodium chloride  flush  3 mL Intravenous Q12H      Subjective:   Raymond Gordon patient still able to urinate, no evidence of retention. Objective:   Vitals:   06/05/24 0033 06/05/24 0500 06/05/24 0837 06/05/24 1145  BP: (!) 143/41  (!) 135/42 (!) 125/46  Pulse:   (!) 59 (!) 50  Resp:   19 15  Temp: 98.5 F (36.9 C)  97.8 F (36.6 C) (!) 97.3 F (36.3 C)  TempSrc: Oral  Oral Oral  SpO2:   96% 95%  Weight:  113 kg    Height:  Intake/Output Summary (Last 24 hours) at 06/05/2024 1434 Last data filed at 06/05/2024 1430 Gross per 24 hour  Intake --  Output 1100 ml  Net -1100 ml     Wt Readings from Last 3 Encounters:  06/05/24 113 kg  03/31/24 120.3 kg  03/12/24 128.5 kg     Exam Awake Alert, Oriented X 3, No new F.N deficits, Normal affect Symmetrical Chest wall movement, Good air movement bilaterally, CTAB RRR,No Gallops,Rubs or new Murmurs, No Parasternal Heave +ve B.Sounds, Abd Soft, No tenderness, No rebound - guarding or rigidity Wearing a TED hose     Data Reviewed:  I have personally reviewed following labs    CBC Lab Results  Component Value Date   WBC 6.5 06/05/2024   RBC 4.50 06/05/2024   HGB 12.4 (L) 06/05/2024   HCT 39.5 06/05/2024   MCV 87.8 06/05/2024   MCH 27.6 06/05/2024   PLT 277 06/05/2024   MCHC 31.4 06/05/2024   RDW 17.2 (H) 06/05/2024   LYMPHSABS 1.3 05/31/2024   MONOABS 0.5 05/31/2024   EOSABS 0.1 05/31/2024   BASOSABS 0.0 05/31/2024     Last metabolic panel Lab Results  Component Value Date   NA 142 06/05/2024   K 4.4 06/05/2024   CL 112 (H) 06/05/2024   CO2 22 06/05/2024   BUN 42 (H) 06/05/2024   CREATININE 1.13 06/05/2024   GLUCOSE 150 (H) 06/05/2024   GFRNONAA >60 06/05/2024   GFRAA >60 08/21/2015   CALCIUM  9.9 06/05/2024   PHOS 2.9 06/05/2024   PROT 7.5 06/01/2024   ALBUMIN 3.0 (L) 06/04/2024   LABGLOB 3.1 11/02/2021   AGRATIO 1.5 11/02/2021   BILITOT 0.5 06/01/2024   ALKPHOS 76  06/01/2024   AST 17 06/01/2024   ALT 21 06/01/2024   ANIONGAP 8 06/05/2024    CBG (last 3)  Recent Labs    06/04/24 2124 06/05/24 0841 06/05/24 1149  GLUCAP 182* 183* 192*      Coagulation Profile: No results for input(s): INR, PROTIME in the last 168 hours.   Radiology Studies: I have personally reviewed the imaging studies  No results found.      Brayton Lye M.D. [facility] 06/05/2024, 2:34 PM  Available via Epic secure chat 7am-7pm After 7 pm, please refer to night coverage provider listed on amion.

## 2024-06-05 NOTE — Progress Notes (Signed)
 Patient had 12 beats of SVT HR 140s @ 1544. Asymptomatic.   HR 53 now. Dr Sherlon informed. No new orders

## 2024-06-05 NOTE — Plan of Care (Signed)
  Problem: Coping: Goal: Ability to adjust to condition or change in health will improve Outcome: Progressing   Problem: Fluid Volume: Goal: Ability to maintain a balanced intake and output will improve Outcome: Progressing   Problem: Metabolic: Goal: Ability to maintain appropriate glucose levels will improve Outcome: Progressing   Problem: Skin Integrity: Goal: Risk for impaired skin integrity will decrease Outcome: Progressing   Problem: Clinical Measurements: Goal: Ability to maintain clinical measurements within normal limits will improve Outcome: Progressing

## 2024-06-05 NOTE — Plan of Care (Signed)
 Pt has rested quietly throughout the night with no distress noted. Alert and oriented. On room air. SB/SR on the monitor. Purewick intact to suction. Pt voiding. No complaints voiced.     Problem: Education: Goal: Knowledge of General Education information will improve Description: Including pain rating scale, medication(s)/side effects and non-pharmacologic comfort measures Outcome: Progressing   Problem: Clinical Measurements: Goal: Respiratory complications will improve Outcome: Progressing Goal: Cardiovascular complication will be avoided Outcome: Progressing   Problem: Activity: Goal: Risk for activity intolerance will decrease Outcome: Progressing   Problem: Pain Managment: Goal: General experience of comfort will improve and/or be controlled Outcome: Progressing

## 2024-06-05 NOTE — Progress Notes (Signed)
 Mobility Specialist Progress Note:   06/05/24 1500  Mobility  Activity Ambulated with assistance in room  Level of Assistance Minimal assist, patient does 75% or more  Assistive Device Four wheel walker  Distance Ambulated (ft) 10 ft  Activity Response Tolerated fair  Mobility Referral Yes  Mobility visit 1 Mobility  Mobility Specialist Start Time (ACUTE ONLY) 1440  Mobility Specialist Stop Time (ACUTE ONLY) 1500  Mobility Specialist Time Calculation (min) (ACUTE ONLY) 20 min   Pt agreeable to mobility session. Required minA to stand from recliner, CGA to take a few steps forward and backward x2. Pt uncomfortable going away from chair at this time. BP 120s/50s. Minor dizziness reported. Pt back in chair with all needs met.   Therisa Rana Mobility Specialist Please contact via SecureChat or  Rehab office at [phone]

## 2024-06-05 NOTE — Progress Notes (Signed)
 [facility] night cross cover note:   I was notified by the patient's RN that the patient was experiencing the urge to void, but unable to pass urine spontaneously.  He attempted to have a bowel movement at the time, but was also unsuccessful in moving his bowels, with RN conveying that the patient's most recent bowel movement occurred yesterday.  He currently has an existing order for prn MiraLAX  to which I have ordered scheduled Colace.  At the time the patient was straining for urination/bowel movement, systolic blood pressure noted to elevate to the low 200's mmHg.  RN conveys that the patient has a history of urinary retention, and that his Foley catheter was removed on Thursday, 06/03/2024, around noon.  Since removal of Foley catheter, RN conveys that the patient has been able to void spontaneously leading up to the above difficulty starting this evening.  Ensuing bladder scan showed 545 cc's. I ordered straight cath x 1, which produced a 600 cc's of urine following which the patient conveyed that he felt much more comfortable, and corresponding systolic blood pressures were noted to improve into the 150's mmHg.     Eva Pore, DO Hospitalist

## 2024-06-05 NOTE — TOC Progression Note (Addendum)
 Transition of Care [facility] - Progression Note    Patient Details  Name: Raymond Gordon MRN: [id number] Date of Birth: [birth year]  Transition of Care [facility]) CM/SW Contact  Inocente GORMAN Kindle, LCSW Phone Number: 06/05/2024, 10:57 AM  Clinical Narrative:    10:57am-Peer to Peer requested by [employer]. CSW provided Dr. Ernestine contact info to MD.   11:29 AM-MD completed Peer to Peer. CSW spoke with Crystal at [facility] who states Peer to Peer is denied due to need for custodial care. [facility] transport request was approved, # [id number]. They will fax denial letter to [facility] department fax to provide to family.   1:22 PM-CSW spoke with patient's sister, [name], and provided update. She is hopeful to have a few days to prepare things for patient at home while she follow up on a Medicaid application. She asked about hospital appeal process and CSW answered questions. Patient will need home health services. He has all needed DME at home.   Expected Discharge Plan: Skilled Nursing Facility Barriers to Discharge: English as a second language teacher, Continued Medical Work up  Expected Discharge Plan and Services In-house Referral: Clinical Social Work   Post Acute Care Choice: Skilled Nursing Facility Living arrangements for the past 2 months: Single Family Home                                       Social Determinants of Health (SDOH) Interventions SDOH Screenings   Food Insecurity: No Food Insecurity (06/01/2024)  Housing: Low Risk  (06/01/2024)  Transportation Needs: No Transportation Needs (06/03/2024)  Utilities: Not At Risk (06/03/2024)  Social Connections: Moderately Integrated (06/01/2024)  Tobacco Use: Medium Risk (05/04/2024)    Readmission Risk Interventions     No data to display

## 2024-06-06 DIAGNOSIS — N179 Acute kidney failure, unspecified: Secondary | ICD-10-CM | POA: Diagnosis not present

## 2024-06-06 LAB — GLUCOSE, CAPILLARY
Glucose-Capillary: 165 mg/dL — ABNORMAL HIGH (ref 70–99)
Glucose-Capillary: 140 mg/dL — ABNORMAL HIGH (ref 70–99)
Glucose-Capillary: 157 mg/dL — ABNORMAL HIGH (ref 70–99)
Glucose-Capillary: 157 mg/dL — ABNORMAL HIGH (ref 70–99)

## 2024-06-06 NOTE — Progress Notes (Signed)
   06/06/24 2240  BiPAP/CPAP/SIPAP  BiPAP/CPAP/SIPAP Pt Type Adult  BiPAP/CPAP/SIPAP DREAMSTATIOND  Mask Type Full face mask  Mask Size Large  Respiratory Rate 18 breaths/min  IPAP 15 cmH20  EPAP 6 cmH2O  FiO2 (%) 21 %  Patient Home Machine No  Patient Home Mask No  Patient Home Tubing No  Auto Titrate No  CPAP/SIPAP surface wiped down Yes  Device Plugged into RED Power Outlet Yes  BiPAP/CPAP /SiPAP Vitals  Pulse Rate (!) 48  Resp 14  SpO2 96 %  MEWS Score/Color  MEWS Score 1  MEWS Score Color Landy

## 2024-06-06 NOTE — Progress Notes (Signed)
 [facility]                                                                              Raymond Gordon, is a 73 y.o. male, DOB - 20-May-1951, FMW:[id number] Admit date - 05/31/2024    Outpatient Primary MD for the patient is [name], [name], MD  LOS - 5  days  Chief Complaint  Patient presents with   Fall       Brief summary   Patient is a 73 year old male with HTN, hyperlipidemia, DM, paroxysmal A-fib on Eliquis , chronic diastolic CHF, OSA, obesity, QT prolongation, gout, urinary retention with Foley presenting with near syncope.  Patient reported multiple falls in the past several days with lightheadedness, no syncope or hitting his head.  Reported intermittent dizziness preceding his fall.  EMS was called and systolic BP was noted to be in 80s. Patient was admitted 03/08/2024-03/12/2024 for heart failure exacerbation. Discharge weight was 127 kg at that time.  Current weight is 110 kg.  Reports chronic lower extremity edema.  Previous notes indicate possible lymphedema.  In ED, lowest BP 73/48, improved 211/43, temp 97.6 F, RR 21, pulse 62 O2 sats 94% on room air creatinine 2.66, baseline 1.2, chest x-ray showed no acute abnormality.  Assessment & Plan    AKI (acute kidney injury) (HCC) with hyperkalemia -Presented with creatinine of 2.66, potassium 5.3, metabolic acidosis - Lasix , Aldactone , losartan , metoprolol , doxazosin  held, placed on gentle hydration - AKI most likely in the setting of volume depletion/low blood pressure from her antihypertensive medications and diuretics.  - Renal function much improved, back to baseline - Potassium has normalized   Near syncope, falls - Likely due to acute kidney injury, overdiuresis, - Had a recent 2D echo 02/2024 which had shown EF of 55- 60%, left ventricular diastolic parameters, right ventricular systolic function normal - Improving with IV fluids - PT/OT consulted - Patient is orthostatic , will hold on restarting  Lasix  and Aldactone  - Patient orthostatic yesterday, will recheck again today, but clinically he is much improved after TED hose    Essential hypertension  -Continue to hold Lasix , Aldactone , losartan , metoprolol , doxazosin  and Imdur . - Patient on multiple medications, started by different providers. - Continue to hold Cardura  which was ordered by his PCP at [facility] for high blood pressure and BPH. - Continue to hold Imdur  as unclear who started patient on this med. - He will be started on low-dose metoprolol  specially with a known history of A-fib. -He is started on losartan  given history of diabetes. - Remains orthostatic so we will hold on resuming Lasix  and Aldactone . - Discontinue Cardura  altogether as patient is on Proscar  by his urologist. -Is not anymore on Flomax     Chronic diastolic CHF - 2D echo 02/2024 had shown EF of 55- 60%, left ventricular diastolic parameters, right ventricular systolic function normal - Patient was discharged on Lasix  80 mg twice daily, Imdur , losartan , doxazosin , metoprolol , currently on hold -  Discharge weight was 127 kg, weight on admission 110 kg.  Suspect patient is volume down leading to AKI as above. - Appears to be euvolemic today, will start on low-dose  Lasix .    UTI -UA positive for UTI, follow urine culture and sensitivities, - Urine culture growing Serratia, sensitive to Rocephin , will continue.   Diabetes mellitus type 2, IDDM hyperglycemia Hemoglobin A1c 7.9 on 03/08/2024 CBG (last 3)  Recent Labs    06/05/24 2046 06/06/24 0805 06/06/24 1207  GLUCAP 177* 165* 140*   Continue sliding scale insulin , increase Lantus  to 10 units at bedtime  (outpatient on 15 units nightly)   Paroxysmal atrial fibrillation - Holding home metoprolol  as above, will resume at low-dose metoprolol  to avoid RVR. - Continue home Eliquis  -Will discontinue aspirin    OSA - Continue home CPAP   History of QT prolongation - Avoid QT prolonging  meds, correct electrolyte abnormalities   Gout - Holding allopurinol  in the setting of AKI, will resume allopurinol    Urinary retention - Continue home finasteride  (finasteride  prescribed by his urologist Dr. Devere, he was prescribed Cardura  by his primary care physician which has been discontinued, and he is not anymore on Flomax ) - Foley in place, inserted last April, and was exchanged most recently on June 30, but now he has UTI it needs to be replaced, we actually did a voiding trial, but unfortunately over the last 48 hours he is retaining significantly requiring In-N-Out, so we will reinsert Foley catheter.     Obesity class II Estimated body mass index is 39.62 kg/m as calculated from the following:   Height as of this encounter: 5' 4 (1.626 m).   Weight as of this encounter: 104.7 kg.  Code Status: Full CODE STATUS DVT Prophylaxis:  Place and maintain sequential compression device Start: 06/03/24 1123 Place TED hose Start: 06/03/24 1123 apixaban  (ELIQUIS ) tablet 5 mg   Level of Care: Level of care: Progressive Family Communication: Discussed with sister at bedside 7/10, and by phone on 7/13 Disposition Plan:      SNF versus home home health  Procedures:    Consultants:   None  Antimicrobials:   Anti-infectives (From admission, onward)    Start     Dose/Rate Route Frequency Ordered Stop   06/01/24 1000  cefTRIAXone  (ROCEPHIN ) 1 g in sodium chloride  0.9 % 100 mL IVPB        1 g 200 mL/hr over 30 Minutes Intravenous Every 24 hours 06/01/24 0947            Medications  allopurinol   300 mg Oral q AM   apixaban   5 mg Oral BID   Chlorhexidine  Gluconate Cloth  6 each Topical Daily   docusate sodium   100 mg Oral BID   finasteride   5 mg Oral QPM   furosemide   40 mg Oral Daily   insulin  aspart  0-9 Units Subcutaneous TID WC   insulin  glargine-yfgn  10 Units Subcutaneous QHS   losartan   100 mg Oral Daily   metoprolol  tartrate  12.5 mg Oral BID   pantoprazole   40  mg Oral Daily   sodium chloride  flush  3 mL Intravenous Q12H      Subjective:   [name] patient still able to urinate, no evidence of retention. Objective:   Vitals:   06/06/24 0400 06/06/24 0500 06/06/24 0753 06/06/24 1201  BP: (!) 128/45  (!) 166/49 (!) 116/51  Pulse: (!) 47  (!) 52 (!) 49  Resp: 12 18 14 17   Temp: 97.9 F (36.6 C)  98.2 F (36.8 C) 98 F (36.7 C)  TempSrc: Oral  Oral Axillary  SpO2: 98%  95% 97%  Weight:  104.7 kg  Height:        Intake/Output Summary (Last 24 hours) at 06/06/2024 1321 Last data filed at 06/05/2024 2315 Gross per 24 hour  Intake 240 ml  Output 1150 ml  Net -910 ml     Wt Readings from Last 3 Encounters:  06/06/24 104.7 kg  03/31/24 120.3 kg  03/12/24 128.5 kg     Exam  Awake Alert, Oriented X 3, No new F.N deficits, Normal affect Symmetrical Chest wall movement, Good air movement bilaterally, CTAB RRR,No Gallops,Rubs or new Murmurs, No Parasternal Heave +ve B.Sounds, Abd Soft, No tenderness, No rebound - guarding or rigidity. No Cyanosis, Clubbing or edema, wearing TED hose.     Data Reviewed:  I have personally reviewed following labs    CBC Lab Results  Component Value Date   WBC 6.5 06/05/2024   RBC 4.50 06/05/2024   HGB 12.4 (L) 06/05/2024   HCT 39.5 06/05/2024   MCV 87.8 06/05/2024   MCH 27.6 06/05/2024   PLT 277 06/05/2024   MCHC 31.4 06/05/2024   RDW 17.2 (H) 06/05/2024   LYMPHSABS 1.3 05/31/2024   MONOABS 0.5 05/31/2024   EOSABS 0.1 05/31/2024   BASOSABS 0.0 05/31/2024     Last metabolic panel Lab Results  Component Value Date   NA 142 06/05/2024   K 4.4 06/05/2024   CL 112 (H) 06/05/2024   CO2 22 06/05/2024   BUN 42 (H) 06/05/2024   CREATININE 1.13 06/05/2024   GLUCOSE 150 (H) 06/05/2024   GFRNONAA >60 06/05/2024   GFRAA >60 08/21/2015   CALCIUM  9.9 06/05/2024   PHOS 2.9 06/05/2024   PROT 7.5 06/01/2024   ALBUMIN 3.0 (L) 06/04/2024   LABGLOB 3.1 11/02/2021   AGRATIO 1.5  11/02/2021   BILITOT 0.5 06/01/2024   ALKPHOS 76 06/01/2024   AST 17 06/01/2024   ALT 21 06/01/2024   ANIONGAP 8 06/05/2024    CBG (last 3)  Recent Labs    06/05/24 2046 06/06/24 0805 06/06/24 1207  GLUCAP 177* 165* 140*      Coagulation Profile: No results for input(s): INR, PROTIME in the last 168 hours.   Radiology Studies: I have personally reviewed the imaging studies  No results found.      Brayton Lye M.D. [facility] 06/06/2024, 1:21 PM  Available via Epic secure chat 7am-7pm After 7 pm, please refer to night coverage provider listed on amion.

## 2024-06-06 NOTE — Plan of Care (Signed)

## 2024-06-06 NOTE — Plan of Care (Addendum)
 Pt has rested quietly throughout the night with no distress noted after in and out cath done. Alert and oriented. ON room air. SR/SB on the monitor. Purewick on to suction. At 2245 Pt called out and said he was having trouble breathing. Upon entering room the pt stated he had been trying to pee and was unable to and felt like he had to have a BM and it was making him SOA. Got pt up to [facility]. DID not have a BM. Placed back in bed and pt's bladder distended. Bladder scan in the 500's. MD aware. In and out cath done with 600cc urine. BP in the 200's when he called out. But after cath his pressure down to 150's. Pt slept rest of night. No further complaints voiced.     Problem: Tissue Perfusion: Goal: Adequacy of tissue perfusion will improve Outcome: Progressing   Problem: Education: Goal: Knowledge of General Education information will improve Description: Including pain rating scale, medication(s)/side effects and non-pharmacologic comfort measures Outcome: Progressing   Problem: Clinical Measurements: Goal: Respiratory complications will improve Outcome: Progressing Goal: Cardiovascular complication will be avoided Outcome: Progressing   Problem: Pain Managment: Goal: General experience of comfort will improve and/or be controlled Outcome: Progressing

## 2024-06-07 DIAGNOSIS — N401 Enlarged prostate with lower urinary tract symptoms: Secondary | ICD-10-CM | POA: Diagnosis not present

## 2024-06-07 DIAGNOSIS — Z466 Encounter for fitting and adjustment of urinary device: Secondary | ICD-10-CM | POA: Diagnosis not present

## 2024-06-07 DIAGNOSIS — I4891 Unspecified atrial fibrillation: Secondary | ICD-10-CM | POA: Diagnosis not present

## 2024-06-07 DIAGNOSIS — D631 Anemia in chronic kidney disease: Secondary | ICD-10-CM | POA: Diagnosis not present

## 2024-06-07 DIAGNOSIS — E785 Hyperlipidemia, unspecified: Secondary | ICD-10-CM | POA: Diagnosis not present

## 2024-06-07 DIAGNOSIS — F0390 Unspecified dementia without behavioral disturbance: Secondary | ICD-10-CM | POA: Diagnosis not present

## 2024-06-07 DIAGNOSIS — N179 Acute kidney failure, unspecified: Secondary | ICD-10-CM | POA: Diagnosis not present

## 2024-06-07 DIAGNOSIS — G4730 Sleep apnea, unspecified: Secondary | ICD-10-CM | POA: Diagnosis not present

## 2024-06-07 DIAGNOSIS — R338 Other retention of urine: Secondary | ICD-10-CM | POA: Diagnosis not present

## 2024-06-07 DIAGNOSIS — N189 Chronic kidney disease, unspecified: Secondary | ICD-10-CM | POA: Diagnosis not present

## 2024-06-07 DIAGNOSIS — I130 Hypertensive heart and chronic kidney disease with heart failure and stage 1 through stage 4 chronic kidney disease, or unspecified chronic kidney disease: Secondary | ICD-10-CM | POA: Diagnosis not present

## 2024-06-07 DIAGNOSIS — I5032 Chronic diastolic (congestive) heart failure: Secondary | ICD-10-CM | POA: Diagnosis not present

## 2024-06-07 DIAGNOSIS — E1165 Type 2 diabetes mellitus with hyperglycemia: Secondary | ICD-10-CM | POA: Diagnosis not present

## 2024-06-07 LAB — GLUCOSE, CAPILLARY
Glucose-Capillary: 121 mg/dL — ABNORMAL HIGH (ref 70–99)
Glucose-Capillary: 175 mg/dL — ABNORMAL HIGH (ref 70–99)
Glucose-Capillary: 164 mg/dL — ABNORMAL HIGH (ref 70–99)
Glucose-Capillary: 150 mg/dL — ABNORMAL HIGH (ref 70–99)

## 2024-06-07 LAB — BASIC METABOLIC PANEL WITH GFR
Anion gap: 15 (ref 5–15)
BUN: 39 mg/dL — ABNORMAL HIGH (ref 8–23)
CO2: 20 mmol/L — ABNORMAL LOW (ref 22–32)
Calcium: 9.4 mg/dL (ref 8.9–10.3)
Chloride: 105 mmol/L (ref 98–111)
Creatinine, Ser: 1.28 mg/dL — ABNORMAL HIGH (ref 0.61–1.24)
GFR, Estimated: 59 mL/min — ABNORMAL LOW (ref >60)
Glucose, Bld: 182 mg/dL — ABNORMAL HIGH (ref 70–99)
Potassium: 4.5 mmol/L (ref 3.5–5.1)
Sodium: 140 mmol/L (ref 135–145)

## 2024-06-07 LAB — CBC
HCT: 41.3 % (ref 39.0–52.0)
Hemoglobin: 12.8 g/dL — ABNORMAL LOW (ref 13.0–17.0)
MCH: 27.8 pg (ref 26.0–34.0)
MCHC: 31 g/dL (ref 30.0–36.0)
MCV: 89.6 fL (ref 80.0–100.0)
Platelets: 235 K/uL (ref 150–400)
RBC: 4.61 MIL/uL (ref 4.22–5.81)
RDW: 17.5 % — ABNORMAL HIGH (ref 11.5–15.5)
WBC: 6.3 K/uL (ref 4.0–10.5)
nRBC: 0 % (ref 0.0–0.2)

## 2024-06-07 LAB — MAGNESIUM: Magnesium: 2.1 mg/dL (ref 1.7–2.4)

## 2024-06-07 LAB — PHOSPHORUS: Phosphorus: 3.3 mg/dL (ref 2.5–4.6)

## 2024-06-07 MED ORDER — PANTOPRAZOLE SODIUM 40 MG PO TBEC
40.0000 mg | DELAYED_RELEASE_TABLET | Freq: Every day | ORAL | 0 refills | Status: DC
Start: 2024-06-08 — End: 2024-06-10

## 2024-06-07 MED ORDER — FUROSEMIDE 40 MG PO TABS: 60.0000 mg | ORAL_TABLET | Freq: Every day | ORAL | 0 refills | Status: DC

## 2024-06-07 MED ORDER — ACETAMINOPHEN 325 MG PO TABS: 650.0000 mg | ORAL_TABLET | Freq: Four times a day (QID) | ORAL | Status: AC | PRN

## 2024-06-07 MED ORDER — BISACODYL 5 MG PO TBEC
10.0000 mg | DELAYED_RELEASE_TABLET | Freq: Once | ORAL | Status: AC
  Administered 2024-06-07: 10 mg via ORAL
  Filled 2024-06-07: qty 2

## 2024-06-07 MED ORDER — METOPROLOL TARTRATE 25 MG PO TABS: 12.5000 mg | ORAL_TABLET | Freq: Two times a day (BID) | ORAL | 0 refills | Status: DC

## 2024-06-07 NOTE — Care Management Important Message (Signed)
 Important Message  Patient Details  Name: Raymond Gordon MRN: [id number] Date of Birth: 1951/07/17   Important Message Given:  Yes - Medicare IM     Claretta Deed 06/07/2024, 3:56 PM

## 2024-06-07 NOTE — Discharge Instructions (Signed)
 Follow with Primary MD [name], [name], MD in 7 days   Get CBC, CMP,  checked  by Primary MD next visit.    Activity: As tolerated with Full fall precautions use walker/cane & assistance as needed   Disposition Home    Diet: Heart Healthy   For Heart failure patients - Check your Weight same time everyday, if you gain over 2 pounds, or you develop in leg swelling, experience more shortness of breath or chest pain, call your Primary MD immediately. Follow Cardiac Low Salt Diet and 1.5 lit/day fluid restriction.   On your next visit with your primary care physician please Get Medicines reviewed and adjusted.   Please request your Prim.MD to go over all Hospital Tests and Procedure/Radiological results at the follow up, please get all Hospital records sent to your Prim MD by signing hospital release before you go home.   If you experience worsening of your admission symptoms, develop shortness of breath, life threatening emergency, suicidal or homicidal thoughts you must seek medical attention immediately by calling 911 or calling your MD immediately  if symptoms less severe.  You Must read complete instructions/literature along with all the possible adverse reactions/side effects for all the Medicines you take and that have been prescribed to you. Take any new Medicines after you have completely understood and accpet all the possible adverse reactions/side effects.   Do not drive, operating heavy machinery, perform activities at heights, swimming or participation in water activities or provide baby sitting services if your were admitted for syncope or siezures until you have seen by Primary MD or a Neurologist and advised to do so again.  Do not drive when taking Pain medications.    Do not take more than prescribed Pain, Sleep and Anxiety Medications  Special Instructions: If you have smoked or chewed Tobacco  in the last 2 yrs please stop smoking, stop any regular Alcohol   and or  any Recreational drug use.  Wear Seat belts while driving.   Please note  You were cared for by a hospitalist during your hospital stay. If you have any questions about your discharge medications or the care you received while you were in the hospital after you are discharged, you can call the unit and asked to speak with the hospitalist on call if the hospitalist that took care of you is not available. Once you are discharged, your primary care physician will handle any further medical issues. Please note that NO REFILLS for any discharge medications will be authorized once you are discharged, as it is imperative that you return to your primary care physician (or establish a relationship with a primary care physician if you do not have one) for your aftercare needs so that they can reassess your need for medications and monitor your lab values.

## 2024-06-07 NOTE — Progress Notes (Addendum)
 Physical Therapy Treatment Patient Details Name: Raymond Gordon MRN: [id number] DOB: 28-Dec-1950 Today's Date: 06/07/2024   History of Present Illness Patient is a 73 year old male presenting with near syncope. PMH:  UTI, HTN, hyperlipidemia, DM, paroxysmal A-fib on Eliquis , chronic diastolic CHF, OSA, obesity, QT prolongation, gout, urinary retention with Foley    PT Comments  Pt received up on bedside commode and agreeable to participate. BP sitting 86/66 (73), standing 75/63 (69). Performed stand pivot back to recliner to don thigh high TED hose. Opted to walk at this point with close chair follow as pt asymptomatic. After short walk, ~35 ft with Rollator, pt requesting to sit. BP 91/36 (52). Gait trial terminated and wheeled pt back into room. RN/MD notified. Will continue to follow acutely and progress as tolerated.   If plan is discharge home, recommend the following: A little help with bathing/dressing/bathroom;Assistance with cooking/housework;Direct supervision/assist for medications management;Direct supervision/assist for financial management;Assist for transportation;Help with stairs or ramp for entrance;Supervision due to cognitive status;A little help with walking and/or transfers   Can travel by private vehicle     No  Equipment Recommendations  None recommended by PT    Recommendations for Other Services       Precautions / Restrictions Precautions Precautions: Fall Recall of Precautions/Restrictions: Intact Restrictions Weight Bearing Restrictions Per Provider Order: No     Mobility  Bed Mobility               General bed mobility comments: OOB in chair    Transfers Overall transfer level: Needs assistance Equipment used: Rollator (4 wheels) Transfers: Sit to/from Stand Sit to Stand: Contact guard assist           General transfer comment: Increased time/effort, no physical assist. Decreased anterior translation    Ambulation/Gait Ambulation/Gait  assistance: +2 safety/equipment, Contact guard assist Gait Distance (Feet): 35 Feet Assistive device: Rollator (4 wheels) Gait Pattern/deviations: Decreased stride length, Step-through pattern, Shuffle Gait velocity: decreased Gait velocity interpretation: <1.31 ft/sec, indicative of household ambulator   General Gait Details: Verbal cues for increased foot clearance and step length. Chair follow utilized   Comptroller Bed    Modified Rankin (Stroke Patients Only)       Balance Overall balance assessment: Needs assistance Sitting-balance support: No upper extremity supported, Feet supported Sitting balance-Leahy Scale: Fair     Standing balance support: Bilateral upper extremity supported Standing balance-Leahy Scale: Poor                              Communication Communication Communication: No apparent difficulties Factors Affecting Communication: Reduced clarity of speech  Cognition Arousal: Alert Behavior During Therapy: WFL for tasks assessed/performed   PT - Cognitive impairments: No apparent impairments                         Following commands: Intact      Cueing Cueing Techniques: Verbal cues, Tactile cues  Exercises      General Comments        Pertinent Vitals/Pain Pain Assessment Pain Assessment: Faces Faces Pain Scale: No hurt    Home Living                          Prior Function  PT Goals (current goals can now be found in the care plan section) Acute Rehab PT Goals Potential to Achieve Goals: Good Progress towards PT goals: Progressing toward goals    Frequency    Min 2X/week      PT Plan      Co-evaluation              AM-PAC PT 6 Clicks Mobility   Outcome Measure  Help needed turning from your back to your side while in a flat bed without using bedrails?: A Little Help needed moving from lying on your back to sitting on  the side of a flat bed without using bedrails?: A Little Help needed moving to and from a bed to a chair (including a wheelchair)?: A Little Help needed standing up from a chair using your arms (e.g., wheelchair or bedside chair)?: A Little Help needed to walk in hospital room?: A Lot Help needed climbing 3-5 steps with a railing? : Total 6 Click Score: 15    End of Session Equipment Utilized During Treatment: Gait belt Activity Tolerance: Patient tolerated treatment well Patient left: with call bell/phone within reach;in chair Nurse Communication: Mobility status (BP) PT Visit Diagnosis: Other abnormalities of gait and mobility (R26.89)     Time: 8894-8863 PT Time Calculation (min) (ACUTE ONLY): 31 min  Charges:    $Therapeutic Activity: 23-37 mins PT General Charges $$ ACUTE PT VISIT: 1 Visit                     Raymond Gordon, PT, DPT Acute Rehabilitation Services Office [phone]    Raymond Gordon 06/07/2024, 12:42 PM

## 2024-06-07 NOTE — Progress Notes (Signed)
 [facility]                                                                              Raymond Gordon, is a 73 y.o. male, DOB - Aug 15, 1951, FMW:[id number] Admit date - 05/31/2024    Outpatient Primary MD for the patient is [name], [name], MD  LOS - 6  days  Chief Complaint  Patient presents with   Fall       Brief summary   Patient is a 73 year old male with HTN, hyperlipidemia, DM, paroxysmal A-fib on Eliquis , chronic diastolic CHF, OSA, obesity, QT prolongation, gout, urinary retention with Foley presenting with near syncope.  Patient reported multiple falls in the past several days with lightheadedness, no syncope or hitting his head.  Reported intermittent dizziness preceding his fall.  EMS was called and systolic BP was noted to be in 80s. Patient was admitted 03/08/2024-03/12/2024 for heart failure exacerbation. Discharge weight was 127 kg at that time.  Current weight is 110 kg.  Reports chronic lower extremity edema.  Previous notes indicate possible lymphedema.  In ED, lowest BP 73/48, improved 211/43, temp 97.6 F, RR 21, pulse 62 O2 sats 94% on room air creatinine 2.66, baseline 1.2, chest x-ray showed no acute abnormality.  Assessment & Plan    AKI (acute kidney injury) (HCC) with hyperkalemia -Presented with creatinine of 2.66, potassium 5.3, metabolic acidosis - Lasix , Aldactone , losartan , metoprolol , doxazosin  held, placed on gentle hydration - AKI most likely in the setting of volume depletion/low blood pressure from her antihypertensive medications and diuretics.  - Renal function much improved, back to baseline - Potassium has normalized   Near syncope, falls - Likely due to acute kidney injury, overdiuresis, - Had a recent 2D echo 02/2024 which had shown EF of 55- 60%, left ventricular diastolic parameters, right ventricular systolic function normal - Improving with IV fluids - PT/OT consulted - Patient is orthostatic , will hold on restarting  Lasix  and Aldactone  - Patient was significantly orthostatic with PT today, drop to 75/63 upon standing, but he was not wearing his TED hose, so I will discontinue his losartan , and Lasix , he was on metoprolol  mainly for known A-fib, but overall heart rate is controlled, maintaining sinus rhythm in the 50s so I will discontinue as well.      Essential hypertension  -Continue to hold Lasix , Aldactone , losartan , metoprolol , doxazosin  and Imdur . - Patient on multiple medications, started by different providers. - Continue to hold Cardura  which was ordered by his PCP at [facility] for high blood pressure and BPH. - Continue to hold Imdur  as unclear who started patient on this med. - He will be started on low-dose metoprolol  specially with a known history of A-fib. -He is started on losartan  given history of diabetes. - Remains orthostatic so we will hold on resuming Lasix  and Aldactone . - Discontinue Cardura  altogether as patient is on Proscar  by his urologist. -Is not anymore on Flomax  - Please see above discussion, given he still is orthostatic I will discontinue his losartan , metoprolol  and Lasix  at this point    Chronic diastolic CHF - 2D echo 02/2024 had shown EF of  55- 60%, left ventricular diastolic parameters, right ventricular systolic function normal - Patient was discharged on Lasix  80 mg twice daily, Imdur , losartan , doxazosin , metoprolol , currently on hold -  Discharge weight was 127 kg, weight on admission 110 kg.  Suspect patient is volume down leading to AKI as above. - Appears to be euvolemic today, will start on low-dose Lasix .    UTI -UA positive for UTI, follow urine culture and sensitivities, - Urine culture growing Serratia, sensitive to Rocephin , will continue.   Diabetes mellitus type 2, IDDM hyperglycemia Hemoglobin A1c 7.9 on 03/08/2024 CBG (last 3)  Recent Labs    06/06/24 2102 06/07/24 0755 06/07/24 1225  GLUCAP 157* 121* 175*   Continue sliding scale  insulin , increase Lantus  to 10 units at bedtime  (outpatient on 15 units nightly)   Paroxysmal atrial fibrillation - Holding home metoprolol  as above, will resume at low-dose metoprolol  to avoid RVR. - Continue home Eliquis  -Will discontinue aspirin  -Patient remains profoundly orthostatic so I will discontinue metoprolol  that he is having heart rate in the lower 50s.   OSA - Continue home CPAP   History of QT prolongation - Avoid QT prolonging meds, correct electrolyte abnormalities   Gout - Holding allopurinol  in the setting of AKI, will resume allopurinol    Urinary retention - Continue home finasteride  (finasteride  prescribed by his urologist Dr. Devere, he was prescribed Cardura  by his primary care physician which has been discontinued, and he is not anymore on Flomax ) - Foley in place, inserted last April, and was exchanged most recently on June 30, but now he has UTI it needs to be replaced, we actually did a voiding trial, but unfortunately over the last 48 hours he is retaining significantly requiring In-N-Out, so we will reinsert Foley catheter.     Obesity class II Estimated body mass index is 39.2 kg/m as calculated from the following:   Height as of this encounter: 5' 4 (1.626 m).   Weight as of this encounter: 103.6 kg.  Code Status: Full CODE STATUS DVT Prophylaxis:  Place and maintain sequential compression device Start: 06/03/24 1123 Place TED hose Start: 06/03/24 1123 apixaban  (ELIQUIS ) tablet 5 mg   Level of Care: Level of care: Progressive Family Communication: Discussed with sister at bedside 7/10, and by phone on 7/13 Disposition Plan:      Home with home health  Procedures:    Consultants:   None  Antimicrobials:   Anti-infectives (From admission, onward)    Start     Dose/Rate Route Frequency Ordered Stop   06/01/24 1000  cefTRIAXone  (ROCEPHIN ) 1 g in sodium chloride  0.9 % 100 mL IVPB        1 g 200 mL/hr over 30 Minutes Intravenous Every 24  hours 06/01/24 0947            Medications  allopurinol   300 mg Oral q AM   apixaban   5 mg Oral BID   Chlorhexidine  Gluconate Cloth  6 each Topical Daily   docusate sodium   100 mg Oral BID   finasteride   5 mg Oral QPM   insulin  aspart  0-9 Units Subcutaneous TID WC   insulin  glargine-yfgn  10 Units Subcutaneous QHS   metoprolol  tartrate  12.5 mg Oral BID   pantoprazole   40 mg Oral Daily   sodium chloride  flush  3 mL Intravenous Q12H      Subjective:   Raymond Gordon patient with Foley catheter inserted yesterday, he was orthostatic with PT this morning. Objective:   Vitals:  06/07/24 0500 06/07/24 0754 06/07/24 1200 06/07/24 1221  BP:  (!) 167/52 (!) 90/44 (!) 106/41  Pulse:  60 (!) 53 (!) 56  Resp:  (!) 22 (!) 22 16  Temp:  98.4 F (36.9 C)  (!) 97.5 F (36.4 C)  TempSrc:  Oral (P) Oral Oral  SpO2:  99% 95% 97%  Weight: 103.6 kg     Height:        Intake/Output Summary (Last 24 hours) at 06/07/2024 1609 Last data filed at 06/07/2024 0641 Gross per 24 hour  Intake --  Output 800 ml  Net -800 ml     Wt Readings from Last 3 Encounters:  06/07/24 103.6 kg  03/31/24 120.3 kg  03/12/24 128.5 kg     Exam  Awake Alert, Oriented X 3, No new F.N deficits, Normal affect Symmetrical Chest wall movement, Good air movement bilaterally, CTAB RRR,No Gallops,Rubs or new Murmurs, No Parasternal Heave +ve B.Sounds, Abd Soft, No tenderness, No rebound - guarding or rigidity. No Cyanosis, Clubbing, chronic lower extremity skin changes, no edema.      Data Reviewed:  I have personally reviewed following labs    CBC Lab Results  Component Value Date   WBC 6.3 06/07/2024   RBC 4.61 06/07/2024   HGB 12.8 (L) 06/07/2024   HCT 41.3 06/07/2024   MCV 89.6 06/07/2024   MCH 27.8 06/07/2024   PLT 235 06/07/2024   MCHC 31.0 06/07/2024   RDW 17.5 (H) 06/07/2024   LYMPHSABS 1.3 05/31/2024   MONOABS 0.5 05/31/2024   EOSABS 0.1 05/31/2024   BASOSABS 0.0 05/31/2024      Last metabolic panel Lab Results  Component Value Date   NA 140 06/07/2024   K 4.5 06/07/2024   CL 105 06/07/2024   CO2 20 (L) 06/07/2024   BUN 39 (H) 06/07/2024   CREATININE 1.28 (H) 06/07/2024   GLUCOSE 182 (H) 06/07/2024   GFRNONAA 59 (L) 06/07/2024   GFRAA >60 08/21/2015   CALCIUM  9.4 06/07/2024   PHOS 3.3 06/07/2024   PROT 7.5 06/01/2024   ALBUMIN 3.0 (L) 06/04/2024   LABGLOB 3.1 11/02/2021   AGRATIO 1.5 11/02/2021   BILITOT 0.5 06/01/2024   ALKPHOS 76 06/01/2024   AST 17 06/01/2024   ALT 21 06/01/2024   ANIONGAP 15 06/07/2024    CBG (last 3)  Recent Labs    06/06/24 2102 06/07/24 0755 06/07/24 1225  GLUCAP 157* 121* 175*      Coagulation Profile: No results for input(s): INR, PROTIME in the last 168 hours.   Radiology Studies: I have personally reviewed the imaging studies  No results found.      Brayton Lye M.D. [facility] 06/07/2024, 4:09 PM  Available via Epic secure chat 7am-7pm After 7 pm, please refer to night coverage provider listed on amion.

## 2024-06-07 NOTE — Progress Notes (Signed)
   06/07/24 2000  BiPAP/CPAP/SIPAP  $ Non-Invasive Ventilator   (refused cpap)  Reason BIPAP/CPAP not in use Non-compliant   Refused

## 2024-06-07 NOTE — TOC Transition Note (Addendum)
 Transition of Care [facility]) - Discharge Note   Patient Details  Name: Raymond Gordon MRN: [id number] Date of Birth: July 31, 1951  Transition of Care [employer]) CM/SW Contact:  Robynn Eileen Hoose, RN Phone Number: 06/07/2024, 10:01 AM   Clinical Narrative:   Per provider, patient will be discharged home on home health disciplines. Referral sent to [facility] with [facility] and accepted. Contact information placed on AVS.  1551: Discharge cancelled by provider. Burnard with [facility] made aware.    Final next level of care: Home w Home Health Services Barriers to Discharge: No Barriers Identified   Patient Goals and CMS Choice Patient states their goals for this hospitalization and ongoing recovery are:: Rehab CMS Medicare.gov Compare Post Acute Care list provided to:: Patient Represenative (must comment) Choice offered to / list presented to : [place] ownership interest in [facility].provided to:: Adult Children    Discharge Placement                       Discharge Plan and Services Additional resources added to the After Visit Summary for   In-house Referral: Clinical Social Work   Post Acute Care Choice: Skilled Nursing Facility                    HH Arranged: PT, OT, Nurse's Aide, RN, Social Work [employer] Agency: [facility] Date [facility] Agency Contacted: 06/07/24 Time HH Agency Contacted: [phone] Representative spoke with at [facility] Agency: Burnard  Social Drivers of Health (SDOH) Interventions SDOH Screenings   Food Insecurity: No Food Insecurity (06/01/2024)  Housing: Low Risk  (06/01/2024)  Transportation Needs: No Transportation Needs (06/03/2024)  Utilities: Not At Risk (06/03/2024)  Social Connections: Moderately Integrated (06/01/2024)  Tobacco Use: Medium Risk (05/04/2024)     Readmission Risk Interventions     No data to display

## 2024-06-08 ENCOUNTER — Ambulatory Visit: Admitting: Podiatry

## 2024-06-08 DIAGNOSIS — N179 Acute kidney failure, unspecified: Secondary | ICD-10-CM | POA: Diagnosis not present

## 2024-06-08 LAB — CBC
HCT: 39 % (ref 39.0–52.0)
Hemoglobin: 12.2 g/dL — ABNORMAL LOW (ref 13.0–17.0)
MCH: 28.2 pg (ref 26.0–34.0)
MCHC: 31.3 g/dL (ref 30.0–36.0)
MCV: 90.1 fL (ref 80.0–100.0)
Platelets: 237 K/uL (ref 150–400)
RBC: 4.33 MIL/uL (ref 4.22–5.81)
RDW: 17.2 % — ABNORMAL HIGH (ref 11.5–15.5)
WBC: 7.1 K/uL (ref 4.0–10.5)
nRBC: 0 % (ref 0.0–0.2)

## 2024-06-08 LAB — BASIC METABOLIC PANEL WITH GFR
Anion gap: 14 (ref 5–15)
BUN: 43 mg/dL — ABNORMAL HIGH (ref 8–23)
CO2: 19 mmol/L — ABNORMAL LOW (ref 22–32)
Calcium: 9.4 mg/dL (ref 8.9–10.3)
Chloride: 109 mmol/L (ref 98–111)
Creatinine, Ser: 1.16 mg/dL (ref 0.61–1.24)
GFR, Estimated: >60 mL/min (ref >60)
Glucose, Bld: 152 mg/dL — ABNORMAL HIGH (ref 70–99)
Potassium: 4.4 mmol/L (ref 3.5–5.1)
Sodium: 142 mmol/L (ref 135–145)

## 2024-06-08 LAB — CORTISOL: Cortisol, Plasma: 13.8 ug/dL

## 2024-06-08 LAB — PHOSPHORUS: Phosphorus: 2.9 mg/dL (ref 2.5–4.6)

## 2024-06-08 LAB — MAGNESIUM: Magnesium: 2 mg/dL (ref 1.7–2.4)

## 2024-06-08 LAB — GLUCOSE, CAPILLARY
Glucose-Capillary: 121 mg/dL — ABNORMAL HIGH (ref 70–99)
Glucose-Capillary: 177 mg/dL — ABNORMAL HIGH (ref 70–99)
Glucose-Capillary: 159 mg/dL — ABNORMAL HIGH (ref 70–99)
Glucose-Capillary: 162 mg/dL — ABNORMAL HIGH (ref 70–99)

## 2024-06-08 NOTE — Progress Notes (Signed)
 [facility]                                                                              [name], is a 73 y.o. male, DOB - [birth year], FMW:[id number] Admit date - 05/31/2024    Outpatient Primary MD for the patient is [name], [name], MD  LOS - 7  days  Chief Complaint  Patient presents with   Fall       Brief summary   Patient is a 73 year old male with HTN, hyperlipidemia, DM, paroxysmal A-fib on Eliquis , chronic diastolic CHF, OSA, obesity, QT prolongation, gout, urinary retention with Foley presenting with near syncope.  Patient reported multiple falls in the past several days with lightheadedness, no syncope or hitting his head.  Reported intermittent dizziness preceding his fall.  EMS was called and systolic BP was noted to be in 80s. Patient was admitted 03/08/2024-03/12/2024 for heart failure exacerbation. Discharge weight was 127 kg at that time.  It was 110 on admission, patient with chronic lower extremity edema/possible lymphedema, but on presentation this visit this all has resolved, appears to be dehydrated, volume depleted, he was hypotensive in ED 73/48.  Pain up to 2.66, admitted for further workup .  Assessment & Plan    AKI (acute kidney injury) (HCC) with hyperkalemia -Presented with creatinine of 2.66, potassium 5.3, metabolic acidosis - Lasix , Aldactone , losartan , metoprolol , doxazosin  held, placed on gentle hydration - AKI most likely in the setting of volume depletion/and hypotension  - Improved with holding these meds and IV fluids  Near syncope, falls, orthostasis - Likely due to acute kidney injury, overdiuresis, - Had a recent 2D echo 02/2024 which had shown EF of 55- 60%, left ventricular diastolic parameters, right ventricular systolic function normal - Improving with IV fluids -Patient resumed gradually on his home regimen after blood pressure has stabilized, and lowered his Lasix  dose, but he was significantly orthostatic yesterday  blood pressure 75/63, these meds has been discontinued currently. - He was instructed to keep wearing TED hose and abdominal binder upon standing. -I will discontinue his losartan , and Lasix , he was on metoprolol  mainly for known A-fib, but overall heart rate is controlled, maintaining sinus rhythm in the 50s so it was stopped as well, heart rate remained in the 50s, so we will hold on resuming  Essential hypertension  -on  Lasix , Aldactone , losartan , metoprolol , doxazosin  and Imdur . - Patient on multiple medications contributes to dehydration and low blood pressure, does appear he has been started on different medication by different providers. - Continue to hold Cardura  which was ordered by his PCP at [facility] for high blood pressure and BPH. - Continue to hold Imdur  as unclear who started patient on this med. -He remains on significant dose Lasix  80 mg p.o. twice daily at home. - He will be started on low-dose metoprolol  specially with a known history of A-fib. -He is started on losartan  given history of diabetes. - He was started on Lasix  40 mg oral daily -He was significantly orthostatic, so her losartan , low-dose metoprolol  and Lasix  has been discontinued yesterday. - Overall blood pressure is very labile, but it has  been checked by multiple cuffs in both wrist and arm area, discussed with staff to be consistent checking in the arm area.    Chronic diastolic CHF - 2D echo 02/2024 had shown EF of 55- 60%, left ventricular diastolic parameters, right ventricular systolic function normal - Patient was discharged on Lasix  80 mg twice daily, Imdur , losartan , doxazosin , metoprolol , currently on hold -  Discharge weight was 127 kg, weight on admission 110 kg.  Suspect patient is volume down leading to AKI as above. - I have stopped his Lasix  so it does not show given he was orthostatic yesterday (Cardama lower dose 40 mg daily)    UTI -UA positive for UTI, follow urine culture and  sensitivities, - Urine culture growing Serratia, sensitive to Rocephin . -He finished his treatment with IV Rocephin  during hospital stay, no further antibiotic at the time of discharge Diabetes mellitus type 2, IDDM hyperglycemia Hemoglobin A1c 7.9 on 03/08/2024 CBG (last 3)  Recent Labs    06/07/24 2231 06/08/24 0800 06/08/24 1110  GLUCAP 150* 121* 177*   Continue sliding scale insulin , increase Lantus  to 10 units at bedtime  (outpatient on 15 units nightly)   Paroxysmal atrial fibrillation - Holding home metoprolol  as above, will resume at low-dose metoprolol  to avoid RVR. - Continue home Eliquis  -Will discontinue aspirin  - He remains off metoprolol .  As heart rate remains in the lower 50s.    OSA - Continue home CPAP   History of QT prolongation - Avoid QT prolonging meds, correct electrolyte abnormalities   Gout - Holding allopurinol  in the setting of AKI, will resume allopurinol    Urinary retention BPH - Continue home finasteride  (finasteride  prescribed by his urologist Dr. Devere, he was prescribed Cardura  by his primary care physician which has been discontinued, and he is not anymore on Flomax ) hold Flomax  and Cardura  on discharge. - Foley in place, inserted last April, and was exchanged most recently on June 30, but now he has UTI it needs to be replaced, we actually did a voiding trial, but unfortunately over the last 48 hours he is retaining significantly requiring In-N-Out, so Foley catheter was reinserted 7/14, continue Foley catheter on discharge   Obesity class II Estimated body mass index is 39.32 kg/m as calculated from the following:   Height as of this encounter: 5' 4 (1.626 m).   Weight as of this encounter: 103.9 kg.  Code Status: Full CODE STATUS DVT Prophylaxis:  Place and maintain sequential compression device Start: 06/03/24 1123 Place TED hose Start: 06/03/24 1123 apixaban  (ELIQUIS ) tablet 5 mg   Level of Care: Level of care:  Progressive Family Communication: Discussed with sister at bedside 7/10, and by phone on 7/13. Disposition Plan:      Home with home health  Procedures:    Consultants:   None  Antimicrobials:   Anti-infectives (From admission, onward)    Start     Dose/Rate Route Frequency Ordered Stop   06/01/24 1000  cefTRIAXone  (ROCEPHIN ) 1 g in sodium chloride  0.9 % 100 mL IVPB  Status:  Discontinued        1 g 200 mL/hr over 30 Minutes Intravenous Every 24 hours 06/01/24 0947 06/08/24 1254          Medications  allopurinol   300 mg Oral q AM   apixaban   5 mg Oral BID   Chlorhexidine  Gluconate Cloth  6 each Topical Daily   docusate sodium   100 mg Oral BID   finasteride   5 mg Oral QPM  insulin  aspart  0-9 Units Subcutaneous TID WC   insulin  glargine-yfgn  10 Units Subcutaneous QHS   pantoprazole   40 mg Oral Daily   sodium chloride  flush  3 mL Intravenous Q12H      Subjective:   Raymond Gordon patient denies any complaints today, report generalized weakness, fatigue and some lightheadedness upon standing. Objective:   Vitals:   06/08/24 0126 06/08/24 0500 06/08/24 0801 06/08/24 1108  BP: (!) 167/60  (!) 173/59 (!) 127/49  Pulse:   65 (!) 54  Resp: 17  18 17   Temp: 98.1 F (36.7 C)  98.7 F (37.1 C) 98.5 F (36.9 C)  TempSrc: Oral  Oral Oral  SpO2: 94%  95% 98%  Weight:  103.9 kg    Height:        Intake/Output Summary (Last 24 hours) at 06/08/2024 1322 Last data filed at 06/08/2024 0914 Gross per 24 hour  Intake 780 ml  Output 1025 ml  Net -245 ml     Wt Readings from Last 3 Encounters:  06/08/24 103.9 kg  03/31/24 120.3 kg  03/12/24 128.5 kg     Exam  Awake Alert, Oriented X 3, No new F.N deficits, Normal affect Symmetrical Chest wall movement, Good air movement bilaterally, CTAB RRR,No Gallops,Rubs or new Murmurs, No Parasternal Heave +ve B.Sounds, Abd Soft, No tenderness, No rebound - guarding or rigidity. No Cyanosis, Clubbing or edema, No new Rash or  bruise         Data Reviewed:  I have personally reviewed following labs    CBC Lab Results  Component Value Date   WBC 7.1 06/08/2024   RBC 4.33 06/08/2024   HGB 12.2 (L) 06/08/2024   HCT 39.0 06/08/2024   MCV 90.1 06/08/2024   MCH 28.2 06/08/2024   PLT 237 06/08/2024   MCHC 31.3 06/08/2024   RDW 17.2 (H) 06/08/2024   LYMPHSABS 1.3 05/31/2024   MONOABS 0.5 05/31/2024   EOSABS 0.1 05/31/2024   BASOSABS 0.0 05/31/2024     Last metabolic panel Lab Results  Component Value Date   NA 142 06/08/2024   K 4.4 06/08/2024   CL 109 06/08/2024   CO2 19 (L) 06/08/2024   BUN 43 (H) 06/08/2024   CREATININE 1.16 06/08/2024   GLUCOSE 152 (H) 06/08/2024   GFRNONAA >60 06/08/2024   GFRAA >60 08/21/2015   CALCIUM  9.4 06/08/2024   PHOS 2.9 06/08/2024   PROT 7.5 06/01/2024   ALBUMIN 3.0 (L) 06/04/2024   LABGLOB 3.1 11/02/2021   AGRATIO 1.5 11/02/2021   BILITOT 0.5 06/01/2024   ALKPHOS 76 06/01/2024   AST 17 06/01/2024   ALT 21 06/01/2024   ANIONGAP 14 06/08/2024    CBG (last 3)  Recent Labs    06/07/24 2231 06/08/24 0800 06/08/24 1110  GLUCAP 150* 121* 177*      Coagulation Profile: No results for input(s): INR, PROTIME in the last 168 hours.   Radiology Studies: I have personally reviewed the imaging studies  No results found.      Brayton Lye M.D. [facility] 06/08/2024, 1:22 PM  Available via Epic secure chat 7am-7pm After 7 pm, please refer to night coverage provider listed on amion.

## 2024-06-08 NOTE — TOC Progression Note (Signed)
 Transition of Care [facility]) - Progression Note    Patient Details  Name: Raymond Gordon MRN: [id number] Date of Birth: Apr 01, 1951  [facility] CM/SW Contact  Raymond Gell, RN Phone Number: 06/08/2024, 2:44 PM  Clinical Narrative:     Raymond Gordon w patient's sister Raymond Gordon. Discussed needs for DC, reviewed that SNF has not been approved.  Reviewed that patient will have HH services through [facility] at DC. Raymond Gordon states that patient has all needed DME at home.  We discussed additional home custodial care through [facility]. I spoke w [facility] about referral process and once referral is made to company on [facility] website list, the company will get auth for services.  Spoke w Raymond Gordon, discussed options, first choice was [facility], who stated they were not in network. Raymond Gordon would not respond, [facility] liaison Raymond Gordon stated she could accept. Referral scanned to her at [email or web address]. Raymond Gordon of provider.   Expected Discharge Plan: Home w Home Health Services Barriers to Discharge: Continued Medical Work up  Expected Discharge Plan and Services In-house Referral: Clinical Social Work Discharge Planning Services: CM Consult Post Acute Care Choice: Skilled Nursing Facility Living arrangements for the past 2 months: Single Family Home Expected Discharge Date: 06/07/24                         HH Arranged: PT, OT, Nurse's Aide, RN, Social Work [employer] Agency: [facility] Date HH Agency Contacted: 06/07/24 Time HH Agency Contacted: [phone] Representative spoke with at [facility] Agency: Burnard   Social Determinants of Health (SDOH) Interventions SDOH Screenings   Food Insecurity: No Food Insecurity (06/01/2024)  Housing: Low Risk  (06/01/2024)  Transportation Needs: No Transportation Needs (06/03/2024)  Utilities: Not At Risk (06/03/2024)  Social Connections: Moderately Integrated (06/01/2024)  Tobacco Use: Medium Risk (05/04/2024)    Readmission Risk Interventions     No  data to display

## 2024-06-08 NOTE — Plan of Care (Signed)
  Problem: Coping: Goal: Ability to adjust to condition or change in health will improve Outcome: Progressing   Problem: Fluid Volume: Goal: Ability to maintain a balanced intake and output will improve Outcome: Progressing   Problem: Metabolic: Goal: Ability to maintain appropriate glucose levels will improve Outcome: Progressing   Problem: Nutritional: Goal: Maintenance of adequate nutrition will improve Outcome: Progressing   Problem: Skin Integrity: Goal: Risk for impaired skin integrity will decrease Outcome: Progressing   Problem: Education: Goal: Knowledge of General Education information will improve Description: Including pain rating scale, medication(s)/side effects and non-pharmacologic comfort measures Outcome: Progressing   Problem: Clinical Measurements: Goal: Ability to maintain clinical measurements within normal limits will improve Outcome: Progressing Goal: Respiratory complications will improve Outcome: Progressing Goal: Cardiovascular complication will be avoided Outcome: Progressing   Problem: Activity: Goal: Risk for activity intolerance will decrease Outcome: Progressing

## 2024-06-08 NOTE — Progress Notes (Signed)
   06/08/24 2258  BiPAP/CPAP/SIPAP  BiPAP/CPAP/SIPAP Pt Type Adult  BiPAP/CPAP/SIPAP DREAMSTATIOND  Mask Type Full face mask  Mask Size Large  EPAP 6 cmH2O  FiO2 (%) 21 %  Patient Home Machine No  Patient Home Mask No  Patient Home Tubing No  Auto Titrate No  CPAP/SIPAP surface wiped down Yes  Device Plugged into [employer] Outlet Yes

## 2024-06-08 NOTE — TOC Progression Note (Addendum)
 Transition of Care [facility]) - Progression Note    Patient Details  Name: Raymond Gordon MRN: [id number] Date of Birth: 1950/12/20  Transition of Care [facility]) CM/SW Contact  Inocente GORMAN Kindle, LCSW Phone Number: 06/08/2024, 2:32 PM  Clinical Narrative:    CSW received request to speak with patient's sister. CSW spoke with her and she stated the [facility] was able to answer her questions. CSW encouraged her to apply for Medicaid for patient as soon as she can and she stated they would do that this week. CSW emailed her Assisted Living and family care home lists.    Expected Discharge Plan: Home w Home Health Services Barriers to Discharge: Continued Medical Work up  Expected Discharge Plan and Services In-house Referral: Clinical Social Work Discharge Planning Services: CM Consult Post Acute Care Choice: Skilled Nursing Facility Living arrangements for the past 2 months: Single Family Home Expected Discharge Date: 06/07/24                         HH Arranged: PT, OT, Nurse's Aide, RN, Social Work [employer] Agency: [facility] Date HH Agency Contacted: 06/07/24 Time HH Agency Contacted: [phone] Representative spoke with at [facility] Agency: [place]   Social Determinants of Health (SDOH) Interventions SDOH Screenings   Food Insecurity: No Food Insecurity (06/01/2024)  Housing: Low Risk  (06/01/2024)  Transportation Needs: No Transportation Needs (06/03/2024)  Utilities: Not At Risk (06/03/2024)  Social Connections: Moderately Integrated (06/01/2024)  Tobacco Use: Medium Risk (05/04/2024)    Readmission Risk Interventions     No data to display

## 2024-06-08 NOTE — Progress Notes (Signed)
 Occupational Therapy Treatment Patient Details Name: Raymond Gordon MRN: [id number] DOB: July 03, 1951 Today's Date: 06/08/2024   History of present illness Patient is a 73 year old male presenting with near syncope. PMH:  UTI, HTN, hyperlipidemia, DM, paroxysmal A-fib on Eliquis , chronic diastolic CHF, OSA, obesity, QT prolongation, gout, urinary retention with Foley   OT comments  Pt resting in recliner, asked to use restroom. Pt BP 164/57 sitting in recliner. Pt CGA for ambulation and trasnfer to toilet 25 feet with RW, increased time. Pt needs max A for toileting hygiene standing, not able to reach back to assist. Pt fatigued and assisted back to bed, mod A for assist with BLEs back to bed. Will conitnue to see Pt acutely to progress as able, DC to postacute rehab <3hrs/day still appropriate.       If plan is discharge home, recommend the following:  A little help with walking and/or transfers;A little help with bathing/dressing/bathroom;Assistance with cooking/housework;Assist for transportation;Help with stairs or ramp for entrance;Direct supervision/assist for financial management   Equipment Recommendations  None recommended by OT    Recommendations for Other Services      Precautions / Restrictions Precautions Precautions: Fall Recall of Precautions/Restrictions: Intact Restrictions Weight Bearing Restrictions Per Provider Order: No       Mobility Bed Mobility Overal bed mobility: Needs Assistance Bed Mobility: Sit to Supine     Supine to sit: Mod assist     General bed mobility comments: mod A into bed with BLEs    Transfers Overall transfer level: Needs assistance Equipment used: Rolling walker (2 wheels) Transfers: Sit to/from Stand Sit to Stand: Contact guard assist           General transfer comment: CGA with RW     Balance Overall balance assessment: Needs assistance Sitting-balance support: No upper extremity supported, Feet supported Sitting  balance-Leahy Scale: Fair     Standing balance support: Bilateral upper extremity supported, During functional activity Standing balance-Leahy Scale: Poor Standing balance comment: reliant on RW                           ADL either performed or assessed with clinical judgement   ADL Overall ADL's : Needs assistance/impaired                         Toilet Transfer: Contact guard assist;Rolling walker (2 wheels);BSC/3in1   Toileting- Clothing Manipulation and Hygiene: Maximal assistance;Sit to/from stand       Functional mobility during ADLs: Contact guard assist;Rolling walker (2 wheels) General ADL Comments: CGA to assist to toilet with increased time using RW, BSC over toilet, max A for toietling hygiene    Extremity/Trunk Assessment Upper Extremity Assessment Upper Extremity Assessment: Overall WFL for tasks assessed            Vision       Perception     Praxis     Communication Communication Communication: No apparent difficulties   Cognition Arousal: Alert Behavior During Therapy: WFL for tasks assessed/performed Cognition: No apparent impairments                               Following commands: Intact        Cueing   Cueing Techniques: Verbal cues, Tactile cues  Exercises      Shoulder Instructions       General Comments  Pertinent Vitals/ Pain       Pain Assessment Pain Assessment: No/denies pain  Home Living                                          Prior Functioning/Environment              Frequency  Min 2X/week        Progress Toward Goals  OT Goals(current goals can now be found in the care plan section)  Progress towards OT goals: Progressing toward goals  Acute Rehab OT Goals Patient Stated Goal: to improve strength OT Goal Formulation: With patient Time For Goal Achievement: 06/15/24 Potential to Achieve Goals: Good ADL Goals Pt Will Perform Grooming: with  supervision;standing Pt Will Perform Lower Body Bathing: with supervision;sit to/from stand Pt Will Perform Lower Body Dressing: with supervision;sit to/from stand Pt Will Transfer to Toilet: with supervision;ambulating;bedside commode Pt Will Perform Toileting - Clothing Manipulation and hygiene: with supervision;sit to/from stand  Plan      Co-evaluation                 AM-PAC OT 6 Clicks Daily Activity     Outcome Measure   Help from another person eating meals?: None Help from another person taking care of personal grooming?: A Little Help from another person toileting, which includes using toliet, bedpan, or urinal?: A Lot Help from another person bathing (including washing, rinsing, drying)?: A Little Help from another person to put on and taking off regular upper body clothing?: A Little Help from another person to put on and taking off regular lower body clothing?: A Lot 6 Click Score: 17    End of Session Equipment Utilized During Treatment: Gait belt;Rolling walker (2 wheels)  OT Visit Diagnosis: Unsteadiness on feet (R26.81);Other abnormalities of gait and mobility (R26.89);Muscle weakness (generalized) (M62.81);Pain Pain - Right/Left: Right Pain - part of body: Ankle and joints of foot   Activity Tolerance Patient tolerated treatment well   Patient Left in bed;with call bell/phone within reach;with bed alarm set   Nurse Communication Mobility status        Time: 8594-8561 OT Time Calculation (min): 33 min  Charges: OT General Charges $OT Visit: 1 Visit OT Treatments $Self Care/Home Management : 23-37 mins  McMurray, OTR/L   Raymond Gordon 06/08/2024, 2:44 PM

## 2024-06-09 DIAGNOSIS — N179 Acute kidney failure, unspecified: Secondary | ICD-10-CM | POA: Diagnosis not present

## 2024-06-09 DIAGNOSIS — E1169 Type 2 diabetes mellitus with other specified complication: Secondary | ICD-10-CM | POA: Diagnosis not present

## 2024-06-09 DIAGNOSIS — G4733 Obstructive sleep apnea (adult) (pediatric): Secondary | ICD-10-CM | POA: Diagnosis not present

## 2024-06-09 DIAGNOSIS — I10 Essential (primary) hypertension: Secondary | ICD-10-CM | POA: Diagnosis not present

## 2024-06-09 LAB — GLUCOSE, CAPILLARY
Glucose-Capillary: 132 mg/dL — ABNORMAL HIGH (ref 70–99)
Glucose-Capillary: 182 mg/dL — ABNORMAL HIGH (ref 70–99)

## 2024-06-09 MED ORDER — HYDRALAZINE HCL 25 MG PO TABS
25.0000 mg | ORAL_TABLET | Freq: Two times a day (BID) | ORAL | Status: DC
  Administered 2024-06-09 – 2024-06-10 (×3): 25 mg via ORAL
  Filled 2024-06-09 (×3): qty 1

## 2024-06-09 NOTE — TOC Progression Note (Signed)
 Transition of Care [facility]) - Progression Note    Patient Details  Name: Raymond Gordon MRN: [id number] Date of Birth: [birth year]  Transition of Care [facility]) CM/SW Contact  Marval Gell, RN Phone Number: 06/09/2024, 4:17 PM  Clinical Narrative:     BSC ordered through [employer] to be delivered to the patient's house   Expected Discharge Plan: Home w Home Health Services Barriers to Discharge: Continued Medical Work up  Expected Discharge Plan and Services In-house Referral: Clinical Social Work Discharge Planning Services: CM Consult Post Acute Care Choice: Skilled Nursing Facility Living arrangements for the past 2 months: Single Family Home Expected Discharge Date: 06/07/24                         HH Arranged: PT, OT, Nurse's Aide, RN, Social Work [employer] Agency: [facility] Date HH Agency Contacted: 06/07/24 Time HH Agency Contacted: [phone] Representative spoke with at [facility] Agency: [place]   Social Determinants of Health (SDOH) Interventions SDOH Screenings   Food Insecurity: No Food Insecurity (06/01/2024)  Housing: Low Risk  (06/01/2024)  Transportation Needs: No Transportation Needs (06/03/2024)  Utilities: Not At Risk (06/03/2024)  Social Connections: Moderately Integrated (06/01/2024)  Tobacco Use: Medium Risk (05/04/2024)    Readmission Risk Interventions     No data to display

## 2024-06-09 NOTE — Progress Notes (Signed)
 Physical Therapy Treatment Patient Details Name: Raymond Gordon MRN: [id number] DOB: 16-Mar-1951 Today's Date: 06/09/2024   History of Present Illness Patient is a 73 year old male presenting with near syncope. PMH:  UTI, HTN, hyperlipidemia, DM, paroxysmal A-fib on Eliquis , chronic diastolic CHF, OSA, obesity, QT prolongation, gout, urinary retention with Foley    PT Comments  Pt tolerated treatment well today. Pt was able to progress ambulation distance in hallway today. BP: 142/46 seated, BP: 138/50, BP: 134/52 seated after ambulation. Abdominal binder and ted hose were donned prior to ambulation. No change in DC/DME recs at this time. PT will continue to follow.     If plan is discharge home, recommend the following: A little help with bathing/dressing/bathroom;Assistance with cooking/housework;Direct supervision/assist for medications management;Direct supervision/assist for financial management;Assist for transportation;Help with stairs or ramp for entrance;Supervision due to cognitive status;A little help with walking and/or transfers   Can travel by private vehicle     No  Equipment Recommendations  None recommended by PT    Recommendations for Other Services       Precautions / Restrictions Precautions Precautions: Fall Recall of Precautions/Restrictions: Intact Precaution/Restrictions Comments: Abdominal binder and ted hose donned. Restrictions Weight Bearing Restrictions Per Provider Order: No     Mobility  Bed Mobility               General bed mobility comments: Up in recliner    Transfers Overall transfer level: Needs assistance Equipment used: Rolling walker (2 wheels) Transfers: Sit to/from Stand Sit to Stand: Contact guard assist           General transfer comment: CGA with RW. Increased time and reliant on momentum.    Ambulation/Gait Ambulation/Gait assistance: +2 safety/equipment, Contact guard assist Gait Distance (Feet): 50 Feet Assistive  device: Rolling walker (2 wheels) Gait Pattern/deviations: Decreased stride length, Step-through pattern, Shuffle Gait velocity: decreased     General Gait Details: Verbal cues for increased foot clearance and step length. Chair follow utilized   Comptroller Bed    Modified Rankin (Stroke Patients Only)       Balance Overall balance assessment: Needs assistance Sitting-balance support: No upper extremity supported, Feet supported Sitting balance-Leahy Scale: Fair     Standing balance support: Bilateral upper extremity supported, During functional activity Standing balance-Leahy Scale: Poor Standing balance comment: reliant on RW                            Communication Communication Communication: No apparent difficulties  Cognition Arousal: Alert Behavior During Therapy: WFL for tasks assessed/performed                             Following commands: Intact      Cueing Cueing Techniques: Verbal cues, Tactile cues  Exercises      General Comments General comments (skin integrity, edema, etc.): BP: 142/46 seated, BP: 138/50, BP: 134/52 seated after ambulation.      Pertinent Vitals/Pain Pain Assessment Pain Assessment: No/denies pain    Home Living                          Prior Function            PT Goals (current goals can now be found in the care plan section)  Progress towards PT goals: Progressing toward goals    Frequency    Min 2X/week      PT Plan      Co-evaluation              AM-PAC PT 6 Clicks Mobility   Outcome Measure  Help needed turning from your back to your side while in a flat bed without using bedrails?: A Little Help needed moving from lying on your back to sitting on the side of a flat bed without using bedrails?: A Little Help needed moving to and from a bed to a chair (including a wheelchair)?: A Little Help needed standing up from  a chair using your arms (e.g., wheelchair or bedside chair)?: A Little Help needed to walk in hospital room?: A Lot Help needed climbing 3-5 steps with a railing? : Total 6 Click Score: 15    End of Session Equipment Utilized During Treatment: Gait belt Activity Tolerance: Patient tolerated treatment well Patient left: with call bell/phone within reach;in chair Nurse Communication: Mobility status PT Visit Diagnosis: Other abnormalities of gait and mobility (R26.89)     Time: 9047-8989 PT Time Calculation (min) (ACUTE ONLY): 18 min  Charges:    $Gait Training: 8-22 mins PT General Charges $$ ACUTE PT VISIT: 1 Visit                     Sueellen NOVAK, PT, DPT Acute Rehab Services [phone]    Avaleen Brownley 06/09/2024, 12:23 PM

## 2024-06-09 NOTE — Plan of Care (Signed)
  Problem: Coping: Goal: Ability to adjust to condition or change in health will improve Outcome: Progressing   Problem: Fluid Volume: Goal: Ability to maintain a balanced intake and output will improve Outcome: Progressing   Problem: Metabolic: Goal: Ability to maintain appropriate glucose levels will improve Outcome: Progressing   Problem: Nutritional: Goal: Maintenance of adequate nutrition will improve Outcome: Progressing   Problem: Education: Goal: Knowledge of General Education information will improve Description: Including pain rating scale, medication(s)/side effects and non-pharmacologic comfort measures Outcome: Progressing   Problem: Activity: Goal: Risk for activity intolerance will decrease Outcome: Progressing

## 2024-06-09 NOTE — Progress Notes (Addendum)
 PROGRESS NOTE        PATIENT DETAILS Name: JOJO PEHL Age: 73 y.o. Sex: male Date of Birth: 02-Dec-1950 Admit Date: 05/31/2024 Admitting Physician Marsa KATHEE Scurry, MD [name], [name], MD  Brief Summary: Patient is a 73 y.o.  male with history of HTN, HLD, DM-2, PAF, chronic HFpEF, BPH with chronic indwelling Foley catheter who presented with a fall/near syncope-was found to have hypotension along with AKI secondary to volume depletion.  Significant events: 7/7>> admit to [facility]  Significant studies: 7/7>> CXR: No PNA 7/7>> CT head: No acute findings. 7/7>> Renal ultrasound: No hydronephrosis.  Significant microbiology data: 7/7>> urine culture: Serratia  Procedures: None  Consults: None  Subjective: Lying comfortably in bed-denies any chest pain or shortness of breath.  Systolic blood pressure of 189 this morning when I was rounding.  Objective: Vitals: Blood pressure (!) 142/59, pulse (!) 54, temperature (!) 97.5 F (36.4 C), temperature source Oral, resp. rate 16, height 5' 4 (1.626 m), weight 105.7 kg, SpO2 92%.   Exam: Gen Exam:Alert awake-not in any distress HEENT:atraumatic, normocephalic Chest: B/L clear to auscultation anteriorly CVS:S1S2 regular Abdomen:soft non tender, non distended Neurology: Non focal Skin: no rash  Pertinent Labs/Radiology:    Latest Ref Rng & Units 06/08/2024    8:17 AM 06/07/2024    2:07 PM 06/05/2024    8:25 AM  CBC  WBC 4.0 - 10.5 K/uL 7.1  6.3  6.5   Hemoglobin 13.0 - 17.0 g/dL 87.7  87.1  87.5   Hematocrit 39.0 - 52.0 % 39.0  41.3  39.5   Platelets 150 - 400 K/uL 237  235  277     Lab Results  Component Value Date   NA 142 06/08/2024   K 4.4 06/08/2024   CL 109 06/08/2024   CO2 19 (L) 06/08/2024      Assessment/Plan: AKI Hemodynamically mediated in the setting of hypotension/diuretics/ARB Resolved with supportive care  Hyperkalemia Secondary to AKI/losartan /Aldactone  Resolved  with supportive care.  Near syncope Secondary to orthostatic hypotension/volume depletion Recent echo April 2025-EF 55 to 60% Telemetry unremarkable. Mobilize with PT/OT  Orthostatic hypotension Difficult situation-has resting hypertension with ongoing issues with orthostatic hypotension Given significantly elevated blood pressure this morning-will start low-dose hydralazine  and see how he does. Continue TED hose/abdominal binder possible Mobilize with PT/OT-see how symptomatic he is.  Addendum Discussed with PT-BP: 142/46 seated, BP: 138/50 standing, BP: 134/52 seated after ambulating   Chronic HFpEF Stable/euvolemic Diuretics on hold given issues with volume depletion/orthostatic hypotension.  PAF Rate controlled Telemetry monitoring Eliquis .  QTc prolongation Stable QTc on EKG on 7/8 Telemetry monitoring.  Complicated UTI Completed a course of antibiotics.  DM-2 (A1c 7.9 on 4/14) CBGs stable Semglee  10 units daily+ SSI.  Recent Labs    06/08/24 2056 06/09/24 0733 06/09/24 1139  GLUCAP 162* 132* 182*    Gout Allopurinol .  BPH Chronic indwelling Foley catheter Continue finasteride   OSA CPAP  Class 3 Obesity: Estimated body mass index is 40 kg/m as calculated from the following:   Height as of this encounter: 5' 4 (1.626 m).   Weight as of this encounter: 105.7 kg.   Code status:   Code Status: Full Code   DVT Prophylaxis: Place and maintain sequential compression device Start: 06/03/24 1123 Place TED hose Start: 06/03/24 1123 apixaban  (ELIQUIS ) tablet 5 mg  Family Communication: Sister [name] [phone] updated 7/16   Disposition Plan: Status is: Inpatient Remains inpatient appropriate because: Severity of illness   Planned Discharge Destination:Home health (per TOC-insurance company is declining SNF)   Diet: Diet Order             Diet - low sodium heart healthy           Diet heart healthy/carb modified Room  service appropriate? Yes; Fluid consistency: Thin  Diet effective now                     Antimicrobial agents: Anti-infectives (From admission, onward)    Start     Dose/Rate Route Frequency Ordered Stop   06/01/24 1000  cefTRIAXone  (ROCEPHIN ) 1 g in sodium chloride  0.9 % 100 mL IVPB  Status:  Discontinued        1 g 200 mL/hr over 30 Minutes Intravenous Every 24 hours 06/01/24 0947 06/08/24 1254        MEDICATIONS: Scheduled Meds:  allopurinol   300 mg Oral q AM   apixaban   5 mg Oral BID   Chlorhexidine  Gluconate Cloth  6 each Topical Daily   docusate sodium   100 mg Oral BID   finasteride   5 mg Oral QPM   hydrALAZINE   25 mg Oral Q12H   insulin  aspart  0-9 Units Subcutaneous TID WC   insulin  glargine-yfgn  10 Units Subcutaneous QHS   pantoprazole   40 mg Oral Daily   sodium chloride  flush  3 mL Intravenous Q12H   Continuous Infusions: PRN Meds:.acetaminophen  **OR** acetaminophen , hydrALAZINE , polyethylene glycol   I have personally reviewed following labs and imaging studies  LABORATORY DATA: CBC: Recent Labs  Lab 06/03/24 0606 06/05/24 0825 06/07/24 1407 06/08/24 0817  WBC 5.4 6.5 6.3 7.1  HGB 11.4* 12.4* 12.8* 12.2*  HCT 35.6* 39.5 41.3 39.0  MCV 86.4 87.8 89.6 90.1  PLT 279 277 235 237    Basic Metabolic Panel: Recent Labs  Lab 06/03/24 0606 06/04/24 0612 06/05/24 0825 06/07/24 1407 06/08/24 0817  NA 139 141 142 140 142  K 4.3 4.2 4.4 4.5 4.4  CL 108 110 112* 105 109  CO2 20* 21* 22 20* 19*  GLUCOSE 157* 162* 150* 182* 152*  BUN 51* 42* 42* 39* 43*  CREATININE 1.08 1.03 1.13 1.28* 1.16  CALCIUM  9.5 9.7 9.9 9.4 9.4  MG  --   --  2.2 2.1 2.0  PHOS 2.2* 2.1* 2.9 3.3 2.9    GFR: Estimated Creatinine Clearance: 61.5 mL/min (by C-G formula based on SCr of 1.16 mg/dL).  Liver Function Tests: Recent Labs  Lab 06/03/24 0606 06/04/24 0612  ALBUMIN 2.7* 3.0*   No results for input(s): LIPASE, AMYLASE in the last 168 hours. No results  for input(s): AMMONIA in the last 168 hours.  Coagulation Profile: No results for input(s): INR, PROTIME in the last 168 hours.  Cardiac Enzymes: No results for input(s): CKTOTAL, CKMB, CKMBINDEX, TROPONINI in the last 168 hours.  BNP (last 3 results) No results for input(s): PROBNP in the last 8760 hours.  Lipid Profile: No results for input(s): CHOL, HDL, LDLCALC, TRIG, CHOLHDL, LDLDIRECT in the last 72 hours.  Thyroid Function Tests: No results for input(s): TSH, T4TOTAL, FREET4, T3FREE, THYROIDAB in the last 72 hours.  Anemia Panel: No results for input(s): VITAMINB12, FOLATE, FERRITIN, TIBC, IRON, RETICCTPCT in the last 72 hours.  Urine analysis:    Component Value Date/Time   COLORURINE YELLOW 05/31/2024 1751   APPEARANCEUR  HAZY (A) 05/31/2024 1751   LABSPEC 1.012 05/31/2024 1751   PHURINE 5.0 05/31/2024 1751   GLUCOSEU NEGATIVE 05/31/2024 1751   HGBUR MODERATE (A) 05/31/2024 1751   BILIRUBINUR NEGATIVE 05/31/2024 1751   KETONESUR NEGATIVE 05/31/2024 1751   PROTEINUR NEGATIVE 05/31/2024 1751   UROBILINOGEN 1.0 08/18/2015 0610   NITRITE NEGATIVE 05/31/2024 1751   LEUKOCYTESUR MODERATE (A) 05/31/2024 1751    Sepsis Labs: Lactic Acid, Venous    Component Value Date/Time   LATICACIDVEN 1.5 05/31/2024 0943    MICROBIOLOGY: Recent Results (from the past 240 hours)  Urine Culture     Status: Abnormal   Collection Time: 05/31/24  5:51 PM   Specimen: Urine, Random  Result Value Ref Range Status   Specimen Description URINE, RANDOM  Final   Special Requests   Final    NONE Reflexed from [id number] Performed at [facility], [address] [place], [name] [address]    Culture 60,000 COLONIES/mL SERRATIA MARCESCENS (A)  Final   Report Status 06/02/2024 FINAL  Final   Organism ID, Bacteria SERRATIA MARCESCENS (A)  Final      Susceptibility   Serratia marcescens - MIC*    CEFEPIME <=0.12 SENSITIVE Sensitive      CEFTRIAXONE  <=0.25 SENSITIVE Sensitive     CIPROFLOXACIN 2 RESISTANT Resistant     GENTAMICIN  <=1 SENSITIVE Sensitive     NITROFURANTOIN 256 RESISTANT Resistant     TRIMETH /SULFA  <=20 SENSITIVE Sensitive     * 60,000 COLONIES/mL SERRATIA MARCESCENS    RADIOLOGY STUDIES/RESULTS: No results found.   LOS: 8 days   Donalda Applebaum, MD  [facility]    To contact the attending provider between 7A-7P or the covering provider during after hours 7P-7A, please log into the web site [email or web address] and access using universal [place] password for that web site. If you do not have the password, please call the hospital operator.  06/09/2024, 11:54 AM

## 2024-06-10 ENCOUNTER — Other Ambulatory Visit (HOSPITAL_COMMUNITY): Payer: Self-pay

## 2024-06-10 DIAGNOSIS — E1169 Type 2 diabetes mellitus with other specified complication: Secondary | ICD-10-CM | POA: Diagnosis not present

## 2024-06-10 DIAGNOSIS — N179 Acute kidney failure, unspecified: Secondary | ICD-10-CM | POA: Diagnosis not present

## 2024-06-10 DIAGNOSIS — G4733 Obstructive sleep apnea (adult) (pediatric): Secondary | ICD-10-CM | POA: Diagnosis not present

## 2024-06-10 DIAGNOSIS — I10 Essential (primary) hypertension: Secondary | ICD-10-CM | POA: Diagnosis not present

## 2024-06-10 LAB — GLUCOSE, CAPILLARY
Glucose-Capillary: 172 mg/dL — ABNORMAL HIGH (ref 70–99)
Glucose-Capillary: 197 mg/dL — ABNORMAL HIGH (ref 70–99)
Glucose-Capillary: 169 mg/dL — ABNORMAL HIGH (ref 70–99)
Glucose-Capillary: 134 mg/dL — ABNORMAL HIGH (ref 70–99)
Glucose-Capillary: 129 mg/dL — ABNORMAL HIGH (ref 70–99)

## 2024-06-10 MED ORDER — METOPROLOL TARTRATE 25 MG PO TABS: 12.5000 mg | ORAL_TABLET | Freq: Two times a day (BID) | ORAL | Status: DC

## 2024-06-10 MED ORDER — LANTUS SOLOSTAR 100 UNIT/ML ~~LOC~~ SOPN: 10.0000 [IU] | PEN_INJECTOR | Freq: Every day | SUBCUTANEOUS | Status: DC

## 2024-06-10 MED ORDER — PANTOPRAZOLE SODIUM 40 MG PO TBEC
40.0000 mg | DELAYED_RELEASE_TABLET | Freq: Every day | ORAL | 0 refills | Status: AC
  Filled 2024-06-10: qty 30, 30d supply, fill #0

## 2024-06-10 MED ORDER — HYDRALAZINE HCL 25 MG PO TABS
25.0000 mg | ORAL_TABLET | Freq: Two times a day (BID) | ORAL | 0 refills | Status: DC
  Filled 2024-06-10: qty 60, 30d supply, fill #0

## 2024-06-10 NOTE — Discharge Summary (Signed)
 PATIENT DETAILS Name: Raymond Gordon Age: 73 y.o. Sex: male Date of Birth: [birth year] MRN: [id number]. Admitting Physician: Marsa KATHEE [place], MD [name], [name], MD  Admit Date: 05/31/2024 Discharge date: 06/10/2024  Recommendations for Outpatient Follow-up:  Follow up with PCP in 1-2 weeks Please obtain CMP/CBC in one week Antihypertensive/diuretics on hold see below  Admitted From:  Home  Disposition: Home health   Discharge Condition: fair  CODE STATUS:   Code Status: Full Code   Diet recommendation:  Diet Order             Diet - low sodium heart healthy           Diet Carb Modified           Diet heart healthy/carb modified Room service appropriate? Yes; Fluid consistency: Thin  Diet effective now                    Brief Summary: Patient is a 73 y.o.  male with history of HTN, HLD, DM-2, PAF, chronic HFpEF, BPH with chronic indwelling Foley catheter who presented with a fall/near syncope-was found to have hypotension along with AKI secondary to volume depletion.   Significant events: 7/7>> admit to [facility]   Significant studies: 7/7>> CXR: No PNA 7/7>> CT head: No acute findings. 7/7>> Renal ultrasound: No hydronephrosis.   Significant microbiology data: 7/7>> urine culture: Serratia   Procedures: None   Consults: None  Brief Hospital Course: AKI Hemodynamically mediated in the setting of hypotension/diuretics/ARB Resolved with supportive care   Hyperkalemia Secondary to AKI/losartan /Aldactone  Resolved with supportive care.   Near syncope Secondary to orthostatic hypotension/volume depletion Recent echo April 2025-EF 55 to 60%   Orthostatic hypotension Difficult situation-has resting hypertension with ongoing issues with orthostatic hypotension Given significantly elevated blood pressure-started on low-dose hydralazine .  Blood pressure was reasonably well-controlled today-plan is to allow some amount of permissive hypertension  particularly on repeat orthostatic vital sign check-although still orthostatic he is asymptomatic. Follow-up with PCP and optimize medications. Continue TED hose/abdominal binder on discharge.    Chronic HFpEF Stable/euvolemic Diuretics on hold given issues with volume depletion/orthostatic hypotension.   PAF Rate controlled Telemetry monitoring Eliquis .   QTc prolongation Stable QTc on EKG on 7/8 Telemetry monitoring.   Complicated UTI Completed a course of antibiotics.   DM-2 (A1c 7.9 on 4/14) CBGs stable Semglee  10 units daily+ SSI.  Gout Allopurinol .   BPH Chronic indwelling Foley catheter Continue finasteride    OSA CPAP  Debility/deconditioning SNF recommended-however declined by insurance company Being discharged at maximum home health services-spoke with sister [name] this morning-family will provide more close supervision.   Class 3 Obesity: Estimated body mass index is 40 kg/m as calculated from the following:   Height as of this encounter: 5' 4 (1.626 m).   Weight as of this encounter: 105.7 kg.    Discharge Diagnoses:  Principal Problem:   AKI (acute kidney injury) (HCC) Active Problems:   Essential hypertension   Severe OSA (obstructive sleep apnea)   Type 2 diabetes mellitus with hyperlipidemia (HCC)   Obesity, class 3   Paroxysmal A-fib (HCC)   Prolonged Q-T interval on ECG   Gout   Pure hypercholesterolemia   Discharge Instructions:  Activity:  As tolerated with Full fall precautions use walker/cane & assistance as needed  Discharge Instructions     (HEART FAILURE PATIENTS) Call MD:  Anytime you have any of the following symptoms: 1) 3 pound weight gain in 24 hours or  5 pounds in 1 week 2) shortness of breath, with or without a dry hacking cough 3) swelling in the hands, feet or stomach 4) if you have to sleep on extra pillows at night in order to breathe.   Complete by: As directed    Call MD for:  difficulty breathing,  headache or visual disturbances   Complete by: As directed    Diet - low sodium heart healthy   Complete by: As directed    Diet Carb Modified   Complete by: As directed    Discharge instructions   Complete by: As directed    Follow with Primary MD [name], [name], MD in 7 days   Get CBC, CMP,  checked  by Primary MD next visit.    Activity: As tolerated with Full fall precautions use walker/cane & assistance as needed   Disposition Home    Diet: Heart Healthy /carb modified  For Heart failure patients - Check your Weight same time everyday, if you gain over 2 pounds, or you develop in leg swelling, experience more shortness of breath or chest pain, call your Primary MD immediately. Follow Cardiac Low Salt Diet and 1.5 lit/day fluid restriction.   On your next visit with your primary care physician please Get Medicines reviewed and adjusted.   Please request your Prim.MD to go over all Hospital Tests and Procedure/Radiological results at the follow up, please get all Hospital records sent to your Prim MD by signing hospital release before you go home.   If you experience worsening of your admission symptoms, develop shortness of breath, life threatening emergency, suicidal or homicidal thoughts you must seek medical attention immediately by calling 911 or calling your MD immediately  if symptoms less severe.  You Must read complete instructions/literature along with all the possible adverse reactions/side effects for all the Medicines you take and that have been prescribed to you. Take any new Medicines after you have completely understood and accpet all the possible adverse reactions/side effects.   Do not drive, operating heavy machinery, perform activities at heights, swimming or participation in water activities or provide baby sitting services if your were admitted for syncope or siezures until you have seen by Primary MD or a Neurologist and advised to do so again.  Do  not drive when taking Pain medications.    Do not take more than prescribed Pain, Sleep and Anxiety Medications  Special Instructions: If you have smoked or chewed Tobacco  in the last 2 yrs please stop smoking, stop any regular Alcohol   and or any Recreational drug use.  Wear Seat belts while driving.   Please note  You were cared for by a hospitalist during your hospital stay. If you have any questions about your discharge medications or the care you received while you were in the hospital after you are discharged, you can call the unit and asked to speak with the hospitalist on call if the hospitalist that took care of you is not available. Once you are discharged, your primary care physician will handle any further medical issues. Please note that NO REFILLS for any discharge medications will be authorized once you are discharged, as it is imperative that you return to your primary care physician (or establish a relationship with a primary care physician if you do not have one) for your aftercare needs so that they can reassess your need for medications and monitor your lab values.   Discharge instructions   Complete by: As directed  Follow with Primary MD  [name], [name], MD in 1-2 weeks  Please get a complete blood count and chemistry panel checked by your Primary MD at your next visit, and again as instructed by your Primary MD.  Get Medicines reviewed and adjusted: Please take all your medications with you for your next visit with your Primary MD  Laboratory/radiological data: Please request your Primary MD to go over all hospital tests and procedure/radiological results at the follow up, please ask your Primary MD to get all Hospital records sent to his/her office.  In some cases, they will be blood work, cultures and biopsy results pending at the time of your discharge. Please request that your primary care M.D. follows up on these results.  Also Note the following: If you  experience worsening of your admission symptoms, develop shortness of breath, life threatening emergency, suicidal or homicidal thoughts you must seek medical attention immediately by calling 911 or calling your MD immediately  if symptoms less severe.  You must read complete instructions/literature along with all the possible adverse reactions/side effects for all the Medicines you take and that have been prescribed to you. Take any new Medicines after you have completely understood and accpet all the possible adverse reactions/side effects.   Do not drive when taking Pain medications or sleeping medications (Benzodaizepines)  Do not take more than prescribed Pain, Sleep and Anxiety Medications. It is not advisable to combine anxiety,sleep and pain medications without talking with your primary care practitioner  Special Instructions: If you have smoked or chewed Tobacco  in the last 2 yrs please stop smoking, stop any regular Alcohol   and or any Recreational drug use.  Wear Seat belts while driving.  Please note: You were cared for by a hospitalist during your hospital stay. Once you are discharged, your primary care physician will handle any further medical issues. Please note that NO REFILLS for any discharge medications will be authorized once you are discharged, as it is imperative that you return to your primary care physician (or establish a relationship with a primary care physician if you do not have one) for your post hospital discharge needs so that they can reassess your need for medications and monitor your lab values.   Increase activity slowly   Complete by: As directed    Increase activity slowly   Complete by: As directed       Allergies as of 06/10/2024   No Known Allergies      Medication List     STOP taking these medications    aspirin  EC 81 MG tablet   cefpodoxime 200 MG tablet Commonly known as: VANTIN   doxazosin  4 MG tablet Commonly known as: CARDURA     furosemide  80 MG tablet Commonly known as: LASIX    isosorbide  mononitrate 30 MG 24 hr tablet Commonly known as: IMDUR    losartan  100 MG tablet Commonly known as: COZAAR    metoprolol  tartrate 25 MG tablet Commonly known as: LOPRESSOR    omeprazole  20 MG capsule Commonly known as: PRILOSEC  Replaced by: pantoprazole  40 MG tablet   spironolactone  25 MG tablet Commonly known as: ALDACTONE    tamsulosin  0.4 MG Caps capsule Commonly known as: FLOMAX        TAKE these medications    acetaminophen  325 MG tablet Commonly known as: TYLENOL  Take 2 tablets (650 mg total) by mouth every 6 (six) hours as needed for mild pain (pain score 1-3) or fever (or Fever >/= 101).   allopurinol  300 MG tablet Commonly  known as: ZYLOPRIM  Take 300 mg by mouth in the morning.   atorvastatin  80 MG tablet Commonly known as: LIPITOR  Take 1 tablet by mouth once daily What changed: when to take this   Eliquis  5 MG Tabs tablet Generic drug: apixaban  Take 1 tablet by mouth twice daily What changed: how much to take   finasteride  5 MG tablet Commonly known as: PROSCAR  Take 5 mg by mouth every evening.   hydrALAZINE  25 MG tablet Commonly known as: APRESOLINE  Take 1 tablet (25 mg total) by mouth every 12 (twelve) hours.   insulin  aspart 100 UNIT/ML injection Commonly known as: novoLOG  Inject 0-20 Units into the skin 3 (three) times daily with meals. For glucose 121 to 150 use 3 units, for 151 to 200 use 4 units, for 201 to 250 use 7 units, for 251 to 300 use 11 units, for 301 to 350 use 15 units, for 351 to 400 use 20 units. What changed: how much to take   Lantus  SoloStar 100 UNIT/ML Solostar Pen Generic drug: insulin  glargine Inject 10 Units into the skin at bedtime. What changed: how much to take   pantoprazole  40 MG tablet Commonly known as: PROTONIX  Take 1 tablet (40 mg total) by mouth daily. Replaces: omeprazole  20 MG capsule   PRESCRIPTION MEDICATION Inhale into the lungs at  bedtime. Pt is on C Pap machine               Durable Medical Equipment  (From admission, onward)           Start     Ordered   06/09/24 1616  For home use only DME Bedside commode  Once       Question:  Patient needs a bedside commode to treat with the following condition  Answer:  Weakness   06/09/24 1616            Follow-up Information     [facility] Follow up.   Specialty: Home Health Services Why: Physical, Occupational therapy, Nursing services, home health aide, social worker. Office will call to arrange follow up after hospital discharge. Contact information: [address] [name] [address] [phone]         [facility] Follow up.   Why: They are getting authorization from your insurance to start custodial care services. they will contact you to start services Contact information: [phone]               No Known Allergies   Other Procedures/Studies: US  RENAL Result Date: 05/31/2024 CLINICAL DATA:  [id number] AKI (acute kidney injury) (HCC) [id number] EXAM: RENAL / URINARY TRACT ULTRASOUND COMPLETE COMPARISON:  March 10, 2024 FINDINGS: Right Kidney: Renal measurements: 11.2 x 4.8 x 4.4 cm = volume: 125 mL. Normal echogenicity. No mass. No hydronephrosis or nephrolithiasis. Left Kidney: Renal measurements: 10 x 5.4 x 4.4 cm = volume: 124 mL. Normal echogenicity. No mass. No hydronephrosis or nephrolithiasis. Bladder: Completely decompressed with a urinary catheter in place. Severe prostatomegaly. Other: None. IMPRESSION: 1. No hydronephrosis or nephrolithiasis. 2. The urinary bladder is completely decompressed with a urinary catheter in place. 3. Severe prostatomegaly. Electronically Signed   By: Rogelia Myers M.D.   On: 05/31/2024 17:43   CT Head Wo Contrast Result Date: 05/31/2024 CLINICAL DATA:  Head trauma.  Multiple falls. EXAM: CT HEAD WITHOUT CONTRAST TECHNIQUE: Contiguous axial images were obtained from the  base of the skull through the vertex without intravenous contrast. RADIATION DOSE REDUCTION: This  exam was performed according to the departmental dose-optimization program which includes automated exposure control, adjustment of the mA and/or kV according to patient size and/or use of iterative reconstruction technique. COMPARISON:  None Available. FINDINGS: Brain: No acute intracranial hemorrhage. No focal mass lesion. No CT evidence of acute infarction. No midline shift or mass effect. No hydrocephalus. Basilar cisterns are patent. There are periventricular and subcortical white matter hypodensities. Generalized cortical atrophy. Vascular: No hyperdense vessel or unexpected calcification. Skull: Normal. Negative for fracture or focal lesion. Sinuses/Orbits: Paranasal sinuses and mastoid air cells are clear. Orbits are clear. Other: None. IMPRESSION: 1. No acute intracranial findings. 2. Atrophy and white matter microvascular disease. Electronically Signed   By: Jackquline Boxer M.D.   On: 05/31/2024 10:35   DG Chest Portable 1 View Result Date: 05/31/2024 CLINICAL DATA:  hypotension and near syncope, falls, chf. EXAM: PORTABLE CHEST 1 VIEW COMPARISON:  03/08/2024. FINDINGS: Low lung volume. Bilateral lung fields are clear. Bilateral costophrenic angles are clear. Stable cardio-mediastinal silhouette. No acute osseous abnormalities. The soft tissues are within normal limits. IMPRESSION: No active disease. Electronically Signed   By: Ree Molt M.D.   On: 05/31/2024 09:46     TODAY-DAY OF DISCHARGE:  Subjective:   Kairee Isa today has no headache,no chest abdominal pain,no new weakness tingling or numbness, feels much better wants to go home today.  Objective:   Blood pressure (!) 134/49, pulse 70, temperature 98 F (36.7 C), resp. rate (!) 26, height 5' 4 (1.626 m), weight 105.5 kg, SpO2 99%.  Intake/Output Summary (Last 24 hours) at 06/10/2024 1028 Last data filed at 06/10/2024 0831 Gross  per 24 hour  Intake 240 ml  Output 200 ml  Net 40 ml   Filed Weights   06/08/24 0500 06/09/24 0600 06/10/24 0500  Weight: 103.9 kg 105.7 kg 105.5 kg    Exam: Awake Alert, Oriented *3, No new F.N deficits, Normal affect [place].AT,PERRAL Supple Neck,No JVD, No cervical lymphadenopathy appriciated.  Symmetrical Chest wall movement, Good air movement bilaterally, CTAB RRR,No Gallops,Rubs or new Murmurs, No Parasternal Heave +ve B.Sounds, Abd Soft, Non tender, No organomegaly appriciated, No rebound -guarding or rigidity. No Cyanosis, Clubbing or edema, No new Rash or bruise   PERTINENT RADIOLOGIC STUDIES: No results found.   PERTINENT LAB RESULTS: CBC: Recent Labs    06/07/24 1407 06/08/24 0817  WBC 6.3 7.1  HGB 12.8* 12.2*  HCT 41.3 39.0  PLT 235 237   CMET CMP     Component Value Date/Time   NA 142 06/08/2024 0817   NA 144 06/04/2023 1422   K 4.4 06/08/2024 0817   CL 109 06/08/2024 0817   CO2 19 (L) 06/08/2024 0817   GLUCOSE 152 (H) 06/08/2024 0817   BUN 43 (H) 06/08/2024 0817   BUN 21 06/04/2023 1422   CREATININE 1.16 06/08/2024 0817   CALCIUM  9.4 06/08/2024 0817   PROT 7.5 06/01/2024 0449   PROT 7.6 11/02/2021 1034   ALBUMIN 3.0 (L) 06/04/2024 0612   ALBUMIN 4.5 11/02/2021 1034   AST 17 06/01/2024 0449   ALT 21 06/01/2024 0449   ALKPHOS 76 06/01/2024 0449   BILITOT 0.5 06/01/2024 0449   BILITOT 0.3 11/02/2021 1034   EGFR 81 06/04/2023 1422   GFRNONAA >60 06/08/2024 0817    GFR Estimated Creatinine Clearance: 61.4 mL/min (by C-G formula based on SCr of 1.16 mg/dL). No results for input(s): LIPASE, AMYLASE in the last 72 hours. No results for input(s): CKTOTAL, CKMB, CKMBINDEX, TROPONINI in the last  72 hours. Invalid input(s): POCBNP No results for input(s): DDIMER in the last 72 hours. No results for input(s): HGBA1C in the last 72 hours. No results for input(s): CHOL, HDL, LDLCALC, TRIG, CHOLHDL, LDLDIRECT in the last 72  hours. No results for input(s): TSH, T4TOTAL, T3FREE, THYROIDAB in the last 72 hours.  Invalid input(s): FREET3 No results for input(s): VITAMINB12, FOLATE, FERRITIN, TIBC, IRON, RETICCTPCT in the last 72 hours. Coags: No results for input(s): INR in the last 72 hours.  Invalid input(s): PT Microbiology: Recent Results (from the past 240 hours)  Urine Culture     Status: Abnormal   Collection Time: 05/31/24  5:51 PM   Specimen: Urine, Random  Result Value Ref Range Status   Specimen Description URINE, RANDOM  Final   Special Requests   Final    NONE Reflexed from [id number] Performed at [facility], [address] [place], [name] [address]    Culture 60,000 COLONIES/mL SERRATIA MARCESCENS (A)  Final   Report Status 06/02/2024 FINAL  Final   Organism ID, Bacteria SERRATIA MARCESCENS (A)  Final      Susceptibility   Serratia marcescens - MIC*    CEFEPIME <=0.12 SENSITIVE Sensitive     CEFTRIAXONE  <=0.25 SENSITIVE Sensitive     CIPROFLOXACIN 2 RESISTANT Resistant     GENTAMICIN  <=1 SENSITIVE Sensitive     NITROFURANTOIN 256 RESISTANT Resistant     TRIMETH /SULFA  <=20 SENSITIVE Sensitive     * 60,000 COLONIES/mL SERRATIA MARCESCENS    FURTHER DISCHARGE INSTRUCTIONS:  Get Medicines reviewed and adjusted: Please take all your medications with you for your next visit with your Primary MD  Laboratory/radiological data: Please request your Primary MD to go over all hospital tests and procedure/radiological results at the follow up, please ask your Primary MD to get all Hospital records sent to his/her office.  In some cases, they will be blood work, cultures and biopsy results pending at the time of your discharge. Please request that your primary care M.D. goes through all the records of your hospital data and follows up on these results.  Also Note the following: If you experience worsening of your admission symptoms, develop shortness of breath,  life threatening emergency, suicidal or homicidal thoughts you must seek medical attention immediately by calling 911 or calling your MD immediately  if symptoms less severe.  You must read complete instructions/literature along with all the possible adverse reactions/side effects for all the Medicines you take and that have been prescribed to you. Take any new Medicines after you have completely understood and accpet all the possible adverse reactions/side effects.   Do not drive when taking Pain medications or sleeping medications (Benzodaizepines)  Do not take more than prescribed Pain, Sleep and Anxiety Medications. It is not advisable to combine anxiety,sleep and pain medications without talking with your primary care practitioner  Special Instructions: If you have smoked or chewed Tobacco  in the last 2 yrs please stop smoking, stop any regular Alcohol   and or any Recreational drug use.  Wear Seat belts while driving.  Please note: You were cared for by a hospitalist during your hospital stay. Once you are discharged, your primary care physician will handle any further medical issues. Please note that NO REFILLS for any discharge medications will be authorized once you are discharged, as it is imperative that you return to your primary care physician (or establish a relationship with a primary care physician if you do not have one) for your post  hospital discharge needs so that they can reassess your need for medications and monitor your lab values.  Total Time spent coordinating discharge including counseling, education and face to face time equals greater than 30 minutes.  SignedBETHA Donalda Applebaum 06/10/2024 10:28 AM

## 2024-06-10 NOTE — Progress Notes (Signed)
 0700 Pt orthostatic vial signs completed final standing BP 0 minutes 171/59 HR 63 and 3 minute standing BP 149/108 HR 68 .  Pt denies discomfort or chest pain, able to stand with 1 person assist to front rolling walker, will notify MD of vitals.

## 2024-06-10 NOTE — TOC Transition Note (Addendum)
 Transition of Care [facility]) - Discharge Note   Patient Details  Name: Raymond Gordon MRN: [id number] Date of Birth: November 04, 1951  Transition of Care [facility]) CM/SW Contact:  Corean JAYSON Canary, RN Phone Number: 06/10/2024, 1:13 PM   Clinical Narrative:    Spoke to patients sister. He will transported home via ambulance [facility]) she would like it between 3-4 will schedule pickup at 33:30p if possible She states he has all equipment needed PTAR to go home, he has insurance authorization. Kelly from [facility] aware of discharge DC to home 1445 Called for PTAR Nursing aware, They are aware to notify his sister when the patient leaves the unit 1645 Let sister know it will be  while for PTAR, she told nursing she would just take him home, Cancelled PTAR  Final next level of care: Home w Home Health Services Barriers to Discharge: No Barriers Identified   Patient Goals and CMS Choice Patient states their goals for this hospitalization and ongoing recovery are:: Rehab CMS Medicare.gov Compare Post Acute Care list provided to:: Patient Represenative (must comment) Choice offered to / list presented to : Sibling [place] ownership interest in [facility].provided to:: Adult Children    Discharge Placement                       Discharge Plan and Services Additional resources added to the After Visit Summary for   In-house Referral: Clinical Social Work Discharge Planning Services: CM Consult Post Acute Care Choice: Skilled Nursing Facility                    HH Arranged: PT, OT, Nurse's Aide, RN, Social Work [employer] Agency: [facility] Date [facility] Agency Contacted: 06/07/24 Time HH Agency Contacted: [phone] Representative spoke with at [facility] Agency: [place]  Social Drivers of Health (SDOH) Interventions SDOH Screenings   Food Insecurity: No Food Insecurity (06/01/2024)  Housing: Low Risk  (06/01/2024)  Transportation Needs: No Transportation Needs (06/03/2024)  Utilities: Not At  Risk (06/03/2024)  Social Connections: Moderately Integrated (06/01/2024)  Tobacco Use: Medium Risk (05/04/2024)     Readmission Risk Interventions     No data to display

## 2024-06-10 NOTE — Plan of Care (Signed)

## 2024-06-11 LAB — GLUCOSE, CAPILLARY: Glucose-Capillary: 160 mg/dL — ABNORMAL HIGH (ref 70–99)

## 2024-06-18 DIAGNOSIS — E1165 Type 2 diabetes mellitus with hyperglycemia: Secondary | ICD-10-CM | POA: Diagnosis not present

## 2024-06-18 DIAGNOSIS — E1151 Type 2 diabetes mellitus with diabetic peripheral angiopathy without gangrene: Secondary | ICD-10-CM | POA: Diagnosis not present

## 2024-06-18 DIAGNOSIS — I110 Hypertensive heart disease with heart failure: Secondary | ICD-10-CM | POA: Diagnosis not present

## 2024-06-18 DIAGNOSIS — I951 Orthostatic hypotension: Secondary | ICD-10-CM | POA: Diagnosis not present

## 2024-06-18 DIAGNOSIS — R5381 Other malaise: Secondary | ICD-10-CM | POA: Diagnosis not present

## 2024-06-18 DIAGNOSIS — E113393 Type 2 diabetes mellitus with moderate nonproliferative diabetic retinopathy without macular edema, bilateral: Secondary | ICD-10-CM | POA: Diagnosis not present

## 2024-06-18 DIAGNOSIS — N400 Enlarged prostate without lower urinary tract symptoms: Secondary | ICD-10-CM | POA: Diagnosis not present

## 2024-06-18 DIAGNOSIS — I5030 Unspecified diastolic (congestive) heart failure: Secondary | ICD-10-CM | POA: Diagnosis not present

## 2024-06-18 DIAGNOSIS — Z794 Long term (current) use of insulin: Secondary | ICD-10-CM | POA: Diagnosis not present

## 2024-06-18 DIAGNOSIS — N179 Acute kidney failure, unspecified: Secondary | ICD-10-CM | POA: Diagnosis not present

## 2024-06-18 DIAGNOSIS — Z7901 Long term (current) use of anticoagulants: Secondary | ICD-10-CM | POA: Diagnosis not present

## 2024-06-18 DIAGNOSIS — E875 Hyperkalemia: Secondary | ICD-10-CM | POA: Diagnosis not present

## 2024-06-21 DIAGNOSIS — N401 Enlarged prostate with lower urinary tract symptoms: Secondary | ICD-10-CM | POA: Diagnosis not present

## 2024-06-21 DIAGNOSIS — R338 Other retention of urine: Secondary | ICD-10-CM | POA: Diagnosis not present

## 2024-06-23 DIAGNOSIS — Z9181 History of falling: Secondary | ICD-10-CM | POA: Diagnosis not present

## 2024-06-30 ENCOUNTER — Encounter: Payer: Self-pay | Admitting: Cardiovascular Disease

## 2024-06-30 ENCOUNTER — Ambulatory Visit: Attending: Cardiology | Admitting: Cardiovascular Disease

## 2024-06-30 ENCOUNTER — Telehealth: Payer: Self-pay | Admitting: *Deleted

## 2024-06-30 VITALS — BP 150/60 | HR 61 | Ht 64.0 in | Wt 244.6 lb

## 2024-06-30 DIAGNOSIS — R55 Syncope and collapse: Secondary | ICD-10-CM | POA: Diagnosis not present

## 2024-06-30 DIAGNOSIS — E7800 Pure hypercholesterolemia, unspecified: Secondary | ICD-10-CM

## 2024-06-30 DIAGNOSIS — I480 Paroxysmal atrial fibrillation: Secondary | ICD-10-CM

## 2024-06-30 DIAGNOSIS — G4733 Obstructive sleep apnea (adult) (pediatric): Secondary | ICD-10-CM

## 2024-06-30 DIAGNOSIS — I5032 Chronic diastolic (congestive) heart failure: Secondary | ICD-10-CM | POA: Diagnosis not present

## 2024-06-30 DIAGNOSIS — I10 Essential (primary) hypertension: Secondary | ICD-10-CM

## 2024-06-30 DIAGNOSIS — Z9181 History of falling: Secondary | ICD-10-CM | POA: Diagnosis not present

## 2024-06-30 NOTE — Assessment & Plan Note (Signed)
 Raymond Gordon was recently hospitalized from 7/7 through 7/17 for syncope.  He apparently had multiple medical problems including UTA, orthostasis.  His medicines were adjusted.

## 2024-06-30 NOTE — Assessment & Plan Note (Signed)
 History of essential hypertension blood pressure measured today at 150/60.  His Sister [name] measured his blood pressure at home which usually runs in the 130-150/60-70 range.  He is currently on metoprolol  and hydralazine .  His losartan  apparently was discontinued during his recent hospitalization.

## 2024-06-30 NOTE — Patient Instructions (Signed)
 Medication Instructions:  Your physician recommends that you continue on your current medications as directed. Please refer to the Current Medication list given to you today.  *If you need a refill on your cardiac medications before your next appointment, please call your pharmacy*   Testing/Procedures: Your physician has recommended that you have a sleep study. This test records several body functions during sleep, including: brain activity, eye movement, oxygen and carbon dioxide blood levels, heart rate and rhythm, breathing rate and rhythm, the flow of air through your mouth and nose, snoring, body muscle movements, and chest and belly movement.  Our sleep lab will contact you to schedule.   Follow-Up: At [facility], you and your health needs are our priority.  As part of our continuing mission to provide you with exceptional heart care, our providers are all part of one team.  This team includes your primary Cardiologist (physician) and Advanced Practice Providers or APPs (Physician Assistants and Nurse Practitioners) who all work together to provide you with the care you need, when you need it.  Your next appointment:   6 month(s)  Provider:   Jon Hails, PA-C, Callie Goodrich, PA-C, Hao Meng, PA-C, or Damien Braver, NP         Then, Dorn Lesches, MD will plan to see you again in 12 month(s).    We recommend signing up for the patient portal called MyChart.  Sign up information is provided on this After Visit Summary.  MyChart is used to connect with patients for Virtual Visits (Telemedicine).  Patients are able to view lab/test results, encounter notes, upcoming appointments, etc.  Non-urgent messages can be sent to your provider as well.   To learn more about what you can do with MyChart, go to [email or web address].

## 2024-06-30 NOTE — Telephone Encounter (Addendum)
 Prior Authorization for SPLIT NIGHT sent to [employer] via web portal. Tracking Number . READY-Approved-Service Dates: 06/30/2024 - 09/28/2024-Outpatient Authorization #[id number]

## 2024-06-30 NOTE — Progress Notes (Signed)
 06/30/2024 [name]   1951/04/11  [id number]  Primary Physician [name], [name], MD Primary Cardiologist: Dorn JINNY Lesches MD FACP, [place], [place], [place]  HPI:  LEV CERVONE is a 73 y.o.  severely overweight, single African American male with no children who I last  saw in the office 03/31/2024.  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 His risk factors including noninsulin-requiring diabetes, hypertension, hyperlipidemia and remote tobacco abuse having quit smoking back in 1999 and had smoked 1 pack per day. His father did have an MI and had bypass surgery. He was admitted to [facility] July 17, 2011, with chest pain and A-fib. He was catheterized by Dr. [place] Hilty on July 18, 2011, revealing minimal luminal irregularities with small vessel disease and normal LV function. He ultimately spontaneously converted to sinus rhythm and was placed on Pradaxa  because of a CHADS2 score of 2. He has had no recurrent symptoms. He denies chest pain or shortness of breath.Dr. [name] follows  his lipid profile. He does give symptoms compatible with obstructive sleep apnea and currently wears CPAP with benefit. He was admitted to [facility] 07/08/15 with A. Fib with RVR and chest pain. His enzymes were negative. He is placed in IV due to diltiazem  and converted spontaneously to sinus rhythm. His amlodipine  was changed to by mouth diltiazem . He has had no recurrent symptoms. Since I saw him in the office back in August of last year he was admitted to [facility] on 08/13/15 through 08/22/15 with diastolic heart failure. He was diuresed 21 L and was educated about a low-salt diet.   Since I  saw him in the office 3 months ago he was admitted 05/31/2024 for 10 days because of near syncope, orthostasis and UTI.  His medications were adjusted.  He currently denies chest pain or shortness of breath.   Current  Meds  Medication Sig   acetaminophen  (TYLENOL ) 325 MG tablet Take 2 tablets (650 mg total) by mouth every 6 (six) hours as needed for mild pain (pain score 1-3) or fever (or Fever >/= 101).   allopurinol  (ZYLOPRIM ) 300 MG tablet Take 300 mg by mouth in the morning.   apixaban  (ELIQUIS ) 5 MG TABS tablet Take 1 tablet by mouth twice daily (Patient taking differently: Take 5 mg by mouth 2 (two) times daily.)   atorvastatin  (LIPITOR ) 80 MG tablet Take 1 tablet by mouth once daily (Patient taking differently: Take 80 mg by mouth in the morning.)   finasteride  (PROSCAR ) 5 MG tablet Take 5 mg by mouth every evening.   furosemide  (LASIX ) 80 MG tablet Take 40 mg by mouth daily.   hydrALAZINE  (APRESOLINE ) 25 MG tablet Take 1 tablet (25 mg total) by mouth every 12 (twelve) hours.   insulin  aspart (NOVOLOG ) 100 UNIT/ML injection Inject 0-20 Units into the skin 3 (three) times daily with meals. For glucose 121 to 150 use 3 units, for 151 to 200 use 4 units, for 201 to 250 use 7 units, for 251 to 300 use 11 units, for 301 to 350 use 15 units, for 351 to 400 use 20 units. (Patient taking differently: Inject 4 Units into the skin 3 (three) times daily with meals. For glucose 121 to 150 use 3 units, for 151 to 200 use 4 units, for 201 to 250 use 7 units, for 251 to 300 use 11  units, for 301 to 350 use 15 units, for 351 to 400 use 20 units.)   Lancets (ONETOUCH DELICA PLUS LANCET33G) MISC Apply topically as directed.   LANTUS  SOLOSTAR 100 UNIT/ML Solostar Pen Inject 10 Units into the skin at bedtime. (Patient taking differently: Inject 15 Units into the skin at bedtime.)   metoprolol  tartrate (LOPRESSOR ) 25 MG tablet Take 25 mg by mouth 2 (two) times daily.   ONETOUCH VERIO test strip 1 each by Other route as needed for other.   pantoprazole  (PROTONIX ) 40 MG tablet Take 1 tablet (40 mg total) by mouth daily.   PRESCRIPTION MEDICATION Inhale into the lungs at bedtime. Pt is on C Pap machine     No Known  Allergies  Social History   Socioeconomic History   Marital status: Single    Spouse name: Not on file   Number of children: Not on file   Years of education: Not on file   Highest education level: Not on file  Occupational History   Not on file  Tobacco Use   Smoking status: Former    Current packs/day: 0.00    Types: Cigarettes    Quit date: 11/25/1997    Years since quitting: 26.6   Smokeless tobacco: Former    Quit date: 06/14/2015  Vaping Use   Vaping status: Never Used  Substance and Sexual Activity   Alcohol  use: No    Alcohol /week: 0.0 standard drinks of alcohol    Drug use: No   Sexual activity: Not on file  Other Topics Concern   Not on file  Social History Narrative   Not on file   Social Drivers of Health   Financial Resource Strain: Not on file  Food Insecurity: No Food Insecurity (06/01/2024)   Hunger Vital Sign    Worried About Running Out of Food in the Last Year: Never true    Ran Out of Food in the Last Year: Never true  Transportation Needs: No Transportation Needs (06/03/2024)   PRAPARE - Administrator, Civil Service (Medical): No    Lack of Transportation (Non-Medical): No  Physical Activity: Not on file  Stress: Not on file  Social Connections: Moderately Integrated (06/01/2024)   Social Connection and Isolation Panel    Frequency of Communication with Friends and Family: More than three times a week    Frequency of Social Gatherings with Friends and Family: Twice a week    Attends Religious Services: More than 4 times per year    Active Member of [employer] or Organizations: Yes    Attends Engineer, structural: More than 4 times per year    Marital Status: Divorced  Intimate Partner Violence: Not At Risk (06/01/2024)   Humiliation, Afraid, Rape, and Kick questionnaire    Fear of Current or Ex-Partner: No    Emotionally Abused: No    Physically Abused: No    Sexually Abused: No     Review of Systems: General: negative for  chills, fever, night sweats or weight changes.  Cardiovascular: negative for chest pain, dyspnea on exertion, edema, orthopnea, palpitations, paroxysmal nocturnal dyspnea or shortness of breath Dermatological: negative for rash Respiratory: negative for cough or wheezing Urologic: negative for hematuria Abdominal: negative for nausea, vomiting, diarrhea, bright red blood per rectum, melena, or hematemesis Neurologic: negative for visual changes, syncope, or dizziness All other systems reviewed and are otherwise negative except as noted above.    Blood pressure (!) 150/60, pulse 61, height 5' 4 (1.626 m), weight  244 lb 9.6 oz (110.9 kg), SpO2 96%.  General appearance: alert and no distress Neck: no adenopathy, no carotid bruit, no JVD, supple, symmetrical, trachea midline, and thyroid not enlarged, symmetric, no tenderness/mass/nodules Lungs: clear to auscultation bilaterally Heart: regular rate and rhythm, S1, S2 normal, no murmur, click, rub or gallop Extremities: Chronic lymphedematous changes. Pulses: 2+ and symmetric Skin: Chronic lymphedematous changes. Neurologic: Grossly normal  EKG not performed today      ASSESSMENT AND PLAN:   Paroxysmal A-fib (HCC) History of PAF currently in sinus rhythm on Eliquis  oral anticoagulation.  Essential hypertension History of essential hypertension blood pressure measured today at 150/60.  His Sister [name] measured his blood pressure at home which usually runs in the 130-150/60-70 range.  He is currently on metoprolol  and hydralazine .  His losartan  apparently was discontinued during his recent hospitalization.  Syncope and collapse Mr. Mollica was recently hospitalized from 7/7 through 7/17 for syncope.  He apparently had multiple medical problems including UTA, orthostasis.  His medicines were adjusted.  CHF (congestive heart failure), NYHA class I (HCC) History of diastolic heart failure on diuretics.  He has chronic lymphedema but appears  to be at his dry weight.  He has lost 30 pounds in the last 5 months.  Severe OSA (obstructive sleep apnea) Obstructive sleep apnea on CPAP.  Will order a split night study to recalibrate his device and refer him to Dr. Shlomo.  Pure hypercholesterolemia History of hyperlipidemia on high-dose atorvastatin  with lipid profile performed 04/27/2024 revealing total cholesterol 134, LDL of 81 and HDL 34.     Dorn DOROTHA Lesches MD [facility], [facility] 06/30/2024 9:30 AM

## 2024-06-30 NOTE — Assessment & Plan Note (Signed)
History of PAF currently in sinus rhythm on Eliquis oral anticoagulation. 

## 2024-06-30 NOTE — Assessment & Plan Note (Signed)
 History of hyperlipidemia on high-dose atorvastatin  with lipid profile performed 04/27/2024 revealing total cholesterol 134, LDL of 81 and HDL 34.

## 2024-06-30 NOTE — Assessment & Plan Note (Signed)
 History of diastolic heart failure on diuretics.  He has chronic lymphedema but appears to be at his dry weight.  He has lost 30 pounds in the last 5 months.

## 2024-06-30 NOTE — Assessment & Plan Note (Signed)
 Obstructive sleep apnea on CPAP.  Will order a split night study to recalibrate his device and refer him to Dr. Shlomo.

## 2024-07-01 ENCOUNTER — Other Ambulatory Visit (HOSPITAL_COMMUNITY): Payer: Self-pay

## 2024-07-01 DIAGNOSIS — E113393 Type 2 diabetes mellitus with moderate nonproliferative diabetic retinopathy without macular edema, bilateral: Secondary | ICD-10-CM | POA: Diagnosis not present

## 2024-07-01 DIAGNOSIS — I5030 Unspecified diastolic (congestive) heart failure: Secondary | ICD-10-CM | POA: Diagnosis not present

## 2024-07-01 DIAGNOSIS — I110 Hypertensive heart disease with heart failure: Secondary | ICD-10-CM | POA: Diagnosis not present

## 2024-07-01 DIAGNOSIS — E875 Hyperkalemia: Secondary | ICD-10-CM | POA: Diagnosis not present

## 2024-07-01 DIAGNOSIS — R5381 Other malaise: Secondary | ICD-10-CM | POA: Diagnosis not present

## 2024-07-01 DIAGNOSIS — Z7901 Long term (current) use of anticoagulants: Secondary | ICD-10-CM | POA: Diagnosis not present

## 2024-07-01 DIAGNOSIS — L98491 Non-pressure chronic ulcer of skin of other sites limited to breakdown of skin: Secondary | ICD-10-CM | POA: Diagnosis not present

## 2024-07-01 DIAGNOSIS — I480 Paroxysmal atrial fibrillation: Secondary | ICD-10-CM | POA: Diagnosis not present

## 2024-07-01 DIAGNOSIS — E1151 Type 2 diabetes mellitus with diabetic peripheral angiopathy without gangrene: Secondary | ICD-10-CM | POA: Diagnosis not present

## 2024-07-01 DIAGNOSIS — E1165 Type 2 diabetes mellitus with hyperglycemia: Secondary | ICD-10-CM | POA: Diagnosis not present

## 2024-07-01 DIAGNOSIS — Z794 Long term (current) use of insulin: Secondary | ICD-10-CM | POA: Diagnosis not present

## 2024-07-06 ENCOUNTER — Ambulatory Visit: Admitting: Nurse Practitioner

## 2024-07-09 DIAGNOSIS — R338 Other retention of urine: Secondary | ICD-10-CM | POA: Diagnosis not present

## 2024-07-14 DIAGNOSIS — Z9181 History of falling: Secondary | ICD-10-CM | POA: Diagnosis not present

## 2024-07-21 DIAGNOSIS — Z9181 History of falling: Secondary | ICD-10-CM | POA: Diagnosis not present

## 2024-07-22 DIAGNOSIS — I5032 Chronic diastolic (congestive) heart failure: Secondary | ICD-10-CM | POA: Diagnosis not present

## 2024-07-22 DIAGNOSIS — R338 Other retention of urine: Secondary | ICD-10-CM | POA: Diagnosis not present

## 2024-07-22 DIAGNOSIS — E1165 Type 2 diabetes mellitus with hyperglycemia: Secondary | ICD-10-CM | POA: Diagnosis not present

## 2024-07-22 DIAGNOSIS — E785 Hyperlipidemia, unspecified: Secondary | ICD-10-CM | POA: Diagnosis not present

## 2024-07-22 DIAGNOSIS — N189 Chronic kidney disease, unspecified: Secondary | ICD-10-CM | POA: Diagnosis not present

## 2024-07-22 DIAGNOSIS — D631 Anemia in chronic kidney disease: Secondary | ICD-10-CM | POA: Diagnosis not present

## 2024-07-22 DIAGNOSIS — F0390 Unspecified dementia without behavioral disturbance: Secondary | ICD-10-CM | POA: Diagnosis not present

## 2024-07-22 DIAGNOSIS — I4891 Unspecified atrial fibrillation: Secondary | ICD-10-CM | POA: Diagnosis not present

## 2024-07-22 DIAGNOSIS — N401 Enlarged prostate with lower urinary tract symptoms: Secondary | ICD-10-CM | POA: Diagnosis not present

## 2024-07-22 DIAGNOSIS — G4730 Sleep apnea, unspecified: Secondary | ICD-10-CM | POA: Diagnosis not present

## 2024-07-22 DIAGNOSIS — I130 Hypertensive heart and chronic kidney disease with heart failure and stage 1 through stage 4 chronic kidney disease, or unspecified chronic kidney disease: Secondary | ICD-10-CM | POA: Diagnosis not present

## 2024-07-22 DIAGNOSIS — Z466 Encounter for fitting and adjustment of urinary device: Secondary | ICD-10-CM | POA: Diagnosis not present

## 2024-07-28 DIAGNOSIS — Z9181 History of falling: Secondary | ICD-10-CM | POA: Diagnosis not present

## 2024-08-02 ENCOUNTER — Encounter (HOSPITAL_COMMUNITY): Payer: Self-pay | Admitting: Student

## 2024-08-02 ENCOUNTER — Telehealth (HOSPITAL_COMMUNITY): Payer: Self-pay | Admitting: Student

## 2024-08-02 ENCOUNTER — Other Ambulatory Visit (HOSPITAL_COMMUNITY): Payer: Self-pay | Admitting: Student

## 2024-08-02 DIAGNOSIS — R338 Other retention of urine: Secondary | ICD-10-CM

## 2024-08-02 MED ORDER — PHENAZOPYRIDINE HCL 100 MG PO TABS: 100.0000 mg | ORAL_TABLET | Freq: Three times a day (TID) | ORAL | 0 refills | Status: AC

## 2024-08-02 MED ORDER — CIPROFLOXACIN HCL 500 MG PO TABS: 500.0000 mg | ORAL_TABLET | Freq: Two times a day (BID) | ORAL | 0 refills | Status: AC

## 2024-08-02 MED ORDER — SOLIFENACIN SUCCINATE 5 MG PO TABS: 5.0000 mg | ORAL_TABLET | Freq: Every day | ORAL | 0 refills | Status: AC

## 2024-08-02 NOTE — Telephone Encounter (Signed)
 Patient scheduled for prostate artery embolization with Dr. Jennefer 08/05/24. Post-procedure medications e-prescribed to his pharmacy. A voice message was left on the patient's sister's cell phone with request to call the clinic back with any questions.  Beyounce Dickens, AGACNP-BC 08/02/2024, 3:25 PM

## 2024-08-04 ENCOUNTER — Other Ambulatory Visit: Payer: Self-pay | Admitting: Student

## 2024-08-04 NOTE — H&P (Signed)
 Chief Complaint: Patient was seen in consultation today for benign prostatic hyperplasia, with consideration for prostatic artery embolization.  Referring Provider(s): Dr. Lonni Han, MD  Supervising Physician: Jennefer Rover  Patient Status: [facility] - Out-pt  Patient is Full Code  History of Present Illness: Raymond Gordon is a 73 y.o. male  with PMHx notable for BPH with elevated PSA, heart failure, atrial fibrillation on aspirin /Eliquis , DM2, HTN, obesity, and severe OSA  Per Dr. Terrill progress note dated 05/03/2024: [Patient] was briefly hospitalized in April 2025 for atrial fibrillation with RVR complicated by heart failure exacerbation. He also suffered from an acute kidney injury with urinary retention requiring foley catheter placement. He was discharged from the hospital with the foley catheter in place.    The patient had a negative prostate biopsy in 2017. His PSA has ranged between 5.0 and 9.5 for the past several years. An MRI prostate from January 2023 showed no high risk lesions and a prostate volume of 173 cc. He was evaluated by Urology Apr 01, 2024 and he was still catheter dependent. He was started on Tamsulosin  0.4 mg BID and has been referred to Interventional Radiology to discuss prostate artery embolization.    IPSS-QoL not applicable due to indwelling Foley, however rates QoL 6/6.  Approximately 1 month after patient consulted with Dr. Jennefer, he was admitted for 10 days secondary to fall with near syncope and hypotension with AKI with volume depletion.  He was treated conservatively, and recommended with discharge to SNF, which is insurance denied.  He was discharged home on 717 with maximum home health services following, as well as close family supervision.    Patient is scheduled for prostate artery embolization in IR today.   Patient is alert and laying in bed, calm.  Patient is currently without any significant complaints.  Patient denies any  fevers, headache, chest pain, SOB, cough, abdominal pain, nausea, vomiting or bleeding.    Past Medical History:  Diagnosis Date   Atrial fibrillation with RVR (HCC) 07/08/2015   Bilateral lower extremity edema    Chronic diastolic CHF (congestive heart failure) (HCC) 08/15/2015   a. 06/2015: echo showing EF of 65-70% with Grade 2 DD   Diabetes (HCC)    INSULIN  DEPENDENT   Dysrhythmia    Hyperglycemia due to type 2 diabetes mellitus (HCC) 04/11/2021   Hyperlipidemia    Hypertension    Hypoglycemia 05/20/2014   Obesity    Obesity    Paroxysmal atrial fibrillation (HCC)    Sleep apnea     Past Surgical History:  Procedure Laterality Date   CARDIAC CATHETERIZATION     CARDIAC CATHETERIZATION N/A 2012   no large vessel occlusions   IR RADIOLOGIST EVAL & MGMT  05/03/2024    Allergies: Patient has no known allergies.  Medications: Prior to Admission medications   Medication Sig Start Date End Date Taking? Authorizing Provider  acetaminophen  (TYLENOL ) 325 MG tablet Take 2 tablets (650 mg total) by mouth every 6 (six) hours as needed for mild pain (pain score 1-3) or fever (or Fever >/= 101). 06/07/24   Elgergawy, Brayton RAMAN, MD  allopurinol  (ZYLOPRIM ) 300 MG tablet Take 300 mg by mouth in the morning. 05/02/15   [provider]  apixaban  (ELIQUIS ) 5 MG TABS tablet Take 1 tablet by mouth twice daily Patient taking differently: Take 5 mg by mouth 2 (two) times daily. 09/19/23   Court Dorn PARAS, MD  atorvastatin  (LIPITOR ) 80 MG tablet Take 1 tablet by  mouth once daily Patient taking differently: Take 80 mg by mouth in the morning. 06/18/23   Court Dorn PARAS, MD  ciprofloxacin  (CIPRO ) 500 MG tablet Take 1 tablet (500 mg total) by mouth 2 (two) times daily for 7 days. 08/02/24 08/09/24  Covington, Jamie R, NP  finasteride  (PROSCAR ) 5 MG tablet Take 5 mg by mouth every evening. 04/11/22   [provider]  furosemide  (LASIX ) 80 MG tablet Take 40 mg by mouth daily.    [provider]  hydrALAZINE  (APRESOLINE ) 25 MG tablet Take 1 tablet (25 mg total) by mouth every 12 (twelve) hours. 06/10/24   Ghimire, Donalda HERO, MD  insulin  aspart (NOVOLOG ) 100 UNIT/ML injection Inject 0-20 Units into the skin 3 (three) times daily with meals. For glucose 121 to 150 use 3 units, for 151 to 200 use 4 units, for 201 to 250 use 7 units, for 251 to 300 use 11 units, for 301 to 350 use 15 units, for 351 to 400 use 20 units. Patient taking differently: Inject 4 Units into the skin 3 (three) times daily with meals. For glucose 121 to 150 use 3 units, for 151 to 200 use 4 units, for 201 to 250 use 7 units, for 251 to 300 use 11 units, for 301 to 350 use 15 units, for 351 to 400 use 20 units. 03/12/24   Arrien, Elidia Sieving, MD  Lancets [facility] DELICA PLUS [place]) MISC Apply topically as directed. 06/29/24   [provider]  LANTUS  SOLOSTAR 100 UNIT/ML Solostar Pen Inject 10 Units into the skin at bedtime. Patient taking differently: Inject 15 Units into the skin at bedtime. 06/10/24   Ghimire, Donalda HERO, MD  metoprolol  tartrate (LOPRESSOR ) 25 MG tablet Take 25 mg by mouth 2 (two) times daily.    [provider]  [facility] VERIO test strip 1 each by Other route as needed for other.    [provider]  pantoprazole  (PROTONIX ) 40 MG tablet Take 1 tablet (40 mg total) by mouth daily. 06/10/24   Ghimire, Donalda HERO, MD  phenazopyridine  (PYRIDIUM ) 100 MG tablet Take 1 tablet (100 mg total) by mouth in the morning, at noon, and at bedtime for 7 days. 08/02/24 08/09/24  Covington, Jamie R, NP  PRESCRIPTION MEDICATION Inhale into the lungs at bedtime. Pt is on C Pap machine    [provider]  solifenacin  (VESICARE ) 5 MG tablet Take 1 tablet (5 mg total) by mouth daily for 7 days. 08/02/24 08/09/24  Tonette Warren SAUNDERS, NP     Family History  Problem Relation Age of Onset   Heart failure Brother    Hyperlipidemia Brother    Hypertension Brother    Stroke Brother     Hypertension Mother    Hyperlipidemia Mother    Diabetes Mother    Cancer Father        prostate   GER disease Father    Stroke Father    Hypertension Sister    Hyperlipidemia Sister    Hypertension Maternal Grandmother    Stroke Maternal Grandmother    Diabetes Maternal Grandfather    Hypertension Maternal Grandfather     Social History   Socioeconomic History   Marital status: Single    Spouse name: Not on file   Number of children: Not on file   Years of education: Not on file   Highest education level: Not on file  Occupational History   Not on file  Tobacco Use   Smoking status: Former  Current packs/day: 0.00    Types: Cigarettes    Quit date: 11/25/1997    Years since quitting: 26.7   Smokeless tobacco: Former    Quit date: 06/14/2015  Vaping Use   Vaping status: Never Used  Substance and Sexual Activity   Alcohol  use: No    Alcohol /week: 0.0 standard drinks of alcohol    Drug use: No   Sexual activity: Not on file  Other Topics Concern   Not on file  Social History Narrative   Not on file   Social Drivers of Health   Financial Resource Strain: Not on file  Food Insecurity: No Food Insecurity (06/01/2024)   Hunger Vital Sign    Worried About Running Out of Food in the Last Year: Never true    Ran Out of Food in the Last Year: Never true  Transportation Needs: No Transportation Needs (06/03/2024)   PRAPARE - Administrator, Civil Service (Medical): No    Lack of Transportation (Non-Medical): No  Physical Activity: Not on file  Stress: Not on file  Social Connections: Moderately Integrated (06/01/2024)   Social Connection and Isolation Panel    Frequency of Communication with Friends and Family: More than three times a week    Frequency of Social Gatherings with Friends and Family: Twice a week    Attends Religious Services: More than 4 times per year    Active Member of [employer] or Organizations: Yes    Attends Engineer, structural:  More than 4 times per year    Marital Status: Divorced     Review of Systems: A 12 point ROS discussed and pertinent positives are indicated in the HPI above.  All other systems are negative.  Vital Signs: BP (!) 178/68   Pulse (!) 52   Resp 18   Ht 5' 5 (1.651 m)   Wt 236 lb (107 kg)   SpO2 96%   BMI 39.27 kg/m   Advance Care Plan: The advanced care place/surrogate decision maker was discussed at the time of visit and the patient did not wish to discuss or was not able to name a surrogate decision maker or provide an advance care plan.  Physical Exam Vitals reviewed.  Constitutional:      General: He is not in acute distress.    Appearance: Normal appearance.  HENT:     Mouth/Throat:     Mouth: Mucous membranes are dry.  Cardiovascular:     Rate and Rhythm: Normal rate and regular rhythm.     Pulses: Normal pulses.     Heart sounds: Normal heart sounds.  Pulmonary:     Effort: Pulmonary effort is normal.     Breath sounds: Normal breath sounds.  Abdominal:     General: Abdomen is flat.     Palpations: Abdomen is soft.  Musculoskeletal:        General: Normal range of motion.     Cervical back: Normal range of motion.  Skin:    General: Skin is warm and dry.  Neurological:     Mental Status: He is alert and oriented to person, place, and time.  Psychiatric:        Mood and Affect: Mood normal.        Behavior: Behavior normal.        Thought Content: Thought content normal.        Judgment: Judgment normal.     Imaging: MR Prostate 11/29/21  173 g  Labs:  CBC: Recent  Labs    06/05/24 0825 06/07/24 1407 06/08/24 0817 08/05/24 1300  WBC 6.5 6.3 7.1 6.0  HGB 12.4* 12.8* 12.2* 12.1*  HCT 39.5 41.3 39.0 38.8*  PLT 277 235 237 293    COAGS: Recent Labs    08/05/24 1300  INR 1.3*    BMP: Recent Labs    06/04/24 0612 06/05/24 0825 06/07/24 1407 06/08/24 0817  NA 141 142 140 142  K 4.2 4.4 4.5 4.4  CL 110 112* 105 109  CO2 21* 22 20* 19*   GLUCOSE 162* 150* 182* 152*  BUN 42* 42* 39* 43*  CALCIUM  9.7 9.9 9.4 9.4  CREATININE 1.03 1.13 1.28* 1.16  GFRNONAA >60 >60 59* >60    LIVER FUNCTION TESTS: Recent Labs    03/08/24 0954 05/31/24 0944 06/01/24 0449 06/02/24 0445 06/03/24 0606 06/04/24 0612  BILITOT 0.7 0.7 0.5  --   --   --   AST 14* 29 17  --   --   --   ALT 12 25 21   --   --   --   ALKPHOS 67 87 76  --   --   --   PROT 6.9 8.3* 7.5  --   --   --   ALBUMIN 2.9* 3.3* 3.1* 2.9* 2.7* 3.0*    TUMOR MARKERS: No results for input(s): AFPTM, CEA, CA199, CHROMGRNA in the last 8760 hours.  Assessment and Plan: Per Dr. Terrill progress note dated 05/03/2024: 73 year old male with a history of benign prostatic hyperplasia (~173 g) with severe lower urinary tract symptoms. He has been foley catheter dependent since his hospitalization in April.  He would be an excellent candidate for prostate artery embolization.  We discussed the rationale, periprocedural expectations, and long term outcomes related to prostate artery embolization.  He is amenable and wishes to proceed.   Plan for prostate artery embolization with moderate sedation at [facility], plan for left radial artery access.  Patient presents for scheduled prostate artery embolization in IR today.  Patient has been NPO since midnight.  All labs and medications are within acceptable parameters.  No pertinent allergies.   The Risks and benefits of embolization were discussed with the patient including, but not limited to bleeding, infection, vascular injury, post operative pain, or contrast induced renal failure.  This procedure involves the use of X-rays and because of the nature of the planned procedure, it is possible that we will have prolonged use of X-ray fluoroscopy.  Potential radiation risks to you include (but are not limited to) the following: - A slightly elevated risk for cancer several years later in life. This risk is  typically less than 0.5% percent. This risk is low in comparison to the normal incidence of human cancer, which is 33% for women and 50% for men according to the American Cancer Society. - Radiation induced injury can include skin redness, resembling a rash, tissue breakdown / ulcers and hair loss (which can be temporary or permanent).   The likelihood of either of these occurring depends on the difficulty of the procedure and whether you are sensitive to radiation due to previous procedures, disease, or genetic conditions.   IF your procedure requires a prolonged use of radiation, you will be notified and given written instructions for further action.  It is your responsibility to monitor the irradiated area for the 2 weeks following the procedure and to notify your physician if you are concerned that you have suffered a radiation induced injury.  All of the patient's questions were answered, patient is agreeable to proceed. Consent signed and in chart.    Thank you for allowing our service to participate in Raymond Gordon 's care.  Electronically Signed: Carlin DELENA Griffon, PA-C   08/05/2024, 1:32 PM      I spent a total of 25 Minutes in face to face in clinical consultation, greater than 50% of which was counseling/coordinating care for benign prostatic hyperplasia, with consideration for prostatic artery embolization.

## 2024-08-05 ENCOUNTER — Ambulatory Visit (HOSPITAL_COMMUNITY)
Admission: RE | Admit: 2024-08-05 | Discharge: 2024-08-05 | Disposition: A | Discharge status: Home / Self Care | Source: Ambulatory Visit | Attending: Interventional Radiology | Admitting: Interventional Radiology

## 2024-08-05 ENCOUNTER — Other Ambulatory Visit (HOSPITAL_COMMUNITY): Payer: Self-pay | Admitting: Interventional Radiology

## 2024-08-05 ENCOUNTER — Other Ambulatory Visit: Payer: Self-pay

## 2024-08-05 DIAGNOSIS — N138 Other obstructive and reflux uropathy: Secondary | ICD-10-CM

## 2024-08-05 DIAGNOSIS — Z87891 Personal history of nicotine dependence: Secondary | ICD-10-CM | POA: Diagnosis not present

## 2024-08-05 DIAGNOSIS — Z7901 Long term (current) use of anticoagulants: Secondary | ICD-10-CM | POA: Diagnosis not present

## 2024-08-05 DIAGNOSIS — N401 Enlarged prostate with lower urinary tract symptoms: Secondary | ICD-10-CM | POA: Diagnosis not present

## 2024-08-05 DIAGNOSIS — R338 Other retention of urine: Secondary | ICD-10-CM

## 2024-08-05 HISTORY — PX: IR ANGIOGRAM VISCERAL SELECTIVE: IMG657

## 2024-08-05 HISTORY — PX: IR EMBO ARTERIAL NOT HEMORR HEMANG INC GUIDE ROADMAPPING: IMG5448

## 2024-08-05 HISTORY — PX: IR US GUIDE VASC ACCESS LEFT: IMG2389

## 2024-08-05 HISTORY — PX: IR ANGIOGRAM SELECTIVE EACH ADDITIONAL VESSEL: IMG667

## 2024-08-05 LAB — BASIC METABOLIC PANEL WITH GFR
Anion gap: 10 (ref 5–15)
BUN: 26 mg/dL — ABNORMAL HIGH (ref 8–23)
CO2: 22 mmol/L (ref 22–32)
Calcium: 9.3 mg/dL (ref 8.9–10.3)
Chloride: 106 mmol/L (ref 98–111)
Creatinine, Ser: 0.8 mg/dL (ref 0.61–1.24)
GFR, Estimated: >60 mL/min (ref >60)
Glucose, Bld: 182 mg/dL — ABNORMAL HIGH (ref 70–99)
Potassium: 4.1 mmol/L (ref 3.5–5.1)
Sodium: 138 mmol/L (ref 135–145)

## 2024-08-05 LAB — CBC
HCT: 38.8 % — ABNORMAL LOW (ref 39.0–52.0)
Hemoglobin: 12.1 g/dL — ABNORMAL LOW (ref 13.0–17.0)
MCH: 28.5 pg (ref 26.0–34.0)
MCHC: 31.2 g/dL (ref 30.0–36.0)
MCV: 91.3 fL (ref 80.0–100.0)
Platelets: 293 K/uL (ref 150–400)
RBC: 4.25 MIL/uL (ref 4.22–5.81)
RDW: 16.5 % — ABNORMAL HIGH (ref 11.5–15.5)
WBC: 6 K/uL (ref 4.0–10.5)
nRBC: 0 % (ref 0.0–0.2)

## 2024-08-05 LAB — GLUCOSE, CAPILLARY: Glucose-Capillary: 193 mg/dL — ABNORMAL HIGH (ref 70–99)

## 2024-08-05 LAB — PROTIME-INR
INR: 1.3 — ABNORMAL HIGH (ref 0.8–1.2)
Prothrombin Time: 17.3 s — ABNORMAL HIGH (ref 11.4–15.2)

## 2024-08-05 MED ORDER — SODIUM CHLORIDE 0.9 % IV SOLN: INTRAVENOUS | Status: DC

## 2024-08-05 MED ORDER — FENTANYL CITRATE (PF) 100 MCG/2ML IJ SOLN
INTRAMUSCULAR | Status: AC
  Filled 2024-08-05: qty 2

## 2024-08-05 MED ORDER — CIPROFLOXACIN IN D5W 400 MG/200ML IV SOLN
INTRAVENOUS | Status: AC | PRN
  Administered 2024-08-05: 400 mg via INTRAVENOUS

## 2024-08-05 MED ORDER — CIPROFLOXACIN IN D5W 400 MG/200ML IV SOLN: 400.0000 mg | INTRAVENOUS | Status: DC

## 2024-08-05 MED ORDER — NITROGLYCERIN 1 MG/10 ML FOR IR/CATH LAB
INTRA_ARTERIAL | Status: AC
  Filled 2024-08-05: qty 10

## 2024-08-05 MED ORDER — LIDOCAINE HCL 1 % IJ SOLN
20.0000 mL | Freq: Once | INTRAMUSCULAR | Status: AC
  Administered 2024-08-05: 5 mL

## 2024-08-05 MED ORDER — CIPROFLOXACIN IN D5W 400 MG/200ML IV SOLN
INTRAVENOUS | Status: AC
  Filled 2024-08-05: qty 200

## 2024-08-05 MED ORDER — VERAPAMIL HCL 2.5 MG/ML IV SOLN: INTRA_ARTERIAL | Status: AC | PRN

## 2024-08-05 MED ORDER — MIDAZOLAM HCL 2 MG/2ML IJ SOLN
INTRAMUSCULAR | Status: AC | PRN
  Administered 2024-08-05 (×6): .5 mg via INTRAVENOUS
  Administered 2024-08-05: 1 mg via INTRAVENOUS

## 2024-08-05 MED ORDER — NITROGLYCERIN 2 % TD OINT
1.0000 [in_us] | TOPICAL_OINTMENT | Freq: Once | TRANSDERMAL | Status: AC
  Administered 2024-08-05: 1 [in_us] via TOPICAL

## 2024-08-05 MED ORDER — PREDNISONE 20 MG PO TABS: 20.0000 mg | ORAL_TABLET | Freq: Once | ORAL | Status: AC

## 2024-08-05 MED ORDER — IODIXANOL 320 MG/ML IV SOLN
100.0000 mL | Freq: Once | INTRAVENOUS | Status: AC | PRN
  Administered 2024-08-05: 67 mL via INTRA_ARTERIAL

## 2024-08-05 MED ORDER — PREDNISONE 20 MG PO TABS
ORAL_TABLET | ORAL | Status: AC
  Administered 2024-08-05: 20 mg via ORAL
  Filled 2024-08-05: qty 1

## 2024-08-05 MED ORDER — HEPARIN SODIUM (PORCINE) 1000 UNIT/ML IJ SOLN
INTRAMUSCULAR | Status: AC
  Filled 2024-08-05: qty 10

## 2024-08-05 MED ORDER — LIDOCAINE HCL (PF) 1 % IJ SOLN
INTRAMUSCULAR | Status: AC
  Filled 2024-08-05: qty 30

## 2024-08-05 MED ORDER — VERAPAMIL HCL 2.5 MG/ML IV SOLN
INTRAVENOUS | Status: AC
  Filled 2024-08-05: qty 2

## 2024-08-05 MED ORDER — LIDOCAINE-PRILOCAINE 2.5-2.5 % EX CREA: TOPICAL_CREAM | Freq: Once | CUTANEOUS | Status: AC

## 2024-08-05 MED ORDER — IODIXANOL 320 MG/ML IV SOLN
100.0000 mL | Freq: Once | INTRAVENOUS | Status: AC | PRN
  Administered 2024-08-05: 6 mL via INTRA_ARTERIAL

## 2024-08-05 MED ORDER — IODIXANOL 320 MG/ML IV SOLN
100.0000 mL | Freq: Once | INTRAVENOUS | Status: AC | PRN
  Administered 2024-08-05: 67 mL via INTRAVENOUS

## 2024-08-05 MED ORDER — MIDAZOLAM HCL 2 MG/2ML IJ SOLN
INTRAMUSCULAR | Status: AC
  Filled 2024-08-05: qty 2

## 2024-08-05 MED ORDER — FENTANYL CITRATE (PF) 100 MCG/2ML IJ SOLN
INTRAMUSCULAR | Status: AC | PRN
  Administered 2024-08-05: 50 ug via INTRAVENOUS
  Administered 2024-08-05: 25 ug via INTRAVENOUS
  Administered 2024-08-05: 50 ug via INTRAVENOUS
  Administered 2024-08-05 (×3): 25 ug via INTRAVENOUS

## 2024-08-05 NOTE — Sedation Documentation (Signed)
 Writer is staying with patient until short stay nurse is available

## 2024-08-05 NOTE — Procedures (Signed)
 Interventional Radiology Procedure Note  Procedure:  Right prostate artery embolization  Findings: Please refer to procedural dictation for full description.  Pt unable to remain still enough to navigate  left sided vessels.  Left radial artery access, TR band applied with 16 cc air at 16:30  Complications: None immediate  Estimated Blood Loss: < 5 mL  Recommendations: TR band protocol release. IR will arrange 1 month outpatient follow up.   Ester Sides, MD

## 2024-08-05 NOTE — Progress Notes (Signed)
 Writer is in Short Stay room 6 with patient until Short Stay nurse is available to recover the patient.  Attempted to call report at [facility]

## 2024-08-11 ENCOUNTER — Ambulatory Visit (INDEPENDENT_AMBULATORY_CARE_PROVIDER_SITE_OTHER): Admitting: Podiatry

## 2024-08-11 ENCOUNTER — Encounter: Payer: Self-pay | Admitting: Podiatry

## 2024-08-11 DIAGNOSIS — E119 Type 2 diabetes mellitus without complications: Secondary | ICD-10-CM

## 2024-08-11 DIAGNOSIS — M2142 Flat foot [pes planus] (acquired), left foot: Secondary | ICD-10-CM | POA: Diagnosis not present

## 2024-08-11 DIAGNOSIS — M79675 Pain in left toe(s): Secondary | ICD-10-CM

## 2024-08-11 DIAGNOSIS — I890 Lymphedema, not elsewhere classified: Secondary | ICD-10-CM

## 2024-08-11 DIAGNOSIS — B351 Tinea unguium: Secondary | ICD-10-CM | POA: Diagnosis not present

## 2024-08-11 DIAGNOSIS — M2141 Flat foot [pes planus] (acquired), right foot: Secondary | ICD-10-CM | POA: Diagnosis not present

## 2024-08-11 DIAGNOSIS — M79674 Pain in right toe(s): Secondary | ICD-10-CM | POA: Diagnosis not present

## 2024-08-11 DIAGNOSIS — E1151 Type 2 diabetes mellitus with diabetic peripheral angiopathy without gangrene: Secondary | ICD-10-CM

## 2024-08-12 ENCOUNTER — Other Ambulatory Visit: Payer: Self-pay | Admitting: Interventional Radiology

## 2024-08-12 DIAGNOSIS — N401 Enlarged prostate with lower urinary tract symptoms: Secondary | ICD-10-CM

## 2024-08-15 NOTE — Progress Notes (Signed)
  Subjective:  Patient ID: Raymond Gordon, male    DOB: [birth year],  MRN: [id number]  Raymond Gordon presents to clinic today for for annual diabetic foot examination and painful thick toenails that are difficult to trim. Pain interferes with ambulation. Aggravating factors include wearing enclosed shoe gear. Pain is relieved with periodic professional debridement. He is accompanied by his sister, Charolette, on today's visit. Patient has been hospitalized for urinary retention. He is   New problem(s): None.   PCP is [name], [name], MD.  No Known Allergies  Review of Systems: Negative except as noted in the HPI.  Objective: No changes noted in today's physical examination. There were no vitals filed for this visit. Raymond Gordon is a pleasant 73 y.o. male morbidly obese in NAD. AAO x 3.   Diabetic foot exam was performed with the following findings:   Vascular Examination: CFT <3 seconds b/l. DP/PT pulses faintly palpable b/l. Pedal edema absent. Skin temperature gradient warm to warm b/l. Digital hair absent b/l. No pain with calf compression. No ischemia or gangrene. No cyanosis or clubbing noted b/l. Lymphedema present BLE.   Neurological Examination: Sensation grossly intact b/l with 10 gram monofilament. Vibratory sensation intact b/l.   Dermatological Examination: Pedal skin warm and supple b/l.   No open wounds. No interdigital macerations.  Toenails 1-5 b/l thick, discolored, elongated with subungual debris and pain on dorsal palpation.    No hyperkeratotic nor porokeratotic lesions.  Improvement in pedal hygiene noted b/l.  Musculoskeletal Examination: Muscle strength 5/5 to all lower extremity muscle groups bilaterally. No pain, crepitus or joint limitation noted with ROM b/l LE. Utilizes walker for ambulation assistance.. Patient ambulates with walker assistance.  Radiographs: None     Assessment/Plan: 1. Pain due to onychomycosis of toenails of both feet   2.  Lymphedema   3. Type II diabetes mellitus with peripheral circulatory disorder (HCC)   4. Pes planus of both feet   5. Encounter for diabetic foot exam (HCC)    Diabetic foot examination performed today. All patient's and/or POA's questions/concerns addressed on today's visit. I debrided b/l great toes. Toenails 2-5 bilaterally debrided in length and girth without incident. Treatment was provided by assistant Andrez Manchester under my supervision.  Monitor blood glucose per PCP/Endocrinologist's recommendations.Continue soft, supportive shoe gear daily. Report any pedal injuries to medical professional. Call office if there are any questions/concerns. -Patient/POA to call should there be question/concern in the interim.   Return in about 3 months (around 11/10/2024).  Delon LITTIE Merlin, DPM      [place] LOCATION: [address] [name] [address]                   Office [phone]   [facility] LOCATION: [address] [place], [name] [address] Office [phone]

## 2024-08-16 DIAGNOSIS — G4733 Obstructive sleep apnea (adult) (pediatric): Secondary | ICD-10-CM | POA: Diagnosis not present

## 2024-08-16 DIAGNOSIS — I5030 Unspecified diastolic (congestive) heart failure: Secondary | ICD-10-CM | POA: Diagnosis not present

## 2024-08-16 DIAGNOSIS — E1151 Type 2 diabetes mellitus with diabetic peripheral angiopathy without gangrene: Secondary | ICD-10-CM | POA: Diagnosis not present

## 2024-08-16 DIAGNOSIS — I209 Angina pectoris, unspecified: Secondary | ICD-10-CM | POA: Diagnosis not present

## 2024-08-16 DIAGNOSIS — Z7901 Long term (current) use of anticoagulants: Secondary | ICD-10-CM | POA: Diagnosis not present

## 2024-08-16 DIAGNOSIS — Z794 Long term (current) use of insulin: Secondary | ICD-10-CM | POA: Diagnosis not present

## 2024-08-16 DIAGNOSIS — Z23 Encounter for immunization: Secondary | ICD-10-CM | POA: Diagnosis not present

## 2024-08-16 DIAGNOSIS — I110 Hypertensive heart disease with heart failure: Secondary | ICD-10-CM | POA: Diagnosis not present

## 2024-08-16 DIAGNOSIS — M109 Gout, unspecified: Secondary | ICD-10-CM | POA: Diagnosis not present

## 2024-08-16 DIAGNOSIS — I480 Paroxysmal atrial fibrillation: Secondary | ICD-10-CM | POA: Diagnosis not present

## 2024-08-16 DIAGNOSIS — E7800 Pure hypercholesterolemia, unspecified: Secondary | ICD-10-CM | POA: Diagnosis not present

## 2024-08-16 DIAGNOSIS — L850 Acquired ichthyosis: Secondary | ICD-10-CM | POA: Diagnosis not present

## 2024-08-16 DIAGNOSIS — R972 Elevated prostate specific antigen [PSA]: Secondary | ICD-10-CM | POA: Diagnosis not present

## 2024-08-16 DIAGNOSIS — D6869 Other thrombophilia: Secondary | ICD-10-CM | POA: Diagnosis not present

## 2024-08-19 DIAGNOSIS — N401 Enlarged prostate with lower urinary tract symptoms: Secondary | ICD-10-CM | POA: Diagnosis not present

## 2024-08-19 DIAGNOSIS — R338 Other retention of urine: Secondary | ICD-10-CM | POA: Diagnosis not present

## 2024-08-25 ENCOUNTER — Encounter (HOSPITAL_BASED_OUTPATIENT_CLINIC_OR_DEPARTMENT_OTHER): Admitting: Cardiology

## 2024-09-03 NOTE — Progress Notes (Signed)
 Referring Physician(s): Winter,Christopher Psychologist, prison and probation services Complaint: The patient is seen in follow up today s/p prostate artery embolization 08/05/24  History of present illness: HPI from initial consultation 05/03/24 Raymond Gordon is a 73 y.o. male with a medical history significant for heart failure, atrial fibrillation on Aspirin /Eliquis , DM2, HTN, obesity, severe OSA and benign prostatic hyperplasia with elevated PSA. He was briefly hospitalized in April 2025 for atrial fibrillation with RVR complicated by heart failure exacerbation. He also suffered from an acute kidney injury with urinary retention requiring foley catheter placement. He was discharged from the hospital with the foley catheter in place.    The patient had a negative prostate biopsy in 2017. His PSA has ranged between 5.0 and 9.5 for the past several years. An MRI prostate from January 2023 showed no high risk lesions and a prostate volume of 173 cc. He was evaluated by Urology Apr 01, 2024 and he was still catheter dependent. He was started on Tamsulosin  0.4 mg BID and has been referred to Interventional Radiology to discuss prostate artery embolization.    IPSS-QoL not applicable due to indwelling Foley, however rates QoL 6/6.  He presented for his procedure 08/05/24 and underwent a technically successful right prostate artery embolization. Due to anatomic limitations and the patient's inability to lie still on the table the left prostate artery was not embolized. The patient was discharged home after an uneventful recovery period and he presents to the IR outpatient clinic today for follow up.   Past Medical History:  Diagnosis Date   Atrial fibrillation with RVR (HCC) 07/08/2015   Bilateral lower extremity edema    Chronic diastolic CHF (congestive heart failure) (HCC) 08/15/2015   a. 06/2015: echo showing EF of 65-70% with Grade 2 DD   Diabetes (HCC)    INSULIN  DEPENDENT   Dysrhythmia    Hyperglycemia due to type 2  diabetes mellitus (HCC) 04/11/2021   Hyperlipidemia    Hypertension    Hypoglycemia 05/20/2014   Obesity    Obesity    Paroxysmal atrial fibrillation (HCC)    Sleep apnea     Past Surgical History:  Procedure Laterality Date   CARDIAC CATHETERIZATION     CARDIAC CATHETERIZATION N/A 2012   no large vessel occlusions   IR ANGIOGRAM SELECTIVE EACH ADDITIONAL VESSEL  08/05/2024   IR ANGIOGRAM SELECTIVE EACH ADDITIONAL VESSEL  08/05/2024   IR ANGIOGRAM VISCERAL SELECTIVE  08/05/2024   IR ANGIOGRAM VISCERAL SELECTIVE  08/05/2024   IR EMBO ARTERIAL NOT HEMORR HEMANG INC GUIDE ROADMAPPING  08/05/2024   IR RADIOLOGIST EVAL & MGMT  05/03/2024   IR US  GUIDE VASC ACCESS LEFT  08/05/2024    Allergies: Patient has no known allergies.  Medications: Prior to Admission medications   Medication Sig Start Date End Date Taking? Authorizing Provider  acetaminophen  (TYLENOL ) 325 MG tablet Take 2 tablets (650 mg total) by mouth every 6 (six) hours as needed for mild pain (pain score 1-3) or fever (or Fever >/= 101). 06/07/24   Elgergawy, Brayton RAMAN, MD  allopurinol  (ZYLOPRIM ) 300 MG tablet Take 300 mg by mouth in the morning. 05/02/15   [provider]  apixaban  (ELIQUIS ) 5 MG TABS tablet Take 1 tablet by mouth twice daily Patient taking differently: Take 5 mg by mouth 2 (two) times daily. 09/19/23   Court Dorn PARAS, MD  atorvastatin  (LIPITOR ) 80 MG tablet Take 1 tablet by mouth once daily Patient taking differently: Take 80 mg by mouth in the morning. 06/18/23  Court Dorn PARAS, MD  finasteride  (PROSCAR ) 5 MG tablet Take 5 mg by mouth every evening. 04/11/22   [provider]  furosemide  (LASIX ) 80 MG tablet Take 40 mg by mouth daily.    [provider]  hydrALAZINE  (APRESOLINE ) 25 MG tablet Take 1 tablet (25 mg total) by mouth every 12 (twelve) hours. 06/10/24   Ghimire, Donalda HERO, MD  insulin  aspart (NOVOLOG ) 100 UNIT/ML injection Inject 0-20 Units into the skin 3 (three) times  daily with meals. For glucose 121 to 150 use 3 units, for 151 to 200 use 4 units, for 201 to 250 use 7 units, for 251 to 300 use 11 units, for 301 to 350 use 15 units, for 351 to 400 use 20 units. Patient taking differently: Inject 4 Units into the skin 3 (three) times daily with meals. For glucose 121 to 150 use 3 units, for 151 to 200 use 4 units, for 201 to 250 use 7 units, for 251 to 300 use 11 units, for 301 to 350 use 15 units, for 351 to 400 use 20 units. 03/12/24   Arrien, Elidia Sieving, MD  Lancets [facility] DELICA PLUS [place]) MISC Apply topically as directed. 06/29/24   [provider]  LANTUS  SOLOSTAR 100 UNIT/ML Solostar Pen Inject 10 Units into the skin at bedtime. Patient taking differently: Inject 15 Units into the skin at bedtime. 06/10/24   Ghimire, Donalda HERO, MD  metoprolol  tartrate (LOPRESSOR ) 25 MG tablet Take 25 mg by mouth 2 (two) times daily.    [provider]  [facility] VERIO test strip 1 each by Other route as needed for other.    [provider]  pantoprazole  (PROTONIX ) 40 MG tablet Take 1 tablet (40 mg total) by mouth daily. 06/10/24   Ghimire, Donalda HERO, MD  PRESCRIPTION MEDICATION Inhale into the lungs at bedtime. Pt is on C Pap machine    [provider]     Family History  Problem Relation Age of Onset   Heart failure Brother    Hyperlipidemia Brother    Hypertension Brother    Stroke Brother    Hypertension Mother    Hyperlipidemia Mother    Diabetes Mother    Cancer Father        prostate   GER disease Father    Stroke Father    Hypertension Sister    Hyperlipidemia Sister    Hypertension Maternal Grandmother    Stroke Maternal Grandmother    Diabetes Maternal Grandfather    Hypertension Maternal Grandfather     Social History   Socioeconomic History   Marital status: Single    Spouse name: Not on file   Number of children: Not on file   Years of education: Not on file   Highest education level: Not on file   Occupational History   Not on file  Tobacco Use   Smoking status: Former    Current packs/day: 0.00    Types: Cigarettes    Quit date: 11/25/1997    Years since quitting: 26.7   Smokeless tobacco: Former    Quit date: 06/14/2015  Vaping Use   Vaping status: Never Used  Substance and Sexual Activity   Alcohol  use: No    Alcohol /week: 0.0 standard drinks of alcohol    Drug use: No   Sexual activity: Not on file  Other Topics Concern   Not on file  Social History Narrative   Not on file   Social Drivers of Corporate investment banker  Strain: Not on file  Food Insecurity: No Food Insecurity (06/01/2024)   Hunger Vital Sign    Worried About Running Out of Food in the Last Year: Never true    Ran Out of Food in the Last Year: Never true  Transportation Needs: No Transportation Needs (06/03/2024)   PRAPARE - Administrator, Civil Service (Medical): No    Lack of Transportation (Non-Medical): No  Physical Activity: Not on file  Stress: Not on file  Social Connections: Moderately Integrated (06/01/2024)   Social Connection and Isolation Panel    Frequency of Communication with Friends and Family: More than three times a week    Frequency of Social Gatherings with Friends and Family: Twice a week    Attends Religious Services: More than 4 times per year    Active Member of [employer] or Organizations: Yes    Attends Engineer, structural: More than 4 times per year    Marital Status: Divorced     Vital Signs: There were no vitals taken for this visit.  Physical Exam  Imaging: MR Prostate 11/29/21  173 g  Labs:  CBC: Recent Labs    06/05/24 0825 06/07/24 1407 06/08/24 0817 08/05/24 1300  WBC 6.5 6.3 7.1 6.0  HGB 12.4* 12.8* 12.2* 12.1*  HCT 39.5 41.3 39.0 38.8*  PLT 277 235 237 293    COAGS: Recent Labs    08/05/24 1300  INR 1.3*    BMP: Recent Labs    06/05/24 0825 06/07/24 1407 06/08/24 0817 08/05/24 1300  NA 142 140 142 138  K 4.4 4.5  4.4 4.1  CL 112* 105 109 106  CO2 22 20* 19* 22  GLUCOSE 150* 182* 152* 182*  BUN 42* 39* 43* 26*  CALCIUM  9.9 9.4 9.4 9.3  CREATININE 1.13 1.28* 1.16 0.80  GFRNONAA >60 59* >60 >60    LIVER FUNCTION TESTS: Recent Labs    03/08/24 0954 05/31/24 0944 06/01/24 0449 06/02/24 0445 06/03/24 0606 06/04/24 0612  BILITOT 0.7 0.7 0.5  --   --   --   AST 14* 29 17  --   --   --   ALT 12 25 21   --   --   --   ALKPHOS 67 87 76  --   --   --   PROT 6.9 8.3* 7.5  --   --   --   ALBUMIN 2.9* 3.3* 3.1* 2.9* 2.7* 3.0*    Assessment and Plan:  73 year old male with a history of benign prostatic hyperplasia (~173 g) with severe lower urinary tract symptoms. He has been foley catheter dependent since his hospitalization in April. He underwent a technically successful right prostate artery embolization    Ester Sides, MD Pager: [phone]    I spent a total of 25 Minutes in face to face in clinical consultation, greater than 50% of which was counseling/coordinating care for benign prostatic hyperplasia

## 2024-09-06 ENCOUNTER — Ambulatory Visit
Admission: RE | Admit: 2024-09-06 | Discharge: 2024-09-06 | Disposition: A | Discharge status: Home / Self Care | Source: Ambulatory Visit | Attending: Interventional Radiology | Admitting: Interventional Radiology

## 2024-09-06 DIAGNOSIS — N401 Enlarged prostate with lower urinary tract symptoms: Secondary | ICD-10-CM

## 2024-09-06 HISTORY — PX: IR RADIOLOGIST EVAL & MGMT: IMG5224

## 2024-09-08 ENCOUNTER — Ambulatory Visit: Admitting: Podiatry

## 2024-09-16 DIAGNOSIS — R338 Other retention of urine: Secondary | ICD-10-CM | POA: Diagnosis not present

## 2024-09-16 DIAGNOSIS — N401 Enlarged prostate with lower urinary tract symptoms: Secondary | ICD-10-CM | POA: Diagnosis not present

## 2024-09-17 DIAGNOSIS — F0390 Unspecified dementia without behavioral disturbance: Secondary | ICD-10-CM | POA: Diagnosis not present

## 2024-09-17 DIAGNOSIS — D631 Anemia in chronic kidney disease: Secondary | ICD-10-CM | POA: Diagnosis not present

## 2024-09-17 DIAGNOSIS — R338 Other retention of urine: Secondary | ICD-10-CM | POA: Diagnosis not present

## 2024-09-17 DIAGNOSIS — I5032 Chronic diastolic (congestive) heart failure: Secondary | ICD-10-CM | POA: Diagnosis not present

## 2024-09-17 DIAGNOSIS — E785 Hyperlipidemia, unspecified: Secondary | ICD-10-CM | POA: Diagnosis not present

## 2024-09-17 DIAGNOSIS — G4730 Sleep apnea, unspecified: Secondary | ICD-10-CM | POA: Diagnosis not present

## 2024-09-17 DIAGNOSIS — E1165 Type 2 diabetes mellitus with hyperglycemia: Secondary | ICD-10-CM | POA: Diagnosis not present

## 2024-09-17 DIAGNOSIS — N401 Enlarged prostate with lower urinary tract symptoms: Secondary | ICD-10-CM | POA: Diagnosis not present

## 2024-09-17 DIAGNOSIS — N189 Chronic kidney disease, unspecified: Secondary | ICD-10-CM | POA: Diagnosis not present

## 2024-09-17 DIAGNOSIS — Z466 Encounter for fitting and adjustment of urinary device: Secondary | ICD-10-CM | POA: Diagnosis not present

## 2024-09-17 DIAGNOSIS — I130 Hypertensive heart and chronic kidney disease with heart failure and stage 1 through stage 4 chronic kidney disease, or unspecified chronic kidney disease: Secondary | ICD-10-CM | POA: Diagnosis not present

## 2024-09-17 DIAGNOSIS — I4891 Unspecified atrial fibrillation: Secondary | ICD-10-CM | POA: Diagnosis not present

## 2024-09-23 DIAGNOSIS — E7800 Pure hypercholesterolemia, unspecified: Secondary | ICD-10-CM | POA: Diagnosis not present

## 2024-09-23 DIAGNOSIS — Z794 Long term (current) use of insulin: Secondary | ICD-10-CM | POA: Diagnosis not present

## 2024-09-23 DIAGNOSIS — E1165 Type 2 diabetes mellitus with hyperglycemia: Secondary | ICD-10-CM | POA: Diagnosis not present

## 2024-09-23 DIAGNOSIS — I110 Hypertensive heart disease with heart failure: Secondary | ICD-10-CM | POA: Diagnosis not present

## 2024-10-14 DIAGNOSIS — N401 Enlarged prostate with lower urinary tract symptoms: Secondary | ICD-10-CM | POA: Diagnosis not present

## 2024-10-14 DIAGNOSIS — R338 Other retention of urine: Secondary | ICD-10-CM | POA: Diagnosis not present

## 2024-10-18 ENCOUNTER — Encounter (HOSPITAL_COMMUNITY): Payer: Self-pay

## 2024-10-18 ENCOUNTER — Emergency Department (HOSPITAL_COMMUNITY)

## 2024-10-18 ENCOUNTER — Other Ambulatory Visit: Payer: Self-pay

## 2024-10-18 ENCOUNTER — Emergency Department (HOSPITAL_COMMUNITY)
Admission: EM | Admit: 2024-10-18 | Discharge: 2024-10-18 | Disposition: A | Discharge status: Home / Self Care | Attending: Emergency Medicine | Admitting: Emergency Medicine

## 2024-10-18 DIAGNOSIS — Z79899 Other long term (current) drug therapy: Secondary | ICD-10-CM | POA: Diagnosis not present

## 2024-10-18 DIAGNOSIS — Z7901 Long term (current) use of anticoagulants: Secondary | ICD-10-CM | POA: Diagnosis not present

## 2024-10-18 DIAGNOSIS — D649 Anemia, unspecified: Secondary | ICD-10-CM | POA: Diagnosis not present

## 2024-10-18 DIAGNOSIS — R944 Abnormal results of kidney function studies: Secondary | ICD-10-CM | POA: Diagnosis not present

## 2024-10-18 DIAGNOSIS — R29818 Other symptoms and signs involving the nervous system: Secondary | ICD-10-CM | POA: Diagnosis not present

## 2024-10-18 DIAGNOSIS — I6502 Occlusion and stenosis of left vertebral artery: Secondary | ICD-10-CM | POA: Diagnosis not present

## 2024-10-18 DIAGNOSIS — R739 Hyperglycemia, unspecified: Secondary | ICD-10-CM | POA: Diagnosis not present

## 2024-10-18 DIAGNOSIS — I5032 Chronic diastolic (congestive) heart failure: Secondary | ICD-10-CM | POA: Diagnosis not present

## 2024-10-18 DIAGNOSIS — R29810 Facial weakness: Secondary | ICD-10-CM | POA: Diagnosis not present

## 2024-10-18 DIAGNOSIS — Z794 Long term (current) use of insulin: Secondary | ICD-10-CM | POA: Insufficient documentation

## 2024-10-18 DIAGNOSIS — I110 Hypertensive heart disease with heart failure: Secondary | ICD-10-CM | POA: Insufficient documentation

## 2024-10-18 DIAGNOSIS — I4891 Unspecified atrial fibrillation: Secondary | ICD-10-CM

## 2024-10-18 DIAGNOSIS — S0285XA Fracture of orbit, unspecified, initial encounter for closed fracture: Secondary | ICD-10-CM | POA: Diagnosis not present

## 2024-10-18 DIAGNOSIS — R600 Localized edema: Secondary | ICD-10-CM | POA: Diagnosis not present

## 2024-10-18 DIAGNOSIS — I6523 Occlusion and stenosis of bilateral carotid arteries: Secondary | ICD-10-CM | POA: Diagnosis not present

## 2024-10-18 DIAGNOSIS — G459 Transient cerebral ischemic attack, unspecified: Secondary | ICD-10-CM | POA: Diagnosis not present

## 2024-10-18 DIAGNOSIS — R531 Weakness: Secondary | ICD-10-CM

## 2024-10-18 DIAGNOSIS — E1165 Type 2 diabetes mellitus with hyperglycemia: Secondary | ICD-10-CM | POA: Diagnosis not present

## 2024-10-18 DIAGNOSIS — R000 Tachycardia, unspecified: Secondary | ICD-10-CM | POA: Diagnosis not present

## 2024-10-18 DIAGNOSIS — I10 Essential (primary) hypertension: Secondary | ICD-10-CM | POA: Diagnosis not present

## 2024-10-18 DIAGNOSIS — I6782 Cerebral ischemia: Secondary | ICD-10-CM | POA: Diagnosis not present

## 2024-10-18 LAB — CBC WITH DIFFERENTIAL/PLATELET
Abs Immature Granulocytes: 0.02 K/uL (ref 0.00–0.07)
Basophils Absolute: 0 K/uL (ref 0.0–0.1)
Basophils Relative: 1 %
Eosinophils Absolute: 0 K/uL (ref 0.0–0.5)
Eosinophils Relative: 1 %
HCT: 38.9 % — ABNORMAL LOW (ref 39.0–52.0)
Hemoglobin: 12.4 g/dL — ABNORMAL LOW (ref 13.0–17.0)
Immature Granulocytes: 0 %
Lymphocytes Relative: 26 %
Lymphs Abs: 1.5 K/uL (ref 0.7–4.0)
MCH: 28.9 pg (ref 26.0–34.0)
MCHC: 31.9 g/dL (ref 30.0–36.0)
MCV: 90.7 fL (ref 80.0–100.0)
Monocytes Absolute: 0.4 K/uL (ref 0.1–1.0)
Monocytes Relative: 7 %
Neutro Abs: 3.8 K/uL (ref 1.7–7.7)
Neutrophils Relative %: 65 %
Platelets: 286 K/uL (ref 150–400)
RBC: 4.29 MIL/uL (ref 4.22–5.81)
RDW: 15.9 % — ABNORMAL HIGH (ref 11.5–15.5)
WBC: 5.8 K/uL (ref 4.0–10.5)
nRBC: 0 % (ref 0.0–0.2)

## 2024-10-18 LAB — COMPREHENSIVE METABOLIC PANEL WITH GFR
ALT: 28 U/L (ref 0–44)
AST: 24 U/L (ref 15–41)
Albumin: 3.3 g/dL — ABNORMAL LOW (ref 3.5–5.0)
Alkaline Phosphatase: 79 U/L (ref 38–126)
Anion gap: 10 (ref 5–15)
BUN: 30 mg/dL — ABNORMAL HIGH (ref 8–23)
CO2: 26 mmol/L (ref 22–32)
Calcium: 9.4 mg/dL (ref 8.9–10.3)
Chloride: 108 mmol/L (ref 98–111)
Creatinine, Ser: 1.25 mg/dL — ABNORMAL HIGH (ref 0.61–1.24)
GFR, Estimated: >60 mL/min (ref >60)
Glucose, Bld: 230 mg/dL — ABNORMAL HIGH (ref 70–99)
Potassium: 4.1 mmol/L (ref 3.5–5.1)
Sodium: 144 mmol/L (ref 135–145)
Total Bilirubin: 0.4 mg/dL (ref 0.0–1.2)
Total Protein: 7.5 g/dL (ref 6.5–8.1)

## 2024-10-18 LAB — URINALYSIS, ROUTINE W REFLEX MICROSCOPIC
Bilirubin Urine: NEGATIVE
Glucose, UA: 50 mg/dL — AB
Ketones, ur: NEGATIVE mg/dL
Leukocytes,Ua: NEGATIVE
Nitrite: NEGATIVE
Protein, ur: 30 mg/dL — AB
Specific Gravity, Urine: 1.035 — ABNORMAL HIGH (ref 1.005–1.030)
pH: 5 (ref 5.0–8.0)

## 2024-10-18 LAB — I-STAT CHEM 8, ED
BUN: 50 mg/dL — ABNORMAL HIGH (ref 8–23)
Calcium, Ion: 1.02 mmol/L — ABNORMAL LOW (ref 1.15–1.40)
Chloride: 111 mmol/L (ref 98–111)
Creatinine, Ser: 1.3 mg/dL — ABNORMAL HIGH (ref 0.61–1.24)
Glucose, Bld: 269 mg/dL — ABNORMAL HIGH (ref 70–99)
HCT: 38 % — ABNORMAL LOW (ref 39.0–52.0)
Hemoglobin: 12.9 g/dL — ABNORMAL LOW (ref 13.0–17.0)
Potassium: 8 mmol/L (ref 3.5–5.1)
Sodium: 141 mmol/L (ref 135–145)
TCO2: 27 mmol/L (ref 22–32)

## 2024-10-18 LAB — CBG MONITORING, ED: Glucose-Capillary: 218 mg/dL — ABNORMAL HIGH (ref 70–99)

## 2024-10-18 MED ORDER — LACTATED RINGERS IV BOLUS
1000.0000 mL | Freq: Once | INTRAVENOUS | Status: AC
  Administered 2024-10-18: 1000 mL via INTRAVENOUS

## 2024-10-18 MED ORDER — IOHEXOL 350 MG/ML SOLN
75.0000 mL | Freq: Once | INTRAVENOUS | Status: AC | PRN
  Administered 2024-10-18: 75 mL via INTRAVENOUS

## 2024-10-18 MED ORDER — ASPIRIN 81 MG PO CHEW: 81.0000 mg | CHEWABLE_TABLET | Freq: Every day | ORAL | 3 refills | Status: DC

## 2024-10-18 MED ORDER — CALCIUM GLUCONATE-NACL 1-0.675 GM/50ML-% IV SOLN: 1.0000 g | Freq: Once | INTRAVENOUS | Status: DC

## 2024-10-18 NOTE — Consult Note (Signed)
 NEUROLOGY CONSULT NOTE   Date of service: October 18, 2024 Patient Name: Raymond Gordon MRN:  [id number] DOB:  [birth year] Chief Complaint: I was feeling slightly off this morning. Patient does not have acute complaints on assessment. Requesting Provider: Armenta Canning, MD  History of Present Illness  Raymond Gordon is a 73 y.o. male with hx of atrial fibrillation on Eliquis , bilateral lower extremity edema, chronic diastolic CHF, DM2, HLD, HTN, OSA noncompliant with CPAP, and obesity with a BMI of 38.11 kg/m who presented to the ED this morning due to concern for confusion, irregular heart rhythm, generalized weakness, and concern for possible left mouth droop.  Patient states that when he woke up this morning he felt slightly off and not as ready to go as he usually is but thought as he got out of bed and got moving that this would improve.  Patient's sister helps look in on him during the day and when she arrives, she noted that the patient was acting drowsy and slightly confused.  She states that his phone was ringing and he acted like he did not hear it or could not answer it.  She contacted her other sister who is a engineer, civil (consulting) and when she arrived there was some concern for left mouth droop and she noted an irregular heart rhythm for which she contacted the patient's primary provider.  His PCP recommended coming to the ER for evaluation given family concerns.  During evaluation in the ED, EDP showed concern for right gaze and right visual neglect prompting neurology consultation.  CT head imaging in the ED revealed no acute intracranial abnormality with an occluded left vertebral artery and 80% stenosis of the proximal left ICA.  At baseline, patient lives alone but has 3 sisters to look in on him.  The sisters manages medications and says that he is compliant with them though they occasionally find a pill on the floor and notify him that he missed a medication.  He walks with a rollator at  baseline.  He does have CPAP but does not use it due to intolerance.   ROS  Comprehensive ROS performed and pertinent positives documented in HPI   Past History   Past Medical History:  Diagnosis Date   Atrial fibrillation with RVR (HCC) 07/08/2015   Bilateral lower extremity edema    Chronic diastolic CHF (congestive heart failure) (HCC) 08/15/2015   a. 06/2015: echo showing EF of 65-70% with Grade 2 DD   Diabetes (HCC)    INSULIN  DEPENDENT   Dysrhythmia    Hyperglycemia due to type 2 diabetes mellitus (HCC) 04/11/2021   Hyperlipidemia    Hypertension    Hypoglycemia 05/20/2014   Obesity    Obesity    Paroxysmal atrial fibrillation (HCC)    Sleep apnea    Past Surgical History:  Procedure Laterality Date   CARDIAC CATHETERIZATION     CARDIAC CATHETERIZATION N/A 2012   no large vessel occlusions   IR ANGIOGRAM SELECTIVE EACH ADDITIONAL VESSEL  08/05/2024   IR ANGIOGRAM SELECTIVE EACH ADDITIONAL VESSEL  08/05/2024   IR ANGIOGRAM VISCERAL SELECTIVE  08/05/2024   IR ANGIOGRAM VISCERAL SELECTIVE  08/05/2024   IR EMBO ARTERIAL NOT HEMORR HEMANG INC GUIDE ROADMAPPING  08/05/2024   IR RADIOLOGIST EVAL & MGMT  05/03/2024   IR RADIOLOGIST EVAL & MGMT  09/06/2024   IR US  GUIDE VASC ACCESS LEFT  08/05/2024   Family History: Family History  Problem Relation Age of Onset   Heart failure Brother  Hyperlipidemia Brother    Hypertension Brother    Stroke Brother    Hypertension Mother    Hyperlipidemia Mother    Diabetes Mother    Cancer Father        prostate   GER disease Father    Stroke Father    Hypertension Sister    Hyperlipidemia Sister    Hypertension Maternal Grandmother    Stroke Maternal Grandmother    Diabetes Maternal Grandfather    Hypertension Maternal Grandfather     Social History  reports that he quit smoking about 26 years ago. His smoking use included cigarettes. He quit smokeless tobacco use about 9 years ago. He reports that he does not drink alcohol  and  does not use drugs.  No Known Allergies  Medications  No current facility-administered medications for this encounter.  Current Outpatient Medications:    acetaminophen  (TYLENOL ) 325 MG tablet, Take 2 tablets (650 mg total) by mouth every 6 (six) hours as needed for mild pain (pain score 1-3) or fever (or Fever >/= 101)., Disp: , Rfl:    allopurinol  (ZYLOPRIM ) 300 MG tablet, Take 300 mg by mouth in the morning., Disp: , Rfl:    apixaban  (ELIQUIS ) 5 MG TABS tablet, Take 1 tablet by mouth twice daily (Patient taking differently: Take 5 mg by mouth 2 (two) times daily.), Disp: 180 tablet, Rfl: 1   atorvastatin  (LIPITOR ) 80 MG tablet, Take 1 tablet by mouth once daily (Patient taking differently: Take 80 mg by mouth in the morning.), Disp: 90 tablet, Rfl: 3   finasteride  (PROSCAR ) 5 MG tablet, Take 5 mg by mouth every evening., Disp: , Rfl:    furosemide  (LASIX ) 80 MG tablet, Take 40 mg by mouth daily., Disp: , Rfl:    hydrALAZINE  (APRESOLINE ) 25 MG tablet, Take 1 tablet (25 mg total) by mouth every 12 (twelve) hours., Disp: 60 tablet, Rfl: 0   insulin  aspart (NOVOLOG ) 100 UNIT/ML injection, Inject 0-20 Units into the skin 3 (three) times daily with meals. For glucose 121 to 150 use 3 units, for 151 to 200 use 4 units, for 201 to 250 use 7 units, for 251 to 300 use 11 units, for 301 to 350 use 15 units, for 351 to 400 use 20 units. (Patient taking differently: Inject 4 Units into the skin 3 (three) times daily with meals. For glucose 121 to 150 use 3 units, for 151 to 200 use 4 units, for 201 to 250 use 7 units, for 251 to 300 use 11 units, for 301 to 350 use 15 units, for 351 to 400 use 20 units.), Disp: 18 mL, Rfl: 0   Lancets (ONETOUCH DELICA PLUS LANCET33G) MISC, Apply topically as directed., Disp: , Rfl:    LANTUS  SOLOSTAR 100 UNIT/ML Solostar Pen, Inject 10 Units into the skin at bedtime. (Patient taking differently: Inject 15 Units into the skin at bedtime.), Disp: , Rfl:    metoprolol  tartrate  (LOPRESSOR ) 25 MG tablet, Take 25 mg by mouth 2 (two) times daily., Disp: , Rfl:    ONETOUCH VERIO test strip, 1 each by Other route as needed for other., Disp: , Rfl:    pantoprazole  (PROTONIX ) 40 MG tablet, Take 1 tablet (40 mg total) by mouth daily., Disp: 30 tablet, Rfl: 0   PRESCRIPTION MEDICATION, Inhale into the lungs at bedtime. Pt is on C Pap machine, Disp: , Rfl:   Vitals   Vitals:   10/18/24 1230 10/18/24 1245 10/18/24 1315 10/18/24 1345  BP: (!) 119/52 103/65 109/70  105/68  Pulse: 90 68 (!) 58 (!) 112  Resp: 16 (!) 21 18 (!) 22  Temp:      TempSrc:      SpO2: 100% 98% 100% 100%  Weight:      Height:        Body mass index is 38.11 kg/m.  Physical Exam   Constitutional: Appears well-developed and well-nourished.  Psych: Affect appropriate to situation. Calm and cooperative with exam Eyes: No scleral injection.  HENT: No OP obstruction.  Edentulous. Head: Normocephalic.  Cardiovascular: Atrial fibrillation on cardiac monitor Respiratory: Effort normal, non-labored breathing on room air GI: Soft.  No distension. There is no tenderness.  Skin: Scaling of the distal lower extremities bilaterally and on the nasolabial folds bilaterally  Neurologic Examination  Neuro: Mental Status: Patient is awake, alert, oriented to person, place, month, year, and situation.  Speech is dysarthric in the setting of edentulous state but is at baseline per family.  Patient is able to give a clear and coherent history No signs of aphasia or neglect Cranial Nerves: II: Visual Fields are full. Pupils are equal, round, and reactive to light.   III,IV, VI: EOMI without ptosis or diploplia.  V: Facial sensation is intact and symmetric to light touch VII: Face appears symmetric with very minimal elevation of bilateral mouth when asked to smile though family states his left mouth appears slightly droopier than his baseline. VIII: Hearing is intact to voice X: Palate elevates symmetrically,  phonation intact XI: Shoulder shrug is symmetric. XII: Tongue protrudes midline  Motor: Tone is normal. Bulk is normal. 5/5 strength was present in all four extremities.  Sensory: Sensation is symmetric to light touch and temperature in the arms and legs. No extinction to DSS present.  Cerebellar: FNF intact bilaterally without ataxia  Labs/Imaging/Neurodiagnostic studies   CBC:  Recent Labs  Lab 11-10-24 1110 11/10/2024 1116  WBC 5.8  --   NEUTROABS 3.8  --   HGB 12.4* 12.9*  HCT 38.9* 38.0*  MCV 90.7  --   PLT 286  --    Basic Metabolic Panel:  Lab Results  Component Value Date   NA 141 Nov 10, 2024   K 8.0 (HH) November 10, 2024   CO2 22 08/05/2024   GLUCOSE 269 (H) 11/10/2024   BUN 50 (H) 11-10-24   CREATININE 1.30 (H) 11/10/2024   CALCIUM  9.3 08/05/2024   GFRNONAA >60 08/05/2024   GFRAA >60 08/21/2015   Lipid Panel:  Lab Results  Component Value Date   LDLCALC 96 03/09/2024   HgbA1c:  Lab Results  Component Value Date   HGBA1C 7.9 (H) 03/08/2024   Urine Drug Screen: No results found for: LABOPIA, COCAINSCRNUR, LABBENZ, AMPHETMU, THCU, LABBARB  Alcohol  Level No results found for: [facility] INR  Lab Results  Component Value Date   INR 1.3 (H) 08/05/2024   APTT  Lab Results  Component Value Date   APTT 29 07/17/2011   CT angio Head and Neck with contrast (Personally reviewed): 1. No acute intracranial abnormality. 2. Occluded left vertebral artery. 3. 80% stenosis of the proximal left ICA. 4. Moderate to severe chronic small vessel ischemic disease.  MRI Brain: Pending   ASSESSMENT   Raymond Gordon is a 73 y.o. male with PMHx of diastolic CHF, A-fib on Eliquis , HTN, HLD, bilateral lower extremity edema, OSA noncompliant on CPAP, DM2, and obesity who presented to the ED with complaints of drowsiness, confusion, generalized weakness with findings concerning for possible left mouth droop, right gaze.  On  neurology evaluation, there is no evidence of  gaze preference, visual field loss, or unilateral weakness.  Patient's sister at bedside does report improvement/states the patient has perked up since onset of symptoms.  Family still feels that the patient has some slight left mouth asymmetry from baseline.  Presentation may be due to a small stroke or TIA. Recommend MRI brain for further evaluation.   RECOMMENDATIONS  - MRI brain without contrast, expand full stroke work up with acute findings - Resume patient's home Eliquis  for stroke prophylaxis in the setting of atrial fibrillation ______________________________________________________________________  Signed, Stevi W Toberman, NP [facility]   Attending Neurohospitalist Addendum Patient seen and examined with APP/Resident. Agree with the history and physical as documented above. Agree with the plan as documented, which I helped formulate. I have edited the note above to reflect my full findings and recommendations. I have independently reviewed the chart, obtained history, review of systems and examined the patient.I have personally reviewed pertinent head/neck/spine imaging (CT/MRI). Please feel free to call with any questions.  MRI brain wo contrast showed no acute process. Favor patient's presentation to be 2/2 TIA. L sided weakness has resolved. D/w Dr. Jerri on the stroke service. He does not need admission for TIA workup bc his relevant workup has been completed (he does not need TTE since he is compliant on eliquis  - would not change mgmt). We would however recommend adding a baby aspirin  to his eliquis  because despite him being on eliquis  he presented with signs concerning for brainstem small vessel ischemia. Patient may f/u with his PCP as scheduled.   -- Elida Ross, MD [facility] [phone]  If 7pm- 7am, please page neurology on call as listed in AMION.

## 2024-10-18 NOTE — ED Provider Notes (Signed)
  Physical Exam  BP 105/68   Pulse (!) 112   Temp 97.7 F (36.5 C) (Oral)   Resp (!) 22   Ht 5' 5 (1.651 m)   Wt 103.9 kg   SpO2 100%   BMI 38.11 kg/m   Physical Exam  Procedures  Procedures  ED Course / MDM    Medical Decision Making Amount and/or Complexity of Data Reviewed Labs: ordered. Radiology: ordered.  Risk Prescription drug management.   ***

## 2024-10-18 NOTE — ED Provider Notes (Signed)
 [place] EMERGENCY DEPARTMENT AT [facility] Provider Note   CSN: [id number] Arrival date & time: 10/18/24  1023     Patient presents with: Weakness   Raymond Gordon is a 73 y.o. male who presents to the ED today with primary complaint of generalized weakness.  According to family that is with him he was at his baseline yesterday, when they went to see him today he was feeling generally weak, patient reports general malaise as well.  He has a history of atrial fibrillation, daughter evaluated his heart rate this morning and noted that it was incredibly irregular, which along with his previous medical history prompted her to activate 911 to have the patient brought to the hospital for evaluation.  He also has previous diagnoses of essential hypertension, type 2 diabetes, chronic diastolic heart failure.  Recent echocardiogram was obtained which showed a LVEF of 55 to 60% with normal left ventricular function.  There is moderate left atrial dilation however normal MV structure.    Review of previous medical records as a admission and 10 June 2024 for AKI.  Patient's had no dysuria however had a chronic Foley catheter removed approximately 1 week prior to today.  It is noted he also had a prostatic artery embolization on 06 September 2024 for benign prostatic hypertrophy.    Weakness      Prior to Admission medications   Medication Sig Start Date End Date Taking? Authorizing Provider  acetaminophen  (TYLENOL ) 325 MG tablet Take 2 tablets (650 mg total) by mouth every 6 (six) hours as needed for mild pain (pain score 1-3) or fever (or Fever >/= 101). 06/07/24   Elgergawy, Brayton RAMAN, MD  allopurinol  (ZYLOPRIM ) 300 MG tablet Take 300 mg by mouth in the morning. 05/02/15   [provider]  apixaban  (ELIQUIS ) 5 MG TABS tablet Take 1 tablet by mouth twice daily Patient taking differently: Take 5 mg by mouth 2 (two) times daily. 09/19/23   Court Dorn PARAS, MD  atorvastatin   (LIPITOR ) 80 MG tablet Take 1 tablet by mouth once daily Patient taking differently: Take 80 mg by mouth in the morning. 06/18/23   Court Dorn PARAS, MD  finasteride  (PROSCAR ) 5 MG tablet Take 5 mg by mouth every evening. 04/11/22   [provider]  furosemide  (LASIX ) 80 MG tablet Take 40 mg by mouth daily.    [provider]  hydrALAZINE  (APRESOLINE ) 25 MG tablet Take 1 tablet (25 mg total) by mouth every 12 (twelve) hours. 06/10/24   Ghimire, Donalda HERO, MD  insulin  aspart (NOVOLOG ) 100 UNIT/ML injection Inject 0-20 Units into the skin 3 (three) times daily with meals. For glucose 121 to 150 use 3 units, for 151 to 200 use 4 units, for 201 to 250 use 7 units, for 251 to 300 use 11 units, for 301 to 350 use 15 units, for 351 to 400 use 20 units. Patient taking differently: Inject 4 Units into the skin 3 (three) times daily with meals. For glucose 121 to 150 use 3 units, for 151 to 200 use 4 units, for 201 to 250 use 7 units, for 251 to 300 use 11 units, for 301 to 350 use 15 units, for 351 to 400 use 20 units. 03/12/24   Arrien, Elidia Sieving, MD  Lancets [facility] DELICA PLUS [place]) MISC Apply topically as directed. 06/29/24   [provider]  LANTUS  SOLOSTAR 100 UNIT/ML Solostar Pen Inject 10 Units into the skin at bedtime. Patient taking differently: Inject 15  Units into the skin at bedtime. 06/10/24   Ghimire, Donalda HERO, MD  metoprolol  tartrate (LOPRESSOR ) 25 MG tablet Take 25 mg by mouth 2 (two) times daily.    [provider]  [facility] VERIO test strip 1 each by Other route as needed for other.    [provider]  pantoprazole  (PROTONIX ) 40 MG tablet Take 1 tablet (40 mg total) by mouth daily. 06/10/24   Ghimire, Donalda HERO, MD  PRESCRIPTION MEDICATION Inhale into the lungs at bedtime. Pt is on C Pap machine    [provider]    Allergies: Patient has no known allergies.    Review of Systems  Constitutional:  Positive for fatigue.   Neurological:  Positive for weakness.  All other systems reviewed and are negative.   Updated Vital Signs BP 105/68   Pulse (!) 112   Temp 97.7 F (36.5 C) (Oral)   Resp (!) 22   Ht 5' 5 (1.651 m)   Wt 103.9 kg   SpO2 100%   BMI 38.11 kg/m   Physical Exam Vitals and nursing note reviewed.  Constitutional:      General: He is not in acute distress.    Appearance: Normal appearance.  HENT:     Head: Normocephalic and atraumatic.     Mouth/Throat:     Mouth: Mucous membranes are moist.     Pharynx: Oropharynx is clear.  Eyes:     General: No visual field deficit.    Extraocular Movements: Extraocular movements intact.     Conjunctiva/sclera: Conjunctivae normal.     Pupils: Pupils are equal, round, and reactive to light.  Cardiovascular:     Rate and Rhythm: Normal rate. Rhythm irregularly irregular.     Pulses: Normal pulses.          Radial pulses are 2+ on the right side and 2+ on the left side.     Heart sounds: Normal heart sounds. No murmur heard.    No friction rub. No gallop.     Comments: Unable to assess dorsalis pedis and posterior tibialis pulse due to edema, however do note that the patient has +2 bilateral radial pulses. Pulmonary:     Effort: Pulmonary effort is normal.     Breath sounds: Normal breath sounds.  Abdominal:     General: Abdomen is flat. Bowel sounds are normal.     Palpations: Abdomen is soft.  Musculoskeletal:        General: Normal range of motion.     Cervical back: Normal range of motion and neck supple.     Right lower leg: 2+ Edema present.     Left lower leg: 2+ Edema present.  Skin:    General: Skin is warm and dry.     Capillary Refill: Capillary refill takes less than 2 seconds.     Comments: There is noted lichenification and scaling of the distal lower extremities bilaterally, also skin of the face there is noted papular rash across the [place] distribution, also does have some scaling, white thick scaling to the nasolabial  fold.  Per patient's family this is chronic.  Neurological:     General: No focal deficit present.     Mental Status: He is alert and oriented to person, place, and time. Mental status is at baseline.     GCS: GCS eye subscore is 4. GCS verbal subscore is 5. GCS motor subscore is 6.     Cranial Nerves: No dysarthria.     Comments:  On the neurologic exam there is notable neglect to the right visual field, otherwise pupils equal and reactive bilaterally, extraocular motions intact except for previously mentioned neglect with right lateral gaze.  Sensation is intact in all 4 extremities to both sharp and dull sensation, however it is noted that he does have decreased motor function on the left compared to right with 3 out of 5 strength compared to 5/5 strength in the left versus right.  Due to generalized weakness unable to ambulate the patient to evaluate gait.  Psychiatric:        Mood and Affect: Mood normal.     (all labs ordered are listed, but only abnormal results are displayed) Labs Reviewed  CBC WITH DIFFERENTIAL/PLATELET - Abnormal; Notable for the following components:      Result Value   Hemoglobin 12.4 (*)    HCT 38.9 (*)    RDW 15.9 (*)    All other components within normal limits  URINALYSIS, ROUTINE W REFLEX MICROSCOPIC - Abnormal; Notable for the following components:   APPearance HAZY (*)    Specific Gravity, Urine 1.035 (*)    Glucose, UA 50 (*)    Hgb urine dipstick SMALL (*)    Protein, ur 30 (*)    Bacteria, UA RARE (*)    All other components within normal limits  COMPREHENSIVE METABOLIC PANEL WITH GFR - Abnormal; Notable for the following components:   Glucose, Bld 230 (*)    BUN 30 (*)    Creatinine, Ser 1.25 (*)    Albumin 3.3 (*)    All other components within normal limits  CBG MONITORING, ED - Abnormal; Notable for the following components:   Glucose-Capillary 218 (*)    All other components within normal limits  I-STAT CHEM 8, ED - Abnormal; Notable  for the following components:   Potassium 8.0 (*)    BUN 50 (*)    Creatinine, Ser 1.30 (*)    Glucose, Bld 269 (*)    Calcium , Ion 1.02 (*)    Hemoglobin 12.9 (*)    HCT 38.0 (*)    All other components within normal limits  URINE CULTURE    EKG: EKG Interpretation Date/Time:  Monday October 18 2024 11:15:35 EST Ventricular Rate:  116 PR Interval:  119 QRS Duration:  132 QT Interval:  329 QTC Calculation: 449 R Axis:   22  Text Interpretation: Atrial fibrillation Nonspecific intraventricular conduction delay Artifact in lead(s) I II III aVR aVL aVF V1 V2 no sig change Confirmed by Armenta Canning [phone]) on 10/18/2024 11:18:54 AM  Radiology: CT Angio Head Neck W WO CM Result Date: 10/18/2024 EXAM: CT HEAD WITHOUT CTA HEAD AND NECK WITH AND WITHOUT 10/18/2024 11:42:03 AM TECHNIQUE: CTA of the head and neck was performed with and without the administration of 75 mL of intravenous iohexol  (OMNIPAQUE ) 350 MG/ML injection. Noncontrast CT of the head with reconstructed 2-D images are also provided for review. Multiplanar 2D and/or 3D reformatted images are provided for review. Automated exposure control, iterative reconstruction, and/or weight based adjustment of the mA/kV was utilized to reduce the radiation dose to as low as reasonably achievable. COMPARISON: CT head 05/31/2024 CLINICAL HISTORY: Neuro deficit, acute, stroke suspected. Generalized weakness. FINDINGS: CT HEAD: BRAIN AND VENTRICLES: There is no evidence of an acute infarct, intracranial hemorrhage, mass, midline shift, hydrocephalus, or extra-axial fluid collection. There is mild cerebral atrophy. Patchy and confluent hypodensities in the cerebral white matter bilaterally are similar to the prior CT and nonspecific but  compatible with moderate to severe chronic small vessel ischemic disease. Chronic lacunar infarcts in the right thalamus and right cerebellar hemisphere are unchanged. ORBITS: Remote medial right orbital  fracture. Bilateral cataract extraction. SINUSES AND MASTOIDS: Mild mucosal thickening in the left maxillary sinus. Clear mastoid air cells. CTA NECK: AORTIC ARCH AND ARCH VESSELS: No dissection or arterial injury. No significant stenosis of the brachiocephalic or subclavian arteries. CERVICAL CAROTID ARTERIES: Small amount of mixed plaque in the proximal right ICA without significant associated stenosis. More extensive, predominantly calcified plaque in the proximal left ICA resulting in 80% stenosis. CERVICAL VERTEBRAL ARTERIES: The right vertebral artery is patent with calcified plaque at its origin not resulting in a flow limiting stenosis. The left vertebral artery is occluded at its origin without reconstitution in the neck. LUNGS AND MEDIASTINUM: Unremarkable. SOFT TISSUES: No acute abnormality. BONES: Mild cervical spondylosis. Focally advanced right facet arthrosis at C2-C3. CTA HEAD: ANTERIOR CIRCULATION: The intracranial internal carotid arteries are patent with minimal calcified plaque bilaterally not resulting in significant stenosis. ACAs and MCAs are patent without evidence of a proximal branch occlusion or significant proximal stenosis. A normal variant duplicated left MCA is noted. No aneurysm. POSTERIOR CIRCULATION: The intracranial right vertebral artery is widely patent and supplies the basilar. The intracranial left vertebral artery is occluded. Patent AICA and SCA origins are visualized bilaterally. The basilar artery is patent and congenitally small without evidence of a significant focal stenosis. There are large posterior communicating arteries and hypoplastic P1 segments bilaterally. Both PCAs are patent without evidence of a significant proximal stenosis. No aneurysm. OTHER: No dural venous sinus thrombosis on this non-dedicated study. IMPRESSION: 1. No acute intracranial abnormality. 2. Occluded left vertebral artery. 3. 80% stenosis of the proximal left ICA. 4. Moderate to severe chronic  small vessel ischemic disease. Electronically signed by: Dasie Hamburg MD 10/18/2024 12:04 PM EST RP Workstation: [id number]     Procedures   Medications Ordered in the ED  lactated ringers  bolus 1,000 mL (has no administration in time range)  iohexol  (OMNIPAQUE ) 350 MG/ML injection 75 mL (75 mLs Intravenous Contrast Given 10/18/24 1142)                                   Medical Decision Making Amount and/or Complexity of Data Reviewed Labs: ordered. Radiology: ordered.  Risk Prescription drug management.   Medical Decision Making:   JEOVANNI HEURING is a 73 y.o. male who presented to the ED today with acute weakness detailed above.    Additional history discussed with patient's family/caregivers.  External chart has been reviewed including previous imaging, labs. Patient's presentation is complicated by their history of atrial fibrillation, type 2 diabetes, essential hypertension, CHF.  Patient placed on continuous vitals and telemetry monitoring while in ED which was reviewed periodically.  Complete initial physical exam performed, notably the patient  was alert oriented no apparent distress, neurologic exam does show left-sided motor weakness as well as right-sided visual neglect.    Reviewed and confirmed nursing documentation for past medical history, family history, social history.    Initial Assessment:   With the patient's presentation of weakness, consider acute neurovascular insult given neurologic findings, also consider possible UTI, electrolyte derangement, CHF or COPD exacerbation. This is most consistent with an acute complicated illness  Initial Plan:  Obtain CT angiography of the brain to evaluate for acute vascular occlusion/neurovascular infarct. Screening labs including CBC and Metabolic panel to  evaluate for infectious or metabolic etiology of disease.  Urinalysis with reflex culture ordered to evaluate for UTI or relevant urologic/nephrologic pathology.  CXR  to evaluate for structural/infectious intrathoracic pathology.  EKG to evaluate for cardiac pathology Objective evaluation as below reviewed   Initial Study Results:   Laboratory  All laboratory results reviewed without evidence of clinically relevant pathology.   Exceptions include: Glucose is 230, creatinine 1.25 with normal GFR.  Hemoglobin 12.4 with hematocrit of 30.9.  EKG EKG was reviewed independently. Rate, rhythm, axis, intervals all examined and without medically relevant abnormality. ST segments without concerns for elevations.    Radiology:  All images reviewed independently. Agree with radiology report at this time.   CT Angio Head Neck W WO CM Result Date: 10/18/2024 EXAM: CT HEAD WITHOUT CTA HEAD AND NECK WITH AND WITHOUT 10/18/2024 11:42:03 AM TECHNIQUE: CTA of the head and neck was performed with and without the administration of 75 mL of intravenous iohexol  (OMNIPAQUE ) 350 MG/ML injection. Noncontrast CT of the head with reconstructed 2-D images are also provided for review. Multiplanar 2D and/or 3D reformatted images are provided for review. Automated exposure control, iterative reconstruction, and/or weight based adjustment of the mA/kV was utilized to reduce the radiation dose to as low as reasonably achievable. COMPARISON: CT head 05/31/2024 CLINICAL HISTORY: Neuro deficit, acute, stroke suspected. Generalized weakness. FINDINGS: CT HEAD: BRAIN AND VENTRICLES: There is no evidence of an acute infarct, intracranial hemorrhage, mass, midline shift, hydrocephalus, or extra-axial fluid collection. There is mild cerebral atrophy. Patchy and confluent hypodensities in the cerebral white matter bilaterally are similar to the prior CT and nonspecific but compatible with moderate to severe chronic small vessel ischemic disease. Chronic lacunar infarcts in the right thalamus and right cerebellar hemisphere are unchanged. ORBITS: Remote medial right orbital fracture. Bilateral cataract  extraction. SINUSES AND MASTOIDS: Mild mucosal thickening in the left maxillary sinus. Clear mastoid air cells. CTA NECK: AORTIC ARCH AND ARCH VESSELS: No dissection or arterial injury. No significant stenosis of the brachiocephalic or subclavian arteries. CERVICAL CAROTID ARTERIES: Small amount of mixed plaque in the proximal right ICA without significant associated stenosis. More extensive, predominantly calcified plaque in the proximal left ICA resulting in 80% stenosis. CERVICAL VERTEBRAL ARTERIES: The right vertebral artery is patent with calcified plaque at its origin not resulting in a flow limiting stenosis. The left vertebral artery is occluded at its origin without reconstitution in the neck. LUNGS AND MEDIASTINUM: Unremarkable. SOFT TISSUES: No acute abnormality. BONES: Mild cervical spondylosis. Focally advanced right facet arthrosis at C2-C3. CTA HEAD: ANTERIOR CIRCULATION: The intracranial internal carotid arteries are patent with minimal calcified plaque bilaterally not resulting in significant stenosis. ACAs and MCAs are patent without evidence of a proximal branch occlusion or significant proximal stenosis. A normal variant duplicated left MCA is noted. No aneurysm. POSTERIOR CIRCULATION: The intracranial right vertebral artery is widely patent and supplies the basilar. The intracranial left vertebral artery is occluded. Patent AICA and SCA origins are visualized bilaterally. The basilar artery is patent and congenitally small without evidence of a significant focal stenosis. There are large posterior communicating arteries and hypoplastic P1 segments bilaterally. Both PCAs are patent without evidence of a significant proximal stenosis. No aneurysm. OTHER: No dural venous sinus thrombosis on this non-dedicated study. IMPRESSION: 1. No acute intracranial abnormality. 2. Occluded left vertebral artery. 3. 80% stenosis of the proximal left ICA. 4. Moderate to severe chronic small vessel ischemic  disease. Electronically signed by: Dasie Hamburg MD 10/18/2024 12:04 PM EST  RP Workstation: [id number]      Consults: Case discussed with Dr. Matthews with neurology.   Reassessment and Plan:   Given the neurologic findings on the physical exam, CTA was obtained to evaluate for potential neurovascular injury, vascular occlusion.  This shows an occluded left vertebral artery as well as 80% stenosis of the proximal left ICA.  There is also some moderate to severe small vessel ischemic disease.  Consulted with neurology as noted, they are following the patient and will have MRI imaging pending, disposition pending their assessment.  Reevaluated patient, he subjectively feels improved however has not had any resolution of the previously noted weakness.  Care signed out to B.Skinner, DO.  Disposition at signout is that neurology will reassess after MRI, may be admission however dependent on imaging findings may discharge with outpatient follow-up.       Final diagnoses:  Weakness  Occlusion of left vertebral artery    ED Discharge Orders     None          Myriam Dorn BROCKS, [place] 10/18/24 1622    Armenta Canning, MD 10/19/24 [phone]

## 2024-10-18 NOTE — ED Notes (Signed)
 Cm 8 results to jasmine h.rn by at

## 2024-10-18 NOTE — ED Notes (Signed)
 Pt and family received discharge instructions. Verbalized understanding. Pt taken out in a wheelchair.

## 2024-10-18 NOTE — Discharge Instructions (Addendum)
 I discussed the plan for discharge with the patient and/or their surrogate at bedside prior to discharge and they were in agreement with the plan and verbalized understanding of the return precautions provided. All questions answered to the best of my ability. Ultimately, the patient was discharged in stable condition with stable vital signs. I am reassured that they are capable of close follow up and good social support at home.   I would recommend you follow-up with neurology in outpatient setting.  Imaging is overall reassuring against evidence of acute stroke at this time.  However, you are at a much higher risk of developing strokes in the future, I would recommend continue taking your Eliquis , and I am also starting you on a baby aspirin  today.

## 2024-10-18 NOTE — ED Triage Notes (Signed)
 Pt brought to ED by [facility]. Pt is coming from home and reports generalized weakness starting this morning. No complaints of dizziness or SHOB. Pt has a history of Afib.  EMS vitals CBG 329 BP 132/90 O2 98% on RA.

## 2024-10-19 DIAGNOSIS — E113393 Type 2 diabetes mellitus with moderate nonproliferative diabetic retinopathy without macular edema, bilateral: Secondary | ICD-10-CM | POA: Diagnosis not present

## 2024-10-19 DIAGNOSIS — E1165 Type 2 diabetes mellitus with hyperglycemia: Secondary | ICD-10-CM | POA: Diagnosis not present

## 2024-10-19 DIAGNOSIS — E1151 Type 2 diabetes mellitus with diabetic peripheral angiopathy without gangrene: Secondary | ICD-10-CM | POA: Diagnosis not present

## 2024-10-20 DIAGNOSIS — N401 Enlarged prostate with lower urinary tract symptoms: Secondary | ICD-10-CM | POA: Diagnosis not present

## 2024-10-20 DIAGNOSIS — R338 Other retention of urine: Secondary | ICD-10-CM | POA: Diagnosis not present

## 2024-10-20 LAB — URINE CULTURE: Culture: 30000 — AB

## 2024-10-21 ENCOUNTER — Telehealth (HOSPITAL_BASED_OUTPATIENT_CLINIC_OR_DEPARTMENT_OTHER): Payer: Self-pay | Admitting: *Deleted

## 2024-10-21 NOTE — Telephone Encounter (Signed)
 Post ED Visit - Positive Culture Follow-up  Culture report reviewed by antimicrobial stewardship pharmacist: [facility] [x]  Dorn Buttner, Pharm.D. []  Venetia Gully, Pharm.D., BCPS AQ-ID []  Garrel Crews, Pharm.D., BCPS []  Almarie Lunger, Pharm.D., BCPS []  [place], [address].D., BCPS, AAHIVP []  Rosaline Bihari, Pharm.D., BCPS, AAHIVP []  Vernell Meier, PharmD, BCPS []  Latanya Hint, PharmD, BCPS []  Donald Medley, PharmD, BCPS []  Rocky Bold, PharmD []  Dorothyann Alert, PharmD, BCPS []  Morene Babe, PharmD  [facility] []  Rosaline Edison, PharmD []  Romona Bliss, PharmD []  Dolphus Roller, PharmD []  Veva Seip, Rph []  Vernell Daunt) Leonce, PharmD []  Eva Allis, PharmD []  Rosaline Millet, PharmD []  Iantha Batch, PharmD []  Arvin Gauss, PharmD []  Wanda Hasting, PharmD []  Ronal Rav, PharmD []  Rocky Slade, PharmD []  Bard Jeans, PharmD   Positive urine culture reviewed by [facility] Treated with no abx  and no if patient symptoms return instructed to come back to ED Pt followed up with Urologist yesterday-No complaints of any urinary symptoms.  SABRA Ruth, Wyman Kipper 10/21/2024, 4:22 PM

## 2024-10-21 NOTE — Progress Notes (Signed)
 ED Antimicrobial Stewardship Positive Culture Follow Up   Raymond Gordon is an 73 y.o. male who presented to [facility] on @ADMITDT @ with a chief complaint of  Chief Complaint  Patient presents with   Weakness    Recent Results (from the past 720 hours)  Urine Culture     Status: Abnormal   Collection Time: 10/18/24 10:53 AM   Specimen: Urine, Clean Catch  Result Value Ref Range Status   Specimen Description URINE, CLEAN CATCH  Final   Special Requests   Final    NONE Performed at [facility], [address], [place], [name] [address]    Culture 30,000 COLONIES/mL ENTEROCOCCUS FAECIUM (A)  Final   Report Status 10/20/2024 FINAL  Final   Organism ID, Bacteria ENTEROCOCCUS FAECIUM (A)  Final      Susceptibility   Enterococcus faecium - MIC*    AMPICILLIN >=32 RESISTANT Resistant     NITROFURANTOIN 64 INTERMEDIATE Intermediate     VANCOMYCIN <=0.5 SENSITIVE Sensitive     * 30,000 COLONIES/mL ENTEROCOCCUS FAECIUM    [x]  Culture above likely asymptomatic bacteriuria. MD would like symptom check for UTI.  IF NO URINARY SYMPTOMS >> No treatment needed IF URINARY SYMPTOMS PRESENT >> Call in 3g PO fosfomycin x 1 dose and give return precautions if no improvement seen after taking dose.\  ED Provider: [facility] MD   Dorn Buttner, PharmD, BCPS 10/21/2024 8:51 AM ED Clinical Pharmacist -  [phone]

## 2024-11-12 ENCOUNTER — Inpatient Hospital Stay (HOSPITAL_COMMUNITY)
Admission: EM | Admit: 2024-11-12 | Discharge: 2024-11-30 | DRG: 069 | Disposition: A | Discharge status: Skilled Nursing Facility | Attending: Family Medicine | Admitting: Family Medicine

## 2024-11-12 ENCOUNTER — Emergency Department (HOSPITAL_COMMUNITY)

## 2024-11-12 ENCOUNTER — Other Ambulatory Visit: Payer: Self-pay

## 2024-11-12 DIAGNOSIS — Z7901 Long term (current) use of anticoagulants: Secondary | ICD-10-CM

## 2024-11-12 DIAGNOSIS — E1122 Type 2 diabetes mellitus with diabetic chronic kidney disease: Secondary | ICD-10-CM | POA: Diagnosis present

## 2024-11-12 DIAGNOSIS — G459 Transient cerebral ischemic attack, unspecified: Secondary | ICD-10-CM | POA: Diagnosis not present

## 2024-11-12 DIAGNOSIS — I69892 Facial weakness following other cerebrovascular disease: Secondary | ICD-10-CM

## 2024-11-12 DIAGNOSIS — Z87891 Personal history of nicotine dependence: Secondary | ICD-10-CM

## 2024-11-12 DIAGNOSIS — I4891 Unspecified atrial fibrillation: Secondary | ICD-10-CM | POA: Diagnosis not present

## 2024-11-12 DIAGNOSIS — N1831 Chronic kidney disease, stage 3a: Secondary | ICD-10-CM | POA: Diagnosis present

## 2024-11-12 DIAGNOSIS — Z8249 Family history of ischemic heart disease and other diseases of the circulatory system: Secondary | ICD-10-CM

## 2024-11-12 DIAGNOSIS — E785 Hyperlipidemia, unspecified: Secondary | ICD-10-CM | POA: Diagnosis present

## 2024-11-12 DIAGNOSIS — Z9181 History of falling: Secondary | ICD-10-CM

## 2024-11-12 DIAGNOSIS — G4733 Obstructive sleep apnea (adult) (pediatric): Secondary | ICD-10-CM | POA: Diagnosis present

## 2024-11-12 DIAGNOSIS — N400 Enlarged prostate without lower urinary tract symptoms: Secondary | ICD-10-CM | POA: Diagnosis present

## 2024-11-12 DIAGNOSIS — N179 Acute kidney failure, unspecified: Secondary | ICD-10-CM | POA: Diagnosis present

## 2024-11-12 DIAGNOSIS — Z79899 Other long term (current) drug therapy: Secondary | ICD-10-CM

## 2024-11-12 DIAGNOSIS — E66812 Obesity, class 2: Secondary | ICD-10-CM | POA: Diagnosis present

## 2024-11-12 DIAGNOSIS — I130 Hypertensive heart and chronic kidney disease with heart failure and stage 1 through stage 4 chronic kidney disease, or unspecified chronic kidney disease: Secondary | ICD-10-CM | POA: Diagnosis present

## 2024-11-12 DIAGNOSIS — I69344 Monoplegia of lower limb following cerebral infarction affecting left non-dominant side: Secondary | ICD-10-CM

## 2024-11-12 DIAGNOSIS — G9341 Metabolic encephalopathy: Secondary | ICD-10-CM | POA: Diagnosis present

## 2024-11-12 DIAGNOSIS — M4802 Spinal stenosis, cervical region: Secondary | ICD-10-CM | POA: Diagnosis present

## 2024-11-12 DIAGNOSIS — K5900 Constipation, unspecified: Secondary | ICD-10-CM | POA: Diagnosis present

## 2024-11-12 DIAGNOSIS — G458 Other transient cerebral ischemic attacks and related syndromes: Principal | ICD-10-CM | POA: Diagnosis present

## 2024-11-12 DIAGNOSIS — Z6839 Body mass index (BMI) 39.0-39.9, adult: Secondary | ICD-10-CM

## 2024-11-12 DIAGNOSIS — Z794 Long term (current) use of insulin: Secondary | ICD-10-CM

## 2024-11-12 DIAGNOSIS — I69828 Other speech and language deficits following other cerebrovascular disease: Secondary | ICD-10-CM

## 2024-11-12 DIAGNOSIS — Z83438 Family history of other disorder of lipoprotein metabolism and other lipidemia: Secondary | ICD-10-CM

## 2024-11-12 DIAGNOSIS — R296 Repeated falls: Secondary | ICD-10-CM

## 2024-11-12 DIAGNOSIS — D631 Anemia in chronic kidney disease: Secondary | ICD-10-CM | POA: Diagnosis present

## 2024-11-12 DIAGNOSIS — I480 Paroxysmal atrial fibrillation: Secondary | ICD-10-CM | POA: Diagnosis present

## 2024-11-12 DIAGNOSIS — Z7982 Long term (current) use of aspirin: Secondary | ICD-10-CM

## 2024-11-12 DIAGNOSIS — M50323 Other cervical disc degeneration at C6-C7 level: Secondary | ICD-10-CM | POA: Diagnosis present

## 2024-11-12 DIAGNOSIS — M5126 Other intervertebral disc displacement, lumbar region: Secondary | ICD-10-CM | POA: Diagnosis present

## 2024-11-12 DIAGNOSIS — R001 Bradycardia, unspecified: Secondary | ICD-10-CM | POA: Diagnosis present

## 2024-11-12 DIAGNOSIS — R29898 Other symptoms and signs involving the musculoskeletal system: Secondary | ICD-10-CM

## 2024-11-12 DIAGNOSIS — M109 Gout, unspecified: Secondary | ICD-10-CM | POA: Diagnosis present

## 2024-11-12 DIAGNOSIS — F0390 Unspecified dementia without behavioral disturbance: Secondary | ICD-10-CM | POA: Diagnosis present

## 2024-11-12 DIAGNOSIS — F05 Delirium due to known physiological condition: Secondary | ICD-10-CM | POA: Diagnosis present

## 2024-11-12 DIAGNOSIS — M47816 Spondylosis without myelopathy or radiculopathy, lumbar region: Secondary | ICD-10-CM | POA: Diagnosis present

## 2024-11-12 DIAGNOSIS — E875 Hyperkalemia: Secondary | ICD-10-CM | POA: Diagnosis present

## 2024-11-12 DIAGNOSIS — Z823 Family history of stroke: Secondary | ICD-10-CM

## 2024-11-12 DIAGNOSIS — Z833 Family history of diabetes mellitus: Secondary | ICD-10-CM

## 2024-11-12 DIAGNOSIS — I5032 Chronic diastolic (congestive) heart failure: Secondary | ICD-10-CM | POA: Diagnosis present

## 2024-11-12 DIAGNOSIS — R21 Rash and other nonspecific skin eruption: Secondary | ICD-10-CM | POA: Diagnosis present

## 2024-11-12 LAB — COMPREHENSIVE METABOLIC PANEL WITH GFR
ALT: 42 U/L (ref 0–44)
AST: 51 U/L — ABNORMAL HIGH (ref 15–41)
Albumin: 3.7 g/dL (ref 3.5–5.0)
Alkaline Phosphatase: 103 U/L (ref 38–126)
Anion gap: 12 (ref 5–15)
BUN: 42 mg/dL — ABNORMAL HIGH (ref 8–23)
CO2: 22 mmol/L (ref 22–32)
Calcium: 10.5 mg/dL — ABNORMAL HIGH (ref 8.9–10.3)
Chloride: 103 mmol/L (ref 98–111)
Creatinine, Ser: 1.14 mg/dL (ref 0.61–1.24)
GFR, Estimated: >60 mL/min
Glucose, Bld: 161 mg/dL — ABNORMAL HIGH (ref 70–99)
Potassium: 5.5 mmol/L — ABNORMAL HIGH (ref 3.5–5.1)
Sodium: 138 mmol/L (ref 135–145)
Total Bilirubin: 0.3 mg/dL (ref 0.0–1.2)
Total Protein: 8 g/dL (ref 6.5–8.1)

## 2024-11-12 LAB — URINALYSIS, W/ REFLEX TO CULTURE (INFECTION SUSPECTED)
Bilirubin Urine: NEGATIVE
Glucose, UA: NEGATIVE mg/dL
Ketones, ur: NEGATIVE mg/dL
Leukocytes,Ua: NEGATIVE
Nitrite: NEGATIVE
Protein, ur: NEGATIVE mg/dL
Specific Gravity, Urine: 1.01 (ref 1.005–1.030)
pH: 6 (ref 5.0–8.0)

## 2024-11-12 LAB — CBC
HCT: 41.6 % (ref 39.0–52.0)
Hemoglobin: 13.2 g/dL (ref 13.0–17.0)
MCH: 29 pg (ref 26.0–34.0)
MCHC: 31.7 g/dL (ref 30.0–36.0)
MCV: 91.4 fL (ref 80.0–100.0)
Platelets: 213 K/uL (ref 150–400)
RBC: 4.55 MIL/uL (ref 4.22–5.81)
RDW: 15.9 % — ABNORMAL HIGH (ref 11.5–15.5)
WBC: 5.5 K/uL (ref 4.0–10.5)
nRBC: 0 % (ref 0.0–0.2)

## 2024-11-12 LAB — URINE DRUG SCREEN
Amphetamines: NEGATIVE
Barbiturates: NEGATIVE
Benzodiazepines: NEGATIVE
Cocaine: NEGATIVE
Fentanyl: NEGATIVE
Methadone Scn, Ur: NEGATIVE
Opiates: NEGATIVE
Tetrahydrocannabinol: NEGATIVE

## 2024-11-12 LAB — ETHANOL: Alcohol, Ethyl (B): <15 mg/dL

## 2024-11-12 LAB — PROTIME-INR
INR: 1.2 (ref 0.8–1.2)
Prothrombin Time: 15.8 s — ABNORMAL HIGH (ref 11.4–15.2)

## 2024-11-12 LAB — DIFFERENTIAL
Abs Immature Granulocytes: 0.01 K/uL (ref 0.00–0.07)
Basophils Absolute: 0 K/uL (ref 0.0–0.1)
Basophils Relative: 0 %
Eosinophils Absolute: 0 K/uL (ref 0.0–0.5)
Eosinophils Relative: 1 %
Immature Granulocytes: 0 %
Lymphocytes Relative: 23 %
Lymphs Abs: 1.3 K/uL (ref 0.7–4.0)
Monocytes Absolute: 0.3 K/uL (ref 0.1–1.0)
Monocytes Relative: 6 %
Neutro Abs: 3.9 K/uL (ref 1.7–7.7)
Neutrophils Relative %: 70 %

## 2024-11-12 LAB — CK: Total CK: 92 U/L (ref 49–397)

## 2024-11-12 LAB — APTT: aPTT: 40 s — ABNORMAL HIGH (ref 24–36)

## 2024-11-12 MED ORDER — STROKE: EARLY STAGES OF RECOVERY BOOK: Freq: Once | Status: AC

## 2024-11-12 MED ORDER — IOHEXOL 350 MG/ML SOLN
75.0000 mL | Freq: Once | INTRAVENOUS | Status: AC | PRN
  Administered 2024-11-12: 75 mL via INTRAVENOUS

## 2024-11-12 NOTE — ED Triage Notes (Signed)
 [facility] arriving with patient from home.   The patient is having slurred speech and R sided facial droop. LKN 1200 today. Onset time 1300. AxOx4, no weakness, LVO scale negative per EMS.   EMS vitals  and interventions:  BP: 160/70 HR 60 O2: 98 on RA CBG 180 RR 18 18G L Forearm

## 2024-11-12 NOTE — Consult Note (Addendum)
 NEUROLOGY CONSULT NOTE   Date of service: November 12, 2024 Patient Name: Raymond Gordon MRN:  [id number] DOB:  [birth year] Chief Complaint: R facial droop and slurred speech Requesting Provider: Patt Alm Macho, MD  History of Present Illness  Raymond Gordon is a 73 y.o. male with hx of afibb on eliquis , HTN, HLD, DM2, who was at his baseline this AM. Saw his doctor and got home at noon. Sister left at 230PM and patient was at his baseline and watching TV at that time. Family called but he did not answer his phone around 1600, so they went in to check on him at 5pm and he was in the bathroom. He was sitting on the toilet and unable to get up. Report he tried for a while. His left leg was weak and giving him trouble. He could not bear his weight on that leg. It was not hurting.  EMS called and they got him up and he was brought in to the ED. There was initial report in the triage note about a R facial droop but patient and sister deny it. Sister tried to get him to put his arms up in the air and noted that left arm was lower than the right arm.  Upon further questioning, patient thinks that the left leg weakness has atleast been going on for a few months. He did have a couple falls in the last few months. He also endorses having had a foley cath for a really long time and just recently came out. He could not get to bathroom in time and the urine would dribble. He was told that his prostrate is large. He also endorses some constipation and he does not volunteer information but sister at the bedside states that he has been on stool softeners for some time.  He had MRI Brain which was negative for any acute abnormalities. Prior CT A head and neck from last month with 80% L ICA stenosis.  LKW: 1430 Modified rankin score: 3-Moderate disability-requires help but walks WITHOUT assistance IV Thrombolysis: not offered, on eliquis . EVT: not offered, no LVO  NIHSS components Score: Comment  1a Level of  Conscious 0[x]  1[]  2[]  3[]      1b LOC Questions 0[x]  1[]  2[]       1c LOC Commands 0[x]  1[]  2[]       2 Best Gaze 0[x]  1[]  2[]       3 Visual 0[x]  1[]  2[]  3[]      4 Facial Palsy 0[x]  1[]  2[]  3[]      5a Motor Arm - left 0[x]  1[]  2[]  3[]  4[]  UN[]    5b Motor Arm - Right 0[x]  1[]  2[]  3[]  4[]  UN[]    6a Motor Leg - Left 0[]  1[]  2[]  3[x]  4[]  UN[]    6b Motor Leg - Right 0[]  1[x]  2[]  3[]  4[]  UN[]    7 Limb Ataxia 0[x]  1[]  2[]  UN[]      8 Sensory 0[x]  1[]  2[]  UN[]      9 Best Language 0[x]  1[]  2[]  3[]      10 Dysarthria 0[]  1[x]  2[]  UN[]      11 Extinct. and Inattention 0[x]  1[]  2[]       TOTAL: 5      ROS  Comprehensive ROS performed and pertinent positives documented in HPI   Past History   Past Medical History:  Diagnosis Date   Atrial fibrillation with RVR (HCC) 07/08/2015   Bilateral lower extremity edema    Chronic diastolic CHF (congestive heart failure) (HCC) 08/15/2015   a. 06/2015:  echo showing EF of 65-70% with Grade 2 DD   Diabetes (HCC)    INSULIN  DEPENDENT   Dysrhythmia    Hyperglycemia due to type 2 diabetes mellitus (HCC) 04/11/2021   Hyperlipidemia    Hypertension    Hypoglycemia 05/20/2014   Obesity    Obesity    Paroxysmal atrial fibrillation (HCC)    Sleep apnea     Past Surgical History:  Procedure Laterality Date   CARDIAC CATHETERIZATION     CARDIAC CATHETERIZATION N/A 2012   no large vessel occlusions   IR ANGIOGRAM SELECTIVE EACH ADDITIONAL VESSEL  08/05/2024   IR ANGIOGRAM SELECTIVE EACH ADDITIONAL VESSEL  08/05/2024   IR ANGIOGRAM VISCERAL SELECTIVE  08/05/2024   IR ANGIOGRAM VISCERAL SELECTIVE  08/05/2024   IR EMBO ARTERIAL NOT HEMORR HEMANG INC GUIDE ROADMAPPING  08/05/2024   IR RADIOLOGIST EVAL & MGMT  05/03/2024   IR RADIOLOGIST EVAL & MGMT  09/06/2024   IR US  GUIDE VASC ACCESS LEFT  08/05/2024    Family History: Family History  Problem Relation Age of Onset   Heart failure Brother    Hyperlipidemia Brother    Hypertension Brother    Stroke Brother     Hypertension Mother    Hyperlipidemia Mother    Diabetes Mother    Cancer Father        prostate   GER disease Father    Stroke Father    Hypertension Sister    Hyperlipidemia Sister    Hypertension Maternal Grandmother    Stroke Maternal Grandmother    Diabetes Maternal Grandfather    Hypertension Maternal Grandfather     Social History  reports that he quit smoking about 26 years ago. His smoking use included cigarettes. He quit smokeless tobacco use about 9 years ago. He reports that he does not drink alcohol  and does not use drugs.  Allergies[1]  Medications  Current Medications[2]  Vitals   Vitals:   11/12/24 1855 11/12/24 1900 11/12/24 2030 11/12/24 2100  BP: (!) 158/53 (!) 142/51 (!) 148/108 (!) 144/49  Pulse: (!) 59 (!) 57 (!) 56 (!) 48  Resp: 16 (!) 27 (!) 7 16  SpO2: 100% 92% 100% 100%    There is no height or weight on file to calculate BMI.   Physical Exam   General: Laying comfortably in bed; in no acute distress.  HENT: Normal oropharynx and mucosa. Normal external appearance of ears and nose.  Neck: Supple, no pain or tenderness  CV: No JVD. No peripheral edema.  Pulmonary: Symmetric Chest rise. Normal respiratory effort.  Abdomen: Soft to touch, non-tender.  Ext: No cyanosis, or deformity. Has chronic scaling and lymphedema BL Skin: scaling in BL legs. No rash otherwise. Normal palpation of skin.  Musculoskeletal: Normal digits and nails by inspection. No clubbing.  Neurologic Examination  Mental status/Cognition: Alert, oriented to self, place, month and year, good attention. Speech/language: slurred, fluent, comprehension intact, object naming intact, repetition intact.  Cranial nerves:   CN II Pupils equal and reactive to light, no VF deficits    CN III,IV,VI EOM intact, no gaze preference or deviation, no nystagmus    CN V normal sensation in V1, V2, and V3 segments bilaterally    CN VII no asymmetry, no nasolabial fold flattening    CN  VIII normal hearing to speech    CN IX & X normal palatal elevation, no uvular deviation    CN XI 5/5 head turn and 5/5 shoulder shrug bilaterally    CN  XII midline tongue protrusion    Motor:  Muscle bulk: normal, tone normal, pronator drift none tremor none Mvmt Root Nerve  Muscle Right Left Comments  SA C5/6 Ax Deltoid 4+ 4+   EF C5/6 Mc Biceps 5 5   EE C6/7/8 Rad Triceps 5 5   WF C6/7 Med FCR     WE C7/8 PIN ECU     F Ab C8/T1 U ADM/FDI 5 5   HF L1/2/3 Fem Illopsoas 4 3-   KE L2/3/4 Fem Quad 4+ 4   DF L4/5 D Peron Tib Ant 5 4+   PF S1/2 Tibial Grc/Sol 5 4+    Reflexes:  Right Left Comments  Pectoralis      Biceps (C5/6) 1 1   Brachioradialis (C5/6) 2 2    Triceps (C6/7) 1 1    Patellar (L3/4) - - Unable to elicit, chronic lymphedema in BL lower extremities.   Achilles (S1)      Hoffman      Plantar withdraws withdraws   Jaw jerk    Sensation:  Light touch Intact in BL uppers, reports hard to tell due to chronic lymphedema.   Pin prick    Temperature    Vibration   Proprioception    Coordination/Complex Motor:  - Finger to Nose intact BL - Heel to shin unable to do. - Rapid alternating movement are slowed BL - Gait: deferred.  Labs/Imaging/Neurodiagnostic studies   CBC:  Recent Labs  Lab 27-Nov-2024 1858  WBC 5.5  NEUTROABS 3.9  HGB 13.2  HCT 41.6  MCV 91.4  PLT 213   Basic Metabolic Panel:  Lab Results  Component Value Date   NA 138 27-Nov-2024   K 5.5 (H) Nov 27, 2024   CO2 22 11-27-2024   GLUCOSE 161 (H) 11/27/24   BUN 42 (H) Nov 27, 2024   CREATININE 1.14 11/27/24   CALCIUM  10.5 (H) 11-27-24   GFRNONAA >60 Nov 27, 2024   GFRAA >60 08/21/2015   Lipid Panel:  Lab Results  Component Value Date   LDLCALC 96 03/09/2024   HgbA1c:  Lab Results  Component Value Date   HGBA1C 7.9 (H) 03/08/2024   Urine Drug Screen:     Component Value Date/Time   LABOPIA NEGATIVE 27-Nov-2024 2010   COCAINSCRNUR NEGATIVE 27-Nov-2024 2010   LABBENZ NEGATIVE  Nov 27, 2024 2010   AMPHETMU NEGATIVE 27-Nov-2024 2010   THCU NEGATIVE Nov 27, 2024 2010   LABBARB NEGATIVE 11-27-24 2010    Alcohol  Level     Component Value Date/Time   ETH <15 11-27-24 1858   INR  Lab Results  Component Value Date   INR 1.2 11-27-2024   APTT  Lab Results  Component Value Date   APTT 40 (H) 2024/11/27   AED levels: No results found for: PHENYTOIN, ZONISAMIDE, LAMOTRIGINE, LEVETIRACETA  CT Head without contrast(Personally reviewed): CTH was negative for a large hypodensity concerning for a large territory infarct or hyperdensity concerning for an ICH  CT angio Head and Neck with contrast(Personally reviewed): 1. Stable CTA as compared to 10/18/2024. No acute large vessel occlusion or other emergent finding. 2. Bulky calcified plaque about the left carotid bulb/proximal left ICA with severe 80% stenosis by NASCET criteria. 3. Occluded left vertebral artery. 4.  Aortic Atherosclerosis (ICD10-I70.0).  MRI Brain(Personally reviewed): No acute strokes. No significant abnormalities.  ASSESSMENT   Raymond Gordon is a 73 y.o. male with hx of afibb on eliquis , HTN, HLD, DM2, who was unable to get up from toilet due to left leg weakness. Reports this  has possibly been going on for a few months. Has had a couple falls in the past few months. Had foley cath that was just recently taken out due to BPH. Has bsaeline constipation. Denies any saddle anesthesia but reports that sensation in the legs has been off due to lymphedema and unreliable reflexes in BL lower extremities secondary to impaired relaxation and lymphedema.  MRI brain with no acute abnormalities and I suspect that the noted L leg weakness is most likely secondary to a small stroke a few months ago when he initially noted this. However, will get MRI C, T, L spine without contrast to evaluate for any cord abnormalities given difficult exam. Will benefit from PT and OT evaluation since he is weak in the  left leg and is at risk of falls which could be disastrous since he is on eliquis .  RECOMMENDATIONS  - Will get MRI C, T, L spine without contrast to evaluate for any cord abnormalities given difficult exam. Will benefit from PT and OT evaluation since he is weak in the left leg and is at risk of falls which could be disastrous since he is on eliquis . - continue Eliquis  5mg  BID for now. - no further inpatient workup if MRI spine are negative. Will beed outpatient EMG/NCS and follow up with neurology. ______________________________________________________________________  Plan discussed with Dr. Patt. Plan also discussed with patient and with his sister at the bedside.  Signed, Teiara Baria, MD [facility]     [1] No Known Allergies [2] No current facility-administered medications for this encounter.  Current Outpatient Medications:    acetaminophen  (TYLENOL ) 325 MG tablet, Take 2 tablets (650 mg total) by mouth every 6 (six) hours as needed for mild pain (pain score 1-3) or fever (or Fever >/= 101)., Disp: , Rfl:    allopurinol  (ZYLOPRIM ) 300 MG tablet, Take 300 mg by mouth in the morning., Disp: , Rfl:    apixaban  (ELIQUIS ) 5 MG TABS tablet, Take 1 tablet by mouth twice daily (Patient taking differently: Take 5 mg by mouth 2 (two) times daily.), Disp: 180 tablet, Rfl: 1   aspirin  81 MG chewable tablet, Chew 1 tablet (81 mg total) by mouth daily., Disp: 30 tablet, Rfl: 3   atorvastatin  (LIPITOR ) 80 MG tablet, Take 1 tablet by mouth once daily (Patient taking differently: Take 80 mg by mouth in the morning.), Disp: 90 tablet, Rfl: 3   finasteride  (PROSCAR ) 5 MG tablet, Take 5 mg by mouth every evening., Disp: , Rfl:    furosemide  (LASIX ) 80 MG tablet, Take 40 mg by mouth daily., Disp: , Rfl:    hydrALAZINE  (APRESOLINE ) 25 MG tablet, Take 1 tablet (25 mg total) by mouth every 12 (twelve) hours., Disp: 60 tablet, Rfl: 0   insulin  aspart (NOVOLOG ) 100 UNIT/ML injection, Inject  0-20 Units into the skin 3 (three) times daily with meals. For glucose 121 to 150 use 3 units, for 151 to 200 use 4 units, for 201 to 250 use 7 units, for 251 to 300 use 11 units, for 301 to 350 use 15 units, for 351 to 400 use 20 units. (Patient taking differently: Inject 4 Units into the skin 3 (three) times daily with meals. For glucose 121 to 150 use 3 units, for 151 to 200 use 4 units, for 201 to 250 use 7 units, for 251 to 300 use 11 units, for 301 to 350 use 15 units, for 351 to 400 use 20 units.), Disp: 18 mL, Rfl: 0   Lancets (  ONETOUCH DELICA PLUS LANCET33G) MISC, Apply topically as directed., Disp: , Rfl:    LANTUS  SOLOSTAR 100 UNIT/ML Solostar Pen, Inject 10 Units into the skin at bedtime. (Patient taking differently: Inject 15 Units into the skin at bedtime.), Disp: , Rfl:    metoprolol  tartrate (LOPRESSOR ) 25 MG tablet, Take 25 mg by mouth 2 (two) times daily., Disp: , Rfl:    ONETOUCH VERIO test strip, 1 each by Other route as needed for other., Disp: , Rfl:    pantoprazole  (PROTONIX ) 40 MG tablet, Take 1 tablet (40 mg total) by mouth daily., Disp: 30 tablet, Rfl: 0   PRESCRIPTION MEDICATION, Inhale into the lungs at bedtime. Pt is on C Pap machine, Disp: , Rfl:

## 2024-11-12 NOTE — ED Provider Notes (Signed)
 " [place] [facility] Provider Note   CSN: [id number] Arrival date & time: 11/12/24  1843     Patient presents with: Stroke Symptoms   Raymond Gordon is a 73 y.o. male history of stroke on Eliquis , diabetes, hypertension, here presenting with weakness.  Patient just went to the doctor's office this morning and went home around noon.  Another family member called around 4 PM and he did not answer the phone.  Sister came to check on him and he was on the floor in the bathroom.  Patient was noted to have some left-sided weakness and questionable facial droop and slurred speech.  EMS was called and patient was brought to the ER   The history is provided by the patient.       Prior to Admission medications  Medication Sig Start Date End Date Taking? Authorizing Provider  acetaminophen  (TYLENOL ) 325 MG tablet Take 2 tablets (650 mg total) by mouth every 6 (six) hours as needed for mild pain (pain score 1-3) or fever (or Fever >/= 101). 06/07/24   Elgergawy, Brayton RAMAN, MD  allopurinol  (ZYLOPRIM ) 300 MG tablet Take 300 mg by mouth in the morning. 05/02/15   [provider]  apixaban  (ELIQUIS ) 5 MG TABS tablet Take 1 tablet by mouth twice daily Patient taking differently: Take 5 mg by mouth 2 (two) times daily. 09/19/23   Court Dorn PARAS, MD  aspirin  81 MG chewable tablet Chew 1 tablet (81 mg total) by mouth daily. 10/18/24   Arlee Katz, MD  atorvastatin  (LIPITOR ) 80 MG tablet Take 1 tablet by mouth once daily Patient taking differently: Take 80 mg by mouth in the morning. 06/18/23   Court Dorn PARAS, MD  finasteride  (PROSCAR ) 5 MG tablet Take 5 mg by mouth every evening. 04/11/22   [provider]  furosemide  (LASIX ) 80 MG tablet Take 40 mg by mouth daily.    [provider]  hydrALAZINE  (APRESOLINE ) 25 MG tablet Take 1 tablet (25 mg total) by mouth every 12 (twelve) hours. 06/10/24   Ghimire, Donalda HERO, MD  insulin  aspart  (NOVOLOG ) 100 UNIT/ML injection Inject 0-20 Units into the skin 3 (three) times daily with meals. For glucose 121 to 150 use 3 units, for 151 to 200 use 4 units, for 201 to 250 use 7 units, for 251 to 300 use 11 units, for 301 to 350 use 15 units, for 351 to 400 use 20 units. Patient taking differently: Inject 4 Units into the skin 3 (three) times daily with meals. For glucose 121 to 150 use 3 units, for 151 to 200 use 4 units, for 201 to 250 use 7 units, for 251 to 300 use 11 units, for 301 to 350 use 15 units, for 351 to 400 use 20 units. 03/12/24   Arrien, Elidia Sieving, MD  Lancets [facility] DELICA PLUS [place]) MISC Apply topically as directed. 06/29/24   [provider]  LANTUS  SOLOSTAR 100 UNIT/ML Solostar Pen Inject 10 Units into the skin at bedtime. Patient taking differently: Inject 15 Units into the skin at bedtime. 06/10/24   Ghimire, Donalda HERO, MD  metoprolol  tartrate (LOPRESSOR ) 25 MG tablet Take 25 mg by mouth 2 (two) times daily.    [provider]  [facility] VERIO test strip 1 each by Other route as needed for other.    [provider]  pantoprazole  (PROTONIX ) 40 MG tablet Take 1 tablet (40 mg total) by mouth daily. 06/10/24   Ghimire, [employer]  M, MD  PRESCRIPTION MEDICATION Inhale into the lungs at bedtime. Pt is on C Pap machine    [provider]    Allergies: Patient has no known allergies.    Review of Systems  Neurological:  Positive for speech difficulty and weakness.  All other systems reviewed and are negative.   Updated Vital Signs BP (!) 158/53   Pulse (!) 59   Resp 16   SpO2 100%   Physical Exam Vitals and nursing note reviewed.  HENT:     Head: Normocephalic.     Nose: Nose normal.     Mouth/Throat:     Mouth: Mucous membranes are moist.  Eyes:     Extraocular Movements: Extraocular movements intact.     Pupils: Pupils are equal, round, and reactive to light.  Cardiovascular:     Rate and Rhythm: Normal rate.      Pulses: Normal pulses.     Heart sounds: Normal heart sounds.  Pulmonary:     Effort: Pulmonary effort is normal.     Breath sounds: Normal breath sounds.  Abdominal:     General: Abdomen is flat.     Palpations: Abdomen is soft.  Musculoskeletal:        General: Normal range of motion.     Cervical back: Normal range of motion.  Skin:    General: Skin is warm.  Neurological:     Mental Status: He is alert.     Comments: ? R facial droop, no obvious slurred speech. Strength 4/5 bilateral arms and legs   Psychiatric:        Mood and Affect: Mood normal.     (all labs ordered are listed, but only abnormal results are displayed) Labs Reviewed  PROTIME-INR - Abnormal; Notable for the following components:      Result Value   Prothrombin Time 15.8 (*)    All other components within normal limits  APTT - Abnormal; Notable for the following components:   aPTT 40 (*)    All other components within normal limits  CBC - Abnormal; Notable for the following components:   RDW 15.9 (*)    All other components within normal limits  DIFFERENTIAL  COMPREHENSIVE METABOLIC PANEL WITH GFR  ETHANOL  URINE DRUG SCREEN  URINALYSIS, W/ REFLEX TO CULTURE (INFECTION SUSPECTED)  CBG MONITORING, ED    EKG: EKG Interpretation Date/Time:  Friday November 12 2024 18:52:11 EST Ventricular Rate:  56 PR Interval:  144 QRS Duration:  136 QT Interval:  426 QTC Calculation: 412 R Axis:   47  Text Interpretation: Sinus rhythm Nonspecific intraventricular conduction delay Minimal ST elevation, anterior leads No significant change since last tracing Confirmed by Patt Alm DEL [phone]) on 11/12/2024 7:07:43 PM  Radiology: MR BRAIN WO CONTRAST Result Date: 11/12/2024 EXAM: MRI BRAIN WITHOUT CONTRAST 11/12/2024 07:39:00 PM TECHNIQUE: Multiplanar multisequence MRI of the head/brain was performed without the administration of intravenous contrast. COMPARISON: MR head without contrast 10/18/2024. CLINICAL  HISTORY: Neuro deficit, acute, stroke suspected. Slurred speech and right-sided facial droop. FINDINGS: BRAIN AND VENTRICLES: No acute infarct. No intracranial hemorrhage. No mass. No midline shift. No hydrocephalus. Confluent periventricular and scattered subcortical T2 hyperintensities are moderately advanced for age, stable from the prior study. Were noted ischemic changes are again noted in the thalami bilaterally. Remote lacunar infarcts are present in the right superior cerebellum. The sella is unremarkable. Normal flow voids. ORBITS: Bilateral lens replacements are noted. The globes and orbits are otherwise within normal limits. SINUSES  AND MASTOIDS: No acute abnormality. BONES AND SOFT TISSUES: Normal marrow signal. No acute soft tissue abnormality. IMPRESSION: 1. No acute intracranial abnormality. 2. Moderately advanced confluent periventricular and scattered subcortical T2 hyperintensities, stable from the prior study. This most likely reflects the sequelae of chronic microvascular ischemia. 3. Chronic ischemic changes in the thalami bilaterally. 4. Remote lacunar infarcts in the right superior cerebellum. Electronically signed by: Lonni Necessary MD 11/12/2024 07:46 PM EST RP Workstation: [id number]     Procedures   Medications Ordered in the ED - No data to display                                  Medical Decision Making AKING KLABUNDE is a 73 y.o. male here with slurred speech, right facial droop.  Patient has no obvious slurred speech or facial droop on my exam.  Patient had carotid stenosis on recent CTA.  Concern for stroke versus TIA.  Patient is already on Eliquis .  I discussed with Dr. Elisabeth who states that since patient's symptoms have improved, he does not recommend activity code stroke.  Will get CTA head and neck and MRI brain  10:10 PM I reviewed patient's labs and they were unremarkable.  MRI brain did not show a new stroke.  Neurology felt he could be TIA.  Neurology  also felt it may be a spine problem and recommend thoracic and lumbar MRI.  He also recommend PT and OT eval.  Neurology felt the carotid stenosis can be monitored and does not need to be intervened on during this admission  Problems Addressed: TIA (transient ischemic attack): acute illness or injury Weakness of left lower extremity: acute illness or injury  Amount and/or Complexity of Data Reviewed Labs: ordered. Decision-making details documented in ED Course. Radiology: ordered and independent interpretation performed. Decision-making details documented in ED Course.  Risk Prescription drug management.     Final diagnoses:  None    ED Discharge Orders     None          Patt Alm Macho, MD 11/12/24 2212  "

## 2024-11-12 NOTE — H&P (Incomplete)
 " History and Physical    Raymond Gordon FMW:[id number] DOB: [birth year] DOA: 11/12/2024  PCP: [name], MD  Patient coming from: ***  I have personally briefly reviewed patient's old medical records in [facility]  Chief Complaint: ***  HPI: Raymond Gordon is a 73 y.o. male with medical history significant of    ED Course: ***  Review of Systems: As per HPI otherwise 10 point review of systems negative.   Past Medical History:  Diagnosis Date   Atrial fibrillation with RVR (HCC) 07/08/2015   Bilateral lower extremity edema    Chronic diastolic CHF (congestive heart failure) (HCC) 08/15/2015   a. 06/2015: echo showing EF of 65-70% with Grade 2 DD   Diabetes (HCC)    INSULIN  DEPENDENT   Dysrhythmia    Hyperglycemia due to type 2 diabetes mellitus (HCC) 04/11/2021   Hyperlipidemia    Hypertension    Hypoglycemia 05/20/2014   Obesity    Obesity    Paroxysmal atrial fibrillation (HCC)    Sleep apnea     Past Surgical History:  Procedure Laterality Date   CARDIAC CATHETERIZATION     CARDIAC CATHETERIZATION N/A 2012   no large vessel occlusions   IR ANGIOGRAM SELECTIVE EACH ADDITIONAL VESSEL  08/05/2024   IR ANGIOGRAM SELECTIVE EACH ADDITIONAL VESSEL  08/05/2024   IR ANGIOGRAM VISCERAL SELECTIVE  08/05/2024   IR ANGIOGRAM VISCERAL SELECTIVE  08/05/2024   IR EMBO ARTERIAL NOT HEMORR HEMANG INC GUIDE ROADMAPPING  08/05/2024   IR RADIOLOGIST EVAL & MGMT  05/03/2024   IR RADIOLOGIST EVAL & MGMT  09/06/2024   IR US  GUIDE VASC ACCESS LEFT  08/05/2024     reports that he quit smoking about 26 years ago. His smoking use included cigarettes. He quit smokeless tobacco use about 9 years ago. He reports that he does not drink alcohol  and does not use drugs.  Allergies[1]  Family History  Problem Relation Age of Onset   Heart failure Brother    Hyperlipidemia Brother    Hypertension Brother    Stroke Brother    Hypertension Mother     Hyperlipidemia Mother    Diabetes Mother    Cancer Father        prostate   GER disease Father    Stroke Father    Hypertension Sister    Hyperlipidemia Sister    Hypertension Maternal Grandmother    Stroke Maternal Grandmother    Diabetes Maternal Grandfather    Hypertension Maternal Grandfather    *** Prior to Admission medications  Medication Sig Start Date End Date Taking? Authorizing Provider  acetaminophen  (TYLENOL ) 325 MG tablet Take 2 tablets (650 mg total) by mouth every 6 (six) hours as needed for mild pain (pain score 1-3) or fever (or Fever >/= 101). 06/07/24   Elgergawy, Brayton RAMAN, MD  allopurinol  (ZYLOPRIM ) 300 MG tablet Take 300 mg by mouth in the morning. 05/02/15   [provider]  apixaban  (ELIQUIS ) 5 MG TABS tablet Take 1 tablet by mouth twice daily Patient taking differently: Take 5 mg by mouth 2 (two) times daily. 09/19/23   Court Dorn PARAS, MD  aspirin  81 MG chewable tablet Chew 1 tablet (81 mg total) by mouth daily. 10/18/24   Arlee Katz, MD  atorvastatin  (LIPITOR ) 80 MG tablet Take 1 tablet by mouth once daily Patient taking differently: Take 80 mg by mouth in the morning. 06/18/23   Court Dorn PARAS, MD  finasteride  (PROSCAR ) 5 MG tablet Take 5  mg by mouth every evening. 04/11/22   [provider]  furosemide  (LASIX ) 80 MG tablet Take 40 mg by mouth daily.    [provider]  hydrALAZINE  (APRESOLINE ) 25 MG tablet Take 1 tablet (25 mg total) by mouth every 12 (twelve) hours. 06/10/24   Ghimire, Donalda HERO, MD  insulin  aspart (NOVOLOG ) 100 UNIT/ML injection Inject 0-20 Units into the skin 3 (three) times daily with meals. For glucose 121 to 150 use 3 units, for 151 to 200 use 4 units, for 201 to 250 use 7 units, for 251 to 300 use 11 units, for 301 to 350 use 15 units, for 351 to 400 use 20 units. Patient taking differently: Inject 4 Units into the skin 3 (three) times daily with meals. For glucose 121 to 150 use 3 units, for  151 to 200 use 4 units, for 201 to 250 use 7 units, for 251 to 300 use 11 units, for 301 to 350 use 15 units, for 351 to 400 use 20 units. 03/12/24   Arrien, Elidia Sieving, MD  Lancets [facility] DELICA PLUS [place]) MISC Apply topically as directed. 06/29/24   [provider]  LANTUS  SOLOSTAR 100 UNIT/ML Solostar Pen Inject 10 Units into the skin at bedtime. Patient taking differently: Inject 15 Units into the skin at bedtime. 06/10/24   Ghimire, Donalda HERO, MD  metoprolol  tartrate (LOPRESSOR ) 25 MG tablet Take 25 mg by mouth 2 (two) times daily.    [provider]  [facility] VERIO test strip 1 each by Other route as needed for other.    [provider]  pantoprazole  (PROTONIX ) 40 MG tablet Take 1 tablet (40 mg total) by mouth daily. 06/10/24   Ghimire, Donalda HERO, MD  PRESCRIPTION MEDICATION Inhale into the lungs at bedtime. Pt is on C Pap machine    [provider]    Physical Exam: Vitals:   11/12/24 1855 11/12/24 1900 11/12/24 2030 11/12/24 2100  BP: (!) 158/53 (!) 142/51 (!) 148/108 (!) 144/49  Pulse: (!) 59 (!) 57 (!) 56 (!) 48  Resp: 16 (!) 27 (!) 7 16  SpO2: 100% 92% 100% 100%    Constitutional: NAD, calm, comfortable Vitals:   11/12/24 1855 11/12/24 1900 11/12/24 2030 11/12/24 2100  BP: (!) 158/53 (!) 142/51 (!) 148/108 (!) 144/49  Pulse: (!) 59 (!) 57 (!) 56 (!) 48  Resp: 16 (!) 27 (!) 7 16  SpO2: 100% 92% 100% 100%   Eyes: PERRL, lids and conjunctivae normal ENMT: Mucous membranes are moist. Posterior pharynx clear of any exudate or lesions.Normal dentition.  Neck: normal, supple, no masses, no thyromegaly Respiratory: clear to auscultation bilaterally, no wheezing, no crackles. Normal respiratory effort. No accessory muscle use.  Cardiovascular: Regular rate and rhythm, no murmurs / rubs / gallops. No extremity edema. 2+ pedal pulses. No carotid bruits.  Abdomen: no tenderness, no masses palpated. No hepatosplenomegaly. Bowel sounds  positive.  Musculoskeletal: no clubbing / cyanosis. No joint deformity upper and lower extremities. Good ROM, no contractures. Normal muscle tone.  Skin: no rashes, lesions, ulcers. No induration Neurologic: CN 2-12 grossly intact. Sensation intact, DTR normal. Strength 5/5 in all 4.  Psychiatric: Normal judgment and insight. Alert and oriented x 3. Normal mood.    Labs on Admission: I have personally reviewed following labs and imaging studies  CBC: Recent Labs  Lab 11/12/24 1858  WBC 5.5  NEUTROABS 3.9  HGB 13.2  HCT 41.6  MCV 91.4  PLT 213   Basic Metabolic Panel:  Recent Labs  Lab 11/12/24 1858  NA 138  K 5.5*  CL 103  CO2 22  GLUCOSE 161*  BUN 42*  CREATININE 1.14  CALCIUM  10.5*   GFR: CrCl cannot be calculated (Unknown ideal weight.). Liver Function Tests: Recent Labs  Lab 11/12/24 1858  AST 51*  ALT 42  ALKPHOS 103  BILITOT 0.3  PROT 8.0  ALBUMIN 3.7   No results for input(s): LIPASE, AMYLASE in the last 168 hours. No results for input(s): AMMONIA in the last 168 hours. Coagulation Profile: Recent Labs  Lab 11/12/24 1858  INR 1.2   Cardiac Enzymes: Recent Labs  Lab 11/12/24 1858  CKTOTAL 92   BNP (last 3 results) No results for input(s): PROBNP in the last 8760 hours. HbA1C: No results for input(s): HGBA1C in the last 72 hours. CBG: No results for input(s): GLUCAP in the last 168 hours. Lipid Profile: No results for input(s): CHOL, HDL, LDLCALC, TRIG, CHOLHDL, LDLDIRECT in the last 72 hours. Thyroid Function Tests: No results for input(s): TSH, T4TOTAL, FREET4, T3FREE, THYROIDAB in the last 72 hours. Anemia Panel: No results for input(s): VITAMINB12, FOLATE, FERRITIN, TIBC, IRON, RETICCTPCT in the last 72 hours. Urine analysis:    Component Value Date/Time   COLORURINE YELLOW 11/12/2024 2018   APPEARANCEUR CLEAR 11/12/2024 2018   LABSPEC 1.010 11/12/2024 2018   PHURINE 6.0 11/12/2024 2018    GLUCOSEU NEGATIVE 11/12/2024 2018   HGBUR SMALL (A) 11/12/2024 2018   BILIRUBINUR NEGATIVE 11/12/2024 2018   KETONESUR NEGATIVE 11/12/2024 2018   PROTEINUR NEGATIVE 11/12/2024 2018   UROBILINOGEN 1.0 08/18/2015 0610   NITRITE NEGATIVE 11/12/2024 2018   LEUKOCYTESUR NEGATIVE 11/12/2024 2018    Radiological Exams on Admission: CT ANGIO HEAD NECK W WO CM Result Date: 11/12/2024 CLINICAL DATA:  Initial evaluation for acute neuro deficit, stroke suspected. EXAM: CT ANGIOGRAPHY HEAD AND NECK WITH AND WITHOUT CONTRAST TECHNIQUE: Multidetector CT imaging of the head and neck was performed using the standard protocol during bolus administration of intravenous contrast. Multiplanar CT image reconstructions and MIPs were obtained to evaluate the vascular anatomy. Carotid stenosis measurements (when applicable) are obtained utilizing NASCET criteria, using the distal internal carotid diameter as the denominator. RADIATION DOSE REDUCTION: This exam was performed according to the departmental dose-optimization program which includes automated exposure control, adjustment of the mA and/or kV according to patient size and/or use of iterative reconstruction technique. CONTRAST:  75mL OMNIPAQUE  IOHEXOL  350 MG/ML SOLN COMPARISON:  Prior MRI from earlier the same day and prior CT from 10/18/2024. FINDINGS: CTA NECK FINDINGS Aortic arch: Visualized aortic arch within normal limits for caliber with standard 3 vessel morphology. Mild aortic atherosclerosis. No significant stenosis about the origin the great vessels. Right carotid system: Right common and internal carotid arteries are patent without dissection. Atheromatous change about the right carotid bulb without hemodynamically significant greater than 50% stenosis. Left carotid system: Left common and internal carotid arteries are patent without dissection. Bulky calcified plaque about the left carotid bulb/proximal left ICA with severe 80% stenosis by NASCET  criteria. 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 Atheromatous change at the origin of the right vertebral artery but without hemodynamically significant stenosis. Right vertebral artery patent distally without stenosis or dissection. Skeleton: No worrisome osseous lesions.  Patient is edentulous. Other neck: No other acute finding. Upper chest: No other acute finding. Review of the MIP images confirms the above findings CTA HEAD FINDINGS Anterior circulation: Atheromatous change about the carotid  siphons but without hemodynamically significant stenosis. A1 segments patent bilaterally. Normal anterior communicating artery complex. Anterior cerebral arteries patent without stenosis. No M1 stenosis or occlusion. Left M1 bifurcates early. No proximal MCA branch occlusion or high-grade stenosis. Distal MCA branches perfused and symmetric. Posterior circulation: Left vertebral artery remains occluded to the vertebrobasilar junction. Atheromatous change at the proximal right V4 segment without significant stenosis. Neither PICA visualized. Basilar diminutive but patent without stenosis. Superior cerebral arteries patent bilaterally. Fetal type origin of the PCAs bilaterally both of which remain widely patent to their distal aspects. Venous sinuses: Not well assessed due to timing the contrast bolus. Anatomic variants: Fetal type origin of the PCAs with overall diminutive vertebrobasilar system. No aneurysm. Review of the MIP images confirms the above findings IMPRESSION: 1. Stable CTA as compared to 10/18/2024. No acute large vessel occlusion or other emergent finding. 2. Bulky calcified plaque about the left carotid bulb/proximal left ICA with severe 80% stenosis by NASCET criteria. 3. Occluded left vertebral artery. 4.  Aortic Atherosclerosis (ICD10-I70.0). Electronically Signed   By: Morene Hoard M.D.   On: 11/12/2024 22:11   DG Pelvis  Portable Result Date: 11/12/2024 CLINICAL DATA:  Recent fall with pelvic pain, initial encounter EXAM: PORTABLE PELVIS 1-2 VIEWS COMPARISON:  None Available. FINDINGS: Pelvic ring is intact. Degenerative changes of the hip joints are noted left greater than right. Subchondral cyst formation is noted in the left femoral head. No soft tissue abnormality is seen. IMPRESSION: Degenerative changes without acute abnormality. Electronically Signed   By: Oneil Devonshire M.D.   On: 11/12/2024 20:46   DG Chest Port 1 View Result Date: 11/12/2024 CLINICAL DATA:  Weakness EXAM: PORTABLE CHEST 1 VIEW COMPARISON:  05/31/2024 FINDINGS: The heart size and mediastinal contours are within normal limits. Both lungs are clear. The visualized skeletal structures are unremarkable. IMPRESSION: No active disease. Electronically Signed   By: Oneil Devonshire M.D.   On: 11/12/2024 20:46   MR BRAIN WO CONTRAST Result Date: 11/12/2024 EXAM: MRI BRAIN WITHOUT CONTRAST 11/12/2024 07:39:00 PM TECHNIQUE: Multiplanar multisequence MRI of the head/brain was performed without the administration of intravenous contrast. COMPARISON: MR head without contrast 10/18/2024. CLINICAL HISTORY: Neuro deficit, acute, stroke suspected. Slurred speech and right-sided facial droop. FINDINGS: BRAIN AND VENTRICLES: No acute infarct. No intracranial hemorrhage. No mass. No midline shift. No hydrocephalus. Confluent periventricular and scattered subcortical T2 hyperintensities are moderately advanced for age, stable from the prior study. Were noted ischemic changes are again noted in the thalami bilaterally. Remote lacunar infarcts are present in the right superior cerebellum. The sella is unremarkable. Normal flow voids. ORBITS: Bilateral lens replacements are noted. The globes and orbits are otherwise within normal limits. SINUSES AND MASTOIDS: No acute abnormality. BONES AND SOFT TISSUES: Normal marrow signal. No acute soft tissue abnormality. IMPRESSION: 1. No  acute intracranial abnormality. 2. Moderately advanced confluent periventricular and scattered subcortical T2 hyperintensities, stable from the prior study. This most likely reflects the sequelae of chronic microvascular ischemia. 3. Chronic ischemic changes in the thalami bilaterally. 4. Remote lacunar infarcts in the right superior cerebellum. Electronically signed by: Lonni Necessary MD 11/12/2024 07:46 PM EST RP Workstation: [id number]    EKG: Independently reviewed. ***  Assessment/Plan Principal Problem:   TIA (transient ischemic attack)   ***  DVT prophylaxis: *** (Lovenox /Heparin /SCD's/anticoagulated/None (if comfort care) Code Status: *** (Full/Partial (specify details) Family Communication: *** (Specify name, relationship. Do not write discussed with patient. Specify tel # if discussed over the phone) Disposition Plan: *** (specify when  and where you expect patient to be discharged) Consults called: *** (with names) Admission status: *** (inpatient / obs / tele / medical floor / SDU)   Camila DELENA Ned MD [facility] Pager [phone]  If 7PM-7AM, please contact night-coverage [email or web address] Password [email or web address]  11/12/2024, 11:28 PM          [1] No Known Allergies "

## 2024-11-12 NOTE — H&P (Addendum)
 " History and Physical    Raymond Gordon FMW:[id number] DOB: 08-16-1951 DOA: 11/12/2024  PCP: [name], MD  Patient coming from: home   I have personally briefly reviewed patient's old medical records in [facility]  Chief Complaint: slurred speech   HPI: Raymond Gordon is a 73 y.o. male with medical history significant of DMII, Gout,  P Afib, HTN, OSA,Gout ,CKDIIIa, Anemia of chronic disease, Dementia nos, BPH chronic indwelling foley , HLD , Hfpef.  Patient presents to ED BIBEMS after episode of slurred speech  and right sided facial droop with lkw of 1200 day of presentation. Patient was seen by pcp and left office around noon. Patient at 4 pm was on the phone with family member and was noted to have slurred speech.  Family member when to check on patient and patient was found on the comode. Patient notes he has too weak to get himself of the floor. Per family member he was also noted to have slurred speech. AT that time EMS was called. Patient currently states he was weak and was unable to get off the commode. He notes no change in his speech, or new weakness currently. He denies ,fever/ chills n/v/d or abdominal pain  ED Course:   IN ED  Vitals: avss, bp 158/53, rr 16, sat 100%  EKG: nsr  ON arrival to ED all symptoms resolved no noted slurred speech of facial droop noted. Patient evaluated by neurology who recomended admission for tia evaluation as well as further evaluation with mri of spine.  Of note CTH and MRI head noted no acute cva.   Labs: Wbc 5.5, hgb 13.2 ,plt 213 EtOH < 15,   Na 138, K 5.5  glu 161, cr 1.14, cal 10.5  UDS: neg  UA: neg  Cxr NAD  Pelvis:  IMPRESSION: Degenerative changes without acute abnormality.  MRI IMPRESSION: 1. No acute intracranial abnormality. 2. Moderately advanced confluent periventricular and scattered subcortical T2 hyperintensities, stable from the prior study. This most likely reflects the sequelae of chronic  microvascular ischemia. 3. Chronic ischemic changes in the thalami bilaterally. 4. Remote lacunar infarcts in the right superior cerebellum.  MR Cervical spine :  MPRESSION: 1. Motion degraded exam. 2. Normal MRI appearance of the cervical spinal cord. No cord signal changes to suggest myelopathy. 3. Multilevel cervical spondylosis with resultant mild to moderate diffuse spinal stenosis at C2-3 through C6-7, most pronounced at C3-4 and C4-5. 4. Multifactorial degenerative changes with resultant multilevel foraminal narrowing as above. Notable findings include moderate left C3 foraminal stenosis, moderate to severe left with moderate right C4 foraminal narrowing, with moderate to severe right worse than left C5 foraminal stenosis. 5. Loss of normal flow void within the left vertebral artery, consistent with occlusion as seen on prior CTA.   CTA head and neck IMPRESSION: 1. Stable CTA as compared to 10/18/2024. No acute large vessel occlusion or other emergent finding. 2. Bulky calcified plaque about the left carotid bulb/proximal left ICA with severe 80% stenosis by NASCET criteria. 3. Occluded left vertebral artery. 4.  Aortic Atherosclerosis (ICD10-I70.0). MRI thoracic spine MPRESSION: Normal MRI of the thoracic spine and spinal cord. No significant disc pathology, stenosis, or evidence for neural impingement.   IMPRESSION: 1. No acute abnormality within the lumbar spine. 2. Disc bulge with facet hypertrophy at L4-5 with resultant moderate left worse than right lateral recess stenosis, with moderate left worse than right L4 foraminal narrowing. 3. Small right foraminal to extraforaminal disc protrusion  at L3-4, potentially affecting the exiting right L3 nerve root. 4. Moderate bilateral facet hypertrophy at L3-4 through L5-S1, which could contribute to lower back pain.   Review of Systems: As per HPI otherwise 10 point review of systems negative.   Past Medical  History:  Diagnosis Date   Atrial fibrillation with RVR (HCC) 07/08/2015   Bilateral lower extremity edema    Chronic diastolic CHF (congestive heart failure) (HCC) 08/15/2015   a. 06/2015: echo showing EF of 65-70% with Grade 2 DD   Diabetes (HCC)    INSULIN  DEPENDENT   Dysrhythmia    Hyperglycemia due to type 2 diabetes mellitus (HCC) 04/11/2021   Hyperlipidemia    Hypertension    Hypoglycemia 05/20/2014   Obesity    Obesity    Paroxysmal atrial fibrillation (HCC)    Sleep apnea     Past Surgical History:  Procedure Laterality Date   CARDIAC CATHETERIZATION     CARDIAC CATHETERIZATION N/A 2012   no large vessel occlusions   IR ANGIOGRAM SELECTIVE EACH ADDITIONAL VESSEL  08/05/2024   IR ANGIOGRAM SELECTIVE EACH ADDITIONAL VESSEL  08/05/2024   IR ANGIOGRAM VISCERAL SELECTIVE  08/05/2024   IR ANGIOGRAM VISCERAL SELECTIVE  08/05/2024   IR EMBO ARTERIAL NOT HEMORR HEMANG INC GUIDE ROADMAPPING  08/05/2024   IR RADIOLOGIST EVAL & MGMT  05/03/2024   IR RADIOLOGIST EVAL & MGMT  09/06/2024   IR US  GUIDE VASC ACCESS LEFT  08/05/2024     reports that he quit smoking about 26 years ago. His smoking use included cigarettes. He quit smokeless tobacco use about 9 years ago. He reports that he does not drink alcohol  and does not use drugs.  Allergies[1]  Family History  Problem Relation Age of Onset   Heart failure Brother    Hyperlipidemia Brother    Hypertension Brother    Stroke Brother    Hypertension Mother    Hyperlipidemia Mother    Diabetes Mother    Cancer Father        prostate   GER disease Father    Stroke Father    Hypertension Sister    Hyperlipidemia Sister    Hypertension Maternal Grandmother    Stroke Maternal Grandmother    Diabetes Maternal Grandfather    Hypertension Maternal Grandfather     Prior to Admission medications  Medication Sig Start Date End Date Taking? Authorizing Provider  acetaminophen  (TYLENOL ) 325 MG tablet Take 2 tablets (650 mg total) by  mouth every 6 (six) hours as needed for mild pain (pain score 1-3) or fever (or Fever >/= 101). 06/07/24   Elgergawy, Brayton RAMAN, MD  allopurinol  (ZYLOPRIM ) 300 MG tablet Take 300 mg by mouth in the morning. 05/02/15   [provider]  apixaban  (ELIQUIS ) 5 MG TABS tablet Take 1 tablet by mouth twice daily Patient taking differently: Take 5 mg by mouth 2 (two) times daily. 09/19/23   Court Dorn PARAS, MD  aspirin  81 MG chewable tablet Chew 1 tablet (81 mg total) by mouth daily. 10/18/24   Arlee Katz, MD  atorvastatin  (LIPITOR ) 80 MG tablet Take 1 tablet by mouth once daily Patient taking differently: Take 80 mg by mouth in the morning. 06/18/23   Court Dorn PARAS, MD  finasteride  (PROSCAR ) 5 MG tablet Take 5 mg by mouth every evening. 04/11/22   [provider]  furosemide  (LASIX ) 80 MG tablet Take 40 mg by mouth daily.    [provider]  hydrALAZINE  (APRESOLINE ) 25 MG tablet Take 1  tablet (25 mg total) by mouth every 12 (twelve) hours. 06/10/24   Ghimire, Donalda HERO, MD  insulin  aspart (NOVOLOG ) 100 UNIT/ML injection Inject 0-20 Units into the skin 3 (three) times daily with meals. For glucose 121 to 150 use 3 units, for 151 to 200 use 4 units, for 201 to 250 use 7 units, for 251 to 300 use 11 units, for 301 to 350 use 15 units, for 351 to 400 use 20 units. Patient taking differently: Inject 4 Units into the skin 3 (three) times daily with meals. For glucose 121 to 150 use 3 units, for 151 to 200 use 4 units, for 201 to 250 use 7 units, for 251 to 300 use 11 units, for 301 to 350 use 15 units, for 351 to 400 use 20 units. 03/12/24   Arrien, Elidia Sieving, MD  Lancets [facility] DELICA PLUS [place]) MISC Apply topically as directed. 06/29/24   [provider]  LANTUS  SOLOSTAR 100 UNIT/ML Solostar Pen Inject 10 Units into the skin at bedtime. Patient taking differently: Inject 15 Units into the skin at bedtime. 06/10/24   Ghimire, Donalda HERO, MD  metoprolol  tartrate  (LOPRESSOR ) 25 MG tablet Take 25 mg by mouth 2 (two) times daily.    [provider]  [facility] VERIO test strip 1 each by Other route as needed for other.    [provider]  pantoprazole  (PROTONIX ) 40 MG tablet Take 1 tablet (40 mg total) by mouth daily. 06/10/24   Ghimire, Donalda HERO, MD  PRESCRIPTION MEDICATION Inhale into the lungs at bedtime. Pt is on C Pap machine    [provider]    Physical Exam: Vitals:   11/12/24 1855 11/12/24 1900 11/12/24 2030 11/12/24 2100  BP: (!) 158/53 (!) 142/51 (!) 148/108 (!) 144/49  Pulse: (!) 59 (!) 57 (!) 56 (!) 48  Resp: 16 (!) 27 (!) 7 16  SpO2: 100% 92% 100% 100%    Constitutional: NAD, calm, comfortable Vitals:   11/12/24 1855 11/12/24 1900 11/12/24 2030 11/12/24 2100  BP: (!) 158/53 (!) 142/51 (!) 148/108 (!) 144/49  Pulse: (!) 59 (!) 57 (!) 56 (!) 48  Resp: 16 (!) 27 (!) 7 16  SpO2: 100% 92% 100% 100%   Eyes: PERRL, lids and conjunctivae normal ENMT: Mucous membranes are moist. Posterior pharynx clear of any exudate or lesions.Normal dentition.  Neck: normal, supple, no masses, no thyromegaly Respiratory: clear to auscultation bilaterally, no wheezing, no crackles. Normal respiratory effort. No accessory muscle use.  Cardiovascular: Regular rate and rhythm, no murmurs / rubs / gallops. No extremity edema. 2+ pedal pulses Abdomen: no tenderness, no masses palpated. No hepatosplenomegaly. Bowel sounds positive.  Musculoskeletal: no clubbing / cyanosis. No joint deformity upper and lower extremities. Good ROM, no contractures. Normal muscle tone.  Skin: no rashes, lesions, ulcers. No induration Neurologic: CN 2-12 grossly intact. Sensation intact, Strength 5/5 in all 4.  Except left lower ext 3+--4/5 Psychiatric: Normal judgment and insight. Alert and oriented x 3. Normal mood.    Labs on Admission: I have personally reviewed following labs and imaging studies  CBC: Recent Labs  Lab 11/12/24 1858  WBC 5.5   NEUTROABS 3.9  HGB 13.2  HCT 41.6  MCV 91.4  PLT 213   Basic Metabolic Panel: Recent Labs  Lab 11/12/24 1858  NA 138  K 5.5*  CL 103  CO2 22  GLUCOSE 161*  BUN 42*  CREATININE 1.14  CALCIUM  10.5*   GFR: CrCl cannot be calculated (Unknown  ideal weight.). Liver Function Tests: Recent Labs  Lab 11/12/24 1858  AST 51*  ALT 42  ALKPHOS 103  BILITOT 0.3  PROT 8.0  ALBUMIN 3.7   No results for input(s): LIPASE, AMYLASE in the last 168 hours. No results for input(s): AMMONIA in the last 168 hours. Coagulation Profile: Recent Labs  Lab 11/12/24 1858  INR 1.2   Cardiac Enzymes: Recent Labs  Lab 11/12/24 1858  CKTOTAL 92   BNP (last 3 results) No results for input(s): PROBNP in the last 8760 hours. HbA1C: No results for input(s): HGBA1C in the last 72 hours. CBG: No results for input(s): GLUCAP in the last 168 hours. Lipid Profile: No results for input(s): CHOL, HDL, LDLCALC, TRIG, CHOLHDL, LDLDIRECT in the last 72 hours. Thyroid Function Tests: No results for input(s): TSH, T4TOTAL, FREET4, T3FREE, THYROIDAB in the last 72 hours. Anemia Panel: No results for input(s): VITAMINB12, FOLATE, FERRITIN, TIBC, IRON, RETICCTPCT in the last 72 hours. Urine analysis:    Component Value Date/Time   COLORURINE YELLOW 11/12/2024 2018   APPEARANCEUR CLEAR 11/12/2024 2018   LABSPEC 1.010 11/12/2024 2018   PHURINE 6.0 11/12/2024 2018   GLUCOSEU NEGATIVE 11/12/2024 2018   HGBUR SMALL (A) 11/12/2024 2018   BILIRUBINUR NEGATIVE 11/12/2024 2018   KETONESUR NEGATIVE 11/12/2024 2018   PROTEINUR NEGATIVE 11/12/2024 2018   UROBILINOGEN 1.0 08/18/2015 0610   NITRITE NEGATIVE 11/12/2024 2018   LEUKOCYTESUR NEGATIVE 11/12/2024 2018    Radiological Exams on Admission: CT ANGIO HEAD NECK W WO CM Result Date: 11/12/2024 CLINICAL DATA:  Initial evaluation for acute neuro deficit, stroke suspected. EXAM: CT ANGIOGRAPHY HEAD AND NECK  WITH AND WITHOUT CONTRAST TECHNIQUE: Multidetector CT imaging of the head and neck was performed using the standard protocol during bolus administration of intravenous contrast. Multiplanar CT image reconstructions and MIPs were obtained to evaluate the vascular anatomy. Carotid stenosis measurements (when applicable) are obtained utilizing NASCET criteria, using the distal internal carotid diameter as the denominator. RADIATION DOSE REDUCTION: This exam was performed according to the departmental dose-optimization program which includes automated exposure control, adjustment of the mA and/or kV according to patient size and/or use of iterative reconstruction technique. CONTRAST:  75mL OMNIPAQUE  IOHEXOL  350 MG/ML SOLN COMPARISON:  Prior MRI from earlier the same day and prior CT from 10/18/2024. FINDINGS: CTA NECK FINDINGS Aortic arch: Visualized aortic arch within normal limits for caliber with standard 3 vessel morphology. Mild aortic atherosclerosis. No significant stenosis about the origin the great vessels. Right carotid system: Right common and internal carotid arteries are patent without dissection. Atheromatous change about the right carotid bulb without hemodynamically significant greater than 50% stenosis. Left carotid system: Left common and internal carotid arteries are patent without dissection. Bulky calcified plaque about the left carotid bulb/proximal left ICA with severe 80% stenosis by NASCET criteria. 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 Atheromatous change at the origin of the right vertebral artery but without hemodynamically significant stenosis. Right vertebral artery patent distally without stenosis or dissection. Skeleton: No worrisome osseous lesions.  Patient is edentulous. Other neck: No other acute finding. Upper chest: No other acute finding. Review of the MIP images confirms the above findings CTA HEAD FINDINGS Anterior  circulation: Atheromatous change about the carotid siphons but without hemodynamically significant stenosis. A1 segments patent bilaterally. Normal anterior communicating artery complex. Anterior cerebral arteries patent without stenosis. No M1 stenosis or occlusion. Left M1 bifurcates early. No proximal MCA branch occlusion or high-grade stenosis.  Distal MCA branches perfused and symmetric. Posterior circulation: Left vertebral artery remains occluded to the vertebrobasilar junction. Atheromatous change at the proximal right V4 segment without significant stenosis. Neither PICA visualized. Basilar diminutive but patent without stenosis. Superior cerebral arteries patent bilaterally. Fetal type origin of the PCAs bilaterally both of which remain widely patent to their distal aspects. Venous sinuses: Not well assessed due to timing the contrast bolus. Anatomic variants: Fetal type origin of the PCAs with overall diminutive vertebrobasilar system. No aneurysm. Review of the MIP images confirms the above findings IMPRESSION: 1. Stable CTA as compared to 10/18/2024. No acute large vessel occlusion or other emergent finding. 2. Bulky calcified plaque about the left carotid bulb/proximal left ICA with severe 80% stenosis by NASCET criteria. 3. Occluded left vertebral artery. 4.  Aortic Atherosclerosis (ICD10-I70.0). Electronically Signed   By: Morene Hoard M.D.   On: 11/12/2024 22:11   DG Pelvis Portable Result Date: 11/12/2024 CLINICAL DATA:  Recent fall with pelvic pain, initial encounter EXAM: PORTABLE PELVIS 1-2 VIEWS COMPARISON:  None Available. FINDINGS: Pelvic ring is intact. Degenerative changes of the hip joints are noted left greater than right. Subchondral cyst formation is noted in the left femoral head. No soft tissue abnormality is seen. IMPRESSION: Degenerative changes without acute abnormality. Electronically Signed   By: Oneil Devonshire M.D.   On: 11/12/2024 20:46   DG Chest Port 1 View Result  Date: 11/12/2024 CLINICAL DATA:  Weakness EXAM: PORTABLE CHEST 1 VIEW COMPARISON:  05/31/2024 FINDINGS: The heart size and mediastinal contours are within normal limits. Both lungs are clear. The visualized skeletal structures are unremarkable. IMPRESSION: No active disease. Electronically Signed   By: Oneil Devonshire M.D.   On: 11/12/2024 20:46   MR BRAIN WO CONTRAST Result Date: 11/12/2024 EXAM: MRI BRAIN WITHOUT CONTRAST 11/12/2024 07:39:00 PM TECHNIQUE: Multiplanar multisequence MRI of the head/brain was performed without the administration of intravenous contrast. COMPARISON: MR head without contrast 10/18/2024. CLINICAL HISTORY: Neuro deficit, acute, stroke suspected. Slurred speech and right-sided facial droop. FINDINGS: BRAIN AND VENTRICLES: No acute infarct. No intracranial hemorrhage. No mass. No midline shift. No hydrocephalus. Confluent periventricular and scattered subcortical T2 hyperintensities are moderately advanced for age, stable from the prior study. Were noted ischemic changes are again noted in the thalami bilaterally. Remote lacunar infarcts are present in the right superior cerebellum. The sella is unremarkable. Normal flow voids. ORBITS: Bilateral lens replacements are noted. The globes and orbits are otherwise within normal limits. SINUSES AND MASTOIDS: No acute abnormality. BONES AND SOFT TISSUES: Normal marrow signal. No acute soft tissue abnormality. IMPRESSION: 1. No acute intracranial abnormality. 2. Moderately advanced confluent periventricular and scattered subcortical T2 hyperintensities, stable from the prior study. This most likely reflects the sequelae of chronic microvascular ischemia. 3. Chronic ischemic changes in the thalami bilaterally. 4. Remote lacunar infarcts in the right superior cerebellum. Electronically signed by: Lonni Necessary MD 11/12/2024 07:46 PM EST RP Workstation: [id number]    EKG: Independently reviewed.   Assessment/Plan  TIA -admit to  progressive care  - symptoms resolved  - MRI negative  -CTA: ICA with severe 80% stenosis  - continue with eliquis  per neurology -await neuro final recs  -continue on neuro checks  -PT/Occupational Therapy/SLP   Left lower extremity weakness chronic  - ? Due to djd of the spine  - per neuro will need out patient EMG  -no further work up in patient required  Mild to moderate -diffuse spinal stenosis at C2-3 through C6-7, most pronounced at C3-4  and C4-5 -consider ortho/spine consult referral outpatient -pt/ot   Mild hyperkalemia -monitor labs  -treat based on trend    CHFpef - resume home regimen  -metoprolol /cozaar /lasix   - appears euvolemic    DMII -iss/fs  -resume lantus  18 units  Gout -resume outpatient regimen with allopurinol      P Afib -continue on eliquis  -currently in sinus    HTN -resume home regimen as negative mri    OSA -cpap at bedtime   CKDIIIa -at baseline    Anemia of chronic disease -h/h at baselin e   Dementia -at baseline   BPH  -chronic indwelling foley  -continue finasteride    HLD -continue statin  DVT prophylaxis:  Eliquis   Code Status: full/ as discussed per patient wishes in event of cardiac arrest  Family Communication: none Disposition Plan: patient  expected to be admitted greater than 2 midnights  Consults called:  Neurology  Admission status: progressive care    Camila DELENA Ned MD [facility]   If 7PM-7AM, please contact night-coverage [email or web address] Password [email or web address]  11/12/2024, 11:28 PM        [1] No Known Allergies  "

## 2024-11-13 ENCOUNTER — Inpatient Hospital Stay (HOSPITAL_COMMUNITY)

## 2024-11-13 ENCOUNTER — Encounter (HOSPITAL_COMMUNITY): Payer: Self-pay | Admitting: Internal Medicine

## 2024-11-13 DIAGNOSIS — G459 Transient cerebral ischemic attack, unspecified: Secondary | ICD-10-CM | POA: Diagnosis not present

## 2024-11-13 LAB — VITAMIN B12: Vitamin B-12: 905 pg/mL (ref 180–914)

## 2024-11-13 LAB — GLUCOSE, CAPILLARY
Glucose-Capillary: 195 mg/dL — ABNORMAL HIGH (ref 70–99)
Glucose-Capillary: 149 mg/dL — ABNORMAL HIGH (ref 70–99)

## 2024-11-13 LAB — LIPID PANEL
Cholesterol: 143 mg/dL (ref 0–200)
HDL: 49 mg/dL
LDL Cholesterol: 76 mg/dL (ref 0–99)
Total CHOL/HDL Ratio: 2.9 ratio
Triglycerides: 88 mg/dL
VLDL: 18 mg/dL (ref 0–40)

## 2024-11-13 LAB — BASIC METABOLIC PANEL WITH GFR
Anion gap: 15 (ref 5–15)
BUN: 35 mg/dL — ABNORMAL HIGH (ref 8–23)
CO2: 22 mmol/L (ref 22–32)
Calcium: 10.2 mg/dL (ref 8.9–10.3)
Chloride: 105 mmol/L (ref 98–111)
Creatinine, Ser: 0.86 mg/dL (ref 0.61–1.24)
GFR, Estimated: >60 mL/min
Glucose, Bld: 141 mg/dL — ABNORMAL HIGH (ref 70–99)
Potassium: 4.2 mmol/L (ref 3.5–5.1)
Sodium: 141 mmol/L (ref 135–145)

## 2024-11-13 LAB — HEMOGLOBIN A1C
Hgb A1c MFr Bld: 9.2 % — ABNORMAL HIGH (ref 4.8–5.6)
Mean Plasma Glucose: 217.34 mg/dL

## 2024-11-13 LAB — CBG MONITORING, ED
Glucose-Capillary: 146 mg/dL — ABNORMAL HIGH (ref 70–99)
Glucose-Capillary: 201 mg/dL — ABNORMAL HIGH (ref 70–99)

## 2024-11-13 LAB — TSH: TSH: 3.4 u[IU]/mL (ref 0.350–4.500)

## 2024-11-13 LAB — FOLATE: Folate: 13.3 ng/mL

## 2024-11-13 MED ORDER — ALLOPURINOL 100 MG PO TABS
300.0000 mg | ORAL_TABLET | Freq: Every morning | ORAL | Status: DC
  Administered 2024-11-13 – 2024-11-30 (×18): 300 mg via ORAL
  Filled 2024-11-13 (×12): qty 3

## 2024-11-13 MED ORDER — INSULIN ASPART 100 UNIT/ML IJ SOLN
0.0000 [IU] | Freq: Three times a day (TID) | INTRAMUSCULAR | Status: DC
  Administered 2024-11-13: 3 [IU] via SUBCUTANEOUS
  Administered 2024-11-13: 2 [IU] via SUBCUTANEOUS
  Administered 2024-11-14: 1 [IU] via SUBCUTANEOUS
  Administered 2024-11-14 – 2024-11-15 (×4): 2 [IU] via SUBCUTANEOUS
  Administered 2024-11-15: 1 [IU] via SUBCUTANEOUS
  Administered 2024-11-16 (×2): 2 [IU] via SUBCUTANEOUS
  Administered 2024-11-17: 3 [IU] via SUBCUTANEOUS
  Administered 2024-11-17 – 2024-11-18 (×4): 2 [IU] via SUBCUTANEOUS
  Administered 2024-11-18: 3 [IU] via SUBCUTANEOUS
  Administered 2024-11-19: 1 [IU] via SUBCUTANEOUS
  Administered 2024-11-19 – 2024-11-20 (×2): 2 [IU] via SUBCUTANEOUS
  Administered 2024-11-20: 1 [IU] via SUBCUTANEOUS
  Administered 2024-11-20 – 2024-11-22 (×3): 2 [IU] via SUBCUTANEOUS
  Administered 2024-11-22 – 2024-11-23 (×2): 1 [IU] via SUBCUTANEOUS
  Administered 2024-11-23: 2 [IU] via SUBCUTANEOUS
  Administered 2024-11-23 – 2024-11-24 (×2): 1 [IU] via SUBCUTANEOUS
  Administered 2024-11-24: 3 [IU] via SUBCUTANEOUS
  Administered 2024-11-25 (×2): 2 [IU] via SUBCUTANEOUS
  Administered 2024-11-25: 1 [IU] via SUBCUTANEOUS
  Administered 2024-11-26: 2 [IU] via SUBCUTANEOUS
  Administered 2024-11-27 – 2024-11-28 (×3): 1 [IU] via SUBCUTANEOUS
  Filled 2024-11-13 (×3): qty 2
  Filled 2024-11-13: qty 3
  Filled 2024-11-13 (×6): qty 2
  Filled 2024-11-13: qty 1
  Filled 2024-11-13 (×3): qty 2
  Filled 2024-11-13: qty 1
  Filled 2024-11-13: qty 2
  Filled 2024-11-13: qty 1
  Filled 2024-11-13: qty 2
  Filled 2024-11-13: qty 1
  Filled 2024-11-13: qty 2
  Filled 2024-11-13: qty 1

## 2024-11-13 MED ORDER — PANTOPRAZOLE SODIUM 40 MG PO TBEC
40.0000 mg | DELAYED_RELEASE_TABLET | Freq: Every day | ORAL | Status: DC
  Administered 2024-11-13 – 2024-11-30 (×18): 40 mg via ORAL
  Filled 2024-11-13 (×9): qty 1
  Filled 2024-11-13: qty 2
  Filled 2024-11-13 (×2): qty 1

## 2024-11-13 MED ORDER — METOPROLOL TARTRATE 25 MG PO TABS
25.0000 mg | ORAL_TABLET | Freq: Two times a day (BID) | ORAL | Status: DC
  Administered 2024-11-13 – 2024-11-17 (×9): 25 mg via ORAL
  Filled 2024-11-13 (×11): qty 1

## 2024-11-13 MED ORDER — FUROSEMIDE 40 MG PO TABS
40.0000 mg | ORAL_TABLET | Freq: Two times a day (BID) | ORAL | Status: DC
  Administered 2024-11-13 – 2024-11-17 (×10): 40 mg via ORAL
  Filled 2024-11-13 (×2): qty 1
  Filled 2024-11-13: qty 2
  Filled 2024-11-13 (×8): qty 1

## 2024-11-13 MED ORDER — INSULIN ASPART 100 UNIT/ML IJ SOLN
0.0000 [IU] | Freq: Every day | INTRAMUSCULAR | Status: DC
  Administered 2024-11-16: 2 [IU] via SUBCUTANEOUS
  Filled 2024-11-13 (×2): qty 2
  Filled 2024-11-13: qty 5

## 2024-11-13 MED ORDER — ASPIRIN 81 MG PO CHEW
81.0000 mg | CHEWABLE_TABLET | Freq: Every day | ORAL | Status: DC
  Administered 2024-11-13 – 2024-11-30 (×18): 81 mg via ORAL
  Filled 2024-11-13 (×11): qty 1

## 2024-11-13 MED ORDER — INSULIN GLARGINE 100 UNIT/ML ~~LOC~~ SOLN
18.0000 [IU] | Freq: Every day | SUBCUTANEOUS | Status: AC
  Administered 2024-11-13 – 2024-11-28 (×16): 18 [IU] via SUBCUTANEOUS
  Filled 2024-11-13 (×9): qty 0.18

## 2024-11-13 MED ORDER — ACETAMINOPHEN 325 MG PO TABS: 650.0000 mg | ORAL_TABLET | Freq: Four times a day (QID) | ORAL | Status: DC | PRN

## 2024-11-13 MED ORDER — ATORVASTATIN CALCIUM 80 MG PO TABS
80.0000 mg | ORAL_TABLET | Freq: Every day | ORAL | Status: DC
  Administered 2024-11-13 – 2024-11-29 (×17): 80 mg via ORAL
  Filled 2024-11-13 (×11): qty 1

## 2024-11-13 MED ORDER — APIXABAN 5 MG PO TABS
5.0000 mg | ORAL_TABLET | Freq: Two times a day (BID) | ORAL | Status: DC
  Administered 2024-11-13 – 2024-11-30 (×35): 5 mg via ORAL
  Filled 2024-11-13 (×21): qty 1

## 2024-11-13 MED ORDER — FINASTERIDE 5 MG PO TABS
5.0000 mg | ORAL_TABLET | Freq: Every evening | ORAL | Status: DC
  Administered 2024-11-13 – 2024-11-29 (×16): 5 mg via ORAL
  Filled 2024-11-13 (×12): qty 1

## 2024-11-13 MED ORDER — LOSARTAN POTASSIUM 50 MG PO TABS
100.0000 mg | ORAL_TABLET | Freq: Every day | ORAL | Status: DC
  Administered 2024-11-13 – 2024-11-30 (×18): 100 mg via ORAL
  Filled 2024-11-13 (×11): qty 2

## 2024-11-13 NOTE — Care Management (Signed)
 Transition of Care [facility]) - Inpatient Brief Assessment   Patient Details  Name: Raymond Gordon MRN: [id number] Date of Birth: 08/05/1951  Transition of Care [facility] CM/SW Contact:    Corean JAYSON Canary, RN Phone Number: 11/13/2024, 5:17 PM   Clinical Narrative:  Patient presented with TIA, PT and OT have evaluated him and recommended SNF. Called Ms casimir, the patients sister. They are amenable to SNF placement. The patient was last at [facility] health care.   Left note for SNF workup to begin  IPCM will follow  Transition of Care Asessment: Insurance and Status: Insurance coverage has been reviewed Patient has primary care physician: Yes Home environment has been reviewed: Lives with family Prior level of function:: Needs assist   Social Drivers of Health Review: SDOH reviewed no interventions necessary Readmission risk has been reviewed: Yes Transition of care needs: transition of care needs identified, TOC will continue to follow

## 2024-11-13 NOTE — Progress Notes (Addendum)
 " PROGRESS NOTE    Raymond Gordon  FMW:[id number] DOB: 20-Jul-1951 DOA: 11/12/2024 PCP: [name], MD  Chief Complaint  Patient presents with   Stroke Symptoms    Brief Narrative:   Raymond Gordon is Raymond Gordon 73 y.o. male with medical history significant of DMII, Gout, afib, HTN, OSA, Gout, CKD IIIa, Anemia of chronic disease, Dementia, BPH chronic indwelling foley, HLD, Hfpef.   He was found by family, unable to get up from bathroom.  L leg weakness and L arm weakness noted.    Assessment & Plan:   Principal Problem:   TIA (transient ischemic attack)  TIA Stroke Like Symptoms Reported episode of slurred speech and R facial droop, but per neurology note, the patient and his sister denied.   Now seen by neurology who suspects L leg weakness is due to small stroke Raymond Gordon Raymond Gordon (see below) MRI brain without acute intracranial abnormality, moderately advanced confluent periventricular and scattered subcortical T2 hyperintensities, chronic ischemic changes in bilateral thalami, remote lacunar infarcts CTA head/neck stable from 09/2024, bulky calcified plaque about L carotid bulb/proximal L ICA with severe 80% stenosis by NASCET criteria - occluded L vertebral artery continue with eliquis  per neurology PT/OT/SLP Needs outpatient neurology follow up UA not concerning for UTI. Follow b12, folate, TSH.   Left lower extremity weakness  neurology suspects the L leg weakness is related to Raymond Gordon small stroke Raymond Gordon Raymond Gordon  MRI of C/T/L spine without findings that look like they're related, neurology notes MRI spine does not convincingly explain L leg weakness per neuro will need out patient EMG/NCS PT/OT   Degenerative Disc Disease Mild to moderate Diffuse Spinal Stenosis at C2-3 through C6-7 Disc Bulge with Facet Hypertrophy at L4-5 with L worse than R Lateral Recess Stenosis with moderate L worse than R L4 Foraminal Narrowing  Disc Protrusion at L3-4  Bilateral Facet Hypertrophy at  L3-4 through L5-S1 -consider ortho/spine consult referral outpatient -pt/ot    Mild hyperkalemia Trend, follow pending labs this AM   HFpEF Euvolemic on exam metoprolol /cozaar /lasix     DMII SSI lantus  18 units   Gout allopurinol     P Afib continue on eliquis  currently in sinus    HTN Losartan , metoprolol     OSA cpap at bedtime    CKDIIIa -at baseline    Dementia Alert and oriented today, not sure where admitting doc got hx dementia, sister doesn't know this (he lives alone -  Delirium precautions   BPH  -continue finasteride     HLD -continue statin     DVT prophylaxis: eliquis  Code Status: full Family Communication: none Disposition:   Status is: Inpatient Remains inpatient appropriate because: need for continued inpatient care   Consultants:  neurology  Procedures:  none  Antimicrobials:  Anti-infectives (From admission, onward)    None       Subjective: No new complaints Continued LLE weakness  Objective: Vitals:   11/12/24 2030 11/12/24 2100 11/13/24 0206 11/13/24 0600  BP: (!) 148/108 (!) 144/49 (!) 142/57 (!) 174/61  Pulse: (!) 56 (!) 48 (!) 52 (!) 58  Resp: (!) 7 16 19 20   Temp:   (!) 97.5 F (36.4 C) 97.6 F (36.4 C)  TempSrc:   Oral Oral  SpO2: 100% 100% 99% 100%   No intake or output data in the 24 hours ending 11/13/24 0830 There were no vitals filed for this visit.  Examination:  General exam: Appears calm and comfortable  Respiratory system: unlabored Cardiovascular system: RRR  Gastrointestinal system: Abdomen is nondistended, soft and nontender. Central nervous system: CN 2-12 intact, symmetric upper extremity strength - LLE weakness - intact FNF.  Difficulty with heel to shin on L. Extremities: no LEE  Data Reviewed: I have personally reviewed following labs and imaging studies  CBC: Recent Labs  Lab 11/12/24 1858  WBC 5.5  NEUTROABS 3.9  HGB 13.2  HCT 41.6  MCV 91.4  PLT 213    Basic Metabolic  Panel: Recent Labs  Lab 11/12/24 1858  NA 138  K 5.5*  CL 103  CO2 22  GLUCOSE 161*  BUN 42*  CREATININE 1.14  CALCIUM  10.5*    GFR: CrCl cannot be calculated (Unknown ideal weight.).  Liver Function Tests: Recent Labs  Lab 11/12/24 1858  AST 51*  ALT 42  ALKPHOS 103  BILITOT 0.3  PROT 8.0  ALBUMIN 3.7    CBG: No results for input(s): GLUCAP in the last 168 hours.   No results found for this or any previous visit (from the past 240 hours).       Radiology Studies: MR LUMBAR SPINE WO CONTRAST Result Date: 11/13/2024 CLINICAL DATA:  Initial evaluation for lower back pain, ataxia. EXAM: MRI LUMBAR SPINE WITHOUT CONTRAST TECHNIQUE: Multiplanar, multisequence MR imaging of the lumbar spine was performed. No intravenous contrast was administered. COMPARISON:  None Available. FINDINGS: Segmentation: Standard. Lowest well-formed disc space labeled the L5-S1 level. Alignment: 3 mm facet mediated anterolisthesis of L4 on L5. Alignment otherwise normal with preservation of the normal lumbar lordosis. Vertebrae: Vertebral body height maintained without acute or chronic fracture. Bone marrow signal intensity within normal limits. No worrisome osseous lesions. Mild reactive edema of noted about the L4-5 and L5-S1 facets due to facet arthritis. No other abnormal marrow edema. Conus medullaris and cauda equina: Conus extends to the L1-2 level. Conus and cauda equina appear normal. Paraspinal and other soft tissues: Edema noted within the subcutaneous soft tissues of the lower back, nonspecific, but could be related to overall volume status. T2 hyperintense left renal cyst partially visualized, benign in appearance, no follow-up imaging recommended. Disc levels: L1-2:  Unremarkable. L2-3:  Unremarkable. L3-4: Disc desiccation with mild disc bulge. Superimposed small right foraminal to extraforaminal disc protrusion closely approximates the exiting right L3 nerve root. Mild to moderate  bilateral facet hypertrophy with trace joint effusions. Mild narrowing of the lateral recesses bilaterally. Central canal remains patent. Mild bilateral L3 foraminal stenosis. L4-5: 3 mm anterolisthesis. Disc desiccation with diffuse disc bulge. Mild reactive endplate spurring. Moderate bilateral facet hypertrophy with associated small joint effusions. Resultant mild right with moderate left lateral recess stenosis. Central canal remains patent. Moderate left worse than right L4 foraminal narrowing. L5-S1: Normal interspace. Moderate right worse than left facet arthrosis with small joint effusions. No spinal stenosis. Mild bilateral L5 foraminal impaired IMPRESSION: 1. No acute abnormality within the lumbar spine. 2. Disc bulge with facet hypertrophy at L4-5 with resultant moderate left worse than right lateral recess stenosis, with moderate left worse than right L4 foraminal narrowing. 3. Small right foraminal to extraforaminal disc protrusion at L3-4, potentially affecting the exiting right L3 nerve root. 4. Moderate bilateral facet hypertrophy at L3-4 through L5-S1, which could contribute to lower back pain. Electronically Signed   By: Morene Hoard M.D.   On: 11/13/2024 04:02   MR THORACIC SPINE WO CONTRAST Result Date: 11/13/2024 CLINICAL DATA:  Initial evaluation for ataxia, nontraumatic. EXAM: MRI THORACIC SPINE WITHOUT CONTRAST TECHNIQUE: Multiplanar, multisequence MR imaging of the thoracic spine  was performed. No intravenous contrast was administered. COMPARISON:  None Available. FINDINGS: Alignment: Mild dextroscoliosis. Alignment otherwise normal preservation of the normal thoracic kyphosis. No listhesis. Vertebrae: Vertebral body height maintained without acute or chronic fracture. Bone marrow signal intensity within normal limits. No discrete or worrisome osseous lesions. No abnormal marrow edema. Cord:  Normal signal and morphology. Paraspinal and other soft tissues: Mild edema within the  subcutaneous soft tissues of the partially visualized upper back, could be related to overall volume status. Paraspinous soft tissues demonstrate no other acute finding. Disc levels: No significant disc pathology seen within the thoracic spine for age. No significant disc bulge or focal disc herniation. No significant facet disease. No canal or neural foraminal stenosis or evidence for neural impingement. IMPRESSION: Normal MRI of the thoracic spine and spinal cord. No significant disc pathology, stenosis, or evidence for neural impingement. Electronically Signed   By: Morene Hoard M.D.   On: 11/13/2024 03:55   MR CERVICAL SPINE WO CONTRAST Result Date: 11/13/2024 CLINICAL DATA:  Initial evaluation for chronic myelopathy. EXAM: MRI CERVICAL SPINE WITHOUT CONTRAST TECHNIQUE: Multiplanar, multisequence MR imaging of the cervical spine was performed. No intravenous contrast was administered. COMPARISON:  None Available. FINDINGS: Alignment: Examination degraded by motion artifact. Straightening of the normal cervical lordosis.  No listhesis. Vertebrae: Vertebral body height maintained without acute or chronic fracture. Bone marrow signal intensity within normal limits. No discrete or worrisome osseous lesions or abnormal marrow edema. Cord: Normal signal and morphology. Posterior Fossa, vertebral arteries, paraspinal tissues: Craniocervical junction within normal limits. Paraspinous soft tissues demonstrate no acute finding. Loss of normal flow void within the left vertebral artery, consistent with occlusion as seen on prior CTA. Disc levels: C2-C3: Central disc protrusion indents the ventral thecal sac. Moderate right with mild left facet arthrosis. Mild spinal stenosis. Moderate left C3 foraminal narrowing. Right neural foramen remains patent. C3-C4: Degenerate intervertebral disc space narrowing with diffuse disc bulge and uncovertebral spurring. Mild facet and ligament flavum hypertrophy. Resultant  moderate spinal stenosis. Moderate to severe left with moderate right C4 foraminal narrowing. C4-C5: Degenerative intervertebral disc space narrowing with diffuse disc bulge and bilateral uncovertebral spurring. Flattening and partial effacement of the ventral thecal sac. Superimposed facet and ligament flavum hypertrophy. Resultant moderate spinal stenosis with mild cord flattening but no visible cord signal changes. Moderate to severe right worse than left C5 foraminal stenosis. C5-C6: Degenerative disc space narrowing. Small central disc protrusion mildly indents the ventral thecal sac. Right greater than left uncovertebral spurring. Mild spinal stenosis. Mild to moderate left C6 foraminal narrowing. Right neural foramen remains patent. C6-C7: Disc bulge with uncovertebral spurring. Superimposed small central disc protrusion with slight inferior migration, asymmetric to the right. Mild right-sided facet hypertrophy. No more than mild spinal stenosis. Mild to moderate right C7 foraminal narrowing. Left neural foramen appears grossly patent. C7-T1: Negative interspace. No spinal stenosis. Foramina remain adequately patent. IMPRESSION: 1. Motion degraded exam. 2. Normal MRI appearance of the cervical spinal cord. No cord signal changes to suggest myelopathy. 3. Multilevel cervical spondylosis with resultant mild to moderate diffuse spinal stenosis at C2-3 through C6-7, most pronounced at C3-4 and C4-5. 4. Multifactorial degenerative changes with resultant multilevel foraminal narrowing as above. Notable findings include moderate left C3 foraminal stenosis, moderate to severe left with moderate right C4 foraminal narrowing, with moderate to severe right worse than left C5 foraminal stenosis. 5. Loss of normal flow void within the left vertebral artery, consistent with occlusion as seen on prior CTA. Electronically  Signed   By: Morene Hoard M.D.   On: 11/13/2024 03:47   CT ANGIO HEAD NECK W WO CM Result  Date: 11/12/2024 CLINICAL DATA:  Initial evaluation for acute neuro deficit, stroke suspected. EXAM: CT ANGIOGRAPHY HEAD AND NECK WITH AND WITHOUT CONTRAST TECHNIQUE: Multidetector CT imaging of the head and neck was performed using the standard protocol during bolus administration of intravenous contrast. Multiplanar CT image reconstructions and MIPs were obtained to evaluate the vascular anatomy. Carotid stenosis measurements (when applicable) are obtained utilizing NASCET criteria, using the distal internal carotid diameter as the denominator. RADIATION DOSE REDUCTION: This exam was performed according to the departmental dose-optimization program which includes automated exposure control, adjustment of the mA and/or kV according to patient size and/or use of iterative reconstruction technique. CONTRAST:  75mL OMNIPAQUE  IOHEXOL  350 MG/ML SOLN COMPARISON:  Prior MRI from earlier the same day and prior CT from 10/18/2024. FINDINGS: CTA NECK FINDINGS Aortic arch: Visualized aortic arch within normal limits for caliber with standard 3 vessel morphology. Mild aortic atherosclerosis. No significant stenosis about the origin the great vessels. Right carotid system: Right common and internal carotid arteries are patent without dissection. Atheromatous change about the right carotid bulb without hemodynamically significant greater than 50% stenosis. Left carotid system: Left common and internal carotid arteries are patent without dissection. Bulky calcified plaque about the left carotid bulb/proximal left ICA with severe 80% stenosis by NASCET criteria. 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 Atheromatous change at the origin of the right vertebral artery but without hemodynamically significant stenosis. Right vertebral artery patent distally without stenosis or dissection. Skeleton: No worrisome osseous lesions.  Patient is edentulous. Other neck: No other  acute finding. Upper chest: No other acute finding. Review of the MIP images confirms the above findings CTA HEAD FINDINGS Anterior circulation: Atheromatous change about the carotid siphons but without hemodynamically significant stenosis. A1 segments patent bilaterally. Normal anterior communicating artery complex. Anterior cerebral arteries patent without stenosis. No M1 stenosis or occlusion. Left M1 bifurcates early. No proximal MCA branch occlusion or high-grade stenosis. Distal MCA branches perfused and symmetric. Posterior circulation: Left vertebral artery remains occluded to the vertebrobasilar junction. Atheromatous change at the proximal right V4 segment without significant stenosis. Neither PICA visualized. Basilar diminutive but patent without stenosis. Superior cerebral arteries patent bilaterally. Fetal type origin of the PCAs bilaterally both of which remain widely patent to their distal aspects. Venous sinuses: Not well assessed due to timing the contrast bolus. Anatomic variants: Fetal type origin of the PCAs with overall diminutive vertebrobasilar system. No aneurysm. Review of the MIP images confirms the above findings IMPRESSION: 1. Stable CTA as compared to 10/18/2024. No acute large vessel occlusion or other emergent finding. 2. Bulky calcified plaque about the left carotid bulb/proximal left ICA with severe 80% stenosis by NASCET criteria. 3. Occluded left vertebral artery. 4.  Aortic Atherosclerosis (ICD10-I70.0). Electronically Signed   By: Morene Hoard M.D.   On: 11/12/2024 22:11   DG Pelvis Portable Result Date: 11/12/2024 CLINICAL DATA:  Recent fall with pelvic pain, initial encounter EXAM: PORTABLE PELVIS 1-2 VIEWS COMPARISON:  None Available. FINDINGS: Pelvic ring is intact. Degenerative changes of the hip joints are noted left greater than right. Subchondral cyst formation is noted in the left femoral head. No soft tissue abnormality is seen. IMPRESSION: Degenerative  changes without acute abnormality. Electronically Signed   By: Oneil Devonshire M.D.   On: 11/12/2024 20:46   DG Chest  Port 1 View Result Date: 11/12/2024 CLINICAL DATA:  Weakness EXAM: PORTABLE CHEST 1 VIEW COMPARISON:  05/31/2024 FINDINGS: The heart size and mediastinal contours are within normal limits. Both lungs are clear. The visualized skeletal structures are unremarkable. IMPRESSION: No active disease. Electronically Signed   By: Oneil Devonshire M.D.   On: 11/12/2024 20:46   MR BRAIN WO CONTRAST Result Date: 11/12/2024 EXAM: MRI BRAIN WITHOUT CONTRAST 11/12/2024 07:39:00 PM TECHNIQUE: Multiplanar multisequence MRI of the head/brain was performed without the administration of intravenous contrast. COMPARISON: MR head without contrast 10/18/2024. CLINICAL HISTORY: Neuro deficit, acute, stroke suspected. Slurred speech and right-sided facial droop. FINDINGS: BRAIN AND VENTRICLES: No acute infarct. No intracranial hemorrhage. No mass. No midline shift. No hydrocephalus. Confluent periventricular and scattered subcortical T2 hyperintensities are moderately advanced for age, stable from the prior study. Were noted ischemic changes are again noted in the thalami bilaterally. Remote lacunar infarcts are present in the right superior cerebellum. The sella is unremarkable. Normal flow voids. ORBITS: Bilateral lens replacements are noted. The globes and orbits are otherwise within normal limits. SINUSES AND MASTOIDS: No acute abnormality. BONES AND SOFT TISSUES: Normal marrow signal. No acute soft tissue abnormality. IMPRESSION: 1. No acute intracranial abnormality. 2. Moderately advanced confluent periventricular and scattered subcortical T2 hyperintensities, stable from the prior study. This most likely reflects the sequelae of chronic microvascular ischemia. 3. Chronic ischemic changes in the thalami bilaterally. 4. Remote lacunar infarcts in the right superior cerebellum. Electronically signed by: Lonni Necessary MD 11/12/2024 07:46 PM EST RP Workstation: [id number]        Scheduled Meds:   stroke: early stages of recovery book   Does not apply Once   allopurinol   300 mg Oral q AM   apixaban   5 mg Oral BID   aspirin   81 mg Oral Daily   atorvastatin   80 mg Oral QHS   finasteride   5 mg Oral QPM   furosemide   40 mg Oral BID   insulin  glargine  18 Units Subcutaneous QHS   losartan   100 mg Oral Daily   metoprolol  tartrate  25 mg Oral BID   pantoprazole   40 mg Oral Daily   Continuous Infusions:   LOS: 1 day    Time spent: over 30 min     Meliton Monte, MD [facility]   To contact the attending provider between 7A-7P or the covering provider during after hours 7P-7A, please log into the web site [email or web address] and access using universal [place] password for that web site. If you do not have the password, please call the hospital operator.  11/13/2024, 8:30 AM    "

## 2024-11-13 NOTE — Evaluation (Signed)
 Physical Therapy Evaluation Patient Details Name: Raymond Gordon MRN: [id number] DOB: September 09, 1951 Today's Date: 11/13/2024  History of Present Illness  Pt is a 73 y/o M who presented to [facility] ED with episode of slurred speech and RFD. MRI brain negative for acute findings. PMHx: DMII, HLD, gout, afib, HTN, CHF, OSA, CKDIII, anemia, dementia, BPH with chronic indwelling foley catheter.  Clinical Impression  Pt presents to PT with deficits in functional mobility, gait, balance, strength, power, endurance. Pt reports only current deficits are LLE weakness which he states are new, MD note indicates pt had a recent stroke with LLE weakness although no family/caregivers present to compare current strength to that recently since CVA. Pt currently requires assistance for functional mobility and experiences a leftward loss of balance when ambulating for a short distance this morning. Pt is at a high risk for falls and lives alone. Patient will benefit from continued inpatient follow up therapy, <3 hours/day.        If plan is discharge home, recommend the following: A lot of help with bathing/dressing/bathroom;A little help with walking and/or transfers;Assistance with cooking/housework;Assist for transportation;Help with stairs or ramp for entrance   Can travel by private vehicle   Yes    Equipment Recommendations None recommended by PT  Recommendations for Other Services       Functional Status Assessment Patient has had a recent decline in their functional status and demonstrates the ability to make significant improvements in function in a reasonable and predictable amount of time.     Precautions / Restrictions Precautions Precautions: Fall Recall of Precautions/Restrictions: Intact Restrictions Weight Bearing Restrictions Per Provider Order: No      Mobility  Bed Mobility Overal bed mobility: Needs Assistance Bed Mobility: Supine to Sit, Sit to Supine     Supine to sit: Min  assist Sit to supine: Min assist        Transfers Overall transfer level: Needs assistance Equipment used: Rolling walker (2 wheels) Transfers: Sit to/from Stand Sit to Stand: Contact guard assist, From elevated surface                Ambulation/Gait Ambulation/Gait assistance: Min assist Gait Distance (Feet): 15 Feet Assistive device: Rolling walker (2 wheels) Gait Pattern/deviations: Step-to pattern, Staggering right Gait velocity: reduced Gait velocity interpretation: <1.31 ft/sec, indicative of household ambulator   General Gait Details: pt with rightward loss of balance when turning near stretcher requiring PT assist to correct. Short shuffling steps  Stairs            Wheelchair Mobility     Tilt Bed    Modified Rankin (Stroke Patients Only)       Balance Overall balance assessment: Needs assistance Sitting-balance support: No upper extremity supported, Feet supported Sitting balance-Leahy Scale: Fair     Standing balance support: Bilateral upper extremity supported, Reliant on assistive device for balance Standing balance-Leahy Scale: Poor                               Pertinent Vitals/Pain Pain Assessment Pain Assessment: No/denies pain    Home Living Family/patient expects to be discharged to:: Private residence Living Arrangements: Alone Available Help at Discharge: Family;Available PRN/intermittently Type of Home: House Home Access: Stairs to enter Entrance Stairs-Rails: Doctor, [facility] of Steps: 4   Home Layout: One level Home Equipment: Rollator (4 wheels);Shower seat;BSC/3in1;Cane - single point;Wheelchair - manual;Grab bars - toilet;Grab bars - tub/shower  Prior Function Prior Level of Function : Needs assist             Mobility Comments: ambulates with a rollator in the home, [facility] when in the community ADLs Comments: Mod I for selfcare, fixing his meals.  Neighbor across the street  helps with cleaning.     Extremity/Trunk Assessment   Upper Extremity Assessment Upper Extremity Assessment: Generalized weakness    Lower Extremity Assessment Lower Extremity Assessment: LLE deficits/detail;RLE deficits/detail RLE Deficits / Details: ROM WFL, grossly 4-/5 LLE Deficits / Details: ROM WFL, grossly 3+/5    Cervical / Trunk Assessment Cervical / Trunk Assessment: Kyphotic  Communication   Communication Communication: No apparent difficulties    Cognition Arousal: Alert Behavior During Therapy: WFL for tasks assessed/performed   PT - Cognitive impairments: History of cognitive impairments                       PT - Cognition Comments: dementia noted in PMH, pt is alert and oriented. Increased time to follow commands, otherwise appropriate Following commands: Impaired Following commands impaired: Follows multi-step commands with increased time     Cueing Cueing Techniques: Verbal cues     General Comments General comments (skin integrity, edema, etc.): VSS on RA, pt in NAD    Exercises     Assessment/Plan    PT Assessment Patient needs continued PT services  PT Problem List Decreased strength;Decreased activity tolerance;Decreased balance;Decreased mobility;Decreased knowledge of use of DME       PT Treatment Interventions DME instruction;Gait training;Stair training;Functional mobility training;Therapeutic exercise;Therapeutic activities;Balance training;Neuromuscular re-education;Patient/family education    PT Goals (Current goals can be found in the Care Plan section)  Acute Rehab PT Goals Patient Stated Goal: to return to independent ambulation, improve LLE strength PT Goal Formulation: With patient Time For Goal Achievement: 11/27/24 Potential to Achieve Goals: Good    Frequency Min 2X/week     Co-evaluation               AM-PAC PT 6 Clicks Mobility  Outcome Measure Help needed turning from your back to your side while  in a flat bed without using bedrails?: A Little Help needed moving from lying on your back to sitting on the side of a flat bed without using bedrails?: A Little Help needed moving to and from a bed to a chair (including a wheelchair)?: A Little Help needed standing up from a chair using your arms (e.g., wheelchair or bedside chair)?: A Little Help needed to walk in hospital room?: A Lot Help needed climbing 3-5 steps with a railing? : Total 6 Click Score: 15    End of Session Equipment Utilized During Treatment: Gait belt Activity Tolerance: Patient tolerated treatment well Patient left: in bed;with call bell/phone within reach Nurse Communication: Mobility status PT Visit Diagnosis: Other abnormalities of gait and mobility (R26.89);Muscle weakness (generalized) (M62.81)    Time: 9046-8981 PT Time Calculation (min) (ACUTE ONLY): 25 min   Charges:   PT Evaluation $PT Eval Low Complexity: 1 Low   PT General Charges $$ ACUTE PT VISIT: 1 Visit         Bernardino JINNY Ruth, PT, DPT Acute Rehabilitation Office [phone]   Bernardino JINNY Ruth 11/13/2024, 10:24 AM

## 2024-11-13 NOTE — Progress Notes (Signed)
 Echo attempted, patient is off the floor. Will retry as schedule permits.  [facility] Maythe Deramo RDCS

## 2024-11-13 NOTE — Care Management CC44 (Signed)
"         Condition Code 44 Documentation Completed  Patient Details  Name: Raymond Gordon MRN: [id number] Date of Birth: July 28, 1951   Condition Code 44 given:  Yes Patient signature on Condition Code 44 notice:  Yes Documentation of 2 MD's agreement:  Yes Code 44 added to claim:  Yes    Corean JAYSON Canary, RN 11/13/2024, 5:03 PM  "

## 2024-11-13 NOTE — Evaluation (Signed)
 Occupational Therapy Evaluation Patient Details Name: Raymond Gordon MRN: [id number] DOB: Aug 25, 1951 Today's Date: 11/13/2024   History of Present Illness   Pt is a 73 y/o M who presented to [facility] ED with episode of slurred speech and RFD. MRI brain negative for acute findings. PMHx: DMII, HLD, gout, afib, HTN, CHF, OSA, CKDIII, anemia, dementia, BPH with chronic indwelling foley catheter.     Clinical Impressions Pt seen for OT eval this PM, agreeable for session. Supportive sisters present at bedside. PTA, pt was living alone and was indep with ADLs and mobility with RW. Has assistance for driving and household maintenance/medication mgmt from family. He presents today rather anxious re: mobility and fearful of falling. Functionally, he required up to min A to stand from edge of ED litter and was min/mod A for stepping towards HOB with RW. Heavily reliant on BUE for support at EOB (suspect 2/2 setup of ED litter rather than true balance deficits in sitting), requiring total A for LB ADLs and min/mod A for UB ADLs. VSS throughout.   Pt is currently functioning below baseline and would benefit from ongoing acute OT services to progress towards safe discharge and to facilitate return to prior level of function. Current recommendation is post-acute rehab (< 3 hours/day).     If plan is discharge home, recommend the following:   A lot of help with walking and/or transfers;Two people to help with bathing/dressing/bathroom;Assistance with cooking/housework;Direct supervision/assist for medications management;Direct supervision/assist for financial management;Assist for transportation;Help with stairs or ramp for entrance     Functional Status Assessment   Patient has had a recent decline in their functional status and demonstrates the ability to make significant improvements in function in a reasonable and predictable amount of time.     Equipment Recommendations   Other (comment) (defer)      Recommendations for Other Services         Precautions/Restrictions   Precautions Precautions: Fall Recall of Precautions/Restrictions: Intact Precaution/Restrictions Comments: fearful of falling Restrictions Weight Bearing Restrictions Per Provider Order: No     Mobility Bed Mobility Overal bed mobility: Needs Assistance Bed Mobility: Supine to Sit, Sit to Supine     Supine to sit: Min assist Sit to supine: Mod assist   General bed mobility comments: incr assist for LE mgmt sit>supine back into ED litter    Transfers Overall transfer level: Needs assistance Equipment used: Rolling walker (2 wheels) Transfers: Sit to/from Stand Sit to Stand: Min assist           General transfer comment: Stood from ED litter with assist for powering up and VC for hand placement. Pt extremely fearful of falling, requiring frequent reassurance.      Balance Overall balance assessment: Needs assistance Sitting-balance support: Feet supported, Bilateral upper extremity supported Sitting balance-Leahy Scale: Poor Sitting balance - Comments: seated EOB, heavily reliant on BUE for support at EOB to prevent sliding off ED litter Postural control: Posterior lean Standing balance support: Bilateral upper extremity supported, During functional activity, Reliant on assistive device for balance Standing balance-Leahy Scale: Poor Standing balance comment: reliant on RW, often leaning posteriorly, cues for forward gaze and WS                           ADL either performed or assessed with clinical judgement   ADL Overall ADL's : Needs assistance/impaired Eating/Feeding: Set up   Grooming: Set up;Bed level  Upper Body Dressing : Minimal assistance;Sitting   Lower Body Dressing: Total assistance;Sitting/lateral leans       Toileting- Clothing Manipulation and Hygiene: Maximal assistance       Functional mobility during ADLs: Minimal assistance;Rolling  walker (2 wheels)       Vision Baseline Vision/History: 1 Wears glasses (full time glasses) Ability to See in Adequate Light: 0 Adequate Patient Visual Report: No change from baseline Vision Assessment?: No apparent visual deficits     Perception         Praxis         Pertinent Vitals/Pain Pain Assessment Pain Assessment: No/denies pain     Extremity/Trunk Assessment Upper Extremity Assessment Upper Extremity Assessment: Generalized weakness   Lower Extremity Assessment Lower Extremity Assessment: Defer to PT evaluation   Cervical / Trunk Assessment Cervical / Trunk Assessment: Kyphotic   Communication Communication Communication: No apparent difficulties   Cognition Arousal: Alert Behavior During Therapy: Anxious (fearful of falling) Cognition: No apparent impairments             OT - Cognition Comments: forgetful, pleasant & participatory                 Following commands: Impaired Following commands impaired: Follows multi-step commands with increased time     Cueing  General Comments   Cueing Techniques: Verbal cues;Gestural cues  VSS on RA throughout, supportive sisters present during OT session   Exercises     Shoulder Instructions      Home Living Family/patient expects to be discharged to:: Private residence Living Arrangements: Alone Available Help at Discharge: Family;Available PRN/intermittently Type of Home: House Home Access: Stairs to enter [employer] of Steps: 4 Entrance Stairs-Rails: Right;Left Home Layout: One level     Bathroom Shower/Tub: Walk-in shower;Tub/shower unit;Other (comment)   Bathroom Toilet: Handicapped height     Home Equipment: Rollator (4 wheels);Shower seat;BSC/3in1;Cane - single point;Wheelchair - manual;Grab bars - toilet;Grab bars - tub/shower          Prior Functioning/Environment Prior Level of Function : Needs assist             Mobility Comments: ambulates with a  rollator in the home, [facility] when in the community ADLs Comments: Mod I for selfcare, fixing his meals.  Neighbor across the street helps with cleaning.    OT Problem List: Decreased strength;Decreased range of motion;Decreased activity tolerance;Impaired balance (sitting and/or standing);Decreased safety awareness;Obesity;Increased edema   OT Treatment/Interventions: Self-care/ADL training;Therapeutic exercise;Energy conservation;DME and/or AE instruction;Therapeutic activities;Patient/family education;Balance training      OT Goals(Current goals can be found in the care plan section)   Acute Rehab OT Goals Patient Stated Goal: get better OT Goal Formulation: With patient/family Time For Goal Achievement: 11/27/24 Potential to Achieve Goals: Good   OT Frequency:  Min 2X/week    Co-evaluation              AM-PAC OT 6 Clicks Daily Activity     Outcome Measure Help from another person eating meals?: None Help from another person taking care of personal grooming?: A Little Help from another person toileting, which includes using toliet, bedpan, or urinal?: Total Help from another person bathing (including washing, rinsing, drying)?: A Lot Help from another person to put on and taking off regular upper body clothing?: A Little Help from another person to put on and taking off regular lower body clothing?: Total 6 Click Score: 14   End of Session Equipment Utilized During Treatment: Gait belt;Rolling walker (2 wheels)  Nurse Communication: Mobility status  Activity Tolerance: Patient tolerated treatment well Patient left: in bed;with call bell/phone within reach;with family/visitor present  OT Visit Diagnosis: Unsteadiness on feet (R26.81);History of falling (Z91.81);Muscle weakness (generalized) (M62.81)                Time: 8776-8756 OT Time Calculation (min): 20 min Charges:  OT General Charges $OT Visit: 1 Visit OT Evaluation $OT Eval Moderate Complexity: 1  Mod  Raymond Gordon, OTR/L [facility] Acute Rehabilitation Services [phone] Secure Chat Preferred  Raymond Gordon 11/13/2024, 2:55 PM

## 2024-11-13 NOTE — Progress Notes (Signed)
 OT Cancel Note    11/13/24 0844  OT Visit Information  Last OT Received On 11/13/24  Reason Eval/Treat Not Completed Other (comment) (Attempted to see pt this AM, SBP elevated > 180, RN present planning to give meds. Will continue efforts as able.)    Kathaleya Mcduffee M. Jadae Steinke, OTR/L [facility] Acute Rehabilitation Services [phone] Secure Chat Preferred

## 2024-11-13 NOTE — Care Management Obs Status (Signed)
 MEDICARE OBSERVATION STATUS NOTIFICATION   Patient Details  Name: CASSIDY TABET MRN: [id number] Date of Birth: 08/02/1951   Medicare Observation Status Notification Given:  Yes    Corean JAYSON Canary, RN 11/13/2024, 5:03 PM

## 2024-11-13 NOTE — Plan of Care (Signed)
 Received from ED.  Oriented to room and surroundings.  Discussed stroke and diabetes and skin care.    Problem: Education: Goal: Knowledge of secondary prevention will improve (MUST DOCUMENT ALL) Outcome: Progressing   Problem: Education: Goal: Knowledge of patient specific risk factors will improve (DELETE if not current risk factor) Outcome: Progressing   Problem: Ischemic Stroke/TIA Tissue Perfusion: Goal: Complications of ischemic stroke/TIA will be minimized Outcome: Progressing   Problem: Nutrition: Goal: Risk of aspiration will decrease Outcome: Progressing   Problem: Nutrition: Goal: Dietary intake will improve Outcome: Progressing   Problem: Skin Integrity: Goal: Risk for impaired skin integrity will decrease Outcome: Progressing   Problem: Activity: Goal: Risk for activity intolerance will decrease Outcome: Progressing   Problem: Nutrition: Goal: Adequate nutrition will be maintained Outcome: Progressing

## 2024-11-14 ENCOUNTER — Observation Stay (HOSPITAL_COMMUNITY)

## 2024-11-14 DIAGNOSIS — I480 Paroxysmal atrial fibrillation: Secondary | ICD-10-CM

## 2024-11-14 DIAGNOSIS — G459 Transient cerebral ischemic attack, unspecified: Secondary | ICD-10-CM

## 2024-11-14 DIAGNOSIS — R29898 Other symptoms and signs involving the musculoskeletal system: Secondary | ICD-10-CM | POA: Diagnosis not present

## 2024-11-14 LAB — GLUCOSE, CAPILLARY
Glucose-Capillary: 147 mg/dL — ABNORMAL HIGH (ref 70–99)
Glucose-Capillary: 186 mg/dL — ABNORMAL HIGH (ref 70–99)
Glucose-Capillary: 190 mg/dL — ABNORMAL HIGH (ref 70–99)
Glucose-Capillary: 148 mg/dL — ABNORMAL HIGH (ref 70–99)

## 2024-11-14 LAB — ECHOCARDIOGRAM COMPLETE
Area-P 1/2: 3.08 cm2
Height: 64 in
S' Lateral: 3.2 cm

## 2024-11-14 MED ORDER — PERFLUTREN LIPID MICROSPHERE
1.0000 mL | INTRAVENOUS | Status: AC | PRN
  Administered 2024-11-14: 3 mL via INTRAVENOUS

## 2024-11-14 NOTE — Progress Notes (Signed)
 Echocardiogram 2D Echocardiogram has been performed.  Raymond Gordon 11/14/2024, 12:05 PM

## 2024-11-14 NOTE — Evaluation (Signed)
 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   Pain due to onychomycosis of toenails of both feet 04/12/2021   Vitreomacular adhesion of both eyes 04/03/2021   Moderate nonproliferative diabetic retinopathy of both eyes without macular edema associated with type 2 diabetes mellitus (HCC) 04/03/2021   Posterior capsular opacification, right eye 04/03/2021   Posterior capsular opacification, left 04/03/2021   Thrombophilia 04/10/2020   Chronic anticoagulation 12/19/2016   Chronic diastolic heart failure (HCC) 09/13/2015   Angina pectoris 09/12/2015   Bacteriuria, asymptomatic 08/19/2015   Acute urinary obstruction 07/08/2015   Prolonged Q-T interval on ECG 07/08/2015   Severe OSA (obstructive sleep apnea) 06/15/2015   Pulmonary edema 05/20/2014   Syncope and collapse 05/20/2014    Class: Acute   CHF (congestive heart failure), NYHA class I (HCC) 05/20/2014   Paroxysmal A-fib (HCC) 11/30/2013   Essential hypertension 11/30/2013   Type 2 diabetes mellitus with hyperlipidemia (HCC) 11/30/2013   Cardiomegaly 02/16/2013   Elevated PSA 02/16/2013   Gout 12/04/2009   Pure hypercholesterolemia 10/28/2009   Past Medical History:  Past Medical History:  Diagnosis Date   Atrial fibrillation with RVR (HCC) 07/08/2015   Bilateral lower extremity edema    Chronic diastolic CHF (congestive heart failure) (HCC) 08/15/2015   a. 06/2015: echo showing EF of 65-70% with Grade 2 DD   Diabetes (HCC)    INSULIN  DEPENDENT   Dysrhythmia    Hyperglycemia due to type 2 diabetes mellitus (HCC) 04/11/2021   Hyperlipidemia     Hypertension    Hypoglycemia 05/20/2014   Obesity    Obesity    Paroxysmal atrial fibrillation (HCC)    Sleep apnea    Past Surgical History:  Past Surgical History:  Procedure Laterality Date   CARDIAC CATHETERIZATION     CARDIAC CATHETERIZATION N/A 2012   no large vessel occlusions   IR ANGIOGRAM SELECTIVE EACH ADDITIONAL VESSEL  08/05/2024   IR ANGIOGRAM SELECTIVE EACH ADDITIONAL VESSEL  08/05/2024   IR ANGIOGRAM VISCERAL SELECTIVE  08/05/2024   IR ANGIOGRAM VISCERAL SELECTIVE  08/05/2024   IR EMBO ARTERIAL NOT HEMORR HEMANG INC GUIDE ROADMAPPING  08/05/2024   IR RADIOLOGIST EVAL & MGMT  05/03/2024   IR RADIOLOGIST EVAL & MGMT  09/06/2024   IR US  GUIDE VASC ACCESS LEFT  08/05/2024   HPI:  Pt is a 73 y/o M who presented to [facility] ED with episode of slurred speech and RFD. MRI brain negative for acute findings. Dx with suspected TIA. PMHx: DMII, HLD, gout, afib, HTN, CHF, OSA, CKDIII, anemia, dementia, BPH with chronic indwelling foley catheter.   Assessment / Plan / Recommendation Clinical Impression  Patient presents with cognitive deficits in the areas of attention, awareness, problem solving, reasoning, and safety awareness. Communication of needs intact and although patient reports that family noted dysarthria upon admission, patient without focal oral deficits and reports that speech is at baseline. Per MD, should be discharging once SNF is located. Patient in agreement with short term SNF. No further acute needs indicated however recommend SLP f/u at SNF to address cognitive function for hopeful return to independent living.    SLP Assessment  SLP Recommendation/Assessment: All further  Speech Language Pathology needs can be addressed in the next venue of care SLP Visit Diagnosis: Attention and concentration deficit Attention and concentration deficit following: Cerebral infarction     Assistance Recommended at Discharge  Frequent or constant Supervision/Assistance  Functional Status  Assessment Patient has had a recent decline in their functional status and demonstrates the ability to make significant improvements in function in a reasonable and predictable amount of time.  Frequency and Duration           SLP Evaluation Cognition  Overall Cognitive Status: No family/caregiver present to determine baseline cognitive functioning Arousal/Alertness: Awake/alert Orientation Level: Oriented X4 Attention: Sustained Sustained Attention: Impaired Sustained Attention Impairment: Functional complex;Verbal complex Memory: Impaired Memory Impairment: Storage deficit;Retrieval deficit Awareness: Impaired Awareness Impairment: Intellectual impairment Problem Solving: Impaired Problem Solving Impairment: Verbal complex Safety/Judgment: Impaired       Comprehension  Auditory Comprehension Overall Auditory Comprehension: Impaired Commands: Impaired Multistep Basic Commands: 75-100% accurate Conversation: Simple Interfering Components: Attention Visual Recognition/Discrimination Discrimination: Within Function Limits Reading Comprehension Reading Status: Within funtional limits    Expression Expression Primary Mode of Expression: Verbal Verbal Expression Overall Verbal Expression: Appears within functional limits for tasks assessed Written Expression Written Expression: Not tested   Oral / Motor  Oral Motor/Sensory Function Overall Oral Motor/Sensory Function: Within functional limits Motor Speech Overall Motor Speech: Appears within functional limits for tasks assessed (per patient, baseline)           Rea Pass MA, CCC-SLP  Takiya Belmares Meryl 11/14/2024, 9:34 AM

## 2024-11-14 NOTE — Progress Notes (Signed)
 [facility]  PROGRESS NOTE  Raymond Gordon FMW:[id number] DOB: 10-23-1951 DOA: 11/12/2024 PCP: [name], Richard W, MD   Brief HPI:    73 y.o. male with medical history significant of DMII, Gout, afib, HTN, OSA, Gout, CKD IIIa, Anemia of chronic disease, Dementia, BPH chronic indwelling foley, HLD, Hfpef.    He was found by family, unable to get up from bathroom.  L leg weakness and L arm weakness noted.        Assessment/Plan:   TIA Stroke Like Symptoms -Presented with episode of slurred speech and right facial droop - Seen by neurology, suspected left leg weakness is due to small stroke few months ago -MRI brain without any acute abnormality, chronic ischemic changes, remote lacunar infarcts -CTA head and neck stable from 11/25, bulky calcified plaque about L carotid bulb/proximal L ICA with severe 80% stenosis by NASCET criteria - occluded L vertebral artery continue with eliquis  per neurology PT/OT/SLP Needs outpatient neurology follow up UA not concerning for UTI. Follow b12, folate, TSH.   Left lower extremity weakness  neurology suspects the L leg weakness is related to a small stroke a few months ago  MRI of C/T/L spine without findings that look like they're related, neurology notes MRI spine does not convincingly explain L leg weakness per neuro will need out patient EMG/NCS PT/OT   Degenerative Disc Disease Mild to moderate Diffuse Spinal Stenosis at C2-3 through C6-7 Disc Bulge with Facet Hypertrophy at L4-5 with L worse than R Lateral Recess Stenosis with moderate L worse than R L4 Foraminal Narrowing  Disc Protrusion at L3-4  Bilateral Facet Hypertrophy at L3-4 through L5-S1 -consider ortho/spine consult referral outpatient -pt/ot    Mild hyperkalemia Resolved   HFpEF Euvolemic on exam metoprolol /cozaar /lasix     DMII SSI lantus  18 units CBG well-controlled   Gout allopurinol     P Afib continue on eliquis  currently in sinus    HTN Losartan ,  metoprolol     OSA cpap at bedtime    CKDIIIa -at baseline    Dementia Alert and oriented today, not sure where admitting doc got hx dementia, sister doesn't know this (he lives alone -  Delirium precautions   BPH  -continue finasteride     HLD -continue statin       DVT prophylaxis: Apixaban   Medications      stroke: early stages of recovery book   Does not apply Once   allopurinol   300 mg Oral q AM   apixaban   5 mg Oral BID   aspirin   81 mg Oral Daily   atorvastatin   80 mg Oral QHS   finasteride   5 mg Oral QPM   furosemide   40 mg Oral BID   insulin  aspart  0-5 Units Subcutaneous QHS   insulin  aspart  0-9 Units Subcutaneous TID WC   insulin  glargine  18 Units Subcutaneous QHS   losartan   100 mg Oral Daily   metoprolol  tartrate  25 mg Oral BID   pantoprazole   40 mg Oral Daily     Data Reviewed:   CBG:  Recent Labs  Lab 11/13/24 0859 11/13/24 1208 11/13/24 1627 11/13/24 2207 11/14/24 0607  GLUCAP 146* 201* 195* 149* 147*    SpO2: 98 %    Vitals:   11/13/24 1931 11/13/24 2200 11/14/24 0600 11/14/24 0749  BP:  (!) 167/58 (!) 140/78 (!) 167/42  Pulse:  65 (!) 52 (!) 55  Resp:  18 20 20   Temp:  97.6 F (36.4 C) 99.8 F (37.7 C) (!)  97.3 F (36.3 C)  TempSrc:  Axillary Axillary Oral  SpO2:  100% 100% 98%  Height: 5' 4 (1.626 m)         Data Reviewed:  Basic Metabolic Panel: Recent Labs  Lab 11/12/24 1858 11/13/24 0627  NA 138 141  K 5.5* 4.2  CL 103 105  CO2 22 22  GLUCOSE 161* 141*  BUN 42* 35*  CREATININE 1.14 0.86  CALCIUM  10.5* 10.2    CBC: Recent Labs  Lab 11/12/24 1858  WBC 5.5  NEUTROABS 3.9  HGB 13.2  HCT 41.6  MCV 91.4  PLT 213    LFT Recent Labs  Lab 11/12/24 1858  AST 51*  ALT 42  ALKPHOS 103  BILITOT 0.3  PROT 8.0  ALBUMIN 3.7     Antibiotics: Anti-infectives (From admission, onward)    None        CONSULTS   Code Status: Full code  Family Communication: No family at  bedside     Subjective   Denies any complaints   Objective    Physical Examination:  General-appears in no acute distress Heart-S1-S2, regular, no murmur auscultated Lungs-clear to auscultation bilaterally, no wheezing or crackles auscultated Abdomen-soft, nontender, no organomegaly Extremities-no edema in the lower extremities Neuro-alert, oriented x3, no focal deficit noted            Maxwell Lemen S Simon Llamas   [facility] If 7PM-7AM, please contact night-coverage at [email or web address], Office  [phone]   11/14/2024, 9:13 AM  LOS: 1 day

## 2024-11-14 NOTE — NC FL2 (Signed)
 " [place]  MEDICAID FL2 LEVEL OF CARE FORM     IDENTIFICATION  Patient Name: Raymond Gordon Birthdate: [birth year] Sex: male Admission Date (Current Location): 11/12/2024  [facility] and [place] Number:  Producer, Television/film/video and Address:  The [facility]. [facility], [address], [place], [name] [address]      Provider Number: [id number]  Attending Physician Name and Address:  Raymond Sabas RAMAN, MD  Relative Name and Phone Number:  Raymond Gordon (Sister)  [phone]    Current Level of Care: Hospital Recommended Level of Care: Skilled Nursing Facility Prior Approval Number:    Date Approved/Denied:   PASRR Number: [id number]  Discharge Plan: SNF    Current Diagnoses: Patient Active Problem List   Diagnosis Date Noted   TIA (transient ischemic attack) 11/12/2024   AKI (acute kidney injury) 05/31/2024   Obesity, class 3 (HCC) 03/10/2024   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   Pain due to onychomycosis of toenails of both feet 04/12/2021   Vitreomacular adhesion of both eyes 04/03/2021   Moderate nonproliferative diabetic retinopathy of both eyes without macular edema associated with type 2 diabetes mellitus (HCC) 04/03/2021   Posterior capsular opacification, right eye 04/03/2021   Posterior capsular opacification, left 04/03/2021   Thrombophilia 04/10/2020   Chronic anticoagulation 12/19/2016   Chronic diastolic heart failure (HCC) 09/13/2015   Angina pectoris 09/12/2015   Bacteriuria, asymptomatic 08/19/2015   Acute urinary obstruction 07/08/2015   Prolonged Q-T interval on ECG 07/08/2015   Severe OSA (obstructive sleep apnea) 06/15/2015   Pulmonary edema 05/20/2014   Syncope and collapse 05/20/2014   CHF (congestive heart failure), NYHA class I (HCC) 05/20/2014   Paroxysmal A-fib (HCC) 11/30/2013   Essential hypertension 11/30/2013   Type 2 diabetes mellitus with hyperlipidemia (HCC) 11/30/2013   Cardiomegaly 02/16/2013   Elevated PSA 02/16/2013   Gout 12/04/2009    Pure hypercholesterolemia 10/28/2009    Orientation RESPIRATION BLADDER Height & Weight     Self, Time, Situation, Place  Normal External catheter Weight:   Height:  5' 4 (162.6 cm)  BEHAVIORAL SYMPTOMS/MOOD NEUROLOGICAL BOWEL NUTRITION STATUS      Continent Diet (See DC Summary)  AMBULATORY STATUS COMMUNICATION OF NEEDS Skin   Limited Assist Verbally Normal                       Personal Care Assistance Level of Assistance  Bathing, Feeding, Dressing Bathing Assistance: Maximum assistance Feeding assistance: Independent Dressing Assistance: Maximum assistance     Functional Limitations Info  Sight, Hearing, Speech Sight Info: Impaired Hearing Info: Adequate Speech Info: Impaired    SPECIAL CARE FACTORS FREQUENCY  PT (By licensed PT), OT (By licensed OT)     PT Frequency: 5x/week OT Frequency: 5x/week            Contractures Contractures Info: Not present    Additional Factors Info  Code Status, Allergies Code Status Info: Full Allergies Info: NKA           Current Medications (11/14/2024):  This is the current hospital active medication list Current Facility-Administered Medications  Medication Dose Route Frequency Provider Last Rate Last Admin    stroke: early stages of recovery book   Does not apply Once Debby Camila LABOR, MD       acetaminophen  (TYLENOL ) tablet 650 mg  650 mg Oral Q6H PRN Debby Camila LABOR, MD       allopurinol  (ZYLOPRIM ) tablet 300 mg  300 mg Oral q AM Debby Camila LABOR,  MD   300 mg at 11/13/24 1056   apixaban  (ELIQUIS ) tablet 5 mg  5 mg Oral BID Thomas, Sara-Maiz A, MD   5 mg at 11/13/24 2222   aspirin  chewable tablet 81 mg  81 mg Oral Daily Thomas, Sara-Maiz A, MD   81 mg at 11/13/24 1057   atorvastatin  (LIPITOR ) tablet 80 mg  80 mg Oral QHS Thomas, Sara-Maiz A, MD   80 mg at 11/13/24 2222   finasteride  (PROSCAR ) tablet 5 mg  5 mg Oral QPM Debby Hitch A, MD   5 mg at 11/13/24 1732   furosemide  (LASIX ) tablet 40 mg   40 mg Oral BID Thomas, Sara-Maiz A, MD   40 mg at 11/13/24 1732   insulin  aspart (novoLOG ) injection 0-5 Units  0-5 Units Subcutaneous QHS Perri DELENA Meliton Mickey., MD       insulin  aspart (novoLOG ) injection 0-9 Units  0-9 Units Subcutaneous TID WC Perri DELENA Meliton Mickey., MD   1 Units at 11/14/24 9370   insulin  glargine (LANTUS ) injection 18 Units  18 Units Subcutaneous QHS Debby Hitch DELENA, MD   18 Units at 11/13/24 2222   losartan  (COZAAR ) tablet 100 mg  100 mg Oral Daily Debby Hitch A, MD   100 mg at 11/13/24 1056   metoprolol  tartrate (LOPRESSOR ) tablet 25 mg  25 mg Oral BID Thomas, Sara-Maiz A, MD   25 mg at 11/13/24 2222   pantoprazole  (PROTONIX ) EC tablet 40 mg  40 mg Oral Daily Thomas, Sara-Maiz A, MD   40 mg at 11/13/24 1056     Discharge Medications: Please see discharge summary for a list of discharge medications.  Relevant Imaging Results:  Relevant Lab Results:   Additional Information SSN: [id number]  Raymond Gordon, LCSWA     "

## 2024-11-14 NOTE — Plan of Care (Signed)
  Problem: Education: Goal: Knowledge of disease or condition will improve Outcome: Progressing Goal: Knowledge of secondary prevention will improve (MUST DOCUMENT ALL) Outcome: Progressing Goal: Knowledge of patient specific risk factors will improve (DELETE if not current risk factor) Outcome: Progressing   Problem: Ischemic Stroke/TIA Tissue Perfusion: Goal: Complications of ischemic stroke/TIA will be minimized Outcome: Progressing

## 2024-11-14 NOTE — Plan of Care (Signed)
" °  Problem: Education: Goal: Knowledge of disease or condition will improve Outcome: Progressing   Problem: Coping: Goal: Will verbalize positive feelings about self Outcome: Progressing   Problem: Self-Care: Goal: Ability to participate in self-care as condition permits will improve Outcome: Progressing   Problem: Education: Goal: Ability to describe self-care measures that may prevent or decrease complications (Diabetes Survival Skills Education) will improve Outcome: Progressing   Problem: Coping: Goal: Ability to adjust to condition or change in health will improve Outcome: Progressing   "

## 2024-11-14 NOTE — TOC Progression Note (Signed)
 Transition of Care [facility]) - Progression Note    Patient Details  Name: Raymond Gordon MRN: [id number] Date of Birth: 01/29/1951  [facility]) CM/SW Contact  Cylas Falzone LITTIE Moose, [place] Phone Number: 11/14/2024, 8:39 AM  Clinical Narrative:    Pt currently Ox4. CSW completed SNF workup at bedside. Pt is agreeable to SNF following hospital discharge and stated he lives alone and has previously been to [facility]. CSW completed Fl2 and sent out SNF referrals.                      Expected Discharge Plan and Services                                               Social Drivers of Health (SDOH) Interventions SDOH Screenings   Food Insecurity: No Food Insecurity (11/13/2024)  Housing: Low Risk (11/13/2024)  Transportation Needs: Unmet Transportation Needs (11/13/2024)  Utilities: Not At Risk (11/13/2024)  Social Connections: Moderately Isolated (11/13/2024)  Tobacco Use: Medium Risk (11/13/2024)    Readmission Risk Interventions     No data to display

## 2024-11-15 DIAGNOSIS — G459 Transient cerebral ischemic attack, unspecified: Secondary | ICD-10-CM | POA: Diagnosis not present

## 2024-11-15 DIAGNOSIS — I480 Paroxysmal atrial fibrillation: Secondary | ICD-10-CM | POA: Diagnosis not present

## 2024-11-15 DIAGNOSIS — R29898 Other symptoms and signs involving the musculoskeletal system: Secondary | ICD-10-CM | POA: Diagnosis not present

## 2024-11-15 DIAGNOSIS — R451 Restlessness and agitation: Secondary | ICD-10-CM

## 2024-11-15 LAB — GLUCOSE, CAPILLARY
Glucose-Capillary: 135 mg/dL — ABNORMAL HIGH (ref 70–99)
Glucose-Capillary: 177 mg/dL — ABNORMAL HIGH (ref 70–99)
Glucose-Capillary: 158 mg/dL — ABNORMAL HIGH (ref 70–99)
Glucose-Capillary: 173 mg/dL — ABNORMAL HIGH (ref 70–99)
Glucose-Capillary: 165 mg/dL — ABNORMAL HIGH (ref 70–99)

## 2024-11-15 LAB — CBC WITH DIFFERENTIAL/PLATELET
Abs Immature Granulocytes: 0.01 K/uL (ref 0.00–0.07)
Basophils Absolute: 0 K/uL (ref 0.0–0.1)
Basophils Relative: 0 %
Eosinophils Absolute: 0.1 K/uL (ref 0.0–0.5)
Eosinophils Relative: 2 %
HCT: 42.5 % (ref 39.0–52.0)
Hemoglobin: 13.6 g/dL (ref 13.0–17.0)
Immature Granulocytes: 0 %
Lymphocytes Relative: 29 %
Lymphs Abs: 1.7 K/uL (ref 0.7–4.0)
MCH: 28.8 pg (ref 26.0–34.0)
MCHC: 32 g/dL (ref 30.0–36.0)
MCV: 89.9 fL (ref 80.0–100.0)
Monocytes Absolute: 0.4 K/uL (ref 0.1–1.0)
Monocytes Relative: 7 %
Neutro Abs: 3.5 K/uL (ref 1.7–7.7)
Neutrophils Relative %: 62 %
Platelets: 241 K/uL (ref 150–400)
RBC: 4.73 MIL/uL (ref 4.22–5.81)
RDW: 16.1 % — ABNORMAL HIGH (ref 11.5–15.5)
WBC: 5.8 K/uL (ref 4.0–10.5)
nRBC: 0 % (ref 0.0–0.2)

## 2024-11-15 LAB — COMPREHENSIVE METABOLIC PANEL WITH GFR
ALT: 27 U/L (ref 0–44)
AST: 24 U/L (ref 15–41)
Albumin: 3.7 g/dL (ref 3.5–5.0)
Alkaline Phosphatase: 105 U/L (ref 38–126)
Anion gap: 12 (ref 5–15)
BUN: 40 mg/dL — ABNORMAL HIGH (ref 8–23)
CO2: 26 mmol/L (ref 22–32)
Calcium: 10.1 mg/dL (ref 8.9–10.3)
Chloride: 106 mmol/L (ref 98–111)
Creatinine, Ser: 1.24 mg/dL (ref 0.61–1.24)
GFR, Estimated: >60 mL/min
Glucose, Bld: 194 mg/dL — ABNORMAL HIGH (ref 70–99)
Potassium: 4 mmol/L (ref 3.5–5.1)
Sodium: 144 mmol/L (ref 135–145)
Total Bilirubin: 0.3 mg/dL (ref 0.0–1.2)
Total Protein: 7.5 g/dL (ref 6.5–8.1)

## 2024-11-15 LAB — MAGNESIUM: Magnesium: 1.7 mg/dL (ref 1.7–2.4)

## 2024-11-15 MED ORDER — STROKE: EARLY STAGES OF RECOVERY BOOK
Status: AC
  Filled 2024-11-15: qty 1

## 2024-11-15 NOTE — Plan of Care (Signed)
  Problem: Education: Goal: Knowledge of disease or condition will improve Outcome: Not Progressing Goal: Knowledge of secondary prevention will improve (MUST DOCUMENT ALL) Outcome: Not Progressing Goal: Knowledge of patient specific risk factors will improve (DELETE if not current risk factor) Outcome: Not Progressing   Problem: Ischemic Stroke/TIA Tissue Perfusion: Goal: Complications of ischemic stroke/TIA will be minimized Outcome: Not Progressing   Problem: Coping: Goal: Will verbalize positive feelings about self Outcome: Not Progressing Goal: Will identify appropriate support needs Outcome: Not Progressing   Problem: Health Behavior/Discharge Planning: Goal: Ability to manage health-related needs will improve Outcome: Not Progressing Goal: Goals will be collaboratively established with patient/family Outcome: Not Progressing   Problem: Self-Care: Goal: Ability to participate in self-care as condition permits will improve Outcome: Not Progressing Goal: Verbalization of feelings and concerns over difficulty with self-care will improve Outcome: Not Progressing Goal: Ability to communicate needs accurately will improve Outcome: Not Progressing   Problem: Nutrition: Goal: Risk of aspiration will decrease Outcome: Not Progressing Goal: Dietary intake will improve Outcome: Not Progressing   Problem: Education: Goal: Ability to describe self-care measures that may prevent or decrease complications (Diabetes Survival Skills Education) will improve Outcome: Not Progressing Goal: Individualized Educational Video(s) Outcome: Not Progressing   Problem: Coping: Goal: Ability to adjust to condition or change in health will improve Outcome: Not Progressing   Problem: Fluid Volume: Goal: Ability to maintain a balanced intake and output will improve Outcome: Not Progressing   Problem: Health Behavior/Discharge Planning: Goal: Ability to identify and utilize available  resources and services will improve Outcome: Not Progressing Goal: Ability to manage health-related needs will improve Outcome: Not Progressing   Problem: Metabolic: Goal: Ability to maintain appropriate glucose levels will improve Outcome: Not Progressing   Problem: Nutritional: Goal: Maintenance of adequate nutrition will improve Outcome: Not Progressing Goal: Progress toward achieving an optimal weight will improve Outcome: Not Progressing   Problem: Skin Integrity: Goal: Risk for impaired skin integrity will decrease Outcome: Not Progressing   Problem: Tissue Perfusion: Goal: Adequacy of tissue perfusion will improve Outcome: Not Progressing   Problem: Education: Goal: Knowledge of General Education information will improve Description: Including pain rating scale, medication(s)/side effects and non-pharmacologic comfort measures Outcome: Not Progressing   Problem: Health Behavior/Discharge Planning: Goal: Ability to manage health-related needs will improve Outcome: Not Progressing   Problem: Clinical Measurements: Goal: Ability to maintain clinical measurements within normal limits will improve Outcome: Not Progressing Goal: Will remain free from infection Outcome: Not Progressing Goal: Diagnostic test results will improve Outcome: Not Progressing Goal: Respiratory complications will improve Outcome: Not Progressing Goal: Cardiovascular complication will be avoided Outcome: Not Progressing   Problem: Activity: Goal: Risk for activity intolerance will decrease Outcome: Not Progressing   Problem: Nutrition: Goal: Adequate nutrition will be maintained Outcome: Not Progressing   Problem: Coping: Goal: Level of anxiety will decrease Outcome: Not Progressing   Problem: Elimination: Goal: Will not experience complications related to bowel motility Outcome: Not Progressing Goal: Will not experience complications related to urinary retention Outcome: Not  Progressing   Problem: Pain Managment: Goal: General experience of comfort will improve and/or be controlled Outcome: Not Progressing   Problem: Safety: Goal: Ability to remain free from injury will improve Outcome: Not Progressing   Problem: Skin Integrity: Goal: Risk for impaired skin integrity will decrease Outcome: Not Progressing

## 2024-11-15 NOTE — Progress Notes (Signed)
 Physical Therapy Treatment Patient Details Name: Raymond Gordon MRN: [id number] DOB: October 03, 1951 Today's Date: 11/15/2024   History of Present Illness Pt is a 73 y/o M who presented to [facility] ED with episode of slurred speech and RFD. MRI brain negative for acute findings. PMHx: DMII, HLD, gout, afib, HTN, CHF, OSA, CKDIII, anemia, dementia, BPH with chronic indwelling foley catheter.    PT Comments  Patient overall requiring heavy min assist for sit to stand transfers due to posterior lean and feet sliding forward. Breaking task down into initial part of leaning torso forward and lifting hips off bed helpful in improving his dynamics to come to stand. Also found pt's shoes in closet and this helped keep his feet from sliding as he stood. Attempted forward steps and side steps with pt unable to advance his feet in either direction. Returned to bed at end of session. Will benefit from a second person to attempt bed to chair or ambulation due to posterior lean and his fear of falling.     If plan is discharge home, recommend the following: A lot of help with bathing/dressing/bathroom;A little help with walking and/or transfers;Assistance with cooking/housework;Assist for transportation;Help with stairs or ramp for entrance   Can travel by private vehicle        Equipment Recommendations  None recommended by PT    Recommendations for Other Services       Precautions / Restrictions Precautions Precautions: Fall Recall of Precautions/Restrictions: Intact Precaution/Restrictions Comments: fearful of falling Restrictions Weight Bearing Restrictions Per Provider Order: No     Mobility  Bed Mobility Overal bed mobility: Needs Assistance Bed Mobility: Supine to Sit, Sit to Supine     Supine to sit: Min assist Sit to supine: Mod assist   General bed mobility comments: incr assist for LE mgmt sit>supine; trendelenburg to scoot up to [facility] with min assist    Transfers Overall transfer level:  Needs assistance Equipment used: Rolling walker (2 wheels) Transfers: Sit to/from Stand Sit to Stand: Min assist, From elevated surface           General transfer comment: initially with bed at lowest height and gripper socks, pt unable to stand x 3 tries due to feet sliding forward; elevated bed with same; replaced RW in front of pt with chair with arms and worked on forward lean and lifting hips off bed with good results and feet not slipping; stood twice to RW, however could not advance feet as he was still feeling feet slip; returned to sit EOB and found pt had shoes in closet; stood twice with shoes and still unable to even side step toward [facility]    Ambulation/Gait               General Gait Details: pt unable to advance feet forward or even sideways   Stairs             Wheelchair Mobility     Tilt Bed    Modified Rankin (Stroke Patients Only)       Balance Overall balance assessment: Needs assistance Sitting-balance support: Feet supported, Bilateral upper extremity supported Sitting balance-Leahy Scale: Fair     Standing balance support: Bilateral upper extremity supported, During functional activity, Reliant on assistive device for balance Standing balance-Leahy Scale: Poor Standing balance comment: reliant on RW, often leaning posteriorly                            Communication  Communication Communication: No apparent difficulties  Cognition Arousal: Alert Behavior During Therapy: Anxious (fearful of falling)   PT - Cognitive impairments: History of cognitive impairments                       PT - Cognition Comments: dementia noted in PMH, Increased time to follow commands, otherwise appropriate Following commands: Impaired Following commands impaired: Follows multi-step commands with increased time    Cueing Cueing Techniques: Verbal cues, Gestural cues  Exercises General Exercises - Lower Extremity Long Arc Quad: AROM,  Both, 10 reps, Seated Hip Flexion/Marching: AROM, Both, 10 reps, Seated    General Comments        Pertinent Vitals/Pain Pain Assessment Pain Assessment: No/denies pain    Home Living                          Prior Function            PT Goals (current goals can now be found in the care plan section) Acute Rehab PT Goals Patient Stated Goal: to return to independent ambulation, improve LLE strength Time For Goal Achievement: 11/27/24 Potential to Achieve Goals: Good Progress towards PT goals: Not progressing toward goals - comment (anxiety and fear of falling)    Frequency    Min 2X/week      PT Plan      Co-evaluation              AM-PAC PT 6 Clicks Mobility   Outcome Measure  Help needed turning from your back to your side while in a flat bed without using bedrails?: A Little Help needed moving from lying on your back to sitting on the side of a flat bed without using bedrails?: A Little Help needed moving to and from a bed to a chair (including a wheelchair)?: Total Help needed standing up from a chair using your arms (e.g., wheelchair or bedside chair)?: A Little Help needed to walk in hospital room?: Total Help needed climbing 3-5 steps with a railing? : Total 6 Click Score: 12    End of Session Equipment Utilized During Treatment: Gait belt Activity Tolerance: Patient tolerated treatment well Patient left: in bed;with call bell/phone within reach;with bed alarm set Nurse Communication: Mobility status PT Visit Diagnosis: Other abnormalities of gait and mobility (R26.89);Muscle weakness (generalized) (M62.81)     Time: 8868-8799 PT Time Calculation (min) (ACUTE ONLY): 29 min  Charges:    $Therapeutic Exercise: 8-22 mins $Therapeutic Activity: 8-22 mins PT General Charges $$ ACUTE PT VISIT: 1 Visit                      Raymond Gordon, PT Acute Rehabilitation Services  Office [phone]    Raymond Gordon 11/15/2024, 12:16  PM

## 2024-11-15 NOTE — Progress Notes (Signed)
" °   11/15/24 1729  Charting Type  Focused Reassessment Changes Noted Neurological  Neurological  Neuro (WDL) X  Orientation Level Disoriented X4  Cognition Unable to follow commands  Speech Mute  R Pupil Size (mm) 3  R Pupil Shape Round  R Pupil Reaction Brisk  L Pupil Size (mm) 3  L Pupil Shape Round  L Pupil Reaction Brisk  Glasgow Coma Scale  Eye Opening 1  Best Verbal Response (NON-intubated) 1  Best Motor Response 5  Glasgow Coma Scale Score 7  NIH Stroke Scale   Dizziness Present No  Headache Present No  Interval Other (Comment)  Level of Consciousness (1a.)    2  LOC Questions (1b. )    2  LOC Commands (1c. )    1  Best Gaze (2. )   0  Visual (3. )   0  Facial Palsy (4. )     1  Motor Arm, Left (5a. )    1  Motor Arm, Right (5b. )  4  Motor Leg, Left (6a. )   4  Motor Leg, Right (6b. )  4  Limb Ataxia (7. ) 0  Sensory (8. )   0  Best Language (9. )   3  Dysarthria (10. ) 2  Extinction/Inattention (11.)    2  Complete NIHSS TOTAL 26  Neurological  Level of Consciousness Unresponsive   Entered patient room to recheck CBG, patient appeared to be sleeping, but then would not respond or open eyes. Was Disoiriented x4 and mute. NIHSS rechecked and significantly increased. Charge RN, Rapid Response RN and MD notified. VSS. CBG 173. Orders for CMP, CBC and delirium precautions placed.  "

## 2024-11-15 NOTE — TOC Progression Note (Signed)
 Transition of Care [facility]) - Progression Note    Patient Details  Name: Raymond Gordon MRN: [id number] Date of Birth: Dec 06, 1950  Transition of Care [facility]) CM/SW Contact  [name], [name] Phone Number: 11/15/2024, 11:09 AM  Clinical Narrative:   CSW met with patient to provide bed offers, patient chose [facility]. CSW confirmed availability with [facility]. CSW discussed with patient about concern regarding whether PTAR would be covered, patient said he transferred via ambulance last time and would like to try. CSW contacted [employer] to request authorization for SNF and PTAR, authorization pending. CSW to follow.    Expected Discharge Plan: Skilled Nursing Facility Barriers to Discharge: Insurance Authorization               Expected Discharge Plan and Services                                               Social Drivers of Health (SDOH) Interventions SDOH Screenings   Food Insecurity: No Food Insecurity (11/13/2024)  Housing: Low Risk (11/13/2024)  Transportation Needs: Unmet Transportation Needs (11/13/2024)  Utilities: Not At Risk (11/13/2024)  Social Connections: Moderately Isolated (11/13/2024)  Tobacco Use: Medium Risk (11/13/2024)    Readmission Risk Interventions     No data to display

## 2024-11-15 NOTE — Plan of Care (Signed)
  Problem: Education: Goal: Knowledge of disease or condition will improve Outcome: Progressing Goal: Knowledge of secondary prevention will improve (MUST DOCUMENT ALL) Outcome: Progressing Goal: Knowledge of patient specific risk factors will improve (DELETE if not current risk factor) Outcome: Progressing   Problem: Ischemic Stroke/TIA Tissue Perfusion: Goal: Complications of ischemic stroke/TIA will be minimized Outcome: Progressing   Problem: Self-Care: Goal: Ability to participate in self-care as condition permits will improve Outcome: Progressing   Problem: Nutrition: Goal: Risk of aspiration will decrease Outcome: Progressing

## 2024-11-15 NOTE — Progress Notes (Addendum)
 Called by called by RN that patient was restless, confused.  This morning patient was fine, alert oriented x 3, followed commands.  He had a similar presentation last night where he became confused. Lab work and vitals reviewed. He is afebrile UA was checked 3 days ago which was unremarkable Chest x-ray on 11/12/2024 was unremarkable He has been getting Lasix , Cozaar   On exam Appears restless, fidgety in both upper and lower extremities Lungs are clear to auscultation bilaterally Abdomen is soft  Plan ?  Delirium Bladder scan obtained, no urinary retention noted Check CBC and a CMP with magnesium  level stat Follow-up on electrolytes Bed alarm activated Mittens in place Fall risk precautions Delirium precautions Will follow  Critical care time spent 35 minutes

## 2024-11-15 NOTE — Progress Notes (Signed)
 [facility]  PROGRESS NOTE  Raymond Gordon FMW:[id number] DOB: [birth year] DOA: 11/12/2024 PCP: [name], Richard W, MD   Brief HPI:    73 y.o. male with medical history significant of DMII, Gout, afib, HTN, OSA, Gout, CKD IIIa, Anemia of chronic disease, Dementia, BPH chronic indwelling foley, HLD, Hfpef.    He was found by family, unable to get up from bathroom.  L leg weakness and L arm weakness noted.        Assessment/Plan:   TIA Stroke Like Symptoms -Presented with episode of slurred speech and right facial droop - Seen by neurology, suspected left leg weakness is due to small stroke few months ago -MRI brain without any acute abnormality, chronic ischemic changes, remote lacunar infarcts -CTA head and neck stable from 11/25, bulky calcified plaque about L carotid bulb/proximal L ICA with severe 80% stenosis by NASCET criteria - occluded L vertebral artery continue with eliquis  per neurology PT/OT/SLP Needs outpatient neurology follow up UA not concerning for UTI. Follow b12, folate, TSH.   Left lower extremity weakness  neurology suspects the L leg weakness is related to a small stroke a few months ago  MRI of C/T/L spine without findings that look like they're related, neurology notes MRI spine does not convincingly explain L leg weakness per neuro will need out patient EMG/NCS PT/OT   Degenerative Disc Disease Mild to moderate Diffuse Spinal Stenosis at C2-3 through C6-7 Disc Bulge with Facet Hypertrophy at L4-5 with L worse than R Lateral Recess Stenosis with moderate L worse than R L4 Foraminal Narrowing  Disc Protrusion at L3-4  Bilateral Facet Hypertrophy at L3-4 through L5-S1 -consider ortho/spine consult referral outpatient -pt/ot    Mild hyperkalemia Resolved   HFpEF Euvolemic on exam metoprolol /cozaar /lasix     DMII SSI lantus  18 units CBG well-controlled   Gout allopurinol     P Afib continue on eliquis  currently in sinus    HTN Losartan ,  metoprolol     OSA cpap at bedtime    CKDIIIa -at baseline    Dementia Alert and oriented today, not sure where admitting doc got hx dementia, sister doesn't know this (he lives alone -  Delirium precautions   BPH  -continue finasteride     HLD -continue statin       DVT prophylaxis: Apixaban   Medications      stroke: early stages of recovery book       allopurinol   300 mg Oral q AM   apixaban   5 mg Oral BID   aspirin   81 mg Oral Daily   atorvastatin   80 mg Oral QHS   finasteride   5 mg Oral QPM   furosemide   40 mg Oral BID   insulin  aspart  0-5 Units Subcutaneous QHS   insulin  aspart  0-9 Units Subcutaneous TID WC   insulin  glargine  18 Units Subcutaneous QHS   losartan   100 mg Oral Daily   metoprolol  tartrate  25 mg Oral BID   pantoprazole   40 mg Oral Daily     Data Reviewed:   CBG:  Recent Labs  Lab 11/14/24 0607 11/14/24 1244 11/14/24 1602 11/14/24 2156 11/15/24 0616  GLUCAP 147* 186* 190* 148* 135*    SpO2: 100 %    Vitals:   11/14/24 2011 11/14/24 2345 11/15/24 0345 11/15/24 0738  BP: (!) 157/51 (!) 132/54 (!) 158/54 (!) 149/44  Pulse: 60 (!) 55 (!) 52 (!) 51  Resp:    18  Temp: 98.2 F (36.8 C) 97.9 F (36.6 C) 98.3  F (36.8 C) 97.7 F (36.5 C)  TempSrc: Oral Oral Oral Oral  SpO2: 99% 92% 100% 100%  Height:          Data Reviewed:  Basic Metabolic Panel: Recent Labs  Lab 11/12/24 1858 11/13/24 0627  NA 138 141  K 5.5* 4.2  CL 103 105  CO2 22 22  GLUCOSE 161* 141*  BUN 42* 35*  CREATININE 1.14 0.86  CALCIUM  10.5* 10.2    CBC: Recent Labs  Lab 11/12/24 1858  WBC 5.5  NEUTROABS 3.9  HGB 13.2  HCT 41.6  MCV 91.4  PLT 213    LFT Recent Labs  Lab 11/12/24 1858  AST 51*  ALT 42  ALKPHOS 103  BILITOT 0.3  PROT 8.0  ALBUMIN 3.7     Antibiotics: Anti-infectives (From admission, onward)    None        CONSULTS   Code Status: Full code  Family Communication: Discussed with patient's sisters at  bedside; plan to go to skilled nursing facility once bed becomes available     Subjective   Patient seen and examined, denies any complaints   Objective    Physical Examination:  Appears in no acute distress S1-S2, regular, no murmur auscultated Abdomen is soft, nontender, no organomegaly Extremities no edema            Lynnzie Blackson S Jc Veron   [facility] If 7PM-7AM, please contact night-coverage at [email or web address], Office  [phone]   11/15/2024, 10:06 AM  LOS: 1 day

## 2024-11-16 ENCOUNTER — Inpatient Hospital Stay (HOSPITAL_COMMUNITY)

## 2024-11-16 ENCOUNTER — Other Ambulatory Visit (HOSPITAL_COMMUNITY): Payer: Self-pay

## 2024-11-16 DIAGNOSIS — G9340 Encephalopathy, unspecified: Secondary | ICD-10-CM

## 2024-11-16 DIAGNOSIS — G9341 Metabolic encephalopathy: Secondary | ICD-10-CM | POA: Diagnosis present

## 2024-11-16 DIAGNOSIS — R29898 Other symptoms and signs involving the musculoskeletal system: Secondary | ICD-10-CM | POA: Diagnosis not present

## 2024-11-16 DIAGNOSIS — R410 Disorientation, unspecified: Secondary | ICD-10-CM | POA: Diagnosis not present

## 2024-11-16 DIAGNOSIS — Z8249 Family history of ischemic heart disease and other diseases of the circulatory system: Secondary | ICD-10-CM | POA: Diagnosis not present

## 2024-11-16 DIAGNOSIS — I130 Hypertensive heart and chronic kidney disease with heart failure and stage 1 through stage 4 chronic kidney disease, or unspecified chronic kidney disease: Secondary | ICD-10-CM | POA: Diagnosis present

## 2024-11-16 DIAGNOSIS — Z7982 Long term (current) use of aspirin: Secondary | ICD-10-CM | POA: Diagnosis not present

## 2024-11-16 DIAGNOSIS — N400 Enlarged prostate without lower urinary tract symptoms: Secondary | ICD-10-CM | POA: Diagnosis present

## 2024-11-16 DIAGNOSIS — E1122 Type 2 diabetes mellitus with diabetic chronic kidney disease: Secondary | ICD-10-CM | POA: Diagnosis present

## 2024-11-16 DIAGNOSIS — R569 Unspecified convulsions: Secondary | ICD-10-CM

## 2024-11-16 DIAGNOSIS — I5032 Chronic diastolic (congestive) heart failure: Secondary | ICD-10-CM | POA: Diagnosis present

## 2024-11-16 DIAGNOSIS — N179 Acute kidney failure, unspecified: Secondary | ICD-10-CM | POA: Diagnosis present

## 2024-11-16 DIAGNOSIS — I480 Paroxysmal atrial fibrillation: Secondary | ICD-10-CM | POA: Diagnosis present

## 2024-11-16 DIAGNOSIS — I69892 Facial weakness following other cerebrovascular disease: Secondary | ICD-10-CM | POA: Diagnosis not present

## 2024-11-16 DIAGNOSIS — E66812 Obesity, class 2: Secondary | ICD-10-CM | POA: Diagnosis present

## 2024-11-16 DIAGNOSIS — Z833 Family history of diabetes mellitus: Secondary | ICD-10-CM | POA: Diagnosis not present

## 2024-11-16 DIAGNOSIS — G458 Other transient cerebral ischemic attacks and related syndromes: Secondary | ICD-10-CM | POA: Diagnosis present

## 2024-11-16 DIAGNOSIS — G459 Transient cerebral ischemic attack, unspecified: Secondary | ICD-10-CM | POA: Diagnosis present

## 2024-11-16 DIAGNOSIS — F05 Delirium due to known physiological condition: Secondary | ICD-10-CM | POA: Diagnosis present

## 2024-11-16 DIAGNOSIS — I69828 Other speech and language deficits following other cerebrovascular disease: Secondary | ICD-10-CM | POA: Diagnosis not present

## 2024-11-16 DIAGNOSIS — Z79899 Other long term (current) drug therapy: Secondary | ICD-10-CM | POA: Diagnosis not present

## 2024-11-16 DIAGNOSIS — F0390 Unspecified dementia without behavioral disturbance: Secondary | ICD-10-CM | POA: Diagnosis present

## 2024-11-16 DIAGNOSIS — N1831 Chronic kidney disease, stage 3a: Secondary | ICD-10-CM | POA: Diagnosis present

## 2024-11-16 DIAGNOSIS — M109 Gout, unspecified: Secondary | ICD-10-CM | POA: Diagnosis present

## 2024-11-16 DIAGNOSIS — Z7901 Long term (current) use of anticoagulants: Secondary | ICD-10-CM | POA: Diagnosis not present

## 2024-11-16 DIAGNOSIS — G4733 Obstructive sleep apnea (adult) (pediatric): Secondary | ICD-10-CM | POA: Diagnosis present

## 2024-11-16 DIAGNOSIS — E785 Hyperlipidemia, unspecified: Secondary | ICD-10-CM | POA: Diagnosis present

## 2024-11-16 DIAGNOSIS — E875 Hyperkalemia: Secondary | ICD-10-CM | POA: Diagnosis present

## 2024-11-16 DIAGNOSIS — D631 Anemia in chronic kidney disease: Secondary | ICD-10-CM | POA: Diagnosis present

## 2024-11-16 LAB — BLOOD GAS, VENOUS
Acid-Base Excess: 3.2 mmol/L — ABNORMAL HIGH (ref 0.0–2.0)
Bicarbonate: 29.1 mmol/L — ABNORMAL HIGH (ref 20.0–28.0)
Drawn by: 4956
O2 Saturation: 65.8 %
Patient temperature: 36.5
pCO2, Ven: 47 mmHg (ref 44–60)
pH, Ven: 7.4 (ref 7.25–7.43)
pO2, Ven: 39 mmHg (ref 32–45)

## 2024-11-16 LAB — GLUCOSE, CAPILLARY
Glucose-Capillary: 120 mg/dL — ABNORMAL HIGH (ref 70–99)
Glucose-Capillary: 184 mg/dL — ABNORMAL HIGH (ref 70–99)
Glucose-Capillary: 133 mg/dL — ABNORMAL HIGH (ref 70–99)
Glucose-Capillary: 220 mg/dL — ABNORMAL HIGH (ref 70–99)

## 2024-11-16 MED ORDER — THIAMINE MONONITRATE 100 MG PO TABS
100.0000 mg | ORAL_TABLET | Freq: Every day | ORAL | Status: DC
  Administered 2024-11-16 – 2024-11-21 (×6): 100 mg via ORAL
  Filled 2024-11-16 (×6): qty 1

## 2024-11-16 MED ORDER — MAGNESIUM SULFATE IN D5W 1-5 GM/100ML-% IV SOLN
1.0000 g | Freq: Once | INTRAVENOUS | Status: AC
  Administered 2024-11-16: 1 g via INTRAVENOUS
  Filled 2024-11-16: qty 100

## 2024-11-16 MED ORDER — TRAZODONE HCL 100 MG PO TABS
100.0000 mg | ORAL_TABLET | Freq: Every day | ORAL | Status: DC
  Administered 2024-11-16: 100 mg via ORAL
  Filled 2024-11-16: qty 1

## 2024-11-16 MED ORDER — THIAMINE MONONITRATE 100 MG PO TABS: 100.0000 mg | ORAL_TABLET | Freq: Every day | ORAL | Status: DC

## 2024-11-16 NOTE — TOC Progression Note (Signed)
 Transition of Care [facility]) - Progression Note    Patient Details  Name: Raymond Gordon MRN: [id number] Date of Birth: 08/11/1951  Transition of Care [facility]) CM/SW Contact  [name], [name] Phone Number: 11/16/2024, 10:03 AM  Clinical Narrative:   CSW contacted by [facility] [employer] of offer of peer to peer review for SNF authorization. Details sent to MD, due by 10 am tomorrow. Authorization for PTAR still pending review. CSW to follow.    Expected Discharge Plan: Skilled Nursing Facility Barriers to Discharge: Insurance Authorization               Expected Discharge Plan and Services                                               Social Drivers of Health (SDOH) Interventions SDOH Screenings   Food Insecurity: No Food Insecurity (11/13/2024)  Housing: Low Risk (11/13/2024)  Transportation Needs: Unmet Transportation Needs (11/13/2024)  Utilities: Not At Risk (11/13/2024)  Social Connections: Moderately Isolated (11/13/2024)  Tobacco Use: Medium Risk (11/13/2024)    Readmission Risk Interventions     No data to display

## 2024-11-16 NOTE — Progress Notes (Signed)
 Occupational Therapy Treatment Patient Details Name: Raymond Gordon MRN: [id number] DOB: [birth year] Today's Date: 11/16/2024   History of present illness Pt is a 73 y/o M who presented to [facility] ED with episode of slurred speech and RFD. MRI brain negative for acute findings. PMHx: DMII, HLD, gout, afib, HTN, CHF, OSA, CKDIII, anemia, dementia, BPH with chronic indwelling foley catheter.   OT comments  Patient received in supine and eager to participate in OT/PT treatment. Patient demonstrating good gains this treatment session with min assist to get to EOB. Performed LB dressing seated on EOB with assistance to thread legs into clothing and assistance to pull up with patient able to slip on shoes. Patient ambulated to sink and was able to perform grooming tasks standing at sink.  Patient required seated rest break before performing mobility in hallway.  Patient will benefit from continued inpatient follow up therapy, <3 hours/day.  Acute OT to continue to follow to address established goals to facilitate DC to next venue of care.        If plan is discharge home, recommend the following:  Assistance with cooking/housework;Direct supervision/assist for medications management;Direct supervision/assist for financial management;Assist for transportation;Help with stairs or ramp for entrance;A little help with walking and/or transfers;A lot of help with bathing/dressing/bathroom   Equipment Recommendations  Other (comment) (defer)    Recommendations for Other Services      Precautions / Restrictions Precautions Precautions: Fall Recall of Precautions/Restrictions: Intact Precaution/Restrictions Comments: fearful of falling Restrictions Weight Bearing Restrictions Per Provider Order: No       Mobility Bed Mobility Overal bed mobility: Needs Assistance Bed Mobility: Supine to Sit     Supine to sit: Min assist     General bed mobility comments: increased time and assistance with LEs     Transfers Overall transfer level: Needs assistance Equipment used: Rolling walker (2 wheels) Transfers: Sit to/from Stand Sit to Stand: Min assist, From elevated surface           General transfer comment: min assist +2 to stand initially for safety from EOB, able to stand from recliner with assist of one and cues for hand placement     Balance Overall balance assessment: Needs assistance Sitting-balance support: Feet supported, Bilateral upper extremity supported Sitting balance-Leahy Scale: Fair Sitting balance - Comments: no LOB seated on EOB   Standing balance support: Single extremity supported, Bilateral upper extremity supported, During functional activity Standing balance-Leahy Scale: Poor Standing balance comment: reliant on external support for balance                           ADL either performed or assessed with clinical judgement   ADL Overall ADL's : Needs assistance/impaired     Grooming: Wash/dry hands;Wash/dry face;Oral care;Contact guard assist;Standing Grooming Details (indicate cue type and reason): at sink         Upper Body Dressing : Minimal assistance;Sitting   Lower Body Dressing: Moderate assistance;Sit to/from stand Lower Body Dressing Details (indicate cue type and reason): assistance to thread legs into underwear and assistance to pull up while standing on left side                    Extremity/Trunk Assessment              Vision       Perception     Praxis     Communication Communication Communication: No apparent difficulties   Cognition Arousal:  Alert Behavior During Therapy: Flat affect Cognition: No apparent impairments             OT - Cognition Comments: decreased signs of fear of falling on this session                 Following commands: Impaired Following commands impaired: Follows multi-step commands with increased time      Cueing   Cueing Techniques: Verbal cues, Gestural  cues  Exercises      Shoulder Instructions       General Comments Patient's sister present and supportive    Pertinent Vitals/ Pain       Pain Assessment Pain Assessment: No/denies pain  Home Living                                          Prior Functioning/Environment              Frequency  Min 2X/week        Progress Toward Goals  OT Goals(current goals can now be found in the care plan section)  Progress towards OT goals: Progressing toward goals  Acute Rehab OT Goals Patient Stated Goal: to get better OT Goal Formulation: With patient/family Time For Goal Achievement: 11/27/24 Potential to Achieve Goals: Good ADL Goals Pt Will Perform Grooming: with contact guard assist;standing Pt Will Perform Lower Body Dressing: with contact guard assist;with adaptive equipment;sitting/lateral leans;sit to/from stand Pt Will Transfer to Toilet: with contact guard assist;bedside commode Pt Will Perform Toileting - Clothing Manipulation and hygiene: with contact guard assist;sitting/lateral leans;sit to/from stand Additional ADL Goal #1: Pt will verbalize 3 falls prevention strategies during ADLs and functional mobility to improve overall safety.  Plan      Co-evaluation    PT/OT/SLP Co-Evaluation/Treatment: Yes Reason for Co-Treatment: For patient/therapist safety;To address functional/ADL transfers   OT goals addressed during session: ADL's and self-care      AM-PAC OT 6 Clicks Daily Activity     Outcome Measure   Help from another person eating meals?: None Help from another person taking care of personal grooming?: A Little Help from another person toileting, which includes using toliet, bedpan, or urinal?: Total Help from another person bathing (including washing, rinsing, drying)?: A Lot Help from another person to put on and taking off regular upper body clothing?: A Little Help from another person to put on and taking off regular  lower body clothing?: A Lot 6 Click Score: 15    End of Session Equipment Utilized During Treatment: Gait belt;Rolling walker (2 wheels)  OT Visit Diagnosis: Unsteadiness on feet (R26.81);History of falling (Z91.81);Muscle weakness (generalized) (M62.81)   Activity Tolerance Patient tolerated treatment well   Patient Left in chair;with call bell/phone within reach;with chair alarm set;with family/visitor present   Nurse Communication Mobility status        Time: 9066-8997 OT Time Calculation (min): 29 min  Charges: OT General Charges $OT Visit: 1 Visit OT Treatments $Self Care/Home Management : 8-22 mins  Dick Laine, OTA Acute Rehabilitation Services  Office [phone]   Jeb LITTIE Laine 11/16/2024, 12:54 PM

## 2024-11-16 NOTE — Progress Notes (Signed)
 EEG complete - results pending

## 2024-11-16 NOTE — Progress Notes (Signed)
 Physical Therapy Treatment Patient Details Name: Raymond Gordon MRN: [id number] DOB: 1951-10-08 Today's Date: 11/16/2024   History of Present Illness Pt is a 73 y/o M who presented to [facility] ED with episode of slurred speech and RFD. MRI brain negative for acute findings. PMHx: DMII, HLD, gout, afib, HTN, CHF, OSA, CKDIII, anemia, dementia, BPH with chronic indwelling foley catheter.    PT Comments  Pt seen for co-treatment with OT - received semi-reclined in bed with sister at bedside. Pt able to transition to sitting EOB with CGA using bed features and ++ time. Pt performed all transfers with RW and min A +2 progressing to min A +1 and cues for hand placement. Pt ambulated 74ft with RW and min A +2 to sink and stood to wash face, took seated rest break, then ambulated additional 56ft x 1 and 17ft x 1 with RW and min A (+2 for recliner follow). Pt required mod cues to increased step length (R>L) but demo poor carry over with cues. Continue to recommend therapy <3 hours/day to address current impairments. Acute PT to cont to follow.    If plan is discharge home, recommend the following: A lot of help with bathing/dressing/bathroom;Assistance with cooking/housework;Assist for transportation;Help with stairs or ramp for entrance;A little help with walking and/or transfers;Supervision due to cognitive status;Direct supervision/assist for medications management   Can travel by private vehicle     Yes  Equipment Recommendations  None recommended by PT    Recommendations for Other Services       Precautions / Restrictions Precautions Precautions: Fall Recall of Precautions/Restrictions: Intact Precaution/Restrictions Comments: fearful of falling Restrictions Weight Bearing Restrictions Per Provider Order: No     Mobility  Bed Mobility Overal bed mobility: Needs Assistance Bed Mobility: Supine to Sit     Supine to sit: Contact guard, HOB elevated, Used rails     General bed mobility  comments: ++ time and CGA for safety Patient Response: Cooperative  Transfers Overall transfer level: Needs assistance Equipment used: Rolling walker (2 wheels) Transfers: Sit to/from Stand Sit to Stand: Min assist, From elevated surface           General transfer comment: min assist +2 to stand initially for safety from EOB, able to stand from recliner with min A of 1 and cues for hand placement    Ambulation/Gait Ambulation/Gait assistance: Min assist, +2 safety/equipment Gait Distance (Feet): 30 Feet (48ft + 81ft) Assistive device: Rolling walker (2 wheels) Gait Pattern/deviations: Step-to pattern, Staggering right, Decreased step length - right, Decreased step length - left, Decreased stride length, Shuffle, Trunk flexed, Narrow base of support Gait velocity: decreased Gait velocity interpretation: <1.31 ft/sec, indicative of household ambulator   General Gait Details: decreased cadence, short, shuffling steps, flexed trunk/downward gaze, narrow BOS, and decreased foot clearance   Stairs             Wheelchair Mobility     Tilt Bed Tilt Bed Patient Response: Cooperative  Modified Rankin (Stroke Patients Only)       Balance Overall balance assessment: Needs assistance Sitting-balance support: Feet supported, Bilateral upper extremity supported Sitting balance-Leahy Scale: Fair Sitting balance - Comments: able to maintain static sitting balance with supervision   Standing balance support: Bilateral upper extremity supported, During functional activity, Reliant on assistive device for balance (RW) Standing balance-Leahy Scale: Poor Standing balance comment: reliant on external support from therapist and on RW for balance  Communication Communication Communication: No apparent difficulties  Cognition Arousal: Alert Behavior During Therapy: Flat affect   PT - Cognitive impairments: History of cognitive impairments                        PT - Cognition Comments: dementia noted in PMH, Increased time to follow commands, otherwise appropriate Following commands: Impaired Following commands impaired: Follows multi-step commands with increased time    Cueing Cueing Techniques: Verbal cues, Gestural cues  Exercises      General Comments General comments (skin integrity, edema, etc.): Pt's sister present and supportive throughout      Pertinent Vitals/Pain Pain Assessment Pain Assessment: No/denies pain    Home Living                          Prior Function            PT Goals (current goals can now be found in the care plan section) Acute Rehab PT Goals Patient Stated Goal: to return to independent ambulation, improve LLE strength PT Goal Formulation: With patient Time For Goal Achievement: 11/27/24 Potential to Achieve Goals: Good Progress towards PT goals: Progressing toward goals    Frequency    Min 2X/week      PT Plan      Co-evaluation PT/OT/SLP Co-Evaluation/Treatment: Yes Reason for Co-Treatment: For patient/therapist safety;To address functional/ADL transfers PT goals addressed during session: Mobility/safety with mobility;Balance;Proper use of DME;Strengthening/ROM OT goals addressed during session: ADL's and self-care      AM-PAC PT 6 Clicks Mobility   Outcome Measure  Help needed turning from your back to your side while in a flat bed without using bedrails?: A Little Help needed moving from lying on your back to sitting on the side of a flat bed without using bedrails?: A Little Help needed moving to and from a bed to a chair (including a wheelchair)?: A Little Help needed standing up from a chair using your arms (e.g., wheelchair or bedside chair)?: A Little Help needed to walk in hospital room?: A Lot Help needed climbing 3-5 steps with a railing? : A Lot 6 Click Score: 16    End of Session Equipment Utilized During Treatment: Gait  belt Activity Tolerance: Patient tolerated treatment well Patient left: with call bell/phone within reach;in chair;with chair alarm set;with family/visitor present Nurse Communication: Mobility status PT Visit Diagnosis: Other abnormalities of gait and mobility (R26.89);Muscle weakness (generalized) (M62.81);Unsteadiness on feet (R26.81)     Time: 9066-8997 PT Time Calculation (min) (ACUTE ONLY): 29 min  Charges:    $Therapeutic Activity: 8-22 mins PT General Charges $$ ACUTE PT VISIT: 1 Visit                     Therisa Stains PT, DPT Therisa HERO Zaunegger 11/16/2024, 1:17 PM

## 2024-11-16 NOTE — Procedures (Signed)
 Patient Name: Raymond Gordon  MRN: [id number]  Epilepsy Attending: Arlin MALVA Krebs  Referring Physician/Provider: Remi Pippin, NP  Date: 11/16/2024 Duration: 23.07 mins  Patient history: 73yo M with episode of slurred speech and right facial droop. EEG to evaluate for seizure  Level of alertness: Awake  AEDs during EEG study: None  Technical aspects: This EEG study was done with scalp electrodes positioned according to the 10-20 International system of electrode placement. Electrical activity was reviewed with band pass filter of 1-70Hz , sensitivity of 7 uV/mm, display speed of 16mm/sec with a 60Hz  notched filter applied as appropriate. EEG data were recorded continuously and digitally stored.  Video monitoring was available and reviewed as appropriate.  Description: The posterior dominant rhythm consists of 8-9 Hz activity of moderate voltage (25-35 uV) seen predominantly in posterior head regions, symmetric and reactive to eye opening and eye closing. EEG showed intermittent generalized 3 to 6 Hz theta-delta slowing. Hyperventilation and photic stimulation were not performed.     ABNORMALITY - Intermittent slow, generalized  IMPRESSION: This study is suggestive of generalized cerebral dysfunction ( encephalopathy). No seizures or epileptiform discharges were seen throughout the recording.  [name] O [name]

## 2024-11-16 NOTE — Plan of Care (Signed)

## 2024-11-16 NOTE — Progress Notes (Signed)
 Patient refused CPAP for the night

## 2024-11-16 NOTE — Progress Notes (Addendum)
 NEUROLOGY CONSULT FOLLOW UP NOTE   Date of service: November 16, 2024 Patient Name: Raymond Gordon MRN:  [id number] DOB:  04-24-1951  Interval Hx/subjective  Seen in room sitting up in the chair. Able to answer simple orientation questions but starts having garbled speech when responses require sentences.   Vitals   Vitals:   11/16/24 0431 11/16/24 0835 11/16/24 0859 11/16/24 1128  BP: (!) 144/53 (!) 153/52  (!) 111/48  Pulse: (!) 54 (!) 52 (!) 58 (!) 51  Resp: 17 17  16   Temp: 98.2 F (36.8 C) 97.6 F (36.4 C)  97.7 F (36.5 C)  TempSrc: Oral Oral  Oral  SpO2: 98% 97%  98%  Weight:      Height:         Body mass index is 39.32 kg/m.  Physical Exam   Constitutional: Appears well-developed and well-nourished.  Psych: Affect appropriate to situation.  Eyes: No scleral injection.  HENT: No OP obstrucion.  Head: Normocephalic.  Cardiovascular: Normal rate and regular rhythm.  Respiratory: Effort normal, non-labored breathing.  GI: Soft.  No distension. There is no tenderness.  Skin: WDI.   Neurologic Examination   Mental Status: Patient is awake, alert, oriented to self, date, place.  Unable to elaborate on past medical history.  Speech is dysarthric (baseline) but is more garbled. Requires repeated cues to maintain good attention  Cranial Nerves: II: Visual Fields are full. Pupils are equal, round, and reactive to light.   III,IV, VI: EOMI without ptosis or diplopia.  V: Facial sensation is symmetric to temperature VII: Facial movement is symmetric resting and smiling VIII: Hearing is intact to voice X: Palate elevates symmetrically XI: Shoulder shrug is symmetric. XII: Tongue protrudes midline without atrophy or fasciculations.  Motor: Tone is normal. Bulk is normal.  Bilateral upper extremities with good strength Difficulty elevating lower extremities while sitting in the chair Right lower extremity 4/5 at the hip Left lower extremity 3+/5 at the  hip Sensory: Sensation is symmetric to light touch in the arms and legs.  Cerebellar: FNF intact Unable to do HKS or tap feet  Medications Current Medications[1]  Labs and Diagnostic Imaging   CBC:  Recent Labs  Lab 11/12/24 1858 11/15/24 1945  WBC 5.5 5.8  NEUTROABS 3.9 3.5  HGB 13.2 13.6  HCT 41.6 42.5  MCV 91.4 89.9  PLT 213 241    Basic Metabolic Panel:  Lab Results  Component Value Date   NA 144 11/15/2024   K 4.0 11/15/2024   CO2 26 11/15/2024   GLUCOSE 194 (H) 11/15/2024   BUN 40 (H) 11/15/2024   CREATININE 1.24 11/15/2024   CALCIUM  10.1 11/15/2024   GFRNONAA >60 11/15/2024   GFRAA >60 08/21/2015   Lipid Panel:  Lab Results  Component Value Date   LDLCALC 76 11/13/2024   HgbA1c:  Lab Results  Component Value Date   HGBA1C 9.2 (H) 11/13/2024   Urine Drug Screen:     Component Value Date/Time   LABOPIA NEGATIVE 11/12/2024 2010   COCAINSCRNUR NEGATIVE 11/12/2024 2010   LABBENZ NEGATIVE 11/12/2024 2010   AMPHETMU NEGATIVE 11/12/2024 2010   THCU NEGATIVE 11/12/2024 2010   LABBARB NEGATIVE 11/12/2024 2010    Alcohol  Level     Component Value Date/Time   [facility] <15 11/12/2024 1858   INR  Lab Results  Component Value Date   INR 1.2 11/12/2024   APTT  Lab Results  Component Value Date   APTT 40 (H) 11/12/2024   Lab  Results  Component Value Date   VITAMINB12 905 11/13/2024   FOLATE 13.3 11/13/2024   TSH 3.400 11/13/2024     CT Head without contrast: CTH was negative for a large hypodensity concerning for a large territory infarct or hyperdensity concerning for an ICH   CT angio Head and Neck with contrast: 1. Stable CTA as compared to 10/18/2024. No acute large vessel occlusion or other emergent finding. 2. Bulky calcified plaque about the left carotid bulb/proximal left ICA with severe 80% stenosis by NASCET criteria. 3. Occluded left vertebral artery. 4. Aortic Atherosclerosis (ICD10-I70.0).   MRI Brain: No acute strokes. No  significant abnormalities. Moderately advanced confluent periventricular and scattered subcortical T2 hyperintensities, stable from the prior study. This most likely reflects the sequelae of chronic microvascular ischemia. Chronic ischemic changes in the thalami bilaterally. Remote lacunar infarcts in the right superior cerebellum.  Assessment  Raymond Gordon is a 73 y.o. male with a PMHx of atrial fibrillation on Eliquis , HTN, HLD and DM2, who was unable to get up from the toilet due to left leg weakness. Reported that this had possibly been going on for a few months. He had a couple of falls in the past few months. Had foley cath that was just recently taken out due to BPH. Has bsaeline constipation. Denies any saddle anesthesia but reported that sensation in the legs had been off due to lymphedema and unreliable reflexes in BL lower extremities secondary to impaired relaxation and lymphedema. He uses a walker at baseline. Neurology was consulted. MRI brain showed no acute abnormalities. Overall impression was that the noted L leg weakness was most likely secondary to a small stroke a few months ago when he initially noted the weakness. He also has moderately advanced T2 hyperintensities on MRI, consistent with chronic microvascular ischemia. Notably, he was seen in the ED in November and was diagnosed with a TIA. He stated that he was feeling slightly off. It was noted that his sisters manage his medications and do occasionally find pills on the floor, suggesting semicompliance. Neurology has been reconsulted today (12/23) for sundowning.  - Exam today reveals the patient to be awake, alert, oriented to self, date, place. He is unable to elaborate on past medical history. Speech is dysarthric (baseline) but is more garbled per family. Requires repeated cues to maintain good attention. Bilateral upper extremities with good strength. Difficulty elevating lower extremities while sitting in the chair. Right  lower extremity 4/5 at the hip Left lower extremity 3+/5 at the hip. Sensation is symmetric to light touch in the arms and legs. - Impression: Sundowning due to mild hospital delirium and disruption of his normal diurnal cycle.   Recommendations  - Outpatient neurology follow up for dementia work up - Continue thiamine  replacement  - EEG - VBG - Recommend CPAP at night and for naps - Avoid deliriogenic medications.  - Trazodone  for sleep QHS  Addendum: - EEG: Intermittent slow, generalized. This study is suggestive of generalized cerebral dysfunction ( encephalopathy). No seizures or epileptiform discharges were seen throughout the recording. - No change to above recommendations.  - Neurohospitalist service will sign off. Pleae call if there are additional questions.  ______________________________________________________________________   Bonney Jorene Last, NP [facility]  Electronically signed: Dr. Lorrayne Ismael      [1]  Current Facility-Administered Medications:    acetaminophen  (TYLENOL ) tablet 650 mg, 650 mg, Oral, Q6H PRN, Debby Camila LABOR, MD   allopurinol  (ZYLOPRIM ) tablet 300 mg, 300 mg, Oral, q AM,  Debby Hitch A, MD, 300 mg at 11/16/24 9141   apixaban  (ELIQUIS ) tablet 5 mg, 5 mg, Oral, BID, Debby Hitch A, MD, 5 mg at 11/16/24 9141   aspirin  chewable tablet 81 mg, 81 mg, Oral, Daily, Debby Hitch A, MD, 81 mg at 11/16/24 9140   atorvastatin  (LIPITOR ) tablet 80 mg, 80 mg, Oral, QHS, Debby Hitch A, MD, 80 mg at 11/15/24 2140   finasteride  (PROSCAR ) tablet 5 mg, 5 mg, Oral, QPM, Debby Hitch A, MD, 5 mg at 11/15/24 2144   furosemide  (LASIX ) tablet 40 mg, 40 mg, Oral, BID, Debby Hitch A, MD, 40 mg at 11/16/24 9140   insulin  aspart (novoLOG ) injection 0-5 Units, 0-5 Units, Subcutaneous, QHS, Powell, A Meliton Raddle., MD   insulin  aspart (novoLOG ) injection 0-9 Units, 0-9 Units, Subcutaneous, TID WC, Perri DELENA Meliton Raddle., MD,  2 Units at 11/16/24 1134   insulin  glargine (LANTUS ) injection 18 Units, 18 Units, Subcutaneous, QHS, Debby Hitch DELENA, MD, 18 Units at 11/15/24 2140   losartan  (COZAAR ) tablet 100 mg, 100 mg, Oral, Daily, Debby Hitch A, MD, 100 mg at 11/16/24 9141   metoprolol  tartrate (LOPRESSOR ) tablet 25 mg, 25 mg, Oral, BID, Debby Hitch A, MD, 25 mg at 11/16/24 9140   pantoprazole  (PROTONIX ) EC tablet 40 mg, 40 mg, Oral, Daily, Debby Hitch A, MD, 40 mg at 11/16/24 9141   thiamine  (VITAMIN B1) tablet 100 mg, 100 mg, Oral, Daily, Drusilla, Gagan S, MD, 100 mg at 11/16/24 1334   traZODone  (DESYREL ) tablet 100 mg, 100 mg, Oral, QHS, Drusilla, Sabas RAMAN, MD

## 2024-11-16 NOTE — Progress Notes (Signed)
 [facility]  PROGRESS NOTE  Raymond Gordon FMW:[id number] DOB: [birth year] DOA: 11/12/2024 PCP: [name], Richard W, MD   Brief HPI:    73 y.o. male with medical history significant of DMII, Gout, afib, HTN, OSA, Gout, CKD IIIa, Anemia of chronic disease, Dementia, BPH chronic indwelling foley, HLD, Hfpef.    He was found by family, unable to get up from bathroom.  L leg weakness and L arm weakness noted.        Assessment/Plan:    Confusion/?  Sundowning - Patient gets confused usually around 5 PM, although no agitation - Although workup has been negative - BP level 905, TSH 3.4 - MRI brain on 11/12/2024 was unremarkable - Check thiamine  level, start thiamine  100 mg daily UA not concerning for UTI.   TIA Stroke Like Symptoms -Presented with episode of slurred speech and right facial droop - Seen by neurology, suspected left leg weakness is due to small stroke few months ago -MRI brain without any acute abnormality, chronic ischemic changes, remote lacunar infarcts -CTA head and neck stable from 11/25, bulky calcified plaque about L carotid bulb/proximal L ICA with severe 80% stenosis by NASCET criteria - occluded L vertebral artery continue with eliquis  per neurology PT/OT/SLP Needs outpatient neurology follow up    Left lower extremity weakness  neurology suspects the L leg weakness is related to a small stroke a few months ago  MRI of C/T/L spine without findings that look like they're related, neurology notes MRI spine does not convincingly explain L leg weakness per neuro will need out patient EMG/NCS PT/OT   Degenerative Disc Disease Mild to moderate Diffuse Spinal Stenosis at C2-3 through C6-7 Disc Bulge with Facet Hypertrophy at L4-5 with L worse than R Lateral Recess Stenosis with moderate L worse than R L4 Foraminal Narrowing  Disc Protrusion at L3-4  Bilateral Facet Hypertrophy at L3-4 through L5-S1 -consider ortho/spine consult referral outpatient -pt/ot     Mild hyperkalemia Resolved   HFpEF Euvolemic on exam metoprolol /cozaar /lasix     DMII SSI lantus  18 units CBG well-controlled   Gout allopurinol     P Afib continue on eliquis  currently in sinus    HTN Losartan , metoprolol     OSA cpap at bedtime    CKDIIIa -at baseline    Dementia Alert and oriented today, not sure where admitting doc got hx dementia, sister doesn't know this (he lives alone -  Delirium precautions   BPH  -continue finasteride     HLD -continue statin       DVT prophylaxis: Apixaban   Medications     allopurinol   300 mg Oral q AM   apixaban   5 mg Oral BID   aspirin   81 mg Oral Daily   atorvastatin   80 mg Oral QHS   finasteride   5 mg Oral QPM   furosemide   40 mg Oral BID   insulin  aspart  0-5 Units Subcutaneous QHS   insulin  aspart  0-9 Units Subcutaneous TID WC   insulin  glargine  18 Units Subcutaneous QHS   losartan   100 mg Oral Daily   metoprolol  tartrate  25 mg Oral BID   pantoprazole   40 mg Oral Daily     Data Reviewed:   CBG:  Recent Labs  Lab 11/15/24 1151 11/15/24 1538 11/15/24 1723 11/15/24 2122 11/16/24 0604  GLUCAP 177* 158* 173* 165* 120*    SpO2: 98 %    Vitals:   11/15/24 2140 11/15/24 2323 11/15/24 2328 11/16/24 0431  BP: 98/72 (!) 153/49 (!) 153/49 ROLLEN)  144/53  Pulse: (!) 56 (!) 56 (!) 56 (!) 54  Resp:  15  17  Temp:  98.2 F (36.8 C)  98.2 F (36.8 C)  TempSrc:  Oral  Oral  SpO2:  96%  98%  Weight:      Height:          Data Reviewed:  Basic Metabolic Panel: Recent Labs  Lab 11/12/24 1858 11/13/24 0627 11/15/24 1945  NA 138 141 144  K 5.5* 4.2 4.0  CL 103 105 106  CO2 22 22 26   GLUCOSE 161* 141* 194*  BUN 42* 35* 40*  CREATININE 1.14 0.86 1.24  CALCIUM  10.5* 10.2 10.1  MG  --   --  1.7    CBC: Recent Labs  Lab 11/12/24 1858 11/15/24 1945  WBC 5.5 5.8  NEUTROABS 3.9 3.5  HGB 13.2 13.6  HCT 41.6 42.5  MCV 91.4 89.9  PLT 213 241    LFT Recent Labs  Lab  11/12/24 1858 11/15/24 1945  AST 51* 24  ALT 42 27  ALKPHOS 103 105  BILITOT 0.3 0.3  PROT 8.0 7.5  ALBUMIN 3.7 3.7     Antibiotics: Anti-infectives (From admission, onward)    None        CONSULTS   Code Status: Full code  Family Communication: Discussed with patient's sisters at bedside; plan to go to skilled nursing facility once bed becomes available     Subjective   Patient became confused last evening, has been getting confused usually around 5 PM.  Mental status has significantly improved, back to baseline.  Communicating this morning.  All the workup has been negative so far.  Objective    Physical Examination:  General-appears in no acute distress Heart-S1-S2, regular, no murmur auscultated Lungs-clear to auscultation bilaterally, no wheezing or crackles auscultated Abdomen-soft, nontender, no organomegaly Extremities-no edema in the lower extremities Neuro-alert, oriented x3, no focal deficit noted           Lether Tesch S Shelita Steptoe   [facility] If 7PM-7AM, please contact night-coverage at [email or web address], Office  [phone]   11/16/2024, 8:28 AM  LOS: 0 days

## 2024-11-17 DIAGNOSIS — I480 Paroxysmal atrial fibrillation: Secondary | ICD-10-CM | POA: Diagnosis not present

## 2024-11-17 DIAGNOSIS — G459 Transient cerebral ischemic attack, unspecified: Secondary | ICD-10-CM | POA: Diagnosis not present

## 2024-11-17 DIAGNOSIS — R29898 Other symptoms and signs involving the musculoskeletal system: Secondary | ICD-10-CM | POA: Diagnosis not present

## 2024-11-17 LAB — GLUCOSE, CAPILLARY
Glucose-Capillary: 201 mg/dL — ABNORMAL HIGH (ref 70–99)
Glucose-Capillary: 161 mg/dL — ABNORMAL HIGH (ref 70–99)
Glucose-Capillary: 156 mg/dL — ABNORMAL HIGH (ref 70–99)
Glucose-Capillary: 178 mg/dL — ABNORMAL HIGH (ref 70–99)

## 2024-11-17 MED ORDER — TRAZODONE HCL 50 MG PO TABS
50.0000 mg | ORAL_TABLET | Freq: Every day | ORAL | Status: DC
  Administered 2024-11-17: 50 mg via ORAL
  Filled 2024-11-17: qty 1

## 2024-11-17 MED ORDER — FUROSEMIDE 40 MG PO TABS
40.0000 mg | ORAL_TABLET | Freq: Every day | ORAL | Status: DC
  Administered 2024-11-18: 40 mg via ORAL
  Filled 2024-11-17: qty 1

## 2024-11-17 NOTE — Progress Notes (Signed)
 [facility]  PROGRESS NOTE  Raymond Gordon FMW:[id number] DOB: [birth year] DOA: 11/12/2024 PCP: [name], Richard W, MD   Brief HPI:    73 y.o. male with medical history significant of DMII, Gout, afib, HTN, OSA, Gout, CKD IIIa, Anemia of chronic disease, Dementia, BPH chronic indwelling foley, HLD, Hfpef.    He was found by family, unable to get up from bathroom.  L leg weakness and L arm weakness noted.        Assessment/Plan:    Confusion/?  Sundowning - Patient gets confused usually around 5 PM, although no agitation - Although workup has been negative - BP level 905, TSH 3.4 - MRI brain on 11/12/2024 was unremarkable - Check thiamine  level, started on thiamine  100 mg daily -Started on trazodone  100 mg nightly, has been very somnolent.  Will cut down trazodone  to 50 mg nightly -Seen by neurology, recommended to start CPAP at bedtime.  CPAP nightly ordered -EEG was negative for seizure or epileptiform discharges. -UA not concerning for UTI.   TIA Stroke Like Symptoms -Presented with episode of slurred speech and right facial droop - Seen by neurology, suspected left leg weakness is due to small stroke few months ago -MRI brain without any acute abnormality, chronic ischemic changes, remote lacunar infarcts -CTA head and neck stable from 11/25, bulky calcified plaque about L carotid bulb/proximal L ICA with severe 80% stenosis by NASCET criteria - occluded L vertebral artery continue with eliquis  per neurology PT/OT/SLP Needs outpatient neurology follow up    Left lower extremity weakness  neurology suspects the L leg weakness is related to a small stroke a few months ago  MRI of C/T/L spine without findings that look like they're related, neurology notes MRI spine does not convincingly explain L leg weakness per neuro will need out patient EMG/NCS PT/OT   Degenerative Disc Disease Mild to moderate Diffuse Spinal Stenosis at C2-3 through C6-7 Disc Bulge with Facet  Hypertrophy at L4-5 with L worse than R Lateral Recess Stenosis with moderate L worse than R L4 Foraminal Narrowing  Disc Protrusion at L3-4  Bilateral Facet Hypertrophy at L3-4 through L5-S1 -consider ortho/spine consult referral outpatient -pt/ot    Mild hyperkalemia Resolved   HFpEF Euvolemic on exam metoprolol /cozaar /lasix   -Will cut down Lasix  to 40 mg p.o. daily due to soft blood pressure   DMII SSI lantus  18 units CBG well-controlled   Gout allopurinol     P Afib continue on eliquis  currently in sinus    HTN Losartan , metoprolol     OSA cpap at bedtime    CKDIIIa -at baseline    Dementia Alert and oriented today, not sure where admitting doc got hx dementia, sister doesn't know this (he lives alone -  Delirium precautions   BPH  -continue finasteride     HLD -continue statin       DVT prophylaxis: Apixaban   Medications     allopurinol   300 mg Oral q AM   apixaban   5 mg Oral BID   aspirin   81 mg Oral Daily   atorvastatin   80 mg Oral QHS   finasteride   5 mg Oral QPM   furosemide   40 mg Oral BID   insulin  aspart  0-5 Units Subcutaneous QHS   insulin  aspart  0-9 Units Subcutaneous TID WC   insulin  glargine  18 Units Subcutaneous QHS   losartan   100 mg Oral Daily   metoprolol  tartrate  25 mg Oral BID   pantoprazole   40 mg Oral Daily   thiamine   100 mg Oral Daily   traZODone   50 mg Oral QHS     Data Reviewed:   CBG:  Recent Labs  Lab 11/16/24 1657 11/16/24 2114 11/17/24 0623 11/17/24 1156 11/17/24 1602  GLUCAP 133* 220* 201* 161* 156*    SpO2: 100 %    Vitals:   11/17/24 0749 11/17/24 1143 11/17/24 1606 11/17/24 1607  BP: (!) 174/69 (!) 110/40  (!) 105/49  Pulse: 86 (!) 52  (!) 49  Resp: 16 16  18   Temp: 98.2 F (36.8 C) 98.2 F (36.8 C) (!) 97.4 F (36.3 C)   TempSrc: Oral Oral Oral   SpO2: 100% 98%  100%  Weight:      Height:          Data Reviewed:  Basic Metabolic Panel: Recent Labs  Lab 11/12/24 1858  11/13/24 0627 11/15/24 1945  NA 138 141 144  K 5.5* 4.2 4.0  CL 103 105 106  CO2 22 22 26   GLUCOSE 161* 141* 194*  BUN 42* 35* 40*  CREATININE 1.14 0.86 1.24  CALCIUM  10.5* 10.2 10.1  MG  --   --  1.7    CBC: Recent Labs  Lab 11/12/24 1858 11/15/24 1945  WBC 5.5 5.8  NEUTROABS 3.9 3.5  HGB 13.2 13.6  HCT 41.6 42.5  MCV 91.4 89.9  PLT 213 241    LFT Recent Labs  Lab 11/12/24 1858 11/15/24 1945  AST 51* 24  ALT 42 27  ALKPHOS 103 105  BILITOT 0.3 0.3  PROT 8.0 7.5  ALBUMIN 3.7 3.7     Antibiotics: Anti-infectives (From admission, onward)    None        CONSULTS   Code Status: Full code  Family Communication: Discussed with patient's sisters at bedside; plan to go to skilled nursing facility once bed becomes available     Subjective  Patient has been getting intermittently confused especially in the evenings.  Somnolent this morning after he was started on trazodone  100 mg nightly last night.  Blood pressure has been soft.  He has been getting Lasix  40 mg p.o. twice daily.  Objective    Physical Examination:   General-appears in no acute distress Heart-S1-S2, regular, no murmur auscultated Lungs-clear to auscultation bilaterally, no wheezing or crackles auscultated Abdomen-soft, nontender, no organomegaly Extremities-no edema in the lower extremities Neuro-alert, oriented x3, no focal deficit noted          Analese Sovine S Shahana Capes   [facility] If 7PM-7AM, please contact night-coverage at [email or web address], Office  [phone]   11/17/2024, 5:49 PM  LOS: 1 day

## 2024-11-17 NOTE — Plan of Care (Signed)

## 2024-11-17 NOTE — TOC Progression Note (Addendum)
 Transition of Care [facility]) - Progression Note    Patient Details  Name: Raymond Gordon MRN: [id number] Date of Birth: 17-Dec-1950  [facility] CM/SW Contact  Bridget Cordella Simmonds, LCSW Phone Number: 11/17/2024, 10:04 AM  Clinical Narrative:   CSW informed by MD that P2P for SNF auth denied by Dr Iantha.  CSW left message with Connie/HTA requesting denial letter so it can be delivered to pt.   1030: CSW spoke with pt and all three sisters.  Updated them that SNF auth denied, waiting on denial letter.  Discussed options including appeal.  They will talk about this and CSW to bring denial letter when available.    Expected Discharge Plan: Skilled Nursing Facility Barriers to Discharge: Insurance Authorization               Expected Discharge Plan and Services                                               Social Drivers of Health (SDOH) Interventions SDOH Screenings   Food Insecurity: No Food Insecurity (11/13/2024)  Housing: Low Risk (11/13/2024)  Transportation Needs: Unmet Transportation Needs (11/13/2024)  Utilities: Not At Risk (11/13/2024)  Social Connections: Moderately Isolated (11/13/2024)  Tobacco Use: Medium Risk (11/13/2024)    Readmission Risk Interventions     No data to display

## 2024-11-18 DIAGNOSIS — I480 Paroxysmal atrial fibrillation: Secondary | ICD-10-CM | POA: Diagnosis not present

## 2024-11-18 DIAGNOSIS — G459 Transient cerebral ischemic attack, unspecified: Secondary | ICD-10-CM | POA: Diagnosis not present

## 2024-11-18 DIAGNOSIS — R29898 Other symptoms and signs involving the musculoskeletal system: Secondary | ICD-10-CM | POA: Diagnosis not present

## 2024-11-18 LAB — GLUCOSE, CAPILLARY
Glucose-Capillary: 179 mg/dL — ABNORMAL HIGH (ref 70–99)
Glucose-Capillary: 206 mg/dL — ABNORMAL HIGH (ref 70–99)
Glucose-Capillary: 180 mg/dL — ABNORMAL HIGH (ref 70–99)
Glucose-Capillary: 180 mg/dL — ABNORMAL HIGH (ref 70–99)
Glucose-Capillary: 172 mg/dL — ABNORMAL HIGH (ref 70–99)

## 2024-11-18 LAB — BASIC METABOLIC PANEL WITH GFR
Anion gap: 13 (ref 5–15)
BUN: 62 mg/dL — ABNORMAL HIGH (ref 8–23)
CO2: 22 mmol/L (ref 22–32)
Calcium: 9.5 mg/dL (ref 8.9–10.3)
Chloride: 103 mmol/L (ref 98–111)
Creatinine, Ser: 1.75 mg/dL — ABNORMAL HIGH (ref 0.61–1.24)
GFR, Estimated: 40 mL/min — ABNORMAL LOW
Glucose, Bld: 154 mg/dL — ABNORMAL HIGH (ref 70–99)
Potassium: 4.2 mmol/L (ref 3.5–5.1)
Sodium: 138 mmol/L (ref 135–145)

## 2024-11-18 MED ORDER — SODIUM CHLORIDE 0.9 % IV SOLN: INTRAVENOUS | Status: AC

## 2024-11-18 MED ORDER — TRAZODONE HCL 50 MG PO TABS
25.0000 mg | ORAL_TABLET | Freq: Every day | ORAL | Status: DC
  Administered 2024-11-18 – 2024-11-20 (×3): 25 mg via ORAL
  Filled 2024-11-18 (×3): qty 1

## 2024-11-18 NOTE — Progress Notes (Signed)
" °   11/18/24 1956  BiPAP/CPAP/SIPAP  BiPAP/CPAP/SIPAP Pt Type Adult  Reason BIPAP/CPAP not in use Non-compliant    "

## 2024-11-18 NOTE — Plan of Care (Signed)
   Problem: Coping: Goal: Will verbalize positive feelings about self Outcome: Progressing

## 2024-11-18 NOTE — Plan of Care (Signed)
" °  Problem: Ischemic Stroke/TIA Tissue Perfusion: Goal: Complications of ischemic stroke/TIA will be minimized Outcome: Progressing   Problem: Nutrition: Goal: Risk of aspiration will decrease Outcome: Progressing Goal: Dietary intake will improve Outcome: Progressing   Problem: Education: Goal: Knowledge of disease or condition will improve Outcome: Not Progressing Goal: Knowledge of secondary prevention will improve (MUST DOCUMENT ALL) Outcome: Not Progressing Goal: Knowledge of patient specific risk factors will improve (DELETE if not current risk factor) Outcome: Not Progressing   Problem: Health Behavior/Discharge Planning: Goal: Ability to manage health-related needs will improve Outcome: Not Progressing   Problem: Self-Care: Goal: Ability to participate in self-care as condition permits will improve Outcome: Not Progressing   "

## 2024-11-18 NOTE — Progress Notes (Signed)
" °   11/18/24 0100  BiPAP/CPAP/SIPAP  Reason BIPAP/CPAP not in use Non-compliant (Pt. refused.)    "

## 2024-11-18 NOTE — Progress Notes (Signed)
 ``[facility]  PROGRESS NOTE  Raymond Gordon FMW:[id number] DOB: 1951/11/19 DOA: 11/12/2024 PCP: Raymond [name], MD   Brief HPI:    73 y.o. male with medical history significant of DMII, Gout, afib, HTN, OSA, Gout, CKD IIIa, Anemia of chronic disease, Dementia, BPH chronic indwelling foley, HLD, Hfpef.    He was found by family, unable to get up from bathroom.  L leg weakness and L arm weakness noted.        Assessment/Plan:    Confusion/?  Sundowning - Patient gets confused usually around 5 PM, although no agitation - Although workup has been negative - BP level 905, TSH 3.4 - MRI brain on 11/12/2024 was unremarkable - Check thiamine  level, started on thiamine  100 mg daily -Started on trazodone  100 mg nightly, dose was changed to 50 mg nightly on 12/24.  He still continues to be somnolent.  Will cut down the dose of trazodone  to 25 mg p.o. nightly. -Seen by neurology, recommended to start CPAP at bedtime.  CPAP nightly ordered -EEG was negative for seizure or epileptiform discharges. -UA not concerning for UTI.   TIA Stroke Like Symptoms -Presented with episode of slurred speech and right facial droop - Seen by neurology, suspected left leg weakness is due to small stroke few months ago -MRI brain without any acute abnormality, chronic ischemic changes, remote lacunar infarcts -CTA head and neck stable from 11/25, bulky calcified plaque about L carotid bulb/proximal L ICA with severe 80% stenosis by NASCET criteria - occluded L vertebral artery continue with eliquis  per neurology PT/OT/SLP Needs outpatient neurology follow up  Acute kidney injury - BUN/creatinine elevated 62/1.75 - Patient now looks dry from excessive diuresis; as of Lasix  was changed to 40 mg daily - Will hold Lasix  - Start gentle IV hydration with normal saline at 75 mL/h for 12 hours    Left lower extremity weakness  neurology suspects the L leg weakness is related to a small stroke a few  months ago  MRI of C/T/L spine without findings that look like they're related, neurology notes MRI spine does not convincingly explain L leg weakness per neuro will need out patient EMG/NCS PT/OT   Degenerative Disc Disease Mild to moderate Diffuse Spinal Stenosis at C2-3 through C6-7 Disc Bulge with Facet Hypertrophy at L4-5 with L worse than R Lateral Recess Stenosis with moderate L worse than R L4 Foraminal Narrowing  Disc Protrusion at L3-4  Bilateral Facet Hypertrophy at L3-4 through L5-S1 -consider ortho/spine consult referral outpatient -pt/ot    Mild hyperkalemia Resolved   HFpEF Euvolemic on exam metoprolol /cozaar /lasix   -Will cut down Lasix  to 40 mg p.o. daily due to soft blood pressure   DMII SSI lantus  18 units CBG well-controlled   Gout allopurinol     P Afib continue on eliquis  currently in sinus    HTN Losartan , metoprolol     OSA cpap at bedtime    CKDIIIa -at baseline    Dementia Alert and oriented today, not sure where admitting doc got hx dementia, sister doesn't know this (he lives alone -  Delirium precautions   BPH  -continue finasteride     HLD -continue statin       DVT prophylaxis: Apixaban   Medications     allopurinol   300 mg Oral q AM   apixaban   5 mg Oral BID   aspirin   81 mg Oral Daily   atorvastatin   80 mg Oral QHS   finasteride   5 mg Oral QPM   furosemide   40  mg Oral Daily   insulin  aspart  0-5 Units Subcutaneous QHS   insulin  aspart  0-9 Units Subcutaneous TID WC   insulin  glargine  18 Units Subcutaneous QHS   losartan   100 mg Oral Daily   metoprolol  tartrate  25 mg Oral BID   pantoprazole   40 mg Oral Daily   thiamine   100 mg Oral Daily   traZODone   50 mg Oral QHS     Data Reviewed:   CBG:  Recent Labs  Lab 11/17/24 0623 11/17/24 1156 11/17/24 1602 11/17/24 2121 11/18/24 0613  GLUCAP 201* 161* 156* 178* 179*    SpO2: 100 %    Vitals:   11/17/24 2357 11/18/24 0100 11/18/24 0340 11/18/24 0804   BP: (!) 130/46  (!) 122/52 (!) 140/48  Pulse: (!) 48  (!) 46 (!) 49  Resp: 11 15  18   Temp: 98.6 F (37 C)  98 F (36.7 C) 97.9 F (36.6 C)  TempSrc: Axillary  Axillary   SpO2: 100%  96% 100%  Weight:      Height:          Data Reviewed:  Basic Metabolic Panel: Recent Labs  Lab 11/12/24 1858 11/13/24 0627 11/15/24 1945 11/18/24 0531  NA 138 141 144 138  K 5.5* 4.2 4.0 4.2  CL 103 105 106 103  CO2 22 22 26 22   GLUCOSE 161* 141* 194* 154*  BUN 42* 35* 40* 62*  CREATININE 1.14 0.86 1.24 1.75*  CALCIUM  10.5* 10.2 10.1 9.5  MG  --   --  1.7  --     CBC: Recent Labs  Lab 11/12/24 1858 11/15/24 1945  WBC 5.5 5.8  NEUTROABS 3.9 3.5  HGB 13.2 13.6  HCT 41.6 42.5  MCV 91.4 89.9  PLT 213 241    LFT Recent Labs  Lab 11/12/24 1858 11/15/24 1945  AST 51* 24  ALT 42 27  ALKPHOS 103 105  BILITOT 0.3 0.3  PROT 8.0 7.5  ALBUMIN 3.7 3.7     Antibiotics: Anti-infectives (From admission, onward)    None        CONSULTS   Code Status: Full code  Family Communication: Discussed with patient's sisters at bedside; plan to go to skilled nursing facility once bed becomes available     Subjective   Patient is still very somnolent, dose of trazodone  was changed to 50 mg p.o. nightly.  Objective    Physical Examination:  General-appears in no acute distress Heart-S1-S2, regular, no murmur auscultated Lungs-clear to auscultation bilaterally, no wheezing or crackles auscultated Abdomen-soft, nontender, no organomegaly Extremities-no edema in the lower extremities Neuro-alert, oriented x3, no focal deficit noted           Raymond Gordon   [facility] If 7PM-7AM, please contact night-coverage at [email or web address], Office  [phone]   11/18/2024, 11:00 AM  LOS: 2 days

## 2024-11-19 DIAGNOSIS — R29898 Other symptoms and signs involving the musculoskeletal system: Secondary | ICD-10-CM | POA: Diagnosis not present

## 2024-11-19 DIAGNOSIS — G459 Transient cerebral ischemic attack, unspecified: Secondary | ICD-10-CM | POA: Diagnosis not present

## 2024-11-19 DIAGNOSIS — I480 Paroxysmal atrial fibrillation: Secondary | ICD-10-CM | POA: Diagnosis not present

## 2024-11-19 LAB — BASIC METABOLIC PANEL WITH GFR
Anion gap: 10 (ref 5–15)
BUN: 66 mg/dL — ABNORMAL HIGH (ref 8–23)
CO2: 24 mmol/L (ref 22–32)
Calcium: 9.3 mg/dL (ref 8.9–10.3)
Chloride: 106 mmol/L (ref 98–111)
Creatinine, Ser: 1.48 mg/dL — ABNORMAL HIGH (ref 0.61–1.24)
GFR, Estimated: 49 mL/min — ABNORMAL LOW
Glucose, Bld: 138 mg/dL — ABNORMAL HIGH (ref 70–99)
Potassium: 4 mmol/L (ref 3.5–5.1)
Sodium: 139 mmol/L (ref 135–145)

## 2024-11-19 LAB — GLUCOSE, CAPILLARY
Glucose-Capillary: 148 mg/dL — ABNORMAL HIGH (ref 70–99)
Glucose-Capillary: 180 mg/dL — ABNORMAL HIGH (ref 70–99)
Glucose-Capillary: 104 mg/dL — ABNORMAL HIGH (ref 70–99)
Glucose-Capillary: 157 mg/dL — ABNORMAL HIGH (ref 70–99)

## 2024-11-19 LAB — VITAMIN B1: Vitamin B1 (Thiamine): 131.1 nmol/L (ref 66.5–200.0)

## 2024-11-19 MED ORDER — METOPROLOL TARTRATE 12.5 MG HALF TABLET
12.5000 mg | ORAL_TABLET | Freq: Two times a day (BID) | ORAL | Status: DC
  Administered 2024-11-19 – 2024-11-20 (×3): 12.5 mg via ORAL
  Filled 2024-11-19 (×5): qty 1

## 2024-11-19 NOTE — TOC Progression Note (Addendum)
 Transition of Care [facility]) - Progression Note    Patient Details  Name: Raymond Gordon MRN: [id number] Date of Birth: [birth year]  [facility] CM/SW Contact  [name], [name] Phone Number: 11/19/2024, 11:43 AM  Clinical Narrative:   CSW contacted [facility] on call nurse as the denial letter for SNF was never received. On call nurse obtained CSW fax number and said she would send the denial letter over. CSW awaiting receipt of denial letter.   CSW spoke with patient's sister, Raymond Gordon, to discuss decision on what to do. Raymond Gordon confirmed desire to appeal the decision, as the patient cannot return back home alone due to his weakness. CSW to contact sister back after denial letter has been received to discuss the appeal process.  UPDATE 4:16 PM: CSW contacted [facility] back at 1:50 PM as denial letter was never received to provide another fax number to send. CSW still awaiting denial letter from [facility] for appeal process. CSW to follow.    Expected Discharge Plan: Skilled Nursing Facility Barriers to Discharge: Insurance Authorization               Expected Discharge Plan and Services                                               Social Drivers of Health (SDOH) Interventions SDOH Screenings   Food Insecurity: No Food Insecurity (11/13/2024)  Housing: Low Risk (11/13/2024)  Transportation Needs: Unmet Transportation Needs (11/13/2024)  Utilities: Not At Risk (11/13/2024)  Social Connections: Moderately Isolated (11/13/2024)  Tobacco Use: Medium Risk (11/13/2024)    Readmission Risk Interventions     No data to display

## 2024-11-19 NOTE — Care Management Important Message (Signed)
 Important Message  Patient Details  Name: Raymond Gordon MRN: [id number] Date of Birth: [birth year]   Important Message Given:  Yes - Medicare IM     Claretta Deed 11/19/2024, 3:07 PM

## 2024-11-19 NOTE — Plan of Care (Signed)
 Problem: Education: Goal: Knowledge of disease or condition will improve Outcome: Adequate for Discharge Goal: Knowledge of secondary prevention will improve (MUST DOCUMENT ALL) Outcome: Adequate for Discharge Goal: Knowledge of patient specific risk factors will improve (DELETE if not current risk factor) Outcome: Adequate for Discharge   Problem: Ischemic Stroke/TIA Tissue Perfusion: Goal: Complications of ischemic stroke/TIA will be minimized Outcome: Adequate for Discharge   Problem: Coping: Goal: Will verbalize positive feelings about self Outcome: Adequate for Discharge Goal: Will identify appropriate support needs Outcome: Adequate for Discharge   Problem: Health Behavior/Discharge Planning: Goal: Ability to manage health-related needs will improve Outcome: Adequate for Discharge Goal: Goals will be collaboratively established with patient/family Outcome: Adequate for Discharge   Problem: Self-Care: Goal: Ability to participate in self-care as condition permits will improve Outcome: Adequate for Discharge Goal: Verbalization of feelings and concerns over difficulty with self-care will improve Outcome: Adequate for Discharge Goal: Ability to communicate needs accurately will improve Outcome: Adequate for Discharge   Problem: Nutrition: Goal: Risk of aspiration will decrease Outcome: Adequate for Discharge Goal: Dietary intake will improve Outcome: Adequate for Discharge   Problem: Education: Goal: Ability to describe self-care measures that may prevent or decrease complications (Diabetes Survival Skills Education) will improve Outcome: Adequate for Discharge Goal: Individualized Educational Video(s) Outcome: Adequate for Discharge   Problem: Coping: Goal: Ability to adjust to condition or change in health will improve Outcome: Adequate for Discharge   Problem: Fluid Volume: Goal: Ability to maintain a balanced intake and output will improve Outcome: Adequate  for Discharge   Problem: Health Behavior/Discharge Planning: Goal: Ability to identify and utilize available resources and services will improve Outcome: Adequate for Discharge Goal: Ability to manage health-related needs will improve Outcome: Adequate for Discharge   Problem: Metabolic: Goal: Ability to maintain appropriate glucose levels will improve Outcome: Adequate for Discharge   Problem: Nutritional: Goal: Maintenance of adequate nutrition will improve Outcome: Adequate for Discharge Goal: Progress toward achieving an optimal weight will improve Outcome: Adequate for Discharge   Problem: Skin Integrity: Goal: Risk for impaired skin integrity will decrease Outcome: Adequate for Discharge   Problem: Tissue Perfusion: Goal: Adequacy of tissue perfusion will improve Outcome: Adequate for Discharge   Problem: Education: Goal: Knowledge of General Education information will improve Description: Including pain rating scale, medication(s)/side effects and non-pharmacologic comfort measures Outcome: Adequate for Discharge   Problem: Health Behavior/Discharge Planning: Goal: Ability to manage health-related needs will improve Outcome: Adequate for Discharge   Problem: Clinical Measurements: Goal: Ability to maintain clinical measurements within normal limits will improve Outcome: Adequate for Discharge Goal: Will remain free from infection Outcome: Adequate for Discharge Goal: Diagnostic test results will improve Outcome: Adequate for Discharge Goal: Respiratory complications will improve Outcome: Adequate for Discharge Goal: Cardiovascular complication will be avoided Outcome: Adequate for Discharge   Problem: Activity: Goal: Risk for activity intolerance will decrease Outcome: Adequate for Discharge   Problem: Nutrition: Goal: Adequate nutrition will be maintained Outcome: Adequate for Discharge   Problem: Coping: Goal: Level of anxiety will decrease Outcome:  Adequate for Discharge   Problem: Elimination: Goal: Will not experience complications related to bowel motility Outcome: Adequate for Discharge Goal: Will not experience complications related to urinary retention Outcome: Adequate for Discharge   Problem: Pain Managment: Goal: General experience of comfort will improve and/or be controlled Outcome: Adequate for Discharge   Problem: Safety: Goal: Ability to remain free from injury will improve Outcome: Adequate for Discharge   Problem: Skin Integrity: Goal: Risk for  impaired skin integrity will decrease Outcome: Adequate for Discharge

## 2024-11-19 NOTE — TOC CAGE-AID Note (Signed)
 Transition of Care [facility]) - CAGE-AID Screening   Patient Details  Name: Raymond Gordon MRN: [id number] Date of Birth: [birth year]  [facility] CM/SW Contact:    Chaitanya Amedee E Paulette [place], LCSW Phone Number: 11/19/2024, 9:38 AM   Clinical Narrative: Disoriented x 2. No SA noted.   CAGE-AID Screening: Substance Abuse Screening unable to be completed due to: : Patient unable to participate             Substance Abuse Education Offered: No

## 2024-11-19 NOTE — Progress Notes (Signed)
 Physical Therapy Treatment Patient Details Name: Raymond Gordon MRN: [id number] DOB: [birth year] Today's Date: 11/19/2024   History of Present Illness Pt is a 73 y/o M who presented to [facility] ED with episode of slurred speech and RFD. MRI brain negative for acute findings. PMHx: DMII, HLD, gout, afib, HTN, CHF, OSA, CKDIII, anemia, dementia, BPH with chronic indwelling foley catheter.    PT Comments  Pt supine in bed on arrival.  He is pleasant and motivated to participate in PT this pm.  Pt continues to require external assistance to mobilize this session.  LOB noted x 2 with turns.  He continues to be a high risk for repeated falls.  Continue to recommend rehab in a post acute setting.      If plan is discharge home, recommend the following: A lot of help with bathing/dressing/bathroom;Assistance with cooking/housework;Assist for transportation;Help with stairs or ramp for entrance;A little help with walking and/or transfers;Supervision due to cognitive status;Direct supervision/assist for medications management   Can travel by private vehicle     Yes  Equipment Recommendations  None recommended by PT    Recommendations for Other Services       Precautions / Restrictions Precautions Precautions: Fall Recall of Precautions/Restrictions: Intact Precaution/Restrictions Comments: fearful of falling Restrictions Weight Bearing Restrictions Per Provider Order: No     Mobility  Bed Mobility Overal bed mobility: Needs Assistance Bed Mobility: Supine to Sit     Supine to sit: Contact guard, HOB elevated, Used rails     General bed mobility comments: Increased time to move to edge of bed this session.  Max VCs to scoot forward to position himself to stand.    Transfers Overall transfer level: Needs assistance Equipment used: Rolling walker (2 wheels) Transfers: Sit to/from Stand Sit to Stand: Min assist, From elevated surface           General transfer comment: Min +1 with poor  anterior translation and posterior LOB this session.  Pt with mild LOB during transition from bed.    Ambulation/Gait Ambulation/Gait assistance: Min assist, Mod assist Gait Distance (Feet): 60 Feet Assistive device: Rolling walker (2 wheels) Gait Pattern/deviations: Step-to pattern, Staggering right, Decreased step length - right, Decreased step length - left, Decreased stride length, Shuffle, Trunk flexed, Narrow base of support Gait velocity: decreased     General Gait Details: LOB noted x 2 with turns.  Pt required increased assistance to correct his balance.  Pt with short steps, cues for posture and increasing B stride length this session.   Stairs             Wheelchair Mobility     Tilt Bed    Modified Rankin (Stroke Patients Only)       Balance Overall balance assessment: Needs assistance Sitting-balance support: Feet supported, Bilateral upper extremity supported Sitting balance-Leahy Scale: Fair Sitting balance - Comments: able to maintain static sitting balance with supervision     Standing balance-Leahy Scale: Poor Standing balance comment: reliant on external support from therapist and on RW for balance                            Communication Communication Communication: No apparent difficulties Factors Affecting Communication: Reduced clarity of speech  Cognition Arousal: Alert Behavior During Therapy: Flat affect   PT - Cognitive impairments: History of cognitive impairments  PT - Cognition Comments: dementia noted in PMH, Increased time to follow commands, otherwise appropriate Following commands: Impaired Following commands impaired: Follows multi-step commands with increased time    Cueing Cueing Techniques: Verbal cues, Gestural cues  Exercises General Exercises - Lower Extremity Ankle Circles/Pumps: Both    General Comments        Pertinent Vitals/Pain Pain Assessment Pain Assessment:  No/denies pain    Home Living                          Prior Function            PT Goals (current goals can now be found in the care plan section) Acute Rehab PT Goals Patient Stated Goal: to return to independent ambulation, improve LLE strength Potential to Achieve Goals: Good Progress towards PT goals: Progressing toward goals    Frequency    Min 2X/week      PT Plan      Co-evaluation              AM-PAC PT 6 Clicks Mobility   Outcome Measure  Help needed turning from your back to your side while in a flat bed without using bedrails?: A Little Help needed moving from lying on your back to sitting on the side of a flat bed without using bedrails?: A Little Help needed moving to and from a bed to a chair (including a wheelchair)?: A Little Help needed standing up from a chair using your arms (e.g., wheelchair or bedside chair)?: A Lot Help needed to walk in hospital room?: A Lot Help needed climbing 3-5 steps with a railing? : A Lot 6 Click Score: 15    End of Session Equipment Utilized During Treatment: Gait belt Activity Tolerance: Patient tolerated treatment well Patient left: with call bell/phone within reach;in chair;with chair alarm set;with family/visitor present Nurse Communication: Mobility status PT Visit Diagnosis: Other abnormalities of gait and mobility (R26.89);Muscle weakness (generalized) (M62.81);Unsteadiness on feet (R26.81)     Time: 8467-8447 PT Time Calculation (min) (ACUTE ONLY): 20 min  Charges:    $Gait Training: 8-22 mins PT General Charges $$ ACUTE PT VISIT: 1 Visit                     Raymond Gordon , PTA Acute Rehabilitation Services Office [phone]    Raymond Gordon 11/19/2024, 4:04 PM

## 2024-11-19 NOTE — Progress Notes (Signed)
 ``[facility]  PROGRESS NOTE  Raymond Gordon FMW:[id number] DOB: 01/16/1951 DOA: 11/12/2024 PCP: [name], MD   Brief HPI:    73 y.o. male with medical history significant of DMII, Gout, afib, HTN, OSA, Gout, CKD IIIa, Anemia of chronic disease, Dementia, BPH chronic indwelling foley, HLD, Hfpef.    He was found by family, unable to get up from bathroom.  L leg weakness and L arm weakness noted.        Assessment/Plan:    Confusion/?  Sundowning - Patient gets confused usually around 5 PM, although no agitation - Although workup has been negative - BP level 905, TSH 3.4 - MRI brain on 11/12/2024 was unremarkable - Check thiamine  level 131, started on thiamine  100 mg daily -Started on trazodone  100 mg nightly, dose was changed to 50 mg nightly on 12/24.   - Mentation improved after cutting down the dose of trazodone  to 25 mg p.o. nightly  -Seen by neurology, recommended to start CPAP at bedtime.  CPAP nightly ordered -EEG was negative for seizure or epileptiform discharges. -UA not concerning for UTI.   TIA Stroke Like Symptoms -Presented with episode of slurred speech and right facial droop - Seen by neurology, suspected left leg weakness is due to small stroke few months ago -MRI brain without any acute abnormality, chronic ischemic changes, remote lacunar infarcts -CTA head and neck stable from 11/25, bulky calcified plaque about L carotid bulb/proximal L ICA with severe 80% stenosis by NASCET criteria - occluded L vertebral artery continue with eliquis  per neurology PT/OT/SLP Needs outpatient neurology follow up  Acute kidney injury - BUN/creatinine elevated 62/1.75 -Improved after holding Lasix  and giving IV fluids -Today creatinine is 66/1.48 - Follow BMP in am  Bradycardia - Heart rate has been in low 50s - Will cut down the dose of metoprolol  to 12.5 mg p.o. twice daily    Left lower extremity weakness  neurology suspects the L leg weakness is  related to a small stroke a few months ago  MRI of C/T/L spine without findings that look like they're related, neurology notes MRI spine does not convincingly explain L leg weakness per neuro will need out patient EMG/NCS PT/OT   Degenerative Disc Disease Mild to moderate Diffuse Spinal Stenosis at C2-3 through C6-7 Disc Bulge with Facet Hypertrophy at L4-5 with L worse than R Lateral Recess Stenosis with moderate L worse than R L4 Foraminal Narrowing  Disc Protrusion at L3-4  Bilateral Facet Hypertrophy at L3-4 through L5-S1 -consider ortho/spine consult referral outpatient -pt/ot    Mild hyperkalemia Resolved   HFpEF Euvolemic on exam metoprolol /cozaar /lasix   -Will cut down Lasix  to 40 mg p.o. daily due to soft blood pressure   DMII SSI lantus  18 units CBG well-controlled   Gout allopurinol     P Afib continue on eliquis  currently in sinus    HTN Losartan , metoprolol     OSA cpap at bedtime    CKDIIIa -at baseline      BPH  -continue finasteride     HLD -continue statin       DVT prophylaxis: Apixaban   Medications     allopurinol   300 mg Oral q AM   apixaban   5 mg Oral BID   aspirin   81 mg Oral Daily   atorvastatin   80 mg Oral QHS   finasteride   5 mg Oral QPM   insulin  aspart  0-5 Units Subcutaneous QHS   insulin  aspart  0-9 Units Subcutaneous TID WC   insulin  glargine  18 Units Subcutaneous QHS   losartan   100 mg Oral Daily   metoprolol  tartrate  12.5 mg Oral BID   pantoprazole   40 mg Oral Daily   thiamine   100 mg Oral Daily   traZODone   25 mg Oral QHS     Data Reviewed:   CBG:  Recent Labs  Lab 11/18/24 1110 11/18/24 1714 11/18/24 2117 11/18/24 2132 11/19/24 0615  GLUCAP 206* 180* 180* 172* 148*    SpO2: 100 %    Vitals:   11/18/24 2058 11/18/24 2102 11/19/24 0407 11/19/24 0747  BP: (!) 116/53 (!) 107/46 (!) 156/53 (!) 147/57  Pulse: (!) 50 (!) 50 (!) 53 (!) 50  Resp: 16 18 14 18   Temp: 97.7 F (36.5 C) 97.7 F (36.5  C) 97.6 F (36.4 C) 97.6 F (36.4 C)  TempSrc: Oral Oral Oral Oral  SpO2: 100% 100% 100% 100%  Weight:      Height:          Data Reviewed:  Basic Metabolic Panel: Recent Labs  Lab 11/12/24 1858 11/13/24 0627 11/15/24 1945 11/18/24 0531 11/19/24 0151  NA 138 141 144 138 139  K 5.5* 4.2 4.0 4.2 4.0  CL 103 105 106 103 106  CO2 22 22 26 22 24   GLUCOSE 161* 141* 194* 154* 138*  BUN 42* 35* 40* 62* 66*  CREATININE 1.14 0.86 1.24 1.75* 1.48*  CALCIUM  10.5* 10.2 10.1 9.5 9.3  MG  --   --  1.7  --   --     CBC: Recent Labs  Lab 11/12/24 1858 11/15/24 1945  WBC 5.5 5.8  NEUTROABS 3.9 3.5  HGB 13.2 13.6  HCT 41.6 42.5  MCV 91.4 89.9  PLT 213 241    LFT Recent Labs  Lab 11/12/24 1858 11/15/24 1945  AST 51* 24  ALT 42 27  ALKPHOS 103 105  BILITOT 0.3 0.3  PROT 8.0 7.5  ALBUMIN 3.7 3.7     Antibiotics: Anti-infectives (From admission, onward)    None        CONSULTS   Code Status: Full code  Family Communication: Discussed with patient's sisters at bedside; plan to go to skilled nursing facility once bed becomes available     Subjective   Patient seen and examined, he is more alert today.  Dose of trazodone  was cut down to 25 mg p.o. nightly.  Objective    Physical Examination:  General-appears in no acute distress Heart-S1-S2, regular, no murmur auscultated Lungs-clear to auscultation bilaterally, no wheezing or crackles auscultated Abdomen-soft, nontender, no organomegaly Extremities-no edema in the lower extremities Neuro-alert, oriented x3, no focal deficit noted           Senica Crall S Allien Melberg   [facility] If 7PM-7AM, please contact night-coverage at [email or web address], Office  [phone]   11/19/2024, 9:28 AM  LOS: 3 days

## 2024-11-20 DIAGNOSIS — G459 Transient cerebral ischemic attack, unspecified: Secondary | ICD-10-CM | POA: Diagnosis not present

## 2024-11-20 DIAGNOSIS — R29898 Other symptoms and signs involving the musculoskeletal system: Secondary | ICD-10-CM | POA: Diagnosis not present

## 2024-11-20 DIAGNOSIS — I480 Paroxysmal atrial fibrillation: Secondary | ICD-10-CM | POA: Diagnosis not present

## 2024-11-20 LAB — GLUCOSE, CAPILLARY
Glucose-Capillary: 132 mg/dL — ABNORMAL HIGH (ref 70–99)
Glucose-Capillary: 168 mg/dL — ABNORMAL HIGH (ref 70–99)
Glucose-Capillary: 171 mg/dL — ABNORMAL HIGH (ref 70–99)
Glucose-Capillary: 142 mg/dL — ABNORMAL HIGH (ref 70–99)
Glucose-Capillary: 97 mg/dL (ref 70–99)

## 2024-11-20 LAB — BASIC METABOLIC PANEL WITH GFR
Anion gap: 7 (ref 5–15)
BUN: 50 mg/dL — ABNORMAL HIGH (ref 8–23)
CO2: 26 mmol/L (ref 22–32)
Calcium: 9.3 mg/dL (ref 8.9–10.3)
Chloride: 109 mmol/L (ref 98–111)
Creatinine, Ser: 1.16 mg/dL (ref 0.61–1.24)
GFR, Estimated: >60 mL/min
Glucose, Bld: 147 mg/dL — ABNORMAL HIGH (ref 70–99)
Potassium: 4.1 mmol/L (ref 3.5–5.1)
Sodium: 142 mmol/L (ref 135–145)

## 2024-11-20 MED ORDER — AMLODIPINE BESYLATE 5 MG PO TABS
5.0000 mg | ORAL_TABLET | Freq: Every day | ORAL | Status: DC
  Administered 2024-11-20 – 2024-11-25 (×6): 5 mg via ORAL
  Filled 2024-11-20 (×3): qty 1

## 2024-11-20 NOTE — Progress Notes (Addendum)
 ``Raymond Hospitalist  PROGRESS NOTE  Raymond Gordon FMW:[id number] DOB: October 29, 1951 DOA: 11/12/2024 PCP: Raymond [name], MD   Brief HPI:    73 y.o. male with medical history significant of DMII, Gout, afib, HTN, OSA, Gout, CKD IIIa, Anemia of chronic disease, Dementia, BPH chronic indwelling foley, HLD, Hfpef.    He was found by family, unable to get up from bathroom.  L leg weakness and L arm weakness noted.        Assessment/Plan:    Confusion/?  Sundowning - Patient gets confused usually around 5 PM, although no agitation - Although workup has been negative - BP level 905, TSH 3.4 - MRI brain on 11/12/2024 was unremarkable - Checked thiamine  level 131, started on thiamine  100 mg daily -Started on trazodone  100 mg nightly, dose was changed to 25 mg due to somnolence .   - Mentation improved after cutting down the dose of trazodone  to 25 mg p.o. nightly  -Seen by neurology, recommended to start CPAP at bedtime.  CPAP nightly ordered -EEG was negative for seizure or epileptiform discharges. -UA not concerning for UTI.   TIA Stroke Like Symptoms -Presented with episode of slurred speech and right facial droop - Seen by neurology, suspected left leg weakness is due to small stroke few months ago -MRI brain without any acute abnormality, chronic ischemic changes, remote lacunar infarcts -CTA head and neck stable from 11/25, bulky calcified plaque about L carotid bulb/proximal L ICA with severe 80% stenosis by NASCET criteria - occluded L vertebral artery continue with eliquis  per neurology PT/OT/SLP Needs outpatient neurology follow up  Acute kidney injury - BUN/creatinine elevated 62/1.75 -Improved after holding Lasix  and giving IV fluids -Today creatinine is 50/1.16   Bradycardia - Heart rate has been in low 50s - Will cut down the dose of metoprolol  to 12.5 mg p.o. twice daily    Left lower extremity weakness  neurology suspects the L leg weakness is related to a  small stroke a few months ago  MRI of C/T/L spine without findings that look like they're related, neurology notes MRI spine does not convincingly explain L leg weakness per neuro will need out patient EMG/NCS PT/OT   Degenerative Disc Disease Mild to moderate Diffuse Spinal Stenosis at C2-3 through C6-7 Disc Bulge with Facet Hypertrophy at L4-5 with L worse than R Lateral Recess Stenosis with moderate L worse than R L4 Foraminal Narrowing  Disc Protrusion at L3-4  Bilateral Facet Hypertrophy at L3-4 through L5-S1 -consider ortho/spine consult referral outpatient -pt/ot    Mild hyperkalemia Resolved   HFpEF Euvolemic on exam metoprolol /cozaar /lasix   - Dose of Lasix  changed to 40 mg p.o. daily due to soft blood pressure   DMII SSI lantus  18 units CBG well-controlled   Gout allopurinol     P Afib continue on eliquis  currently in sinus    HTN Losartan , metoprolol  - Blood pressure is elevated, will add amlodipine  5 mg daily    OSA cpap at bedtime    CKDIIIa -at baseline      BPH  -continue finasteride     HLD -continue statin       DVT prophylaxis: Apixaban   Medications     allopurinol   300 mg Oral q AM   apixaban   5 mg Oral BID   aspirin   81 mg Oral Daily   atorvastatin   80 mg Oral QHS   finasteride   5 mg Oral QPM   insulin  aspart  0-5 Units Subcutaneous QHS   insulin  aspart  0-9  Units Subcutaneous TID WC   insulin  glargine  18 Units Subcutaneous QHS   losartan   100 mg Oral Daily   metoprolol  tartrate  12.5 mg Oral BID   pantoprazole   40 mg Oral Daily   thiamine   100 mg Oral Daily   traZODone   25 mg Oral QHS     Data Reviewed:   CBG:  Recent Labs  Lab 11/19/24 1111 11/19/24 1649 11/19/24 2144 11/20/24 0617 11/20/24 0909  GLUCAP 180* 104* 157* 132* 168*    SpO2: 98 %    Vitals:   11/19/24 2003 11/19/24 2355 11/20/24 0337 11/20/24 0826  BP: (!) 134/44 (!) 157/27 (!) 167/41 (!) 168/65  Pulse: (!) 51 (!) 50 (!) 52 (!) 51  Resp: 17  15 17 18   Temp: 97.7 F (36.5 C) 98 F (36.7 C) 98 F (36.7 C) (!) 97.5 F (36.4 C)  TempSrc:    Oral  SpO2: 97% 99% 100% 98%  Weight:      Height:          Data Reviewed:  Basic Metabolic Panel: Recent Labs  Lab 11/15/24 1945 11/18/24 0531 11/19/24 0151 11/20/24 0145  NA 144 138 139 142  K 4.0 4.2 4.0 4.1  CL 106 103 106 109  CO2 26 22 24 26   GLUCOSE 194* 154* 138* 147*  BUN 40* 62* 66* 50*  CREATININE 1.24 1.75* 1.48* 1.16  CALCIUM  10.1 9.5 9.3 9.3  MG 1.7  --   --   --     CBC: Recent Labs  Lab 11/15/24 1945  WBC 5.8  NEUTROABS 3.5  HGB 13.6  HCT 42.5  MCV 89.9  PLT 241    LFT Recent Labs  Lab 11/15/24 1945  AST 24  ALT 27  ALKPHOS 105  BILITOT 0.3  PROT 7.5  ALBUMIN 3.7     Antibiotics: Anti-infectives (From admission, onward)    None        CONSULTS   Code Status: Full code  Family Communication: Discussed with patient's sister, insurance company has denied patient going to skilled facility.  Family is going to appeal the decision    Subjective   Denies any complaints  Objective    Physical Examination:  Appears in no acute distress S1-S2, regular Lungs clear to auscultation bilaterally Abdomen is soft, nontender, no organomegaly          Raymond Gordon S Raymond Gordon   Raymond Gordon If 7PM-7AM, please contact night-coverage at [email or web address], Office  [phone]   11/20/2024, 10:25 AM  LOS: 4 days

## 2024-11-20 NOTE — Plan of Care (Signed)
  Problem: Education: Goal: Knowledge of disease or condition will improve Outcome: Progressing   Problem: Ischemic Stroke/TIA Tissue Perfusion: Goal: Complications of ischemic stroke/TIA will be minimized Outcome: Progressing   Problem: Nutrition: Goal: Risk of aspiration will decrease Outcome: Progressing

## 2024-11-20 NOTE — Progress Notes (Signed)
 Physical Therapy Treatment Patient Details Name: Raymond Gordon MRN: [id number] DOB: 1951-03-24 Today's Date: 11/20/2024   History of Present Illness Pt is a 73 y/o M who presented to [facility] ED with episode of slurred speech and RFD. MRI brain negative for acute findings. PMHx: DMII, HLD, gout, afib, HTN, CHF, OSA, CKDIII, anemia, dementia, BPH with chronic indwelling foley catheter.    PT Comments  Pt received in supine and agreeable to session. Pt requires increased time and cues due to impaired awareness and problem solving. Pt demonstrates instability in standing requiring up to mod A to correct LOB. Pt demonstrates shuffled steps with B feet dragging intermittently with pt not able to correct with cues. Pt able to perform static standing marches with focus on increased B foot clearance, however only minimally improves with max multimodal cues. Pt continues to benefit from PT services to progress toward functional mobility goals.    If plan is discharge home, recommend the following: A lot of help with bathing/dressing/bathroom;Assistance with cooking/housework;Assist for transportation;Help with stairs or ramp for entrance;A little help with walking and/or transfers;Supervision due to cognitive status;Direct supervision/assist for medications management   Can travel by private vehicle     Yes  Equipment Recommendations  None recommended by PT    Recommendations for Other Services       Precautions / Restrictions Precautions Precautions: Fall Recall of Precautions/Restrictions: Intact Precaution/Restrictions Comments: fearful of falling Restrictions Weight Bearing Restrictions Per Provider Order: No     Mobility  Bed Mobility Overal bed mobility: Needs Assistance Bed Mobility: Supine to Sit     Supine to sit: Contact guard, HOB elevated, Used rails     General bed mobility comments: increased time and heavily reliant on bed features due to increased difficulty with trunk  elevation.    Transfers Overall transfer level: Needs assistance Equipment used: Rolling walker (2 wheels) Transfers: Sit to/from Stand Sit to Stand: Min assist, From elevated surface, Contact guard assist           General transfer comment: STS from EOB with min A progressing to CGA from recliner with heavy reliance on BUE support for power up    Ambulation/Gait Ambulation/Gait assistance: Min assist, Mod assist Gait Distance (Feet): 10 Feet Assistive device: Rolling walker (2 wheels) Gait Pattern/deviations: Step-to pattern, Decreased step length - right, Decreased step length - left, Decreased stride length, Shuffle, Trunk flexed, Narrow base of support, Decreased weight shift to left Gait velocity: decreased     General Gait Details: Pt demonstrates very short steps R>L with poor foot clearance despite cues. 1 LOB requiring assist to correct. Cues for upright posture and RW proximity, but poor carryover   Stairs             Wheelchair Mobility     Tilt Bed    Modified Rankin (Stroke Patients Only)       Balance Overall balance assessment: Needs assistance Sitting-balance support: Feet supported, Bilateral upper extremity supported Sitting balance-Leahy Scale: Fair Sitting balance - Comments: supervision sitting EOB   Standing balance support: Bilateral upper extremity supported, During functional activity, Reliant on assistive device for balance Standing balance-Leahy Scale: Poor Standing balance comment: reliant on RW support and posterior bias intermittently. 1 LOB back to EOB during first standing trial and during ambulation requiring assist to correct                            Communication Communication Communication: No  apparent difficulties Factors Affecting Communication: Reduced clarity of speech  Cognition Arousal: Alert Behavior During Therapy: WFL for tasks assessed/performed   PT - Cognitive impairments: History of cognitive  impairments                       PT - Cognition Comments: delayed processing and impaired awareness requiring increased cues during session. Poor carryover of cues Following commands: Impaired Following commands impaired: Follows multi-step commands with increased time    Cueing Cueing Techniques: Verbal cues, Gestural cues  Exercises General Exercises - Lower Extremity Long Arc Quad: AROM, Both, 10 reps, Seated Hip Flexion/Marching: AROM, Both, Standing, 15 reps Other Exercises Other Exercises: x5 serial STS    General Comments General comments (skin integrity, edema, etc.): Pt with bowel incontinence requiring assist for pericare      Pertinent Vitals/Pain Pain Assessment Pain Assessment: No/denies pain     PT Goals (current goals can now be found in the care plan section) Acute Rehab PT Goals Patient Stated Goal: to return to independent ambulation, improve LLE strength PT Goal Formulation: With patient Time For Goal Achievement: 11/27/24 Progress towards PT goals: Progressing toward goals    Frequency    Min 2X/week       AM-PAC PT 6 Clicks Mobility   Outcome Measure  Help needed turning from your back to your side while in a flat bed without using bedrails?: A Little Help needed moving from lying on your back to sitting on the side of a flat bed without using bedrails?: A Little Help needed moving to and from a bed to a chair (including a wheelchair)?: A Little Help needed standing up from a chair using your arms (e.g., wheelchair or bedside chair)?: A Little Help needed to walk in hospital room?: A Lot Help needed climbing 3-5 steps with a railing? : A Lot 6 Click Score: 16    End of Session Equipment Utilized During Treatment: Gait belt Activity Tolerance: Patient tolerated treatment well Patient left: with call bell/phone within reach;in chair;with chair alarm set Nurse Communication: Mobility status PT Visit Diagnosis: Other abnormalities  of gait and mobility (R26.89);Muscle weakness (generalized) (M62.81);Unsteadiness on feet (R26.81)     Time: 8979-8951 PT Time Calculation (min) (ACUTE ONLY): 28 min  Charges:    $Gait Training: 8-22 mins $Therapeutic Activity: 8-22 mins PT General Charges $$ ACUTE PT VISIT: 1 Visit                    Darryle George, PTA Acute Rehabilitation Services Secure Chat Preferred  [phone]    Darryle George 11/20/2024, 11:24 AM

## 2024-11-21 DIAGNOSIS — G459 Transient cerebral ischemic attack, unspecified: Secondary | ICD-10-CM | POA: Diagnosis not present

## 2024-11-21 LAB — CBC
HCT: 40 % (ref 39.0–52.0)
Hemoglobin: 12.9 g/dL — ABNORMAL LOW (ref 13.0–17.0)
MCH: 29 pg (ref 26.0–34.0)
MCHC: 32.3 g/dL (ref 30.0–36.0)
MCV: 89.9 fL (ref 80.0–100.0)
Platelets: 292 K/uL (ref 150–400)
RBC: 4.45 MIL/uL (ref 4.22–5.81)
RDW: 15.8 % — ABNORMAL HIGH (ref 11.5–15.5)
WBC: 5.4 K/uL (ref 4.0–10.5)
nRBC: 0 % (ref 0.0–0.2)

## 2024-11-21 LAB — BASIC METABOLIC PANEL WITH GFR
Anion gap: 9 (ref 5–15)
BUN: 31 mg/dL — ABNORMAL HIGH (ref 8–23)
CO2: 24 mmol/L (ref 22–32)
Calcium: 9.7 mg/dL (ref 8.9–10.3)
Chloride: 111 mmol/L (ref 98–111)
Creatinine, Ser: 0.87 mg/dL (ref 0.61–1.24)
GFR, Estimated: >60 mL/min
Glucose, Bld: 73 mg/dL (ref 70–99)
Potassium: 4.7 mmol/L (ref 3.5–5.1)
Sodium: 144 mmol/L (ref 135–145)

## 2024-11-21 LAB — GLUCOSE, CAPILLARY
Glucose-Capillary: 79 mg/dL (ref 70–99)
Glucose-Capillary: 106 mg/dL — ABNORMAL HIGH (ref 70–99)
Glucose-Capillary: 98 mg/dL (ref 70–99)
Glucose-Capillary: 163 mg/dL — ABNORMAL HIGH (ref 70–99)
Glucose-Capillary: 100 mg/dL — ABNORMAL HIGH (ref 70–99)

## 2024-11-21 MED ORDER — LORAZEPAM 2 MG/ML IJ SOLN
1.0000 mg | INTRAMUSCULAR | Status: AC | PRN
  Administered 2024-11-21: 1 mg via INTRAVENOUS
  Filled 2024-11-21: qty 1

## 2024-11-21 MED ORDER — THIAMINE MONONITRATE 100 MG PO TABS
100.0000 mg | ORAL_TABLET | Freq: Every day | ORAL | Status: DC
  Administered 2024-11-22 – 2024-11-30 (×9): 100 mg via ORAL
  Filled 2024-11-21 (×2): qty 1

## 2024-11-21 NOTE — Plan of Care (Signed)
  Problem: Education: Goal: Knowledge of disease or condition will improve Outcome: Not Progressing Goal: Knowledge of secondary prevention will improve (MUST DOCUMENT ALL) Outcome: Not Progressing Goal: Knowledge of patient specific risk factors will improve (DELETE if not current risk factor) Outcome: Not Progressing   Problem: Ischemic Stroke/TIA Tissue Perfusion: Goal: Complications of ischemic stroke/TIA will be minimized Outcome: Not Progressing   Problem: Coping: Goal: Will verbalize positive feelings about self Outcome: Not Progressing Goal: Will identify appropriate support needs Outcome: Not Progressing   Problem: Health Behavior/Discharge Planning: Goal: Ability to manage health-related needs will improve Outcome: Not Progressing Goal: Goals will be collaboratively established with patient/family Outcome: Not Progressing   Problem: Self-Care: Goal: Ability to participate in self-care as condition permits will improve Outcome: Not Progressing Goal: Verbalization of feelings and concerns over difficulty with self-care will improve Outcome: Not Progressing Goal: Ability to communicate needs accurately will improve Outcome: Not Progressing   Problem: Nutrition: Goal: Risk of aspiration will decrease Outcome: Not Progressing Goal: Dietary intake will improve Outcome: Not Progressing   Problem: Education: Goal: Ability to describe self-care measures that may prevent or decrease complications (Diabetes Survival Skills Education) will improve Outcome: Not Progressing Goal: Individualized Educational Video(s) Outcome: Not Progressing   Problem: Coping: Goal: Ability to adjust to condition or change in health will improve Outcome: Not Progressing   Problem: Fluid Volume: Goal: Ability to maintain a balanced intake and output will improve Outcome: Not Progressing   Problem: Health Behavior/Discharge Planning: Goal: Ability to identify and utilize available  resources and services will improve Outcome: Not Progressing Goal: Ability to manage health-related needs will improve Outcome: Not Progressing   Problem: Metabolic: Goal: Ability to maintain appropriate glucose levels will improve Outcome: Not Progressing   Problem: Nutritional: Goal: Maintenance of adequate nutrition will improve Outcome: Not Progressing Goal: Progress toward achieving an optimal weight will improve Outcome: Not Progressing   Problem: Skin Integrity: Goal: Risk for impaired skin integrity will decrease Outcome: Not Progressing   Problem: Tissue Perfusion: Goal: Adequacy of tissue perfusion will improve Outcome: Not Progressing   Problem: Education: Goal: Knowledge of General Education information will improve Description: Including pain rating scale, medication(s)/side effects and non-pharmacologic comfort measures Outcome: Not Progressing   Problem: Health Behavior/Discharge Planning: Goal: Ability to manage health-related needs will improve Outcome: Not Progressing   Problem: Clinical Measurements: Goal: Ability to maintain clinical measurements within normal limits will improve Outcome: Not Progressing Goal: Will remain free from infection Outcome: Not Progressing Goal: Diagnostic test results will improve Outcome: Not Progressing Goal: Respiratory complications will improve Outcome: Not Progressing Goal: Cardiovascular complication will be avoided Outcome: Not Progressing   Problem: Activity: Goal: Risk for activity intolerance will decrease Outcome: Not Progressing   Problem: Nutrition: Goal: Adequate nutrition will be maintained Outcome: Not Progressing   Problem: Coping: Goal: Level of anxiety will decrease Outcome: Not Progressing   Problem: Elimination: Goal: Will not experience complications related to bowel motility Outcome: Not Progressing Goal: Will not experience complications related to urinary retention Outcome: Not  Progressing   Problem: Pain Managment: Goal: General experience of comfort will improve and/or be controlled Outcome: Not Progressing   Problem: Safety: Goal: Ability to remain free from injury will improve Outcome: Not Progressing   Problem: Skin Integrity: Goal: Risk for impaired skin integrity will decrease Outcome: Not Progressing

## 2024-11-21 NOTE — Plan of Care (Signed)
" °  Problem: Education: Goal: Knowledge of disease or condition will improve 11/21/2024 0702 by Sosaia Pittinger S, RN Outcome: Not Progressing 11/21/2024 0523 by Curly Venetia RAMAN, RN Outcome: Not Progressing Goal: Knowledge of secondary prevention will improve (MUST DOCUMENT ALL) 11/21/2024 0702 by Curly Venetia RAMAN, RN Outcome: Not Progressing 11/21/2024 0523 by Curly Venetia RAMAN, RN Outcome: Not Progressing Goal: Knowledge of patient specific risk factors will improve (DELETE if not current risk factor) 11/21/2024 0702 by Aniken Monestime S, RN Outcome: Not Progressing 11/21/2024 0523 by Curly Venetia RAMAN, RN Outcome: Not Progressing   Problem: Ischemic Stroke/TIA Tissue Perfusion: Goal: Complications of ischemic stroke/TIA will be minimized 11/21/2024 0702 by Aidynn Polendo S, RN Outcome: Not Progressing 11/21/2024 0523 by Curly Venetia RAMAN, RN Outcome: Not Progressing   Problem: Health Behavior/Discharge Planning: Goal: Ability to manage health-related needs will improve Outcome: Not Progressing   "

## 2024-11-21 NOTE — Plan of Care (Signed)
" °  Problem: Education: Goal: Knowledge of disease or condition will improve Outcome: Not Progressing Goal: Knowledge of secondary prevention will improve (MUST DOCUMENT ALL) Outcome: Not Progressing Goal: Knowledge of patient specific risk factors will improve (DELETE if not current risk factor) Outcome: Not Progressing   Problem: Ischemic Stroke/TIA Tissue Perfusion: Goal: Complications of ischemic stroke/TIA will be minimized Outcome: Not Progressing   Problem: Health Behavior/Discharge Planning: Goal: Ability to manage health-related needs will improve Outcome: Not Progressing   "

## 2024-11-21 NOTE — Progress Notes (Signed)
 " PROGRESS NOTE    Raymond Gordon  FMW:[id number] DOB: 09-14-1951 DOA: 11/12/2024 PCP: [name], MD  Chief Complaint  Patient presents with   Stroke Symptoms    Brief Narrative:    73 y.o. male with medical history significant of DMII, Gout, afib, HTN, OSA, Gout, CKD IIIa, Anemia of chronic disease, Dementia, BPH chronic indwelling foley, HLD, Hfpef.    He was found by family, unable to get up from bathroom.  L leg weakness and L arm weakness noted.    Assessment & Plan:   Principal Problem:   TIA (transient ischemic attack)  Acute Metabolic Encephalopathy Delirium  - Patient gets confused usually around 5 PM, although no agitation - Although workup has been negative - thiamine  level wnl, b12 level wnl, TSH wnl - follow ammonia, venous blood gas - MRI brain on 11/12/2024 was unremarkable - hold further trazodone  due to lethargy this AM - continue nightly CPAP -EEG was negative for seizure or epileptiform discharges. -UA not concerning for UTI.  -neurology signed off 12/23   TIA Stroke Like Symptoms Presented with episode of slurred speech and right facial droop Seen by neurology, suspected left leg weakness is due to small stroke few months ago MRI brain without any acute abnormality, chronic ischemic changes, remote lacunar infarcts CTA head and neck stable from 11/25, bulky calcified plaque about L carotid bulb/proximal L ICA with severe 80% stenosis by NASCET criteria - occluded L vertebral artery continue with eliquis  per neurology PT/OT/SLP Needs outpatient neurology follow up   Acute kidney injury - resolved   Bradycardia - Heart rate has been in low 50s - holding parameters for metop   Left lower extremity weakness  neurology suspects the L leg weakness is related to Taria Castrillo small stroke Chamberlain Steinborn few months ago  MRI of C/T/L spine without findings that look like they're related, neurology notes MRI spine does not convincingly explain L leg weakness per neuro  will need outpatient EMG/NCS PT/OT   Degenerative Disc Disease Mild to moderate Diffuse Spinal Stenosis at C2-3 through C6-7 Disc Bulge with Facet Hypertrophy at L4-5 with L worse than R Lateral Recess Stenosis with moderate L worse than R L4 Foraminal Narrowing  Disc Protrusion at L3-4  Bilateral Facet Hypertrophy at L3-4 through L5-S1 -consider ortho/spine consult referral outpatient -pt/ot    Mild hyperkalemia Resolved   HFpEF Euvolemic on exam metoprolol /cozaar  Lasix  currently on hold   DMII SSI lantus  18 units CBG well-controlled   Gout allopurinol     P Afib continue on eliquis   HTN Losartan , metoprolol  Amlodipine  added here    CKDIIIa -at baseline    BPH  -continue finasteride     HLD -continue statin    OSA cpap at bedtime  Class II Obesity Body mass index is 39.32 kg/m.    DVT prophylaxis: eliquis  Code Status: full Family Communication: none Disposition:   Status is: Inpatient Remains inpatient appropriate because: need for continued inpatient care   Consultants:  neurology  Procedures:  Echo IMPRESSIONS     1. Left ventricular ejection fraction, by estimation, is 55 to 60%. The  left ventricle has normal function. The left ventricle has no regional  wall motion abnormalities. There is mild concentric left ventricular  hypertrophy. Left ventricular diastolic  parameters are consistent with Grade I diastolic dysfunction (impaired  relaxation).   2. Right ventricular systolic function is normal. The right ventricular  size is normal. Tricuspid regurgitation signal is inadequate for assessing  PA pressure.   3.  The mitral valve is normal in structure. No evidence of mitral valve  regurgitation. No evidence of mitral stenosis.   4. The aortic valve is normal in structure. Aortic valve regurgitation is  not visualized. No aortic stenosis is present.   5. The inferior vena cava is normal in size with greater than 50%  respiratory  variability, suggesting right atrial pressure of 3 mmHg.   Antimicrobials:  Anti-infectives (From admission, onward)    None       Subjective: 3 sisters at bedside No complaints today  Objective: Vitals:   11/21/24 0500 11/21/24 1054 11/21/24 1102 11/21/24 1216  BP: (!) 88/65 (!) 122/29 (!) 139/43 (!) 160/66  Pulse: 72  (!) 51 (!) 55  Resp:      Temp: 98.2 F (36.8 C)   97.6 F (36.4 C)  TempSrc: Axillary   Oral  SpO2: 97%   100%  Weight:      Height:        Intake/Output Summary (Last 24 hours) at 11/21/2024 1752 Last data filed at 11/21/2024 1056 Gross per 24 hour  Intake 240 ml  Output --  Net 240 ml   Filed Weights   11/15/24 1119  Weight: 103.9 kg    Examination:  General exam: Appears calm and comfortable  Respiratory system: unlabored Cardiovascular system: RRR Gastrointestinal system: Abdomen is nondistended, soft and nontender.  Central nervous system: alert, mildly lethargic today.  Symmetric strength.  LLE seems improved from when I first met him.  Extremities: no LEE   Data Reviewed: I have personally reviewed following labs and imaging studies  CBC: Recent Labs  Lab 11/15/24 1945 11/21/24 0928  WBC 5.8 5.4  NEUTROABS 3.5  --   HGB 13.6 12.9*  HCT 42.5 40.0  MCV 89.9 89.9  PLT 241 292    Basic Metabolic Panel: Recent Labs  Lab 11/15/24 1945 11/18/24 0531 11/19/24 0151 11/20/24 0145 11/21/24 0928  NA 144 138 139 142 144  K 4.0 4.2 4.0 4.1 4.7  CL 106 103 106 109 111  CO2 26 22 24 26 24   GLUCOSE 194* 154* 138* 147* 73  BUN 40* 62* 66* 50* 31*  CREATININE 1.24 1.75* 1.48* 1.16 0.87  CALCIUM  10.1 9.5 9.3 9.3 9.7  MG 1.7  --   --   --   --     GFR: Estimated Creatinine Clearance: 81.2 mL/min (by C-G formula based on SCr of 0.87 mg/dL).  Liver Function Tests: Recent Labs  Lab 11/15/24 1945  AST 24  ALT 27  ALKPHOS 105  BILITOT 0.3  PROT 7.5  ALBUMIN 3.7    CBG: Recent Labs  Lab 11/20/24 2118 11/21/24 0800  11/21/24 1229 11/21/24 1255 11/21/24 1649  GLUCAP 97 79 106* 98 163*     No results found for this or any previous visit (from the past 240 hours).       Radiology Studies: No results found.      Scheduled Meds:  allopurinol   300 mg Oral q AM   amLODipine   5 mg Oral Daily   apixaban   5 mg Oral BID   aspirin   81 mg Oral Daily   atorvastatin   80 mg Oral QHS   finasteride   5 mg Oral QPM   insulin  aspart  0-5 Units Subcutaneous QHS   insulin  aspart  0-9 Units Subcutaneous TID WC   insulin  glargine  18 Units Subcutaneous QHS   losartan   100 mg Oral Daily   metoprolol  tartrate  12.5 mg  Oral BID   pantoprazole   40 mg Oral Daily   thiamine   100 mg Oral Daily   Continuous Infusions:   LOS: 5 days    Time spent: over 30 min     Meliton Monte, MD [facility]   To contact the attending provider between 7A-7P or the covering provider during after hours 7P-7A, please log into the web site [email or web address] and access using universal [place] password for that web site. If you do not have the password, please call the hospital operator.  11/21/2024, 5:52 PM    "

## 2024-11-22 DIAGNOSIS — G459 Transient cerebral ischemic attack, unspecified: Secondary | ICD-10-CM | POA: Diagnosis not present

## 2024-11-22 LAB — PHOSPHORUS: Phosphorus: 3.2 mg/dL (ref 2.5–4.6)

## 2024-11-22 LAB — AMMONIA: Ammonia: 22 umol/L (ref 9–35)

## 2024-11-22 LAB — COMPREHENSIVE METABOLIC PANEL WITH GFR
ALT: 21 U/L (ref 0–44)
AST: 24 U/L (ref 15–41)
Albumin: 3.4 g/dL — ABNORMAL LOW (ref 3.5–5.0)
Alkaline Phosphatase: 101 U/L (ref 38–126)
Anion gap: 8 (ref 5–15)
BUN: 34 mg/dL — ABNORMAL HIGH (ref 8–23)
CO2: 25 mmol/L (ref 22–32)
Calcium: 9.5 mg/dL (ref 8.9–10.3)
Chloride: 110 mmol/L (ref 98–111)
Creatinine, Ser: 1.25 mg/dL — ABNORMAL HIGH (ref 0.61–1.24)
GFR, Estimated: >60 mL/min
Glucose, Bld: 115 mg/dL — ABNORMAL HIGH (ref 70–99)
Potassium: 4.7 mmol/L (ref 3.5–5.1)
Sodium: 143 mmol/L (ref 135–145)
Total Bilirubin: 0.3 mg/dL (ref 0.0–1.2)
Total Protein: 7.3 g/dL (ref 6.5–8.1)

## 2024-11-22 LAB — CBC WITH DIFFERENTIAL/PLATELET
Abs Immature Granulocytes: 0.01 K/uL (ref 0.00–0.07)
Basophils Absolute: 0 K/uL (ref 0.0–0.1)
Basophils Relative: 1 %
Eosinophils Absolute: 0.1 K/uL (ref 0.0–0.5)
Eosinophils Relative: 2 %
HCT: 40 % (ref 39.0–52.0)
Hemoglobin: 12.8 g/dL — ABNORMAL LOW (ref 13.0–17.0)
Immature Granulocytes: 0 %
Lymphocytes Relative: 35 %
Lymphs Abs: 2.1 K/uL (ref 0.7–4.0)
MCH: 29 pg (ref 26.0–34.0)
MCHC: 32 g/dL (ref 30.0–36.0)
MCV: 90.5 fL (ref 80.0–100.0)
Monocytes Absolute: 0.5 K/uL (ref 0.1–1.0)
Monocytes Relative: 8 %
Neutro Abs: 3.2 K/uL (ref 1.7–7.7)
Neutrophils Relative %: 54 %
Platelets: 267 K/uL (ref 150–400)
RBC: 4.42 MIL/uL (ref 4.22–5.81)
RDW: 15.9 % — ABNORMAL HIGH (ref 11.5–15.5)
WBC: 5.9 K/uL (ref 4.0–10.5)
nRBC: 0 % (ref 0.0–0.2)

## 2024-11-22 LAB — BLOOD GAS, VENOUS
Acid-Base Excess: 2.2 mmol/L — ABNORMAL HIGH (ref 0.0–2.0)
Bicarbonate: 27.8 mmol/L (ref 20.0–28.0)
Drawn by: 59174
O2 Saturation: 88.3 %
Patient temperature: 36.4
pCO2, Ven: 45 mmHg (ref 44–60)
pH, Ven: 7.4 (ref 7.25–7.43)
pO2, Ven: 54 mmHg — ABNORMAL HIGH (ref 32–45)

## 2024-11-22 LAB — GLUCOSE, CAPILLARY
Glucose-Capillary: 91 mg/dL (ref 70–99)
Glucose-Capillary: 137 mg/dL — ABNORMAL HIGH (ref 70–99)
Glucose-Capillary: 163 mg/dL — ABNORMAL HIGH (ref 70–99)
Glucose-Capillary: 162 mg/dL — ABNORMAL HIGH (ref 70–99)

## 2024-11-22 LAB — MAGNESIUM: Magnesium: 2.3 mg/dL (ref 1.7–2.4)

## 2024-11-22 NOTE — TOC Progression Note (Addendum)
 Transition of Care [facility]) - Progression Note    Patient Details  Name: KOLLIN UDELL MRN: [id number] Date of Birth: 12/11/1950  Transition of Care [facility]) CM/SW Contact  [name], [name] Phone Number: 11/22/2024, 11:26 AM  Clinical Narrative:   CSW spoke with [employer] to request copy of denial letter, as it has not yet been received. [employer] emailed the denial letter over, and CSW confirmed receipt. CSW contacted patient and sister, [name], to provide update on reason for denial (that patient was at his baseline), and both agreed that was not accurate. CSW completed expedited appeal request and sent in for review, awaiting response. MD provided update.  UPDATE 11:55 am: CSW contacted by [facility] [employer] that appeal was not valid as it was not submitted on [facility] letterhead. CSW compiled appeal request again on [facility] letterhead and faxed to [facility] [employer] per their request. CSW to follow.    Expected Discharge Plan: Skilled Nursing Facility Barriers to Discharge: Insurance Authorization               Expected Discharge Plan and Services                                               Social Drivers of Health (SDOH) Interventions SDOH Screenings   Food Insecurity: No Food Insecurity (11/13/2024)  Housing: Low Risk (11/13/2024)  Transportation Needs: Unmet Transportation Needs (11/13/2024)  Utilities: Not At Risk (11/13/2024)  Social Connections: Moderately Isolated (11/13/2024)  Tobacco Use: Medium Risk (11/13/2024)    Readmission Risk Interventions     No data to display

## 2024-11-22 NOTE — Progress Notes (Signed)
 Occupational Therapy Treatment Patient Details Name: Raymond Gordon MRN: [id number] DOB: 1951/09/11 Today's Date: 11/22/2024   History of present illness Pt is a 73 y/o M who presented to [facility] ED with episode of slurred speech and RFD. MRI brain negative for acute findings. PMHx: DMII, HLD, gout, afib, HTN, CHF, OSA, CKDIII, anemia, dementia, BPH with chronic indwelling foley catheter.   OT comments  Patient received in recliner and agreeable to OT treatment. Patient ambulating to sink from recliner with min assist for sit to stand and chair follow due to patient requiring seated breaks and quickly fatigues. Patient stood at sink for grooming task and required seated rest break following. Patient performed toilet transfer with min assist and cues for safety with patient with mild LOB when reaching for grab bar. Patient able to perform toilet hygiene while standing with mod assist to complete. Patient asking to go to bed at end of session with min assist.  Patient will benefit from continued inpatient follow up therapy, <3 hours/day.  Acute OT to continue to follow to address established goals to facilitate DC to next venue of care.        If plan is discharge home, recommend the following:  Assistance with cooking/housework;Direct supervision/assist for medications management;Direct supervision/assist for financial management;Assist for transportation;Help with stairs or ramp for entrance;A little help with walking and/or transfers;A lot of help with bathing/dressing/bathroom   Equipment Recommendations  Other (comment) (defer)    Recommendations for Other Services      Precautions / Restrictions Precautions Precautions: Fall Recall of Precautions/Restrictions: Intact Precaution/Restrictions Comments: fearful of falling Restrictions Weight Bearing Restrictions Per Provider Order: No       Mobility Bed Mobility Overal bed mobility: Needs Assistance Bed Mobility: Sit to Supine        Sit to supine: Min assist   General bed mobility comments: assistance with LLE    Transfers Overall transfer level: Needs assistance Equipment used: Rolling walker (2 wheels) Transfers: Sit to/from Stand Sit to Stand: Min assist, From elevated surface           General transfer comment: min assist for sit to stand and occasional min assist for balance due to patient fearful of falling     Balance Overall balance assessment: Needs assistance Sitting-balance support: Feet supported, Bilateral upper extremity supported Sitting balance-Leahy Scale: Fair Sitting balance - Comments: supervision sitting EOB   Standing balance support: Single extremity supported, Bilateral upper extremity supported, During functional activity Standing balance-Leahy Scale: Poor Standing balance comment: reliant on at least one extremity support for static standing                           ADL either performed or assessed with clinical judgement   ADL Overall ADL's : Needs assistance/impaired     Grooming: Wash/dry hands;Wash/dry face;Oral care;Contact guard assist;Standing Grooming Details (indicate cue type and reason): at sink, required seated rest break following                 Toilet Transfer: Minimal assistance;Regular Toilet;Rolling walker (2 wheels);Grab bars   Toileting- Clothing Manipulation and Hygiene: Moderate assistance;Sit to/from stand Toileting - Clothing Manipulation Details (indicate cue type and reason): mod assist to complete toilet hygiene            Extremity/Trunk Assessment              Vision       Perception     Praxis  Communication Communication Communication: No apparent difficulties Factors Affecting Communication: Reduced clarity of speech   Cognition Arousal: Alert Behavior During Therapy: WFL for tasks assessed/performed Cognition: No apparent impairments             OT - Cognition Comments: continues to display  fear of falling during mobility                 Following commands: Impaired Following commands impaired: Follows multi-step commands with increased time      Cueing   Cueing Techniques: Verbal cues, Gestural cues  Exercises      Shoulder Instructions       General Comments patient requiring frequent rest breaks during mobility and self care    Pertinent Vitals/ Pain       Pain Assessment Pain Assessment: No/denies pain  Home Living                                          Prior Functioning/Environment              Frequency  Min 2X/week        Progress Toward Goals  OT Goals(current goals can now be found in the care plan section)  Progress towards OT goals: Progressing toward goals     Plan      Co-evaluation                 AM-PAC OT 6 Clicks Daily Activity     Outcome Measure   Help from another person eating meals?: None Help from another person taking care of personal grooming?: A Little Help from another person toileting, which includes using toliet, bedpan, or urinal?: A Lot Help from another person bathing (including washing, rinsing, drying)?: A Lot Help from another person to put on and taking off regular upper body clothing?: A Little Help from another person to put on and taking off regular lower body clothing?: A Lot 6 Click Score: 16    End of Session Equipment Utilized During Treatment: Gait belt;Rolling walker (2 wheels)  OT Visit Diagnosis: Unsteadiness on feet (R26.81);History of falling (Z91.81);Muscle weakness (generalized) (M62.81)   Activity Tolerance Patient tolerated treatment well   Patient Left in bed;with call bell/phone within reach;with bed alarm set   Nurse Communication Mobility status        Time: 1340-1417 OT Time Calculation (min): 37 min  Charges: OT General Charges $OT Visit: 1 Visit OT Treatments $Self Care/Home Management : 23-37 mins  Dick Laine, OTA Acute  Rehabilitation Services  Office [phone]   Jeb LITTIE Laine 11/22/2024, 2:43 PM

## 2024-11-22 NOTE — Progress Notes (Signed)
 " PROGRESS NOTE    Raymond Gordon  FMW:[id number] DOB: 1951/05/26 DOA: 11/12/2024 PCP: [name], MD  Chief Complaint  Patient presents with   Stroke Symptoms    Brief Narrative:    73 y.o. male with medical history significant of DMII, Gout, afib, HTN, OSA, Gout, CKD IIIa, Anemia of chronic disease, Dementia, BPH chronic indwelling foley, HLD, Hfpef.    He was found by family, unable to get up from bathroom.  L leg weakness and L arm weakness noted.    Assessment & Plan:   Principal Problem:   TIA (transient ischemic attack)  Acute Metabolic Encephalopathy Delirium  - Patient gets confused usually around 5 PM, although no agitation - Although workup has been negative - thiamine  level wnl, b12 level wnl, TSH wnl - follow ammonia wnl, venous blood gas (without hypercarbia) - MRI brain on 11/12/2024 was unremarkable - hold further trazodone  due to lethargy noted 12/28 AM - continue nightly CPAP -EEG was negative for seizure or epileptiform discharges. -UA not concerning for UTI.  -neurology signed off 12/23   TIA Stroke Like Symptoms Presented with episode of slurred speech and right facial droop Seen by neurology, suspected left leg weakness is due to small stroke few months ago MRI brain without any acute abnormality, chronic ischemic changes, remote lacunar infarcts CTA head and neck stable from 11/25, bulky calcified plaque about L carotid bulb/proximal L ICA with severe 80% stenosis by NASCET criteria - occluded L vertebral artery continue with eliquis  per neurology PT/OT/SLP Needs outpatient neurology follow up   Acute kidney injury - resolved   Bradycardia - Heart rate has been in low 50s - hold metop    Left lower extremity weakness  neurology suspects the L leg weakness is related to Sumeet Geter small stroke Ally Knodel few months ago  MRI of C/T/L spine without findings that look like they're related, neurology notes MRI spine does not convincingly explain L leg  weakness per neuro will need outpatient EMG/NCS PT/OT   Degenerative Disc Disease Mild to moderate Diffuse Spinal Stenosis at C2-3 through C6-7 Disc Bulge with Facet Hypertrophy at L4-5 with L worse than R Lateral Recess Stenosis with moderate L worse than R L4 Foraminal Narrowing  Disc Protrusion at L3-4  Bilateral Facet Hypertrophy at L3-4 through L5-S1 -consider ortho/spine consult referral outpatient -pt/ot    Mild hyperkalemia Resolved   HFpEF Euvolemic on exam metoprolol /cozaar  Lasix  currently on hold   DMII SSI lantus  18 units CBG well-controlled   Gout allopurinol     P Afib continue on eliquis   HTN Losartan  Amlodipine  added here Metoprolol  on hold due to bradycardia above  CKDIIIa -at baseline    BPH  -continue finasteride     HLD -continue statin    OSA cpap at bedtime  Class II Obesity Body mass index is 39.32 kg/m.    DVT prophylaxis: eliquis  Code Status: full Family Communication: none Disposition:   Status is: Inpatient Remains inpatient appropriate because: need for continued inpatient care   Consultants:  neurology  Procedures:  Echo IMPRESSIONS     1. Left ventricular ejection fraction, by estimation, is 55 to 60%. The  left ventricle has normal function. The left ventricle has no regional  wall motion abnormalities. There is mild concentric left ventricular  hypertrophy. Left ventricular diastolic  parameters are consistent with Grade I diastolic dysfunction (impaired  relaxation).   2. Right ventricular systolic function is normal. The right ventricular  size is normal. Tricuspid regurgitation signal is inadequate for  assessing  PA pressure.   3. The mitral valve is normal in structure. No evidence of mitral valve  regurgitation. No evidence of mitral stenosis.   4. The aortic valve is normal in structure. Aortic valve regurgitation is  not visualized. No aortic stenosis is present.   5. The inferior vena cava is  normal in size with greater than 50%  respiratory variability, suggesting right atrial pressure of 3 mmHg.   Antimicrobials:  Anti-infectives (From admission, onward)    None       Subjective: No complaints today  Objective: Vitals:   11/22/24 0441 11/22/24 0902 11/22/24 1006 11/22/24 1233  BP: (!) 102/54 (!) 146/46 (!) 146/46 (!) 142/44  Pulse: (!) 48 (!) 51  (!) 54  Resp: 18 18  18   Temp: 98 F (36.7 C) (!) 97.5 F (36.4 C)  (!) 97.5 F (36.4 C)  TempSrc:  Oral  Oral  SpO2: 100% 100%  97%  Weight:      Height:       No intake or output data in the 24 hours ending 11/22/24 1610  Filed Weights   11/15/24 1119  Weight: 103.9 kg    Examination:  General: No acute distress. Cardiovascular: RRR Lungs:unlabored Neurological: Alert. Symmetric lower extremity strength. Extremities: No clubbing or cyanosis. No edema.  Data Reviewed: I have personally reviewed following labs and imaging studies  CBC: Recent Labs  Lab 11/15/24 1945 11/21/24 0928 11/22/24 0148  WBC 5.8 5.4 5.9  NEUTROABS 3.5  --  3.2  HGB 13.6 12.9* 12.8*  HCT 42.5 40.0 40.0  MCV 89.9 89.9 90.5  PLT 241 292 267    Basic Metabolic Panel: Recent Labs  Lab 11/15/24 1945 11/18/24 0531 11/19/24 0151 11/20/24 0145 11/21/24 0928 11/22/24 0148  NA 144 138 139 142 144 143  K 4.0 4.2 4.0 4.1 4.7 4.7  CL 106 103 106 109 111 110  CO2 26 22 24 26 24 25   GLUCOSE 194* 154* 138* 147* 73 115*  BUN 40* 62* 66* 50* 31* 34*  CREATININE 1.24 1.75* 1.48* 1.16 0.87 1.25*  CALCIUM  10.1 9.5 9.3 9.3 9.7 9.5  MG 1.7  --   --   --   --  2.3  PHOS  --   --   --   --   --  3.2    GFR: Estimated Creatinine Clearance: 56.5 mL/min (Juanelle Trueheart) (by C-G formula based on SCr of 1.25 mg/dL (H)).  Liver Function Tests: Recent Labs  Lab 11/15/24 1945 11/22/24 0148  AST 24 24  ALT 27 21  ALKPHOS 105 101  BILITOT 0.3 0.3  PROT 7.5 7.3  ALBUMIN 3.7 3.4*    CBG: Recent Labs  Lab 11/21/24 1255 11/21/24 1649  11/21/24 2222 11/22/24 0612 11/22/24 1233  GLUCAP 98 163* 100* 91 137*     No results found for this or any previous visit (from the past 240 hours).       Radiology Studies: No results found.      Scheduled Meds:  allopurinol   300 mg Oral q AM   amLODipine   5 mg Oral Daily   apixaban   5 mg Oral BID   aspirin   81 mg Oral Daily   atorvastatin   80 mg Oral QHS   finasteride   5 mg Oral QPM   insulin  aspart  0-5 Units Subcutaneous QHS   insulin  aspart  0-9 Units Subcutaneous TID WC   insulin  glargine  18 Units Subcutaneous QHS   losartan   100  mg Oral Daily   pantoprazole   40 mg Oral Daily   thiamine   100 mg Oral Daily   Continuous Infusions:   LOS: 6 days    Time spent: over 30 min     Meliton Monte, MD [facility]   To contact the attending provider between 7A-7P or the covering provider during after hours 7P-7A, please log into the web site [email or web address] and access using universal [place] password for that web site. If you do not have the password, please call the hospital operator.  11/22/2024, 4:10 PM    "

## 2024-11-22 NOTE — Plan of Care (Signed)
" °  Problem: Education: Goal: Knowledge of disease or condition will improve Outcome: Not Progressing Goal: Knowledge of secondary prevention will improve (MUST DOCUMENT ALL) Outcome: Not Progressing Goal: Knowledge of patient specific risk factors will improve (DELETE if not current risk factor) Outcome: Not Progressing   Problem: Ischemic Stroke/TIA Tissue Perfusion: Goal: Complications of ischemic stroke/TIA will be minimized Outcome: Not Progressing   Problem: Health Behavior/Discharge Planning: Goal: Ability to manage health-related needs will improve Outcome: Not Progressing   Problem: Health Behavior/Discharge Planning: Goal: Ability to manage health-related needs will improve Outcome: Not Progressing   "

## 2024-11-22 NOTE — Plan of Care (Signed)
 OOB to chair with NT and stedy. Foods and fluids well tolerated. Able to verbalize needs.    Problem: Education: Goal: Knowledge of disease or condition will improve Outcome: Progressing   Problem: Coping: Goal: Will verbalize positive feelings about self Outcome: Progressing   Problem: Nutrition: Goal: Risk of aspiration will decrease Outcome: Progressing   Problem: Skin Integrity: Goal: Risk for impaired skin integrity will decrease Outcome: Progressing   Problem: Activity: Goal: Risk for activity intolerance will decrease Outcome: Progressing   Problem: Safety: Goal: Ability to remain free from injury will improve Outcome: Progressing

## 2024-11-23 DIAGNOSIS — G459 Transient cerebral ischemic attack, unspecified: Secondary | ICD-10-CM | POA: Diagnosis not present

## 2024-11-23 LAB — GLUCOSE, CAPILLARY
Glucose-Capillary: 134 mg/dL — ABNORMAL HIGH (ref 70–99)
Glucose-Capillary: 152 mg/dL — ABNORMAL HIGH (ref 70–99)
Glucose-Capillary: 130 mg/dL — ABNORMAL HIGH (ref 70–99)
Glucose-Capillary: 148 mg/dL — ABNORMAL HIGH (ref 70–99)

## 2024-11-23 NOTE — Progress Notes (Signed)
 " PROGRESS NOTE    Raymond Gordon  FMW:[id number] DOB: 10-07-1951 DOA: 11/12/2024 PCP: [name], MD  Chief Complaint  Patient presents with   Stroke Symptoms    Brief Narrative:    73 y.o. male with medical history significant of DMII, Gout, afib, HTN, OSA, Gout, CKD IIIa, Anemia of chronic disease, Dementia, BPH chronic indwelling foley, HLD, Hfpef.    He was found by family, unable to get up from bathroom.  L leg weakness and L arm weakness noted.    Assessment & Plan:   Principal Problem:   TIA (transient ischemic attack)  Acute Metabolic Encephalopathy Delirium  - Patient gets confused usually around 5 PM, although no agitation - Although workup has been negative - thiamine  level wnl, b12 level wnl, TSH wnl - follow ammonia wnl, venous blood gas (without hypercarbia) - MRI brain on 11/12/2024 was unremarkable - hold further trazodone  due to lethargy noted 12/28 AM - continue nightly CPAP -EEG was negative for seizure or epileptiform discharges. -UA not concerning for UTI.  -neurology signed off 12/23   TIA Stroke Like Symptoms Presented with episode of slurred speech and right facial droop Seen by neurology, suspected left leg weakness is due to small stroke few months ago MRI brain without any acute abnormality, chronic ischemic changes, remote lacunar infarcts CTA head and neck stable from 11/25, bulky calcified plaque about L carotid bulb/proximal L ICA with severe 80% stenosis by NASCET criteria - occluded L vertebral artery continue with eliquis  per neurology PT/OT/SLP Needs outpatient neurology follow up   Acute kidney injury - resolved   Bradycardia - Heart rate has been in low 50s - hold metop    Left lower extremity weakness  neurology suspects the L leg weakness is related to Dalyce Renne small stroke [name] few months ago  MRI of C/T/L spine without findings that look like they're related, neurology notes MRI spine does not convincingly explain L leg  weakness per neuro will need outpatient EMG/NCS PT/OT   Degenerative Disc Disease Mild to moderate Diffuse Spinal Stenosis at C2-3 through C6-7 Disc Bulge with Facet Hypertrophy at L4-5 with L worse than R Lateral Recess Stenosis with moderate L worse than R L4 Foraminal Narrowing  Disc Protrusion at L3-4  Bilateral Facet Hypertrophy at L3-4 through L5-S1 -consider ortho/spine consult referral outpatient -pt/ot    Mild hyperkalemia Resolved   HFpEF Euvolemic on exam metoprolol /cozaar  Lasix  currently on hold   DMII SSI lantus  18 units CBG well-controlled   Gout allopurinol     P Afib continue on eliquis   HTN Losartan  Amlodipine  added here Metoprolol  on hold due to bradycardia above  CKDIIIa -at baseline    BPH  -continue finasteride     HLD -continue statin    OSA cpap at bedtime  Class II Obesity Body mass index is 39.32 kg/m.  Stable at this time.  Awaiting appeal.     DVT prophylaxis: eliquis  Code Status: full Family Communication: none Disposition:   Status is: Inpatient Remains inpatient appropriate because: need for continued inpatient care   Consultants:  neurology  Procedures:  Echo IMPRESSIONS     1. Left ventricular ejection fraction, by estimation, is 55 to 60%. The  left ventricle has normal function. The left ventricle has no regional  wall motion abnormalities. There is mild concentric left ventricular  hypertrophy. Left ventricular diastolic  parameters are consistent with Grade I diastolic dysfunction (impaired  relaxation).   2. Right ventricular systolic function is normal. The right ventricular  size is normal. Tricuspid regurgitation signal is inadequate for assessing  PA pressure.   3. The mitral valve is normal in structure. No evidence of mitral valve  regurgitation. No evidence of mitral stenosis.   4. The aortic valve is normal in structure. Aortic valve regurgitation is  not visualized. No aortic stenosis is  present.   5. The inferior vena cava is normal in size with greater than 50%  respiratory variability, suggesting right atrial pressure of 3 mmHg.   Antimicrobials:  Anti-infectives (From admission, onward)    None       Subjective: No new complaints  Objective: Vitals:   11/22/24 2347 11/23/24 0356 11/23/24 0752 11/23/24 1132  BP: (!) 126/45 (!) 129/54 (!) 134/48 (!) 132/54  Pulse: (!) 54 (!) 53 (!) 51 (!) 58  Resp: 16  16 17   Temp: 98.4 F (36.9 C) 98.4 F (36.9 C) 98.6 F (37 C) 98.6 F (37 C)  TempSrc: Oral Oral    SpO2: 100% 100% 98% 98%  Weight:      Height:        Intake/Output Summary (Last 24 hours) at 11/23/2024 1606 Last data filed at 11/23/2024 0900 Gross per 24 hour  Intake 200 ml  Output 850 ml  Net -650 ml    Filed Weights   11/15/24 1119  Weight: 103.9 kg    Examination:  General: No acute distress. Cardiovascular: RRR Lungs: unlabored Neurological: Alert. Moves all extremities 4 with equal strength. Cranial nerves II through XII grossly intact. Extremities: No clubbing or cyanosis. No edema.   Data Reviewed: I have personally reviewed following labs and imaging studies  CBC: Recent Labs  Lab 11/21/24 0928 11/22/24 0148  WBC 5.4 5.9  NEUTROABS  --  3.2  HGB 12.9* 12.8*  HCT 40.0 40.0  MCV 89.9 90.5  PLT 292 267    Basic Metabolic Panel: Recent Labs  Lab 11/18/24 0531 11/19/24 0151 11/20/24 0145 11/21/24 0928 11/22/24 0148  NA 138 139 142 144 143  K 4.2 4.0 4.1 4.7 4.7  CL 103 106 109 111 110  CO2 22 24 26 24 25   GLUCOSE 154* 138* 147* 73 115*  BUN 62* 66* 50* 31* 34*  CREATININE 1.75* 1.48* 1.16 0.87 1.25*  CALCIUM  9.5 9.3 9.3 9.7 9.5  MG  --   --   --   --  2.3  PHOS  --   --   --   --  3.2    GFR: Estimated Creatinine Clearance: 56.5 mL/min (Symeon Puleo) (by C-G formula based on SCr of 1.25 mg/dL (H)).  Liver Function Tests: Recent Labs  Lab 11/22/24 0148  AST 24  ALT 21  ALKPHOS 101  BILITOT 0.3  PROT 7.3   ALBUMIN 3.4*    CBG: Recent Labs  Lab 11/22/24 1233 11/22/24 1614 11/22/24 2148 11/23/24 0618 11/23/24 1119  GLUCAP 137* 163* 162* 134* 152*     No results found for this or any previous visit (from the past 240 hours).       Radiology Studies: No results found.      Scheduled Meds:  allopurinol   300 mg Oral q AM   amLODipine   5 mg Oral Daily   apixaban   5 mg Oral BID   aspirin   81 mg Oral Daily   atorvastatin   80 mg Oral QHS   finasteride   5 mg Oral QPM   insulin  aspart  0-5 Units Subcutaneous QHS   insulin  aspart  0-9 Units Subcutaneous TID WC  insulin  glargine  18 Units Subcutaneous QHS   losartan   100 mg Oral Daily   pantoprazole   40 mg Oral Daily   thiamine   100 mg Oral Daily   Continuous Infusions:   LOS: 7 days    Time spent: over 30 min     Meliton Monte, MD [facility]   To contact the attending provider between 7A-7P or the covering provider during after hours 7P-7A, please log into the web site [email or web address] and access using universal [place] password for that web site. If you do not have the password, please call the hospital operator.  11/23/2024, 4:06 PM    "

## 2024-11-23 NOTE — Plan of Care (Signed)
 Pt. Is awake, alert, oriented, and progressing.

## 2024-11-23 NOTE — Progress Notes (Signed)
 Physical Therapy Treatment Patient Details Name: Raymond Gordon MRN: [id number] DOB: May 12, 1951 Today's Date: 11/23/2024   History of Present Illness Pt is a 73 y/o M who presented to [facility] ED with episode of slurred speech and RFD. MRI brain negative for acute findings. PMHx: DMII, HLD, gout, afib, HTN, CHF, OSA, CKDIII, anemia, dementia, BPH with chronic indwelling foley catheter.    PT Comments  Patient continues to slowly progress towards goals. Able to walk twice with RW and min assist for balance x 15 ft each time. Does better with shoes on (donned prior to sit to stand). LE exercises prior to OOB for improved ROM and strengthening. Patient will benefit from continued inpatient follow up therapy, <3 hours/day     If plan is discharge home, recommend the following: A lot of help with bathing/dressing/bathroom;Assistance with cooking/housework;Assist for transportation;Help with stairs or ramp for entrance;A little help with walking and/or transfers;Supervision due to cognitive status;Direct supervision/assist for medications management   Can travel by private vehicle     Yes  Equipment Recommendations  None recommended by PT    Recommendations for Other Services       Precautions / Restrictions Precautions Precautions: Fall Recall of Precautions/Restrictions: Intact Precaution/Restrictions Comments: fearful of falling Restrictions Weight Bearing Restrictions Per Provider Order: No     Mobility  Bed Mobility Overal bed mobility: Needs Assistance Bed Mobility: Supine to Sit     Supine to sit: Contact guard, HOB elevated, Used rails     General bed mobility comments: incr time and effort with no physical assist    Transfers Overall transfer level: Needs assistance Equipment used: Rolling walker (2 wheels) Transfers: Sit to/from Stand Sit to Stand: Min assist           General transfer comment: assist with anterior translation over feet; from EOB x 2 reps     Ambulation/Gait Ambulation/Gait assistance: Min assist, Contact guard assist Gait Distance (Feet): 15 Feet (seated rest; 15) Assistive device: Rolling walker (2 wheels) Gait Pattern/deviations: Step-to pattern, Decreased step length - right, Decreased step length - left, Shuffle, Trunk flexed, Narrow base of support, Decreased weight shift to left Gait velocity: decreased     General Gait Details: Pt demonstrates very short steps with poor foot clearance despite cues. 1 LOB requiring assist to correct. Cues for upright posture and RW proximity,   Stairs             Wheelchair Mobility     Tilt Bed    Modified Rankin (Stroke Patients Only)       Balance Overall balance assessment: Needs assistance Sitting-balance support: Feet supported, Bilateral upper extremity supported Sitting balance-Leahy Scale: Fair Sitting balance - Comments: supervision sitting EOB   Standing balance support: Single extremity supported, Bilateral upper extremity supported, During functional activity Standing balance-Leahy Scale: Poor Standing balance comment: reliant on at least one extremity support for static standing                            Communication Communication Communication: No apparent difficulties  Cognition Arousal: Alert Behavior During Therapy: WFL for tasks assessed/performed   PT - Cognitive impairments: History of cognitive impairments                       PT - Cognition Comments: delayed processing Following commands: Impaired Following commands impaired: Follows multi-step commands with increased time    Cueing Cueing Techniques: Verbal cues, Gestural  cues  Exercises General Exercises - Lower Extremity Ankle Circles/Pumps: Both, 10 reps Heel Slides: AROM, Both, 10 reps    General Comments        Pertinent Vitals/Pain Pain Assessment Pain Assessment: No/denies pain    Home Living                          Prior  Function            PT Goals (current goals can now be found in the care plan section) Acute Rehab PT Goals Patient Stated Goal: to return to independent ambulation, improve LLE strength PT Goal Formulation: With patient Time For Goal Achievement: 11/27/24 Potential to Achieve Goals: Good Progress towards PT goals: Progressing toward goals    Frequency    Min 2X/week      PT Plan      Co-evaluation              AM-PAC PT 6 Clicks Mobility   Outcome Measure  Help needed turning from your back to your side while in a flat bed without using bedrails?: A Little Help needed moving from lying on your back to sitting on the side of a flat bed without using bedrails?: A Little Help needed moving to and from a bed to a chair (including a wheelchair)?: A Little Help needed standing up from a chair using your arms (e.g., wheelchair or bedside chair)?: A Little Help needed to walk in hospital room?: A Lot (<20 ft) Help needed climbing 3-5 steps with a railing? : A Lot 6 Click Score: 16    End of Session Equipment Utilized During Treatment: Gait belt Activity Tolerance: Patient tolerated treatment well Patient left: with call bell/phone within reach;in chair;with chair alarm set Nurse Communication: Mobility status PT Visit Diagnosis: Other abnormalities of gait and mobility (R26.89);Muscle weakness (generalized) (M62.81);Unsteadiness on feet (R26.81)     Time: 9041-8981 PT Time Calculation (min) (ACUTE ONLY): 20 min  Charges:    $Gait Training: 8-22 mins PT General Charges $$ ACUTE PT VISIT: 1 Visit                      [name], PT Acute Rehabilitation Services  Office [phone]    [name] 11/23/2024, 10:31 AM

## 2024-11-23 NOTE — Plan of Care (Signed)
  Problem: Education: Goal: Knowledge of disease or condition will improve Outcome: Progressing Goal: Knowledge of secondary prevention will improve (MUST DOCUMENT ALL) Outcome: Progressing Goal: Knowledge of patient specific risk factors will improve (DELETE if not current risk factor) Outcome: Progressing   Problem: Ischemic Stroke/TIA Tissue Perfusion: Goal: Complications of ischemic stroke/TIA will be minimized Outcome: Progressing   Problem: Coping: Goal: Will verbalize positive feelings about self Outcome: Progressing

## 2024-11-23 NOTE — Plan of Care (Signed)

## 2024-11-24 DIAGNOSIS — G459 Transient cerebral ischemic attack, unspecified: Secondary | ICD-10-CM | POA: Diagnosis not present

## 2024-11-24 LAB — GLUCOSE, CAPILLARY
Glucose-Capillary: 129 mg/dL — ABNORMAL HIGH (ref 70–99)
Glucose-Capillary: 95 mg/dL (ref 70–99)
Glucose-Capillary: 145 mg/dL — ABNORMAL HIGH (ref 70–99)
Glucose-Capillary: 212 mg/dL — ABNORMAL HIGH (ref 70–99)
Glucose-Capillary: 179 mg/dL — ABNORMAL HIGH (ref 70–99)
Glucose-Capillary: 172 mg/dL — ABNORMAL HIGH (ref 70–99)

## 2024-11-24 NOTE — Progress Notes (Signed)
 " PROGRESS NOTE    Raymond Gordon  FMW:[id number] DOB: [birth year] DOA: 11/12/2024 PCP: [name], MD  Chief Complaint  Patient presents with   Stroke Symptoms    Brief Narrative:    73 y.o. male with medical history significant of DMII, Gout, afib, HTN, OSA, Gout, CKD IIIa, Anemia of chronic disease, Dementia, BPH chronic indwelling foley, HLD, Hfpef.    He was found by family, unable to get up from bathroom.  L leg weakness and L arm weakness noted.    Assessment & Plan:   Principal Problem:   TIA (transient ischemic attack)  Acute Metabolic Encephalopathy Delirium  - Patient gets confused usually around 5 PM, although no agitation - Although workup has been negative - thiamine  level wnl, b12 level wnl, TSH wnl - follow ammonia wnl, venous blood gas (without hypercarbia) - MRI brain on 11/12/2024 was unremarkable - hold further trazodone  due to lethargy noted 12/28 AM - continue nightly CPAP -EEG was negative for seizure or epileptiform discharges. -UA not concerning for UTI.  -neurology signed off 12/23   TIA Stroke Like Symptoms Presented with episode of slurred speech and right facial droop Seen by neurology, suspected left leg weakness is due to small stroke few months ago MRI brain without any acute abnormality, chronic ischemic changes, remote lacunar infarcts CTA head and neck stable from 11/25, bulky calcified plaque about L carotid bulb/proximal L ICA with severe 80% stenosis by NASCET criteria - occluded L vertebral artery continue with eliquis  per neurology PT/OT/SLP Needs outpatient neurology follow up   Acute kidney injury - resolved   Bradycardia - Heart rate has been in low 50s - hold metop    Left lower extremity weakness  neurology suspects the L leg weakness is related to Zhi Geier small stroke Carlis Burnsworth few months ago  MRI of C/T/L spine without findings that look like they're related, neurology notes MRI spine does not convincingly explain L leg  weakness per neuro will need outpatient EMG/NCS PT/OT   Degenerative Disc Disease Mild to moderate Diffuse Spinal Stenosis at C2-3 through C6-7 Disc Bulge with Facet Hypertrophy at L4-5 with L worse than R Lateral Recess Stenosis with moderate L worse than R L4 Foraminal Narrowing  Disc Protrusion at L3-4  Bilateral Facet Hypertrophy at L3-4 through L5-S1 -consider ortho/spine consult referral outpatient -pt/ot    Mild hyperkalemia Resolved   HFpEF Euvolemic on exam metoprolol /cozaar  Lasix  currently on hold   DMII SSI lantus  18 units CBG well-controlled   Gout allopurinol     P Afib continue on eliquis   HTN Losartan  Amlodipine  added here Metoprolol  on hold due to bradycardia above  CKDIIIa -at baseline    BPH  -continue finasteride     HLD -continue statin    OSA cpap at bedtime  Class II Obesity Body mass index is 39.32 kg/m.  Stable, awaiting appeal for SNF.  Therapy continuing to recommend SNF.      DVT prophylaxis: eliquis  Code Status: full Family Communication: none Disposition:   Status is: Inpatient Remains inpatient appropriate because: need for continued inpatient care   Consultants:  neurology  Procedures:  Echo IMPRESSIONS     1. Left ventricular ejection fraction, by estimation, is 55 to 60%. The  left ventricle has normal function. The left ventricle has no regional  wall motion abnormalities. There is mild concentric left ventricular  hypertrophy. Left ventricular diastolic  parameters are consistent with Grade I diastolic dysfunction (impaired  relaxation).   2. Right ventricular systolic function is  normal. The right ventricular  size is normal. Tricuspid regurgitation signal is inadequate for assessing  PA pressure.   3. The mitral valve is normal in structure. No evidence of mitral valve  regurgitation. No evidence of mitral stenosis.   4. The aortic valve is normal in structure. Aortic valve regurgitation is  not  visualized. No aortic stenosis is present.   5. The inferior vena cava is normal in size with greater than 50%  respiratory variability, suggesting right atrial pressure of 3 mmHg.   Antimicrobials:  Anti-infectives (From admission, onward)    None       Subjective: No complaints  Objective: Vitals:   11/23/24 1938 11/23/24 2340 11/24/24 0420 11/24/24 0840  BP: (!) 128/55 (!) 136/51 (!) 161/70 (!) 155/67  Pulse: (!) 58 (!) 51 (!) 56 (!) 51  Resp: 17 17 17 18   Temp: 98.4 F (36.9 C) 98.4 F (36.9 C) 98.6 F (37 C) 98.5 F (36.9 C)  TempSrc: Oral Oral Axillary   SpO2: 100% 99% 100% 100%  Weight:      Height:        Intake/Output Summary (Last 24 hours) at 11/24/2024 1015 Last data filed at 11/24/2024 0900 Gross per 24 hour  Intake --  Output 1100 ml  Net -1100 ml    Filed Weights   11/15/24 1119  Weight: 103.9 kg    Examination:  General: No acute distress. Cardiovascular: RRR Lungs: unlabored Neurological: Alert . Moves all extremities 4 with equal strength. Cranial nerves II through XII grossly intact. Extremities: No clubbing or cyanosis. No edema.   Data Reviewed: I have personally reviewed following labs and imaging studies  CBC: Recent Labs  Lab 11/21/24 0928 11/22/24 0148  WBC 5.4 5.9  NEUTROABS  --  3.2  HGB 12.9* 12.8*  HCT 40.0 40.0  MCV 89.9 90.5  PLT 292 267    Basic Metabolic Panel: Recent Labs  Lab 11/18/24 0531 11/19/24 0151 11/20/24 0145 11/21/24 0928 11/22/24 0148  NA 138 139 142 144 143  K 4.2 4.0 4.1 4.7 4.7  CL 103 106 109 111 110  CO2 22 24 26 24 25   GLUCOSE 154* 138* 147* 73 115*  BUN 62* 66* 50* 31* 34*  CREATININE 1.75* 1.48* 1.16 0.87 1.25*  CALCIUM  9.5 9.3 9.3 9.7 9.5  MG  --   --   --   --  2.3  PHOS  --   --   --   --  3.2    GFR: Estimated Creatinine Clearance: 56.5 mL/min (Oona Trammel) (by C-G formula based on SCr of 1.25 mg/dL (H)).  Liver Function Tests: Recent Labs  Lab 11/22/24 0148  AST 24  ALT 21   ALKPHOS 101  BILITOT 0.3  PROT 7.3  ALBUMIN 3.4*    CBG: Recent Labs  Lab 11/23/24 1119 11/23/24 1649 11/23/24 2104 11/24/24 0601 11/24/24 0833  GLUCAP 152* 130* 148* 129* 95     No results found for this or any previous visit (from the past 240 hours).       Radiology Studies: No results found.      Scheduled Meds:  allopurinol   300 mg Oral q AM   amLODipine   5 mg Oral Daily   apixaban   5 mg Oral BID   aspirin   81 mg Oral Daily   atorvastatin   80 mg Oral QHS   finasteride   5 mg Oral QPM   insulin  aspart  0-5 Units Subcutaneous QHS   insulin  aspart  0-9  Units Subcutaneous TID WC   insulin  glargine  18 Units Subcutaneous QHS   losartan   100 mg Oral Daily   pantoprazole   40 mg Oral Daily   thiamine   100 mg Oral Daily   Continuous Infusions:   LOS: 8 days    Time spent: over 30 min     Meliton Monte, MD [facility]   To contact the attending provider between 7A-7P or the covering provider during after hours 7P-7A, please log into the web site [email or web address] and access using universal [place] password for that web site. If you do not have the password, please call the hospital operator.  11/24/2024, 10:15 AM    "

## 2024-11-25 DIAGNOSIS — G459 Transient cerebral ischemic attack, unspecified: Secondary | ICD-10-CM | POA: Diagnosis not present

## 2024-11-25 LAB — GLUCOSE, CAPILLARY
Glucose-Capillary: 139 mg/dL — ABNORMAL HIGH (ref 70–99)
Glucose-Capillary: 179 mg/dL — ABNORMAL HIGH (ref 70–99)
Glucose-Capillary: 192 mg/dL — ABNORMAL HIGH (ref 70–99)
Glucose-Capillary: 196 mg/dL — ABNORMAL HIGH (ref 70–99)

## 2024-11-25 MED ORDER — HYDRALAZINE HCL 20 MG/ML IJ SOLN
10.0000 mg | Freq: Four times a day (QID) | INTRAMUSCULAR | Status: DC | PRN
  Administered 2024-11-25 (×2): 10 mg via INTRAVENOUS
  Filled 2024-11-25 (×2): qty 1

## 2024-11-25 MED ORDER — AMLODIPINE BESYLATE 5 MG PO TABS
10.0000 mg | ORAL_TABLET | Freq: Every day | ORAL | Status: DC
  Administered 2024-11-26 – 2024-11-30 (×5): 10 mg via ORAL
  Filled 2024-11-25 (×5): qty 2

## 2024-11-25 MED ORDER — AMLODIPINE BESYLATE 5 MG PO TABS
5.0000 mg | ORAL_TABLET | Freq: Once | ORAL | Status: DC
  Filled 2024-11-25: qty 1

## 2024-11-25 NOTE — Plan of Care (Signed)
  Problem: Education: Goal: Knowledge of disease or condition will improve Outcome: Progressing Goal: Knowledge of secondary prevention will improve (MUST DOCUMENT ALL) Outcome: Progressing Goal: Knowledge of patient specific risk factors will improve (DELETE if not current risk factor) Outcome: Progressing   Problem: Ischemic Stroke/TIA Tissue Perfusion: Goal: Complications of ischemic stroke/TIA will be minimized Outcome: Progressing   Problem: Coping: Goal: Will verbalize positive feelings about self Outcome: Progressing Goal: Will identify appropriate support needs Outcome: Progressing   Problem: Health Behavior/Discharge Planning: Goal: Ability to manage health-related needs will improve Outcome: Progressing Goal: Goals will be collaboratively established with patient/family Outcome: Progressing   Problem: Self-Care: Goal: Ability to participate in self-care as condition permits will improve Outcome: Progressing

## 2024-11-25 NOTE — Progress Notes (Addendum)
 " PROGRESS NOTE    Raymond Gordon  FMW:[id number] DOB: 1951/03/29 DOA: 11/12/2024 PCP: Raymond Gordon  Chief Complaint  Patient presents with   Stroke Symptoms    Brief Narrative:    74 y.o. male with medical history significant of DMII, Gout, afib, HTN, OSA, Gout, CKD IIIa, Anemia of chronic disease, Dementia, BPH chronic indwelling foley, HLD, Hfpef.    He was found by family, unable to get up from bathroom.  L leg weakness and L arm weakness noted.    Assessment & Plan:   Principal Problem:   TIA (transient ischemic attack)  Acute Metabolic Encephalopathy Delirium  - patient was getting confused in the afternoon - this seems to have improved. - Although workup has been negative - thiamine  level wnl, b12 level wnl, TSH wnl - follow ammonia wnl, venous blood gas (without hypercarbia) - MRI brain on 11/12/2024 was unremarkable - hold further trazodone  due to lethargy noted 12/28 AM - continue nightly CPAP -EEG was negative for seizure or epileptiform discharges. -UA not concerning for UTI.  -neurology signed off 12/23 - Raymond Gordon&Ox3 today (corrected himself on location).     TIA Stroke Like Symptoms Presented with episode of slurred speech and right facial droop Seen by neurology, suspected left leg weakness is due to small stroke few months ago MRI brain without any acute abnormality, chronic ischemic changes, remote lacunar infarcts CTA head and neck stable from 11/25, bulky calcified plaque about L carotid bulb/proximal L ICA with severe 80% stenosis by NASCET criteria - occluded L vertebral artery continue with eliquis  per neurology PT/OT/SLP Needs outpatient neurology follow up   Acute kidney injury - resolved   Bradycardia - Heart rate has been in low 50s - hold metop    Left lower extremity weakness  neurology suspects the L leg weakness is related to Raymond Gordon small stroke Raymond Gordon few months ago  MRI of C/T/L spine without findings that look like they're related,  neurology notes MRI spine does not convincingly explain L leg weakness per neuro will need outpatient EMG/NCS PT/OT   Degenerative Disc Disease Mild to moderate Diffuse Spinal Stenosis at C2-3 through C6-7 Disc Bulge with Facet Hypertrophy at L4-5 with L worse than R Lateral Recess Stenosis with moderate L worse than R L4 Foraminal Narrowing  Disc Protrusion at L3-4  Bilateral Facet Hypertrophy at L3-4 through L5-S1 -consider ortho/spine consult referral outpatient -pt/ot    Mild hyperkalemia Resolved   HFpEF Euvolemic on exam metoprolol /cozaar  Lasix  currently on hold   DMII SSI lantus  18 units CBG well-controlled   Gout allopurinol     P Afib continue on eliquis   HTN Losartan  Amlodipine  added here - increase dose today, monitor Metoprolol  on hold due to bradycardia above  CKDIIIa -at baseline    BPH  -continue finasteride     HLD -continue statin    OSA cpap at bedtime  Class II Obesity Body mass index is 39.32 kg/m.  Appeal has been denied.  Sister says no one able to stay with him, working on personal assistant.  Asking therapy to reevaluate so we can see where he is now.  Discharge pending safe discharge plan.    DVT prophylaxis: eliquis  Code Status: full Family Communication: Raymond Gordon 11/25/24 Disposition:   Status is: Inpatient Remains inpatient appropriate because: need for continued inpatient care   Consultants:  neurology  Procedures:  Echo IMPRESSIONS     1. Left ventricular ejection fraction, by estimation, is 55 to 60%. The  left ventricle has  normal function. The left ventricle has no regional  wall motion abnormalities. There is mild concentric left ventricular  hypertrophy. Left ventricular diastolic  parameters are consistent with Grade I diastolic dysfunction (impaired  relaxation).   2. Right ventricular systolic function is normal. The right ventricular  size is normal. Tricuspid regurgitation signal is inadequate  for assessing  PA pressure.   3. The mitral valve is normal in structure. No evidence of mitral valve  regurgitation. No evidence of mitral stenosis.   4. The aortic valve is normal in structure. Aortic valve regurgitation is  not visualized. No aortic stenosis is present.   5. The inferior vena cava is normal in size with greater than 50%  respiratory variability, suggesting right atrial pressure of 3 mmHg.   Antimicrobials:  Anti-infectives (From admission, onward)    None       Subjective: No new complaints  Objective: Vitals:   11/25/24 0052 11/25/24 0133 11/25/24 0357 11/25/24 0759  BP: (!) 177/53 (!) 139/47 (!) 144/45 (!) 139/43  Pulse:   60 68  Resp:      Temp:   98.4 F (36.9 C) 98.6 F (37 C)  TempSrc:   Oral Oral  SpO2:   100% 97%  Weight:      Height:        Intake/Output Summary (Last 24 hours) at 11/25/2024 1037 Last data filed at 11/25/2024 0838 Gross per 24 hour  Intake 240 ml  Output 1100 ml  Net -860 ml    Filed Weights   11/15/24 1119  Weight: 103.9 kg    Examination:  General: No acute distress. Cardiovascular: RRR Lungs: unlabored Neurological: Alert and oriented 3 (slow at identifying location, said nursing home then gradually self corrected). Moves all extremities 4. Cranial nerves II through XII grossly intact. Extremities: No clubbing or cyanosis. No edema.   Data Reviewed: I have personally reviewed following labs and imaging studies  CBC: Recent Labs  Lab 11/21/24 0928 11/22/24 0148  WBC 5.4 5.9  NEUTROABS  --  3.2  HGB 12.9* 12.8*  HCT 40.0 40.0  MCV 89.9 90.5  PLT 292 267    Basic Metabolic Panel: Recent Labs  Lab 11/19/24 0151 11/20/24 0145 11/21/24 0928 11/22/24 0148  NA 139 142 144 143  K 4.0 4.1 4.7 4.7  CL 106 109 111 110  CO2 24 26 24 25   GLUCOSE 138* 147* 73 115*  BUN 66* 50* 31* 34*  CREATININE 1.48* 1.16 0.87 1.25*  CALCIUM  9.3 9.3 9.7 9.5  MG  --   --   --  2.3  PHOS  --   --   --  3.2     GFR: Estimated Creatinine Clearance: 56.5 mL/min (Raymond Gordon) (by C-G formula based on SCr of 1.25 mg/dL (H)).  Liver Function Tests: Recent Labs  Lab 11/22/24 0148  AST 24  ALT 21  ALKPHOS 101  BILITOT 0.3  PROT 7.3  ALBUMIN 3.4*    CBG: Recent Labs  Lab 11/24/24 1146 11/24/24 1453 11/24/24 1654 11/24/24 2141 11/25/24 0623  GLUCAP 145* 212* 179* 172* 139*     No results found for this or any previous visit (from the past 240 hours).       Radiology Studies: No results found.      Scheduled Meds:  allopurinol   300 mg Oral q AM   amLODipine   5 mg Oral Daily   apixaban   5 mg Oral BID   aspirin   81 mg Oral Daily  atorvastatin   80 mg Oral QHS   finasteride   5 mg Oral QPM   insulin  aspart  0-5 Units Subcutaneous QHS   insulin  aspart  0-9 Units Subcutaneous TID WC   insulin  glargine  18 Units Subcutaneous QHS   losartan   100 mg Oral Daily   pantoprazole   40 mg Oral Daily   thiamine   100 mg Oral Daily   Continuous Infusions:   LOS: 9 days    Time spent: over 30 min     Meliton Monte, Gordon [facility]   To contact the attending provider between 7A-7P or the covering provider during after hours 7P-7A, please log into the web site [email or web address] and access using universal [place] password for that web site. If you do not have the password, please call the hospital operator.  11/25/2024, 10:37 AM    "

## 2024-11-25 NOTE — Plan of Care (Signed)
" °  Problem: Education: Goal: Knowledge of disease or condition will improve Outcome: Progressing   Problem: Ischemic Stroke/TIA Tissue Perfusion: Goal: Complications of ischemic stroke/TIA will be minimized Outcome: Progressing   Problem: Coping: Goal: Will verbalize positive feelings about self Outcome: Progressing Goal: Will identify appropriate support needs Outcome: Progressing   Problem: Health Behavior/Discharge Planning: Goal: Ability to manage health-related needs will improve Outcome: Progressing Goal: Goals will be collaboratively established with patient/family Outcome: Progressing   Problem: Self-Care: Goal: Ability to participate in self-care as condition permits will improve Outcome: Progressing Goal: Verbalization of feelings and concerns over difficulty with self-care will improve Outcome: Progressing   Problem: Coping: Goal: Ability to adjust to condition or change in health will improve Outcome: Progressing   Problem: Fluid Volume: Goal: Ability to maintain a balanced intake and output will improve Outcome: Progressing   Problem: Health Behavior/Discharge Planning: Goal: Ability to manage health-related needs will improve Outcome: Progressing   Problem: Metabolic: Goal: Ability to maintain appropriate glucose levels will improve Outcome: Progressing   Problem: Skin Integrity: Goal: Risk for impaired skin integrity will decrease Outcome: Progressing   Problem: Tissue Perfusion: Goal: Adequacy of tissue perfusion will improve Outcome: Progressing   "

## 2024-11-25 NOTE — Plan of Care (Signed)
" °  Problem: Education: Goal: Knowledge of disease or condition will improve Outcome: Progressing   Problem: Ischemic Stroke/TIA Tissue Perfusion: Goal: Complications of ischemic stroke/TIA will be minimized Outcome: Progressing   Problem: Coping: Goal: Will verbalize positive feelings about self Outcome: Progressing   Problem: Self-Care: Goal: Ability to participate in self-care as condition permits will improve Outcome: Progressing   Problem: Skin Integrity: Goal: Risk for impaired skin integrity will decrease Outcome: Progressing   Problem: Elimination: Goal: Will not experience complications related to bowel motility Outcome: Progressing   Problem: Safety: Goal: Ability to remain free from injury will improve Outcome: Progressing   "

## 2024-11-25 NOTE — TOC Progression Note (Signed)
 Transition of Care [facility]) - Progression Note    Patient Details  Name: Raymond Gordon MRN: [id number] Date of Birth: 04-02-1951  Transition of [facility]) CM/SW Contact  [name], [name] Phone Number: 11/25/2024, 11:41 AM  Clinical Narrative:   CSW contacted by [facility] [employer] that appeal request was upheld, SNF request has been denied. It is being sent to second level review. Patient not at baseline, unable to safely discharge home alone, and no family is able to provide care; sister is working on [employer] application, CSW to contact financial counseling tomorrow to assist, no one in office today due to holiday. CSW contacted [facility] that patient was denied, they do not have any LTC beds available. CSW to follow.    Expected Discharge Plan: Skilled Nursing Facility Barriers to Discharge: Insurance Authorization               Expected Discharge Plan and Services                                               Social Drivers of Health (SDOH) Interventions SDOH Screenings   Food Insecurity: No Food Insecurity (11/13/2024)  Housing: Low Risk (11/13/2024)  Transportation Needs: Unmet Transportation Needs (11/13/2024)  Utilities: Not At Risk (11/13/2024)  Social Connections: Moderately Isolated (11/13/2024)  Tobacco Use: Medium Risk (11/13/2024)    Readmission Risk Interventions     No data to display

## 2024-11-25 NOTE — Plan of Care (Signed)
" °  Problem: Education: Goal: Knowledge of disease or condition will improve 11/25/2024 0554 by Cesario Martina MANIFOLD, RN Outcome: Progressing 11/25/2024 0059 by Cesario Martina MANIFOLD, RN Outcome: Progressing Goal: Knowledge of secondary prevention will improve (MUST DOCUMENT ALL) 11/25/2024 0554 by Cesario Martina MANIFOLD, RN Outcome: Progressing 11/25/2024 0059 by Cesario Martina MANIFOLD, RN Outcome: Progressing Goal: Knowledge of patient specific risk factors will improve (DELETE if not current risk factor) 11/25/2024 0554 by Cesario Martina MANIFOLD, RN Outcome: Progressing 11/25/2024 0059 by Cesario Martina MANIFOLD, RN Outcome: Progressing   Problem: Ischemic Stroke/TIA Tissue Perfusion: Goal: Complications of ischemic stroke/TIA will be minimized 11/25/2024 0554 by Cesario Martina MANIFOLD, RN Outcome: Progressing 11/25/2024 0059 by Cesario Martina MANIFOLD, RN Outcome: Progressing   Problem: Coping: Goal: Will verbalize positive feelings about self 11/25/2024 0554 by Cesario Martina MANIFOLD, RN Outcome: Progressing 11/25/2024 0059 by Cesario Martina MANIFOLD, RN Outcome: Progressing Goal: Will identify appropriate support needs 11/25/2024 0554 by Cesario Martina MANIFOLD, RN Outcome: Progressing 11/25/2024 0059 by Cesario Martina MANIFOLD, RN Outcome: Progressing   Problem: Health Behavior/Discharge Planning: Goal: Ability to manage health-related needs will improve 11/25/2024 0554 by Cesario Martina MANIFOLD, RN Outcome: Progressing 11/25/2024 0059 by Cesario Martina MANIFOLD, RN Outcome: Progressing Goal: Goals will be collaboratively established with patient/family Outcome: Progressing   Problem: Self-Care: Goal: Ability to participate in self-care as condition permits will improve Outcome: Progressing   "

## 2024-11-26 DIAGNOSIS — G459 Transient cerebral ischemic attack, unspecified: Secondary | ICD-10-CM | POA: Diagnosis not present

## 2024-11-26 LAB — CBC
HCT: 38.7 % — ABNORMAL LOW (ref 39.0–52.0)
Hemoglobin: 12.5 g/dL — ABNORMAL LOW (ref 13.0–17.0)
MCH: 28.8 pg (ref 26.0–34.0)
MCHC: 32.3 g/dL (ref 30.0–36.0)
MCV: 89.2 fL (ref 80.0–100.0)
Platelets: 300 K/uL (ref 150–400)
RBC: 4.34 MIL/uL (ref 4.22–5.81)
RDW: 15.9 % — ABNORMAL HIGH (ref 11.5–15.5)
WBC: 4.9 K/uL (ref 4.0–10.5)
nRBC: 0 % (ref 0.0–0.2)

## 2024-11-26 LAB — GLUCOSE, CAPILLARY
Glucose-Capillary: 105 mg/dL — ABNORMAL HIGH (ref 70–99)
Glucose-Capillary: 110 mg/dL — ABNORMAL HIGH (ref 70–99)
Glucose-Capillary: 189 mg/dL — ABNORMAL HIGH (ref 70–99)
Glucose-Capillary: 146 mg/dL — ABNORMAL HIGH (ref 70–99)

## 2024-11-26 LAB — BASIC METABOLIC PANEL WITH GFR
Anion gap: 10 (ref 5–15)
BUN: 38 mg/dL — ABNORMAL HIGH (ref 8–23)
CO2: 21 mmol/L — ABNORMAL LOW (ref 22–32)
Calcium: 9.5 mg/dL (ref 8.9–10.3)
Chloride: 108 mmol/L (ref 98–111)
Creatinine, Ser: 1.14 mg/dL (ref 0.61–1.24)
GFR, Estimated: >60 mL/min
Glucose, Bld: 134 mg/dL — ABNORMAL HIGH (ref 70–99)
Potassium: 4.6 mmol/L (ref 3.5–5.1)
Sodium: 138 mmol/L (ref 135–145)

## 2024-11-26 MED ORDER — TRAZODONE HCL 50 MG PO TABS
25.0000 mg | ORAL_TABLET | Freq: Every day | ORAL | Status: DC
  Administered 2024-11-26 – 2024-11-29 (×4): 25 mg via ORAL
  Filled 2024-11-26 (×4): qty 1

## 2024-11-26 NOTE — Progress Notes (Signed)
 Physical Therapy Treatment Patient Details Name: Raymond Gordon MRN: [id number] DOB: Jul 02, 1951 Today's Date: 11/26/2024   History of Present Illness Pt is a 74 y.o. M who presented to [facility] ED 11/12/24 with episode of slurred speech and RFD. MRI brain negative for acute findings. PMHx: DMII, HLD, gout, afib, HTN, CHF, OSA, CKDIII, anemia, dementia, BPH with chronic indwelling foley catheter.    PT Comments  Pt is making steady progress towards his acute PT goals demonstrated by advanced functional mobility with decreased physical assistance. He increased gait distance ambulating ~59ft twice using RW with CGA and a close chair follow. Pt required one seated rest break between walking bouts. He complete 3 sit<>stands using RW with close supervision. Pt will benefit from continued inpatient follow up therapy, <3 hours/day.    If plan is discharge home, recommend the following: A lot of help with bathing/dressing/bathroom;Assistance with cooking/housework;Assist for transportation;Help with stairs or ramp for entrance;A little help with walking and/or transfers;Supervision due to cognitive status;Direct supervision/assist for medications management   Can travel by private vehicle     Yes  Equipment Recommendations  None recommended by PT (Pt already has needed DME)    Recommendations for Other Services       Precautions / Restrictions Precautions Precautions: Fall Recall of Precautions/Restrictions: Intact Restrictions Weight Bearing Restrictions Per Provider Order: No     Mobility  Bed Mobility               General bed mobility comments: Not assessed. Pt greeted seated in recliner chair and returned there at end of session.    Transfers Overall transfer level: Needs assistance Equipment used: Rolling walker (2 wheels) Transfers: Sit to/from Stand Sit to Stand: Contact guard assist, Supervision           General transfer comment: Pt stood from recliner chair. He  demonstrated proper hand placement using RW. Pt powered up with CGA. He completed 3 sit<>stands from recliner chair with close supervision. Good eccentric control.    Ambulation/Gait Ambulation/Gait assistance: Contact guard assist, +2 safety/equipment (chair follow) Gait Distance (Feet): 50 Feet (x2, seated rest in between bouts) Assistive device: Rolling walker (2 wheels) Gait Pattern/deviations: Step-to pattern, Decreased step length - right, Decreased step length - left, Trunk flexed, Decreased dorsiflexion - right, Decreased dorsiflexion - left Gait velocity: decreased Gait velocity interpretation: <1.31 ft/sec, indicative of household ambulator   General Gait Details: Pt ambulated with short slow steps with minimal foot clearance. He demonstrated a slight fwd lean, cues for upright posture and closer proximity to RW. Instructed pt to take longer steps to pass opposite foot, but he was unable. No LOB.   Stairs             Wheelchair Mobility     Tilt Bed    Modified Rankin (Stroke Patients Only)       Balance Overall balance assessment: Needs assistance Sitting-balance support: Feet supported, Bilateral upper extremity supported Sitting balance-Leahy Scale: Fair Sitting balance - Comments: sitting in recliner chair   Standing balance support: Bilateral upper extremity supported, During functional activity, Reliant on assistive device for balance Standing balance-Leahy Scale: Poor Standing balance comment: Pt dependent on RW                            Communication Communication Communication: No apparent difficulties  Cognition Arousal: Alert Behavior During Therapy: WFL for tasks assessed/performed   PT - Cognitive impairments: History of cognitive impairments (  Dementia)                         Following commands: Intact      Cueing Cueing Techniques: Verbal cues, Gestural cues  Exercises      General Comments General comments  (skin integrity, edema, etc.): VSS on RA      Pertinent Vitals/Pain Pain Assessment Pain Assessment: No/denies pain    Home Living                          Prior Function            PT Goals (current goals can now be found in the care plan section) Acute Rehab PT Goals Patient Stated Goal: Return Home and be independent again PT Goal Formulation: With patient Time For Goal Achievement: 12/10/24 Potential to Achieve Goals: Good Progress towards PT goals: Goals updated;Progressing toward goals    Frequency    Min 2X/week      PT Plan      Co-evaluation              AM-PAC PT 6 Clicks Mobility   Outcome Measure  Help needed turning from your back to your side while in a flat bed without using bedrails?: A Little Help needed moving from lying on your back to sitting on the side of a flat bed without using bedrails?: A Little Help needed moving to and from a bed to a chair (including a wheelchair)?: A Little Help needed standing up from a chair using your arms (e.g., wheelchair or bedside chair)?: A Little Help needed to walk in hospital room?: A Little Help needed climbing 3-5 steps with a railing? : A Lot 6 Click Score: 17    End of Session Equipment Utilized During Treatment: Gait belt Activity Tolerance: Patient tolerated treatment well Patient left: in chair;with chair alarm set;with call bell/phone within reach Nurse Communication: Mobility status PT Visit Diagnosis: Other abnormalities of gait and mobility (R26.89);Muscle weakness (generalized) (M62.81);Unsteadiness on feet (R26.81)     Time: 8599-8576 PT Time Calculation (min) (ACUTE ONLY): 23 min  Charges:    $Gait Training: 23-37 mins PT General Charges $$ ACUTE PT VISIT: 1 Visit                     Randall SAUNDERS, PT, DPT Acute Rehabilitation Services Office: [phone] Secure Chat Preferred  [name] 11/26/2024, 3:25 PM

## 2024-11-26 NOTE — Progress Notes (Addendum)
 " PROGRESS NOTE    Raymond Gordon  FMW:[id number] DOB: Sep 09, 1951 DOA: 11/12/2024 PCP: [name], MD  Chief Complaint  Patient presents with   Stroke Symptoms    Brief Narrative:    74 y.o. male with medical history significant of DMII, Gout, afib, HTN, OSA, Gout, CKD IIIa, Anemia of chronic disease, Dementia, BPH chronic indwelling foley, HLD, Hfpef.    He was found by family, unable to get up from bathroom.  L leg weakness and L arm weakness noted.    Assessment & Plan:   Principal Problem:   TIA (transient ischemic attack)  Acute Metabolic Encephalopathy Delirium  Concern for underlying cognitive deficit - with delirium, suspect underlying cognitive deficits, question undiagnosed dementia - thiamine  level wnl, b12 level wnl, TSH wnl, b1 wnl - follow ammonia wnl, venous blood gas (without hypercarbia) - MRI brain on 11/12/2024 was unremarkable - will resume trazodone  nightly.  May need to add seroquel.  - continue nightly CPAP -EEG was negative for seizure or epileptiform discharges. -UA not concerning for UTI.  -neurology signed off 12/23 - note significant hypertension yesterday during his sundowning episode, hypertensive encephalopathy is possible.  Managing as below.  - needs outpatient neurology follow up   TIA Stroke Like Symptoms Presented with episode of slurred speech and right facial droop Seen by neurology, suspected left leg weakness is due to small stroke few months ago MRI brain without any acute abnormality, chronic ischemic changes, remote lacunar infarcts CTA head and neck stable from 11/25, bulky calcified plaque about L carotid bulb/proximal L ICA with severe 80% stenosis by NASCET criteria - occluded L vertebral artery continue with eliquis  per neurology PT/OT/SLP Needs outpatient neurology follow up   Acute kidney injury - resolved   Bradycardia - Heart rate has been in low 50s - hold metop    Left lower extremity weakness   neurology suspects the L leg weakness is related to Corinne Goucher small stroke Jenne Sellinger few months ago  MRI of C/T/L spine without findings that look like they're related, neurology notes MRI spine does not convincingly explain L leg weakness per neuro will need outpatient EMG/NCS PT/OT   Degenerative Disc Disease Mild to moderate Diffuse Spinal Stenosis at C2-3 through C6-7 Disc Bulge with Facet Hypertrophy at L4-5 with L worse than R Lateral Recess Stenosis with moderate L worse than R L4 Foraminal Narrowing  Disc Protrusion at L3-4  Bilateral Facet Hypertrophy at L3-4 through L5-S1 -consider ortho/spine consult referral outpatient -pt/ot    Mild hyperkalemia Resolved   HFpEF Euvolemic on exam metoprolol /cozaar  Lasix  currently on hold   DMII SSI lantus  18 units CBG well-controlled   Gout allopurinol     P Afib continue on eliquis   HTN Losartan  Amlodipine  increased to 10 mg today - trend Metoprolol  on hold due to bradycardia above  CKDIIIa -at baseline    BPH  -continue finasteride     HLD -continue statin    OSA cpap at bedtime  Class II Obesity Body mass index is 39.32 kg/m.  Appeal has been denied.  Sister says no one able to stay with him, working on personal assistant.  Asking therapy to reevaluate so we can see where he is now.  Discharge pending safe discharge plan.    DVT prophylaxis: eliquis  Code Status: full Family Communication: 3 sisters at bedside 1/2 Disposition:   Status is: Inpatient Remains inpatient appropriate because: need for continued inpatient care   Consultants:  neurology  Procedures:  Echo IMPRESSIONS  1. Left ventricular ejection fraction, by estimation, is 55 to 60%. The  left ventricle has normal function. The left ventricle has no regional  wall motion abnormalities. There is mild concentric left ventricular  hypertrophy. Left ventricular diastolic  parameters are consistent with Grade I diastolic dysfunction (impaired   relaxation).   2. Right ventricular systolic function is normal. The right ventricular  size is normal. Tricuspid regurgitation signal is inadequate for assessing  PA pressure.   3. The mitral valve is normal in structure. No evidence of mitral valve  regurgitation. No evidence of mitral stenosis.   4. The aortic valve is normal in structure. Aortic valve regurgitation is  not visualized. No aortic stenosis is present.   5. The inferior vena cava is normal in size with greater than 50%  respiratory variability, suggesting right atrial pressure of 3 mmHg.   Antimicrobials:  Anti-infectives (From admission, onward)    None       Subjective: No new complaints  Objective: Vitals:   11/26/24 0040 11/26/24 0411 11/26/24 0820 11/26/24 1208  BP: (!) 153/59 (!) 191/62 (!) 134/52 (!) 110/53  Pulse: 81 79 72 64  Resp: 17 20 18 18   Temp: 97.9 F (36.6 C) 97.7 F (36.5 C) 99 F (37.2 C) 98.7 F (37.1 C)  TempSrc: Oral Oral Oral Oral  SpO2: 100% 100% 100% 99%  Weight:      Height:        Intake/Output Summary (Last 24 hours) at 11/26/2024 1428 Last data filed at 11/26/2024 0900 Gross per 24 hour  Intake 240 ml  Output 600 ml  Net -360 ml    Filed Weights   11/15/24 1119  Weight: 103.9 kg    Examination:  General: No acute distress. Cardiovascular: RRR Lungs: unlabored Neurological: Alert, but disoriented, says he's at [employer].  Knows month and year. Moves all extremities 4 . Cranial nerves II through XII grossly intact. Extremities: No clubbing or cyanosis. No edema.   Data Reviewed: I have personally reviewed following labs and imaging studies  CBC: Recent Labs  Lab 11/21/24 0928 11/22/24 0148 11/26/24 1003  WBC 5.4 5.9 4.9  NEUTROABS  --  3.2  --   HGB 12.9* 12.8* 12.5*  HCT 40.0 40.0 38.7*  MCV 89.9 90.5 89.2  PLT 292 267 300    Basic Metabolic Panel: Recent Labs  Lab 11/20/24 0145 11/21/24 0928 11/22/24 0148 11/26/24 1003  NA 142 144 143 138  K 4.1  4.7 4.7 4.6  CL 109 111 110 108  CO2 26 24 25  21*  GLUCOSE 147* 73 115* 134*  BUN 50* 31* 34* 38*  CREATININE 1.16 0.87 1.25* 1.14  CALCIUM  9.3 9.7 9.5 9.5  MG  --   --  2.3  --   PHOS  --   --  3.2  --     GFR: Estimated Creatinine Clearance: 62 mL/min (by C-G formula based on SCr of 1.14 mg/dL).  Liver Function Tests: Recent Labs  Lab 11/22/24 0148  AST 24  ALT 21  ALKPHOS 101  BILITOT 0.3  PROT 7.3  ALBUMIN 3.4*    CBG: Recent Labs  Lab 11/25/24 1129 11/25/24 1614 11/25/24 2130 11/26/24 0611 11/26/24 1132  GLUCAP 179* 192* 196* 105* 110*     No results found for this or any previous visit (from the past 240 hours).       Radiology Studies: No results found.      Scheduled Meds:  allopurinol   300 mg Oral  q AM   amLODipine   10 mg Oral Daily   apixaban   5 mg Oral BID   aspirin   81 mg Oral Daily   atorvastatin   80 mg Oral QHS   finasteride   5 mg Oral QPM   insulin  aspart  0-5 Units Subcutaneous QHS   insulin  aspart  0-9 Units Subcutaneous TID WC   insulin  glargine  18 Units Subcutaneous QHS   losartan   100 mg Oral Daily   pantoprazole   40 mg Oral Daily   thiamine   100 mg Oral Daily   traZODone   25 mg Oral QHS   Continuous Infusions:   LOS: 10 days    Time spent: over 30 min     Meliton Monte, MD [facility]   To contact the attending provider between 7A-7P or the covering provider during after hours 7P-7A, please log into the web site [email or web address] and access using universal [place] password for that web site. If you do not have the password, please call the hospital operator.  11/26/2024, 2:28 PM    "

## 2024-11-26 NOTE — TOC Progression Note (Signed)
 Transition of Care [facility]) - Progression Note    Patient Details  Name: Raymond Gordon MRN: [id number] Date of Birth: [birth year]  Transition of Care [facility]) CM/SW Contact  [name], [name] Phone Number: 11/26/2024, 10:52 AM  Clinical Narrative:   CSW spoke with sister, [name], to discuss disposition. Per [name], she was able to submit the patient's Medicaid application and received a call from [facility] with DSS this morning that it was being assigned to a caseworker, but she does not have the caseworker information yet. CSW discussed patient's needs, and confirmed that the family is not able to provide 24/7 assistance. [name] also expressed concern about whether they could handle the patient if his confusion persists, that is new and she does not know how to handle it if he were to experience overnight confusion. CSW confirmed with [name] that the patient does not own any property or vehicles, and his social security check is around $2000 per month. Patient would need Medicaid to cover LTC facility, unable to pay privately. CSW to follow.    Expected Discharge Plan: Skilled Nursing Facility Barriers to Discharge: Inadequate or no insurance, No SNF bed               Expected Discharge Plan and Services                                               Social Drivers of Health (SDOH) Interventions SDOH Screenings   Food Insecurity: No Food Insecurity (11/13/2024)  Housing: Low Risk (11/13/2024)  Transportation Needs: Unmet Transportation Needs (11/13/2024)  Utilities: Not At Risk (11/13/2024)  Social Connections: Moderately Isolated (11/13/2024)  Tobacco Use: Medium Risk (11/13/2024)    Readmission Risk Interventions     No data to display

## 2024-11-26 NOTE — Progress Notes (Signed)
 Occupational Therapy Treatment Patient Details Name: Raymond Gordon MRN: [id number] DOB: 1951-11-23 Today's Date: 11/26/2024   History of present illness Pt is a 74 y/o M who presented to [facility] ED with episode of slurred speech and RFD. MRI brain negative for acute findings. PMHx: DMII, HLD, gout, afib, HTN, CHF, OSA, CKDIII, anemia, dementia, BPH with chronic indwelling foley catheter.   OT comments  Patient demonstrating good gains with OT treatment with bed mobility, transfers, and self care. Patient able to stand at sink for bathing UB and peri area and required assistance for bathing feet. Patient could benefit from AE training for LB bathing and dressing.  Patient will benefit from continued inpatient follow up therapy, <3 hours/day.  Acute OT to continue to follow to address established goals to facilitate DC to next venue of care.        If plan is discharge home, recommend the following:  Assistance with cooking/housework;Direct supervision/assist for medications management;Direct supervision/assist for financial management;Assist for transportation;Help with stairs or ramp for entrance;A little help with walking and/or transfers;A lot of help with bathing/dressing/bathroom   Equipment Recommendations  Other (comment) (defer)    Recommendations for Other Services      Precautions / Restrictions Precautions Precautions: Fall Recall of Precautions/Restrictions: Intact Precaution/Restrictions Comments: fearful of falling Restrictions Weight Bearing Restrictions Per Provider Order: No       Mobility Bed Mobility Overal bed mobility: Needs Assistance Bed Mobility: Supine to Sit     Supine to sit: Contact guard, HOB elevated, Used rails     General bed mobility comments: increased time and effort    Transfers Overall transfer level: Needs assistance Equipment used: Rolling walker (2 wheels) Transfers: Sit to/from Stand Sit to Stand: Min assist           General  transfer comment: min assist to power up and to steady for transfers     Balance Overall balance assessment: Needs assistance Sitting-balance support: Feet supported, Bilateral upper extremity supported Sitting balance-Leahy Scale: Fair Sitting balance - Comments: supervision sitting EOB   Standing balance support: Single extremity supported, Bilateral upper extremity supported, During functional activity Standing balance-Leahy Scale: Poor Standing balance comment: reliant on at least one extremity support for static standing                           ADL either performed or assessed with clinical judgement   ADL Overall ADL's : Needs assistance/impaired     Grooming: Wash/dry hands;Wash/dry face;Oral care;Contact guard assist;Standing Grooming Details (indicate cue type and reason): at sink Upper Body Bathing: Contact guard assist;Standing   Lower Body Bathing: Minimal assistance;Sit to/from stand Lower Body Bathing Details (indicate cue type and reason): stood for peri area bathing, required assistance for feet Upper Body Dressing : Minimal assistance;Sitting Upper Body Dressing Details (indicate cue type and reason): gown change Lower Body Dressing: Moderate assistance;Sit to/from stand Lower Body Dressing Details (indicate cue type and reason): able to doff and required assistand to donn socks             Functional mobility during ADLs: Minimal assistance;Rolling walker (2 wheels)      Extremity/Trunk Assessment              Vision       Perception     Praxis     Communication Communication Communication: No apparent difficulties   Cognition Arousal: Alert Behavior During Therapy: WFL for tasks assessed/performed Cognition: No apparent  impairments             OT - Cognition Comments: oriented x4                 Following commands: Impaired Following commands impaired: Follows multi-step commands with increased time       Cueing   Cueing Techniques: Verbal cues, Gestural cues  Exercises      Shoulder Instructions       General Comments VSS on RA    Pertinent Vitals/ Pain       Pain Assessment Pain Assessment: No/denies pain  Home Living                                          Prior Functioning/Environment              Frequency  Min 2X/week        Progress Toward Goals  OT Goals(current goals can now be found in the care plan section)  Progress towards OT goals: Progressing toward goals     Plan      Co-evaluation                 AM-PAC OT 6 Clicks Daily Activity     Outcome Measure   Help from another person eating meals?: None Help from another person taking care of personal grooming?: A Little Help from another person toileting, which includes using toliet, bedpan, or urinal?: A Little Help from another person bathing (including washing, rinsing, drying)?: A Little Help from another person to put on and taking off regular upper body clothing?: A Little Help from another person to put on and taking off regular lower body clothing?: A Lot 6 Click Score: 18    End of Session Equipment Utilized During Treatment: Gait belt;Rolling walker (2 wheels)  OT Visit Diagnosis: Unsteadiness on feet (R26.81);History of falling (Z91.81);Muscle weakness (generalized) (M62.81)   Activity Tolerance Patient tolerated treatment well   Patient Left in chair;with call bell/phone within reach;with chair alarm set   Nurse Communication Mobility status        Time: 9281-9255 OT Time Calculation (min): 26 min  Charges: OT General Charges $OT Visit: 1 Visit OT Treatments $Self Care/Home Management : 23-37 mins  Dick Laine, OTA Acute Rehabilitation Services  Office [phone]   Jeb LITTIE Laine 11/26/2024, 1:02 PM

## 2024-11-27 DIAGNOSIS — G459 Transient cerebral ischemic attack, unspecified: Secondary | ICD-10-CM | POA: Diagnosis not present

## 2024-11-27 LAB — GLUCOSE, CAPILLARY
Glucose-Capillary: 78 mg/dL (ref 70–99)
Glucose-Capillary: 98 mg/dL (ref 70–99)
Glucose-Capillary: 136 mg/dL — ABNORMAL HIGH (ref 70–99)
Glucose-Capillary: 143 mg/dL — ABNORMAL HIGH (ref 70–99)

## 2024-11-27 NOTE — Plan of Care (Signed)
" °  Problem: Education: Goal: Knowledge of disease or condition will improve Outcome: Progressing Goal: Knowledge of secondary prevention will improve (MUST DOCUMENT ALL) Outcome: Progressing Goal: Knowledge of patient specific risk factors will improve (DELETE if not current risk factor) Outcome: Progressing   Problem: Ischemic Stroke/TIA Tissue Perfusion: Goal: Complications of ischemic stroke/TIA will be minimized Outcome: Progressing   Problem: Coping: Goal: Will verbalize positive feelings about self Outcome: Progressing   Problem: Self-Care: Goal: Ability to participate in self-care as condition permits will improve Outcome: Progressing   "

## 2024-11-27 NOTE — Plan of Care (Signed)
" °  Problem: Education: Goal: Knowledge of disease or condition will improve Outcome: Progressing Goal: Knowledge of secondary prevention will improve (MUST DOCUMENT ALL) Outcome: Progressing Goal: Knowledge of patient specific risk factors will improve (DELETE if not current risk factor) Outcome: Progressing   Problem: Ischemic Stroke/TIA Tissue Perfusion: Goal: Complications of ischemic stroke/TIA will be minimized Outcome: Progressing   Problem: Coping: Goal: Will verbalize positive feelings about self Outcome: Progressing Goal: Will identify appropriate support needs Outcome: Progressing   Problem: Health Behavior/Discharge Planning: Goal: Ability to manage health-related needs will improve Outcome: Progressing Goal: Goals will be collaboratively established with patient/family Outcome: Progressing   Problem: Self-Care: Goal: Ability to participate in self-care as condition permits will improve Outcome: Progressing Goal: Verbalization of feelings and concerns over difficulty with self-care will improve Outcome: Progressing   Problem: Education: Goal: Ability to describe self-care measures that may prevent or decrease complications (Diabetes Survival Skills Education) will improve Outcome: Progressing Goal: Individualized Educational Video(s) Outcome: Progressing   Problem: Coping: Goal: Ability to adjust to condition or change in health will improve Outcome: Progressing   Problem: Fluid Volume: Goal: Ability to maintain a balanced intake and output will improve Outcome: Progressing   Problem: Health Behavior/Discharge Planning: Goal: Ability to identify and utilize available resources and services will improve Outcome: Progressing Goal: Ability to manage health-related needs will improve Outcome: Progressing   Problem: Metabolic: Goal: Ability to maintain appropriate glucose levels will improve Outcome: Progressing   Problem: Nutritional: Goal: Progress toward  achieving an optimal weight will improve Outcome: Progressing   Problem: Skin Integrity: Goal: Risk for impaired skin integrity will decrease Outcome: Progressing   Problem: Tissue Perfusion: Goal: Adequacy of tissue perfusion will improve Outcome: Progressing   Problem: Education: Goal: Knowledge of General Education information will improve Description: Including pain rating scale, medication(s)/side effects and non-pharmacologic comfort measures Outcome: Progressing   Problem: Health Behavior/Discharge Planning: Goal: Ability to manage health-related needs will improve Outcome: Progressing   Problem: Clinical Measurements: Goal: Ability to maintain clinical measurements within normal limits will improve Outcome: Progressing Goal: Will remain free from infection Outcome: Progressing Goal: Diagnostic test results will improve Outcome: Progressing Goal: Respiratory complications will improve Outcome: Progressing Goal: Cardiovascular complication will be avoided Outcome: Progressing   Problem: Activity: Goal: Risk for activity intolerance will decrease Outcome: Progressing   Problem: Nutrition: Goal: Adequate nutrition will be maintained Outcome: Progressing   Problem: Coping: Goal: Level of anxiety will decrease Outcome: Progressing   Problem: Elimination: Goal: Will not experience complications related to bowel motility Outcome: Progressing Goal: Will not experience complications related to urinary retention Outcome: Progressing   Problem: Pain Managment: Goal: General experience of comfort will improve and/or be controlled Outcome: Progressing   Problem: Safety: Goal: Ability to remain free from injury will improve Outcome: Progressing   Problem: Skin Integrity: Goal: Risk for impaired skin integrity will decrease Outcome: Progressing   "

## 2024-11-27 NOTE — Progress Notes (Signed)
 " PROGRESS NOTE    Raymond Gordon  FMW:[id number] DOB: November 06, 1951 DOA: 11/12/2024 PCP: [name], MD  Chief Complaint  Patient presents with   Stroke Symptoms    Brief Narrative:    74 y.o. male with medical history significant of DMII, Gout, afib, HTN, OSA, Gout, CKD IIIa, Anemia of chronic disease, Dementia, BPH chronic indwelling foley, HLD, Hfpef.    He was found by family, unable to get up from bathroom.  L leg weakness and L arm weakness noted.    Seen by neurology who suspects L leg weakness is chronic.  Recommending outpatient follow up.  Plan was for snf, but unfortunately denied.  Now working on safe discharge plan.  Hospitalization complicated by encephalopathy/sundowning.    Assessment & Plan:   Principal Problem:   TIA (transient ischemic attack)  Acute Metabolic Encephalopathy Delirium  Concern for underlying cognitive deficit - with delirium, suspect underlying cognitive deficits (sundowning with undiagnosed dementia) vs hypertensive encephalopathy vs other - Ima Hafner&Ox3 today - thiamine  level wnl, b12 level wnl, TSH wnl, b1 wnl - follow ammonia wnl, venous blood gas (without hypercarbia) - MRI brain on 11/12/2024 was unremarkable - trazodone  nightly.   - continue nightly CPAP -EEG was negative for seizure or epileptiform discharges. -UA not concerning for UTI.  -neurology signed off 12/23 - note significant hypertension during his sundowning episode, hypertensive encephalopathy is possible.  Managing as below.  - needs outpatient neurology follow up   TIA Stroke Like Symptoms Presented with episode of slurred speech and right facial droop Seen by neurology, suspected left leg weakness is due to small stroke few months ago MRI brain without any acute abnormality, chronic ischemic changes, remote lacunar infarcts CTA head and neck stable from 11/25, bulky calcified plaque about L carotid bulb/proximal L ICA with severe 80% stenosis by NASCET criteria -  occluded L vertebral artery continue with eliquis  per neurology PT/OT/SLP Needs outpatient neurology follow up   Acute kidney injury - resolved   Bradycardia - Heart rate has been in low 50s - hold metop    Left lower extremity weakness  neurology suspects the L leg weakness is related to Keneshia Tena small stroke Taavi Hoose few months ago  MRI of C/T/L spine without findings that look like they're related, neurology notes MRI spine does not convincingly explain L leg weakness per neuro will need outpatient EMG/NCS PT/OT   Degenerative Disc Disease Mild to moderate Diffuse Spinal Stenosis at C2-3 through C6-7 Disc Bulge with Facet Hypertrophy at L4-5 with L worse than R Lateral Recess Stenosis with moderate L worse than R L4 Foraminal Narrowing  Disc Protrusion at L3-4  Bilateral Facet Hypertrophy at L3-4 through L5-S1 -consider ortho/spine consult referral outpatient -pt/ot    Mild hyperkalemia Resolved   HFpEF Euvolemic on exam metoprolol /cozaar  Lasix  currently on hold   DMII SSI lantus  18 units CBG well-controlled   Gout allopurinol     P Afib continue on eliquis   HTN Losartan  Amlodipine  increased to 10 mg today - trend Metoprolol  on hold due to bradycardia above  CKDIIIa -at baseline    BPH  -continue finasteride     HLD -continue statin    OSA cpap at bedtime  Class II Obesity Body mass index is 39.32 kg/m.  Appeal has been denied.  Sister says no one able to stay with him, working on personal assistant.  Asking therapy to reevaluate so we can see where he is now.  Discharge pending safe discharge plan.    DVT prophylaxis: eliquis   Code Status: full Family Communication: 3 sisters at bedside 1/2 Disposition:   Status is: Inpatient Remains inpatient appropriate because: need for continued inpatient care   Consultants:  neurology  Procedures:  Echo IMPRESSIONS     1. Left ventricular ejection fraction, by estimation, is 55 to 60%. The  left  ventricle has normal function. The left ventricle has no regional  wall motion abnormalities. There is mild concentric left ventricular  hypertrophy. Left ventricular diastolic  parameters are consistent with Grade I diastolic dysfunction (impaired  relaxation).   2. Right ventricular systolic function is normal. The right ventricular  size is normal. Tricuspid regurgitation signal is inadequate for assessing  PA pressure.   3. The mitral valve is normal in structure. No evidence of mitral valve  regurgitation. No evidence of mitral stenosis.   4. The aortic valve is normal in structure. Aortic valve regurgitation is  not visualized. No aortic stenosis is present.   5. The inferior vena cava is normal in size with greater than 50%  respiratory variability, suggesting right atrial pressure of 3 mmHg.   Antimicrobials:  Anti-infectives (From admission, onward)    None       Subjective: No new complaints  Objective: Vitals:   11/27/24 0001 11/27/24 0519 11/27/24 0738 11/27/24 1208  BP: (!) 105/31 (!) 149/124 (!) 102/41 (!) 120/47  Pulse: (!) 58 (!) 56 (!) 50 (!) 53  Resp: 15 14 17 18   Temp: 97.6 F (36.4 C) (!) 97.5 F (36.4 C) 98.4 F (36.9 C) 98.6 F (37 C)  TempSrc: Oral Oral    SpO2: 100% 100% 96% 98%  Weight:      Height:        Intake/Output Summary (Last 24 hours) at 11/27/2024 1215 Last data filed at 11/27/2024 0000 Gross per 24 hour  Intake 240 ml  Output --  Net 240 ml    Filed Weights   11/15/24 1119  Weight: 103.9 kg    Examination:  General: No acute distress. Cardiovascular: RRR Lungs: unlabored Neurological: Alert, but disoriented, says he's at [employer].  Knows month and year. Moves all extremities 4 . Cranial nerves II through XII grossly intact. Extremities: No clubbing or cyanosis. No edema.   Data Reviewed: I have personally reviewed following labs and imaging studies  CBC: Recent Labs  Lab 11/21/24 0928 11/22/24 0148 11/26/24 1003  WBC  5.4 5.9 4.9  NEUTROABS  --  3.2  --   HGB 12.9* 12.8* 12.5*  HCT 40.0 40.0 38.7*  MCV 89.9 90.5 89.2  PLT 292 267 300    Basic Metabolic Panel: Recent Labs  Lab 11/21/24 0928 11/22/24 0148 11/26/24 1003  NA 144 143 138  K 4.7 4.7 4.6  CL 111 110 108  CO2 24 25 21*  GLUCOSE 73 115* 134*  BUN 31* 34* 38*  CREATININE 0.87 1.25* 1.14  CALCIUM  9.7 9.5 9.5  MG  --  2.3  --   PHOS  --  3.2  --     GFR: Estimated Creatinine Clearance: 62 mL/min (by C-G formula based on SCr of 1.14 mg/dL).  Liver Function Tests: Recent Labs  Lab 11/22/24 0148  AST 24  ALT 21  ALKPHOS 101  BILITOT 0.3  PROT 7.3  ALBUMIN 3.4*    CBG: Recent Labs  Lab 11/26/24 1132 11/26/24 1647 11/26/24 2115 11/27/24 0622 11/27/24 1113  GLUCAP 110* 189* 146* 78 98     No results found for this or any previous visit (from the  past 240 hours).       Radiology Studies: No results found.      Scheduled Meds:  allopurinol   300 mg Oral q AM   amLODipine   10 mg Oral Daily   apixaban   5 mg Oral BID   aspirin   81 mg Oral Daily   atorvastatin   80 mg Oral QHS   finasteride   5 mg Oral QPM   insulin  aspart  0-5 Units Subcutaneous QHS   insulin  aspart  0-9 Units Subcutaneous TID WC   insulin  glargine  18 Units Subcutaneous QHS   losartan   100 mg Oral Daily   pantoprazole   40 mg Oral Daily   thiamine   100 mg Oral Daily   traZODone   25 mg Oral QHS   Continuous Infusions:   LOS: 11 days    Time spent: over 30 min     Meliton Monte, MD [facility]   To contact the attending provider between 7A-7P or the covering provider during after hours 7P-7A, please log into the web site [email or web address] and access using universal [place] password for that web site. If you do not have the password, please call the hospital operator.  11/27/2024, 12:15 PM    "

## 2024-11-28 DIAGNOSIS — G459 Transient cerebral ischemic attack, unspecified: Secondary | ICD-10-CM | POA: Diagnosis not present

## 2024-11-28 DIAGNOSIS — R29898 Other symptoms and signs involving the musculoskeletal system: Secondary | ICD-10-CM | POA: Diagnosis not present

## 2024-11-28 DIAGNOSIS — I480 Paroxysmal atrial fibrillation: Secondary | ICD-10-CM | POA: Diagnosis not present

## 2024-11-28 LAB — GLUCOSE, CAPILLARY
Glucose-Capillary: 142 mg/dL — ABNORMAL HIGH (ref 70–99)
Glucose-Capillary: 120 mg/dL — ABNORMAL HIGH (ref 70–99)
Glucose-Capillary: 139 mg/dL — ABNORMAL HIGH (ref 70–99)
Glucose-Capillary: 156 mg/dL — ABNORMAL HIGH (ref 70–99)

## 2024-11-28 MED ORDER — INSULIN GLARGINE-YFGN 100 UNIT/ML ~~LOC~~ SOLN
18.0000 [IU] | Freq: Every day | SUBCUTANEOUS | Status: DC
  Administered 2024-11-29: 18 [IU] via SUBCUTANEOUS
  Filled 2024-11-28 (×2): qty 0.18

## 2024-11-28 NOTE — Plan of Care (Signed)
   Problem: Coping: Goal: Will verbalize positive feelings about self Outcome: Progressing

## 2024-11-28 NOTE — Plan of Care (Signed)
" °  Problem: Education: Goal: Knowledge of disease or condition will improve Outcome: Progressing Goal: Knowledge of secondary prevention will improve (MUST DOCUMENT ALL) Outcome: Progressing Goal: Knowledge of patient specific risk factors will improve (DELETE if not current risk factor) Outcome: Progressing   Problem: Ischemic Stroke/TIA Tissue Perfusion: Goal: Complications of ischemic stroke/TIA will be minimized Outcome: Progressing   Problem: Coping: Goal: Will verbalize positive feelings about self Outcome: Progressing Goal: Will identify appropriate support needs Outcome: Progressing   Problem: Health Behavior/Discharge Planning: Goal: Ability to manage health-related needs will improve Outcome: Progressing Goal: Goals will be collaboratively established with patient/family Outcome: Progressing   Problem: Self-Care: Goal: Ability to participate in self-care as condition permits will improve Outcome: Progressing Goal: Verbalization of feelings and concerns over difficulty with self-care will improve Outcome: Progressing   Problem: Education: Goal: Ability to describe self-care measures that may prevent or decrease complications (Diabetes Survival Skills Education) will improve Outcome: Progressing Goal: Individualized Educational Video(s) Outcome: Progressing   Problem: Coping: Goal: Ability to adjust to condition or change in health will improve Outcome: Progressing   Problem: Fluid Volume: Goal: Ability to maintain a balanced intake and output will improve Outcome: Progressing   Problem: Health Behavior/Discharge Planning: Goal: Ability to identify and utilize available resources and services will improve Outcome: Progressing Goal: Ability to manage health-related needs will improve Outcome: Progressing   Problem: Metabolic: Goal: Ability to maintain appropriate glucose levels will improve Outcome: Progressing   Problem: Nutritional: Goal: Progress toward  achieving an optimal weight will improve Outcome: Progressing   Problem: Skin Integrity: Goal: Risk for impaired skin integrity will decrease Outcome: Progressing   Problem: Tissue Perfusion: Goal: Adequacy of tissue perfusion will improve Outcome: Progressing   Problem: Education: Goal: Knowledge of General Education information will improve Description: Including pain rating scale, medication(s)/side effects and non-pharmacologic comfort measures Outcome: Progressing   Problem: Health Behavior/Discharge Planning: Goal: Ability to manage health-related needs will improve Outcome: Progressing   Problem: Clinical Measurements: Goal: Ability to maintain clinical measurements within normal limits will improve Outcome: Progressing Goal: Will remain free from infection Outcome: Progressing Goal: Diagnostic test results will improve Outcome: Progressing Goal: Respiratory complications will improve Outcome: Progressing Goal: Cardiovascular complication will be avoided Outcome: Progressing   Problem: Activity: Goal: Risk for activity intolerance will decrease Outcome: Progressing   Problem: Nutrition: Goal: Adequate nutrition will be maintained Outcome: Progressing   Problem: Coping: Goal: Level of anxiety will decrease Outcome: Progressing   Problem: Elimination: Goal: Will not experience complications related to bowel motility Outcome: Progressing Goal: Will not experience complications related to urinary retention Outcome: Progressing   Problem: Pain Managment: Goal: General experience of comfort will improve and/or be controlled Outcome: Progressing   Problem: Safety: Goal: Ability to remain free from injury will improve Outcome: Progressing   Problem: Skin Integrity: Goal: Risk for impaired skin integrity will decrease Outcome: Progressing   "

## 2024-11-28 NOTE — Progress Notes (Signed)
 ``[facility]  PROGRESS NOTE  Raymond Gordon FMW:[id number] DOB: February 25, 1951 DOA: 11/12/2024 PCP: [name], MD   Brief HPI:    74 y.o. male with medical history significant of DMII, Gout, afib, HTN, OSA, Gout, CKD IIIa, Anemia of chronic disease, Dementia, BPH chronic indwelling foley, HLD, Hfpef.    He was found by family, unable to get up from bathroom.  L leg weakness and L arm weakness noted.        Assessment/Plan:    Confusion/?  Sundowning -Mental status has been improving - workup has been negative - BP level 905, TSH 3.4 - MRI brain on 11/12/2024 was unremarkable - Checked thiamine  level 131, started on thiamine  100 mg daily -Started on trazodone  100 mg nightly, dose was changed to 25 mg due to somnolence .   -Seen by neurology, recommended to start CPAP at bedtime.  CPAP nightly ordered -EEG was negative for seizure or epileptiform discharges. -UA not concerning for UTI.  - Neurology signed off on 12/23  TIA Stroke Like Symptoms -Presented with episode of slurred speech and right facial droop - Seen by neurology, suspected left leg weakness is due to small stroke few months ago -MRI brain without any acute abnormality, chronic ischemic changes, remote lacunar infarcts -CTA head and neck stable from 11/25, bulky calcified plaque about L carotid bulb/proximal L ICA with severe 80% stenosis by NASCET criteria - occluded L vertebral artery continue with eliquis  per neurology PT/OT/SLP Needs outpatient neurology follow up  Acute kidney injury - Resolved    Bradycardia - Heart rate has been in low 50s - Metoprolol  was held due to bradycardia    Left lower extremity weakness  neurology suspects the L leg weakness is related to a small stroke a few months ago  MRI of C/T/L spine without findings that look like they're related, neurology notes MRI spine does not convincingly explain L leg weakness per neuro will need out patient EMG/NCS PT/OT    Degenerative Disc Disease Mild to moderate Diffuse Spinal Stenosis at C2-3 through C6-7 Disc Bulge with Facet Hypertrophy at L4-5 with L worse than R Lateral Recess Stenosis with moderate L worse than R L4 Foraminal Narrowing  Disc Protrusion at L3-4  Bilateral Facet Hypertrophy at L3-4 through L5-S1 -consider ortho/spine consult referral outpatient -pt/ot    Mild hyperkalemia Resolved   HFpEF Euvolemic on exam metoprolol /cozaar /lasix   - Lasix  on hold due to acute kidney injury as above -Follow BMP in am   DMII SSI lantus  18 units CBG well-controlled   Gout allopurinol     P Afib continue on eliquis  currently in sinus    HTN Losartan , metoprolol  - Metoprolol  discontinued due to bradycardia as above -Continue amlodipine  10 mg daily    OSA cpap at bedtime    CKDIIIa -at baseline      BPH  -continue finasteride     HLD -continue statin       DVT prophylaxis: Apixaban   Medications     allopurinol   300 mg Oral q AM   amLODipine   10 mg Oral Daily   apixaban   5 mg Oral BID   aspirin   81 mg Oral Daily   atorvastatin   80 mg Oral QHS   finasteride   5 mg Oral QPM   insulin  aspart  0-5 Units Subcutaneous QHS   insulin  aspart  0-9 Units Subcutaneous TID WC   insulin  glargine  18 Units Subcutaneous QHS   losartan   100 mg Oral Daily   pantoprazole   40 mg  Oral Daily   thiamine   100 mg Oral Daily   traZODone   25 mg Oral QHS     Data Reviewed:   CBG:  Recent Labs  Lab 11/27/24 0622 11/27/24 1113 11/27/24 1702 11/27/24 2154 11/28/24 0603  GLUCAP 78 98 136* 143* 142*    SpO2: 99 % FiO2 (%): 21 %    Vitals:   11/27/24 1556 11/28/24 0045 11/28/24 0429 11/28/24 0906  BP: (!) 120/53 (!) 118/37 (!) 112/58 (!) 132/38  Pulse: (!) 55 (!) 48 (!) 48 (!) 50  Resp: 15 14 14 19   Temp: 98.7 F (37.1 C) 99.6 F (37.6 C) 99.1 F (37.3 C)   TempSrc:  Oral Oral   SpO2: 100% 99% 100% 99%  Weight:      Height:          Data Reviewed:  Basic  Metabolic Panel: Recent Labs  Lab 11/22/24 0148 11/26/24 1003  NA 143 138  K 4.7 4.6  CL 110 108  CO2 25 21*  GLUCOSE 115* 134*  BUN 34* 38*  CREATININE 1.25* 1.14  CALCIUM  9.5 9.5  MG 2.3  --   PHOS 3.2  --     CBC: Recent Labs  Lab 11/22/24 0148 11/26/24 1003  WBC 5.9 4.9  NEUTROABS 3.2  --   HGB 12.8* 12.5*  HCT 40.0 38.7*  MCV 90.5 89.2  PLT 267 300    LFT Recent Labs  Lab 11/22/24 0148  AST 24  ALT 21  ALKPHOS 101  BILITOT 0.3  PROT 7.3  ALBUMIN 3.4*     Antibiotics: Anti-infectives (From admission, onward)    None        CONSULTS   Code Status: Full code  Family Communication: Discussed with patient's sister, insurance company has denied patient going to skilled facility.  Family is going to appeal the decision    Subjective   Denies any complaints  Objective    Physical Examination:  Appears in no acute distress S1-S2, regular Lungs clear to auscultation bilaterally Abdomen is soft, nontender, no organomegaly          Raymond Gordon S Raymond Gordon   [facility] If 7PM-7AM, please contact night-coverage at [email or web address], Office  [phone]   11/28/2024, 9:38 AM  LOS: 12 days

## 2024-11-29 DIAGNOSIS — I480 Paroxysmal atrial fibrillation: Secondary | ICD-10-CM | POA: Diagnosis not present

## 2024-11-29 DIAGNOSIS — G459 Transient cerebral ischemic attack, unspecified: Secondary | ICD-10-CM | POA: Diagnosis not present

## 2024-11-29 DIAGNOSIS — R29898 Other symptoms and signs involving the musculoskeletal system: Secondary | ICD-10-CM | POA: Diagnosis not present

## 2024-11-29 LAB — GLUCOSE, CAPILLARY
Glucose-Capillary: 100 mg/dL — ABNORMAL HIGH (ref 70–99)
Glucose-Capillary: 96 mg/dL (ref 70–99)
Glucose-Capillary: 112 mg/dL — ABNORMAL HIGH (ref 70–99)
Glucose-Capillary: 159 mg/dL — ABNORMAL HIGH (ref 70–99)

## 2024-11-29 LAB — BASIC METABOLIC PANEL WITH GFR
Anion gap: 9 (ref 5–15)
BUN: 45 mg/dL — ABNORMAL HIGH (ref 8–23)
CO2: 18 mmol/L — ABNORMAL LOW (ref 22–32)
Calcium: 9.2 mg/dL (ref 8.9–10.3)
Chloride: 107 mmol/L (ref 98–111)
Creatinine, Ser: 1.14 mg/dL (ref 0.61–1.24)
GFR, Estimated: >60 mL/min
Glucose, Bld: 113 mg/dL — ABNORMAL HIGH (ref 70–99)
Potassium: 5.4 mmol/L — ABNORMAL HIGH (ref 3.5–5.1)
Sodium: 135 mmol/L (ref 135–145)

## 2024-11-29 MED ORDER — SODIUM ZIRCONIUM CYCLOSILICATE 10 G PO PACK
10.0000 g | PACK | Freq: Once | ORAL | Status: AC
  Administered 2024-11-29: 10 g via ORAL
  Filled 2024-11-29: qty 1

## 2024-11-29 MED ORDER — HYDROCERIN EX CREA
TOPICAL_CREAM | Freq: Two times a day (BID) | CUTANEOUS | Status: DC
  Administered 2024-11-30: 1 via TOPICAL
  Filled 2024-11-29: qty 113

## 2024-11-29 NOTE — Plan of Care (Signed)
   Problem: Coping: Goal: Will verbalize positive feelings about self Outcome: Progressing

## 2024-11-29 NOTE — TOC Progression Note (Signed)
 Transition of Care [facility]) - Progression Note    Patient Details  Name: Raymond Gordon MRN: [id number] Date of Birth: 02-26-1951  [facility]) CM/SW Contact  [name], [name] Phone Number: 11/29/2024, 11:30 AM  Clinical Narrative:   CSW received call from [facility] [employer] that second review of the appeal has been completed and it was overturned, patient has been approved for SNF. Auth number is [id number]. ROME barrows number is [id number]. CSW contacted [facility], they do not have a bed available today but will update CSW when bed is available. CSW spoke with sister, [name], to update, she is appreciative of information. CSW to follow.    Expected Discharge Plan: Skilled Nursing Facility Barriers to Discharge: No SNF bed               Expected Discharge Plan and Services                                               Social Drivers of Health (SDOH) Interventions SDOH Screenings   Food Insecurity: No Food Insecurity (11/13/2024)  Housing: Low Risk (11/13/2024)  Transportation Needs: Unmet Transportation Needs (11/13/2024)  Utilities: Not At Risk (11/13/2024)  Social Connections: Moderately Isolated (11/13/2024)  Tobacco Use: Medium Risk (11/13/2024)    Readmission Risk Interventions     No data to display

## 2024-11-29 NOTE — Plan of Care (Signed)
" °  Problem: Education: Goal: Knowledge of disease or condition will improve Outcome: Progressing Goal: Knowledge of secondary prevention will improve (MUST DOCUMENT ALL) Outcome: Progressing Goal: Knowledge of patient specific risk factors will improve (DELETE if not current risk factor) Outcome: Progressing   Problem: Ischemic Stroke/TIA Tissue Perfusion: Goal: Complications of ischemic stroke/TIA will be minimized Outcome: Progressing   Problem: Coping: Goal: Will verbalize positive feelings about self Outcome: Progressing Goal: Will identify appropriate support needs Outcome: Progressing   Problem: Health Behavior/Discharge Planning: Goal: Ability to manage health-related needs will improve Outcome: Progressing Goal: Goals will be collaboratively established with patient/family Outcome: Progressing   Problem: Self-Care: Goal: Ability to participate in self-care as condition permits will improve Outcome: Progressing Goal: Verbalization of feelings and concerns over difficulty with self-care will improve Outcome: Progressing   Problem: Education: Goal: Ability to describe self-care measures that may prevent or decrease complications (Diabetes Survival Skills Education) will improve Outcome: Progressing Goal: Individualized Educational Video(s) Outcome: Progressing   Problem: Coping: Goal: Ability to adjust to condition or change in health will improve Outcome: Progressing   Problem: Fluid Volume: Goal: Ability to maintain a balanced intake and output will improve Outcome: Progressing   Problem: Health Behavior/Discharge Planning: Goal: Ability to identify and utilize available resources and services will improve Outcome: Progressing Goal: Ability to manage health-related needs will improve Outcome: Progressing   Problem: Metabolic: Goal: Ability to maintain appropriate glucose levels will improve Outcome: Progressing   Problem: Nutritional: Goal: Progress toward  achieving an optimal weight will improve Outcome: Progressing   Problem: Skin Integrity: Goal: Risk for impaired skin integrity will decrease Outcome: Progressing   Problem: Tissue Perfusion: Goal: Adequacy of tissue perfusion will improve Outcome: Progressing   Problem: Education: Goal: Knowledge of General Education information will improve Description: Including pain rating scale, medication(s)/side effects and non-pharmacologic comfort measures Outcome: Progressing   Problem: Health Behavior/Discharge Planning: Goal: Ability to manage health-related needs will improve Outcome: Progressing   Problem: Clinical Measurements: Goal: Ability to maintain clinical measurements within normal limits will improve Outcome: Progressing Goal: Will remain free from infection Outcome: Progressing Goal: Diagnostic test results will improve Outcome: Progressing Goal: Respiratory complications will improve Outcome: Progressing Goal: Cardiovascular complication will be avoided Outcome: Progressing   Problem: Activity: Goal: Risk for activity intolerance will decrease Outcome: Progressing   Problem: Nutrition: Goal: Adequate nutrition will be maintained Outcome: Progressing   Problem: Coping: Goal: Level of anxiety will decrease Outcome: Progressing   Problem: Elimination: Goal: Will not experience complications related to bowel motility Outcome: Progressing Goal: Will not experience complications related to urinary retention Outcome: Progressing   Problem: Pain Managment: Goal: General experience of comfort will improve and/or be controlled Outcome: Progressing   Problem: Safety: Goal: Ability to remain free from injury will improve Outcome: Progressing   Problem: Skin Integrity: Goal: Risk for impaired skin integrity will decrease Outcome: Progressing   "

## 2024-11-29 NOTE — Progress Notes (Signed)
 ``[facility]  PROGRESS NOTE  Raymond Gordon FMW:[id number] DOB: Oct 24, 1951 DOA: 11/12/2024 PCP: [name], MD   Brief HPI:    74 y.o. male with medical history significant of DMII, Gout, afib, HTN, OSA, Gout, CKD IIIa, Anemia of chronic disease, Dementia, BPH chronic indwelling foley, HLD, Hfpef.    He was found by family, unable to get up from bathroom.  L leg weakness and L arm weakness noted.        Assessment/Plan:    Confusion/?  Sundowning -Mental status has been improving - workup has been negative - BP level 905, TSH 3.4 - MRI brain on 11/12/2024 was unremarkable - Checked thiamine  level 131, started on thiamine  100 mg daily -Started on trazodone  100 mg nightly, dose was changed to 25 mg due to somnolence .   -Seen by neurology, recommended to start CPAP at bedtime.  CPAP nightly ordered -EEG was negative for seizure or epileptiform discharges. -UA not concerning for UTI.  - Neurology signed off on 12/23  TIA Stroke Like Symptoms -Presented with episode of slurred speech and right facial droop - Seen by neurology, suspected left leg weakness is due to small stroke few months ago -MRI brain without any acute abnormality, chronic ischemic changes, remote lacunar infarcts -CTA head and neck stable from 11/25, bulky calcified plaque about L carotid bulb/proximal L ICA with severe 80% stenosis by NASCET criteria - occluded L vertebral artery continue with eliquis  per neurology PT/OT/SLP Needs outpatient neurology follow up  Acute kidney injury - Resolved  Skin rash - Noted hyperpigmented lesions noted in the skin around neck - Will start Eucerin cream twice a day for 4 days  Bradycardia - Heart rate has been in low 50s - Metoprolol  was held due to bradycardia    Left lower extremity weakness  neurology suspects the L leg weakness is related to a small stroke a few months ago  MRI of C/T/L spine without findings that look like they're related,  neurology notes MRI spine does not convincingly explain L leg weakness per neuro will need out patient EMG/NCS PT/OT   Degenerative Disc Disease Mild to moderate Diffuse Spinal Stenosis at C2-3 through C6-7 Disc Bulge with Facet Hypertrophy at L4-5 with L worse than R Lateral Recess Stenosis with moderate L worse than R L4 Foraminal Narrowing  Disc Protrusion at L3-4  Bilateral Facet Hypertrophy at L3-4 through L5-S1 -consider ortho/spine consult referral outpatient -pt/ot    Mild hyperkalemia Potassium is 5.4 -Will give 1 dose of Lokelma  -Follow-up BMP in a.m.   HFpEF Euvolemic on exam metoprolol /cozaar /lasix   - Lasix  on hold due to acute kidney injury as above -Follow BMP in am   DMII SSI lantus  18 units CBG well-controlled   Gout allopurinol     P Afib continue on eliquis  currently in sinus    HTN Losartan , metoprolol  - Metoprolol  discontinued due to bradycardia as above -Continue amlodipine  10 mg daily    OSA cpap at bedtime    CKDIIIa -at baseline      BPH  -continue finasteride     HLD -continue statin       DVT prophylaxis: Apixaban   Medications     allopurinol   300 mg Oral q AM   amLODipine   10 mg Oral Daily   apixaban   5 mg Oral BID   aspirin   81 mg Oral Daily   atorvastatin   80 mg Oral QHS   finasteride   5 mg Oral QPM   insulin  aspart  0-5 Units Subcutaneous  QHS   insulin  aspart  0-9 Units Subcutaneous TID WC   insulin  glargine-yfgn  18 Units Subcutaneous QHS   losartan   100 mg Oral Daily   pantoprazole   40 mg Oral Daily   thiamine   100 mg Oral Daily   traZODone   25 mg Oral QHS     Data Reviewed:   CBG:  Recent Labs  Lab 11/28/24 0603 11/28/24 1216 11/28/24 1711 11/28/24 2113 11/29/24 0627  GLUCAP 142* 120* 139* 156* 100*    SpO2: 99 % FiO2 (%): 21 %    Vitals:   11/29/24 0016 11/29/24 0030 11/29/24 0316 11/29/24 0828  BP: (!) 126/52  (!) 134/46 (!) 154/60  Pulse: (!) 52 (!) 54 (!) 54 (!) 52  Resp: 16 17 14 18    Temp: 98.1 F (36.7 C)  97.6 F (36.4 C) 98.2 F (36.8 C)  TempSrc: Oral  Oral   SpO2: 99% 99% 100% 99%  Weight:      Height:          Data Reviewed:  Basic Metabolic Panel: Recent Labs  Lab 11/26/24 1003 11/29/24 0137  NA 138 135  K 4.6 5.4*  CL 108 107  CO2 21* 18*  GLUCOSE 134* 113*  BUN 38* 45*  CREATININE 1.14 1.14  CALCIUM  9.5 9.2    CBC: Recent Labs  Lab 11/26/24 1003  WBC 4.9  HGB 12.5*  HCT 38.7*  MCV 89.2  PLT 300    LFT No results for input(s): AST, ALT, ALKPHOS, BILITOT, PROT, ALBUMIN in the last 168 hours.    Antibiotics: Anti-infectives (From admission, onward)    None        CONSULTS   Code Status: Full code  Family Communication:     Subjective   Complains of rash around the neck.  Also has been scratching the area around the neck.  Objective    Physical Examination:  General-appears in no acute distress Heart-S1-S2, regular, no murmur auscultated Lungs-clear to auscultation bilaterally, no wheezing or crackles auscultated Abdomen-soft, nontender, no organomegaly Extremities-no edema in the lower extremities Neuro-alert, oriented x3, no focal deficit noted Skin-hyperpigmented areas noted on the skin of neck          Ly Wass S Jaretzi Droz   [facility] If 7PM-7AM, please contact night-coverage at [email or web address], Office  [phone]   11/29/2024, 9:20 AM  LOS: 13 days

## 2024-11-30 ENCOUNTER — Ambulatory Visit: Admitting: Podiatry

## 2024-11-30 DIAGNOSIS — G459 Transient cerebral ischemic attack, unspecified: Secondary | ICD-10-CM | POA: Diagnosis not present

## 2024-11-30 DIAGNOSIS — I480 Paroxysmal atrial fibrillation: Secondary | ICD-10-CM | POA: Diagnosis not present

## 2024-11-30 DIAGNOSIS — R29898 Other symptoms and signs involving the musculoskeletal system: Secondary | ICD-10-CM | POA: Diagnosis not present

## 2024-11-30 LAB — BASIC METABOLIC PANEL WITH GFR
Anion gap: 9 (ref 5–15)
BUN: 39 mg/dL — ABNORMAL HIGH (ref 8–23)
CO2: 23 mmol/L (ref 22–32)
Calcium: 9.6 mg/dL (ref 8.9–10.3)
Chloride: 107 mmol/L (ref 98–111)
Creatinine, Ser: 0.99 mg/dL (ref 0.61–1.24)
GFR, Estimated: >60 mL/min
Glucose, Bld: 135 mg/dL — ABNORMAL HIGH (ref 70–99)
Potassium: 4.8 mmol/L (ref 3.5–5.1)
Sodium: 138 mmol/L (ref 135–145)

## 2024-11-30 LAB — GLUCOSE, CAPILLARY
Glucose-Capillary: 94 mg/dL (ref 70–99)
Glucose-Capillary: 101 mg/dL — ABNORMAL HIGH (ref 70–99)

## 2024-11-30 MED ORDER — AMLODIPINE BESYLATE 10 MG PO TABS: 10.0000 mg | ORAL_TABLET | Freq: Every day | ORAL | Status: AC

## 2024-11-30 MED ORDER — VITAMIN B-1 100 MG PO TABS: 100.0000 mg | ORAL_TABLET | Freq: Every day | ORAL | Status: AC

## 2024-11-30 MED ORDER — HYDROCERIN EX CREA: 1.0000 | TOPICAL_CREAM | Freq: Two times a day (BID) | CUTANEOUS | Status: AC

## 2024-11-30 MED ORDER — TRAZODONE HCL 50 MG PO TABS: 25.0000 mg | ORAL_TABLET | Freq: Every day | ORAL | Status: AC

## 2024-11-30 MED ORDER — LANTUS SOLOSTAR 100 UNIT/ML ~~LOC~~ SOPN: 18.0000 [IU] | PEN_INJECTOR | Freq: Every day | SUBCUTANEOUS | Status: AC

## 2024-11-30 MED ORDER — INSULIN ASPART 100 UNIT/ML IJ SOLN: 0.0000 [IU] | Freq: Three times a day (TID) | INTRAMUSCULAR | Status: AC

## 2024-11-30 NOTE — TOC Progression Note (Signed)
 Transition of Care [facility]) - Progression Note    Patient Details  Name: Raymond Gordon MRN: [id number] Date of Birth: May 30, 1951  Transition of [facility] CM/SW Contact  [name], [name] Phone Number: 11/30/2024, 9:43 AM  Clinical Narrative:   CSW contacted [facility] to ask about bed availability, awaiting response. CSW to follow.    Expected Discharge Plan: Skilled Nursing Facility Barriers to Discharge: No SNF bed               Expected Discharge Plan and Services                                               Social Drivers of Health (SDOH) Interventions SDOH Screenings   Food Insecurity: No Food Insecurity (11/13/2024)  Housing: Low Risk (11/13/2024)  Transportation Needs: Unmet Transportation Needs (11/13/2024)  Utilities: Not At Risk (11/13/2024)  Social Connections: Moderately Isolated (11/13/2024)  Tobacco Use: Medium Risk (11/13/2024)    Readmission Risk Interventions     No data to display

## 2024-11-30 NOTE — Progress Notes (Signed)
 Discharged to skilled nursing facility accompanied by transporter(PTAR).

## 2024-11-30 NOTE — Discharge Summary (Signed)
 " Physician Discharge Summary   Patient: Raymond Gordon MRN: [id number] DOB: 02/17/1951  Admit date:     11/12/2024  Discharge date: 11/30/2024  Discharge Physician: Sabas GORMAN Brod   PCP: [name], Richard W, MD   Recommendations at discharge:   Follow-up orthopedic surgery as outpatient for moderate diffuse spinal stenosis/degenerative disc disease Follow-up neurology as outpatient in 4 weeks  Discharge Diagnoses: Principal Problem:   TIA (transient ischemic attack)  Resolved Problems:   * No resolved hospital problems. *  Hospital Course: 74 y.o. male with medical history significant of DMII, Gout, afib, HTN, OSA, Gout, CKD IIIa, Anemia of chronic disease, Dementia, BPH chronic indwelling foley, HLD, Hfpef.    He was found by family, unable to get up from bathroom.  L leg weakness and L arm weakness noted.      Assessment and Plan:  Confusion/?  Sundowning -Improved - workup has been negative - BP level 905, TSH 3.4 - MRI brain on 11/12/2024 was unremarkable - Checked thiamine  level 131, started on thiamine  100 mg daily -Continue trazodone  25 mg p.o. nightly -Seen by neurology, recommended to start CPAP at bedtime.  CPAP nightly ordered -EEG was negative for seizure or epileptiform discharges. -UA not concerning for UTI.  - Neurology signed off on 12/23   TIA Stroke Like Symptoms -Presented with episode of slurred speech and right facial droop - Seen by neurology, suspected left leg weakness is due to small stroke few months ago -MRI brain without any acute abnormality, chronic ischemic changes, remote lacunar infarcts -CTA head and neck stable from 11/25, bulky calcified plaque about L carotid bulb/proximal L ICA with severe 80% stenosis by NASCET criteria - occluded L vertebral artery continue with eliquis  per neurology PT/OT/SLP Needs outpatient neurology follow up   Acute kidney injury - Resolved       Bradycardia - Heart rate has been in low 50s - Metoprolol   has been discontinued due to bradycardia     Left lower extremity weakness  neurology suspects the L leg weakness is related to a small stroke a few months ago  MRI of C/T/L spine without findings that look like they're related, neurology notes MRI spine does not convincingly explain L leg weakness per neuro will need out patient EMG/NCS -Ambulatory referral for neurology has been placed PT/OT   Degenerative Disc Disease Mild to moderate Diffuse Spinal Stenosis at C2-3 through C6-7 Disc Bulge with Facet Hypertrophy at L4-5 with L worse than R Lateral Recess Stenosis with moderate L worse than R L4 Foraminal Narrowing  Disc Protrusion at L3-4  Bilateral Facet Hypertrophy at L3-4 through L5-S1 -consider ortho/spine consult referral outpatient -Patient has seen Dr. Shari in the past, will follow-up with Dr.Caffrey as outpatient -pt/ot    Mild hyperkalemia Resolved   HFpEF Euvolemic on exam metoprolol /cozaar /lasix   - Lasix  has been discontinued   DMII SSI lantus  18 units CBG well-controlled -Continue sliding scale insulin  with NovoLog  along with Lantus    Gout allopurinol     P Afib continue on eliquis  currently in sinus    HTN Losartan , amlodipine  - Metoprolol  discontinued due to bradycardia as above -Continue amlodipine  10 mg daily    OSA cpap at bedtime    CKDIIIa -at baseline      BPH  -continue finasteride     HLD -continue statin           Consultants: Neurology Procedures performed:  Disposition: Home Diet recommendation:  Regular diet DISCHARGE MEDICATION: Allergies as of 11/30/2024   No  Known Allergies      Medication List     STOP taking these medications    aspirin  81 MG chewable tablet   furosemide  80 MG tablet Commonly known as: LASIX    metoprolol  tartrate 25 MG tablet Commonly known as: LOPRESSOR        TAKE these medications    acetaminophen  325 MG tablet Commonly known as: TYLENOL  Take 2 tablets (650 mg total) by  mouth every 6 (six) hours as needed for mild pain (pain score 1-3) or fever (or Fever >/= 101).   allopurinol  300 MG tablet Commonly known as: ZYLOPRIM  Take 300 mg by mouth in the morning.   amLODipine  10 MG tablet Commonly known as: NORVASC  Take 1 tablet (10 mg total) by mouth daily. Start taking on: December 01, 2024   atorvastatin  80 MG tablet Commonly known as: LIPITOR  Take 1 tablet by mouth once daily What changed: when to take this   Eliquis  5 MG Tabs tablet Generic drug: apixaban  Take 1 tablet by mouth twice daily What changed: how much to take   finasteride  5 MG tablet Commonly known as: PROSCAR  Take 5 mg by mouth every evening.   hydrocerin Crea Apply 1 Application topically 2 (two) times daily for 5 days. Apply to neck   insulin  aspart 100 UNIT/ML injection Commonly known as: novoLOG  Inject 0-9 Units into the skin 3 (three) times daily with meals. Sliding scale insulin  Less than 70 initiate hypoglycemia protocol 70-120  0 units 120-150 1 unit 151-200 2 units 201-250 3 units 251-300 5 units 301-350 7 units 351-400 9 units Greater than 400 call MD What changed:  how much to take additional instructions   Lantus  SoloStar 100 UNIT/ML Solostar Pen Generic drug: insulin  glargine Inject 18 Units into the skin at bedtime.   losartan  100 MG tablet Commonly known as: COZAAR  Take 100 mg by mouth daily.   OneTouch Delica Plus Lancet33G Misc Apply topically as directed.   OneTouch Verio test strip Generic drug: glucose blood 1 each by Other route as needed for other.   pantoprazole  40 MG tablet Commonly known as: PROTONIX  Take 1 tablet (40 mg total) by mouth daily.   PRESCRIPTION MEDICATION Inhale into the lungs at bedtime. Pt is on C Pap machine   thiamine  100 MG tablet Commonly known as: Vitamin B-1 Take 1 tablet (100 mg total) by mouth daily. Start taking on: December 01, 2024   traZODone  50 MG tablet Commonly known as: DESYREL  Take 0.5 tablets (25 mg  total) by mouth at bedtime.        Contact information for follow-up providers     Shari Sieving, MD. Schedule an appointment as soon as possible for a visit.   Specialty: Orthopedic Surgery Contact information: [address] [name] [address] [phone]              Contact information for after-discharge care     Destination     [facility] .   Service: Skilled Nursing Contact information: [address] [place]  [address] [phone]                    Discharge Exam: Raymond Gordon   11/15/24 1119  Weight: 103.9 kg   General-appears in no acute distress Heart-S1-S2, regular, no murmur auscultated Lungs-clear to auscultation bilaterally, no wheezing or crackles auscultated Abdomen-soft, nontender, no organomegaly Extremities-no edema in the lower extremities Neuro-alert, oriented x3, no focal deficit noted  Condition at discharge: good  The  results of significant diagnostics from this hospitalization (including imaging, microbiology, ancillary and laboratory) are listed below for reference.   Imaging Studies: EEG adult Result Date: 11/16/2024 Gordon Raymond KIDD, MD     11/16/2024  4:20 PM Patient Name: Raymond Gordon MRN: [id number] Epilepsy Attending: Arlin KIDD Gordon Referring Physician/Provider: Remi Pippin, NP Date: 11/16/2024 Duration: 23.07 mins Patient history: 74yo M with episode of slurred speech and right facial droop. EEG to evaluate for seizure Level of alertness: Awake AEDs during EEG study: None Technical aspects: This EEG study was done with scalp electrodes positioned according to the 10-20 International system of electrode placement. Electrical activity was reviewed with band pass filter of 1-70Hz , sensitivity of 7 uV/mm, display speed of 61mm/sec with a 60Hz  notched filter applied as appropriate. EEG data were recorded continuously and digitally stored.  Video monitoring was available and  reviewed as appropriate. Description: The posterior dominant rhythm consists of 8-9 Hz activity of moderate voltage (25-35 uV) seen predominantly in posterior head regions, symmetric and reactive to eye opening and eye closing. EEG showed intermittent generalized 3 to 6 Hz theta-delta slowing. Hyperventilation and photic stimulation were not performed.   ABNORMALITY - Intermittent slow, generalized IMPRESSION: This study is suggestive of generalized cerebral dysfunction ( encephalopathy). No seizures or epileptiform discharges were seen throughout the recording. Raymond Gordon   ECHOCARDIOGRAM COMPLETE Result Date: 11/14/2024    ECHOCARDIOGRAM REPORT   Patient Name:   Raymond Gordon Date of Exam: 11/14/2024 Medical Rec #:  [id number]     Height:       64.0 in Accession #:    [id number]    Weight:       229.0 lb Date of Birth:  05-Jul-1951      BSA:          2.072 m Patient Age:    73 years      BP:           140/78 mmHg Patient Gender: M             HR:           55 bpm. Exam Location:  Inpatient Procedure: 2D Echo, Cardiac Doppler, Color Doppler and Intracardiac            Opacification Agent (Both Spectral and Color Flow Doppler were            utilized during procedure). Indications:    TIA  History:        Patient has prior history of Echocardiogram examinations, most                 recent 03/09/2024. CHF, TIA, Arrythmias:Atrial Fibrillation,                 Signs/Symptoms:Edema; Risk Factors:Hypertension, Diabetes, Sleep                 Apnea, Former Smoker and Dyslipidemia.  Sonographer:    Raymond Gordon Referring Phys: [id number] SARA-MAIZ A THOMAS IMPRESSIONS  1. Left ventricular ejection fraction, by estimation, is 55 to 60%. The left ventricle has normal function. The left ventricle has no regional wall motion abnormalities. There is mild concentric left ventricular hypertrophy. Left ventricular diastolic parameters are consistent with Grade I diastolic dysfunction (impaired relaxation).  2. Right ventricular  systolic function is normal. The right ventricular size is normal. Tricuspid regurgitation signal is inadequate for assessing PA pressure.  3. The mitral valve is normal in structure. No evidence of mitral valve regurgitation. No  evidence of mitral stenosis.  4. The aortic valve is normal in structure. Aortic valve regurgitation is not visualized. No aortic stenosis is present.  5. The inferior vena cava is normal in size with greater than 50% respiratory variability, suggesting right atrial pressure of 3 mmHg. FINDINGS  Left Ventricle: Left ventricular ejection fraction, by estimation, is 55 to 60%. The left ventricle has normal function. The left ventricle has no regional wall motion abnormalities. Definity  contrast agent was given IV to delineate the left ventricular  endocardial borders. The left ventricular internal cavity size was normal in size. There is mild concentric left ventricular hypertrophy. Left ventricular diastolic parameters are consistent with Grade I diastolic dysfunction (impaired relaxation). Right Ventricle: The right ventricular size is normal. No increase in right ventricular wall thickness. Right ventricular systolic function is normal. Tricuspid regurgitation signal is inadequate for assessing PA pressure. Left Atrium: Left atrial size was normal in size. Right Atrium: Right atrial size was normal in size. Pericardium: There is no evidence of pericardial effusion. Presence of epicardial fat layer. Mitral Valve: The mitral valve is normal in structure. No evidence of mitral valve regurgitation. No evidence of mitral valve stenosis. Tricuspid Valve: The tricuspid valve is normal in structure. Tricuspid valve regurgitation is not demonstrated. No evidence of tricuspid stenosis. Aortic Valve: The aortic valve is normal in structure. Aortic valve regurgitation is not visualized. No aortic stenosis is present. Pulmonic Valve: The pulmonic valve was normal in structure. Pulmonic valve  regurgitation is not visualized. No evidence of pulmonic stenosis. Aorta: The aortic root is normal in size and structure. Venous: The inferior vena cava is normal in size with greater than 50% respiratory variability, suggesting right atrial pressure of 3 mmHg. IAS/Shunts: No atrial level shunt detected by color flow Doppler.  LEFT VENTRICLE PLAX 2D LVIDd:         4.50 cm   Diastology LVIDs:         3.20 cm   LV e' medial:    4.24 cm/s LV PW:         1.10 cm   LV E/e' medial:  12.7 LV IVS:        1.10 cm   LV e' lateral:   6.64 cm/s LVOT diam:     1.95 cm   LV E/e' lateral: 8.1 LV SV:         81 LV SV Index:   39 LVOT Area:     2.99 cm  RIGHT VENTRICLE             IVC RV Basal diam:  4.10 cm     IVC diam: 1.50 cm RV Mid diam:    3.30 cm RV S prime:     15.60 cm/s  PULMONARY VEINS TAPSE (M-mode): 3.1 cm      Diastolic Velocity: 28.90 cm/s                             S/D Velocity:       1.90                             Systolic Velocity:  55.20 cm/s LEFT ATRIUM             Index        RIGHT ATRIUM           Index LA diam:  4.50 cm 2.17 cm/m   RA Area:     17.80 cm LA Vol (A2C):   59.0 ml 28.47 ml/m  RA Volume:   46.60 ml  22.49 ml/m LA Vol (A4C):   63.0 ml 30.40 ml/m LA Biplane Vol: 61.0 ml 29.44 ml/m  AORTIC VALVE LVOT Vmax:   82.60 cm/s LVOT Vmean:  63.200 cm/s LVOT VTI:    0.271 m  AORTA Ao Root diam: 3.00 cm Ao Asc diam:  3.00 cm MITRAL VALVE MV Area (PHT): 3.08 cm    SHUNTS MV Decel Time: 246 msec    Systemic VTI:  0.27 m MV E velocity: 53.70 cm/s  Systemic Diam: 1.95 cm MV A velocity: 79.00 cm/s MV E/A ratio:  0.68 Raymond Tobb DO Electronically signed by Dub Huntsman DO Signature Date/Time: 11/14/2024/1:30:27 PM    Final    MR LUMBAR SPINE WO CONTRAST Result Date: 11/13/2024 CLINICAL DATA:  Initial evaluation for lower back pain, ataxia. EXAM: MRI LUMBAR SPINE WITHOUT CONTRAST TECHNIQUE: Multiplanar, multisequence MR imaging of the lumbar spine was performed. No intravenous contrast was  administered. COMPARISON:  None Available. FINDINGS: Segmentation: Standard. Lowest well-formed disc space labeled the L5-S1 level. Alignment: 3 mm facet mediated anterolisthesis of L4 on L5. Alignment otherwise normal with preservation of the normal lumbar lordosis. Vertebrae: Vertebral body height maintained without acute or chronic fracture. Bone marrow signal intensity within normal limits. No worrisome osseous lesions. Mild reactive edema of noted about the L4-5 and L5-S1 facets due to facet arthritis. No other abnormal marrow edema. Conus medullaris and cauda equina: Conus extends to the L1-2 level. Conus and cauda equina appear normal. Paraspinal and other soft tissues: Edema noted within the subcutaneous soft tissues of the lower back, nonspecific, but could be related to overall volume status. T2 hyperintense left renal cyst partially visualized, benign in appearance, no follow-up imaging recommended. Disc levels: L1-2:  Unremarkable. L2-3:  Unremarkable. L3-4: Disc desiccation with mild disc bulge. Superimposed small right foraminal to extraforaminal disc protrusion closely approximates the exiting right L3 nerve root. Mild to moderate bilateral facet hypertrophy with trace joint effusions. Mild narrowing of the lateral recesses bilaterally. Central canal remains patent. Mild bilateral L3 foraminal stenosis. L4-5: 3 mm anterolisthesis. Disc desiccation with diffuse disc bulge. Mild reactive endplate spurring. Moderate bilateral facet hypertrophy with associated small joint effusions. Resultant mild right with moderate left lateral recess stenosis. Central canal remains patent. Moderate left worse than right L4 foraminal narrowing. L5-S1: Normal interspace. Moderate right worse than left facet arthrosis with small joint effusions. No spinal stenosis. Mild bilateral L5 foraminal impaired IMPRESSION: 1. No acute abnormality within the lumbar spine. 2. Disc bulge with facet hypertrophy at L4-5 with resultant  moderate left worse than right lateral recess stenosis, with moderate left worse than right L4 foraminal narrowing. 3. Small right foraminal to extraforaminal disc protrusion at L3-4, potentially affecting the exiting right L3 nerve root. 4. Moderate bilateral facet hypertrophy at L3-4 through L5-S1, which could contribute to lower back pain. Electronically Signed   By: Morene Hoard M.D.   On: 11/13/2024 04:02   MR THORACIC SPINE WO CONTRAST Result Date: 11/13/2024 CLINICAL DATA:  Initial evaluation for ataxia, nontraumatic. EXAM: MRI THORACIC SPINE WITHOUT CONTRAST TECHNIQUE: Multiplanar, multisequence MR imaging of the thoracic spine was performed. No intravenous contrast was administered. COMPARISON:  None Available. FINDINGS: Alignment: Mild dextroscoliosis. Alignment otherwise normal preservation of the normal thoracic kyphosis. No listhesis. Vertebrae: Vertebral body height maintained without acute or chronic fracture. Bone marrow signal  intensity within normal limits. No discrete or worrisome osseous lesions. No abnormal marrow edema. Cord:  Normal signal and morphology. Paraspinal and other soft tissues: Mild edema within the subcutaneous soft tissues of the partially visualized upper back, could be related to overall volume status. Paraspinous soft tissues demonstrate no other acute finding. Disc levels: No significant disc pathology seen within the thoracic spine for age. No significant disc bulge or focal disc herniation. No significant facet disease. No canal or neural foraminal stenosis or evidence for neural impingement. IMPRESSION: Normal MRI of the thoracic spine and spinal cord. No significant disc pathology, stenosis, or evidence for neural impingement. Electronically Signed   By: Morene Hoard M.D.   On: 11/13/2024 03:55   MR CERVICAL SPINE WO CONTRAST Result Date: 11/13/2024 CLINICAL DATA:  Initial evaluation for chronic myelopathy. EXAM: MRI CERVICAL SPINE WITHOUT CONTRAST  TECHNIQUE: Multiplanar, multisequence MR imaging of the cervical spine was performed. No intravenous contrast was administered. COMPARISON:  None Available. FINDINGS: Alignment: Examination degraded by motion artifact. Straightening of the normal cervical lordosis.  No listhesis. Vertebrae: Vertebral body height maintained without acute or chronic fracture. Bone marrow signal intensity within normal limits. No discrete or worrisome osseous lesions or abnormal marrow edema. Cord: Normal signal and morphology. Posterior Fossa, vertebral arteries, paraspinal tissues: Craniocervical junction within normal limits. Paraspinous soft tissues demonstrate no acute finding. Loss of normal flow void within the left vertebral artery, consistent with occlusion as seen on prior CTA. Disc levels: C2-C3: Central disc protrusion indents the ventral thecal sac. Moderate right with mild left facet arthrosis. Mild spinal stenosis. Moderate left C3 foraminal narrowing. Right neural foramen remains patent. C3-C4: Degenerate intervertebral disc space narrowing with diffuse disc bulge and uncovertebral spurring. Mild facet and ligament flavum hypertrophy. Resultant moderate spinal stenosis. Moderate to severe left with moderate right C4 foraminal narrowing. C4-C5: Degenerative intervertebral disc space narrowing with diffuse disc bulge and bilateral uncovertebral spurring. Flattening and partial effacement of the ventral thecal sac. Superimposed facet and ligament flavum hypertrophy. Resultant moderate spinal stenosis with mild cord flattening but no visible cord signal changes. Moderate to severe right worse than left C5 foraminal stenosis. C5-C6: Degenerative disc space narrowing. Small central disc protrusion mildly indents the ventral thecal sac. Right greater than left uncovertebral spurring. Mild spinal stenosis. Mild to moderate left C6 foraminal narrowing. Right neural foramen remains patent. C6-C7: Disc bulge with uncovertebral  spurring. Superimposed small central disc protrusion with slight inferior migration, asymmetric to the right. Mild right-sided facet hypertrophy. No more than mild spinal stenosis. Mild to moderate right C7 foraminal narrowing. Left neural foramen appears grossly patent. C7-T1: Negative interspace. No spinal stenosis. Foramina remain adequately patent. IMPRESSION: 1. Motion degraded exam. 2. Normal MRI appearance of the cervical spinal cord. No cord signal changes to suggest myelopathy. 3. Multilevel cervical spondylosis with resultant mild to moderate diffuse spinal stenosis at C2-3 through C6-7, most pronounced at C3-4 and C4-5. 4. Multifactorial degenerative changes with resultant multilevel foraminal narrowing as above. Notable findings include moderate left C3 foraminal stenosis, moderate to severe left with moderate right C4 foraminal narrowing, with moderate to severe right worse than left C5 foraminal stenosis. 5. Loss of normal flow void within the left vertebral artery, consistent with occlusion as seen on prior CTA. Electronically Signed   By: Morene Hoard M.D.   On: 11/13/2024 03:47   CT ANGIO HEAD NECK W WO CM Result Date: 11/12/2024 CLINICAL DATA:  Initial evaluation for acute neuro deficit, stroke suspected. EXAM: CT ANGIOGRAPHY  HEAD AND NECK WITH AND WITHOUT CONTRAST TECHNIQUE: Multidetector CT imaging of the head and neck was performed using the standard protocol during bolus administration of intravenous contrast. Multiplanar CT image reconstructions and MIPs were obtained to evaluate the vascular anatomy. Carotid stenosis measurements (when applicable) are obtained utilizing NASCET criteria, using the distal internal carotid diameter as the denominator. RADIATION DOSE REDUCTION: This exam was performed according to the departmental dose-optimization program which includes automated exposure control, adjustment of the mA and/or kV according to patient size and/or use of iterative  reconstruction technique. CONTRAST:  75mL OMNIPAQUE  IOHEXOL  350 MG/ML SOLN COMPARISON:  Prior MRI from earlier the same day and prior CT from 10/18/2024. FINDINGS: CTA NECK FINDINGS Aortic arch: Visualized aortic arch within normal limits for caliber with standard 3 vessel morphology. Mild aortic atherosclerosis. No significant stenosis about the origin the great vessels. Right carotid system: Right common and internal carotid arteries are patent without dissection. Atheromatous change about the right carotid bulb without hemodynamically significant greater than 50% stenosis. Left carotid system: Left common and internal carotid arteries are patent without dissection. Bulky calcified plaque about the left carotid bulb/proximal left ICA with severe 80% stenosis by NASCET criteria. 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 Atheromatous change at the origin of the right vertebral artery but without hemodynamically significant stenosis. Right vertebral artery patent distally without stenosis or dissection. Skeleton: No worrisome osseous lesions.  Patient is edentulous. Other neck: No other acute finding. Upper chest: No other acute finding. Review of the MIP images confirms the above findings CTA HEAD FINDINGS Anterior circulation: Atheromatous change about the carotid siphons but without hemodynamically significant stenosis. A1 segments patent bilaterally. Normal anterior communicating artery complex. Anterior cerebral arteries patent without stenosis. No M1 stenosis or occlusion. Left M1 bifurcates early. No proximal MCA branch occlusion or high-grade stenosis. Distal MCA branches perfused and symmetric. Posterior circulation: Left vertebral artery remains occluded to the vertebrobasilar junction. Atheromatous change at the proximal right V4 segment without significant stenosis. Neither PICA visualized. Basilar diminutive but patent without stenosis.  Superior cerebral arteries patent bilaterally. Fetal type origin of the PCAs bilaterally both of which remain widely patent to their distal aspects. Venous sinuses: Not well assessed due to timing the contrast bolus. Anatomic variants: Fetal type origin of the PCAs with overall diminutive vertebrobasilar system. No aneurysm. Review of the MIP images confirms the above findings IMPRESSION: 1. Stable CTA as compared to 10/18/2024. No acute large vessel occlusion or other emergent finding. 2. Bulky calcified plaque about the left carotid bulb/proximal left ICA with severe 80% stenosis by NASCET criteria. 3. Occluded left vertebral artery. 4.  Aortic Atherosclerosis (ICD10-I70.0). Electronically Signed   By: Morene Hoard M.D.   On: 11/12/2024 22:11   DG Pelvis Portable Result Date: 11/12/2024 CLINICAL DATA:  Recent fall with pelvic pain, initial encounter EXAM: PORTABLE PELVIS 1-2 VIEWS COMPARISON:  None Available. FINDINGS: Pelvic ring is intact. Degenerative changes of the hip joints are noted left greater than right. Subchondral cyst formation is noted in the left femoral head. No soft tissue abnormality is seen. IMPRESSION: Degenerative changes without acute abnormality. Electronically Signed   By: Oneil Devonshire M.D.   On: 11/12/2024 20:46   DG Chest Port 1 View Result Date: 11/12/2024 CLINICAL DATA:  Weakness EXAM: PORTABLE CHEST 1 VIEW COMPARISON:  05/31/2024 FINDINGS: The heart size and mediastinal contours are within normal limits. Both lungs are clear. The visualized skeletal structures are unremarkable. IMPRESSION:  No active disease. Electronically Signed   By: Oneil Devonshire M.D.   On: 11/12/2024 20:46   MR BRAIN WO CONTRAST Result Date: 11/12/2024 EXAM: MRI BRAIN WITHOUT CONTRAST 11/12/2024 07:39:00 PM TECHNIQUE: Multiplanar multisequence MRI of the head/brain was performed without the administration of intravenous contrast. COMPARISON: MR head without contrast 10/18/2024. CLINICAL HISTORY:  Neuro deficit, acute, stroke suspected. Slurred speech and right-sided facial droop. FINDINGS: BRAIN AND VENTRICLES: No acute infarct. No intracranial hemorrhage. No mass. No midline shift. No hydrocephalus. Confluent periventricular and scattered subcortical T2 hyperintensities are moderately advanced for age, stable from the prior study. Were noted ischemic changes are again noted in the thalami bilaterally. Remote lacunar infarcts are present in the right superior cerebellum. The sella is unremarkable. Normal flow voids. ORBITS: Bilateral lens replacements are noted. The globes and orbits are otherwise within normal limits. SINUSES AND MASTOIDS: No acute abnormality. BONES AND SOFT TISSUES: Normal marrow signal. No acute soft tissue abnormality. IMPRESSION: 1. No acute intracranial abnormality. 2. Moderately advanced confluent periventricular and scattered subcortical T2 hyperintensities, stable from the prior study. This most likely reflects the sequelae of chronic microvascular ischemia. 3. Chronic ischemic changes in the thalami bilaterally. 4. Remote lacunar infarcts in the right superior cerebellum. Electronically signed by: Lonni Necessary MD 11/12/2024 07:46 PM EST RP Workstation: [id number]    Microbiology: Results for orders placed or performed during the hospital encounter of 10/18/24  Urine Culture     Status: Abnormal   Collection Time: 10/18/24 10:53 AM   Specimen: Urine, Clean Catch  Result Value Ref Range Status   Specimen Description URINE, CLEAN CATCH  Final   Special Requests   Final    NONE Performed at [facility], [address], [place], [name] [address]    Culture 30,000 COLONIES/mL ENTEROCOCCUS FAECIUM (A)  Final   Report Status 10/20/2024 FINAL  Final   Organism ID, Bacteria ENTEROCOCCUS FAECIUM (A)  Final      Susceptibility   Enterococcus faecium - MIC*    AMPICILLIN >=32 RESISTANT Resistant     NITROFURANTOIN 64 INTERMEDIATE Intermediate     VANCOMYCIN  <=0.5 SENSITIVE Sensitive     * 30,000 COLONIES/mL ENTEROCOCCUS FAECIUM    Labs: CBC: Recent Labs  Lab 11/26/24 1003  WBC 4.9  HGB 12.5*  HCT 38.7*  MCV 89.2  PLT 300   Basic Metabolic Panel: Recent Labs  Lab 11/26/24 1003 11/29/24 0137 11/30/24 0152  NA 138 135 138  K 4.6 5.4* 4.8  CL 108 107 107  CO2 21* 18* 23  GLUCOSE 134* 113* 135*  BUN 38* 45* 39*  CREATININE 1.14 1.14 0.99  CALCIUM  9.5 9.2 9.6   Liver Function Tests: No results for input(s): AST, ALT, ALKPHOS, BILITOT, PROT, ALBUMIN in the last 168 hours. CBG: Recent Labs  Lab 11/29/24 1132 11/29/24 1703 11/29/24 2110 11/30/24 0625 11/30/24 1114  GLUCAP 96 112* 159* 94 101*    Discharge time spent: greater than 30 minutes.  Signed: Sabas GORMAN Brod, MD [facility] 11/30/2024 "

## 2024-11-30 NOTE — TOC Transition Note (Signed)
 Transition of Care [facility]) - Discharge Note   Patient Details  Name: Raymond Gordon MRN: [id number] Date of Birth: 1951-09-30  Transition of [facility]) CM/SW Contact:  [name], LCSW Phone Number: 11/30/2024, 2:12 PM   Clinical Narrative:   CSW confirmed with MD that patient is stable for discharge, and [facility] has a bed available. CSW updated sister, [name], she is in agreement. Transport arranged with [facility] for next available.  Nurse to call report to [phone], Room 103.    Final next level of care: Skilled Nursing Facility Barriers to Discharge: Barriers Resolved   Patient Goals and CMS Choice            Discharge Placement              Patient chooses bed at: [facility] Patient to be transferred to facility by: [facility] Name of family member notified: [name] Patient and family notified of of transfer: 11/30/24  Discharge Plan and Services Additional resources added to the After Visit Summary for                                       Social Drivers of Health (SDOH) Interventions SDOH Screenings   Food Insecurity: No Food Insecurity (11/13/2024)  Housing: Low Risk (11/13/2024)  Transportation Needs: Unmet Transportation Needs (11/13/2024)  Utilities: Not At Risk (11/13/2024)  Social Connections: Moderately Isolated (11/13/2024)  Tobacco Use: Medium Risk (11/13/2024)     Readmission Risk Interventions     No data to display

## 2024-11-30 NOTE — Plan of Care (Signed)
   Problem: Coping: Goal: Will verbalize positive feelings about self Outcome: Progressing

## 2024-11-30 NOTE — Progress Notes (Signed)
 Occupational Therapy Treatment Patient Details Name: Raymond Gordon MRN: [id number] DOB: November 27, 1950 Today's Date: 11/30/2024   History of present illness Pt is a 74 y.o. M who presented to [facility] ED 11/12/24 with episode of slurred speech and RFD. MRI brain negative for acute findings. PMHx: DMII, HLD, gout, afib, HTN, CHF, OSA, CKDIII, anemia, dementia, BPH with chronic indwelling foley catheter.   OT comments  Pt progressing toward established OT goals. Challenging strength, balance, and return to functional ADL this session with LB ADL and re-introduction of AE into pt's dressing routine. Pt familiar with task and with good demo. Pt needing CGA for LB ADL this session. Reviewed fall prevention strategies and pt receptive. Patient will benefit from continued inpatient follow up therapy, <3 hours/day.        If plan is discharge home, recommend the following:  Assistance with cooking/housework;Direct supervision/assist for medications management;Direct supervision/assist for financial management;Assist for transportation;Help with stairs or ramp for entrance;A little help with walking and/or transfers;A lot of help with bathing/dressing/bathroom   Equipment Recommendations  Other (comment) (defer)    Recommendations for Other Services      Precautions / Restrictions Precautions Precautions: Fall Recall of Precautions/Restrictions: Intact Precaution/Restrictions Comments: fearful of falling Restrictions Weight Bearing Restrictions Per Provider Order: No       Mobility Bed Mobility Overal bed mobility: Needs Assistance Bed Mobility: Supine to Sit     Supine to sit: Contact guard, HOB elevated, Used rails     General bed mobility comments: increased time and effort.    Transfers Overall transfer level: Needs assistance Equipment used: Rolling walker (2 wheels) Transfers: Sit to/from Stand Sit to Stand: Contact guard assist           General transfer comment: increased time  for rise, cues for hand placement with STS     Balance Overall balance assessment: Needs assistance Sitting-balance support: Feet supported, Bilateral upper extremity supported Sitting balance-Leahy Scale: Good     Standing balance support: Bilateral upper extremity supported, During functional activity, Reliant on assistive device for balance Standing balance-Leahy Scale: Poor Standing balance comment: Pt dependent on RW                           ADL either performed or assessed with clinical judgement   ADL Overall ADL's : Needs assistance/impaired                     Lower Body Dressing: Contact guard assist;Sitting/lateral leans;Sit to/from stand;With adaptive equipment Lower Body Dressing Details (indicate cue type and reason): CGA, touching with VCs, able to don pants/underpants with reacher after intro of reacher and pt familiar with AE from prior rehab stays. Toilet Transfer: Contact guard assist;Rolling walker (2 wheels)                  Extremity/Trunk Assessment              Vision       Perception     Praxis     Communication Communication Communication: No apparent difficulties   Cognition Arousal: Alert Behavior During Therapy: WFL for tasks assessed/performed Cognition: No apparent impairments             OT - Cognition Comments: oriented x4; hx dementia                 Following commands: Intact        Cueing   Cueing Techniques:  Verbal cues, Gestural cues  Exercises      Shoulder Instructions       General Comments      Pertinent Vitals/ Pain       Pain Assessment Pain Assessment: No/denies pain  Home Living                                          Prior Functioning/Environment              Frequency  Min 2X/week        Progress Toward Goals  OT Goals(current goals can now be found in the care plan section)  Progress towards OT goals: Progressing toward  goals     Plan      Co-evaluation                 AM-PAC OT 6 Clicks Daily Activity     Outcome Measure   Help from another person eating meals?: None Help from another person taking care of personal grooming?: A Little Help from another person toileting, which includes using toliet, bedpan, or urinal?: A Little Help from another person bathing (including washing, rinsing, drying)?: A Little Help from another person to put on and taking off regular upper body clothing?: A Little Help from another person to put on and taking off regular lower body clothing?: A Little 6 Click Score: 19    End of Session Equipment Utilized During Treatment: Gait belt;Rolling walker (2 wheels)  OT Visit Diagnosis: Unsteadiness on feet (R26.81);History of falling (Z91.81);Muscle weakness (generalized) (M62.81)   Activity Tolerance Patient tolerated treatment well   Patient Left Other (comment);with bed alarm set (EOB with RN present)   Nurse Communication Mobility status        Time: 8567-8498 OT Time Calculation (min): 29 min  Charges: OT General Charges $OT Visit: 1 Visit OT Treatments $Self Care/Home Management : 23-37 mins  Elma JONETTA Lebron FREDERICK, OTR/L [facility] Acute Rehabilitation Office: [phone]   Elma JONETTA Lebron 11/30/2024, 3:11 PM

## 2024-12-26 ENCOUNTER — Emergency Department (HOSPITAL_COMMUNITY)

## 2024-12-26 ENCOUNTER — Inpatient Hospital Stay (HOSPITAL_COMMUNITY)
Admission: EM | Admit: 2024-12-26 | Source: Home / Self Care | Attending: Internal Medicine | Admitting: Internal Medicine

## 2024-12-26 ENCOUNTER — Encounter (HOSPITAL_COMMUNITY): Payer: Self-pay | Admitting: Emergency Medicine

## 2024-12-26 DIAGNOSIS — I5032 Chronic diastolic (congestive) heart failure: Secondary | ICD-10-CM | POA: Diagnosis present

## 2024-12-26 DIAGNOSIS — I5023 Acute on chronic systolic (congestive) heart failure: Secondary | ICD-10-CM

## 2024-12-26 DIAGNOSIS — I10 Essential (primary) hypertension: Secondary | ICD-10-CM | POA: Diagnosis present

## 2024-12-26 DIAGNOSIS — E66813 Obesity, class 3: Secondary | ICD-10-CM | POA: Diagnosis present

## 2024-12-26 DIAGNOSIS — G4733 Obstructive sleep apnea (adult) (pediatric): Secondary | ICD-10-CM | POA: Diagnosis present

## 2024-12-26 DIAGNOSIS — A419 Sepsis, unspecified organism: Principal | ICD-10-CM

## 2024-12-26 DIAGNOSIS — E1169 Type 2 diabetes mellitus with other specified complication: Secondary | ICD-10-CM | POA: Diagnosis present

## 2024-12-26 DIAGNOSIS — T68XXXA Hypothermia, initial encounter: Secondary | ICD-10-CM | POA: Diagnosis present

## 2024-12-26 DIAGNOSIS — R509 Fever, unspecified: Secondary | ICD-10-CM | POA: Insufficient documentation

## 2024-12-26 DIAGNOSIS — R4182 Altered mental status, unspecified: Secondary | ICD-10-CM

## 2024-12-26 DIAGNOSIS — I480 Paroxysmal atrial fibrillation: Secondary | ICD-10-CM | POA: Diagnosis present

## 2024-12-26 DIAGNOSIS — R748 Abnormal levels of other serum enzymes: Secondary | ICD-10-CM

## 2024-12-26 DIAGNOSIS — E870 Hyperosmolality and hypernatremia: Secondary | ICD-10-CM | POA: Diagnosis present

## 2024-12-26 LAB — COMPREHENSIVE METABOLIC PANEL WITH GFR
ALT: 247 U/L — ABNORMAL HIGH (ref 0–44)
AST: 96 U/L — ABNORMAL HIGH (ref 15–41)
Albumin: 3.5 g/dL (ref 3.5–5.0)
Alkaline Phosphatase: 216 U/L — ABNORMAL HIGH (ref 38–126)
Anion gap: 14 (ref 5–15)
BUN: 23 mg/dL (ref 8–23)
CO2: 19 mmol/L — ABNORMAL LOW (ref 22–32)
Calcium: 10.3 mg/dL (ref 8.9–10.3)
Chloride: 117 mmol/L — ABNORMAL HIGH (ref 98–111)
Creatinine, Ser: 1.04 mg/dL (ref 0.61–1.24)
GFR, Estimated: >60 mL/min
Glucose, Bld: 53 mg/dL — ABNORMAL LOW (ref 70–99)
Potassium: 4.8 mmol/L (ref 3.5–5.1)
Sodium: 150 mmol/L — ABNORMAL HIGH (ref 135–145)
Total Bilirubin: 0.3 mg/dL (ref 0.0–1.2)
Total Protein: 8 g/dL (ref 6.5–8.1)

## 2024-12-26 LAB — URINALYSIS, ROUTINE W REFLEX MICROSCOPIC
Bacteria, UA: NONE SEEN
Bilirubin Urine: NEGATIVE
Glucose, UA: NEGATIVE mg/dL
Hgb urine dipstick: NEGATIVE
Ketones, ur: NEGATIVE mg/dL
Nitrite: NEGATIVE
Protein, ur: NEGATIVE mg/dL
Specific Gravity, Urine: 1.006 (ref 1.005–1.030)
pH: 5 (ref 5.0–8.0)

## 2024-12-26 LAB — I-STAT CG4 LACTIC ACID, ED
Lactic Acid, Venous: 0.4 mmol/L — ABNORMAL LOW (ref 0.5–1.9)
Lactic Acid, Venous: 0.4 mmol/L — ABNORMAL LOW (ref 0.5–1.9)

## 2024-12-26 LAB — I-STAT VENOUS BLOOD GAS, ED
Acid-base deficit: 1 mmol/L (ref 0.0–2.0)
Bicarbonate: 23.8 mmol/L (ref 20.0–28.0)
Calcium, Ion: 1.31 mmol/L (ref 1.15–1.40)
HCT: 35 % — ABNORMAL LOW (ref 39.0–52.0)
Hemoglobin: 11.9 g/dL — ABNORMAL LOW (ref 13.0–17.0)
O2 Saturation: 96 %
Potassium: 4.4 mmol/L (ref 3.5–5.1)
Sodium: 151 mmol/L — ABNORMAL HIGH (ref 135–145)
TCO2: 25 mmol/L (ref 22–32)
pCO2, Ven: 37.8 mmHg — ABNORMAL LOW (ref 44–60)
pH, Ven: 7.407 (ref 7.25–7.43)
pO2, Ven: 78 mmHg — ABNORMAL HIGH (ref 32–45)

## 2024-12-26 LAB — RESP PANEL BY RT-PCR (RSV, FLU A&B, COVID)  RVPGX2
Influenza A by PCR: NEGATIVE
Influenza B by PCR: NEGATIVE
Resp Syncytial Virus by PCR: NEGATIVE
SARS Coronavirus 2 by RT PCR: NEGATIVE

## 2024-12-26 LAB — CBC WITH DIFFERENTIAL/PLATELET
Basophils Absolute: 0 10*3/uL (ref 0.0–0.1)
Basophils Relative: 0 %
Eosinophils Absolute: 0 10*3/uL (ref 0.0–0.5)
Eosinophils Relative: 0 %
HCT: 36.9 % — ABNORMAL LOW (ref 39.0–52.0)
Hemoglobin: 11.4 g/dL — ABNORMAL LOW (ref 13.0–17.0)
Lymphocytes Relative: 16 %
Lymphs Abs: 0.9 10*3/uL (ref 0.7–4.0)
MCH: 28.4 pg (ref 26.0–34.0)
MCHC: 30.9 g/dL (ref 30.0–36.0)
MCV: 91.8 fL (ref 80.0–100.0)
Monocytes Absolute: 0.3 10*3/uL (ref 0.1–1.0)
Monocytes Relative: 5 %
Neutro Abs: 4.7 10*3/uL (ref 1.7–7.7)
Neutrophils Relative %: 79 %
Platelets: 165 10*3/uL (ref 150–400)
RBC: 4.02 MIL/uL — ABNORMAL LOW (ref 4.22–5.81)
RDW: 17.1 % — ABNORMAL HIGH (ref 11.5–15.5)
WBC: 5.9 10*3/uL (ref 4.0–10.5)
nRBC: 0.3 % — ABNORMAL HIGH (ref 0.0–0.2)

## 2024-12-26 LAB — TSH: TSH: 9.12 u[IU]/mL — ABNORMAL HIGH (ref 0.350–4.500)

## 2024-12-26 LAB — PRO BRAIN NATRIURETIC PEPTIDE: Pro Brain Natriuretic Peptide: 243 pg/mL

## 2024-12-26 LAB — CBG MONITORING, ED: Glucose-Capillary: 139 mg/dL — ABNORMAL HIGH (ref 70–99)

## 2024-12-26 LAB — TROPONIN T, HIGH SENSITIVITY: Troponin T High Sensitivity: 14 ng/L (ref 0–19)

## 2024-12-26 MED ORDER — ALBUTEROL SULFATE (2.5 MG/3ML) 0.083% IN NEBU
INHALATION_SOLUTION | RESPIRATORY_TRACT | Status: AC
  Filled 2024-12-26: qty 12

## 2024-12-26 MED ORDER — LACTATED RINGERS IV BOLUS
1000.0000 mL | Freq: Once | INTRAVENOUS | Status: AC
  Administered 2024-12-26: 1000 mL via INTRAVENOUS

## 2024-12-26 MED ORDER — VANCOMYCIN HCL 2000 MG/400ML IV SOLN
2000.0000 mg | Freq: Once | INTRAVENOUS | Status: AC
  Administered 2024-12-27: 2000 mg via INTRAVENOUS
  Filled 2024-12-26: qty 400

## 2024-12-26 MED ORDER — DEXTROSE 50 % IV SOLN
25.0000 g | Freq: Once | INTRAVENOUS | Status: AC
  Administered 2024-12-26: 25 g via INTRAVENOUS
  Filled 2024-12-26: qty 50

## 2024-12-26 MED ORDER — PIPERACILLIN-TAZOBACTAM 3.375 G IVPB 30 MIN
3.3750 g | Freq: Once | INTRAVENOUS | Status: AC
  Administered 2024-12-26: 3.375 g via INTRAVENOUS
  Filled 2024-12-26: qty 50

## 2024-12-26 MED ORDER — IOHEXOL 350 MG/ML SOLN
75.0000 mL | Freq: Once | INTRAVENOUS | Status: AC | PRN
  Administered 2024-12-26: 75 mL via INTRAVENOUS

## 2024-12-26 NOTE — ED Triage Notes (Signed)
 Pt here from [facility] with c/o sob  and aloc , pt room air sats  88 % placed on 3 liters ,pt placed on 4 liters by ems

## 2024-12-26 NOTE — ED Notes (Signed)
 Patient transported to CT

## 2024-12-26 NOTE — ED Provider Notes (Signed)
 " [facility] EMERGENCY DEPARTMENT AT [facility] Provider Note   CSN: [id number] Arrival date & time: 12/26/24  2049     Patient presents with: No chief complaint on file.   Raymond Gordon is a 74 y.o. male with a past medical history notable for HFpEF, paroxysmal A-fib, diabetes, CKD, dementia who presents today for evaluation of altered mental status as well as hypoxia.  Limited initial information.  Patient is a confused on my assessment and unable to contribute to history.  Per EMS they were called out to [facility] with concerns for shortness of breath as well as altered mental status.  Unclear last known normal.  Reportedly patient at baseline is able to converse and is oriented x 3.  Patient was found to be hypoxic at 88% on room air and placed on 3 L with improvement in saturation.  BGL within normal limits.      Prior to Admission medications  Medication Sig Start Date End Date Taking? Authorizing Provider  acetaminophen  (TYLENOL ) 325 MG tablet Take 2 tablets (650 mg total) by mouth every 6 (six) hours as needed for mild pain (pain score 1-3) or fever (or Fever >/= 101). 06/07/24   Elgergawy, Brayton RAMAN, MD  allopurinol  (ZYLOPRIM ) 300 MG tablet Take 300 mg by mouth in the morning. 05/02/15   [provider]  amLODipine  (NORVASC ) 10 MG tablet Take 1 tablet (10 mg total) by mouth daily. 12/01/24   Drusilla Sabas RAMAN, MD  apixaban  (ELIQUIS ) 5 MG TABS tablet Take 1 tablet by mouth twice daily Patient taking differently: Take 5 mg by mouth 2 (two) times daily. 09/19/23   Court Dorn PARAS, MD  atorvastatin  (LIPITOR ) 80 MG tablet Take 1 tablet by mouth once daily Patient taking differently: Take 80 mg by mouth at bedtime. 06/18/23   Court Dorn PARAS, MD  finasteride  (PROSCAR ) 5 MG tablet Take 5 mg by mouth every evening. 04/11/22   [provider]  insulin  aspart (NOVOLOG ) 100 UNIT/ML injection Inject 0-9 Units into the skin 3 (three) times daily with meals. Sliding scale  insulin  Less than 70 initiate hypoglycemia protocol 70-120  0 units 120-150 1 unit 151-200 2 units 201-250 3 units 251-300 5 units 301-350 7 units 351-400 9 units Greater than 400 call MD 11/30/24   Drusilla Sabas RAMAN, MD  Lancets [facility] DELICA PLUS [place]) MISC Apply topically as directed. 06/29/24   [provider]  LANTUS  SOLOSTAR 100 UNIT/ML Solostar Pen Inject 18 Units into the skin at bedtime. 11/30/24   Drusilla Sabas RAMAN, MD  losartan  (COZAAR ) 100 MG tablet Take 100 mg by mouth daily. 09/13/24   [provider]  AISHA SINKS test strip 1 each by Other route as needed for other.    [provider]  pantoprazole  (PROTONIX ) 40 MG tablet Take 1 tablet (40 mg total) by mouth daily. 06/10/24   Ghimire, Donalda HERO, MD  PRESCRIPTION MEDICATION Inhale into the lungs at bedtime. Pt is on C Pap machine    [provider]  thiamine  (VITAMIN B-1) 100 MG tablet Take 1 tablet (100 mg total) by mouth daily. 12/01/24   Drusilla Sabas RAMAN, MD  traZODone  (DESYREL ) 50 MG tablet Take 0.5 tablets (25 mg total) by mouth at bedtime. 11/30/24   Drusilla Sabas RAMAN, MD    Allergies: Patient has no known allergies.    Review of Systems  Updated Vital Signs There were no vitals taken for this visit.  Physical Exam Constitutional:  Comments: Somnolent  HENT:     Head: Normocephalic and atraumatic.     Mouth/Throat:     Mouth: Mucous membranes are moist.  Eyes:     Pupils: Pupils are equal, round, and reactive to light.  Cardiovascular:     Rate and Rhythm: Normal rate and regular rhythm.  Pulmonary:     Effort: Pulmonary effort is normal.     Breath sounds: Rhonchi present. No decreased breath sounds or wheezing.  Abdominal:     Palpations: Abdomen is soft.     Tenderness: There is no guarding or rebound.  Musculoskeletal:        General: Normal range of motion.     Cervical back: Normal range of motion.  Skin:    General: Skin is warm.     Capillary Refill: Capillary refill takes  less than 2 seconds.     Findings: No rash.  Neurological:     Comments: Somnolent however will wake to respond to basic verbal commands.  Grossly symmetric grip strength and plantar, dorsi flexion though with generalized weakness     (all labs ordered are listed, but only abnormal results are displayed) Labs Reviewed - No data to display  EKG: None  Radiology: No results found.  Procedures   Medications Ordered in the ED - No data to display                                Medical Decision Making Amount and/or Complexity of Data Reviewed Labs: ordered. Radiology: ordered.  Risk Prescription drug management. Decision regarding hospitalization.   Patient is a 74 year old male who presents today for evaluation of altered mental status as well as hypoxia.  On initial assessment patient was noted to be hemodynamically stable.  Initially noted to be hypothermic.  Oxygen saturations in the mid 80s on room air requiring 4 L nasal cannula with improvement to 95%.  On my bedside assessment patient was noted to be somnolent however easily arousable to verbal stimuli.  Patient is currently alert and oriented to person and place.  Follows commands however with generalized overall weakness.  No focal deficits appreciated on examination.  Pupils equal and reactive.  Patient's respirations are even and unlabored however he does have diffuse rhonchi on auscultation.  Patient's laboratory evaluation was notable for sodium of 150, CO2 of 19, mild transaminitis, normal lactic acid, urinalysis with evidence of moderate leukocyte esterase, CT chest abdomen pelvis with evidence of bibasilar atelectasis.  VBG without evidence of acidosis.  Patient also had evidence of hyperglycemia with initial blood glucose of 53 on metabolic panel subsequent receiving D50 bolus with improvement of blood sugar.  Patient did have gradual improvement of his body temperature with external rewarming.  Patient did have  slightly downtrending blood pressure.  Given his associated hypoglycemia and hypothermia, will treat for sepsis and was provided vancomycin  and Zosyn  for initial antibiotic therapy.  There is no clear source at this point in time as patient's urine does not appear grossly infected and CT chest abdomen pelvis without any evidence of clear source.  Will need admission to the inpatient service for further management.   Final diagnoses:  Sepsis, due to unspecified organism, unspecified whether acute organ dysfunction present [facility])    ED Discharge Orders     None          Laurita Sieving, MD 12/27/24 9964    Fredia Rosette Kirsch, MD 12/27/24 1647  "

## 2024-12-26 NOTE — ED Notes (Signed)
 Bair hugger applied.

## 2024-12-27 ENCOUNTER — Inpatient Hospital Stay (HOSPITAL_COMMUNITY)

## 2024-12-27 DIAGNOSIS — I639 Cerebral infarction, unspecified: Secondary | ICD-10-CM | POA: Diagnosis not present

## 2024-12-27 DIAGNOSIS — E162 Hypoglycemia, unspecified: Secondary | ICD-10-CM

## 2024-12-27 DIAGNOSIS — R9082 White matter disease, unspecified: Secondary | ICD-10-CM

## 2024-12-27 DIAGNOSIS — R410 Disorientation, unspecified: Secondary | ICD-10-CM

## 2024-12-27 DIAGNOSIS — E870 Hyperosmolality and hypernatremia: Secondary | ICD-10-CM | POA: Diagnosis not present

## 2024-12-27 DIAGNOSIS — R569 Unspecified convulsions: Secondary | ICD-10-CM | POA: Diagnosis not present

## 2024-12-27 DIAGNOSIS — T68XXXA Hypothermia, initial encounter: Secondary | ICD-10-CM | POA: Diagnosis not present

## 2024-12-27 DIAGNOSIS — I4891 Unspecified atrial fibrillation: Secondary | ICD-10-CM

## 2024-12-27 DIAGNOSIS — I480 Paroxysmal atrial fibrillation: Secondary | ICD-10-CM | POA: Diagnosis not present

## 2024-12-27 DIAGNOSIS — R4182 Altered mental status, unspecified: Secondary | ICD-10-CM

## 2024-12-27 DIAGNOSIS — J189 Pneumonia, unspecified organism: Secondary | ICD-10-CM

## 2024-12-27 LAB — I-STAT ARTERIAL BLOOD GAS, ED
Acid-base deficit: 2 mmol/L (ref 0.0–2.0)
Bicarbonate: 23.3 mmol/L (ref 20.0–28.0)
Calcium, Ion: 1.36 mmol/L (ref 1.15–1.40)
HCT: 31 % — ABNORMAL LOW (ref 39.0–52.0)
Hemoglobin: 10.5 g/dL — ABNORMAL LOW (ref 13.0–17.0)
O2 Saturation: 91 %
Patient temperature: 98.6
Potassium: 4.3 mmol/L (ref 3.5–5.1)
Sodium: 150 mmol/L — ABNORMAL HIGH (ref 135–145)
TCO2: 25 mmol/L (ref 22–32)
pCO2 arterial: 41.2 mmHg (ref 32–48)
pH, Arterial: 7.361 (ref 7.35–7.45)
pO2, Arterial: 63 mmHg — ABNORMAL LOW (ref 83–108)

## 2024-12-27 LAB — CBG MONITORING, ED
Glucose-Capillary: 102 mg/dL — ABNORMAL HIGH (ref 70–99)
Glucose-Capillary: 112 mg/dL — ABNORMAL HIGH (ref 70–99)
Glucose-Capillary: 134 mg/dL — ABNORMAL HIGH (ref 70–99)
Glucose-Capillary: 123 mg/dL — ABNORMAL HIGH (ref 70–99)

## 2024-12-27 LAB — CBC
HCT: 32.1 % — ABNORMAL LOW (ref 39.0–52.0)
Hemoglobin: 10.1 g/dL — ABNORMAL LOW (ref 13.0–17.0)
MCH: 28.5 pg (ref 26.0–34.0)
MCHC: 31.5 g/dL (ref 30.0–36.0)
MCV: 90.7 fL (ref 80.0–100.0)
Platelets: 149 10*3/uL — ABNORMAL LOW (ref 150–400)
RBC: 3.54 MIL/uL — ABNORMAL LOW (ref 4.22–5.81)
RDW: 16.9 % — ABNORMAL HIGH (ref 11.5–15.5)
WBC: 7.3 10*3/uL (ref 4.0–10.5)
nRBC: 0.5 % — ABNORMAL HIGH (ref 0.0–0.2)

## 2024-12-27 LAB — COMPREHENSIVE METABOLIC PANEL WITH GFR
ALT: 212 U/L — ABNORMAL HIGH (ref 0–44)
AST: 80 U/L — ABNORMAL HIGH (ref 15–41)
Albumin: 3.2 g/dL — ABNORMAL LOW (ref 3.5–5.0)
Alkaline Phosphatase: 187 U/L — ABNORMAL HIGH (ref 38–126)
Anion gap: 13 (ref 5–15)
BUN: 24 mg/dL — ABNORMAL HIGH (ref 8–23)
CO2: 21 mmol/L — ABNORMAL LOW (ref 22–32)
Calcium: 9.7 mg/dL (ref 8.9–10.3)
Chloride: 115 mmol/L — ABNORMAL HIGH (ref 98–111)
Creatinine, Ser: 1.11 mg/dL (ref 0.61–1.24)
GFR, Estimated: >60 mL/min
Glucose, Bld: 91 mg/dL (ref 70–99)
Potassium: 4.6 mmol/L (ref 3.5–5.1)
Sodium: 148 mmol/L — ABNORMAL HIGH (ref 135–145)
Total Bilirubin: 0.3 mg/dL (ref 0.0–1.2)
Total Protein: 7.2 g/dL (ref 6.5–8.1)

## 2024-12-27 LAB — GLUCOSE, CAPILLARY: Glucose-Capillary: 119 mg/dL — ABNORMAL HIGH (ref 70–99)

## 2024-12-27 LAB — T4, FREE: Free T4: 0.9 ng/dL (ref 0.80–2.00)

## 2024-12-27 LAB — TROPONIN T, HIGH SENSITIVITY: Troponin T High Sensitivity: 13 ng/L (ref 0–19)

## 2024-12-27 LAB — PROCALCITONIN: Procalcitonin: 1.66 ng/mL

## 2024-12-27 LAB — AMMONIA: Ammonia: 26 umol/L (ref 9–35)

## 2024-12-27 MED ORDER — VANCOMYCIN HCL 500 MG/100ML IV SOLN
500.0000 mg | Freq: Two times a day (BID) | INTRAVENOUS | Status: DC
  Administered 2024-12-27 – 2024-12-28 (×2): 500 mg via INTRAVENOUS
  Filled 2024-12-27 (×3): qty 100

## 2024-12-27 MED ORDER — SODIUM CHLORIDE 3 % IN NEBU
4.0000 mL | INHALATION_SOLUTION | Freq: Once | RESPIRATORY_TRACT | Status: AC
  Administered 2024-12-27: 4 mL via RESPIRATORY_TRACT
  Filled 2024-12-27: qty 4

## 2024-12-27 MED ORDER — ENOXAPARIN SODIUM 40 MG/0.4ML IJ SOSY
40.0000 mg | PREFILLED_SYRINGE | INTRAMUSCULAR | Status: DC
  Administered 2024-12-27: 40 mg via SUBCUTANEOUS
  Filled 2024-12-27: qty 0.4

## 2024-12-27 MED ORDER — INSULIN ASPART 100 UNIT/ML IJ SOLN
0.0000 [IU] | Freq: Three times a day (TID) | INTRAMUSCULAR | Status: AC
  Filled 2024-12-27: qty 6

## 2024-12-27 MED ORDER — SODIUM CHLORIDE 0.9 % IV SOLN
2.0000 g | Freq: Three times a day (TID) | INTRAVENOUS | Status: DC
  Administered 2024-12-27 – 2024-12-28 (×4): 2 g via INTRAVENOUS
  Filled 2024-12-27 (×4): qty 12.5

## 2024-12-27 MED ORDER — THIAMINE MONONITRATE 100 MG PO TABS
100.0000 mg | ORAL_TABLET | Freq: Every day | ORAL | Status: DC
  Administered 2024-12-28 – 2024-12-29 (×2): 100 mg via ORAL
  Filled 2024-12-27 (×2): qty 1

## 2024-12-27 MED ORDER — APIXABAN 5 MG PO TABS
5.0000 mg | ORAL_TABLET | Freq: Two times a day (BID) | ORAL | Status: DC
  Administered 2024-12-27 – 2024-12-29 (×4): 5 mg via ORAL
  Filled 2024-12-27 (×5): qty 1

## 2024-12-27 MED ORDER — DEXTROSE IN LACTATED RINGERS 5 % IV SOLN: INTRAVENOUS | Status: AC

## 2024-12-27 MED ORDER — IOHEXOL 350 MG/ML SOLN
75.0000 mL | Freq: Once | INTRAVENOUS | Status: AC | PRN
  Administered 2024-12-27: 75 mL via INTRAVENOUS

## 2024-12-27 NOTE — Progress Notes (Signed)
 " PROGRESS NOTE    Raymond Gordon  FMW:[id number] DOB: 02-Nov-1951 DOA: 12/26/2024 PCP: [name], Richard W, MD  Outpatient Specialists:     Brief Narrative:  Patient was admitted earlier today.  As per H&P done earlier today by Dr. Gatha Fuss: Raymond Gordon is a 74 y.o. male with medical history significant of [facility] with shortness of breath and acute metabolic encephalopathy.  Patient has history of diabetes, atrial fibrillation, chronic diastolic heart failure, paroxysmal atrial fibrillation, obstructive sleep apnea, presented to with oxygen sats 88% on room air and is on 3 L.  Patient was placed on 4 L/min.  Patient is hypothermic with temperature of 94 currently on Bair hugger.  Also sodium 150 as well as acute metabolic encephalopathy.  Patient's lactate is normal.  She also has elevated LFTs.  Negative for viral illness and negative urinalysis and chest x-ray.  Patient may have had severe sepsis but normal lactate normal white count and no fever.  Patient will be admitted for further evaluation and treatment.  12/28/2023: Due to patient's altered mentation, patient is not able to give any significant history.  Nursing staff reported increasing oxygen requirement earlier today.  This was later found to be likely secondary to mucous plug.  With suctioning, it is resolved.  ABG done revealed PH of 7.361, pCO2 of 41.2 and pO2 of 63.  Supplemental oxygen has been decreased from 6 L/min to 4 L/min.  Nursing staff also reported brief left-sided weakness and facial droop.  Code stroke was called.  Patient was seen by Dr. Michaela.  MRI brain did not reveal any acute infarct.  EEG was suggestive of mild generalized cerebral dysfunction (encephalopathy), no seizures or epileptiform discharges were seen throughout the recording.  CT chest, abdomen and pelvis done revealed bilateral dependent lower lobe consolidations, favor atelectasis over pneumonia, cardiomegaly, marked  prostatomegaly, with evidence of chronic bladder outlet obstruction.  Aortic atherosclerosis was also reported.  Discussed with patient's sister, [name] Essex, over the phone.  Ms. Noemi Essex tells me that she is a engineer, civil (consulting).  Ms. Essex tells me the patient's confusion started about 2 to 3 days ago.  No fever, however, patient was hypothermic on presentation.  Intermittent memory deficits reported.  Ms. [name] Essex was updated significantly.     Assessment & Plan:   Principal Problem:   Hypothermia Active Problems:   Paroxysmal A-fib (HCC)   Essential hypertension   Type 2 diabetes mellitus with hyperlipidemia (HCC)   Severe OSA (obstructive sleep apnea)   Chronic diastolic heart failure (HCC)   Obesity, class 3 (HCC)   Hypernatremia   Altered metabolic encephalopathy: Acute hypoxic respiratory failure: Mucous plug: Possible pneumonia: Hypothermia: Hypoglycemia: Possible sepsis: Paroxysmal atrial fibrillation: Chronic diastolic heart failure: Hypertension: Type 2 diabetes mellitus: Severe obstructive sleep apnea:  -Nebs hypertonic saline - Aggressive suctioning and respiratory care/hygiene - Follow cultures - Continue antibiotics.  Guarded prognosis.  DVT prophylaxis: Change subcutaneous Lovenox  to home dose of apixaban . Code Status: Full code.  I personally discussed with patient's sister, [name] Essex. Family Communication: Patient's sister, Mrs. Essex, over the phone. Disposition Plan: Inpatient   Consultants:  Neurology  Procedures:  None  Antimicrobials:  IV vancomycin  IV cefepime    Subjective: No history from patient  Objective: Vitals:   12/27/24 0900 12/27/24 0945 12/27/24 1009 12/27/24 1011  BP: (!) 127/55 131/60 (!) 132/55   Pulse: 77 78 85 85  Resp: (!) 32 (!) 22 (!) 36 (!) 26  Temp:      TempSrc:      SpO2: 98% 99% 98% 100%  Weight:      Height:        Intake/Output Summary (Last 24 hours) at 12/27/2024 1040 Last data  filed at 12/27/2024 0228 Gross per 24 hour  Intake 548.29 ml  Output 700 ml  Net -151.71 ml   Filed Weights   12/26/24 2319  Weight: 103.9 kg    Examination:  General exam: Patient is obese.  Patient is easily arousable.    Respiratory system: Clear to auscultation. Respiratory effort normal. Cardiovascular system: S1 & S2 heard Gastrointestinal system: Abdomen is morbidly obese, soft and nontender.   Central nervous system: Patient responds to voice.  Not awake.  Easily arousable.  A Extremities: Fullness of the ankle. Skin: Chronic skin changes lower legs, likely secondary from chronic edema.  Data Reviewed: I have personally reviewed following labs and imaging studies  CBC: Recent Labs  Lab 12/26/24 2104 12/26/24 2113 12/27/24 0319 12/27/24 0919  WBC 5.9  --  7.3  --   NEUTROABS 4.7  --   --   --   HGB 11.4* 11.9* 10.1* 10.5*  HCT 36.9* 35.0* 32.1* 31.0*  MCV 91.8  --  90.7  --   PLT 165  --  149*  --    Basic Metabolic Panel: Recent Labs  Lab 12/26/24 2104 12/26/24 2113 12/27/24 0319 12/27/24 0919  NA 150* 151* 148* 150*  K 4.8 4.4 4.6 4.3  CL 117*  --  115*  --   CO2 19*  --  21*  --   GLUCOSE 53*  --  91  --   BUN 23  --  24*  --   CREATININE 1.04  --  1.11  --   CALCIUM  10.3  --  9.7  --    GFR: Estimated Creatinine Clearance: 63.7 mL/min (by C-G formula based on SCr of 1.11 mg/dL). Liver Function Tests: Recent Labs  Lab 12/26/24 2104 12/27/24 0319  AST 96* 80*  ALT 247* 212*  ALKPHOS 216* 187*  BILITOT 0.3 0.3  PROT 8.0 7.2  ALBUMIN 3.5 3.2*   No results for input(s): LIPASE, AMYLASE in the last 168 hours. No results for input(s): AMMONIA in the last 168 hours. Coagulation Profile: No results for input(s): INR, PROTIME in the last 168 hours. Cardiac Enzymes: No results for input(s): CKTOTAL, CKMB, CKMBINDEX, TROPONINI in the last 168 hours. BNP (last 3 results) Recent Labs    12/26/24 2104  PROBNP 243.0    HbA1C: No results for input(s): HGBA1C in the last 72 hours. CBG: Recent Labs  Lab 12/26/24 2347 12/27/24 0302 12/27/24 0802  GLUCAP 139* 102* 112*   Lipid Profile: No results for input(s): CHOL, HDL, LDLCALC, TRIG, CHOLHDL, LDLDIRECT in the last 72 hours. Thyroid Function Tests: Recent Labs    12/26/24 2104 12/26/24 2318  TSH 9.120*  --   FREET4  --  0.90   Anemia Panel: No results for input(s): VITAMINB12, FOLATE, FERRITIN, TIBC, IRON, RETICCTPCT in the last 72 hours. Urine analysis:    Component Value Date/Time   COLORURINE STRAW (A) 12/26/2024 2104   APPEARANCEUR CLEAR 12/26/2024 2104   LABSPEC 1.006 12/26/2024 2104   PHURINE 5.0 12/26/2024 2104   GLUCOSEU NEGATIVE 12/26/2024 2104   HGBUR NEGATIVE 12/26/2024 2104   BILIRUBINUR NEGATIVE 12/26/2024 2104   KETONESUR NEGATIVE 12/26/2024 2104   PROTEINUR NEGATIVE 12/26/2024 2104   UROBILINOGEN 1.0 08/18/2015 0610   NITRITE NEGATIVE  12/26/2024 2104   LEUKOCYTESUR MODERATE (A) 12/26/2024 2104   Sepsis Labs: @LABRCNTIP (procalcitonin:4,lacticidven:4)  ) Recent Results (from the past 240 hours)  Resp panel by RT-PCR (RSV, Flu A&B, Covid) Anterior Nasal Swab     Status: None   Collection Time: 12/26/24  9:07 PM   Specimen: Anterior Nasal Swab  Result Value Ref Range Status   SARS Coronavirus 2 by RT PCR NEGATIVE NEGATIVE Final   Influenza A by PCR NEGATIVE NEGATIVE Final   Influenza B by PCR NEGATIVE NEGATIVE Final    Comment: (NOTE) The Xpert Xpress SARS-CoV-2/FLU/RSV plus assay is intended as an aid in the diagnosis of influenza from Nasopharyngeal swab specimens and should not be used as a sole basis for treatment. Nasal washings and aspirates are unacceptable for Xpert Xpress SARS-CoV-2/FLU/RSV testing.  Fact Sheet for Patients: bloggercourse.com  Fact Sheet for Healthcare Providers: seriousbroker.it  This test is not yet  approved or cleared by the [place]  FDA and has been authorized for detection and/or diagnosis of SARS-CoV-2 by FDA under an Emergency Use Authorization (EUA). This EUA will remain in effect (meaning this test can be used) for the duration of the COVID-19 declaration under Section 564(b)(1) of the Act, 21 U.S.C. section 360bbb-3(b)(1), unless the authorization is terminated or revoked.     Resp Syncytial Virus by PCR NEGATIVE NEGATIVE Final    Comment: (NOTE) Fact Sheet for Patients: bloggercourse.com  Fact Sheet for Healthcare Providers: seriousbroker.it  This test is not yet approved or cleared by the [place]  FDA and has been authorized for detection and/or diagnosis of SARS-CoV-2 by FDA under an Emergency Use Authorization (EUA). This EUA will remain in effect (meaning this test can be used) for the duration of the COVID-19 declaration under Section 564(b)(1) of the Act, 21 U.S.C. section 360bbb-3(b)(1), unless the authorization is terminated or revoked.  Performed at [facility], [address], [place], [name] [address]   Culture, blood (routine x 2)     Status: None (Preliminary result)   Collection Time: 12/26/24  9:20 PM   Specimen: BLOOD LEFT HAND  Result Value Ref Range Status   Specimen Description BLOOD LEFT HAND  Final   Special Requests   Final    BOTTLES DRAWN AEROBIC AND ANAEROBIC Blood Culture results may not be optimal due to an inadequate volume of blood received in culture bottles   Culture   Final    NO GROWTH < 12 HOURS Performed at [facility], [address], [place], [name] [address]    Report Status PENDING  Incomplete  Culture, blood (routine x 2)     Status: None (Preliminary result)   Collection Time: 12/26/24  9:25 PM   Specimen: BLOOD RIGHT FOREARM  Result Value Ref Range Status   Specimen Description BLOOD RIGHT FOREARM  Final   Special Requests   Final    BOTTLES  DRAWN AEROBIC AND ANAEROBIC Blood Culture adequate volume   Culture   Final    NO GROWTH < 12 HOURS Performed at [facility], [address] [place], [name] [address]    Report Status PENDING  Incomplete         Radiology Studies: CT ANGIO HEAD NECK W WO CM (CODE STROKE) Result Date: 12/27/2024 EXAM: CTA HEAD AND NECK WITH AND WITHOUT 12/27/2024 10:11:44 AM TECHNIQUE: CTA of the head and neck was performed with and without the administration of 75 mL iohexol  (OMNIPAQUE ) 350 MG/ML injection. Multiplanar 2D and/or 3D reformatted  images are provided for review. Automated exposure control, iterative reconstruction, and/or weight based adjustment of the mA/kV was utilized to reduce the radiation dose to as low as reasonably achievable. Stenosis of the internal carotid arteries measured using NASCET criteria. COMPARISON: Same day CT head and CTA head and neck 11/12/2024 CLINICAL HISTORY: Neuro deficit, acute, stroke suspected. Acute neurologic deficit; suspected stroke. FINDINGS: CTA NECK: AORTIC ARCH AND ARCH VESSELS: Mild atherosclerosis of the aortic arch. Mild atherosclerosis along the proximal right subclavian artery. The distal right subclavian and right axillary arteries are obscured due to streak artifact from dense venous contrast. Atherosclerosis along the proximal left subclavian artery without high grade stenosis. No dissection or arterial injury. No significant stenosis of the brachiocephalic arteries. CERVICAL CAROTID ARTERIES: Ossified atherosclerosis along the proximal right cervical ICA without hemodynamically significant stenosis. Calcified atherosclerosis at the left carotid bifurcation and left carotid bulb. There is bulky near circumferential atherosclerotic plaque along the left carotid bulb/proximal left cervical ICA which results in approximately 80% stenosis similar to prior. No dissection, arterial injury. CERVICAL VERTEBRAL ARTERIES: Similar occlusion of the left vertebral  artery at its origin. The left vertebral artery remains occluded to the intracranial segment. There is additional atherosclerosis at the right vertebral artery origin which results in moderate stenosis. The right vertebral artery is patent to the vertebrobasilar confluence. No dissection, arterial injury. LUNGS AND MEDIASTINUM: Unremarkable. SOFT TISSUES: No acute abnormality. BONES: Degenerative changes in the visualized spine. Maxilla and mandible are visualized. SINUSES: Mucosal thickening in the bilateral ethmoid sinuses. Mucosal thickening and air fluid levels in the bilateral maxillary sinuses left greater than right. CTA HEAD: ANTERIOR CIRCULATION: Atherosclerosis in the bilateral carotid siphons without high grade stenosis. There is fetal origin of the bilateral PCAs. No significant stenosis of the anterior cerebral arteries. No significant stenosis of the middle cerebral arteries. No aneurysm. POSTERIOR CIRCULATION: There is fetal origin of the bilateral PCAs. The basilar artery is patent with distal tapering noted beyond the origins of the superior cerebellar arteries. The left vertebral artery remains occluded to the intracranial segment. The right vertebral artery is patent to the vertebrobasilar confluence. No significant stenosis of the posterior cerebral arteries. No aneurysm. OTHER: No dural venous sinus thrombosis on this non-dedicated study. IMPRESSION: 1. No acute large vessel occlusion. 2. Occlusion of the left vertebral artery at its origin, remaining occluded to the intracranial segment. Findings are chronic and unchanged. 3. Bulky near circumferential atherosclerotic plaque along the left carotid bulb/proximal left cervical ICA resulting in approximately 80% stenosis, similar to prior. 4. Moderate stenosis of the right vertebral artery origin. Electronically signed by: Donnice Mania MD 12/27/2024 10:31 AM EST RP Workstation: [id number]   CT HEAD CODE STROKE WO CONTRAST Result Date:  12/27/2024 EXAM: CT HEAD WITHOUT CONTRAST 12/27/2024 09:49:38 AM TECHNIQUE: CT of the head was performed without the administration of intravenous contrast. Automated exposure control, iterative reconstruction, and/or weight based adjustment of the mA/kV was utilized to reduce the radiation dose to as low as reasonably achievable. COMPARISON: 12/26/2024 CLINICAL HISTORY: Acute neurologic deficit; stroke suspected. FINDINGS: BRAIN AND VENTRICLES: No acute hemorrhage. No evidence of acute infarct. No hydrocephalus. No extra-axial collection. No mass effect or midline shift. Remote lacunar infarct in the right thalamus. Suspected additional remote lacunar infarct in the left thalamus. Redemonstrated remote infarcts in the right cerebellum. Moderate chronic microvascular ischemic change. Bilateral basal ganglia mineralization. ORBITS: Bilateral lens replacement. SINUSES: Mucosal thickening bilateral maxillary sinuses. SOFT TISSUES AND SKULL: No acute soft tissue abnormality. No skull fracture.  [place] Stroke Program Early CT (ASPECT) score: Ganglionic (caudate, IC, lentiform nucleus, insula, M1-M3): 7 Supraganglionic (M4-M6): 3 Total: 10 IMPRESSION: 1. No acute intracranial abnormality. 2. ASPECTS 10. 3. Remote lacunar infarct in the right thalamus, suspected additional remote lacunar infarct in the left thalamus, and remote infarct in the right cerebellum. 4. Findings messaged to Dr. Michaela via the [facility] messaging system at 9:59 AM on 12/27/24. Electronically signed by: Donnice Mania MD 12/27/2024 10:02 AM EST RP Workstation: [id number]   CT CHEST ABDOMEN PELVIS W CONTRAST Result Date: 12/26/2024 CLINICAL DATA:  Sepsis, short of breath, altered level of consciousness EXAM: CT CHEST, ABDOMEN, AND PELVIS WITH CONTRAST TECHNIQUE: Multidetector CT imaging of the chest, abdomen and pelvis was performed following the standard protocol during bolus administration of intravenous contrast. RADIATION DOSE REDUCTION: This exam  was performed according to the departmental dose-optimization program which includes automated exposure control, adjustment of the mA and/or kV according to patient size and/or use of iterative reconstruction technique. CONTRAST:  75mL OMNIPAQUE  IOHEXOL  350 MG/ML SOLN COMPARISON:  12/26/2024, 06/25/2015 FINDINGS: CT CHEST FINDINGS Cardiovascular: Borderline cardiomegaly without pericardial effusion. No evidence of thoracic aortic aneurysm or dissection. Atherosclerosis of the aorta and coronary vasculature. Mediastinum/Nodes: No enlarged mediastinal, hilar, or axillary lymph nodes. Thyroid gland, trachea, and esophagus demonstrate no significant findings. Lungs/Pleura: Dependent areas of linear consolidation within the bilateral lower lobes, left greater than right, favor atelectasis over airspace disease. No effusion or pneumothorax. Musculoskeletal: No acute or destructive bony abnormalities. Reconstructed images demonstrate no additional findings. CT ABDOMEN PELVIS FINDINGS Hepatobiliary: Moderate gallbladder distention without cholelithiasis or cholecystitis. The liver is unremarkable without biliary duct dilation. Pancreas: Unremarkable. No pancreatic ductal dilatation or surrounding inflammatory changes. Spleen: Normal in size without focal abnormality. Adrenals/Urinary Tract: The adrenals are stable. Simple left renal cyst does not require imaging follow-up. Kidneys enhance normally. No urinary tract calculi or obstructive uropathy. The bladder is decompressed, with diffuse bladder wall thickening likely reflecting sequela of chronic bladder outlet obstruction given marked enlargement of the prostate. Stomach/Bowel: No bowel obstruction or ileus. Normal appendix right lower quadrant. No bowel wall thickening or inflammatory change. Vascular/Lymphatic: Aortic atherosclerosis. No enlarged abdominal or pelvic lymph nodes. Reproductive: Stable marked enlargement of the prostate. Other: No free fluid or free  intraperitoneal gas. No abdominal wall hernia. Musculoskeletal: No acute or destructive bony abnormalities. Bilateral hip osteoarthritis. Reconstructed images demonstrate no additional findings. IMPRESSION: 1. Bilateral dependent lower lobe consolidation, favor atelectasis over pneumonia. 2. Cardiomegaly. 3. Marked prostatomegaly, with evidence of chronic bladder outlet obstruction. 4.  Aortic Atherosclerosis (ICD10-I70.0). Electronically Signed   By: Ozell Daring M.D.   On: 12/26/2024 23:56   CT Head Wo Contrast Result Date: 12/26/2024 EXAM: CT HEAD WITHOUT CONTRAST 12/26/2024 09:42:00 PM TECHNIQUE: CT of the head was performed without the administration of intravenous contrast. Automated exposure control, iterative reconstruction, and/or weight based adjustment of the mA/kV was utilized to reduce the radiation dose to as low as reasonably achievable. COMPARISON: Brain MRI at 11/12/2024. CLINICAL HISTORY: Mental status change, unknown cause. FINDINGS: BRAIN AND VENTRICLES: No acute hemorrhage. No evidence of acute infarct. There is mild-to-moderate global atrophy and moderate small vessel disease of the cerebral white matter with mild atrophic ventriculomegaly. Minimal calcification in the basal ganglia. There is a small chronic linear lacunar infarct in the right cerebellar hemisphere. There is a tiny chronic right thalamic lacunar infarct. Patchy calcific plaque in both carotid siphons. No hyperdense vessel is seen. No extra-axial collection. No mass effect or midline shift. ORBITS:  Lens placements are again noted, otherwise negative orbits. SINUSES: There is mild membrane disease in the frontal, sphenoid, and maxillary sinuses, low-density mild fluid in both maxillary sinuses which could indicate acute-on-chronic sinusitis, and moderate patchy mucosal thickening throughout the ethmoid air cells. The findings are new from 11/12/2024. The mastoid air cells are clear. SOFT TISSUES AND SKULL: No acute soft  tissue abnormality. No skull fracture. IMPRESSION: 1. Atrophy, small vessel changes, and old lacunar infarcts. No acute intracranial CT findings. 2. Mild membrane disease in the frontal, sphenoid, and maxillary sinuses, low-density mild fluid in both maxillary sinuses suggestive of acute-on-chronic sinusitis, and moderate patchy mucosal thickening throughout the ethmoid air cells, all new from 11/12/2024. Electronically signed by: Francis Quam MD 12/26/2024 10:05 PM EST RP Workstation: [id number]   DG Chest Portable 1 View Result Date: 12/26/2024 CLINICAL DATA:  Altered level of consciousness EXAM: PORTABLE CHEST 1 VIEW COMPARISON:  11/12/2024 FINDINGS: Single frontal view of the chest demonstrates an unremarkable cardiac silhouette. No airspace disease, effusion, or pneumothorax. No acute bony abnormalities. IMPRESSION: 1. No acute intrathoracic process. Electronically Signed   By: Ozell Daring M.D.   On: 12/26/2024 21:36        Scheduled Meds:  enoxaparin  (LOVENOX ) injection  40 mg Subcutaneous Q24H   insulin  aspart  0-6 Units Subcutaneous TID WC   sodium chloride  HYPERTONIC  4 mL Nebulization Once   Continuous Infusions:  ceFEPime  (MAXIPIME ) IV Stopped (12/27/24 9376)   dextrose  5% lactated ringers  100 mL/hr at 12/27/24 0303   vancomycin        LOS: 0 days    Time spent: 55 minutes.    Leatrice Chapel, MD  [facility] 7PM-7AM contact night coverage as above    "

## 2024-12-27 NOTE — ED Notes (Signed)
 Pt sat 87% on 6L nasal cannula, pt moved to NRB and sat 92-94% at this time. MD notified

## 2024-12-27 NOTE — ED Notes (Addendum)
 Warm blankets applied. Mittens also applied as pt ripped off his telemetry leads as well as removing and IV.

## 2024-12-27 NOTE — ED Notes (Signed)
 MRI is calling about pt informed nadine and emily.

## 2024-12-27 NOTE — Progress Notes (Signed)
 EEG complete - results pending

## 2024-12-27 NOTE — ED Notes (Signed)
 Pt w/ episode of spontaneous eye opening and reaching towards the ceiling. Was able to track this RN w/ his eyes when this RN moved to the computer and now he is back to responding to pain.

## 2024-12-27 NOTE — ED Notes (Signed)
 EEG at bedside.

## 2024-12-27 NOTE — ED Notes (Signed)
 Code Stroke activated with [facility].  LKW: 8am Stroke Symptoms- left facial droop, left unequal grip strength with left sided weakness.

## 2024-12-27 NOTE — Procedures (Signed)
 Patient Name: BRIXTON FRANKO  MRN: [id number]  Epilepsy Attending: Arlin MALVA Krebs  Referring Physician/Provider: Waddell Karna LABOR, NP  Date: 12/27/2024 Duration: 23.11 mins  Patient history: 74yo M with ams. EEG to evaluate for seizure  Level of alertness: Awake  AEDs during EEG study: None  Technical aspects: This EEG study was done with scalp electrodes positioned according to the 10-20 International system of electrode placement. Electrical activity was reviewed with band pass filter of 1-70Hz , sensitivity of 7 uV/mm, display speed of 49mm/sec with a 60Hz  notched filter applied as appropriate. EEG data were recorded continuously and digitally stored.  Video monitoring was available and reviewed as appropriate.  Description: The posterior dominant rhythm consists of 8 Hz activity of moderate voltage (25-35 uV) seen predominantly in posterior head regions, symmetric and reactive to eye opening and eye closing. EEG showed intermittent generalized 3 to 6 Hz theta-delta slowing. Hyperventilation and photic stimulation were not performed.     ABNORMALITY - Intermittent slow, generalized  IMPRESSION: This study is suggestive of mild generalized cerebral dysfunction ( encephalopathy). No seizures or epileptiform discharges were seen throughout the recording.  Matilde Pottenger O Anju Sereno

## 2024-12-27 NOTE — Code Documentation (Signed)
 Raymond Gordon is a 74 yr old male with PMH of AF, CHF, DM, OSA in [facility] for SOB, encephalopathy and concern for sepsis. On Eliquis . Was reported to be neurologically intact, however his RN found him altered at 0930. At that time pt had decreased LOC, and left sided weakness.   Stroke team met pt in CT scanner. CTNC neg for hemorrhage per Dr. Michaela. Current NIHSS 18. Please see documentation for timeline and NIHSS details. CTA obtained as well, and was negative for LVO per Dr. Michaela.   Pt returned to ED room 47 where his workup will continue. He will need q 2 hr NIHSS and VS for 12 hr, then q 4. NPO until cleared by stroke swallow screen or SLP. Pt is not eligible for thrombolytic due to unclear LKW, and Eliquis  use. Pt is not candidate for EVT due to no LVO on imaging. Bedside handoff with Cheslea RN complete.

## 2024-12-28 ENCOUNTER — Inpatient Hospital Stay (HOSPITAL_COMMUNITY)

## 2024-12-28 ENCOUNTER — Other Ambulatory Visit: Payer: Self-pay

## 2024-12-28 DIAGNOSIS — T68XXXA Hypothermia, initial encounter: Secondary | ICD-10-CM | POA: Diagnosis not present

## 2024-12-28 DIAGNOSIS — R4182 Altered mental status, unspecified: Secondary | ICD-10-CM

## 2024-12-28 LAB — CBC WITH DIFFERENTIAL/PLATELET
Abs Immature Granulocytes: 0.02 10*3/uL (ref 0.00–0.07)
Basophils Absolute: 0 10*3/uL (ref 0.0–0.1)
Basophils Relative: 0 %
Eosinophils Absolute: 0 10*3/uL (ref 0.0–0.5)
Eosinophils Relative: 1 %
HCT: 30.3 % — ABNORMAL LOW (ref 39.0–52.0)
Hemoglobin: 9.5 g/dL — ABNORMAL LOW (ref 13.0–17.0)
Immature Granulocytes: 0 %
Lymphocytes Relative: 18 %
Lymphs Abs: 1 10*3/uL (ref 0.7–4.0)
MCH: 28.5 pg (ref 26.0–34.0)
MCHC: 31.4 g/dL (ref 30.0–36.0)
MCV: 91 fL (ref 80.0–100.0)
Monocytes Absolute: 0.7 10*3/uL (ref 0.1–1.0)
Monocytes Relative: 12 %
Neutro Abs: 3.7 10*3/uL (ref 1.7–7.7)
Neutrophils Relative %: 69 %
Platelets: 158 10*3/uL (ref 150–400)
RBC: 3.33 MIL/uL — ABNORMAL LOW (ref 4.22–5.81)
RDW: 16.9 % — ABNORMAL HIGH (ref 11.5–15.5)
Smear Review: NORMAL
WBC: 5.4 10*3/uL (ref 4.0–10.5)
nRBC: 0.4 % — ABNORMAL HIGH (ref 0.0–0.2)

## 2024-12-28 LAB — COMPREHENSIVE METABOLIC PANEL WITH GFR
ALT: 175 U/L — ABNORMAL HIGH (ref 0–44)
AST: 69 U/L — ABNORMAL HIGH (ref 15–41)
Albumin: 3.1 g/dL — ABNORMAL LOW (ref 3.5–5.0)
Alkaline Phosphatase: 168 U/L — ABNORMAL HIGH (ref 38–126)
Anion gap: 7 (ref 5–15)
BUN: 26 mg/dL — ABNORMAL HIGH (ref 8–23)
CO2: 24 mmol/L (ref 22–32)
Calcium: 9.8 mg/dL (ref 8.9–10.3)
Chloride: 120 mmol/L — ABNORMAL HIGH (ref 98–111)
Creatinine, Ser: 1.14 mg/dL (ref 0.61–1.24)
GFR, Estimated: >60 mL/min
Glucose, Bld: 123 mg/dL — ABNORMAL HIGH (ref 70–99)
Potassium: 3.9 mmol/L (ref 3.5–5.1)
Sodium: 152 mmol/L — ABNORMAL HIGH (ref 135–145)
Total Bilirubin: 0.3 mg/dL (ref 0.0–1.2)
Total Protein: 7.1 g/dL (ref 6.5–8.1)

## 2024-12-28 LAB — PHOSPHORUS: Phosphorus: 2.7 mg/dL (ref 2.5–4.6)

## 2024-12-28 LAB — GLUCOSE, CAPILLARY
Glucose-Capillary: 120 mg/dL — ABNORMAL HIGH (ref 70–99)
Glucose-Capillary: 129 mg/dL — ABNORMAL HIGH (ref 70–99)
Glucose-Capillary: 133 mg/dL — ABNORMAL HIGH (ref 70–99)

## 2024-12-28 LAB — MAGNESIUM: Magnesium: 2.4 mg/dL (ref 1.7–2.4)

## 2024-12-28 LAB — T4, FREE: Free T4: 0.81 ng/dL (ref 0.80–2.00)

## 2024-12-28 LAB — PRO BRAIN NATRIURETIC PEPTIDE: Pro Brain Natriuretic Peptide: 415 pg/mL — ABNORMAL HIGH

## 2024-12-28 LAB — VITAMIN B12: Vitamin B-12: 2512 pg/mL — ABNORMAL HIGH (ref 180–914)

## 2024-12-28 MED ORDER — DEXTROSE 5 % IV SOLN: INTRAVENOUS | Status: DC

## 2024-12-28 MED ORDER — TAMSULOSIN HCL 0.4 MG PO CAPS
0.4000 mg | ORAL_CAPSULE | Freq: Every day | ORAL | Status: DC
  Administered 2024-12-28 – 2024-12-29 (×2): 0.4 mg via ORAL
  Filled 2024-12-28 (×2): qty 1

## 2024-12-28 MED ORDER — DEXTROSE 5 % IV SOLN: INTRAVENOUS | Status: AC

## 2024-12-28 MED ORDER — SODIUM CHLORIDE 0.9 % IV SOLN
3.0000 g | Freq: Four times a day (QID) | INTRAVENOUS | Status: DC
  Administered 2024-12-28 – 2024-12-29 (×4): 3 g via INTRAVENOUS
  Filled 2024-12-28 (×4): qty 8

## 2024-12-28 MED ORDER — PANTOPRAZOLE SODIUM 40 MG PO TBEC
40.0000 mg | DELAYED_RELEASE_TABLET | Freq: Every day | ORAL | Status: DC
  Administered 2024-12-28 – 2024-12-29 (×2): 40 mg via ORAL
  Filled 2024-12-28 (×2): qty 1

## 2024-12-28 MED ORDER — ALLOPURINOL 300 MG PO TABS
300.0000 mg | ORAL_TABLET | Freq: Every morning | ORAL | Status: DC
  Administered 2024-12-28 – 2024-12-29 (×2): 300 mg via ORAL
  Filled 2024-12-28 (×2): qty 1

## 2024-12-28 MED ORDER — CHLORHEXIDINE GLUCONATE CLOTH 2 % EX PADS
6.0000 | MEDICATED_PAD | Freq: Every day | CUTANEOUS | Status: AC
  Administered 2024-12-28 – 2024-12-31 (×4): 6 via TOPICAL

## 2024-12-28 MED ORDER — FINASTERIDE 5 MG PO TABS
5.0000 mg | ORAL_TABLET | Freq: Every evening | ORAL | Status: DC
  Administered 2024-12-28 – 2024-12-29 (×2): 5 mg via ORAL
  Filled 2024-12-28 (×2): qty 1

## 2024-12-28 NOTE — Progress Notes (Signed)
 RT x2 assessed patient. Patient was lying in the bed with mittens on and gurgling was verbally heard. Noted that patient was unable to clear secretions. NTS was performed as well as oral suctioning. Patient was uncooperative and attempting to push at therapist. A moderate amount of secretions were obtained. RT will continue to monitor as requested.

## 2024-12-28 NOTE — Hospital Course (Addendum)
 Raymond Gordon is a 74 y.o. male with PMH of f A fib on Eliquis , diabetes mellitus, congestive heart failure, HTN, HLD, obesity and OSA, with left sided chronic weakness for several months, dementia with sundowning who presented to [facility] ED on 12/26/24 for evaluation of shortness of breath, hypoxia and altered mental status. In the ED was noted to be hypoxic, hypothermic and with soft BP's.  Patient unable to provide any history.  Reportedly at baseline able to converse, has chronic left-sided weakness undergoing rehab Last admission 12/19> 11/30/24-strokelike symptom, altered mental status and treated for dementia with sundowning EEG MRI negative for acute stroke but with chronic ischemia and seen and cleared by neurology In the ED: Vitals fairly stable although hypoxic needing 4 L nasal cannula at 1 point needed nonrebreather, Labs showed hyponatremia, mild metabolic acidosis stable renal function, mild transaminitis normal lactic acid Pro-Cal 1.6 CBC with mild chronic anemia UA with W 0-5 moderate LE ketone negative nitrate negative. Underwent extensive workup with imagings: CXR:no active disease. CT Head/CT head code stroke and CT angio head and neck> atrophy small vessel changes and old infarcts mucosal thickening in sinuses, chronic occlusion lt Vertebral Artery, similar 80% stenosis in the left carotid bulb/proximal left cervical ICA CT chest abdomen pelvis with contrast>> bilateral lower lobe consolidation favored atelectasis over pneumonia, cardiomegaly, marked prostatomegaly with chronic BOO, aortic atherosclerosis. EEG 2/2>mild generalized cerebral dysfunction ( encephalopathy). No seizures or epileptiform discharges  MRI brain: No acute infarct, old lacunar infarct  Subjective: Seen and examined Appears disoriented today, able to vocalize tries to answer questions-he thinks he is in [place] OT working with him. Overnight remains afebrile BP stable down to 3 L nasal cannula Labs reviewed  sodium improving at 150 has mild transaminitis, CBC stable w/ mild anemia  Assessment and plan:  Acute metabolic encephalopathy Dementia with sundowning Right arm weakness per sist- CODE stroke and ruled out: Patient's encephalopathy likely multifactorial in the setting of dementia with sundowning, urine retention, MRI has lacunar infarcts, question polypharmacy trazodone  stopped.Recurrent admission for altered mental status has had extensive workup-with multiple imaging, EEG and neurology evaluation -no obvious pneumonia or UTI-noted to have urine retention in the setting of BPH-Foley inserted 2/3.TSH-9.1 but euthyroid with normal free T4 ammonia and B12 stable, thiamine  normal on 11/16/24 His mentation was much better on 2/3 but today again confused and repetitive with answers -waxing and waning mentation Continue PT OT, SLP eval. Overall guarded prognosis recurrent hospitalization.  Palliative care consulted.  Plan for SNF/eventually.   Keep on delirium precaution fall precaution minimize benzos sedativesm reorient   Acute hypoxic respiratory failure Mucous plugging: Patient hypoxic for EMS and has been needing initially and NRB-  now on [place]-patient having secretions and mucous plugging likely contributing to his hypoxia in the setting of altered mental status,? aspiration pneumonia. repeat CXR 2/3 low volume,vascular congestion and interstitial edema noted- CT on admission favor atelectasis Admitted with empiric vancomycin  and cefepime > changed to Unasyn  2/3-> given hypernatremia changing to ceftriaxone -S/P pharmacy Continue RT eval suction, bronchodilators, pulmonary toileting.Requested speech eval he may be pocketing in his mouth.  Hypernatremia: Sodium improving on D5, continue IVF, encourage oral hydration Recent Labs  Lab 12/26/24 2113 12/27/24 0319 12/27/24 0919 12/28/24 0246 12/29/24 0256  NA 151* 148* 150* 152* 150*    Hypothermia Soft blood pressure Possible  sepsis: Patient with low temperature soft BP on presentation.blood culture-NGTD.  Continue antibiotics as above, sepsis ruled out  BPH w/  marked prostatomegaly with chronic BOO on  CT Abd: In September s/p prostate art embolization Dr Waynard recurrent urinary retention placed Foley catheter after consulting urology.  Continue  finasteride  Flomax . Previously had indwelling catheter- has been out sicne early November 2025.He sees Dr Devere.  I discussed with the urology team-plan to discharge with Foley and have outpatient follow-up   Severe OSA: ABG on admission with stable pCO2 41 and pH.  Resume home CPAP as able   Transaminitis: AST ALT slightly elevated, monitor labs  CT on pelvis moderate colonic distention without cholelithiasis or cholecystitis on CT liver unremarkable without biliary dilatation. Recent Labs  Lab 12/26/24 2104 12/27/24 0319 12/27/24 2303 12/28/24 0246 12/29/24 0256  AST 96* 80*  --  69* 137*  ALT 247* 212*  --  175* 196*  ALKPHOS 216* 187*  --  168* 187*  BILITOT 0.3 0.3  --  0.3 0.3  PROT 8.0 7.2  --  7.1 7.3  ALBUMIN 3.5 3.2*  --  3.1* 3.1*  AMMONIA  --   --  26  --   --   PLT 165 149*  --  158 165     Type 2 diabetes mellitus with hyperlipidemia: Blood sugar remains well-controlled, continue SSI 0 to 6 units.  PTA on 18 units Lantus  at bedtime and now currently on hold. Recent Labs  Lab 12/27/24 2338 12/28/24 0616 12/28/24 1140 12/28/24 1545 12/29/24 0708  GLUCAP 119* 120* 129* 133* 143*    CHFpEF Essential hypertension: pBNP stable.Lasix  was discontinued on last admission.  PTA amlodipine  10, losartan  100- resume as needed  PAF: On Eliquis . Continue patient  Chronic left lower extremity weakness Degenerative disc disease mild to moderate diffuse spinal stenosis C2-3 through C6-7 disc bulges at multiple level and facet arthropathy: Patient has had extensive workup recently as well with negative MRI as well as CT T and L-spine plan is for  EMG NCS as outpatient.  Continue PT OT as able.  HLD: Resume Lipitor  80 as able-once LFT improves.  Gout: Continue allopurinol .  CKD 3a: Renal function stable as below continue to monitor Recent Labs    11/20/24 0145 11/21/24 0928 11/22/24 0148 11/26/24 1003 11/29/24 0137 11/30/24 0152 12/26/24 2104 12/26/24 2113 12/27/24 0319 12/27/24 0919 12/28/24 0246 12/29/24 0256  BUN 50* 31* 34* 38* 45* 39* 23  --  24*  --  26* 27*  CREATININE 1.16 0.87 1.25* 1.14 1.14 0.99 1.04  --  1.11  --  1.14 1.06  CO2 26 24 25  21* 18* 23 19*  --  21*  --  24 22  K 4.1 4.7 4.7 4.6 5.4* 4.8 4.8 4.4 4.6 4.3 3.9 3.5    Class II Obesity w/ Body mass index is 39.96 kg/m.: Will benefit with PCP follow-up, weight loss,healthy lifestyle and outpatient sleep eval if not done.  Deconditioning/debility Mobility: PT Orders: Active PT Follow up Rec: Skilled Nursing-Short Term Rehab (<3 Hours/Day)12/28/2024 1300   DVT prophylaxis: Eliquis  po  Code Status:   Code Status: Full Code Family Communication: plan of care discussed with patient  and called his sister [name] again today- she is aware of his  serious- Dr. Augustin serious thank you   Patient status is: Remains hospitalized because of severity of illness Level of care: Progressive   Dispo: The patient is from: Home w/ sister.            Anticipated disposition:SNF once improves  Objective: Vitals last 24 hrs: Vitals:   12/28/24 2011 12/29/24 0013 12/29/24 0429 12/29/24 0759  BP: ROLLEN)  155/68 128/64 132/61   Pulse: 60 60 60   Resp: 20 (!) 28 18   Temp: (!) 97.5 F (36.4 C) 97.6 F (36.4 C) 97.6 F (36.4 C)   TempSrc: Oral Oral Oral Oral  SpO2: 97% 93% 93%   Weight:      Height:       Physical Examination: General exam: AA, oriented to self, in distress HEENT:Oral mucosa moist, Ear/Nose WNL grossly Respiratory system: Bilaterally diminished breath sound with no use of accessory muscle Cardiovascular system: S1 & S2 +, No  JVD. Gastrointestinal system: Abdomen soft, obese,ND, BS+ Nervous System: Alert, awake, moving  extremities,and following commands. Extremities: extremities warm, leg chronic hyperkeratotic skin changes  Skin: Warm, no rashes MSK: Normal muscle bulk,tone, power   Medications reviewed:  Scheduled Meds:  allopurinol   300 mg Oral q AM   apixaban   5 mg Oral BID   Chlorhexidine  Gluconate Cloth  6 each Topical Daily   finasteride   5 mg Oral QPM   insulin  aspart  0-6 Units Subcutaneous TID WC   pantoprazole   40 mg Oral Daily   tamsulosin   0.4 mg Oral Daily   thiamine   100 mg Oral Daily   Continuous Infusions:  cefTRIAXone  (ROCEPHIN )  IV     dextrose  40 mL/hr at 12/29/24 0857   Diet: Diet Order             Diet heart healthy/carb modified Room service appropriate? Yes; Fluid consistency: Thin  Diet effective now

## 2024-12-28 NOTE — Evaluation (Signed)
 Physical Therapy Evaluation Patient Details Name: Raymond Gordon MRN: [id number] DOB: 04/07/1951 Today's Date: 12/28/2024  History of Present Illness  PT is 74 yo presenting [facility] on 2/1 due to hypothermia, shortness of breathe and acute metabolic encephalopathy. PMHx: DMII, HLD, gout, afib, HTN, CHF, OSA, CKDIII, anemia, dementia, BPH with chronic indwelling foley catheter.  Clinical Impression  Currently pt is Min A for bed mobility and sit to stand with RW. Pt requires Min A for side steps at EOB and was unable to progress gait away from EOB due to weakness/fatigue. Due to pt current functional status, home set up and available assistance at home recommending skilled physical therapy services < 3 hours/day in order to address strength, balance and functional mobility to decrease risk for falls, injury, immobility, skin break down and re-hospitalization.          If plan is discharge home, recommend the following: A little help with walking and/or transfers;Help with stairs or ramp for entrance;Assist for transportation;Assistance with cooking/housework;Supervision due to cognitive status   Can travel by private vehicle   No    Equipment Recommendations Rolling walker (2 wheels);BSC/3in1     Functional Status Assessment Patient has had a recent decline in their functional status and demonstrates the ability to make significant improvements in function in a reasonable and predictable amount of time.     Precautions / Restrictions Precautions Precautions: Fall Recall of Precautions/Restrictions: Impaired Restrictions Weight Bearing Restrictions Per Provider Order: No      Mobility  Bed Mobility Overal bed mobility: Needs Assistance Bed Mobility: Supine to Sit, Sit to Supine     Supine to sit: Min assist, HOB elevated, Used rails Sit to supine: Min assist   General bed mobility comments: Min A with trunk to mid line and LE to EOB. Cues for sequencing with use of bed rail and HOB  slightly elevated.    Transfers Overall transfer level: Needs assistance Equipment used: Rolling walker (2 wheels) Transfers: Sit to/from Stand Sit to Stand: Min assist           General transfer comment: Min A for sit to stand from EOB with verbal cues for safe hand placement.    Ambulation/Gait   Pre-gait activities: Pt was able to take side steps at EOB At Min A with heavy multi modal cues and assist with AD with very low floor clearance.      Balance Overall balance assessment: Needs assistance Sitting-balance support: Single extremity supported, Feet supported Sitting balance-Leahy Scale: Fair     Standing balance support: Bilateral upper extremity supported, During functional activity, Reliant on assistive device for balance Standing balance-Leahy Scale: Poor Standing balance comment: Min A         Pertinent Vitals/Pain Pain Assessment Pain Assessment: No/denies pain    Home Living Family/patient expects to be discharged to:: Skilled nursing facility     Additional Comments: Pt is a poor historian states he is living with his mom and dad.    Prior Function Prior Level of Function : Needs assist;Patient poor historian/Family not available     Mobility Comments: From previous note pt ambulates with Rollator in home and [facility] in community. ADLs Comments: From previous note pt is Mod I for self care, fixing meals and neighbor across the street helps with cleaning.     Extremity/Trunk Assessment   Upper Extremity Assessment Upper Extremity Assessment: Defer to OT evaluation    Lower Extremity Assessment Lower Extremity Assessment: Generalized weakness  Cervical / Trunk Assessment Cervical / Trunk Assessment: Normal  Communication   Communication Communication: No apparent difficulties    Cognition Arousal: Alert Behavior During Therapy: WFL for tasks assessed/performed, Flat affect   PT - Cognitive impairments: History of cognitive impairments      Following commands: Impaired Following commands impaired: Follows one step commands with increased time, Follows multi-step commands inconsistently     Cueing Cueing Techniques: Verbal cues, Tactile cues     General Comments General comments (skin integrity, edema, etc.): Vital sign stable on 5L O2 via [place] with HFNC        Assessment/Plan    PT Assessment Patient needs continued PT services  PT Problem List Decreased strength;Decreased balance;Decreased activity tolerance;Decreased mobility       PT Treatment Interventions DME instruction;Therapeutic exercise;Gait training;Stair training;Functional mobility training;Therapeutic activities;Patient/family education    PT Goals (Current goals can be found in the Care Plan section)  Acute Rehab PT Goals Patient Stated Goal: to improve mobility PT Goal Formulation: With patient Time For Goal Achievement: 01/11/25 Potential to Achieve Goals: Fair    Frequency Min 2X/week        AM-PAC PT 6 Clicks Mobility  Outcome Measure Help needed turning from your back to your side while in a flat bed without using bedrails?: A Little Help needed moving from lying on your back to sitting on the side of a flat bed without using bedrails?: A Little Help needed moving to and from a bed to a chair (including a wheelchair)?: A Little Help needed standing up from a chair using your arms (e.g., wheelchair or bedside chair)?: A Little Help needed to walk in hospital room?: A Lot Help needed climbing 3-5 steps with a railing? : Total 6 Click Score: 15    End of Session Equipment Utilized During Treatment: Gait belt Activity Tolerance: Patient tolerated treatment well Patient left: in bed;with call bell/phone within reach;with bed alarm set Nurse Communication: Mobility status PT Visit Diagnosis: Unsteadiness on feet (R26.81);Other abnormalities of gait and mobility (R26.89);Muscle weakness (generalized) (M62.81)    Time: 8888-8865 PT  Time Calculation (min) (ACUTE ONLY): 23 min   Charges:   PT Evaluation $PT Eval Low Complexity: 1 Low PT Treatments $Therapeutic Activity: 8-22 mins PT General Charges $$ ACUTE PT VISIT: 1 Visit         Dorothyann Maier, DPT, CLT  Acute Rehabilitation Services Office: [phone] (Secure chat preferred)   Dorothyann VEAR Maier 12/28/2024, 1:01 PM

## 2024-12-28 NOTE — Progress Notes (Signed)
 NEUROLOGY CONSULT FOLLOW UP NOTE   Date of service: December 28, 2024 Patient Name: Raymond Gordon MRN:  [id number] DOB:  31-Mar-1951  Interval Hx/subjective   No family at the bedside. Patient laying in bed on [place] and bilateral mittens on in NAD. Patient laying with eyes closed. He awakens to voice.  No new neurological events overnight.   Vitals   Vitals:   12/27/24 2341 12/28/24 0245 12/28/24 0356 12/28/24 0755  BP: (!) 133/45  (!) 140/65 (!) 140/65  Pulse: (!) 53 60 (!) 59   Resp: 18 16 19    Temp: (!) 97.4 F (36.3 C)  97.6 F (36.4 C)   TempSrc: Axillary  Axillary   SpO2: 93% 95% 100%   Weight:      Height:         Body mass index is 39.96 kg/m.  Physical Exam   Constitutional: chronically ill elderly african american male  Psych: flat Eyes: No scleral injection.   HENT: No OP obstrucion.   Head: Normocephalic.   Cardiovascular: Normal rate and regular rhythm.   Respiratory: Effort normal, non-labored breathing.   GI: Soft.  No distension. There is no tenderness.   Skin: WDI.  Bilateral lower extremities with edema   Neurologic Examination   Mental Status -  Eyes closed initially, opens eyes to voice, can state his name and age. Not able to state current place or situation. Not cooperative with all testing of exam.    Cranial Nerves II - XII - II - Visual field blinks to threat bilaterally  III, IV, VI - Extraocular movements intact . V - Facial sensation intact bilaterally . VII - Facial movement intact bilaterally . VIII - Hearing & vestibular intact bilaterally . X,XI, XII - unable to assess   Motor Strength - horizontal movement of  all 4 extremities, will not lift off the bed and resists when I attempt to assist  Sensory -responds to noxious stimuli  Coordination - unable to assess due to cooperation    Gait and Station - deferred.  Medications Current Medications[1]  Labs and Diagnostic Imaging   CBC:  Recent Labs  Lab 12/26/24 2104  12/26/24 2113 12/27/24 0319 12/27/24 0919 12/28/24 0246  WBC 5.9  --  7.3  --  5.4  NEUTROABS 4.7  --   --   --  3.7  HGB 11.4*   < > 10.1* 10.5* 9.5*  HCT 36.9*   < > 32.1* 31.0* 30.3*  MCV 91.8  --  90.7  --  91.0  PLT 165  --  149*  --  158   < > = values in this interval not displayed.    Basic Metabolic Panel:  Lab Results  Component Value Date   NA 152 (H) 12/28/2024   K 3.9 12/28/2024   CO2 24 12/28/2024   GLUCOSE 123 (H) 12/28/2024   BUN 26 (H) 12/28/2024   CREATININE 1.14 12/28/2024   CALCIUM  9.8 12/28/2024   GFRNONAA >60 12/28/2024   GFRAA >60 08/21/2015   Lipid Panel:  Lab Results  Component Value Date   LDLCALC 76 11/13/2024   HgbA1c:  Lab Results  Component Value Date   HGBA1C 9.2 (H) 11/13/2024   Urine Drug Screen:     Component Value Date/Time   LABOPIA NEGATIVE 11/12/2024 2010   COCAINSCRNUR NEGATIVE 11/12/2024 2010   LABBENZ NEGATIVE 11/12/2024 2010   AMPHETMU NEGATIVE 11/12/2024 2010   THCU NEGATIVE 11/12/2024 2010   LABBARB NEGATIVE 11/12/2024 2010  Alcohol  Level     Component Value Date/Time   [facility] <15 11/12/2024 1858   INR  Lab Results  Component Value Date   INR 1.2 11/12/2024   APTT  Lab Results  Component Value Date   APTT 40 (H) 11/12/2024   AED levels: No results found for: PHENYTOIN, ZONISAMIDE, LAMOTRIGINE, LEVETIRACETA  CT Head without contrast(Personally reviewed): 1. No acute intracranial abnormality. 2. ASPECTS 10. 3. Remote lacunar infarct in the right thalamus, suspected additional remote lacunar infarct in the left thalamus, and remote infarct in the right cerebellum.   CT angio Head and Neck with contrast  No LVO   MRI Brain(Personally reviewed): No acute infarct  Mild chronic white matter abnormalities and old lacunar infarct in the right cerebellum.      rEEG 2/2:    Intermittent slow, generalized   IMPRESSION: This study is suggestive of mild generalized cerebral dysfunction (  encephalopathy). No seizures or epileptiform discharges were seen throughout the recording.   Labs  Ammonia 26 Na 152 AST 69 ALT 168  Assessment   AMIL BOUWMAN is a 74 y.o. male hx of A fib on Eliquis , DM, CHF, HTN, HLD, obesity and OSA who presented to [facility] for evaluation of SOB and altered MS on 2/1. Patient was noted to be hypoxic, hypothermic and with soft BP's. Per RN his exam has been fluctuating. Code stroke was activated for acute onset of left facial droop, left side weakness and weak left grip. LKW 0800. CT head with no acute process   Recommendations  - Delirium precautions  - Correct electrolyte abnormalities per primary team  - Evaluate for and Treat underlying infections per primary team  ______________________________________________________________________   Signed, Karna DELENA Geralds, NP [facility]    [1]  Current Facility-Administered Medications:    apixaban  (ELIQUIS ) tablet 5 mg, 5 mg, Oral, BID, Ogbata, Sylvester I, MD, 5 mg at 12/27/24 2250   ceFEPIme  (MAXIPIME ) 2 g in sodium chloride  0.9 % 100 mL IVPB, 2 g, Intravenous, Q8H, Laron Agent, RPH, Last Rate: 200 mL/hr at 12/28/24 0644, 2 g at 12/28/24 9355   insulin  aspart (novoLOG ) injection 0-6 Units, 0-6 Units, Subcutaneous, TID WC, Garba, Mohammad L, MD   thiamine  (VITAMIN B1) tablet 100 mg, 100 mg, Oral, Daily, Ogbata, Sylvester I, MD   vancomycin  (VANCOREADY) IVPB 500 mg/100 mL, 500 mg, Intravenous, Q12H, Laron Agent, RPH, Last Rate: 100 mL/hr at 12/28/24 0017, 500 mg at 12/28/24 0017

## 2024-12-29 DIAGNOSIS — T68XXXA Hypothermia, initial encounter: Secondary | ICD-10-CM | POA: Diagnosis not present

## 2024-12-29 LAB — CBC
HCT: 32.2 % — ABNORMAL LOW (ref 39.0–52.0)
Hemoglobin: 10 g/dL — ABNORMAL LOW (ref 13.0–17.0)
MCH: 28.5 pg (ref 26.0–34.0)
MCHC: 31.1 g/dL (ref 30.0–36.0)
MCV: 91.7 fL (ref 80.0–100.0)
Platelets: 165 10*3/uL (ref 150–400)
RBC: 3.51 MIL/uL — ABNORMAL LOW (ref 4.22–5.81)
RDW: 16.9 % — ABNORMAL HIGH (ref 11.5–15.5)
WBC: 6.4 10*3/uL (ref 4.0–10.5)
nRBC: 1.4 % — ABNORMAL HIGH (ref 0.0–0.2)

## 2024-12-29 LAB — BLOOD GAS, VENOUS
Acid-base deficit: 1.8 mmol/L (ref 0.0–2.0)
Bicarbonate: 24.3 mmol/L (ref 20.0–28.0)
O2 Saturation: 66.2 %
Patient temperature: 36
pCO2, Ven: 44 mmHg (ref 44–60)
pH, Ven: 7.34 (ref 7.25–7.43)
pO2, Ven: 37 mmHg (ref 32–45)

## 2024-12-29 LAB — COMPREHENSIVE METABOLIC PANEL WITH GFR
ALT: 196 U/L — ABNORMAL HIGH (ref 0–44)
AST: 137 U/L — ABNORMAL HIGH (ref 15–41)
Albumin: 3.1 g/dL — ABNORMAL LOW (ref 3.5–5.0)
Alkaline Phosphatase: 187 U/L — ABNORMAL HIGH (ref 38–126)
Anion gap: 10 (ref 5–15)
BUN: 27 mg/dL — ABNORMAL HIGH (ref 8–23)
CO2: 22 mmol/L (ref 22–32)
Calcium: 10 mg/dL (ref 8.9–10.3)
Chloride: 118 mmol/L — ABNORMAL HIGH (ref 98–111)
Creatinine, Ser: 1.06 mg/dL (ref 0.61–1.24)
GFR, Estimated: >60 mL/min
Glucose, Bld: 154 mg/dL — ABNORMAL HIGH (ref 70–99)
Potassium: 3.5 mmol/L (ref 3.5–5.1)
Sodium: 150 mmol/L — ABNORMAL HIGH (ref 135–145)
Total Bilirubin: 0.3 mg/dL (ref 0.0–1.2)
Total Protein: 7.3 g/dL (ref 6.5–8.1)

## 2024-12-29 LAB — GLUCOSE, CAPILLARY
Glucose-Capillary: 143 mg/dL — ABNORMAL HIGH (ref 70–99)
Glucose-Capillary: 132 mg/dL — ABNORMAL HIGH (ref 70–99)
Glucose-Capillary: 119 mg/dL — ABNORMAL HIGH (ref 70–99)
Glucose-Capillary: 139 mg/dL — ABNORMAL HIGH (ref 70–99)

## 2024-12-29 MED ORDER — PROCHLORPERAZINE EDISYLATE 10 MG/2ML IJ SOLN: 5.0000 mg | Freq: Four times a day (QID) | INTRAMUSCULAR | Status: DC | PRN

## 2024-12-29 MED ORDER — DEXTROSE 5 % IV SOLN: INTRAVENOUS | Status: AC

## 2024-12-29 MED ORDER — SODIUM CHLORIDE 0.9 % IV SOLN
2.0000 g | INTRAVENOUS | Status: AC
  Administered 2024-12-29 – 2024-12-31 (×3): 2 g via INTRAVENOUS
  Filled 2024-12-29 (×3): qty 20

## 2024-12-29 NOTE — Plan of Care (Signed)
   Problem: Fluid Volume: Goal: Ability to maintain a balanced intake and output will improve Outcome: Progressing

## 2024-12-29 NOTE — Progress Notes (Signed)
 " PROGRESS NOTE JOSIYAH Gordon  FMW:[id number] DOB: 04/15/1951 DOA: 12/26/2024 PCP: [name], Richard W, MD  Brief Narrative/Hospital Course: Raymond Gordon is a 74 y.o. male with PMH of f A fib on Eliquis , diabetes mellitus, congestive heart failure, HTN, HLD, obesity and OSA, with left sided chronic weakness for several months, dementia with sundowning who presented to [facility] ED on 12/26/24 for evaluation of shortness of breath, hypoxia and altered mental status. In the ED was noted to be hypoxic, hypothermic and with soft BP's.  Patient unable to provide any history.  Reportedly at baseline able to converse, has chronic left-sided weakness undergoing rehab Last admission 12/19> 11/30/24-strokelike symptom, altered mental status and treated for dementia with sundowning EEG MRI negative for acute stroke but with chronic ischemia and seen and cleared by neurology In the ED: Vitals fairly stable although hypoxic needing 4 L nasal cannula at 1 point needed nonrebreather, Labs showed hyponatremia, mild metabolic acidosis stable renal function, mild transaminitis normal lactic acid Pro-Cal 1.6 CBC with mild chronic anemia UA with W 0-5 moderate LE ketone negative nitrate negative. Underwent extensive workup with imagings: CXR:no active disease. CT Head/CT head code stroke and CT angio head and neck> atrophy small vessel changes and old infarcts mucosal thickening in sinuses, chronic occlusion lt Vertebral Artery, similar 80% stenosis in the left carotid bulb/proximal left cervical ICA CT chest abdomen pelvis with contrast>> bilateral lower lobe consolidation favored atelectasis over pneumonia, cardiomegaly, marked prostatomegaly with chronic BOO, aortic atherosclerosis. EEG 2/2>mild generalized cerebral dysfunction ( encephalopathy). No seizures or epileptiform discharges  MRI brain: No acute infarct, old lacunar infarct  Subjective: Seen and examined Appears disoriented today, able to vocalize tries to answer  questions-he thinks he is in [place] OT working with him. Overnight remains afebrile BP stable down to 3 L nasal cannula Labs reviewed sodium improving at 150 has mild transaminitis, CBC stable w/ mild anemia  Assessment and plan:  Acute metabolic encephalopathy Dementia with sundowning Right arm weakness per sist- CODE stroke and ruled out: Patient's encephalopathy likely multifactorial in the setting of dementia with sundowning, urine retention, MRI has lacunar infarcts, question polypharmacy trazodone  stopped.Recurrent admission for altered mental status has had extensive workup-with multiple imaging, EEG and neurology evaluation -no obvious pneumonia or UTI-noted to have urine retention in the setting of BPH-Foley inserted 2/3.TSH-9.1 but euthyroid with normal free T4 ammonia and B12 stable, thiamine  normal on 11/16/24 His mentation was much better on 2/3 but today again confused and repetitive with answers -waxing and waning mentation Continue PT OT, SLP eval. Overall guarded prognosis recurrent hospitalization.  Palliative care consulted.  Plan for SNF/eventually.   Keep on delirium precaution fall precaution minimize benzos sedativesm reorient   Acute hypoxic respiratory failure Mucous plugging: Patient hypoxic for EMS and has been needing initially and NRB-  now on [place]-patient having secretions and mucous plugging likely contributing to his hypoxia in the setting of altered mental status,? aspiration pneumonia. repeat CXR 2/3 low volume,vascular congestion and interstitial edema noted- CT on admission favor atelectasis Admitted with empiric vancomycin  and cefepime > changed to Unasyn  2/3-> given hypernatremia changing to ceftriaxone -S/P pharmacy Continue RT eval suction, bronchodilators, pulmonary toileting. Requested speech eval he may be pocketing in his mouth.  Hypernatremia: Sodium improving on D5, continue IVF, encourage oral hydration Recent Labs  Lab 12/26/24 2113  12/27/24 0319 12/27/24 0919 12/28/24 0246 12/29/24 0256  NA 151* 148* 150* 152* 150*    Hypothermia Soft blood pressure Possible sepsis: Patient with low temperature soft  BP on presentation.blood culture-NGTD.  Continue antibiotics as above, sepsis ruled out  BPH w/  marked prostatomegaly with chronic BOO on CT Abd: In September s/p prostate art embolization Dr Waynard recurrent urinary retention placed Foley catheter after consulting urology.  Continue  finasteride  Flomax . Previously had indwelling catheter- has been out sicne early November 2025.He sees Dr Devere.  I discussed with the urology team-plan to discharge with Foley and have outpatient follow-up   Severe OSA: ABG on admission with stable pCO2 41 and pH.  Resume home CPAP as able   Transaminitis: AST ALT slightly elevated, monitor labs  CT on pelvis moderate colonic distention without cholelithiasis or cholecystitis on CT liver unremarkable without biliary dilatation. Recent Labs  Lab 12/26/24 2104 12/27/24 0319 12/27/24 2303 12/28/24 0246 12/29/24 0256  AST 96* 80*  --  69* 137*  ALT 247* 212*  --  175* 196*  ALKPHOS 216* 187*  --  168* 187*  BILITOT 0.3 0.3  --  0.3 0.3  PROT 8.0 7.2  --  7.1 7.3  ALBUMIN 3.5 3.2*  --  3.1* 3.1*  AMMONIA  --   --  26  --   --   PLT 165 149*  --  158 165     Type 2 diabetes mellitus with hyperlipidemia: Blood sugar remains well-controlled, continue SSI 0 to 6 units.  PTA on 18 units Lantus  at bedtime and now currently on hold. Recent Labs  Lab 12/27/24 2338 12/28/24 0616 12/28/24 1140 12/28/24 1545 12/29/24 0708  GLUCAP 119* 120* 129* 133* 143*    CHFpEF Essential hypertension: pBNP stable.Lasix  was discontinued on last admission.  PTA amlodipine  10, losartan  100- resume as needed  PAF: On Eliquis . Continue patient  Chronic left lower extremity weakness Degenerative disc disease mild to moderate diffuse spinal stenosis C2-3 through C6-7 disc bulges at multiple  level and facet arthropathy: Patient has had extensive workup recently as well with negative MRI as well as CT T and L-spine plan is for EMG NCS as outpatient.  Continue PT OT as able  HLD: Resume Lipitor  80 as able-once LFT improves.  Gout: Continue allopurinol .  CKD 3a: Renal function stable as below continue to monitor Recent Labs    11/20/24 0145 11/21/24 0928 11/22/24 0148 11/26/24 1003 11/29/24 0137 11/30/24 0152 12/26/24 2104 12/26/24 2113 12/27/24 0319 12/27/24 0919 12/28/24 0246 12/29/24 0256  BUN 50* 31* 34* 38* 45* 39* 23  --  24*  --  26* 27*  CREATININE 1.16 0.87 1.25* 1.14 1.14 0.99 1.04  --  1.11  --  1.14 1.06  CO2 26 24 25  21* 18* 23 19*  --  21*  --  24 22  K 4.1 4.7 4.7 4.6 5.4* 4.8 4.8 4.4 4.6 4.3 3.9 3.5    Class II Obesity w/ Body mass index is 39.96 kg/m.: Will benefit with PCP follow-up, weight loss,healthy lifestyle and outpatient sleep eval if not done.  Deconditioning/debility Mobility: PT Orders: Active PT Follow up Rec: Skilled Nursing-Short Term Rehab (<3 Hours/Day)12/28/2024 1300   DVT prophylaxis: Eliquis  po  Code Status:   Code Status: Full Code Family Communication: plan of care discussed with patient  and called his sister [name] again today- she is aware of his  serious- Dr. Augustin serious thank you   Patient status is: Remains hospitalized because of severity of illness Level of care: Progressive   Dispo: The patient is from: Home w/ sister.            Anticipated  disposition:SNF once improves  Objective: Vitals last 24 hrs: Vitals:   12/28/24 2011 12/29/24 0013 12/29/24 0429 12/29/24 0759  BP: (!) 155/68 128/64 132/61   Pulse: 60 60 60   Resp: 20 (!) 28 18   Temp: (!) 97.5 F (36.4 C) 97.6 F (36.4 C) 97.6 F (36.4 C)   TempSrc: Oral Oral Oral Oral  SpO2: 97% 93% 93%   Weight:      Height:       Physical Examination: General exam: AA, oriented to self, in distress HEENT:Oral mucosa moist, Ear/Nose WNL  grossly Respiratory system: Bilaterally diminished breath sound with no use of accessory muscle Cardiovascular system: S1 & S2 +, No JVD. Gastrointestinal system: Abdomen soft, obese,ND, BS+ Nervous System: Alert, awake, moving  extremities,and following commands. Extremities: extremities warm, leg chronic hyperkeratotic skin changes  Skin: Warm, no rashes MSK: Normal muscle bulk,tone, power   Medications reviewed:  Scheduled Meds:  allopurinol   300 mg Oral q AM   apixaban   5 mg Oral BID   Chlorhexidine  Gluconate Cloth  6 each Topical Daily   finasteride   5 mg Oral QPM   insulin  aspart  0-6 Units Subcutaneous TID WC   pantoprazole   40 mg Oral Daily   tamsulosin   0.4 mg Oral Daily   thiamine   100 mg Oral Daily   Continuous Infusions:  cefTRIAXone  (ROCEPHIN )  IV     dextrose  40 mL/hr at 12/29/24 0857   Diet: Diet Order             Diet heart healthy/carb modified Room service appropriate? Yes; Fluid consistency: Thin  Diet effective now                     Unresulted Labs (From admission, onward)     Start     Ordered   12/30/24 0500  CBC  Tomorrow morning,   R        12/29/24 1043   12/30/24 0500  Comprehensive metabolic panel with GFR  Tomorrow morning,   R        12/29/24 1043   12/27/24 1530  Expectorated Sputum Assessment w Gram Stain, Rflx to Resp Cult  Once,   R        12/27/24 1529   12/27/24 1002  Expectorated Sputum Assessment w Gram Stain, Rflx to Resp Cult  Once,   R        12/27/24 1001           Data Reviewed: I have personally reviewed following labs and imaging studies ( see epic result tab) CBC: Recent Labs  Lab 12/26/24 2104 12/26/24 2113 12/27/24 0319 12/27/24 0919 12/28/24 0246 12/29/24 0256  WBC 5.9  --  7.3  --  5.4 6.4  NEUTROABS 4.7  --   --   --  3.7  --   HGB 11.4* 11.9* 10.1* 10.5* 9.5* 10.0*  HCT 36.9* 35.0* 32.1* 31.0* 30.3* 32.2*  MCV 91.8  --  90.7  --  91.0 91.7  PLT 165  --  149*  --  158 165   CMP: Recent Labs   Lab 12/26/24 2104 12/26/24 2113 12/27/24 0319 12/27/24 0919 12/28/24 0246 12/29/24 0256  NA 150* 151* 148* 150* 152* 150*  K 4.8 4.4 4.6 4.3 3.9 3.5  CL 117*  --  115*  --  120* 118*  CO2 19*  --  21*  --  24 22  GLUCOSE 53*  --  91  --  123* 154*  BUN 23  --  24*  --  26* 27*  CREATININE 1.04  --  1.11  --  1.14 1.06  CALCIUM  10.3  --  9.7  --  9.8 10.0  MG  --   --   --   --  2.4  --   PHOS  --   --   --   --  2.7  --    GFR: Estimated Creatinine Clearance: 67.3 mL/min (by C-G formula based on SCr of 1.06 mg/dL). Recent Labs  Lab 12/26/24 2104 12/27/24 0319 12/28/24 0246 12/29/24 0256  AST 96* 80* 69* 137*  ALT 247* 212* 175* 196*  ALKPHOS 216* 187* 168* 187*  BILITOT 0.3 0.3 0.3 0.3  PROT 8.0 7.2 7.1 7.3  ALBUMIN 3.5 3.2* 3.1* 3.1*   No results for input(s): LIPASE, AMYLASE in the last 168 hours.  Recent Labs  Lab 12/27/24 2303  AMMONIA 26   Coagulation Profile: No results for input(s): INR, PROTIME in the last 168 hours. Antimicrobials/Microbiology: Anti-infectives (From admission, onward)    Start     Dose/Rate Route Frequency Ordered Stop   12/29/24 1200  cefTRIAXone  (ROCEPHIN ) 2 g in sodium chloride  0.9 % 100 mL IVPB        2 g 200 mL/hr over 30 Minutes Intravenous Every 24 hours 12/29/24 1019 01/03/25 1159   12/28/24 1400  Ampicillin -Sulbactam (UNASYN ) 3 g in sodium chloride  0.9 % 100 mL IVPB  Status:  Discontinued        3 g 200 mL/hr over 30 Minutes Intravenous Every 6 hours 12/28/24 1121 12/29/24 1019   12/27/24 1000  vancomycin  (VANCOREADY) IVPB 500 mg/100 mL  Status:  Discontinued        500 mg 100 mL/hr over 60 Minutes Intravenous Every 12 hours 12/27/24 0038 12/28/24 1109   12/27/24 0600  ceFEPIme  (MAXIPIME ) 2 g in sodium chloride  0.9 % 100 mL IVPB  Status:  Discontinued        2 g 200 mL/hr over 30 Minutes Intravenous Every 8 hours 12/27/24 0038 12/28/24 1109   12/26/24 2330  vancomycin  (VANCOREADY) IVPB 2000 mg/400 mL        2,000  mg 200 mL/hr over 120 Minutes Intravenous  Once 12/26/24 2321 12/27/24 0217   12/26/24 2330  piperacillin -tazobactam (ZOSYN ) IVPB 3.375 g        3.375 g 100 mL/hr over 30 Minutes Intravenous  Once 12/26/24 2322 12/27/24 0019         Component Value Date/Time   SDES BLOOD RIGHT FOREARM 12/26/2024 2125   SPECREQUEST  12/26/2024 2125    BOTTLES DRAWN AEROBIC AND ANAEROBIC Blood Culture adequate volume   CULT  12/26/2024 2125    NO GROWTH 3 DAYS Performed at [facility], [address], [place], [name] [address]    REPTSTATUS PENDING 12/26/2024 2125    Procedures:   Mennie LAMY, MD [facility] 12/29/2024, 10:44 AM   "

## 2024-12-29 NOTE — Plan of Care (Signed)
" °  Problem: Fluid Volume: Goal: Ability to maintain a balanced intake and output will improve Outcome: Progressing   Problem: Nutritional: Goal: Progress toward achieving an optimal weight will improve Outcome: Progressing   Problem: Skin Integrity: Goal: Risk for impaired skin integrity will decrease Outcome: Progressing   Problem: Tissue Perfusion: Goal: Adequacy of tissue perfusion will improve Outcome: Progressing   Problem: Clinical Measurements: Goal: Ability to maintain clinical measurements within normal limits will improve Outcome: Progressing Goal: Will remain free from infection Outcome: Progressing Goal: Diagnostic test results will improve Outcome: Progressing Goal: Respiratory complications will improve Outcome: Progressing Goal: Cardiovascular complication will be avoided Outcome: Progressing   Problem: Elimination: Goal: Will not experience complications related to bowel motility Outcome: Progressing Goal: Will not experience complications related to urinary retention Outcome: Progressing   Problem: Pain Managment: Goal: General experience of comfort will improve and/or be controlled Outcome: Progressing   Problem: Education: Goal: Ability to describe self-care measures that may prevent or decrease complications (Diabetes Survival Skills Education) will improve Outcome: Not Progressing   Problem: Coping: Goal: Ability to adjust to condition or change in health will improve Outcome: Not Progressing   Problem: Health Behavior/Discharge Planning: Goal: Ability to identify and utilize available resources and services will improve Outcome: Not Progressing Goal: Ability to manage health-related needs will improve Outcome: Not Progressing   Problem: Metabolic: Goal: Ability to maintain appropriate glucose levels will improve Outcome: Not Progressing   Problem: Nutritional: Goal: Maintenance of adequate nutrition will improve Outcome: Not Progressing    Problem: Education: Goal: Knowledge of General Education information will improve Description: Including pain rating scale, medication(s)/side effects and non-pharmacologic comfort measures Outcome: Not Progressing   Problem: Health Behavior/Discharge Planning: Goal: Ability to manage health-related needs will improve Outcome: Not Progressing   Problem: Activity: Goal: Risk for activity intolerance will decrease Outcome: Not Progressing   Problem: Nutrition: Goal: Adequate nutrition will be maintained Outcome: Not Progressing   "

## 2024-12-29 NOTE — Progress Notes (Signed)
 SLP Cancellation Note  Patient Details Name: Raymond Gordon MRN: [id number] DOB: Apr 13, 1951   Cancelled treatment:       Reason Eval/Treat Not Completed: Patient at procedure or test/unavailable (receiving nursing care). SLP will attempt to reassess as schedule permits.    Damien Blumenthal, M.A., CCC-SLP Speech Language Pathology, Acute Rehabilitation Services  Secure Chat preferred [phone]  12/29/2024, 3:08 PM

## 2024-12-29 NOTE — Evaluation (Signed)
 Occupational Therapy Evaluation Patient Details Name: Raymond Gordon MRN: [id number] DOB: November 24, 1951 Today's Date: 12/29/2024   History of Present Illness   Patient is a 74 yo male presenting to [facility] on 2/1 due to hypothermia, shortness of breath and acute metabolic encephalopathy on 2/1. Code stroke activated and ruled out on 2/1, MRI clear showing no acute abnormalities. EEG showing cerebral dysfunction. PMHx: DMII, HLD, gout, afib, HTN, CHF, OSA, CKDIII, anemia, dementia, chronic L sided weakness, BPH with chronic indwelling foley catheter.     Clinical Impressions Patient seen for OT evaluation to date. Patient obtunded the majority of evaluation with no family present. Per OT evaluation from 12/25, patient was living alone and was indep with ADLs and mobility with RW. Has assistance for driving and household maintenance/medication mgmt from family. Currently, patient is total A of 2 for all aspects of care, perseverative that he is in [place] , and minimally able to follow one step commands. No activation noted when attempting bed mobility. OT recommending rehab < 3 hours when medically stable, will continue to follow.      If plan is discharge home, recommend the following:   Two people to help with walking and/or transfers;Two people to help with bathing/dressing/bathroom;Assistance with cooking/housework;Assistance with feeding;Direct supervision/assist for medications management;Direct supervision/assist for financial management;Assist for transportation;Help with stairs or ramp for entrance;Supervision due to cognitive status     Functional Status Assessment   Patient has had a recent decline in their functional status and demonstrates the ability to make significant improvements in function in a reasonable and predictable amount of time.     Equipment Recommendations   Other (comment) (defer to next venue)     Recommendations for Other Services          Precautions/Restrictions   Precautions Precautions: Fall Recall of Precautions/Restrictions: Impaired Precaution/Restrictions Comments: watch HR and O2 Restrictions Weight Bearing Restrictions Per Provider Order: No     Mobility Bed Mobility Overal bed mobility: Needs Assistance             General bed mobility comments: attempt to move, however minimally responding to commands and total A with no participation to activate    Transfers                   General transfer comment: deferred due to safety      Balance Overall balance assessment: Needs assistance                                         ADL either performed or assessed with clinical judgement   ADL Overall ADL's : Needs assistance/impaired Eating/Feeding: Total assistance   Grooming: Total assistance   Upper Body Bathing: Total assistance   Lower Body Bathing: Total assistance   Upper Body Dressing : Total assistance   Lower Body Dressing: Total assistance   Toilet Transfer: Total assistance   Toileting- Clothing Manipulation and Hygiene: Total assistance       Functional mobility during ADLs: Total assistance;+2 for physical assistance;+2 for safety/equipment;Cueing for safety;Cueing for sequencing General ADL Comments: Patient seen for OT evaluation to date. Patient obtunded the majority of evaluation with no family present. Per OT evaluation from 12/25, patient was living alone and was indep with ADLs and mobility with RW. Has assistance for driving and household maintenance/medication mgmt from family. Currently, patient is total A of 2 for all aspects of  care, perseverative that he is in [facility], and minimally able to follow one step commands. No activation noted when attempting bed mobility. OT recommending rehab < 3 hours when medically stable, will continue to follow.     Vision         Perception Perception: Not tested       Praxis Praxis: Not  tested       Pertinent Vitals/Pain Pain Assessment Pain Assessment: PAINAD Faces Pain Scale: No hurt Breathing: occasional labored breathing, short period of hyperventilation Negative Vocalization: none Facial Expression: smiling or inexpressive Body Language: tense, distressed pacing, fidgeting Consolability: distracted or reassured by voice/touch PAINAD Score: 3 Pain Intervention(s): Limited activity within patient's tolerance, Monitored during session, Repositioned     Extremity/Trunk Assessment Upper Extremity Assessment Upper Extremity Assessment: Generalized weakness   Lower Extremity Assessment Lower Extremity Assessment: Defer to PT evaluation       Communication Communication Communication: Impaired Factors Affecting Communication: Reduced clarity of speech;Difficulty expressing self   Cognition Arousal: Obtunded Behavior During Therapy: Flat affect Cognition: History of cognitive impairments, Difficult to assess, Cognition impaired Difficult to assess due to: Impaired communication, Level of arousal Orientation impairments: Place, Time, Situation Awareness: Intellectual awareness impaired, Online awareness impaired Memory impairment (select all impairments): Short-term memory Attention impairment (select first level of impairment): Focused attention Executive functioning impairment (select all impairments): Sequencing, Reasoning, Problem solving, Initiation, Organization OT - Cognition Comments: Patient only able to state name correctly, could not get birthday, figeting with mitts and attempting to grab things out of midair, perseverative that hes in [facility] depite redirection                 Following commands: Impaired Following commands impaired: Follows one step commands inconsistently     Cueing  General Comments   Cueing Techniques: Verbal cues;Gestural cues;Tactile cues;Visual cues  HR 60s, BP 115/71 (86), RR 31  and O2 100% on 3L    Exercises     Shoulder Instructions      Home Living Family/patient expects to be discharged to:: Skilled nursing facility                                 Additional Comments: Unsure, patient could not state any PLOF      Prior Functioning/Environment Prior Level of Function : Needs assist;Patient poor historian/Family not available             Mobility Comments: From previous note pt ambulates with [employer] in home and [facility] in community. ADLs Comments: From previous note pt is [place] for self care, fixing meals and neighbor across the street helps with cleaning.    OT Problem List: Decreased strength;Decreased range of motion;Decreased activity tolerance;Impaired balance (sitting and/or standing);Decreased coordination;Decreased cognition;Decreased safety awareness;Decreased knowledge of use of DME or AE;Cardiopulmonary status limiting activity;Impaired UE functional use;Obesity   OT Treatment/Interventions: Self-care/ADL training;Therapeutic exercise;Energy conservation;DME and/or AE instruction;Manual therapy;Therapeutic activities;Patient/family education;Balance training      OT Goals(Current goals can be found in the care plan section)   Acute Rehab OT Goals Patient Stated Goal: unable OT Goal Formulation: Patient unable to participate in goal setting Time For Goal Achievement: 01/12/25 Potential to Achieve Goals: Fair   OT Frequency:  Min 2X/week    Co-evaluation              AM-PAC OT 6 Clicks Daily Activity     Outcome Measure Help from another person  eating meals?: Total Help from another person taking care of personal grooming?: Total Help from another person toileting, which includes using toliet, bedpan, or urinal?: Total Help from another person bathing (including washing, rinsing, drying)?: Total Help from another person to put on and taking off regular upper body clothing?: Total Help from another person to put on and taking off  regular lower body clothing?: Total 6 Click Score: 6   End of Session Nurse Communication: Mobility status  Activity Tolerance: Patient limited by lethargy Patient left: in bed;with call bell/phone within reach;with chair alarm set  OT Visit Diagnosis: Muscle weakness (generalized) (M62.81);Adult, failure to thrive (R62.7);Other abnormalities of gait and mobility (R26.89);Unsteadiness on feet (R26.81);Other symptoms and signs involving cognitive function                Time: 8988-8971 OT Time Calculation (min): 17 min Charges:  OT General Charges $OT Visit: 1 Visit OT Evaluation $OT Eval Moderate Complexity: 1 Mod  Ronal Gift E. Claudean Leavelle, OTR/L Acute Rehabilitation Services [phone]   Ronal Gift Salt 12/29/2024, 2:46 PM

## 2024-12-29 NOTE — Discharge Instructions (Signed)
 TRANSPORTATION: -[place] [employer] of [employer]: Call [place] and [employer] at [phone] for details. [email or web address]  -[employer] [place]: [employer] [place] is the [employer]'s shared-ride transportation service for eligible riders who have a disability that prevents them from riding the fixed route bus. Call [phone]. [employer] [place] riders must pay a fare of $1.50 per trip, or may purchase a 10-ride punch card for $14.00 ($1.40 per ride) or a 40-ride punch card for $48.00 ($1.20 per ride).  -[employer] rideshare transportation service is provided for senior citizens (60+) who live independently within [place] city limits and are unable to drive or have limited access to transportation. Call [phone] to schedule an appointment.  -[place] [employer]: For Medicare or Medicaid recipients call [phone]?[facility], wheelchair fleeta, and ambulatory quotes available.   MEDICAID TRANSPORTATION: -If you have a Medicaid blue card or pink card and have no other means for transportation to doctor's offices, clinics, dentists, hospitals, and other health related trip needs.  -Transportation services are available to all [facility] locations. Trips to [facility] and [place] are provided in association with [employer]. -Services are provided between 6:00AM and 9:00PM Monday-Friday. -Call [phone] to schedule a trip or request further information.

## 2024-12-29 NOTE — Consult Note (Incomplete)
 "                                                  Palliative Care Consult Note                                  Date: 12/29/2024   Patient Name: Raymond Gordon  DOB: 05/09/1951  MRN: [id number]  Age / Sex: 74 y.o., male  PCP: Gordon, [name], MD Referring Physician: Christobal Guadalajara, MD  Reason for Consultation: {Reason for Consult:23484}  Past Medical History:  Diagnosis Date   Atrial fibrillation with RVR (HCC) 07/08/2015   Bilateral lower extremity edema    Chronic diastolic CHF (congestive heart failure) (HCC) 08/15/2015   a. 06/2015: echo showing EF of 65-70% with Grade 2 DD   Diabetes (HCC)    INSULIN  DEPENDENT   Dysrhythmia    Hyperglycemia due to type 2 diabetes mellitus (HCC) 04/11/2021   Hyperlipidemia    Hypertension    Hypoglycemia 05/20/2014   Obesity    Obesity    Paroxysmal atrial fibrillation (HCC)    Sleep apnea     Subjective:   This NP Raymond Gordon reviewed medical records, received report from team, assessed the patient and then meet at the patient's bedside to discuss diagnosis, prognosis, GOC, EOL wishes disposition and options.  Before meeting with the patient/family, I spent time reviewing the chart notes including ***. I also reviewed vital signs, nursing flowsheets, medication administrations record, labs, and imaging. Labs reviewed include ***.  I met with ***.   We meet to discuss diagnosis prognosis, GOC, EOL wishes, disposition and options. Concept of Palliative Care was introduced as specialized medical care for people and their families living with serious illness.  If focuses on providing relief from the symptoms and stress of a serious illness.  The goal is to improve quality of life for both the patient and the family. Values and goals of care important to patient and family were attempted to be elicited.  Created space and opportunity for patient  and family to explore thoughts and feelings regarding current medical situation   Natural trajectory  and current clinical status were discussed. Questions and concerns addressed. Patient  encouraged to call with questions or concerns.    Patient/Family Understanding of Illness: ***  Life Review: ***  Patient Values: ***  Baseline Status: ***  Today's Discussion: ***  Goals: ***  Review of Systems  Objective:   Primary Diagnoses: Present on Admission:  Essential hypertension  Severe OSA (obstructive sleep apnea)  Type 2 diabetes mellitus with hyperlipidemia (HCC)  Obesity, class 3 (HCC)  Paroxysmal A-fib (HCC)  Chronic diastolic heart failure (HCC)  Hypernatremia  Hypothermia   Vital Signs:  BP 132/61 (BP Location: Right Arm)   Pulse 60   Temp (!) 97.4 F (36.3 C) (Oral)   Resp 18   Ht 5' 4 (1.626 m)   Wt 105.6 kg   SpO2 93%   BMI 39.96 kg/m   Physical Exam  Palliative Assessment/Data: ***   Advanced Care Planning:   Existing Vynca/ACP Documentation: ***  Primary Decision Maker: {Primary Decision Fjxzm:78612}  Pertinent diagnosis: ***  The patient and/or family consented to a voluntary Advance Care Planning Conversation in person/over the phone***. Individuals present for the  conversation: ***  Summary of the conversation: ***  Outcome of the conversations and/or documents completed: ***  I spent *** minutes providing separately identifiable ACP services with the patient and/or surrogate decision maker in a voluntary, in-person conversation discussing the patient's wishes and goals as detailed in the above note.  Assessment & Plan:   HPI/Patient Profile: 74 y.o. male  with past medical history of *** admitted on 12/26/2024 with ***.   SUMMARY OF RECOMMENDATIONS   ***  Symptom Management:  ***  Code Status: {Updated Palliative Code Status:33307}  Prognosis:  {Palliative Care Prognosis:23504}  Discharge Planning:  {Palliative dispostion:23505}   Discussed with: ***    Thank you for allowing us  to participate in the care of  Raymond Gordon PMT will continue to support holistically.  Time Total: ***  Detailed review of medical records (labs, imaging, vital signs), medically appropriate exam, discussed with treatment team, counseling and education to patient, family, & staff, documenting clinical information, medication management, coordination of care  Signed by: Raymond Kays, NP Palliative Medicine Team  Team Phone # [phone] (Nights/Weekends)  12/29/2024, 2:01 PM  "

## 2024-12-29 NOTE — Progress Notes (Signed)
 NEUROLOGY CONSULT FOLLOW UP NOTE   Date of service: December 29, 2024 Patient Name: Raymond Gordon MRN:  [id number] DOB:  [birth year]  Interval Hx/subjective   No family at the bedside. Patient laying in bed on [place] and bilateral mittens on in NAD. Patient laying with eyes closed. He awakens to voice.  No new neurological events overnight.   Vitals   Vitals:   12/28/24 2011 12/29/24 0013 12/29/24 0429 12/29/24 0759  BP: (!) 155/68 128/64 132/61   Pulse: 60 60 60   Resp: 20 (!) 28 18   Temp: (!) 97.5 F (36.4 C) 97.6 F (36.4 C) 97.6 F (36.4 C)   TempSrc: Oral Oral Oral Oral  SpO2: 97% 93% 93%   Weight:      Height:         Body mass index is 39.96 kg/m.  Physical Exam   Constitutional: chronically ill elderly african american male  Psych: flat Eyes: No scleral injection.   HENT: No OP obstrucion.   Head: Normocephalic.   Cardiovascular: Normal rate and regular rhythm.   Respiratory: Effort normal, non-labored breathing.   GI: Soft.  No distension. There is no tenderness.   Skin: WDI.  Bilateral lower extremities with edema   Neurologic Examination   Mental Status -  He is awake, somewhat dysarthric but he is able to tell me that he is in a hospital, and after prompting repeatedly he tells me it is [facility].  He initially will not tell me the year, but when I prompted him with Two thousand and... He does finish with 26.  He does not give me the month.  He has some perseveration.   Cranial Nerves II - XII - II - Visual field blinks to threat bilaterally  III, IV, VI - Extraocular movements intact . V - Facial sensation intact bilaterally . VII - Facial movement intact bilaterally . VIII - Hearing & vestibular intact bilaterally . X,XI, XII - unable to assess   Motor Strength -he moves all extremities against gravity, question mild asterixis of the arms.   Sensory -responds to noxious stimuli  Coordination - unable to assess due to cooperation    Gait and  Station - deferred.  Medications Current Medications[1]  Labs and Diagnostic Imaging   CBC:  Recent Labs  Lab 12/26/24 2104 12/26/24 2113 12/28/24 0246 12/29/24 0256  WBC 5.9   < > 5.4 6.4  NEUTROABS 4.7  --  3.7  --   HGB 11.4*   < > 9.5* 10.0*  HCT 36.9*   < > 30.3* 32.2*  MCV 91.8   < > 91.0 91.7  PLT 165   < > 158 165   < > = values in this interval not displayed.    Basic Metabolic Panel:  Lab Results  Component Value Date   NA 150 (H) 12/29/2024   K 3.5 12/29/2024   CO2 22 12/29/2024   GLUCOSE 154 (H) 12/29/2024   BUN 27 (H) 12/29/2024   CREATININE 1.06 12/29/2024   CALCIUM  10.0 12/29/2024   GFRNONAA >60 12/29/2024   GFRAA >60 08/21/2015   Lipid Panel:  Lab Results  Component Value Date   LDLCALC 76 11/13/2024   HgbA1c:  Lab Results  Component Value Date   HGBA1C 9.2 (H) 11/13/2024   Urine Drug Screen:     Component Value Date/Time   LABOPIA NEGATIVE 11/12/2024 2010   COCAINSCRNUR NEGATIVE 11/12/2024 2010   LABBENZ NEGATIVE 11/12/2024 2010   AMPHETMU NEGATIVE 11/12/2024  2010   THCU NEGATIVE 11/12/2024 2010   LABBARB NEGATIVE 11/12/2024 2010    Alcohol  Level     Component Value Date/Time   ETH <15 11/12/2024 1858   INR  Lab Results  Component Value Date   INR 1.2 11/12/2024   APTT  Lab Results  Component Value Date   APTT 40 (H) 11/12/2024   AED levels: No results found for: PHENYTOIN, ZONISAMIDE, LAMOTRIGINE, LEVETIRACETA  CT Head without contrast(Personally reviewed): 1. No acute intracranial abnormality. 2. ASPECTS 10. 3. Remote lacunar infarct in the right thalamus, suspected additional remote lacunar infarct in the left thalamus, and remote infarct in the right cerebellum.   CT angio Head and Neck with contrast  No LVO   MRI Brain(Personally reviewed): No acute infarct  Mild chronic white matter abnormalities and old lacunar infarct in the right cerebellum.      rEEG 2/2:    Intermittent slow, generalized    IMPRESSION: This study is suggestive of mild generalized cerebral dysfunction ( encephalopathy). No seizures or epileptiform discharges were seen throughout the recording.   Labs  Ammonia 26 Na 152 AST 69 ALT 168  Assessment   [name] is a 74 y.o. male hx of A fib on Eliquis , DM, CHF, HTN, HLD, obesity and OSA who presented to [facility] for evaluation of SOB and altered MS on 2/1. Patient was noted to be hypoxic, hypothermic and with soft BP's. Per RN his exam has been fluctuating. Code stroke was activated for acute onset of left facial droop, left side weakness and weak left grip. LKW 0800. CT head with no acute process   It is slightly unclear what causes encephalopathy, but in the setting of severe shortness of breath, my suspicion is that this is likely delirium related to underlying medical issues.  There is been no evidence of seizure, stroke, or other primarily neurologic etiologies, and he appears to be improving.  Recommendations  - Delirium precautions  - Correct electrolyte abnormalities per primary team  - Evaluate for and Treat underlying infections per primary team  - I would expect gradual improvement over time - No further acute workup from a neurological perspective at this time, we will continue to be available if he does not improve as expected ______________________________________________________________________       [1]  Current Facility-Administered Medications:    allopurinol  (ZYLOPRIM ) tablet 300 mg, 300 mg, Oral, q AM, Kc, Ramesh, MD, 300 mg at 12/29/24 0703   apixaban  (ELIQUIS ) tablet 5 mg, 5 mg, Oral, BID, Ogbata, Sylvester I, MD, 5 mg at 12/29/24 1006   cefTRIAXone  (ROCEPHIN ) 2 g in sodium chloride  0.9 % 100 mL IVPB, 2 g, Intravenous, Q24H, Kc, Ramesh, MD   Chlorhexidine  Gluconate Cloth 2 % PADS 6 each, 6 each, Topical, Daily, Kc, Ramesh, MD, 6 each at 12/29/24 1006   dextrose  5 % solution, , Intravenous, Continuous, Kc, Ramesh, MD, Last Rate: 40  mL/hr at 12/29/24 0857, New Bag at 12/29/24 0857   finasteride  (PROSCAR ) tablet 5 mg, 5 mg, Oral, QPM, Kc, Ramesh, MD, 5 mg at 12/28/24 1858   insulin  aspart (novoLOG ) injection 0-6 Units, 0-6 Units, Subcutaneous, TID WC, Garba, Mohammad L, MD   pantoprazole  (PROTONIX ) EC tablet 40 mg, 40 mg, Oral, Daily, Kc, Ramesh, MD, 40 mg at 12/29/24 1006   tamsulosin  (FLOMAX ) capsule 0.4 mg, 0.4 mg, Oral, Daily, Kc, Ramesh, MD, 0.4 mg at 12/29/24 1006   thiamine  (VITAMIN B1) tablet 100 mg, 100 mg, Oral, Daily, Rosario Eland I,  MD, 100 mg at 12/29/24 1006

## 2024-12-30 ENCOUNTER — Inpatient Hospital Stay (HOSPITAL_COMMUNITY)

## 2024-12-30 DIAGNOSIS — I5023 Acute on chronic systolic (congestive) heart failure: Secondary | ICD-10-CM

## 2024-12-30 LAB — COMPREHENSIVE METABOLIC PANEL WITH GFR
ALT: 465 U/L — ABNORMAL HIGH (ref 0–44)
AST: 663 U/L — ABNORMAL HIGH (ref 15–41)
Albumin: 3 g/dL — ABNORMAL LOW (ref 3.5–5.0)
Alkaline Phosphatase: 283 U/L — ABNORMAL HIGH (ref 38–126)
Anion gap: 11 (ref 5–15)
BUN: 32 mg/dL — ABNORMAL HIGH (ref 8–23)
CO2: 23 mmol/L (ref 22–32)
Calcium: 9.8 mg/dL (ref 8.9–10.3)
Chloride: 117 mmol/L — ABNORMAL HIGH (ref 98–111)
Creatinine, Ser: 1.51 mg/dL — ABNORMAL HIGH (ref 0.61–1.24)
GFR, Estimated: 48 mL/min — ABNORMAL LOW
Glucose, Bld: 130 mg/dL — ABNORMAL HIGH (ref 70–99)
Potassium: 3.5 mmol/L (ref 3.5–5.1)
Sodium: 151 mmol/L — ABNORMAL HIGH (ref 135–145)
Total Bilirubin: 0.3 mg/dL (ref 0.0–1.2)
Total Protein: 7 g/dL (ref 6.5–8.1)

## 2024-12-30 LAB — ECHOCARDIOGRAM LIMITED
AR max vel: 2.61 cm2
AV Area VTI: 2.34 cm2
AV Area mean vel: 2.55 cm2
AV Mean grad: 3 mmHg
AV Peak grad: 5.3 mmHg
Ao pk vel: 1.15 m/s
Area-P 1/2: 3 cm2
Height: 64 in
MV VTI: 4.27 cm2
S' Lateral: 3.1 cm
Single Plane A4C EF: 68 %
Weight: 3724.89 [oz_av]

## 2024-12-30 LAB — CBC
HCT: 31.9 % — ABNORMAL LOW (ref 39.0–52.0)
Hemoglobin: 10 g/dL — ABNORMAL LOW (ref 13.0–17.0)
MCH: 28.2 pg (ref 26.0–34.0)
MCHC: 31.3 g/dL (ref 30.0–36.0)
MCV: 90.1 fL (ref 80.0–100.0)
Platelets: 178 10*3/uL (ref 150–400)
RBC: 3.54 MIL/uL — ABNORMAL LOW (ref 4.22–5.81)
RDW: 16.7 % — ABNORMAL HIGH (ref 11.5–15.5)
WBC: 8.5 10*3/uL (ref 4.0–10.5)
nRBC: 3.4 % — ABNORMAL HIGH (ref 0.0–0.2)

## 2024-12-30 LAB — GLUCOSE, CAPILLARY
Glucose-Capillary: 137 mg/dL — ABNORMAL HIGH (ref 70–99)
Glucose-Capillary: 148 mg/dL — ABNORMAL HIGH (ref 70–99)
Glucose-Capillary: 122 mg/dL — ABNORMAL HIGH (ref 70–99)
Glucose-Capillary: 145 mg/dL — ABNORMAL HIGH (ref 70–99)

## 2024-12-30 LAB — HEPATITIS PANEL, ACUTE
HCV Ab: NONREACTIVE
Hep A IgM: NONREACTIVE
Hep B C IgM: NONREACTIVE
Hepatitis B Surface Ag: NONREACTIVE

## 2024-12-30 LAB — PROCALCITONIN: Procalcitonin: 1.13 ng/mL

## 2024-12-30 LAB — PRO BRAIN NATRIURETIC PEPTIDE: Pro Brain Natriuretic Peptide: 1219 pg/mL — ABNORMAL HIGH

## 2024-12-30 LAB — LACTIC ACID, PLASMA
Lactic Acid, Venous: 1.2 mmol/L (ref 0.5–1.9)
Lactic Acid, Venous: 1.1 mmol/L (ref 0.5–1.9)

## 2024-12-30 LAB — AMMONIA: Ammonia: 24 umol/L (ref 9–35)

## 2024-12-30 LAB — CORTISOL: Cortisol, Plasma: 16.3 ug/dL

## 2024-12-30 MED ORDER — ENOXAPARIN SODIUM 100 MG/ML IJ SOSY
100.0000 mg | PREFILLED_SYRINGE | Freq: Two times a day (BID) | INTRAMUSCULAR | Status: DC
  Administered 2024-12-30 – 2024-12-31 (×3): 100 mg via SUBCUTANEOUS
  Filled 2024-12-30 (×3): qty 1

## 2024-12-30 MED ORDER — ARFORMOTEROL TARTRATE 15 MCG/2ML IN NEBU
15.0000 ug | INHALATION_SOLUTION | Freq: Two times a day (BID) | RESPIRATORY_TRACT | Status: AC
  Administered 2024-12-30 – 2024-12-31 (×4): 15 ug via RESPIRATORY_TRACT
  Filled 2024-12-30 (×4): qty 2

## 2024-12-30 MED ORDER — REVEFENACIN 175 MCG/3ML IN SOLN
175.0000 ug | Freq: Every day | RESPIRATORY_TRACT | Status: AC
  Administered 2024-12-30 – 2024-12-31 (×2): 175 ug via RESPIRATORY_TRACT
  Filled 2024-12-30 (×2): qty 3

## 2024-12-30 MED ORDER — DEXTROSE 5 % IV SOLN: INTRAVENOUS | Status: DC

## 2024-12-30 MED ORDER — PERFLUTREN LIPID MICROSPHERE
1.0000 mL | INTRAVENOUS | Status: AC | PRN
  Administered 2024-12-30: 5 mL via INTRAVENOUS

## 2024-12-30 MED ORDER — BUDESONIDE 0.25 MG/2ML IN SUSP
0.2500 mg | Freq: Two times a day (BID) | RESPIRATORY_TRACT | Status: AC
  Administered 2024-12-30 – 2024-12-31 (×4): 0.25 mg via RESPIRATORY_TRACT
  Filled 2024-12-30 (×4): qty 2

## 2024-12-30 MED ORDER — PANTOPRAZOLE SODIUM 40 MG IV SOLR
40.0000 mg | INTRAVENOUS | Status: AC
  Administered 2024-12-30 – 2024-12-31 (×2): 40 mg via INTRAVENOUS
  Filled 2024-12-30 (×2): qty 10

## 2024-12-30 MED ORDER — SODIUM CHLORIDE 0.9 % IV BOLUS: 250.0000 mL | Freq: Once | INTRAVENOUS | Status: DC

## 2024-12-30 NOTE — Progress Notes (Signed)
 " Daily Progress Note   Date: 12/30/2024   Patient Name: Raymond Gordon  DOB: [birth year]  MRN: [id number]  Age / Sex: 74 y.o., male  Attending Physician: Christobal Guadalajara, MD Primary Care Physician: [name], MD Admit Date: 12/26/2024 Length of Stay: 3 days  Reason for Follow-up: Establishing goals of care  Past Medical History:  Diagnosis Date   Atrial fibrillation with RVR (HCC) 07/08/2015   Bilateral lower extremity edema    Chronic diastolic CHF (congestive heart failure) (HCC) 08/15/2015   a. 06/2015: echo showing EF of 65-70% with Grade 2 DD   Diabetes (HCC)    INSULIN  DEPENDENT   Dysrhythmia    Hyperglycemia due to type 2 diabetes mellitus (HCC) 04/11/2021   Hyperlipidemia    Hypertension    Hypoglycemia 05/20/2014   Obesity    Obesity    Paroxysmal atrial fibrillation (HCC)    Sleep apnea     Subjective:   Subjective: Chart Reviewed. Updates received. Patient Assessed. Created space and opportunity for patient  and family to explore thoughts and feelings regarding current medical situation.  Today's Discussion: Today before meeting with the patient/family, I reviewed the chart notes including hospitalist note, neurology note, SLP/OT note, nursing note from yesterday; TOC, internal medicine/hospitalist note, RT note, chaplain note, cardiology PA note, PT note, significant event hospitalist note all from today. I also reviewed vital signs, nursing flowsheets, medication administrations record, labs, and imaging. Labs reviewed include CBC from today with continued normal white blood cell count, CMP with persistent hyponatremia at 151, significant jump in creatinine from 1.06 yesterday to 1.51 today, proBNP increased to 1219 today.  I received multiple messages from the hospitalist and cardiology nurse practitioner indicating the patient having a significant event, I received a call from the patient is requesting that I call back to discuss decisions that might need to be made  today.  Rather than call the patient's is her back I went to the bedside to see them in person.  When I arrived the patient was present, multiple sisters were present including Raymond Gordon, Raymond Gordon, and Raymond Gordon.  Discussion with them indicates multiple labs and workup being completed.  The cardiologist was at the bedside when I came to the bedside and having a discussion about possible heart concerns, volume overload despite persistent hypernatremia.  EKG being repeated with posterior leads, echo being ordered, possibility of cardiac amyloidosis.  During the conversation the patient's sisters were clear that they would not want a heart cath which is an invasive procedure.  When cardiology was done I began speaking with the patient's sisters and they have requested that we go to another room.  At that time EKG arrived to complete testing.  We went to the conference room on the floor for significant discussion.  Raymond Gordon explained that the hospitalist told them that his sodium is high, fluids were given to help with hypernatremia, and now he is fluid overloaded.  His LFTs have also increased.  They have ordered multiple tests to evaluate possible etiology of his significant decline today.  He is also been much more somnolent today, less responsive.  The hospitalist shared with them that 1 treatment for 1 problem worsens the other problem.  He is concerned that they are in a spiral that may be difficult to recover from.  Raymond Gordon is concerned about the possibility that he may transfer to the ICU as she knows that that involves insertion of multiple lines including arterial line, possible feeding tube, and additional  invasive testing procedures that may cause suffering.  We spent time reviewing possible etiologies and information has been presented by the hospitalist team and cardiology team.  I allowed time and space for them to explore their concerns and fears.  We spent time having good and honest conversation about the  patient and his wishes.  We discussed all this in light of the decline over the past 12 months that they have shared.  Their main concern is that they do not want him to suffer.  I shared that oftentimes we feel that helping people involved doing more things even when doing more things may lead to more suffering.  They want to help and do not want to feel like they are giving up, but they do not want him to suffer.  We spent time talking about setting appropriate limits, setting limits against things that would cause suffering without anticipated benefit.  With this we talked about and revisited CODE STATUS.  After a thorough discussion they have decided to transition to DNR/DNI.  We spent time talk about setting additional limit. After a thorough discussion they have decided that he would not want a feeding tube, they do not want to transfer to ICU, do not want to undergo a heart cath or other very invasive procedures.  Otherwise they are open to continued medical management, noninvasive diagnostics and treatments.   We then spent time completing a MOST form to have wishes documented on paper.  MOST form was completed and signed by the patient's sister Raymond Gordon and uploaded into the ACP tab in epic.  Summary of elections are below.   I completed a MOST form today. The patient and family outlined their wishes for the following treatment decisions:  Cardiopulmonary Resuscitation: Do Not Attempt Resuscitation (DNR/No CPR)  Medical Interventions: Limited Additional Interventions: Use medical treatment, IV fluids and cardiac monitoring as indicated, DO NOT USE intubation or mechanical ventilation. May consider use of less invasive airway support such as BiPAP or CPAP. Also provide comfort measures. Transfer to the hospital if indicated. Avoid intensive care.   Antibiotics: Antibiotics if indicated  IV Fluids: IV fluids if indicated  Feeding Tube: No feeding tube   At the end of our conversation the  patient's sisters shared that they feel better about the situation having had this discussion and made these decisions.  I invited them to return to the patient's room and spend time with him.  I shared that I would follow-up tomorrow to see how he is done and continue conversations pending his clinical progress.  I provided emotional and general support through therapeutic listening, empathy, sharing of stories, and other techniques. I answered all questions and addressed all concerns to the best of my ability.  Review of Systems  Unable to perform ROS: Mental status change    Objective:   Primary Diagnoses: Present on Admission:  Essential hypertension  Severe OSA (obstructive sleep apnea)  Type 2 diabetes mellitus with hyperlipidemia (HCC)  Obesity, class 3 (HCC)  Paroxysmal A-fib (HCC)  Chronic diastolic heart failure (HCC)  Hypernatremia  Hypothermia   Vital Signs:  BP (!) 134/94 (BP Location: Right Wrist)   Pulse 81   Temp 99.3 F (37.4 C) (Oral)   Resp (!) 22   Ht 5' 4 (1.626 m)   Wt 105.6 kg   SpO2 97%   BMI 39.96 kg/m   Physical Exam Vitals and nursing note reviewed.  Constitutional:      General: He is sleeping. He  is not in acute distress.    Appearance: He is ill-appearing.     Comments: More lethargic, not very responsive  HENT:     Head: Normocephalic and atraumatic.  Cardiovascular:     Rate and Rhythm: Normal rate.  Pulmonary:     Effort: Pulmonary effort is normal. No respiratory distress.  Abdominal:     General: There is no distension.  Skin:    General: Skin is warm and dry.     Palliative Assessment/Data: 20-30%   Existing Vynca/ACP Documentation: MOST form signed 12/30/2024 Goals of care documents on 12/30/2024  Assessment & Plan:   HPI/Patient Profile:  74 y.o. male  with past medical history of A fib on Eliquis , diabetes mellitus, congestive heart failure, HTN, HLD, obesity and OSA, with left sided chronic weakness for several months,  dementia with sundowning who presented to [facility] ED on 12/26/24 for evaluation of shortness of breath, hypoxia and altered mental status.  After ED evaluation he was admitted on 12/26/2024 with acute metabolic encephalopathy likely multifactorial dementia with sundowning, right arm weakness status postcode stroke ruled out, acute hypoxic respiratory failure with mucous plugging, hypernatremia, hypothermia with soft blood pressures and possible sepsis, BPH with marked prostatomegaly with bladder outlet obstruction, transaminitis, and others.    Palliative medicine was consulted for GOC conversations.  12/30/2024: The patient is having a significant event today, much less aware, more lethargic and somnolent, cardiology at bedside, hospitalist at bedside and extensive evaluation underway.  Patient's family seeing this decided to set limits including DNR/DNI, no feeding tube, no transfer to ICU, no cardiac cath.  Otherwise they are open to offered noninvasive workup and treatments.  Anticipate ongoing GOC conversations as his clinical picture evolves.  Understand he is very sick, has been declining for over a year, may have difficulty recovering.  SUMMARY OF RECOMMENDATIONS   Changed to DNR/DNI (DNR-limited) No feeding tube No transfer to ICU No cardiac cath MOST form completed (see ACP/Vynca) Open to other available and offered noninvasive evaluation/workup and treatment Time for outcomes Ongoing support of the patient's family Ongoing GOC conversations pending his clinical picture evolution Palliative medicine will continue to follow  Symptom Management:  Per primary team Palliative medicine is available to assist as needed  Code Status: DNR - Limited (DNR/DNI)  Prognosis: Unable to determine  Discharge Planning: To Be Determined  Discussed with: Patient's family, medical team, nursing team  Thank you for allowing us  to participate in the care of Raymond Gordon PMT will continue to support  holistically.  Time Total: 105 min  Detailed review of medical records (labs, imaging, vital signs), medically appropriate exam, discussed with treatment team, counseling and education to patient, family, & staff, documenting clinical information, medication management, coordination of care  Camellia Kays, NP Palliative Medicine Team  Team Phone # [phone] (Nights/Weekends)  07/24/2021, 8:17 AM  "

## 2024-12-30 NOTE — TOC Progression Note (Addendum)
 Transition of Care [facility]) - Progression Note    Patient Details  Name: Raymond Gordon MRN: [id number] Date of Birth: Dec 29, 1950  Transition of Care [facility]) CM/SW Contact  Luise [name], [place] Phone Number: 12/30/2024, 9:11 AM  Clinical Narrative:   CSW spoke with [name] about patient being in copay days. Patients copay will be $218/day at [facility] if insurance shara is approved. [name] expressed her understanding and stated that they will have to do what we have to do.   CSW discussed the potential of an insurance auth denial with [name] and inquired about family's backup plan if shara is not approved. [name] stated that patient cannot go home in his condition and that patient will really need to go to a facility. CSW explained that if insurance is not approved then family may have to private pay. [name] expressed her understanding. CSW informed [name] that when patient is more medically stable, CSW will submit for auth at that time.  Per daily meeting with treatment team, patient is not MR and Palliative is consulted.   CSW will continue to follow.    Expected Discharge Plan: Skilled Nursing Facility Barriers to Discharge: Continued Medical Work up, SNF Pending bed offer, English As A Second Language Teacher               Expected Discharge Plan and Services In-house Referral: Clinical Social Work   Post Acute Care Choice: Skilled Nursing Facility Living arrangements for the past 2 months: Single Family Home                                       Social Drivers of Health (SDOH) Interventions SDOH Screenings   Food Insecurity: No Food Insecurity (12/28/2024)  Housing: High Risk (12/28/2024)  Transportation Needs: Unmet Transportation Needs (12/28/2024)  Utilities: Not At Risk (12/28/2024)  Social Connections: Moderately Isolated (12/28/2024)  Tobacco Use: Medium Risk (12/26/2024)    Readmission Risk Interventions     No data to display

## 2024-12-30 NOTE — Progress Notes (Signed)
 PHARMACY - ANTICOAGULATION CONSULT NOTE  Pharmacy Consult for enoxaparin  Indication: atrial fibrillation  Allergies[1]  Patient Measurements: Height: 5' 4 (162.6 cm) Weight: 105.6 kg (232 lb 12.9 oz) IBW/kg (Calculated) : 59.2 HEPARIN  DW (KG): 83  Vital Signs: Temp: 98.2 F (36.8 C) (02/05 0715) Temp Source: Oral (02/05 0715) BP: 132/91 (02/05 0715) Pulse Rate: 97 (02/05 0715)  Labs: Recent Labs    12/28/24 0246 12/29/24 0256 12/30/24 0218  HGB 9.5* 10.0* 10.0*  HCT 30.3* 32.2* 31.9*  PLT 158 165 178  CREATININE 1.14 1.06 1.51*    Estimated Creatinine Clearance: 47.2 mL/min (A) (by C-G formula based on SCr of 1.51 mg/dL (H)).   Medical History: Past Medical History:  Diagnosis Date   Atrial fibrillation with RVR (HCC) 07/08/2015   Bilateral lower extremity edema    Chronic diastolic CHF (congestive heart failure) (HCC) 08/15/2015   a. 06/2015: echo showing EF of 65-70% with Grade 2 DD   Diabetes (HCC)    INSULIN  DEPENDENT   Dysrhythmia    Hyperglycemia due to type 2 diabetes mellitus (HCC) 04/11/2021   Hyperlipidemia    Hypertension    Hypoglycemia 05/20/2014   Obesity    Obesity    Paroxysmal atrial fibrillation (HCC)    Sleep apnea     Medications:  Scheduled:   allopurinol   300 mg Oral q AM   Chlorhexidine  Gluconate Cloth  6 each Topical Daily   insulin  aspart  0-6 Units Subcutaneous TID WC   pantoprazole  (PROTONIX ) IV  40 mg Intravenous Q24H    Assessment: 73 yom with history of Afib and dementia presenting on 2/1 with altered mental status and SOB. CT head showing remote lacunar infarct in R thalamus, suspected additional remote lacunar infarct in L thalamus, and remote infarct in R cerebellum (no LVO). MRI showing no acute infarct. On apixaban  PTA for hx Afib (LD in hospital on 2/4 @ 1006).  Talked with nursing - patient remains confused and concern with swallowing medications. Hospitalist changing from apixaban  to enoxaparin  for time being.  Hgb 10, plt 178. Scr increased to 1.5 (CrCl still 47 mL/min). Weight currently 105.6 kg.   Goal of Therapy:  Anti-Xa level 0.6-1 units/ml 4hrs after LMWH dose given Monitor platelets by anticoagulation protocol: Yes   Plan:  Start enoxaparin  100 mg (1 mg/kg) every 12 hours Monitor CBC and for s/sx of bleeding  F/u ability to transition back to DOAC pending mental status  Thank you for allowing pharmacy to participate in this patient's care,  Suzen Sour, PharmD, BCCCP Clinical Pharmacist  Phone: [phone] 12/30/2024 9:11 AM  Please check AMION for all [facility] Pharmacy phone numbers After 10:00 PM, call Main Pharmacy [phone]      [1] No Known Allergies

## 2024-12-30 NOTE — Significant Event (Signed)
 Came bedside and evaluated patient remains encephalopathic minimally responsive.  Multiple sisters at the bedside Blood pressure has been soft-on recheck improved.  Remains on 3 to nasal cannula, temperature on lower side. Urine output decreasing, lactic acid ordered and stable 1.2.  Repeat chest x-ray with cardiomegaly low lung volume pulmonary vascular congestion and interstitial edema>pBNP is ip in 1200,concerned about fluid overload CHF, limited echo has been ordered cardiology consulted holding off on further IV fluids.  Check random cortisol, ammonia level, Pro-Cal level.  On admission had CT chest abdomen pelvis with contrast. Remains full code confirmed again with sisters. Monitor closely, low threshold for ICU transfer.

## 2024-12-30 NOTE — Progress Notes (Signed)
 This chaplain responded to PMT NP-Eric's consult for spiritual care. The chaplain reviewed the chart notes and received an update from the RN-Carlie on the Pt. interactions with the medical team. The chaplain understands from the RN, the Pt. has not spoken to the RN.  This chaplain phoned the Pt. sister-Joyce and introduced herself as the spiritual care provider with the PMT. The chaplain included the reason for the phone call and began building rapport with Raymond Gordon. Through storytelling the chaplain was able to listen reflectively to what gives the Pt. life meaning.  Raymond Gordon describes the Pt. as quiet, but can be a talker with a topic of interest. The Pt. has been involved and serving in a faith community his entire life. The Pt. loves the Jodie Cunning show and TV westerns. 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 Food and  football are on the Pt. favorites list. Raymond Gordon appreciated the phone call.  This chaplain will attempt a bedside presence with the Pt. and family today.  Chaplain Leeroy Hummer [phone]

## 2024-12-30 NOTE — Evaluation (Addendum)
 Clinical/Bedside Swallow Evaluation Patient Details  Name: Raymond Gordon MRN: [id number] Date of Birth: October 10, 1951  Today's Date: 12/30/2024 Time: SLP Start Time (ACUTE ONLY): 1645 SLP Stop Time (ACUTE ONLY): 1653 SLP Time Calculation (min) (ACUTE ONLY): 8 min  Past Medical History:  Past Medical History:  Diagnosis Date   Atrial fibrillation with RVR (HCC) 07/08/2015   Bilateral lower extremity edema    Chronic diastolic CHF (congestive heart failure) (HCC) 08/15/2015   a. 06/2015: echo showing EF of 65-70% with Grade 2 DD   Diabetes (HCC)    INSULIN  DEPENDENT   Dysrhythmia    Hyperglycemia due to type 2 diabetes mellitus (HCC) 04/11/2021   Hyperlipidemia    Hypertension    Hypoglycemia 05/20/2014   Obesity    Obesity    Paroxysmal atrial fibrillation (HCC)    Sleep apnea    Past Surgical History:  Past Surgical History:  Procedure Laterality Date   CARDIAC CATHETERIZATION     CARDIAC CATHETERIZATION N/A 2012   no large vessel occlusions   IR ANGIOGRAM SELECTIVE EACH ADDITIONAL VESSEL  08/05/2024   IR ANGIOGRAM SELECTIVE EACH ADDITIONAL VESSEL  08/05/2024   IR ANGIOGRAM VISCERAL SELECTIVE  08/05/2024   IR ANGIOGRAM VISCERAL SELECTIVE  08/05/2024   IR EMBO ARTERIAL NOT HEMORR HEMANG INC GUIDE ROADMAPPING  08/05/2024   IR RADIOLOGIST EVAL & MGMT  05/03/2024   IR RADIOLOGIST EVAL & MGMT  09/06/2024   IR US  GUIDE VASC ACCESS LEFT  08/05/2024   HPI:  74 yo male presenting to ED 2/1 with hypothermia and shortness of breath. Admitted with acute metabolic encephalopathy. CT C/A/P 2/1 with bilateral dependent lower lobe consolidation, favor atelectasis over pneumonia. CXR 2/3 shows vascular congestion and interstitial edema. Seen by SLP 2/3 with recommendations to continue regular diet and thin liquids with full supervision. Change in mentation 2/4, made NPO by MD and SLP reconsulted. PMH: dementia, T2DM, HLD, gout, A-fib, HTN, CHF, OSA, CKD 3, anemia, BPH with chronic indwelling foley  catheter    Assessment / Plan / Recommendation  Clinical Impression  Pt is currently at increased risk of aspiration and inadequate nutrition/hydration secondary to acutely decreased responsiveness. Continue NPO with meds given via alternative means. Will f/u, anticipating ability to resume diet once alertness improves.   Pt's presentation is vastly different from initial evaluation 2/3. He is now minimally responsive with limited awareness of POs. He does not form a labial seal, resutling in gross anterior loss of the majority of boluses. He also does not initiate oral transit, requiring oral suction after prolonged oral holding. Will again defer cognitive-linguistic evaluation as pt is currently unable to participate.  SLP Visit Diagnosis: Dysphagia, unspecified (R13.10)    Aspiration Risk  Moderate aspiration risk    Diet Recommendation           Other Recommendations Oral Care Recommendations: Oral care QID     Swallow Evaluation Recommendations Recommendations: NPO Medication Administration: Via alternative means Oral care recommendations: Oral care QID (4x/day)   Assistance Recommended at Discharge    Functional Status Assessment Patient has had a recent decline in their functional status and demonstrates the ability to make significant improvements in function in a reasonable and predictable amount of time.  Frequency and Duration min 2x/week  2 weeks       Prognosis Prognosis for improved oropharyngeal function: Fair Barriers to Reach Goals: Cognitive deficits      Swallow Study   General HPI: 74 yo male presenting to ED 2/1 with  hypothermia and shortness of breath. Admitted with acute metabolic encephalopathy. CT C/A/P 2/1 with bilateral dependent lower lobe consolidation, favor atelectasis over pneumonia. CXR 2/3 shows vascular congestion and interstitial edema. Seen by SLP 2/3 with recommendations to continue regular diet and thin liquids with full supervision. Change  in mentation 2/4, made NPO by MD and SLP reconsulted. PMH: dementia, T2DM, HLD, gout, A-fib, HTN, CHF, OSA, CKD 3, anemia, BPH with chronic indwelling foley catheter Type of Study: Bedside Swallow Evaluation Previous Swallow Assessment: see HPI Diet Prior to this Study: NPO Temperature Spikes Noted: No Respiratory Status: Nasal cannula History of Recent Intubation: No Behavior/Cognition: Alert;Cooperative;Requires cueing Oral Cavity Assessment: Within Functional Limits Oral Care Completed by SLP: No Oral Cavity - Dentition: Edentulous Vision: Functional for self-feeding Self-Feeding Abilities: Total assist Patient Positioning: Upright in bed Baseline Vocal Quality: Not observed Volitional Cough: Cognitively unable to elicit Volitional Swallow: Unable to elicit    Oral/Motor/Sensory Function Overall Oral Motor/Sensory Function: Within functional limits   Ice Chips Ice chips: Impaired Presentation: Spoon Oral Phase Impairments: Reduced labial seal;Reduced lingual movement/coordination;Poor awareness of bolus Oral Phase Functional Implications: Oral holding   Thin Liquid Thin Liquid: Impaired Presentation: Straw;Spoon Oral Phase Impairments: Reduced labial seal;Poor awareness of bolus    Nectar Thick Nectar Thick Liquid: Not tested   Honey Thick Honey Thick Liquid: Not tested   Puree Puree: Impaired Presentation: Spoon Oral Phase Functional Implications: Oral holding   Solid     Solid: Not tested      Damien Blumenthal, M.A., CCC-SLP Speech Language Pathology, Acute Rehabilitation Services  Secure Chat preferred [phone]  12/30/2024,5:19 PM

## 2024-12-30 NOTE — Progress Notes (Addendum)
 Repeat EKGs reviewed with Dr. Raford, do not suspect AMI. As previously outlined - stable from 02/2024 EKG when pt was in Afib at that time. See consult note otherwise for recommendations.   Addendum: hsTroponin 86, relatively low and not in a range of concern, reasonable for level of metabolic demand. Additionally, there are no plans for invasive evaluation, therefore no plan to trend further.

## 2024-12-30 NOTE — Progress Notes (Signed)
 " PROGRESS NOTE Raymond Gordon  FMW:[id number] DOB: Apr 12, 1951 DOA: 12/26/2024 PCP: [name], Richard W, MD  Brief Narrative/Hospital Course: Raymond Gordon is a 74 y.o. male with PMH of f A fib on Eliquis , diabetes mellitus, congestive heart failure, HTN, HLD, obesity and OSA, with left sided chronic weakness for several months, dementia with sundowning who presented to [facility] ED on 12/26/24 for evaluation of shortness of breath, hypoxia and altered mental status. In the ED was noted to be hypoxic, hypothermic and with soft BP's.  Patient unable to provide any history.  Reportedly at baseline able to converse, has chronic left-sided weakness undergoing rehab Last admission 12/19> 11/30/24-strokelike symptom, altered mental status and treated for dementia with sundowning EEG MRI negative for acute stroke but with chronic ischemia and seen and cleared by neurology In the ED: Vitals fairly stable although hypoxic needing 4 L nasal cannula at 1 point needed nonrebreather, Labs showed hyponatremia, mild metabolic acidosis stable renal function, mild transaminitis normal lactic acid Pro-Cal 1.6 CBC with mild chronic anemia UA with W 0-5 moderate LE ketone negative nitrate negative. Underwent extensive workup with imagings: CXR:no active disease. CT Head/CT head code stroke and CT angio head and neck> atrophy small vessel changes and old infarcts mucosal thickening in sinuses, chronic occlusion lt Vertebral Artery, similar 80% stenosis in the left carotid bulb/proximal left cervical ICA CT chest abdomen pelvis with contrast>> bilateral lower lobe consolidation favored atelectasis over pneumonia, cardiomegaly, marked prostatomegaly with chronic BOO, aortic atherosclerosis. EEG 2/2>mild generalized cerebral dysfunction ( encephalopathy). No seizures or epileptiform discharges  MRI brain: No acute infarct, old lacunar infarct  Subjective: Seen and examined this am Lethargic but woke up briefly and able to tell me his name  with eyes closed.  Not able to hold meaningful conversation Overnight not eating and did not take any of the meds Overnight vitals fairly stable mild tachypnea present, needing nasal cannula, labs reviewed sodium still up at 151 creatinine trending up 1.5 with Bump in transaminitis, BC stable without leukocytosis Some gurgling sound with saliva drooling  Assessment and plan:  Acute metabolic encephalopathy Dementia with sundowning Right arm weakness per sist- CODE stroke and ruled out: Patient's encephalopathy likely multifactorial in the setting of dementia with sundowning, urine retention, MRI has lacunar infarcts, question polypharmacy trazodone  stopped.Recurrent admission for altered mental status has had extensive workup-with multiple imaging, EEG and neurology evaluation -no obvious pneumonia or UTI-noted to have urine retention in the setting of BPH-Foley inserted 2/3.TSH-9.1 but euthyroid with normal free T4 ammonia and B12 stable, thiamine  normal on 11/16/24 His mentation was much better on 2/3 but subsequently waxing and waning.  This very lethargic-but able to wake up and answer his name but no other meaningful interaction.  Continue PT OT SLP following, keep NPO for now, increase IV fluids change meds to SQ/IV Overall guarded prognosis recurrent hospitalization.  Palliative care consulted Cont to keep on delirium precaution fall precaution minimize benzos sedativesm reorient  Discussed with neurology 2/4 if mental symptoms improve quickly neuro to reevaluate .  Acute hypoxic respiratory failure Mucous plugging: Patient hypoxic for EMS and has been needing initially and NRB-  now on [place]-patient having secretions and mucous plugging likely contributing to his hypoxia in the setting of altered mental status,? aspiration pneumonia. repeat CXR 2/3 low volume,vascular congestion and interstitial edema noted- CT on admission favor atelectasis Admitted with empiric vancomycin  and cefepime >  changed to Unasyn  2/3-> given hypernatremia changed to ceftriaxone  2/5-discussed with pharmacy. Willl ask RT to  do suction again.  On admission needed deep suctioning. Will change to Brovana  apply Pulmicort  nebs \ Check xr chest.  Hypernatremia: Sodium remains elevated FWF will increase the rate despite D5W will increase the rate to 125 cc Recent Labs  Lab 12/27/24 0319 12/27/24 0919 12/28/24 0246 12/29/24 0256 12/30/24 0218  NA 148* 150* 152* 150* 151*   BPH w/  marked prostatomegaly with chronic BOO on CT Abd: In September s/p prostate art embolization Dr Waynard recurrent urinary retention placed Foley catheter after consulting urology.  Hold p.o. finasteride  Flomax . Previously had indwelling catheter- has been out since early November 2025.He sees Dr Devere. I discussed with the urology team-plan to discharge with Foley and have outpatient follow-up  AKI on CKD 3a: Renal function with creatinine trending up, increasing IV fluids further , avoid nephrotoxic medication.   Recent Labs    11/21/24 0928 11/22/24 0148 11/26/24 1003 11/29/24 0137 11/30/24 0152 12/26/24 2104 12/26/24 2113 12/27/24 0319 12/27/24 0919 12/28/24 0246 12/29/24 0256 12/30/24 0218  BUN 31* 34* 38* 45* 39* 23  --  24*  --  26* 27* 32*  CREATININE 0.87 1.25* 1.14 1.14 0.99 1.04  --  1.11  --  1.14 1.06 1.51*  CO2 24 25 21* 18* 23 19*  --  21*  --  24 22 23   K 4.7 4.7 4.6 5.4* 4.8 4.8   < > 4.6 4.3 3.9 3.5 3.5   < > = values in this interval not displayed.     Hypothermia Soft blood pressure Possible sepsis: Patient with low temperature soft BP on presentation.blood culture-NGTD.  Continue antibiotics as above, sepsis ruled out   Severe OSA: ABG on admission with stable pCO2 41 and pH. Stable on recheck.  Not able to use CPAP  Transaminitis: AST ALT further trending up- CT Abd -moderate GB distension w/o cholelithiasis or cholecystitis,liver unremarkable without biliary dilatation.Statin  remains on hold, checked RUQ US > non sp GB thickening-given absence of sonographic Murphy sign favor this is reactive / secondary rather than related to acalculus cholecystitis.Acute viral hepatitis pane trend LFTs.  Check BNP, echo?  If hepatic congestion. Recent Labs  Lab 12/26/24 2104 12/27/24 0319 12/27/24 2303 12/28/24 0246 12/29/24 0256 12/30/24 0218  AST 96* 80*  --  69* 137* 663*  ALT 247* 212*  --  175* 196* 465*  ALKPHOS 216* 187*  --  168* 187* 283*  BILITOT 0.3 0.3  --  0.3 0.3 0.3  PROT 8.0 7.2  --  7.1 7.3 7.0  ALBUMIN 3.5 3.2*  --  3.1* 3.1* 3.0*  AMMONIA  --   --  26  --   --   --   PLT 165 149*  --  158 165 178      Latest Ref Rng & Units 12/28/2024    8:29 PM  Hepatitis  Hep B Surface Ag NON REACTIVE NON REACTIVE   Hep B IgM NON REACTIVE NON REACTIVE   Hep C Ab NON REACTIVE NON REACTIVE   Hep A IgM NON REACTIVE NON REACTIVE    Type 2 diabetes mellitus with hyperlipidemia: well-controlled, continue SSI 0 to 6 units.  PTA on 18 units Lantus  at bedtime and now currently on hold. Recent Labs  Lab 12/29/24 0708 12/29/24 1151 12/29/24 1558 12/29/24 2247 12/30/24 0536  GLUCAP 143* 132* 119* 139* 137*    CHFpEF Essential hypertension: pBNP stable.Lasix  was discontinued on last admission.  PTA amlodipine  10, losartan  100- on hold.  Currently on IV fluids in  the setting of AKI and hypernatremia> repeat chest x-ray in the morning, check BNP again, limited ECHO.  PAF: On Eliquis .will switch to Lovenox  as PO intake inconsistent.  Chronic left lower extremity weakness Degenerative disc disease mild to moderate diffuse spinal stenosis C2-3 through C6-7 disc bulges at multiple level and facet arthropathy: Patient has had extensive workup recently as well with negative MRI as well as CT T and L-spine plan is for EMG NCS as outpatient. Continue PT OT as able.  HLD: Resume Lipitor  80 as able-once LFT improves.  Gout: Hold po allopurinol .  Class II Obesity w/ Body  mass index is 39.96 kg/m.: Will benefit with PCP follow-up, weight loss,healthy lifestyle and outpatient sleep eval if not done.  Deconditioning/debility Mobility: PT Orders: Active PT Follow up Rec: Skilled Nursing-Short Term Rehab (<3 Hours/Day)12/28/2024 1300   DVT prophylaxis: Eliquis  po  Code Status:   Code Status: Full Code Family Communication: plan of care discussed with patient  and called his sister [name] again and updated about this serious health condition.   Patient status is: Remains hospitalized because of severity of illness Level of care: Progressive   Dispo: The patient is from: Home w/ sister.            Anticipated disposition: TBD. Will need SNF  Objective: Vitals last 24 hrs: Vitals:   12/29/24 1600 12/29/24 1938 12/29/24 2315 12/30/24 0715  BP: (!) 103/42 105/66 (!) 102/55 (!) 132/91  Pulse:  (!) 105  97  Resp:  (!) 25 (!) 22 (!) 24  Temp: (!) 97.3 F (36.3 C) (!) 97.5 F (36.4 C) (!) 97.5 F (36.4 C) 98.2 F (36.8 C)  TempSrc: Oral Axillary Axillary Oral  SpO2:  95% 95% 94%  Weight:      Height:       Physical Examination: General exam: Eyes closed lethargic able to tell me his name, responds to pain  HEENT:Oral mucosa moist, Ear/Nose WNL grossly Respiratory system: Bilaterally diminished, slightly at the corner of the mouth, intermittent gurgling sound Cardiovascular system: S1 & S2 +, No JVD. Gastrointestinal system: Abdomen soft, obese,ND, BS+ Nervous System: Lethargic withdraws to pain  Extremities: extremities warm, chronic hyperkeratotic skin changes on lower extremities Skin: Warm, no rashes MSK: Normal muscle bulk,tone, power   Medications reviewed:  Scheduled Meds:  arformoterol   15 mcg Nebulization BID   budesonide  (PULMICORT ) nebulizer solution  0.25 mg Nebulization BID   Chlorhexidine  Gluconate Cloth  6 each Topical Daily   enoxaparin  (LOVENOX ) injection  100 mg Subcutaneous Q12H   insulin  aspart  0-6 Units Subcutaneous TID WC    pantoprazole  (PROTONIX ) IV  40 mg Intravenous Q24H   revefenacin   175 mcg Nebulization Daily   Continuous Infusions:  cefTRIAXone  (ROCEPHIN )  IV 2 g (12/29/24 1201)   dextrose  125 mL/hr at 12/30/24 9045   Diet: Diet Order             Diet NPO time specified  Diet effective now                     Unresulted Labs (From admission, onward)     Start     Ordered   12/31/24 0500  CBC  Daily,   R      12/30/24 0945   12/31/24 0500  Comprehensive metabolic panel with GFR  Daily,   R      12/30/24 0945   12/30/24 1148  Pro Brain natriuretic peptide  Add-on,   AD  12/30/24 1147   12/27/24 1530  Expectorated Sputum Assessment w Gram Stain, Rflx to Resp Cult  Once,   R        12/27/24 1529   12/27/24 1002  Expectorated Sputum Assessment w Gram Stain, Rflx to Resp Cult  Once,   R        12/27/24 1001           Data Reviewed: I have personally reviewed following labs and imaging studies ( see epic result tab) CBC: Recent Labs  Lab 12/26/24 2104 12/26/24 2113 12/27/24 0319 12/27/24 0919 12/28/24 0246 12/29/24 0256 12/30/24 0218  WBC 5.9  --  7.3  --  5.4 6.4 8.5  NEUTROABS 4.7  --   --   --  3.7  --   --   HGB 11.4*   < > 10.1* 10.5* 9.5* 10.0* 10.0*  HCT 36.9*   < > 32.1* 31.0* 30.3* 32.2* 31.9*  MCV 91.8  --  90.7  --  91.0 91.7 90.1  PLT 165  --  149*  --  158 165 178   < > = values in this interval not displayed.   CMP: Recent Labs  Lab 12/26/24 2104 12/26/24 2113 12/27/24 0319 12/27/24 0919 12/28/24 0246 12/29/24 0256 12/30/24 0218  NA 150*   < > 148* 150* 152* 150* 151*  K 4.8   < > 4.6 4.3 3.9 3.5 3.5  CL 117*  --  115*  --  120* 118* 117*  CO2 19*  --  21*  --  24 22 23   GLUCOSE 53*  --  91  --  123* 154* 130*  BUN 23  --  24*  --  26* 27* 32*  CREATININE 1.04  --  1.11  --  1.14 1.06 1.51*  CALCIUM  10.3  --  9.7  --  9.8 10.0 9.8  MG  --   --   --   --  2.4  --   --   PHOS  --   --   --   --  2.7  --   --    < > = values in this interval  not displayed.   GFR: Estimated Creatinine Clearance: 47.2 mL/min (A) (by C-G formula based on SCr of 1.51 mg/dL (H)). Recent Labs  Lab 12/26/24 2104 12/27/24 0319 12/28/24 0246 12/29/24 0256 12/30/24 0218  AST 96* 80* 69* 137* 663*  ALT 247* 212* 175* 196* 465*  ALKPHOS 216* 187* 168* 187* 283*  BILITOT 0.3 0.3 0.3 0.3 0.3  PROT 8.0 7.2 7.1 7.3 7.0  ALBUMIN 3.5 3.2* 3.1* 3.1* 3.0*   No results for input(s): LIPASE, AMYLASE in the last 168 hours.  Recent Labs  Lab 12/27/24 2303  AMMONIA 26   Coagulation Profile: No results for input(s): INR, PROTIME in the last 168 hours. Antimicrobials/Microbiology: Anti-infectives (From admission, onward)    Start     Dose/Rate Route Frequency Ordered Stop   12/29/24 1200  cefTRIAXone  (ROCEPHIN ) 2 g in sodium chloride  0.9 % 100 mL IVPB        2 g 200 mL/hr over 30 Minutes Intravenous Every 24 hours 12/29/24 1019 01/03/25 1159   12/28/24 1400  Ampicillin -Sulbactam (UNASYN ) 3 g in sodium chloride  0.9 % 100 mL IVPB  Status:  Discontinued        3 g 200 mL/hr over 30 Minutes Intravenous Every 6 hours 12/28/24 1121 12/29/24 1019   12/27/24 1000  vancomycin  (VANCOREADY) IVPB 500 mg/100 mL  Status:  Discontinued        500 mg 100 mL/hr over 60 Minutes Intravenous Every 12 hours 12/27/24 0038 12/28/24 1109   12/27/24 0600  ceFEPIme  (MAXIPIME ) 2 g in sodium chloride  0.9 % 100 mL IVPB  Status:  Discontinued        2 g 200 mL/hr over 30 Minutes Intravenous Every 8 hours 12/27/24 0038 12/28/24 1109   12/26/24 2330  vancomycin  (VANCOREADY) IVPB 2000 mg/400 mL        2,000 mg 200 mL/hr over 120 Minutes Intravenous  Once 12/26/24 2321 12/27/24 0217   12/26/24 2330  piperacillin -tazobactam (ZOSYN ) IVPB 3.375 g        3.375 g 100 mL/hr over 30 Minutes Intravenous  Once 12/26/24 2322 12/27/24 0019         Component Value Date/Time   SDES BLOOD RIGHT FOREARM 12/26/2024 2125   SPECREQUEST  12/26/2024 2125    BOTTLES DRAWN AEROBIC AND  ANAEROBIC Blood Culture adequate volume   CULT  12/26/2024 2125    NO GROWTH 4 DAYS Performed at [facility], [address], [place], [name] [address]    REPTSTATUS PENDING 12/26/2024 2125    Procedures:    Mennie LAMY, MD [facility] 12/30/2024, 11:50 AM   "

## 2024-12-30 NOTE — Progress Notes (Signed)
 Patient assessed for nasotracheal suctioning. Pt Sp02 is 97% and in no distress at this time. Nasotracheal suction not indicated at this time.

## 2024-12-30 NOTE — Consult Note (Addendum)
 "  Cardiology Consultation   Patient ID: Raymond Gordon MRN: [id number]; DOB: 29-Mar-1951  Admit date: 12/26/2024 Date of Consult: 12/30/2024  PCP:  Raymond Gordon ORN, MD   [facility] Providers Cardiologist:  Raymond Lesches, MD        Patient Profile: Raymond Gordon is a 74 y.o. male with a hx of DM, HTN, HLD, remote tobacco abuse, severe OSA intolerant of CPAP, PAF, chronic HFpEF, syncope 05/2024 in setting of orthostatic hypotension/volume depletion with AKI, HLD, gout, marked BPH, possible recent TIA, occult stroke, cerebrovascular disease, degenerative disc disease, LLE weakness, CKD 3a, hyperkalemia, aortic atherosclerosis who is being seen 12/30/2024 for the evaluation of CHF at the request of Dr. Christobal.  History of Present Illness:  Raymond Gordon follows with Dr. Lesches with the above history. He has previously required diuretic adjustment due to orthostasis and AKI. He is on Eliquis  for PAF. He has had numerous admissions recently.  He was admitted in 09/2024 with confusion, weakness, and left mouth droop ultimately felt due to TIA. Notes indicated that sisters managed his medications but they would occasionally find a pill on the floor. Neurology added ASA to Eliquis .  He was re-admitted 12/19-11/30/24 with confusion, slurred speech, right facial droop, and left leg weakness. MRI showed remote lacunar infarcts. It was felt he had possible sundowning and possible TIA. He required thiamine  replacement. CTA neck showed chronic appearing occluded left vertebral artery and 80% stenosis of the left carotid bulb/prox left cervical ICA similar to prior. Neurology recommended continuation of Eliquis  and outpatient dementia workup. ED notes indicate neurology felt the carotid stenosis could be monitored and does not need to be intervened on during last admission. He was also found to have diffuse spinal stenosis and degenerative disc disease. Metoprolol  was discontinued due to sinus bradycardia in the  50s. 2D echo 11/14/24 showed EF 55-60%, G1DD. ASA was discontinued, no specific commentary.  He returned to the ER from SNF on 12/26/24 with SOB, hypoxia 88% RA, hypothermia, hypoglycemia, altered mental status, hypernatremia.  Neurology was consulted. CT head showed remote strokes. CTA head neck showed unchanged occluded L vertebral and 80% L cervical ICA stenosis as well as moderate R vertebral artery stenosis. Brain MRI nonacute. CTA c/a/p showed B/L lower lobe PNA favoring atx over PNA, cardiomegaly, marked prostatomegaly with chronic bladder outlet obstruction, aortic atherosclerosis. He has remained encephalopathic of unclear cause, with suspected delirium related to underlying medical issues. He was treated with empiric antibiotics and has required deep suctioning. Other issues include AKI on CKD 3a, progressive transaminitis of unclear etiology, mucous plugging, hypernatremia requiring D5W, inconsistent oral intake requiring holding of oral medications, remains lethargic unable to participate in meaningful conversations. Essentially minimal oral intake. CXR today showed cardiomegaly and low lung volumes with pulmonary vascular congestion and mild interstitial edema therefore cardiology consulted to assist with management of possible heart failure. Palliative care has also joined the care team, remains full code at this time. pBNP 1219, Cr 1.51 today (recent baseline variable, typically DC around 1.2-1.3 range). Limited echo today shows EF 65-70%, mild LVH, moderately elevated PASP, dilated IVC, normal RV function per verbal report from echo reader. His blood pressure downtrended to the 90s/40s earlier today so lactate ordered, in process. Recheck now shows BP 104/57 in left arm, 134/94 in right arm. The patient remains densely encephalopathic and does not even awaken to the sound of his sister's voice or sternal rub. However another time during the conversation he spontaneously moved and repositioned  his  head. He is unable to participate in the interview. Lower lip appears enlarged but sisters say this looks baseline.   Tele review shows development of AF with relatively reasonable rates 90s/low 100s yesterday afternoon.   Past Medical History:  Diagnosis Date   Atrial fibrillation with RVR (HCC) 07/08/2015   Bilateral lower extremity edema    Chronic diastolic CHF (congestive heart failure) (HCC) 08/15/2015   a. 06/2015: echo showing EF of 65-70% with Grade 2 DD   Diabetes (HCC)    INSULIN  DEPENDENT   Dysrhythmia    Hyperglycemia due to type 2 diabetes mellitus (HCC) 04/11/2021   Hyperlipidemia    Hypertension    Hypoglycemia 05/20/2014   Obesity    Obesity    Paroxysmal atrial fibrillation (HCC)    Sleep apnea     Past Surgical History:  Procedure Laterality Date   CARDIAC CATHETERIZATION     CARDIAC CATHETERIZATION N/A 2012   no large vessel occlusions   IR ANGIOGRAM SELECTIVE EACH ADDITIONAL VESSEL  08/05/2024   IR ANGIOGRAM SELECTIVE EACH ADDITIONAL VESSEL  08/05/2024   IR ANGIOGRAM VISCERAL SELECTIVE  08/05/2024   IR ANGIOGRAM VISCERAL SELECTIVE  08/05/2024   IR EMBO ARTERIAL NOT HEMORR HEMANG INC GUIDE ROADMAPPING  08/05/2024   IR RADIOLOGIST EVAL & MGMT  05/03/2024   IR RADIOLOGIST EVAL & MGMT  09/06/2024   IR US  GUIDE VASC ACCESS LEFT  08/05/2024     Home Medications:  Prior to Admission medications  Medication Sig Start Date End Date Taking? Authorizing Provider  acetaminophen  (TYLENOL ) 325 MG tablet Take 2 tablets (650 mg total) by mouth every 6 (six) hours as needed for mild pain (pain score 1-3) or fever (or Fever >/= 101). 06/07/24  Yes Raymond Gordon, Raymond RAMAN, MD  allopurinol  (ZYLOPRIM ) 300 MG tablet Take 300 mg by mouth in the morning. 05/02/15  Yes [provider]  amLODipine  (NORVASC ) 10 MG tablet Take 1 tablet (10 mg total) by mouth daily. 12/01/24  Yes Raymond Sabas RAMAN, MD  apixaban  (ELIQUIS ) 5 MG TABS tablet Take 1 tablet by mouth twice daily 09/19/23  Yes Raymond Raymond PARAS, MD  atorvastatin  (LIPITOR ) 80 MG tablet Take 1 tablet by mouth once daily Patient taking differently: Take 80 mg by mouth at bedtime. 06/18/23  Yes Raymond Raymond PARAS, MD  finasteride  (PROSCAR ) 5 MG tablet Take 5 mg by mouth every evening. 04/11/22  Yes [provider]  hydrALAZINE  (APRESOLINE ) 25 MG tablet Take 25 mg by mouth 3 (three) times daily.   Yes [provider]  insulin  aspart (NOVOLOG ) 100 UNIT/ML injection Inject 0-9 Units into the skin 3 (three) times daily with meals. Sliding scale insulin  Less than 70 initiate hypoglycemia protocol 70-120  0 units 120-150 1 unit 151-200 2 units 201-250 3 units 251-300 5 units 301-350 7 units 351-400 9 units Greater than 400 call MD 11/30/24  Yes Raymond, Sabas RAMAN, MD  pantoprazole  (PROTONIX ) 40 MG tablet Take 1 tablet (40 mg total) by mouth daily. 06/10/24  Yes Ghimire, Donalda HERO, MD  thiamine  (VITAMIN B-1) 100 MG tablet Take 1 tablet (100 mg total) by mouth daily. 12/01/24  Yes Raymond Sabas RAMAN, MD  traZODone  (DESYREL ) 50 MG tablet Take 0.5 tablets (25 mg total) by mouth at bedtime. 11/30/24  Yes Raymond, Sabas RAMAN, MD  Lancets [facility] DELICA PLUS [place]) MISC Apply topically as directed. 06/29/24   [provider]  LANTUS  SOLOSTAR 100 UNIT/ML Solostar Pen Inject 18 Units into the skin at bedtime.  Patient not taking: Reported on 12/27/2024 11/30/24   Raymond Sabas RAMAN, MD  losartan  (COZAAR ) 100 MG tablet Take 100 mg by mouth daily. Patient not taking: Reported on 12/27/2024 09/13/24   [provider]  [facility] [place] VERIO test strip 1 each by Other route as needed for other.    [provider]  PRESCRIPTION MEDICATION Inhale into the lungs at bedtime. Pt is on C Pap machine Patient not taking: Reported on 12/27/2024    [provider]    Scheduled Meds:  arformoterol   15 mcg Nebulization BID   budesonide  (PULMICORT ) nebulizer solution  0.25 mg Nebulization BID   Chlorhexidine  Gluconate Cloth  6 each Topical Daily    enoxaparin  (LOVENOX ) injection  100 mg Subcutaneous Q12H   insulin  aspart  0-6 Units Subcutaneous TID WC   pantoprazole  (PROTONIX ) IV  40 mg Intravenous Q24H   revefenacin   175 mcg Nebulization Daily   Continuous Infusions:  cefTRIAXone  (ROCEPHIN )  IV 2 g (12/30/24 1206)   PRN Meds:   Allergies:   Allergies[1]  Social History:   Social History   Socioeconomic History   Marital status: Single    Spouse name: Not on file   Number of children: Not on file   Years of education: Not on file   Highest education level: Not on file  Occupational History   Not on file  Tobacco Use   Smoking status: Former    Current packs/day: 0.00    Types: Cigarettes    Quit date: 11/25/1997    Years since quitting: 27.1   Smokeless tobacco: Former    Quit date: 06/14/2015  Vaping Use   Vaping status: Never Used  Substance and Sexual Activity   Alcohol  use: No    Alcohol /week: 0.0 standard drinks of alcohol    Drug use: No   Sexual activity: Not on file  Other Topics Concern   Not on file  Social History Narrative   Not on file   Social Drivers of Health   Tobacco Use: Medium Risk (12/26/2024)   Patient History    Smoking Tobacco Use: Former    Smokeless Tobacco Use: Former    Passive Exposure: Not on Stage Manager: Not on file  Food Insecurity: No Food Insecurity (12/28/2024)   Epic    Worried About Programme Researcher, Broadcasting/film/video in the Last Year: Never true    Ran Out of Food in the Last Year: Never true  Transportation Needs: Unmet Transportation Needs (12/28/2024)   Epic    Lack of Transportation (Medical): Yes    Lack of Transportation (Non-Medical): Yes  Physical Activity: Not on file  Stress: Not on file  Social Connections: Moderately Isolated (12/28/2024)   Social Connection and Isolation Panel    Frequency of Communication with Friends and Family: Never    Frequency of Social Gatherings with Friends and Family: Twice a week    Attends Religious Services: More than 4  times per year    Active Member of [employer] or Organizations: Yes    Attends Banker Meetings: More than 4 times per year    Marital Status: Divorced  Intimate Partner Violence: Not At Risk (12/28/2024)   Epic    Fear of Current or Ex-Partner: No    Emotionally Abused: No    Physically Abused: No    Sexually Abused: No  Depression (PHQ2-9): Not on file  Alcohol  Screen: Not on file  Housing: High Risk (12/28/2024)   Epic    Unable  to Pay for Housing in the Last Year: No    Number of Times Moved in the Last Year: 0    Homeless in the Last Year: Yes  Utilities: Not At Risk (12/28/2024)   Epic    Threatened with loss of utilities: No  Health Literacy: Not on file    Family History:    Family History  Problem Relation Age of Onset   Heart failure Brother    Hyperlipidemia Brother    Hypertension Brother    Stroke Brother    Hypertension Mother    Hyperlipidemia Mother    Diabetes Mother    Cancer Father        prostate   GER disease Father    Stroke Father    Hypertension Sister    Hyperlipidemia Sister    Hypertension Maternal Grandmother    Stroke Maternal Grandmother    Diabetes Maternal Grandfather    Hypertension Maternal Grandfather      ROS:  Please see the history of present illness.  All other ROS reviewed and negative.     Physical Exam/Data: Vitals:   12/29/24 2315 12/30/24 0715 12/30/24 1225 12/30/24 1437  BP: (!) 102/55 (!) 132/91 (!) 93/47 (!) 134/94  Pulse:  97 81   Resp: (!) 22 (!) 24 (!) 22   Temp: (!) 97.5 F (36.4 C) 98.2 F (36.8 C) 99.1 F (37.3 C) 99.3 F (37.4 C)  TempSrc: Axillary Oral Oral Oral  SpO2: 95% 94% 97%   Weight:      Height:       No intake or output data in the 24 hours ending 12/30/24 1512     12/27/2024   10:14 PM 12/26/2024   11:19 PM 11/15/2024   11:19 AM  Last 3 Weights  Weight (lbs) 232 lb 12.9 oz 229 lb 229 lb 0.9 oz  Weight (kg) 105.6 kg 103.874 kg 103.9 kg     Body mass index is 39.96 kg/m.   General: Ill appearing obese WM in no acute distress. Head: Normocephalic, atraumatic, sclera non-icteric, no xanthomas, nares are without discharge. ? Macroglossia. Neck: Negative for carotid bruits. JVP not elevated. Lungs: Quiet expiratory wheezing diffusely to auscultation. Mild increase in RR. No rhonchi. Heart: Irregularly irregular, S1 S2 without murmurs, rubs, or gallops.  Abdomen: Soft, non-tender, non-distended with normoactive bowel sounds. No rebound/guarding. Extremities: No clubbing or cyanosis. BLE edema with trunk like venous stasis changes.Distal pedal pulses are 2+ and equal bilaterally. Neuro: Densely encephalopathic, does not respond to sternal rub or sound of voice, does not follow commands Psych:  Does not participate in questioning   EKG:  The EKG was personally reviewed and demonstrates:   12/26/24: NSR 64bpm NSIVCD with nonspecific STTW changes Repeat ordered today while in AF - personally interpreted coarse atrial fib 101bpm, diffuse nonspecific STTW changes, EKG reading as sinus tach with acute inferior-posterior infarct but do not agree with interpretation  Telemetry:  Telemetry was personally reviewed and demonstrates:  Development of AF HR 90s-100s yesterday afternoon  Relevant CV Studies:  Limited echo 12/30/24  1. Limited echo   2. Left ventricular ejection fraction, by estimation, is 65 to 70%. The  left ventricle has normal function. The left ventricle has no regional  wall motion abnormalities. There is mild left ventricular hypertrophy.   3. Left atrial size was mildly dilated.   4. The aortic valve is tricuspid. Aortic valve regurgitation is not  visualized. No aortic stenosis is present.   5. There is moderately  elevated pulmonary artery systolic pressure. The  estimated right ventricular systolic pressure is 47.3 mmHg.   6. The inferior vena cava is dilated in size with <50% respiratory  variability, suggesting right atrial pressure of 15 mmHg.    Comparison(s): Changes from prior study are noted. 11/14/2024: LVEF  55-60%.   Laboratory Data: High Sensitivity Troponin:  No results for input(s): TROPONINIHS in the last 720 hours.  Recent Labs  Lab 12/26/24 2104 12/26/24 2318  TRNPT 14 13      Chemistry Recent Labs  Lab 12/28/24 0246 12/29/24 0256 12/30/24 0218  NA 152* 150* 151*  K 3.9 3.5 3.5  CL 120* 118* 117*  CO2 24 22 23   GLUCOSE 123* 154* 130*  BUN 26* 27* 32*  CREATININE 1.14 1.06 1.51*  CALCIUM  9.8 10.0 9.8  MG 2.4  --   --   GFRNONAA >60 >60 48*  ANIONGAP 7 10 11     Recent Labs  Lab 12/28/24 0246 12/29/24 0256 12/30/24 0218  PROT 7.1 7.3 7.0  ALBUMIN 3.1* 3.1* 3.0*  AST 69* 137* 663*  ALT 175* 196* 465*  ALKPHOS 168* 187* 283*  BILITOT 0.3 0.3 0.3   Lipids No results for input(s): CHOL, TRIG, HDL, LABVLDL, LDLCALC, CHOLHDL in the last 168 hours.  Hematology Recent Labs  Lab 12/28/24 0246 12/29/24 0256 12/30/24 0218  WBC 5.4 6.4 8.5  RBC 3.33* 3.51* 3.54*  HGB 9.5* 10.0* 10.0*  HCT 30.3* 32.2* 31.9*  MCV 91.0 91.7 90.1  MCH 28.5 28.5 28.2  MCHC 31.4 31.1 31.3  RDW 16.9* 16.9* 16.7*  PLT 158 165 178   Thyroid  Recent Labs  Lab 12/26/24 2104 12/26/24 2318 12/28/24 0246  TSH 9.120*  --   --   FREET4  --    < > 0.81   < > = values in this interval not displayed.    BNP Recent Labs  Lab 12/26/24 2104 12/28/24 0246 12/30/24 0218  PROBNP 243.0 415.0* 1,219.0*    DDimer No results for input(s): DDIMER in the last 168 hours.  Radiology/Studies:  ECHOCARDIOGRAM LIMITED Result Date: 12/30/2024    ECHOCARDIOGRAM LIMITED REPORT   Patient Name:   MEARLE DREW Date of Exam: 12/30/2024 Medical Rec #:  [id number]     Height:       64.0 in Accession #:    [id number]    Weight:       232.8 lb Date of Birth:  December 29, 1950      BSA:          2.087 m Patient Age:    73 years      BP:           93/47 mmHg Patient Gender: M             HR:           98 bpm. Exam Location:  Inpatient  Procedure: Limited Echo, Color Doppler, Cardiac Doppler and Intracardiac            Opacification Agent (Both Spectral and Color Flow Doppler were            utilized during procedure). Indications:    CHF  History:        Patient has prior history of Echocardiogram examinations, most                 recent 11/14/2024. CHF; Risk Factors:Former Smoker and Diabetes.  Sonographer:    Odella Brewster Referring Phys: [id number] RAMESH KC  Sonographer Comments:  Technically difficult study due to poor echo windows. Image acquisition challenging due to respiratory motion, Image acquisition challenging due to patient body habitus and patient positioning. IMPRESSIONS  1. Limited echo  2. Left ventricular ejection fraction, by estimation, is 65 to 70%. The left ventricle has normal function. The left ventricle has no regional wall motion abnormalities. There is mild left ventricular hypertrophy.  3. Left atrial size was mildly dilated.  4. The aortic valve is tricuspid. Aortic valve regurgitation is not visualized. No aortic stenosis is present.  5. There is moderately elevated pulmonary artery systolic pressure. The estimated right ventricular systolic pressure is 47.3 mmHg.  6. The inferior vena cava is dilated in size with <50% respiratory variability, suggesting right atrial pressure of 15 mmHg. Comparison(s): Changes from prior study are noted. 11/14/2024: LVEF 55-60%. FINDINGS  Left Ventricle: Left ventricular ejection fraction, by estimation, is 65 to 70%. The left ventricle has normal function. The left ventricle has no regional wall motion abnormalities. Definity  contrast agent was given IV to delineate the left ventricular  endocardial borders. There is mild left ventricular hypertrophy. Right Ventricle: There is moderately elevated pulmonary artery systolic pressure. The tricuspid regurgitant velocity is 2.84 m/s, and with an assumed right atrial pressure of 15 mmHg, the estimated right ventricular systolic pressure is  47.3 mmHg. Left Atrium: Left atrial size was mildly dilated. Right Atrium: Right atrial size was normal in size. Mitral Valve: MV peak gradient, 4.8 mmHg. The mean mitral valve gradient is 3.0 mmHg. Tricuspid Valve: The tricuspid valve is grossly normal. Tricuspid valve regurgitation is trivial. Aortic Valve: The aortic valve is tricuspid. Aortic valve regurgitation is not visualized. No aortic stenosis is present. Aortic valve mean gradient measures 3.0 mmHg. Aortic valve peak gradient measures 5.3 mmHg. Aortic valve area, by VTI measures 2.34 cm. Pulmonic Valve: The pulmonic valve was normal in structure. Pulmonic valve regurgitation is not visualized. Aorta: The aortic root and ascending aorta are structurally normal, with no evidence of dilitation. Venous: The inferior vena cava is dilated in size with less than 50% respiratory variability, suggesting right atrial pressure of 15 mmHg. IAS/Shunts: No atrial level shunt detected by color flow Doppler. Additional Comments: Spectral Doppler performed. Color Doppler performed.  LEFT VENTRICLE PLAX 2D LVIDd:         4.50 cm LVIDs:         3.10 cm LV PW:         1.10 cm LV IVS:        1.30 cm LVOT diam:     2.20 cm LV SV:         48 LV SV Index:   23 LVOT Area:     3.80 cm LV IVRT:       50 msec  LV Volumes (MOD) LV vol d, MOD A4C: 66.6 ml LV vol s, MOD A4C: 21.3 ml LV SV MOD A4C:     66.6 ml IVC IVC diam: 2.50 cm LEFT ATRIUM             Index        RIGHT ATRIUM           Index LA diam:        4.00 cm 1.92 cm/m   RA Area:     19.80 cm LA Vol (A2C):   70.2 ml 33.64 ml/m  RA Volume:   62.50 ml  29.95 ml/m LA Vol (A4C):   70.6 ml 33.83 ml/m LA Biplane Vol: 76.0 ml 36.42 ml/m  AORTIC VALVE AV Area (Vmax):    2.61 cm AV Area (Vmean):   2.55 cm AV Area (VTI):     2.34 cm AV Vmax:           115.00 cm/s AV Vmean:          84.800 cm/s AV VTI:            0.206 m AV Peak Grad:      5.3 mmHg AV Mean Grad:      3.0 mmHg LVOT Vmax:         79.00 cm/s LVOT Vmean:         56.800 cm/s LVOT VTI:          0.127 m LVOT/AV VTI ratio: 0.62  AORTA Ao Root diam: 3.00 cm Ao Asc diam:  2.90 cm MITRAL VALVE               TRICUSPID VALVE MV Area (PHT): 3.00 cm    TR Peak grad:   32.3 mmHg MV Area VTI:   4.27 cm    TR Vmax:        284.00 cm/s MV Peak grad:  4.8 mmHg MV Mean grad:  3.0 mmHg    SHUNTS MV Vmax:       1.09 m/s    Systemic VTI:  0.13 m MV Vmean:      76.5 cm/s   Systemic Diam: 2.20 cm MV Decel Time: 253 msec MV E velocity: 70.20 cm/s Vinie Maxcy MD Electronically signed by Vinie Maxcy MD Signature Date/Time: 12/30/2024/2:05:52 PM    Final    DG Chest Port 1 View Result Date: 12/30/2024 CLINICAL DATA:  Shortness of breath. EXAM: PORTABLE CHEST 1 VIEW COMPARISON:  December 28, 2024 FINDINGS: The cardiac silhouette is mildly enlarged and unchanged in size. Low lung volumes are noted. There is prominence of the pulmonary vasculature with mild diffusely increased interstitial lung markings. No focal consolidation, pleural effusion or pneumothorax is identified. The visualized skeletal structures are unremarkable. IMPRESSION: Cardiomegaly and low lung volumes with pulmonary vascular congestion and mild interstitial edema. Electronically Signed   By: Suzen Dials M.D.   On: 12/30/2024 12:59   US  ABDOMEN LIMITED RUQ (LIVER/GB) Result Date: 12/30/2024 EXAM: Right Upper Quadrant Abdominal Ultrasound TECHNIQUE: Real-time ultrasonography of the right upper quadrant of the abdomen was performed. COMPARISON: CT chest, abdomen, and pelvis 12/26/2024. CLINICAL HISTORY: 74 year old male with elevated liver enzymes. FINDINGS: LIVER: The liver demonstrates normal echogenicity. No intrahepatic biliary ductal dilatation. No mass. BILIARY SYSTEM: Gallbladder appears mildly distended and also thick walled (6 mm in thickness image 5). No sludge or stones identified within the lumen. No pericholecystic fluid. No sonographic Beverley sign is elicited. Common bile duct is within normal limits  measuring 3 mm. OTHER: Right kidney is partially visible, negative. No right upper quadrant ascites. IMPRESSION: 1. Nonspecific gallbladder wall thickening (6 mm). Given absence of sonographic Beverley sign favor this is reactive / secondary rather than related to acalculus cholecystitis. 2. Negative liver ultrasound appearance of the liver. Electronically signed by: Helayne Hurst MD 12/30/2024 11:34 AM EST RP Workstation: [id number]   DG CHEST PORT 1 VIEW Result Date: 12/28/2024 CLINICAL DATA:  Shortness of breath. EXAM: PORTABLE CHEST 1 VIEW COMPARISON:  12/26/2024 FINDINGS: Low volume film. The cardio pericardial silhouette is enlarged. Vascular congestion with interstitial edema evident. No dense consolidative airspace opacity. No substantial pleural effusion. Telemetry leads overlie the chest. IMPRESSION: Low volume film with vascular congestion and interstitial edema. Electronically Signed  By: Camellia Candle M.D.   On: 12/28/2024 06:21   MR BRAIN WO CONTRAST Result Date: 12/27/2024 CLINICAL DATA:  Left-sided weakness EXAM: MRI HEAD WITHOUT CONTRAST TECHNIQUE: Multiplanar, multiecho pulse sequences of the brain and surrounding structures were obtained without intravenous contrast. COMPARISON:  CT December 27, 2024 FINDINGS: MRI brain: There is extensive motion artifact. There are multiple foci of T2 hyperintensity in the cerebral white matter. These do not have restricted diffusion. There is an old lacunar infarct in the right-side of the cerebellum. No acute infarct. The ventricles are normal. No mass lesion. There are normal flow signals in the carotid arteries and basilar artery. No significant bone marrow signal abnormality. No significant abnormality in the paranasal sinuses or soft tissues. IMPRESSION: No acute infarct Mild chronic white matter abnormalities and old lacunar infarct in the right cerebellum. Electronically Signed   By: Nancyann Burns M.D.   On: 12/27/2024 16:33   EEG adult Result Date:  12/27/2024 Shelton Arlin KIDD, MD     12/27/2024 12:23 PM Patient Name: BUFORD BREMER MRN: [id number] Epilepsy Attending: Arlin KIDD Shelton Referring Physician/Provider: Waddell Karna LABOR, NP Date: 12/27/2024 Duration: 23.11 mins Patient history: 75yo M with ams. EEG to evaluate for seizure Level of alertness: Awake AEDs during EEG study: None Technical aspects: This EEG study was done with scalp electrodes positioned according to the 10-20 International system of electrode placement. Electrical activity was reviewed with band pass filter of 1-70Hz , sensitivity of 7 uV/mm, display speed of 58mm/sec with a 60Hz  notched filter applied as appropriate. EEG data were recorded continuously and digitally stored.  Video monitoring was available and reviewed as appropriate. Description: The posterior dominant rhythm consists of 8 Hz activity of moderate voltage (25-35 uV) seen predominantly in posterior head regions, symmetric and reactive to eye opening and eye closing. EEG showed intermittent generalized 3 to 6 Hz theta-delta slowing. Hyperventilation and photic stimulation were not performed.   ABNORMALITY - Intermittent slow, generalized IMPRESSION: This study is suggestive of mild generalized cerebral dysfunction ( encephalopathy). No seizures or epileptiform discharges were seen throughout the recording. Priyanka O Yadav   CT ANGIO HEAD NECK W WO CM (CODE STROKE) Result Date: 12/27/2024 EXAM: CTA HEAD AND NECK WITH AND WITHOUT 12/27/2024 10:11:44 AM TECHNIQUE: CTA of the head and neck was performed with and without the administration of 75 mL iohexol  (OMNIPAQUE ) 350 MG/ML injection. Multiplanar 2D and/or 3D reformatted images are provided for review. Automated exposure control, iterative reconstruction, and/or weight based adjustment of the mA/kV was utilized to reduce the radiation dose to as low as reasonably achievable. Stenosis of the internal carotid arteries measured using NASCET criteria. COMPARISON: Same day CT head  and CTA head and neck 11/12/2024 CLINICAL HISTORY: Neuro deficit, acute, stroke suspected. Acute neurologic deficit; suspected stroke. FINDINGS: CTA NECK: AORTIC ARCH AND ARCH VESSELS: Mild atherosclerosis of the aortic arch. Mild atherosclerosis along the proximal right subclavian artery. The distal right subclavian and right axillary arteries are obscured due to streak artifact from dense venous contrast. Atherosclerosis along the proximal left subclavian artery without high grade stenosis. No dissection or arterial injury. No significant stenosis of the brachiocephalic arteries. CERVICAL CAROTID ARTERIES: Ossified atherosclerosis along the proximal right cervical ICA without hemodynamically significant stenosis. Calcified atherosclerosis at the left carotid bifurcation and left carotid bulb. There is bulky near circumferential atherosclerotic plaque along the left carotid bulb/proximal left cervical ICA which results in approximately 80% stenosis similar to prior. No dissection, arterial injury. CERVICAL VERTEBRAL ARTERIES: Similar occlusion  of the left vertebral artery at its origin. The left vertebral artery remains occluded to the intracranial segment. There is additional atherosclerosis at the right vertebral artery origin which results in moderate stenosis. The right vertebral artery is patent to the vertebrobasilar confluence. No dissection, arterial injury. LUNGS AND MEDIASTINUM: Unremarkable. SOFT TISSUES: No acute abnormality. BONES: Degenerative changes in the visualized spine. Maxilla and mandible are visualized. SINUSES: Mucosal thickening in the bilateral ethmoid sinuses. Mucosal thickening and air fluid levels in the bilateral maxillary sinuses left greater than right. CTA HEAD: ANTERIOR CIRCULATION: Atherosclerosis in the bilateral carotid siphons without high grade stenosis. There is fetal origin of the bilateral PCAs. No significant stenosis of the anterior cerebral arteries. No significant  stenosis of the middle cerebral arteries. No aneurysm. POSTERIOR CIRCULATION: There is fetal origin of the bilateral PCAs. The basilar artery is patent with distal tapering noted beyond the origins of the superior cerebellar arteries. The left vertebral artery remains occluded to the intracranial segment. The right vertebral artery is patent to the vertebrobasilar confluence. No significant stenosis of the posterior cerebral arteries. No aneurysm. OTHER: No dural venous sinus thrombosis on this non-dedicated study. IMPRESSION: 1. No acute large vessel occlusion. 2. Occlusion of the left vertebral artery at its origin, remaining occluded to the intracranial segment. Findings are chronic and unchanged. 3. Bulky near circumferential atherosclerotic plaque along the left carotid bulb/proximal left cervical ICA resulting in approximately 80% stenosis, similar to prior. 4. Moderate stenosis of the right vertebral artery origin. Electronically signed by: Donnice Mania MD 12/27/2024 10:31 AM EST RP Workstation: [id number]   CT HEAD CODE STROKE WO CONTRAST Result Date: 12/27/2024 EXAM: CT HEAD WITHOUT CONTRAST 12/27/2024 09:49:38 AM TECHNIQUE: CT of the head was performed without the administration of intravenous contrast. Automated exposure control, iterative reconstruction, and/or weight based adjustment of the mA/kV was utilized to reduce the radiation dose to as low as reasonably achievable. COMPARISON: 12/26/2024 CLINICAL HISTORY: Acute neurologic deficit; stroke suspected. FINDINGS: BRAIN AND VENTRICLES: No acute hemorrhage. No evidence of acute infarct. No hydrocephalus. No extra-axial collection. No mass effect or midline shift. Remote lacunar infarct in the right thalamus. Suspected additional remote lacunar infarct in the left thalamus. Redemonstrated remote infarcts in the right cerebellum. Moderate chronic microvascular ischemic change. Bilateral basal ganglia mineralization. ORBITS: Bilateral lens replacement.  SINUSES: Mucosal thickening bilateral maxillary sinuses. SOFT TISSUES AND SKULL: No acute soft tissue abnormality. No skull fracture. [place] Stroke Program Early CT (ASPECT) score: Ganglionic (caudate, IC, lentiform nucleus, insula, M1-M3): 7 Supraganglionic (M4-M6): 3 Total: 10 IMPRESSION: 1. No acute intracranial abnormality. 2. ASPECTS 10. 3. Remote lacunar infarct in the right thalamus, suspected additional remote lacunar infarct in the left thalamus, and remote infarct in the right cerebellum. 4. Findings messaged to Dr. Michaela via the [facility] messaging system at 9:59 AM on 12/27/24. Electronically signed by: Donnice Mania MD 12/27/2024 10:02 AM EST RP Workstation: [id number]   CT CHEST ABDOMEN PELVIS W CONTRAST Result Date: 12/26/2024 CLINICAL DATA:  Sepsis, short of breath, altered level of consciousness EXAM: CT CHEST, ABDOMEN, AND PELVIS WITH CONTRAST TECHNIQUE: Multidetector CT imaging of the chest, abdomen and pelvis was performed following the standard protocol during bolus administration of intravenous contrast. RADIATION DOSE REDUCTION: This exam was performed according to the departmental dose-optimization program which includes automated exposure control, adjustment of the mA and/or kV according to patient size and/or use of iterative reconstruction technique. CONTRAST:  75mL OMNIPAQUE  IOHEXOL  350 MG/ML SOLN COMPARISON:  12/26/2024, 06/25/2015 FINDINGS: CT CHEST FINDINGS Cardiovascular:  Borderline cardiomegaly without pericardial effusion. No evidence of thoracic aortic aneurysm or dissection. Atherosclerosis of the aorta and coronary vasculature. Mediastinum/Nodes: No enlarged mediastinal, hilar, or axillary lymph nodes. Thyroid gland, trachea, and esophagus demonstrate no significant findings. Lungs/Pleura: Dependent areas of linear consolidation within the bilateral lower lobes, left greater than right, favor atelectasis over airspace disease. No effusion or pneumothorax. Musculoskeletal: No  acute or destructive bony abnormalities. Reconstructed images demonstrate no additional findings. CT ABDOMEN PELVIS FINDINGS Hepatobiliary: Moderate gallbladder distention without cholelithiasis or cholecystitis. The liver is unremarkable without biliary duct dilation. Pancreas: Unremarkable. No pancreatic ductal dilatation or surrounding inflammatory changes. Spleen: Normal in size without focal abnormality. Adrenals/Urinary Tract: The adrenals are stable. Simple left renal cyst does not require imaging follow-up. Kidneys enhance normally. No urinary tract calculi or obstructive uropathy. The bladder is decompressed, with diffuse bladder wall thickening likely reflecting sequela of chronic bladder outlet obstruction given marked enlargement of the prostate. Stomach/Bowel: No bowel obstruction or ileus. Normal appendix right lower quadrant. No bowel wall thickening or inflammatory change. Vascular/Lymphatic: Aortic atherosclerosis. No enlarged abdominal or pelvic lymph nodes. Reproductive: Stable marked enlargement of the prostate. Other: No free fluid or free intraperitoneal gas. No abdominal wall hernia. Musculoskeletal: No acute or destructive bony abnormalities. Bilateral hip osteoarthritis. Reconstructed images demonstrate no additional findings. IMPRESSION: 1. Bilateral dependent lower lobe consolidation, favor atelectasis over pneumonia. 2. Cardiomegaly. 3. Marked prostatomegaly, with evidence of chronic bladder outlet obstruction. 4.  Aortic Atherosclerosis (ICD10-I70.0). Electronically Signed   By: Ozell Daring M.D.   On: 12/26/2024 23:56   CT Head Wo Contrast Result Date: 12/26/2024 EXAM: CT HEAD WITHOUT CONTRAST 12/26/2024 09:42:00 PM TECHNIQUE: CT of the head was performed without the administration of intravenous contrast. Automated exposure control, iterative reconstruction, and/or weight based adjustment of the mA/kV was utilized to reduce the radiation dose to as low as reasonably achievable.  COMPARISON: Brain MRI at 11/12/2024. CLINICAL HISTORY: Mental status change, unknown cause. FINDINGS: BRAIN AND VENTRICLES: No acute hemorrhage. No evidence of acute infarct. There is mild-to-moderate global atrophy and moderate small vessel disease of the cerebral white matter with mild atrophic ventriculomegaly. Minimal calcification in the basal ganglia. There is a small chronic linear lacunar infarct in the right cerebellar hemisphere. There is a tiny chronic right thalamic lacunar infarct. Patchy calcific plaque in both carotid siphons. No hyperdense vessel is seen. No extra-axial collection. No mass effect or midline shift. ORBITS: Lens placements are again noted, otherwise negative orbits. SINUSES: There is mild membrane disease in the frontal, sphenoid, and maxillary sinuses, low-density mild fluid in both maxillary sinuses which could indicate acute-on-chronic sinusitis, and moderate patchy mucosal thickening throughout the ethmoid air cells. The findings are new from 11/12/2024. The mastoid air cells are clear. SOFT TISSUES AND SKULL: No acute soft tissue abnormality. No skull fracture. IMPRESSION: 1. Atrophy, small vessel changes, and old lacunar infarcts. No acute intracranial CT findings. 2. Mild membrane disease in the frontal, sphenoid, and maxillary sinuses, low-density mild fluid in both maxillary sinuses suggestive of acute-on-chronic sinusitis, and moderate patchy mucosal thickening throughout the ethmoid air cells, all new from 11/12/2024. Electronically signed by: Francis Quam MD 12/26/2024 10:05 PM EST RP Workstation: [id number]   DG Chest Portable 1 View Result Date: 12/26/2024 CLINICAL DATA:  Altered level of consciousness EXAM: PORTABLE CHEST 1 VIEW COMPARISON:  11/12/2024 FINDINGS: Single frontal view of the chest demonstrates an unremarkable cardiac silhouette. No airspace disease, effusion, or pneumothorax. No acute bony abnormalities. IMPRESSION: 1. No acute intrathoracic process.  Electronically Signed   By: Ozell Daring M.D.   On: 12/26/2024 21:36     Assessment and Plan:  1. Persistent metabolic encephalopathy superimposed on possible dementia, recurrent hospitalizations with stroke-like symptoms with TIA/occult strokes, admitted with hypothermia, hypotension, possible sepsis 2. Acute hypoxic respiratory failure previously requiring NRB, now on [place] O2, with mild increase WOB 3. Acute on chronic HFpEF 4. PAF with recurrence 5. Abnormal EKG 6. Persistent hypernatremia with AKI on CKD 3a 7. BPH with marked prostatomegaly with chronic bladder outlet obstruction 8. Severe OSA intolerant of CPAP 9. Transaminitis of unclear etiology 10. Cerebrovascular disease with occluded L vertebral, moderate R vertebral, 80% L cervical ICA stenosis 11. Spinal stenosis, chronic LLE weakness  Patient has failed to progress/worsened over the last day with continued significant encephalopathy, unable to meaningfully participate in interaction. Tele shows development of AF yesterday afternoon, rates not particularly high for degree of medical illness (90s-100s, with previous sinus rates 50s-60s). Clinical picture is that of multiorgan dysfunction. There was concern for declining BP earlier today; lactate WNL and follow-up BPs improved without specific intervention. Patient essentially without significant PO intake due to encephalopathy. EKG shows AF with question of inf/post infarct but nonspecific - d/w Dr. Raford, repeat EKG with posterior leads ordered with troponin but do not anticipate aggressive strategy, doubt AMI, nor would this specifically explain his persistent decreased responsiveness. This EKG is essentially the same as when he was in AFib in 02/2024. Given chronic HFpEF, recurrent strokes, appearance of macroglossia on exam, query whether he has had underlying amyloidosis. Will review clinical case further with MD.   Risk Assessment/Risk Scores:       [place]  Heart  Association (NYHA) Functional Class NYHA Class IV  CHA2DS2-VASc Score = 7   This indicates a 11.2% annual risk of stroke. The patient's score is based upon: CHF History: 1 HTN History: 1 Diabetes History: 1 Stroke History: 2 Vascular Disease History: 1 (aortic atherosclerosis, cerebrovascular disease) Age Score: 1 Gender Score: 0       For questions or updates, please contact [facility] Please consult [email or web address] for contact info under      Signed, Raphael LOISE Bring, PA-C  12/30/2024 3:12 PM     [1] No Known Allergies  "

## 2024-12-30 NOTE — Progress Notes (Signed)
 Physical Therapy Treatment Patient Details Name: Raymond Gordon MRN: [id number] DOB: 01-24-1951 Today's Date: 12/30/2024   History of Present Illness Patient is a 74 yo male presenting [facility] on [facility]1 due to hypothermia, shortness of breath and acute metabolic encephalopathy. Code stroke activated 2/1, MRI clear. EEG showing cerebral dysfunction. 2/5 CXR showed cardiomegaly and low lung volumes with pulmonary vascular congestion and mild interstitial edema. PMHx: DMII, HLD, gout, afib, HTN, CHF, OSA, CKDIII, anemia, dementia, chronic L sided weakness, BPH with chronic indwelling foley catheter.    PT Comments  Pt received in supine with family present. Family reported pt has been sleeping most of the day with obtunded state during session. Pt was unable to follow commands with pt grimacing when rolling. Performed multiple rolls for pericare and bed pad change with TotalAx2 required. Downgraded goals to reflect current mobility level. Will continue to recommend <3hrs post acute rehab with acute PT to follow.     If plan is discharge home, recommend the following: A lot of help with walking and/or transfers;A lot of help with bathing/dressing/bathroom;Assistance with cooking/housework;Assist for transportation;Help with stairs or ramp for entrance;Direct supervision/assist for medications management;Direct supervision/assist for financial management   Can travel by private vehicle     No  Equipment Recommendations  BSC/3in1;Wheelchair (measurements PT);Wheelchair cushion (measurements PT);Hospital bed;Hoyer lift;Rolling walker (2 wheels)       Precautions / Restrictions Precautions Precautions: Fall Recall of Precautions/Restrictions: Impaired Precaution/Restrictions Comments: watch HR and O2 Restrictions Weight Bearing Restrictions Per Provider Order: No     Mobility  Bed Mobility Overal bed mobility: Needs Assistance Bed Mobility: Rolling Rolling: Total assist, +2 for physical assistance,  +2 for safety/equipment    General bed mobility comments: TotalAx2 to roll for pericare and new pads. Unable to follow commands for movement    Transfers    General transfer comment: deferred 2/2 level of alertness       Balance Overall balance assessment: Needs assistance     Communication Communication Communication: Impaired Factors Affecting Communication: Reduced clarity of speech;Difficulty expressing self  Cognition Arousal: Obtunded Behavior During Therapy: Flat affect   PT - Cognitive impairments: History of cognitive impairments, Difficult to assess Difficult to assess due to: Level of arousal      Following commands: Impaired Following commands impaired:  (not following commands)    Cueing Cueing Techniques: Verbal cues, Gestural cues, Tactile cues, Visual cues         Pertinent Vitals/Pain Pain Assessment Facial Expression: Relaxed, neutral Body Movements: Absence of movements Muscle Tension: Relaxed Compliance with ventilator (intubated pts.): N/A Vocalization (extubated pts.): Talking in normal tone or no sound CPOT Total: 0 Pain Intervention(s): Limited activity within patient's tolerance, Monitored during session, Repositioned     PT Goals (current goals can now be found in the care plan section) Acute Rehab PT Goals PT Goal Formulation: With patient Time For Goal Achievement: 01/11/25 Potential to Achieve Goals: Fair Progress towards PT goals: Not progressing toward goals - comment;Goals downgraded-see care plan    Frequency    Min 2X/week       AM-PAC PT 6 Clicks Mobility   Outcome Measure  Help needed turning from your back to your side while in a flat bed without using bedrails?: Total Help needed moving from lying on your back to sitting on the side of a flat bed without using bedrails?: Total Help needed moving to and from a bed to a chair (including a wheelchair)?: Total Help needed standing up from a  chair using your arms  (e.g., wheelchair or bedside chair)?: Total Help needed to walk in hospital room?: Total Help needed climbing 3-5 steps with a railing? : Total 6 Click Score: 6    End of Session   Activity Tolerance: Patient limited by lethargy Patient left: in bed;with call bell/phone within reach;with bed alarm set;with family/visitor present Nurse Communication: Mobility status;Other (comment) (in room to assist with pericare) PT Visit Diagnosis: Unsteadiness on feet (R26.81);Other abnormalities of gait and mobility (R26.89);Muscle weakness (generalized) (M62.81)     Time: 8547-8492 PT Time Calculation (min) (ACUTE ONLY): 15 min  Charges:    $Therapeutic Activity: 8-22 mins PT General Charges $$ ACUTE PT VISIT: 1 Visit                    Kate ORN, PT, DPT Secure Chat Preferred  Rehab Office [phone]    Kate BRAVO Wendolyn 12/30/2024, 4:29 PM

## 2024-12-31 ENCOUNTER — Inpatient Hospital Stay (HOSPITAL_COMMUNITY)

## 2024-12-31 DIAGNOSIS — A419 Sepsis, unspecified organism: Secondary | ICD-10-CM

## 2024-12-31 DIAGNOSIS — R509 Fever, unspecified: Secondary | ICD-10-CM | POA: Insufficient documentation

## 2024-12-31 DIAGNOSIS — R748 Abnormal levels of other serum enzymes: Secondary | ICD-10-CM

## 2024-12-31 LAB — RESPIRATORY PANEL BY PCR

## 2024-12-31 LAB — COMPREHENSIVE METABOLIC PANEL WITH GFR
ALT: 515 U/L — ABNORMAL HIGH (ref 0–44)
ALT: 410 U/L — ABNORMAL HIGH (ref 0–44)
AST: 820 U/L — ABNORMAL HIGH (ref 15–41)
AST: 546 U/L — ABNORMAL HIGH (ref 15–41)
Albumin: 2.8 g/dL — ABNORMAL LOW (ref 3.5–5.0)
Albumin: 2.4 g/dL — ABNORMAL LOW (ref 3.5–5.0)
Alkaline Phosphatase: 404 U/L — ABNORMAL HIGH (ref 38–126)
Alkaline Phosphatase: 369 U/L — ABNORMAL HIGH (ref 38–126)
Anion gap: 14 (ref 5–15)
Anion gap: 15 (ref 5–15)
BUN: 46 mg/dL — ABNORMAL HIGH (ref 8–23)
BUN: 47 mg/dL — ABNORMAL HIGH (ref 8–23)
CO2: 19 mmol/L — ABNORMAL LOW (ref 22–32)
CO2: 16 mmol/L — ABNORMAL LOW (ref 22–32)
Calcium: 11.9 mg/dL — ABNORMAL HIGH (ref 8.9–10.3)
Calcium: 8.8 mg/dL — ABNORMAL LOW (ref 8.9–10.3)
Chloride: 115 mmol/L — ABNORMAL HIGH (ref 98–111)
Chloride: 116 mmol/L — ABNORMAL HIGH (ref 98–111)
Creatinine, Ser: 2.45 mg/dL — ABNORMAL HIGH (ref 0.61–1.24)
Creatinine, Ser: 2.73 mg/dL — ABNORMAL HIGH (ref 0.61–1.24)
GFR, Estimated: 27 mL/min — ABNORMAL LOW
GFR, Estimated: 24 mL/min — ABNORMAL LOW
Glucose, Bld: 141 mg/dL — ABNORMAL HIGH (ref 70–99)
Glucose, Bld: 131 mg/dL — ABNORMAL HIGH (ref 70–99)
Potassium: 3.5 mmol/L (ref 3.5–5.1)
Potassium: 3.3 mmol/L — ABNORMAL LOW (ref 3.5–5.1)
Sodium: 148 mmol/L — ABNORMAL HIGH (ref 135–145)
Sodium: 147 mmol/L — ABNORMAL HIGH (ref 135–145)
Total Bilirubin: 0.6 mg/dL (ref 0.0–1.2)
Total Bilirubin: 0.5 mg/dL (ref 0.0–1.2)
Total Protein: 7.1 g/dL (ref 6.5–8.1)
Total Protein: 6.6 g/dL (ref 6.5–8.1)

## 2024-12-31 LAB — CBC WITH DIFFERENTIAL/PLATELET
Abs Immature Granulocytes: 0.22 10*3/uL — ABNORMAL HIGH (ref 0.00–0.07)
Basophils Absolute: 0 10*3/uL (ref 0.0–0.1)
Basophils Relative: 1 %
Eosinophils Absolute: 0 10*3/uL (ref 0.0–0.5)
Eosinophils Relative: 0 %
HCT: 28.2 % — ABNORMAL LOW (ref 39.0–52.0)
Hemoglobin: 9.2 g/dL — ABNORMAL LOW (ref 13.0–17.0)
Immature Granulocytes: 3 %
Lymphocytes Relative: 26 %
Lymphs Abs: 1.9 10*3/uL (ref 0.7–4.0)
MCH: 28.9 pg (ref 26.0–34.0)
MCHC: 32.6 g/dL (ref 30.0–36.0)
MCV: 88.7 fL (ref 80.0–100.0)
Monocytes Absolute: 0.8 10*3/uL (ref 0.1–1.0)
Monocytes Relative: 10 %
Neutro Abs: 4.5 10*3/uL (ref 1.7–7.7)
Neutrophils Relative %: 60 %
Platelets: 187 10*3/uL (ref 150–400)
RBC: 3.18 MIL/uL — ABNORMAL LOW (ref 4.22–5.81)
RDW: 16.5 % — ABNORMAL HIGH (ref 11.5–15.5)
WBC: 7.4 10*3/uL (ref 4.0–10.5)
nRBC: 2.8 % — ABNORMAL HIGH (ref 0.0–0.2)

## 2024-12-31 LAB — BASIC METABOLIC PANEL WITH GFR
Anion gap: 14 (ref 5–15)
BUN: 45 mg/dL — ABNORMAL HIGH (ref 8–23)
CO2: 17 mmol/L — ABNORMAL LOW (ref 22–32)
Calcium: 8.8 mg/dL — ABNORMAL LOW (ref 8.9–10.3)
Chloride: 110 mmol/L (ref 98–111)
Creatinine, Ser: 2.57 mg/dL — ABNORMAL HIGH (ref 0.61–1.24)
GFR, Estimated: 25 mL/min — ABNORMAL LOW
Glucose, Bld: 283 mg/dL — ABNORMAL HIGH (ref 70–99)
Potassium: 3 mmol/L — ABNORMAL LOW (ref 3.5–5.1)
Sodium: 141 mmol/L (ref 135–145)

## 2024-12-31 LAB — CBC
HCT: 30.8 % — ABNORMAL LOW (ref 39.0–52.0)
Hemoglobin: 9.9 g/dL — ABNORMAL LOW (ref 13.0–17.0)
MCH: 28.4 pg (ref 26.0–34.0)
MCHC: 32.1 g/dL (ref 30.0–36.0)
MCV: 88.5 fL (ref 80.0–100.0)
Platelets: 183 10*3/uL (ref 150–400)
RBC: 3.48 MIL/uL — ABNORMAL LOW (ref 4.22–5.81)
RDW: 16.5 % — ABNORMAL HIGH (ref 11.5–15.5)
WBC: 8 10*3/uL (ref 4.0–10.5)
nRBC: 3.5 % — ABNORMAL HIGH (ref 0.0–0.2)

## 2024-12-31 LAB — CULTURE, BLOOD (ROUTINE X 2)
Culture: NO GROWTH
Culture: NO GROWTH
Special Requests: ADEQUATE

## 2024-12-31 LAB — PROTIME-INR
INR: 1.9 — ABNORMAL HIGH (ref 0.8–1.2)
Prothrombin Time: 22.3 s — ABNORMAL HIGH (ref 11.4–15.2)

## 2024-12-31 LAB — GLUCOSE, CAPILLARY
Glucose-Capillary: 143 mg/dL — ABNORMAL HIGH (ref 70–99)
Glucose-Capillary: 120 mg/dL — ABNORMAL HIGH (ref 70–99)
Glucose-Capillary: 113 mg/dL — ABNORMAL HIGH (ref 70–99)

## 2024-12-31 LAB — TROPONIN T, HIGH SENSITIVITY: Troponin T High Sensitivity: 86 ng/L — ABNORMAL HIGH (ref 0–19)

## 2024-12-31 LAB — CREATININE, URINE, RANDOM: Creatinine, Urine: 176 mg/dL

## 2024-12-31 LAB — CK: Total CK: 33 U/L — ABNORMAL LOW (ref 49–397)

## 2024-12-31 LAB — SODIUM, URINE, RANDOM: Sodium, Ur: <30 mmol/L

## 2024-12-31 MED ORDER — ACETAMINOPHEN 650 MG RE SUPP
650.0000 mg | Freq: Once | RECTAL | Status: AC
  Administered 2024-12-31: 650 mg via RECTAL
  Filled 2024-12-31: qty 1

## 2024-12-31 MED ORDER — ENOXAPARIN SODIUM 100 MG/ML IJ SOSY: 100.0000 mg | PREFILLED_SYRINGE | INTRAMUSCULAR | Status: AC

## 2024-12-31 MED ORDER — DEXTROSE 5 % IV SOLN: INTRAVENOUS | Status: AC

## 2024-12-31 MED ORDER — ACETAMINOPHEN 650 MG RE SUPP: 650.0000 mg | Freq: Three times a day (TID) | RECTAL | Status: AC | PRN

## 2024-12-31 MED ORDER — LINEZOLID 600 MG/300ML IV SOLN
600.0000 mg | Freq: Two times a day (BID) | INTRAVENOUS | Status: AC
  Administered 2024-12-31: 600 mg via INTRAVENOUS
  Filled 2024-12-31: qty 300

## 2024-12-31 MED ORDER — POTASSIUM CHLORIDE 10 MEQ/100ML IV SOLN
10.0000 meq | INTRAVENOUS | Status: AC
  Administered 2024-12-31 (×4): 10 meq via INTRAVENOUS
  Filled 2024-12-31 (×4): qty 100

## 2024-12-31 MED ORDER — SODIUM CHLORIDE 0.9 % IV SOLN
100.0000 mg | Freq: Two times a day (BID) | INTRAVENOUS | Status: DC
  Administered 2024-12-31: 100 mg via INTRAVENOUS
  Filled 2024-12-31: qty 100

## 2024-12-31 NOTE — Progress Notes (Signed)
 "  Rounding Note   Patient Name: Raymond Gordon Date of Encounter: 12/31/2024  [facility] Cardiologist: Dorn Lesches, MD   Subjective Unable to assess.  Patient is obtunded.   Scheduled Meds:  arformoterol   15 mcg Nebulization BID   budesonide  (PULMICORT ) nebulizer solution  0.25 mg Nebulization BID   Chlorhexidine  Gluconate Cloth  6 each Topical Daily   [START ON 01/01/2025] enoxaparin  (LOVENOX ) injection  100 mg Subcutaneous Q24H   insulin  aspart  0-6 Units Subcutaneous TID WC   pantoprazole  (PROTONIX ) IV  40 mg Intravenous Q24H   revefenacin   175 mcg Nebulization Daily   Continuous Infusions:  cefTRIAXone  (ROCEPHIN )  IV Stopped (12/30/24 1236)   dextrose      potassium chloride      PRN Meds:    Vital Signs  Vitals:   12/31/24 0749 12/31/24 0826 12/31/24 1000 12/31/24 1015  BP: (!) 121/46     Pulse: (!) 115     Resp: (!) 30     Temp: (!) 101.1 F (38.4 C)  (!) 100.8 F (38.2 C)   TempSrc: Oral  Axillary   SpO2: 100% 100%  100%  Weight:      Height:        Intake/Output Summary (Last 24 hours) at 12/31/2024 1022 Last data filed at 12/31/2024 0500 Gross per 24 hour  Intake 1119.04 ml  Output 330 ml  Net 789.04 ml      12/27/2024   10:14 PM 12/26/2024   11:19 PM 11/15/2024   11:19 AM  Last 3 Weights  Weight (lbs) 232 lb 12.9 oz 229 lb 229 lb 0.9 oz  Weight (kg) 105.6 kg 103.874 kg 103.9 kg      Telemetry Atrial fibrillation.  PVCs.  Converted to sinus rhythm. - Personally Reviewed  ECG  Total fibrillation.  Rate 116 bpm.  Marked ST abnormalities- Personally Reviewed  Physical Exam  GENERAL:  Ill-appearing.  Encephalopathic and not responding HEENT: Pupils equal round and reactive, fundi not visualized, oral mucosa unremarkable.  Tongue large and protruding NECK:  No jugular venous distention, waveform within normal limits, carotid upstroke brisk and symmetric, no bruits, no thyromegaly LUNGS:  Mild wheezing on anterior exam HEART: Regular rate and  rhythm PMI not displaced or sustained,S1 and S2 within normal limits, no S3, no S4, no clicks, no rubs, no murmurs ABD:  Flat, positive bowel sounds normal in frequency in pitch, no bruits, no rebound, no guarding, no midline pulsatile mass, no hepatomegaly, no splenomegaly EXT:  2 plus pulses throughout, non-pitting woody edema no cyanosis no clubbing SKIN:  No rashes no nodules NEURO: Unable to assess.  Patient is obtunded. PSYCH: Unable to assess.  Patient is obtunded  Labs High Sensitivity Troponin:  No results for input(s): TROPONINIHS in the last 720 hours.  Recent Labs  Lab 12/26/24 2104 12/26/24 2318 12/30/24 1601  TRNPT 14 13 86*       Chemistry Recent Labs  Lab 12/28/24 0246 12/29/24 0256 12/30/24 0218 12/31/24 0322 12/31/24 0849  NA 152* 150* 151* 148* 141  K 3.9 3.5 3.5 3.5 3.0*  CL 120* 118* 117* 115* 110  CO2 24 22 23  19* 17*  GLUCOSE 123* 154* 130* 141* 283*  BUN 26* 27* 32* 46* 45*  CREATININE 1.14 1.06 1.51* 2.45* 2.57*  CALCIUM  9.8 10.0 9.8 11.9* 8.8*  MG 2.4  --   --   --   --   PROT 7.1 7.3 7.0 7.1  --   ALBUMIN 3.1* 3.1* 3.0*  2.8*  --   AST 69* 137* 663* 820*  --   ALT 175* 196* 465* 515*  --   ALKPHOS 168* 187* 283* 404*  --   BILITOT 0.3 0.3 0.3 0.6  --   GFRNONAA >60 >60 48* 27* 25*  ANIONGAP 7 10 11 14 14     Lipids No results for input(s): CHOL, TRIG, HDL, LABVLDL, LDLCALC, CHOLHDL in the last 168 hours.  Hematology Recent Labs  Lab 12/29/24 0256 12/30/24 0218 12/31/24 0322  WBC 6.4 8.5 8.0  RBC 3.51* 3.54* 3.48*  HGB 10.0* 10.0* 9.9*  HCT 32.2* 31.9* 30.8*  MCV 91.7 90.1 88.5  MCH 28.5 28.2 28.4  MCHC 31.1 31.3 32.1  RDW 16.9* 16.7* 16.5*  PLT 165 178 183   Thyroid  Recent Labs  Lab 12/26/24 2104 12/26/24 2318 12/28/24 0246  TSH 9.120*  --   --   FREET4  --    < > 0.81   < > = values in this interval not displayed.    BNP Recent Labs  Lab 12/26/24 2104 12/28/24 0246 12/30/24 0218  PROBNP 243.0 415.0*  1,219.0*    DDimer No results for input(s): DDIMER in the last 168 hours.   Radiology  ECHOCARDIOGRAM LIMITED Result Date: 12/30/2024    ECHOCARDIOGRAM LIMITED REPORT   Patient Name:   Raymond Gordon Date of Exam: 12/30/2024 Medical Rec #:  [id number]     Height:       64.0 in Accession #:    [id number]    Weight:       232.8 lb Date of Birth:  12/20/1950      BSA:          2.087 m Patient Age:    73 years      BP:           93/47 mmHg Patient Gender: M             HR:           98 bpm. Exam Location:  Inpatient Procedure: Limited Echo, Color Doppler, Cardiac Doppler and Intracardiac            Opacification Agent (Both Spectral and Color Flow Doppler were            utilized during procedure). Indications:     CHF  History:         Patient has prior history of Echocardiogram examinations, most                  recent 11/14/2024. CHF; Risk Factors:Former Smoker and                  Diabetes.  Sonographer:     [facility] Referring Phys:  [id number] RAMESH KC Diagnosing Phys: Vinie Maxcy MD  Sonographer Comments: Technically difficult study due to poor echo windows. Image acquisition challenging due to respiratory motion, Image acquisition challenging due to patient body habitus and patient positioning. IMPRESSIONS  1. Limited echo  2. Right ventricular systolic function is normal. The right ventricular size is normal. There is moderately elevated pulmonary artery systolic pressure. The estimated right ventricular systolic pressure is 47.3 mmHg.  3. Left ventricular ejection fraction, by estimation, is 65 to 70%. The left ventricle has normal function. The left ventricle has no regional wall motion abnormalities. There is mild left ventricular hypertrophy. The average left ventricular global longitudinal strain is -18.3 %. The global longitudinal strain is mildly abnormal along the septum, but no evidence for  amyloid pattern.  4. Left atrial size was mildly dilated.  5. The aortic valve is tricuspid. Aortic valve  regurgitation is not visualized. No aortic stenosis is present.  6. The inferior vena cava is dilated in size with <50% respiratory variability, suggesting right atrial pressure of 15 mmHg. Comparison(s): Changes from prior study are noted. 11/14/2024: LVEF 55-60%. FINDINGS  Left Ventricle: Left ventricular ejection fraction, by estimation, is 65 to 70%. The left ventricle has normal function. The left ventricle has no regional wall motion abnormalities. Definity  contrast agent was given IV to delineate the left ventricular  endocardial borders. The average left ventricular global longitudinal strain is -18.3 %. Strain was performed and the global longitudinal strain is abnormal. There is mild left ventricular hypertrophy. Right Ventricle: The right ventricular size is normal. No increase in right ventricular wall thickness. Right ventricular systolic function is normal. There is moderately elevated pulmonary artery systolic pressure. The tricuspid regurgitant velocity is 2.84 m/s, and with an assumed right atrial pressure of 15 mmHg, the estimated right ventricular systolic pressure is 47.3 mmHg. Left Atrium: Left atrial size was mildly dilated. Right Atrium: Right atrial size was normal in size. Mitral Valve: MV peak gradient, 4.8 mmHg. The mean mitral valve gradient is 3.0 mmHg. Tricuspid Valve: The tricuspid valve is grossly normal. Tricuspid valve regurgitation is trivial. Aortic Valve: The aortic valve is tricuspid. Aortic valve regurgitation is not visualized. No aortic stenosis is present. Aortic valve mean gradient measures 3.0 mmHg. Aortic valve peak gradient measures 5.3 mmHg. Aortic valve area, by VTI measures 2.34 cm. Pulmonic Valve: The pulmonic valve was normal in structure. Pulmonic valve regurgitation is not visualized. Aorta: The aortic root and ascending aorta are structurally normal, with no evidence of dilitation. Venous: The inferior vena cava is dilated in size with less than 50% respiratory  variability, suggesting right atrial pressure of 15 mmHg. IAS/Shunts: No atrial level shunt detected by color flow Doppler. Additional Comments: Spectral Doppler performed. Color Doppler performed.  LEFT VENTRICLE PLAX 2D LVIDd:         4.50 cm LVIDs:         3.10 cm     2D Longitudinal Strain LV PW:         1.10 cm     2D Strain GLS (A4C):   -17.1 % LV IVS:        1.30 cm     2D Strain GLS (A3C):   -20.1 % LVOT diam:     2.20 cm     2D Strain GLS (A2C):   -17.7 % LV SV:         48          2D Strain GLS Avg:     -18.3 % LV SV Index:   23 LVOT Area:     3.80 cm LV IVRT:       50 msec  LV Volumes (MOD) LV vol d, MOD A4C: 66.6 ml LV vol s, MOD A4C: 21.3 ml LV SV MOD A4C:     66.6 ml IVC IVC diam: 2.50 cm LEFT ATRIUM             Index        RIGHT ATRIUM           Index LA diam:        4.00 cm 1.92 cm/m   RA Area:     19.80 cm LA Vol (A2C):   70.2 ml 33.64 ml/m  RA Volume:  62.50 ml  29.95 ml/m LA Vol (A4C):   70.6 ml 33.83 ml/m LA Biplane Vol: 76.0 ml 36.42 ml/m  AORTIC VALVE AV Area (Vmax):    2.61 cm AV Area (Vmean):   2.55 cm AV Area (VTI):     2.34 cm AV Vmax:           115.00 cm/s AV Vmean:          84.800 cm/s AV VTI:            0.206 m AV Peak Grad:      5.3 mmHg AV Mean Grad:      3.0 mmHg LVOT Vmax:         79.00 cm/s LVOT Vmean:        56.800 cm/s LVOT VTI:          0.127 m LVOT/AV VTI ratio: 0.62  AORTA Ao Root diam: 3.00 cm Ao Asc diam:  2.90 cm MITRAL VALVE               TRICUSPID VALVE MV Area (PHT): 3.00 cm    TR Peak grad:   32.3 mmHg MV Area VTI:   4.27 cm    TR Vmax:        284.00 cm/s MV Peak grad:  4.8 mmHg MV Mean grad:  3.0 mmHg    SHUNTS MV Vmax:       1.09 m/s    Systemic VTI:  0.13 m MV Vmean:      76.5 cm/s   Systemic Diam: 2.20 cm MV Decel Time: 253 msec MV E velocity: 70.20 cm/s Vinie Maxcy MD Electronically signed by Vinie Maxcy MD Signature Date/Time: 12/30/2024/4:21:54 PM    Final    DG Chest Port 1 View Result Date: 12/30/2024 CLINICAL DATA:  Shortness of breath. EXAM:  PORTABLE CHEST 1 VIEW COMPARISON:  December 28, 2024 FINDINGS: The cardiac silhouette is mildly enlarged and unchanged in size. Low lung volumes are noted. There is prominence of the pulmonary vasculature with mild diffusely increased interstitial lung markings. No focal consolidation, pleural effusion or pneumothorax is identified. The visualized skeletal structures are unremarkable. IMPRESSION: Cardiomegaly and low lung volumes with pulmonary vascular congestion and mild interstitial edema. Electronically Signed   By: Suzen Dials M.D.   On: 12/30/2024 12:59   US  ABDOMEN LIMITED RUQ (LIVER/GB) Result Date: 12/30/2024 EXAM: Right Upper Quadrant Abdominal Ultrasound TECHNIQUE: Real-time ultrasonography of the right upper quadrant of the abdomen was performed. COMPARISON: CT chest, abdomen, and pelvis 12/26/2024. CLINICAL HISTORY: 74 year old male with elevated liver enzymes. FINDINGS: LIVER: The liver demonstrates normal echogenicity. No intrahepatic biliary ductal dilatation. No mass. BILIARY SYSTEM: Gallbladder appears mildly distended and also thick walled (6 mm in thickness image 5). No sludge or stones identified within the lumen. No pericholecystic fluid. No sonographic Beverley sign is elicited. Common bile duct is within normal limits measuring 3 mm. OTHER: Right kidney is partially visible, negative. No right upper quadrant ascites. IMPRESSION: 1. Nonspecific gallbladder wall thickening (6 mm). Given absence of sonographic Beverley sign favor this is reactive / secondary rather than related to acalculus cholecystitis. 2. Negative liver ultrasound appearance of the liver. Electronically signed by: Helayne Hurst MD 12/30/2024 11:34 AM EST RP Workstation: [id number]    Cardiac Studies Echo 11/14/24:  1. Left ventricular ejection fraction, by estimation, is 55 to 60%. The  left ventricle has normal function. The left ventricle has no regional  wall motion abnormalities. There is mild concentric left  ventricular  hypertrophy. Left  ventricular diastolic  parameters are consistent with Grade I diastolic dysfunction (impaired  relaxation).   2. Right ventricular systolic function is normal. The right ventricular  size is normal. Tricuspid regurgitation signal is inadequate for assessing  PA pressure.   3. The mitral valve is normal in structure. No evidence of mitral valve  regurgitation. No evidence of mitral stenosis.   4. The aortic valve is normal in structure. Aortic valve regurgitation is  not visualized. No aortic stenosis is present.   5. The inferior vena cava is normal in size with greater than 50%  respiratory variability, suggesting right atrial pressure of 3 mmHg.   Echo 12/30/24: IMPRESSIONS    1. Limited echo   2. Right ventricular systolic function is normal. The right ventricular  size is normal. There is moderately elevated pulmonary artery systolic  pressure. The estimated right ventricular systolic pressure is 47.3 mmHg.   3. Left ventricular ejection fraction, by estimation, is 65 to 70%. The  left ventricle has normal function. The left ventricle has no regional  wall motion abnormalities. There is mild left ventricular hypertrophy. The  average left ventricular global  longitudinal strain is -18.3 %. The global longitudinal strain is mildly  abnormal along the septum, but no evidence for amyloid pattern.   4. Left atrial size was mildly dilated.   5. The aortic valve is tricuspid. Aortic valve regurgitation is not  visualized. No aortic stenosis is present.   6. The inferior vena cava is dilated in size with <50% respiratory  variability, suggesting right atrial pressure of 15 mmHg.    Patient Profile   Mr. Poteete is a 46M with HFpEF, hypertension, hyperlipidemia, recurrent strokes, severe OSA intolerant of CPAP, PAF, spinal stenosis, CKD 3a, and aortic atherosclerosis admitted with encephalopathy.  Cardiology consulted for volume overload, hypotension, and  hyponatremia.   Assessment & Plan   # HFpEF:  # Sepsis:  Volume status is very interesting.  He has significant hypernatremia suggesting intravascular volume depletion.  However his echocardiogram, which I personally reviewed and agree with the interpretation by the reading physician, systolic function is normal, therefore he does not have systolic heart failure.  Right atrial pressures 15 mmHg suggesting volume overload.  RA pressure could be 8, as I doubt he was able to participate in the request to take a deep breath in with prompting.  Right ventricular function was normal. GLS did not suggest cardiac amyloidosis.  He is warm on exam and I think his picture is most consistent with sepsis and multiorgan failure.  Would not diurese him.  Continue with free water supplementation given his improving hypernatramia and lack of a supplemental oxygen requirement.  Discussed goals of care with his family yesterday and they are not interested in any right heart catheterization or other invasive procedures.  # PAF:  Getting therapeutic Lovenox  given that he cannot take oral medicines at this time.  Rate is controlled off nodal agents.  He converted to sinus rhythm.    # OSA: Has not tolerated CPAP.  # Hypertension:  Home amlodipine , losartan , and hydralazine  on hold.  # CVA:  # Hyperlipidemia:  Eliquis , aspirin , and atorvastatin  currently on hold given that he is encephalopathic.  Management per primary team: # Sepsis: # Encephalopathy:  # Elevated LFTs:    For questions or updates, please contact [facility] Please consult [email or web address] for contact info under      CRITICAL CARE TIME Performed by: Annabella Scarce, MD   Total critical care  time: 45 minutes  Critical care time was exclusive of separately billable procedures and treating other patients.  Critical care was necessary to treat or prevent imminent or life-threatening deterioration.  Critical care was time spent  personally by me on the following activities: development of treatment plan with patient and/or surrogate as well as nursing, discussions with consultants, evaluation of patient's response to treatment, examination of patient, obtaining history from patient or surrogate, ordering and performing treatments and interventions, ordering and review of laboratory studies, ordering and review of radiographic studies, pulse oximetry and re-evaluation of patient's condition.   Signed, Annabella Scarce, MD  12/31/2024, 10:22 AM    "

## 2024-12-31 NOTE — Progress Notes (Signed)
 NEUROLOGY CONSULT FOLLOW UP NOTE   Date of service: December 31, 2024 Patient Name: Raymond Gordon MRN:  [id number] DOB:  09/22/1951  Interval Hx/subjective   Neurology called back due to patient mental status declining  NO family at the bedside. Patient is somnolent. He is not following commands or speaking, just some groaning.  Labs this am: K 3.0, BUN 45, CR 2.57, no leukocytosis, febrile today at 101.1  Vitals   Vitals:   12/31/24 0826 12/31/24 1000 12/31/24 1015 12/31/24 1159  BP:      Pulse:    83  Resp:    (!) 24  Temp:  (!) 100.8 F (38.2 C)    TempSrc:  Axillary    SpO2: 100%  100% 98%  Weight:      Height:         Body mass index is 39.96 kg/m.  Physical Exam   Constitutional: acutely ill elderly african american male  Psych: Affect appropriate to situation.   Eyes: No scleral injection.   HENT: No OP obstrucion.   Head: Normocephalic.   Cardiovascular: Normal rate and regular rhythm.   Respiratory: Effort normal, non-labored breathing.   GI: Soft.  No distension. There is no tenderness.   Skin: WDI.    Neurologic Examination   Mental Status -  Patient is somnolent, eyes are closed, does not open eyes to voice or stimulation. Does not follow commands, no verbalizing  Cranial Nerves II - XII - II - Visual field intact blinks to threat bilaterally III, IV, VI - roving eye movements.  V - Facial sensation intact bilaterally . VII - Facial movement intact bilaterally . VIII - Hearing & vestibular intact bilaterally . X -XI - XII - Unable to assess   Motor Strength - generalized weakness, bilateral uppers antigravity with drift, equal, bilateral lowers withdrawal to noxious stimuli  Motor Tone - Muscle tone was assessed at the neck and appendages and was normal . Sensory - responds to noxious stimuli   Coordination - Unable to assess   Gait and Station - deferred.  Medications Current Medications[1]  Labs and Diagnostic Imaging   CBC:  Recent Labs   Lab 12/26/24 2104 12/26/24 2113 12/28/24 0246 12/29/24 0256 12/30/24 0218 12/31/24 0322  WBC 5.9   < > 5.4   < > 8.5 8.0  NEUTROABS 4.7  --  3.7  --   --   --   HGB 11.4*   < > 9.5*   < > 10.0* 9.9*  HCT 36.9*   < > 30.3*   < > 31.9* 30.8*  MCV 91.8   < > 91.0   < > 90.1 88.5  PLT 165   < > 158   < > 178 183   < > = values in this interval not displayed.    Basic Metabolic Panel:  Lab Results  Component Value Date   NA 141 12/31/2024   K 3.0 (L) 12/31/2024   CO2 17 (L) 12/31/2024   GLUCOSE 283 (H) 12/31/2024   BUN 45 (H) 12/31/2024   CREATININE 2.57 (H) 12/31/2024   CALCIUM  8.8 (L) 12/31/2024   GFRNONAA 25 (L) 12/31/2024   GFRAA >60 08/21/2015   Lipid Panel:  Lab Results  Component Value Date   LDLCALC 76 11/13/2024   HgbA1c:  Lab Results  Component Value Date   HGBA1C 9.2 (H) 11/13/2024   Urine Drug Screen:     Component Value Date/Time   LABOPIA NEGATIVE 11/12/2024 2010  COCAINSCRNUR NEGATIVE 11/12/2024 2010   LABBENZ NEGATIVE 11/12/2024 2010   AMPHETMU NEGATIVE 11/12/2024 2010   THCU NEGATIVE 11/12/2024 2010   LABBARB NEGATIVE 11/12/2024 2010    Alcohol  Level     Component Value Date/Time   ETH <15 11/12/2024 1858   INR  Lab Results  Component Value Date   INR 1.2 11/12/2024   APTT  Lab Results  Component Value Date   APTT 40 (H) 11/12/2024   AED levels: No results found for: PHENYTOIN, ZONISAMIDE, LAMOTRIGINE, LEVETIRACETA  CT Head without contrast(Personally reviewed):  1. No acute intracranial abnormality. 2. ASPECTS 10. 3. Remote lacunar infarct in the right thalamus, suspected additional remote lacunar infarct in the left thalamus, and remote infarct in the right cerebellum  CT angio Head and Neck with contrast(Personally reviewed): No LVO   MRI Brain(Personally reviewed): No acute infarct  Mild chronic white matter abnormalities and old lacunar infarct in the right cerebellum.    rEEG:  Intermittent slow,  generalized   IMPRESSION: This study is suggestive of mild generalized cerebral dysfunction ( encephalopathy). No seizures or epileptiform discharges were seen throughout the recording.  Labs Ammonia 24 Procal 1.13 Lactic 1.1  Assessment   Raymond Gordon is a 74 y.o. male  A fib on Eliquis , DM, CHF, HTN, HLD, obesity and OSA who presented to [facility] for evaluation of SOB and altered MS on 2/1. Patient was noted to be hypoxic, hypothermic and with soft BP's. Per RN his exam has been fluctuating. Code stroke was activated for acute onset of left facial droop, left side weakness and weak left grip. LKW 0800. CT head with no acute process   My suspicion is that his mental status is worsened in the setting of his multiorgan systemic failure.  He has had fevers, and so there was some discussion of LP, however his mental status has been gradually worsening over the past 2 weeks, he has not complained of headaches, and I have very low suspicion for CNS infection.  An LP at this point would put him at significant risk given his INR of 1.9 without any type of anticoagulation (more than 48 hours since his last dose of Eliquis ).  Given how low the likelihood is that he has some CNS infection, I think it is unlikely to be beneficial to him.  If it were to be considered, would pursue with radiology guidance to minimize risk from his coagulopathic state.  If his medical condition improves, would expect his delirium to gradually improve over time.  No specific further investigations from a neurological standpoint at this time.  Recommendations  - Delirium precautions  - continue to correct metabolic derangements per primary team  - continue to treat and evaluate for underlying medical issues per primary team  ______________________________________________________________________   Signed, Karna DELENA Geralds, NP [facility]   I have seen the patient and made edits to the above note.  He has an  encephalopathy which is likely multifactorial and metabolic in nature.    [1]  Current Facility-Administered Medications:    arformoterol  (BROVANA ) nebulizer solution 15 mcg, 15 mcg, Nebulization, BID, Kc, Ramesh, MD, 15 mcg at 12/31/24 0713   budesonide  (PULMICORT ) nebulizer solution 0.25 mg, 0.25 mg, Nebulization, BID, Kc, Ramesh, MD, 0.25 mg at 12/31/24 9286   cefTRIAXone  (ROCEPHIN ) 2 g in sodium chloride  0.9 % 100 mL IVPB, 2 g, Intravenous, Q24H, Kc, Ramesh, MD, Stopped at 12/30/24 1236   Chlorhexidine  Gluconate Cloth 2 % PADS 6 each, 6 each,  Topical, Daily, Kc, Ramesh, MD, 6 each at 12/31/24 1000   dextrose  5 % solution, , Intravenous, Continuous, Kc, Ramesh, MD, Last Rate: 50 mL/hr at 12/31/24 1141, New Bag at 12/31/24 1141   doxycycline  (VIBRAMYCIN ) 100 mg in sodium chloride  0.9 % 250 mL IVPB, 100 mg, Intravenous, Q12H, Kc, Ramesh, MD   [START ON 01/01/2025] enoxaparin  (LOVENOX ) injection 100 mg, 100 mg, Subcutaneous, Q24H, Hurth, Kimberly P, RPH   insulin  aspart (novoLOG ) injection 0-6 Units, 0-6 Units, Subcutaneous, TID WC, Garba, Mohammad L, MD   pantoprazole  (PROTONIX ) injection 40 mg, 40 mg, Intravenous, Q24H, Kc, Ramesh, MD, 40 mg at 12/31/24 0808   potassium chloride  10 mEq in 100 mL IVPB, 10 mEq, Intravenous, Q1 Hr x 4, Kc, Ramesh, MD   revefenacin  (YUPELRI ) nebulizer solution 175 mcg, 175 mcg, Nebulization, Daily, Kc, Ramesh, MD, 175 mcg at 12/31/24 [phone]

## 2024-12-31 NOTE — TOC Progression Note (Signed)
 Transition of Care [facility]) - Progression Note    Patient Details  Name: Raymond Gordon MRN: [id number] Date of Birth: February 19, 1951  Transition of Care [facility] CM/SW Contact  Luise [name], [place] Phone Number: 12/31/2024, 10:13 AM  Clinical Narrative:   Per progression patient is NPO. Palliative following. CSW will continue to follow for medical readiness.   Expected Discharge Plan: Skilled Nursing Facility Barriers to Discharge: Continued Medical Work up, SNF Pending bed offer, English As A Second Language Teacher               Expected Discharge Plan and Services In-house Referral: Clinical Social Work   Post Acute Care Choice: Skilled Nursing Facility Living arrangements for the past 2 months: Single Family Home                                       Social Drivers of Health (SDOH) Interventions SDOH Screenings   Food Insecurity: No Food Insecurity (12/28/2024)  Housing: High Risk (12/28/2024)  Transportation Needs: Unmet Transportation Needs (12/28/2024)  Utilities: Not At Risk (12/28/2024)  Social Connections: Moderately Isolated (12/28/2024)  Tobacco Use: Medium Risk (12/26/2024)    Readmission Risk Interventions     No data to display

## 2024-12-31 NOTE — Progress Notes (Signed)
 Occupational Therapy Treatment Patient Details Name: Raymond Gordon MRN: [id number] DOB: November 02, 1951 Today's Date: 12/31/2024   History of present illness Patient is a 74 yo male presenting to [facility] on 2/1 due to hypothermia, shortness of breath and acute metabolic encephalopathy. Code stroke activated 2/1, MRI clear. EEG showing cerebral dysfunction. 2/5 CXR showed cardiomegaly and low lung volumes with pulmonary vascular congestion and mild interstitial edema. PMHx: DMII, HLD, gout, afib, HTN, CHF, OSA, CKDIII, anemia, dementia, chronic L sided weakness, BPH with chronic indwelling foley catheter.   OT comments  Pt received semi reclined in bed with eyes closed, pt limited by lethargy this session. Provided max multimodal cues/stimulus to attempt to arouse pt with no success. Attempted to transition pt from supine>sit with total A +2 however pt not making any efforts to arouse or participate therefore terminated task for safety. Patient will benefit from continued inpatient follow up therapy, <3 hours/day       If plan is discharge home, recommend the following:  Two people to help with walking and/or transfers;Two people to help with bathing/dressing/bathroom;Assistance with cooking/housework;Assistance with feeding;Direct supervision/assist for medications management;Direct supervision/assist for financial management;Assist for transportation;Help with stairs or ramp for entrance;Supervision due to cognitive status   Equipment Recommendations  Other (comment) (defer)    Recommendations for Other Services      Precautions / Restrictions Precautions Precautions: Fall Recall of Precautions/Restrictions: Impaired Precaution/Restrictions Comments: watch HR and O2 Restrictions Weight Bearing Restrictions Per Provider Order: No       Mobility Bed Mobility               General bed mobility comments: attempted to transition pt into sitting however pt not participating making transfer  unsafe, terminated task for safety    Transfers                   General transfer comment: deferred 2/2 level of alertness     Balance                                           ADL either performed or assessed with clinical judgement   ADL                                         General ADL Comments: pt unable to complete any ADLS d/t impaired level of arousal, pt kept eyes closed during session, no response to any stimulus    Extremity/Trunk Assessment Upper Extremity Assessment Upper Extremity Assessment: Generalized weakness (difficult to assess d/t impaired level of arousal)   Lower Extremity Assessment Lower Extremity Assessment: Defer to PT evaluation   Cervical / Trunk Assessment Cervical / Trunk Assessment: Normal    Vision   Additional Comments: unable to assess vision d/t impaired level of arousal   Perception Perception Perception: Not tested   Praxis Praxis Praxis: Not tested   Communication Communication Communication: Impaired Factors Affecting Communication: Reduced clarity of speech;Difficulty expressing self   Cognition Arousal: Obtunded Behavior During Therapy: Flat affect               OT - Cognition Comments: unable to follow any commands, eyes closed throughout session, did not arouse to any stimulus  Following commands: Impaired Following commands impaired:  (not following commands)      Cueing   Cueing Techniques: Verbal cues, Gestural cues, Tactile cues, Visual cues  Exercises      Shoulder Instructions       General Comments VSS on RA    Pertinent Vitals/ Pain       Pain Assessment Pain Assessment: Faces Faces Pain Scale: No hurt  Home Living                                          Prior Functioning/Environment              Frequency  Min 2X/week        Progress Toward Goals  OT Goals(current goals can now be found in  the care plan section)  Progress towards OT goals: Not progressing toward goals - comment (limited by level of arousal)  Acute Rehab OT Goals OT Goal Formulation: Patient unable to participate in goal setting Time For Goal Achievement: 01/12/25 Potential to Achieve Goals: Poor  Plan      Co-evaluation                 AM-PAC OT 6 Clicks Daily Activity     Outcome Measure   Help from another person eating meals?: Total Help from another person taking care of personal grooming?: Total Help from another person toileting, which includes using toliet, bedpan, or urinal?: Total Help from another person bathing (including washing, rinsing, drying)?: Total Help from another person to put on and taking off regular upper body clothing?: Total Help from another person to put on and taking off regular lower body clothing?: Total 6 Click Score: 6    End of Session    OT Visit Diagnosis: Muscle weakness (generalized) (M62.81);Adult, failure to thrive (R62.7);Other abnormalities of gait and mobility (R26.89);Unsteadiness on feet (R26.81);Other symptoms and signs involving cognitive function   Activity Tolerance Patient limited by lethargy   Patient Left in bed;with call bell/phone within reach;with chair alarm set   Nurse Communication Mobility status        Time: 8841-8782 OT Time Calculation (min): 19 min  Charges: OT General Charges $OT Visit: 1 Visit OT Treatments $Self Care/Home Management : 8-22 mins  Ronal Mallie POUR., COTA/L Acute Rehabilitation Services [phone]   Ronal Mallie Needy 12/31/2024, 12:30 PM

## 2024-12-31 NOTE — Progress Notes (Addendum)
" °  IR BRIEF PROGRESS NOTE:  IR was requested for LP due to AMS. No bedside LP was performed secondary to concerns with recent Lovenox  administration. After discussion with care team concerning patient agitation and need for mild sedation (2 mg Ativan  requested by IR), Dr. Michaela has requested to hold the request for LP for now. IR will hold the order over the weekend. Please be advised that IR APPs have limited availability for Fluoroscopy guided study and procedures over weekends due to staffing constraints.   ADDENDUM: Order for LP was deleted by care team.  Electronically Signed: Carlin DELENA Griffon, PA-C 12/31/2024, 1:37 PM      "

## 2024-12-31 NOTE — Consult Note (Signed)
 Nephrology Consult   Requesting provider: Mennie Lamy, MD Service requesting consult: [facility] Reason for consult: AKI, Hypernatremia  Assessment/Recommendations: Raymond Gordon is a/an 74 y.o. male with a past medical history A-fib on Eliquis , CHF, HTN, T2DM, OSA who present w/ shortness of breath and encephalopathy and hypothermia with concern for sepsis.  Code stroke was called, but CT and MRI with no indication for stroke, but CTA with chronic/unchanged ischemic changes.  Difficult to determine cause of his AKI as it could be multifactorial secondary to multifactorial issues causing tubular injury and maybe contrast-induced nephropathy as well.  Will check CK.  Echo demonstrated normal EF and LV function, however elevated RAP to 15 mmHg, and IVC dilated < 50% respiratory variability, kidney injury could also be secondary to cardiogenic shock.   His presentation is likely secondary to sepsis and multiorgan failure, he seems to be progressively declining. Agree with family to avoid invasive measures at this time and agree with DNR status. Appreciate palliative's participation to aid in discussions with determining goals of care, will continue to communicate patient's decline and discuss next steps.   AKI Renal function worsened from admission (Cr 1.04 > 2.57). Likely multifactorial. - Continue hydration with D5 - Chart reviewed: (medications acceptable, does not appear to have been exposed to nephrotoxins with imaging or had episodes of significant hypotension).   - Continue to monitor daily Cr, Dose meds for GFR - Monitor Daily I/Os, Daily weight  - Maintain MAP>65 for optimal renal perfusion.  - Avoid nephrotoxic medications including NSAIDs - Use synthetic opioids (Fentanyl /Dilaudid) if needed  Hypernatremia Initially 150 on admission, down-trended to 141 today.  Likely some intravascular depletion, however Echo demonstrated elevated right atrial pressures suggesting volume  overload.  - Continue D5 water - Monitor with BMP  Hyperkalemia Potassium initially stable, but mildly low at 3.0.  - Supplemented - Monitor with BMP  Acute Metabolic Encephalopathy  AHRF Dementia Likely multifactorial with history of dementia.  Per family, this is an acute change from his baseline as described in conversation below.  - Continue delirium precautions - Avoid sedatives  Sepsis Hypothermia has improved. Continues to be febrile.  - On Rocephin  and Doxycycline   HFpEF PAF Appears mildly hypervolemic on exam, especially in feet/ankles.  Appreciate cardiology's input and assistance with patient's care.  - Avoid diuresis and continuing free water supplementation - Lovenox  in place of Eliquis  due to inability to take oral meds at t his time.   Hypertension Stable.  Home meds on hold (Amlodipine , Losartan , and Hydralazine )  Uncontrolled T2DM A1c 9.2 one month ago.  Currently NPO d/t mentation and on vsSSI.  Transaminitis AST and ALT uptrending. GI following, appreciate assistance.   Recommendations conveyed to primary service.   [facility] 12/31/2024 12:39 PM _____________________________________________________________________________________ CC: Shortness of breath  History of Present Illness: Raymond Gordon is a/an 74 y.o. male with a past medical history of A-fib with RVR, CHF, HLD, obesity, and OSA with L-sided chronic weakness for several months, and dementia w/ sundowning who presents with SOB, hypoxia, and AMS.   In the ED patient was found to by hypoxic, hypothermic, and hypotensive.  Patient is minimally responsive and was unable to provide history.  Patient was hypoxic requiring 4 L Echo, this has improved to 1 to 2 L [place].    Labs demonstrated hypernatremia (150) now improved to 141, mild metabolic acidosis, stable renal function (baseline 0.8-1.1), but now worsened to Cr 2.57, transaminitis (AST 96>820 and ALT 257 >  515).  EEG demonstrated mild generalized cerebral dysfunction (encephalopathy).  No seizures or epileptiform discharges. No acute findings noted on CT or MRI, but chronic occlusions/stenoses.  Previously admitted 11/12/24 - 11/30/24 for stroke-like symptoms, AMS, and treated for dementia w/ sundowning.  At that time EEG, MRI demonstrated no signs of acute stroke, just chronic ischemic changes.  Patient was minimally responsive this morning to voice, opened his eyes minimally and shared his name when requested.  Febrile overnight and currently to 100.8, mildly tachycardic, and tachypneic requiring 1-2 L [place], and stable BP.   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 Last week reported being really cold and confused and progressively worsened. He lost ability to feed himself and started gurgling abruptly. No known inciting event for this. Prior to all this he was very communicative and able to answer questions, she herself said he was AAOx4 and needed some help with ADLs, but was able to do some things on his own. She also shared that he used to void more regularly even after the prostate artery embolization, so this is likely a new change in his urinary function as well. As the palliative note conveyed, I also confirmed that they would not want a feeding tube, transfer to ICU, and do not want to undergo a heart cath or other very invasive procedures. Otherwise they are open to continued medical management, noninvasive diagnostics and treatments. Asked if HD would be something they would consider and they would be ok with it short term if it would help him get better (which I discussed we couldn't be certain about), but they wouldn't want him to do this long term. I think she is knowledgeable about his baseline, medical  history, and is understanding of his decline and would be open to more palliative discussions.   Medications:  Current Facility-Administered Medications  Medication Dose Route Frequency Provider Last Rate Last Admin   arformoterol  (BROVANA ) nebulizer solution 15 mcg  15 mcg Nebulization BID Kc, Ramesh, MD   15 mcg at 12/31/24 9286   budesonide  (PULMICORT ) nebulizer solution 0.25 mg  0.25 mg Nebulization BID Kc, Mennie, MD   0.25 mg at 12/31/24 9286   cefTRIAXone  (ROCEPHIN ) 2 g in sodium chloride  0.9 % 100 mL IVPB  2 g Intravenous Q24H Kc, Mennie, MD   Stopped at 12/30/24 1236   Chlorhexidine  Gluconate Cloth 2 % PADS 6 each  6 each Topical Daily Kc, Mennie, MD   6 each at 12/31/24 1000   dextrose  5 % solution   Intravenous Continuous Kc, Ramesh, MD 50 mL/hr at 12/31/24 1141 New Bag at 12/31/24 1141   doxycycline  (VIBRAMYCIN ) 100 mg in sodium chloride  0.9 % 250 mL IVPB  100 mg Intravenous Q12H Christobal Mennie, MD       [START ON 01/01/2025] enoxaparin  (LOVENOX ) injection 100 mg  100 mg Subcutaneous Q24H Hurth, Kimberly P, RPH       insulin  aspart (novoLOG ) injection 0-6 Units  0-6 Units Subcutaneous TID WC Garba, Mohammad L, MD       pantoprazole  (PROTONIX ) injection 40 mg  40 mg Intravenous Q24H Kc, Ramesh, MD   40 mg at 12/31/24 0808   potassium chloride  10 mEq in 100 mL IVPB  10 mEq Intravenous Q1 Hr x 4 Kc, Ramesh, MD       revefenacin  (YUPELRI )  nebulizer solution 175 mcg  175 mcg Nebulization Daily Christobal [place], MD   175 mcg at 12/31/24 9286     ALLERGIES Patient has no known allergies.  MEDICAL HISTORY Past Medical History:  Diagnosis Date   Atrial fibrillation with RVR (HCC) 07/08/2015   Bilateral lower extremity edema    Chronic diastolic CHF (congestive heart failure) (HCC) 08/15/2015   a. 06/2015: echo showing EF of 65-70% with Grade 2 DD   Diabetes (HCC)    INSULIN  DEPENDENT   Dysrhythmia    Hyperglycemia due to type 2 diabetes mellitus (HCC) 04/11/2021   Hyperlipidemia     Hypertension    Hypoglycemia 05/20/2014   Obesity    Obesity    Paroxysmal atrial fibrillation (HCC)    Sleep apnea      SOCIAL HISTORY Social History   Socioeconomic History   Marital status: Single    Spouse name: Not on file   Number of children: Not on file   Years of education: Not on file   Highest education level: Not on file  Occupational History   Not on file  Tobacco Use   Smoking status: Former    Current packs/day: 0.00    Types: Cigarettes    Quit date: 11/25/1997    Years since quitting: 27.1   Smokeless tobacco: Former    Quit date: 06/14/2015  Vaping Use   Vaping status: Never Used  Substance and Sexual Activity   Alcohol  use: No    Alcohol /week: 0.0 standard drinks of alcohol    Drug use: No   Sexual activity: Not on file  Other Topics Concern   Not on file  Social History Narrative   Not on file   Social Drivers of Health   Tobacco Use: Medium Risk (12/26/2024)   Patient History    Smoking Tobacco Use: Former    Smokeless Tobacco Use: Former    Passive Exposure: Not on Actuary Strain: Not on file  Food Insecurity: No Food Insecurity (12/28/2024)   Epic    Worried About Programme Researcher, Broadcasting/film/video in the Last Year: Never true    Ran Out of Food in the Last Year: Never true  Transportation Needs: Unmet Transportation Needs (12/28/2024)   Epic    Lack of Transportation (Medical): Yes    Lack of Transportation (Non-Medical): Yes  Physical Activity: Not on file  Stress: Not on file  Social Connections: Moderately Isolated (12/28/2024)   Social Connection and Isolation Panel    Frequency of Communication with Friends and Family: Never    Frequency of Social Gatherings with Friends and Family: Twice a week    Attends Religious Services: More than 4 times per year    Active Member of [employer] or Organizations: Yes    Attends Banker Meetings: More than 4 times per year    Marital Status: Divorced  Intimate Partner Violence: Not At Risk  (12/28/2024)   Epic    Fear of Current or Ex-Partner: No    Emotionally Abused: No    Physically Abused: No    Sexually Abused: No  Depression (PHQ2-9): Not on file  Alcohol  Screen: Not on file  Housing: High Risk (12/28/2024)   Epic    Unable to Pay for Housing in the Last Year: No    Number of Times Moved in the Last Year: 0    Homeless in the Last Year: Yes  Utilities: Not At Risk (12/28/2024)   Epic    Threatened with loss  of utilities: No  Health Literacy: Not on file     FAMILY HISTORY Family History  Problem Relation Age of Onset   Heart failure Brother    Hyperlipidemia Brother    Hypertension Brother    Stroke Brother    Hypertension Mother    Hyperlipidemia Mother    Diabetes Mother    Cancer Father        prostate   GER disease Father    Stroke Father    Hypertension Sister    Hyperlipidemia Sister    Hypertension Maternal Grandmother    Stroke Maternal Grandmother    Diabetes Maternal Grandfather    Hypertension Maternal Grandfather       Review of Systems: 12 systems reviewed Otherwise as per HPI, all other systems reviewed and negative  Physical Exam: Vitals:   12/31/24 1015 12/31/24 1159  BP:    Pulse:  83  Resp:  (!) 24  Temp:    SpO2: 100% 98%   No intake/output data recorded.  Intake/Output Summary (Last 24 hours) at 12/31/2024 1239 Last data filed at 12/31/2024 0500 Gross per 24 hour  Intake 1119.04 ml  Output 330 ml  Net 789.04 ml   General: ill-appearing HEENT: anicteric sclera, xerostomia, and poor oral hygiene CV: regular rate, normal rhythm, no murmurs, no gallops, no rubs, 1+ b/l foot and ankle edema Lungs: clear to auscultation bilaterally, on 1 L [place] Abd: soft, non-tender, non-distended Skin: Icthyosis vulgaris in LE bilaterally Musculoskeletal: no obvious deformities Neuro: minimally responsive to voice, more responsive to sternal rub  Test Results Reviewed Lab Results  Component Value Date   NA 141 12/31/2024   K 3.0 (L)  12/31/2024   CL 110 12/31/2024   CO2 17 (L) 12/31/2024   BUN 45 (H) 12/31/2024   CREATININE 2.57 (H) 12/31/2024   CALCIUM  8.8 (L) 12/31/2024   ALBUMIN 2.8 (L) 12/31/2024   PHOS 2.7 12/28/2024    CBC Recent Labs  Lab 12/26/24 2104 12/26/24 2113 12/28/24 0246 12/29/24 0256 12/30/24 0218 12/31/24 0322  WBC 5.9   < > 5.4 6.4 8.5 8.0  NEUTROABS 4.7  --  3.7  --   --   --   HGB 11.4*   < > 9.5* 10.0* 10.0* 9.9*  HCT 36.9*   < > 30.3* 32.2* 31.9* 30.8*  MCV 91.8   < > 91.0 91.7 90.1 88.5  PLT 165   < > 158 165 178 183   < > = values in this interval not displayed.    I have reviewed all relevant outside healthcare records related to the patient's current hospitalization

## 2024-12-31 NOTE — Progress Notes (Signed)
 This chaplain attempted spiritual care. The Pt. is participating in patient care at time of visit. Revisit planned.  Chaplain Leeroy Hummer [phone]

## 2024-12-31 NOTE — Consult Note (Addendum)
 "   [facility]  Total days of antibiotics 6       Reason for Consult:fevers of unknown origin    Referring Physician: kc ramesh  Principal Problem:   Hypothermia Active Problems:   PAF (paroxysmal atrial fibrillation) (HCC)   Essential hypertension   Type 2 diabetes mellitus with hyperlipidemia (HCC)   Severe OSA (obstructive sleep apnea)   Chronic diastolic heart failure (HCC)   Obesity, class 3 (HCC)   Hypernatremia   Altered mental status   Acute on chronic HFrEF (heart failure with reduced ejection fraction) (HCC)   Sepsis (HCC)    HPI: Raymond Gordon is a 74 y.o. male with history of AFib with RVR, chronic lower extremity edema/lymphedema, severe OSA, T2DM, CKD, chronic diastolic failure, admitted on 2/1 with shortness of breath and encephalopathy. Initially found to have some hypoxia at 88% on room air, hypothermia, as well as mildly elevated LFT. He was started on broad spectrum abtx as they underwent work up. Chest/A/P was non revealing-bilatera atelectesis but no infiltrate, UA no pyuria,NCHCT showing signs of remote lacunar infarct. MRI of brain without contrast showing no acute infarct. Initial blood cx ngtd.  Over the next few days, his transaminitis continued to worsen, but ammonia 400-800s, suspect congestive hepatopathy due to right sided heart failure, in the setting of elevated BNP of 1200. In the last 48hrs, starting to have fever of 100.4-100.8, and remains heavily solomnent. IR unable to due LP safely since recently on enoxaparin . Repeat cxr per my read suggest pulmonary congestion/edema, fluid overload. He has been on vanco/cefepime  x 2 days followed by amp/sub x 2 and now on 3rd day of ceftriaxone . Patient unable to answer questions for interview.    Past Medical History:  Diagnosis Date   Atrial fibrillation with RVR (HCC) 07/08/2015   Bilateral lower extremity edema    Chronic diastolic CHF (congestive heart failure) (HCC) 08/15/2015    a. 06/2015: echo showing EF of 65-70% with Grade 2 DD   Diabetes (HCC)    INSULIN  DEPENDENT   Dysrhythmia    Hyperglycemia due to type 2 diabetes mellitus (HCC) 04/11/2021   Hyperlipidemia    Hypertension    Hypoglycemia 05/20/2014   Obesity    Obesity    Paroxysmal atrial fibrillation (HCC)    Sleep apnea     Allergies: Allergies[1]  Current antibiotics:   MEDICATIONS:  arformoterol   15 mcg Nebulization BID   budesonide  (PULMICORT ) nebulizer solution  0.25 mg Nebulization BID   Chlorhexidine  Gluconate Cloth  6 each Topical Daily   [START ON 01/01/2025] enoxaparin  (LOVENOX ) injection  100 mg Subcutaneous Q24H   insulin  aspart  0-6 Units Subcutaneous TID WC   pantoprazole  (PROTONIX ) IV  40 mg Intravenous Q24H   revefenacin   175 mcg Nebulization Daily    Social History[2]  Family History  Problem Relation Age of Onset   Heart failure Brother    Hyperlipidemia Brother    Hypertension Brother    Stroke Brother    Hypertension Mother    Hyperlipidemia Mother    Diabetes Mother    Cancer Father        prostate   GER disease Father    Stroke Father    Hypertension Sister    Hyperlipidemia Sister    Hypertension Maternal Grandmother    Stroke Maternal Grandmother    Diabetes Maternal Grandfather    Hypertension Maternal Grandfather     Review of Systems -  Unable to obtain due to  being obtunded  OBJECTIVE: Temp:  [100.4 F (38 C)-101.1 F (38.4 C)] 100.6 F (38.1 C) (02/06 1540) Pulse Rate:  [83-119] 87 (02/06 1540) Resp:  [20-36] 20 (02/06 1540) BP: (109-143)/(46-86) 117/51 (02/06 1540) SpO2:  [90 %-100 %] 95 % (02/06 1540) FiO2 (%):  [32 %] 32 % (02/05 2018) Physical Exam  Constitutional:  He appears well-developed and well-nourished. No distress.  HENT:  Mouth/Throat: Oropharynx is clear and moist. No oropharyngeal exudate.  Cardiovascular: Normal rate, regular rhythm and normal heart sounds. Exam reveals no gallop and no friction rub.  No murmur heard.   Pulmonary/Chest: Effort normal and breath sounds normal. No respiratory distress. He has no wheezes.  Abdominal: Soft. Bowel sounds are normal. He exhibits no distension. There is no tenderness.  Lymphadenopathy:  He has no cervical adenopathy.  Ext: pitting edema Skin: Skin is warm and dry. No rash noted. No erythema. Heavy kerotic plaques to legs bilaterally   LABS: Results for orders placed or performed during the hospital encounter of 12/26/24 (from the past 48 hours)  Glucose, capillary     Status: Abnormal   Collection Time: 12/29/24 10:47 PM  Result Value Ref Range   Glucose-Capillary 139 (H) 70 - 99 mg/dL    Comment: Glucose reference range applies only to samples taken after fasting for at least 8 hours.  CBC     Status: Abnormal   Collection Time: 12/30/24  2:18 AM  Result Value Ref Range   WBC 8.5 4.0 - 10.5 K/uL   RBC 3.54 (L) 4.22 - 5.81 MIL/uL   Hemoglobin 10.0 (L) 13.0 - 17.0 g/dL   HCT 68.0 (L) 60.9 - 47.9 %   MCV 90.1 80.0 - 100.0 fL   MCH 28.2 26.0 - 34.0 pg   MCHC 31.3 30.0 - 36.0 g/dL   RDW 83.2 (H) 88.4 - 84.4 %   Platelets 178 150 - 400 K/uL   nRBC 3.4 (H) 0.0 - 0.2 %    Comment: Performed at [facility], [address], [place], [name] [address]  Comprehensive metabolic panel with GFR     Status: Abnormal   Collection Time: 12/30/24  2:18 AM  Result Value Ref Range   Sodium 151 (H) 135 - 145 mmol/L   Potassium 3.5 3.5 - 5.1 mmol/L   Chloride 117 (H) 98 - 111 mmol/L   CO2 23 22 - 32 mmol/L   Glucose, Bld 130 (H) 70 - 99 mg/dL    Comment: Glucose reference range applies only to samples taken after fasting for at least 8 hours.   BUN 32 (H) 8 - 23 mg/dL   Creatinine, Ser 8.48 (H) 0.61 - 1.24 mg/dL   Calcium  9.8 8.9 - 10.3 mg/dL   Total Protein 7.0 6.5 - 8.1 g/dL   Albumin 3.0 (L) 3.5 - 5.0 g/dL   AST 336 (H) 15 - 41 U/L   ALT 465 (H) 0 - 44 U/L   Alkaline Phosphatase 283 (H) 38 - 126 U/L   Total Bilirubin 0.3 0.0 - 1.2 mg/dL   GFR,  Estimated 48 (L) >60 mL/min    Comment: (NOTE) Calculated using the CKD-EPI Creatinine Equation (2021)    Anion gap 11 5 - 15    Comment: Performed at [facility], [address], [place], [name] [address]  Pro Brain natriuretic peptide     Status: Abnormal   Collection Time: 12/30/24  2:18 AM  Result Value Ref Range   Pro Brain Natriuretic Peptide  1,219.0 (H) <300.0 pg/mL    Comment: (NOTE) Age Group        Cut-Points    Interpretation  < 50 years     450 pg/mL       NT-proBNP > 450 pg/mL indicates                                ADHF is likely              50 to 75 years  900 pg/mL      NT-proBNP > 900 pg/mL indicates          ADHF is likely  > 75 years      1800 pg/mL     NT-proBNP > 1800 pg/mL indicates          ADHF is likely                           All ages    Results between       Indeterminate. Further clinical             300 and the cut-   information is needed to determine            point for age group   if ADHF is present.                                                             Elecsys proBNP II/ Elecsys proBNP II STAT           Cut-Point                       Interpretation  300 pg/mL                    NT-proBNP <300pg/mL indicates                             ADHF is not likely  Performed at [facility], [address], [place], [name] [address]   Glucose, capillary     Status: Abnormal   Collection Time: 12/30/24  5:36 AM  Result Value Ref Range   Glucose-Capillary 137 (H) 70 - 99 mg/dL    Comment: Glucose reference range applies only to samples taken after fasting for at least 8 hours.  Glucose, capillary     Status: Abnormal   Collection Time: 12/30/24 11:52 AM  Result Value Ref Range   Glucose-Capillary 148 (H) 70 - 99 mg/dL    Comment: Glucose reference range applies only to samples taken after fasting for at least 8 hours.  Lactic acid, plasma     Status: None   Collection Time: 12/30/24  1:38 PM  Result Value Ref Range    Lactic Acid, Venous 1.2 0.5 - 1.9 mmol/L    Comment: Performed at [facility], [address] [place], [name] [address]  Glucose, capillary     Status: Abnormal   Collection Time: 12/30/24  3:47 PM  Result Value Ref Range   Glucose-Capillary 122 (H) 70 - 99 mg/dL    Comment: Glucose reference range applies  only to samples taken after fasting for at least 8 hours.  Lactic acid, plasma     Status: None   Collection Time: 12/30/24  4:01 PM  Result Value Ref Range   Lactic Acid, Venous 1.1 0.5 - 1.9 mmol/L    Comment: Performed at [facility], [address], [place], [name] [address]  Cortisol, Random     Status: None   Collection Time: 12/30/24  4:01 PM  Result Value Ref Range   Cortisol, Plasma 16.3 ug/dL    Comment: (NOTE) AM    6.7 - 22.6 ug/dL PM   <89.9       ug/dL Performed at [facility], [address] [place], [name] [address]   Procalcitonin     Status: None   Collection Time: 12/30/24  4:01 PM  Result Value Ref Range   Procalcitonin 1.13 ng/mL    Comment: (NOTE)   Sepsis PCT Algorithm          Lower Respiratory Tract Infection                                         PCT Algorithm -----------------------------------------------------------------  <0.5 ng/mL                    <0.10 ng/mL  Associated with low           Antibiotic therapy strongly   risk for progression          discouraged. Indicates absence   to severe sepsis              of bacteria infection  and/or septic shock             --------------------------------------------------------------  0.5-2.0 ng/mL                 0.10-0.25 ng/mL  Recommended to retest         Antibiotic therapy discouraged.  PCT within 6-24 hours         Bacterial infection unlikely  ------------------------------------------------------------  >2 ng/mL                      0.26-0.50 ng/mL  Associated with high risk     Antibiotic therapy encouraged.  for progression to severe     Bacterial infection  possible  sepsis/and or septic shock    ------------------------------                                 >0.50 ng/mL                                Antibiotic therapy strongly                                 encouraged.                                Suggestive of presence of                                 bacterial infection.                                 -------------------------------------------------------------------  <  or = 0.50 ng/mL OR          < or = 0.25 OR 80% decrease in PCT  80% decrease in PCT           Antibiotic therapy   Antibiotic therapy may        may be discontinued  be discontinued                                 Performed at [facility], [address], [place], [name] [address]   Troponin T, High Sensitivity     Status: Abnormal   Collection Time: 12/30/24  4:01 PM  Result Value Ref Range   Troponin T High Sensitivity 86 (H) 0 - 19 ng/L    Comment: RESULT CALLED TO, READ BACK BY AND VERIFIED WITH: ASABRA MCCALLUM, RN @ [phone] 12/31/24 BY SEKDAHL DELTA CHECK NOTED (NOTE) Biotin concentrations > 1000 ng/mL falsely decrease TnT results.  Serial cardiac troponin measurements are suggested.  Refer to the Links section for chest pain algorithms and additional  guidance. Performed at [facility], [address], [place], [name] [address]   Ammonia     Status: None   Collection Time: 12/30/24  4:01 PM  Result Value Ref Range   Ammonia 24 9 - 35 umol/L    Comment: Performed at [facility], [address] [place], [name] [address]  Glucose, capillary     Status: Abnormal   Collection Time: 12/30/24 10:05 PM  Result Value Ref Range   Glucose-Capillary 145 (H) 70 - 99 mg/dL    Comment: Glucose reference range applies only to samples taken after fasting for at least 8 hours.  CBC     Status: Abnormal   Collection Time: 12/31/24  3:22 AM  Result Value Ref Range   WBC 8.0 4.0 - 10.5 K/uL    Comment: WHITE COUNT CONFIRMED ON SMEAR   RBC 3.48  (L) 4.22 - 5.81 MIL/uL   Hemoglobin 9.9 (L) 13.0 - 17.0 g/dL   HCT 69.1 (L) 60.9 - 47.9 %   MCV 88.5 80.0 - 100.0 fL   MCH 28.4 26.0 - 34.0 pg   MCHC 32.1 30.0 - 36.0 g/dL   RDW 83.4 (H) 88.4 - 84.4 %   Platelets 183 150 - 400 K/uL   nRBC 3.5 (H) 0.0 - 0.2 %    Comment: Performed at [facility], [address], [place], [name] [address]  Comprehensive metabolic panel with GFR     Status: Abnormal   Collection Time: 12/31/24  3:22 AM  Result Value Ref Range   Sodium 148 (H) 135 - 145 mmol/L   Potassium 3.5 3.5 - 5.1 mmol/L   Chloride 115 (H) 98 - 111 mmol/L   CO2 19 (L) 22 - 32 mmol/L   Glucose, Bld 141 (H) 70 - 99 mg/dL    Comment: Glucose reference range applies only to samples taken after fasting for at least 8 hours.   BUN 46 (H) 8 - 23 mg/dL   Creatinine, Ser 7.54 (H) 0.61 - 1.24 mg/dL   Calcium  11.9 (H) 8.9 - 10.3 mg/dL   Total Protein 7.1 6.5 - 8.1 g/dL   Albumin 2.8 (L) 3.5 - 5.0 g/dL   AST 179 (H) 15 - 41 U/L    Comment: HEMOLYSIS AT THIS LEVEL MAY AFFECT RESULT   ALT 515 (H) 0 - 44  U/L   Alkaline Phosphatase 404 (H) 38 - 126 U/L   Total Bilirubin 0.6 0.0 - 1.2 mg/dL   GFR, Estimated 27 (L) >60 mL/min    Comment: (NOTE) Calculated using the CKD-EPI Creatinine Equation (2021)    Anion gap 14 5 - 15    Comment: Performed at [facility], [address], [place], [name] [address]  Basic metabolic panel     Status: Abnormal   Collection Time: 12/31/24  8:49 AM  Result Value Ref Range   Sodium 141 135 - 145 mmol/L   Potassium 3.0 (L) 3.5 - 5.1 mmol/L   Chloride 110 98 - 111 mmol/L   CO2 17 (L) 22 - 32 mmol/L   Glucose, Bld 283 (H) 70 - 99 mg/dL    Comment: Glucose reference range applies only to samples taken after fasting for at least 8 hours.   BUN 45 (H) 8 - 23 mg/dL   Creatinine, Ser 7.42 (H) 0.61 - 1.24 mg/dL   Calcium  8.8 (L) 8.9 - 10.3 mg/dL    Comment: Delta Check Noted   GFR, Estimated 25 (L) >60 mL/min    Comment: (NOTE) Calculated using  the CKD-EPI Creatinine Equation (2021)    Anion gap 14 5 - 15    Comment: Performed at [facility], [address], [place], [name] [address]  CK     Status: Abnormal   Collection Time: 12/31/24  8:49 AM  Result Value Ref Range   Total CK 33 (L) 49 - 397 U/L    Comment: Performed at [facility], [address] [place], [name] [address]  Protime-INR     Status: Abnormal   Collection Time: 12/31/24 11:56 AM  Result Value Ref Range   Prothrombin Time 22.3 (H) 11.4 - 15.2 seconds   INR 1.9 (H) 0.8 - 1.2    Comment: (NOTE) INR goal varies based on device and disease states. Performed at [facility], [address] [place], [name] [address]   Glucose, capillary     Status: Abnormal   Collection Time: 12/31/24 12:19 PM  Result Value Ref Range   Glucose-Capillary 143 (H) 70 - 99 mg/dL    Comment: Glucose reference range applies only to samples taken after fasting for at least 8 hours.  Comprehensive metabolic panel     Status: Abnormal   Collection Time: 12/31/24  2:25 PM  Result Value Ref Range   Sodium 147 (H) 135 - 145 mmol/L   Potassium 3.3 (L) 3.5 - 5.1 mmol/L   Chloride 116 (H) 98 - 111 mmol/L   CO2 16 (L) 22 - 32 mmol/L   Glucose, Bld 131 (H) 70 - 99 mg/dL    Comment: Glucose reference range applies only to samples taken after fasting for at least 8 hours.   BUN 47 (H) 8 - 23 mg/dL   Creatinine, Ser 7.26 (H) 0.61 - 1.24 mg/dL   Calcium  8.8 (L) 8.9 - 10.3 mg/dL   Total Protein 6.6 6.5 - 8.1 g/dL   Albumin 2.4 (L) 3.5 - 5.0 g/dL   AST 453 (H) 15 - 41 U/L   ALT 410 (H) 0 - 44 U/L   Alkaline Phosphatase 369 (H) 38 - 126 U/L   Total Bilirubin 0.5 0.0 - 1.2 mg/dL   GFR, Estimated 24 (L) >60 mL/min    Comment: (NOTE) Calculated using the CKD-EPI Creatinine Equation (2021)    Anion gap 15 5 - 15  Comment: Performed at [facility], [address], [place], [name] [address]  CBC with Differential/Platelet     Status: Abnormal   Collection  Time: 12/31/24  2:25 PM  Result Value Ref Range   WBC 7.4 4.0 - 10.5 K/uL   RBC 3.18 (L) 4.22 - 5.81 MIL/uL   Hemoglobin 9.2 (L) 13.0 - 17.0 g/dL   HCT 71.7 (L) 60.9 - 47.9 %   MCV 88.7 80.0 - 100.0 fL   MCH 28.9 26.0 - 34.0 pg   MCHC 32.6 30.0 - 36.0 g/dL   RDW 83.4 (H) 88.4 - 84.4 %   Platelets 187 150 - 400 K/uL   nRBC 2.8 (H) 0.0 - 0.2 %   Neutrophils Relative % 60 %   Neutro Abs 4.5 1.7 - 7.7 K/uL   Lymphocytes Relative 26 %   Lymphs Abs 1.9 0.7 - 4.0 K/uL   Monocytes Relative 10 %   Monocytes Absolute 0.8 0.1 - 1.0 K/uL   Eosinophils Relative 0 %   Eosinophils Absolute 0.0 0.0 - 0.5 K/uL   Basophils Relative 1 %   Basophils Absolute 0.0 0.0 - 0.1 K/uL   Immature Granulocytes 3 %   Abs Immature Granulocytes 0.22 (H) 0.00 - 0.07 K/uL    Comment: Performed at [facility], [address], [place], [name] [address]  Glucose, capillary     Status: Abnormal   Collection Time: 12/31/24  3:39 PM  Result Value Ref Range   Glucose-Capillary 120 (H) 70 - 99 mg/dL    Comment: Glucose reference range applies only to samples taken after fasting for at least 8 hours.    MICRO:  IMAGING: DG Chest Port 1 View Result Date: 12/31/2024 CLINICAL DATA:  Shortness of breath EXAM: PORTABLE CHEST 1 VIEW COMPARISON:  Chest radiograph dated 12/30/2024 FINDINGS: Low lung volumes with bronchovascular crowding. Diffuse bilateral interstitial and patchy opacities. Blunting of the right costophrenic angle. No pneumothorax. Similar enlarged cardiomediastinal silhouette. No acute osseous abnormality. IMPRESSION: 1. Diffuse bilateral interstitial and patchy opacities, which may represent pulmonary edema or pneumonia. 2. Blunting of the right costophrenic angle, which may represent a small pleural effusion. Electronically Signed   By: Limin  Xu M.D.   On: 12/31/2024 14:13   ECHOCARDIOGRAM LIMITED Result Date: 12/30/2024    ECHOCARDIOGRAM LIMITED REPORT   Patient Name:   Raymond Gordon Date of Exam:  12/30/2024 Medical Rec #:  [id number]     Height:       64.0 in Accession #:    [id number]    Weight:       232.8 lb Date of Birth:  07-21-1951      BSA:          2.087 m Patient Age:    73 years      BP:           93/47 mmHg Patient Gender: M             HR:           98 bpm. Exam Location:  Inpatient Procedure: Limited Echo, Color Doppler, Cardiac Doppler and Intracardiac            Opacification Agent (Both Spectral and Color Flow Doppler were            utilized during procedure). Indications:     CHF  History:         Patient has prior history of Echocardiogram examinations, most  recent 11/14/2024. CHF; Risk Factors:Former Smoker and                  Diabetes.  Sonographer:     [facility] Referring Phys:  [id number] RAMESH KC Diagnosing Phys: Vinie Maxcy MD  Sonographer Comments: Technically difficult study due to poor echo windows. Image acquisition challenging due to respiratory motion, Image acquisition challenging due to patient body habitus and patient positioning. IMPRESSIONS  1. Limited echo  2. Right ventricular systolic function is normal. The right ventricular size is normal. There is moderately elevated pulmonary artery systolic pressure. The estimated right ventricular systolic pressure is 47.3 mmHg.  3. Left ventricular ejection fraction, by estimation, is 65 to 70%. The left ventricle has normal function. The left ventricle has no regional wall motion abnormalities. There is mild left ventricular hypertrophy. The average left ventricular global longitudinal strain is -18.3 %. The global longitudinal strain is mildly abnormal along the septum, but no evidence for amyloid pattern.  4. Left atrial size was mildly dilated.  5. The aortic valve is tricuspid. Aortic valve regurgitation is not visualized. No aortic stenosis is present.  6. The inferior vena cava is dilated in size with <50% respiratory variability, suggesting right atrial pressure of 15 mmHg. Comparison(s): Changes from prior study  are noted. 11/14/2024: LVEF 55-60%. FINDINGS  Left Ventricle: Left ventricular ejection fraction, by estimation, is 65 to 70%. The left ventricle has normal function. The left ventricle has no regional wall motion abnormalities. Definity  contrast agent was given IV to delineate the left ventricular  endocardial borders. The average left ventricular global longitudinal strain is -18.3 %. Strain was performed and the global longitudinal strain is abnormal. There is mild left ventricular hypertrophy. Right Ventricle: The right ventricular size is normal. No increase in right ventricular wall thickness. Right ventricular systolic function is normal. There is moderately elevated pulmonary artery systolic pressure. The tricuspid regurgitant velocity is 2.84 m/s, and with an assumed right atrial pressure of 15 mmHg, the estimated right ventricular systolic pressure is 47.3 mmHg. Left Atrium: Left atrial size was mildly dilated. Right Atrium: Right atrial size was normal in size. Mitral Valve: MV peak gradient, 4.8 mmHg. The mean mitral valve gradient is 3.0 mmHg. Tricuspid Valve: The tricuspid valve is grossly normal. Tricuspid valve regurgitation is trivial. Aortic Valve: The aortic valve is tricuspid. Aortic valve regurgitation is not visualized. No aortic stenosis is present. Aortic valve mean gradient measures 3.0 mmHg. Aortic valve peak gradient measures 5.3 mmHg. Aortic valve area, by VTI measures 2.34 cm. Pulmonic Valve: The pulmonic valve was normal in structure. Pulmonic valve regurgitation is not visualized. Aorta: The aortic root and ascending aorta are structurally normal, with no evidence of dilitation. Venous: The inferior vena cava is dilated in size with less than 50% respiratory variability, suggesting right atrial pressure of 15 mmHg. IAS/Shunts: No atrial level shunt detected by color flow Doppler. Additional Comments: Spectral Doppler performed. Color Doppler performed.  LEFT VENTRICLE PLAX 2D LVIDd:          4.50 cm LVIDs:         3.10 cm     2D Longitudinal Strain LV PW:         1.10 cm     2D Strain GLS (A4C):   -17.1 % LV IVS:        1.30 cm     2D Strain GLS (A3C):   -20.1 % LVOT diam:     2.20 cm     2D Strain  GLS (A2C):   -17.7 % LV SV:         48          2D Strain GLS Avg:     -18.3 % LV SV Index:   23 LVOT Area:     3.80 cm LV IVRT:       50 msec  LV Volumes (MOD) LV vol d, MOD A4C: 66.6 ml LV vol s, MOD A4C: 21.3 ml LV SV MOD A4C:     66.6 ml IVC IVC diam: 2.50 cm LEFT ATRIUM             Index        RIGHT ATRIUM           Index LA diam:        4.00 cm 1.92 cm/m   RA Area:     19.80 cm LA Vol (A2C):   70.2 ml 33.64 ml/m  RA Volume:   62.50 ml  29.95 ml/m LA Vol (A4C):   70.6 ml 33.83 ml/m LA Biplane Vol: 76.0 ml 36.42 ml/m  AORTIC VALVE AV Area (Vmax):    2.61 cm AV Area (Vmean):   2.55 cm AV Area (VTI):     2.34 cm AV Vmax:           115.00 cm/s AV Vmean:          84.800 cm/s AV VTI:            0.206 m AV Peak Grad:      5.3 mmHg AV Mean Grad:      3.0 mmHg LVOT Vmax:         79.00 cm/s LVOT Vmean:        56.800 cm/s LVOT VTI:          0.127 m LVOT/AV VTI ratio: 0.62  AORTA Ao Root diam: 3.00 cm Ao Asc diam:  2.90 cm MITRAL VALVE               TRICUSPID VALVE MV Area (PHT): 3.00 cm    TR Peak grad:   32.3 mmHg MV Area VTI:   4.27 cm    TR Vmax:        284.00 cm/s MV Peak grad:  4.8 mmHg MV Mean grad:  3.0 mmHg    SHUNTS MV Vmax:       1.09 m/s    Systemic VTI:  0.13 m MV Vmean:      76.5 cm/s   Systemic Diam: 2.20 cm MV Decel Time: 253 msec MV E velocity: 70.20 cm/s Vinie Maxcy MD Electronically signed by Vinie Maxcy MD Signature Date/Time: 12/30/2024/4:21:54 PM    Final    DG Chest Port 1 View Result Date: 12/30/2024 CLINICAL DATA:  Shortness of breath. EXAM: PORTABLE CHEST 1 VIEW COMPARISON:  December 28, 2024 FINDINGS: The cardiac silhouette is mildly enlarged and unchanged in size. Low lung volumes are noted. There is prominence of the pulmonary vasculature with mild diffusely  increased interstitial lung markings. No focal consolidation, pleural effusion or pneumothorax is identified. The visualized skeletal structures are unremarkable. IMPRESSION: Cardiomegaly and low lung volumes with pulmonary vascular congestion and mild interstitial edema. Electronically Signed   By: Suzen Dials M.D.   On: 12/30/2024 12:59   US  ABDOMEN LIMITED RUQ (LIVER/GB) Result Date: 12/30/2024 EXAM: Right Upper Quadrant Abdominal Ultrasound TECHNIQUE: Real-time ultrasonography of the right upper quadrant of the abdomen was performed. COMPARISON: CT chest, abdomen, and pelvis 12/26/2024. CLINICAL HISTORY: 74 year old male with  elevated liver enzymes. FINDINGS: LIVER: The liver demonstrates normal echogenicity. No intrahepatic biliary ductal dilatation. No mass. BILIARY SYSTEM: Gallbladder appears mildly distended and also thick walled (6 mm in thickness image 5). No sludge or stones identified within the lumen. No pericholecystic fluid. No sonographic Beverley sign is elicited. Common bile duct is within normal limits measuring 3 mm. OTHER: Right kidney is partially visible, negative. No right upper quadrant ascites. IMPRESSION: 1. Nonspecific gallbladder wall thickening (6 mm). Given absence of sonographic Beverley sign favor this is reactive / secondary rather than related to acalculus cholecystitis. 2. Negative liver ultrasound appearance of the liver. Electronically signed by: Helayne Hurst MD 12/30/2024 11:34 AM EST RP Workstation: [id number]    HISTORICAL MICRO/IMAGING  Assessment/Plan:  24bn M chronic diastolic heart failure, OSA, obesity, admitted initially for hypoxia, encephalopathy,now having fevers on hospital day #6, not improving. Transaminitis, maybe explained with some element of congestive hepatopathy. Viral studies pending. Recent cbc with diff showing more granulocytes. Currently on only on ceftriaxone . Will add MRSA coverage with linezolid .   Await results from repeat blood cx to see  if any new pathogen is found. He is clinically stable, but not improving on prior empiric regimen. If not improving, recommend to get LP for CSF analysis  Will check sed rate, crp,  Repeat cbc with diff tomorrow; follow up on outstanding labs  Transaminitis = slighlty improved this afternoon, continue to trend out. Ammonia levels WNL  Acute on chronic ckd = will avoid nephrotoxic drugs, such as vancomycin    evaluation of this patient requires complex antimicrobial therapy evaluation and counseling and isolation needs for disease transmission risk assessment and mitigation.   I personally spent a total of 81 minutes in the care of the patient today including preparing to see the patient, performing a medically appropriate exam/evaluation, placing orders, referring and communicating with other health care professionals, documenting clinical information in the EHR, independently interpreting results, communicating results, and coordinating care.      [1] No Known Allergies [2]  Social History Tobacco Use   Smoking status: Former    Current packs/day: 0.00    Types: Cigarettes    Quit date: 11/25/1997    Years since quitting: 27.1   Smokeless tobacco: Former    Quit date: 06/14/2015  Vaping Use   Vaping status: Never Used  Substance Use Topics   Alcohol  use: No    Alcohol /week: 0.0 standard drinks of alcohol    Drug use: No   "

## 2024-12-31 NOTE — Progress Notes (Signed)
 " Daily Progress Note   Date: 12/31/2024   Patient Name: Raymond Gordon  DOB: [birth year]  MRN: [id number]  Age / Sex: 74 y.o., male  Attending Physician: Christobal Guadalajara, MD Primary Care Physician: [name], MD Admit Date: 12/26/2024 Length of Stay: 4 days  Reason for Follow-up: Establishing goals of care  Past Medical History:  Diagnosis Date   Atrial fibrillation with RVR (HCC) 07/08/2015   Bilateral lower extremity edema    Chronic diastolic CHF (congestive heart failure) (HCC) 08/15/2015   a. 06/2015: echo showing EF of 65-70% with Grade 2 DD   Diabetes (HCC)    INSULIN  DEPENDENT   Dysrhythmia    Hyperglycemia due to type 2 diabetes mellitus (HCC) 04/11/2021   Hyperlipidemia    Hypertension    Hypoglycemia 05/20/2014   Obesity    Obesity    Paroxysmal atrial fibrillation (HCC)    Sleep apnea     Subjective:   Subjective: Chart Reviewed. Updates received. Patient Assessed. Created space and opportunity for patient  and family to explore thoughts and feelings regarding current medical situation.  Today's Discussion: Today before meeting with the patient/family, I reviewed the chart notes including cardiology NP note from yesterday SLP note from yesterday; hospitalist note, nephrology note, GI note, chaplain note, TOC note, cardiology note, neurology note, radiology/IR note all from today. I also reviewed vital signs, nursing flowsheets, medication administrations record, labs, and imaging.  Today I initially saw the patient in the morning, no family is present.  The patient is essentially obtunded, with significant cueing will open his eyes, not making meaningful communication, not following commands.  I returned later in the day see the patient again with all 3 sisters present.  They shared confusion about possible plans for lumbar puncture.  I requested neurology NP to come back to bedside who cleared the confusion.  Not planning LP today, possibly reevaluate tomorrow.   After talking about what an LP involves family is agreeable that this could be acceptable within goals of care.  We also talked about conversations with nephrology about possibility of hemodialysis.  They are clear that he would not want hemodialysis long-term, are not sure how to respond to possibility of short-term trial.  We spent a significant amount of time talking about what dialysis entails, how it was unlikely to fix the overall picture of apparent multisystem organ dysfunction.  At the end of our detailed conversation they do not seem keen on hemodialysis even as a temporary trial, but would appreciate speaking with nephrology tomorrow to make a final decision.  Towards end of her conversation we spent a good amount of time exploring overall hopes of the patient's recovery, fears about possibility of continued decline.  I allowed time for the express her feelings and spent time answering questions as well.  I shared that I return tomorrow to check on the patient and update patient's family.  They are hoping to be here around 11:00 AM. I provided emotional and general support through therapeutic listening, empathy, sharing of stories, and other techniques. I answered all questions and addressed all concerns to the best of my ability.  Review of Systems  Unable to perform ROS   Objective:   Primary Diagnoses: Present on Admission:  Essential hypertension  Severe OSA (obstructive sleep apnea)  Type 2 diabetes mellitus with hyperlipidemia (HCC)  Obesity, class 3 (HCC)  PAF (paroxysmal atrial fibrillation) (HCC)  Chronic diastolic heart failure (HCC)  Hypernatremia  Hypothermia   Vital Signs:  BP 132/66 (BP Location: Left Arm)   Pulse 96   Temp (!) 100.7 F (38.2 C) (Oral)   Resp 20   Ht 5' 4 (1.626 m)   Wt 105.6 kg   SpO2 92%   BMI 39.96 kg/m   Physical Exam Vitals and nursing note reviewed.  Constitutional:      General: He is sleeping. He is not in acute distress.     Appearance: He is ill-appearing.  HENT:     Head: Normocephalic and atraumatic.  Cardiovascular:     Rate and Rhythm: Tachycardia present.  Pulmonary:     Effort: Pulmonary effort is normal. No respiratory distress.  Abdominal:     General: Abdomen is flat. There is no distension.  Skin:    General: Skin is warm and dry.  Neurological:     Comments: Very lethargic, neuro tended.  Not engaging in meaningful communication or following commands     Palliative Assessment/Data: 10%   Existing Vynca/ACP Documentation: MOST form signed 12/30/2024  Assessment & Plan:   HPI/Patient Profile:  74 y.o. male  with past medical history of A fib on Eliquis , diabetes mellitus, congestive heart failure, HTN, HLD, obesity and OSA, with left sided chronic weakness for several months, dementia with sundowning who presented to [facility] ED on 12/26/24 for evaluation of shortness of breath, hypoxia and altered mental status.  After ED evaluation he was admitted on 12/26/2024 with acute metabolic encephalopathy likely multifactorial dementia with sundowning, right arm weakness status postcode stroke ruled out, acute hypoxic respiratory failure with mucous plugging, hypernatremia, hypothermia with soft blood pressures and possible sepsis, BPH with marked prostatomegaly with bladder outlet obstruction, transaminitis, and others.    Palliative medicine was consulted for GOC conversations.  12/31/2024: Today the patient remains essentially unchanged, near obtunded and not following commands.  Spiking a fever.  Ongoing workup continues, possibility of LP possibly reassess tomorrow.  Kidney function continues to climb, LFTs continue to climb and approaching a picture of multisystem organ dysfunction.  Family remains engaged and setting limits.  SUMMARY OF RECOMMENDATIONS   DNR-Limited No feeding tube, transfer to ICU, cardiac cath No long-term hemodialysis, leaning towards note dialysis trial pending further communication with  nephrology Open to other offered less invasive workup and treatments Family amendable to considering LP if offered Time for outcomes Continue supportive patient's family Ongoing GOC conversations pending evolution of his clinical picture Palliative medicine will follow-up tomorrow  Symptom Management:  Per primary team Palliative medicine is available to assist as needed  Code Status: DNR - Limited (DNR/DNI)  Prognosis: Unable to determine  Discharge Planning: To Be Determined  Discussed with: Patient's sisters Joyce, Geraldine, April; hospitalist Dr. CHRISTOBAL, neurology NP Karna Geralds, nephrologist Dr. Macel  Thank you for allowing us  to participate in the care of CHINEDUM VANHOUTEN PMT will continue to support holistically.  I personally spent a total of 75 minutes in the care of the patient today including preparing to see the patient, getting/reviewing separately obtained history, performing a medically appropriate exam/evaluation, counseling and educating, referring and communicating with other health care professionals, documenting clinical information in the EHR, and communicating results.  Camellia Kays, NP Palliative Medicine Team  Team Phone # [phone] (Nights/Weekends)  07/24/2021, 8:17 AM  "

## 2024-12-31 NOTE — Progress Notes (Signed)
 Nasotracheal suction done. Got a moderate amount of thick tan secretions. Patient tolerated fairly well.

## 2024-12-31 NOTE — Progress Notes (Signed)
 SLP Cancellation Note  Patient Details Name: Raymond Gordon MRN: [id number] DOB: [birth year]   Cancelled treatment:       Reason Eval/Treat Not Completed: Fatigue/lethargy limiting ability to participate (exhibits no response to oral care, repositioning, or ice chips touched to his lips). SLP will continue following.    Damien Blumenthal, M.A., CCC-SLP Speech Language Pathology, Acute Rehabilitation Services  Secure Chat preferred [phone]  12/31/2024, 11:16 AM

## 2024-12-31 NOTE — Consult Note (Signed)
 "                                               Consultation Note   Referring Provider:   [facility] PCP: [name], Richard W, MD Primary Gastroenterologist:   Sampson      Reason for Consultation: Elevated LFTs DOA: 12/26/2024         Hospital Day: 6   Assessment:: # 74 yo male with multiple co-morbidities and elevated liver chemistries (mixed pattern)   Liver chemistries were normal the end of December, elevated on admission 2/1 and have gotten progressively this admission.  Moderately distended gallbladder but no other hepatobiliary findings on imaging. CT scan was with IV so doubt thrombotic event involving hepatic vasculature. Acute viral hep panel negative but could still be viral . Could this be DILI ? Looked for any new meds during hospitalization in January. Took Trazadone but not usually associated with hepatotoxicity. Patient unable to provide history so unsure of what if any new meds / OTC meds or supplements he took since last hospitalization.   # Acute metabolic encephalopathy # Dementia ? Etiology. Neurology is following.   # Acute hypoxic respiratory failure # Mucous plugging # ? Aspiration   # AKI on CKD  # Chronic anemia Hemoglobin stable at 9.9  # Severe OSA  # Hypernatremia  # BPH / prostamegaly / chronic foley  # Paroxysmal atrial fibrillation  # HFpEF  # CKD  # See PMH for any additional medical history  / medical problems   Plan: --Added INR, CK and labs for EBV, HSV, CMV --am Liver chemistries --Avoid hepatotoxic medications  Principal Problem:   Hypothermia Active Problems:   PAF (paroxysmal atrial fibrillation) (HCC)   Essential hypertension   Type 2 diabetes mellitus with hyperlipidemia (HCC)   Severe OSA (obstructive sleep apnea)   Chronic diastolic heart failure (HCC)   Obesity, class 3 (HCC)   Hypernatremia   Altered mental status   Acute on chronic HFrEF (heart failure with reduced ejection fraction) (HCC)   HPI:   74 year old male with history of gout, atrial fibrillation, HFpEF, anemia of chronic disease , DM2  and dementia, hypertension, CKD 3, BPH with chronic Foley   **Patient unable to provide history due to metabolic encephalopathy  Patient was hospitalized early January 2026 with a TIA and acute kidney injury.  He was readmitted to 12/27/24 after being brought over from jail for shortness of breath and acute metabolic encephalopathy.  Ruled out for stroke. He was hypothermic, hypoxic.  He was placed on a Lawyer.  LFTs were noted to be elevated.  Neurology has been following for encephalopathy. Cause hasn't been clear, ? Delirium related to underlying medical issues .  Patient  / family met with Palliative Medicine to establish goals of care. Per that note:  After extensive conversation family states that he would want an attempt at resuscitation, but would not want to be on life support for prolonged period of time.  He would also never want to be trached.  I explored the idea that if, for some reason, he would require resuscitation and intubation and was unlikely to come off the vent that would require a conversation about when to discontinue life support knowing that he would not want to be on life support prolonged period of time. They understand.     Workup  notable for:  Acute hep panel negative.   Normal ammonia CT AP with contrast Hepatobiliary: Moderate gallbladder distention without cholelithiasis or cholecystitis. The liver is unremarkable without biliary duct dilation.   RUQ US  1. Nonspecific gallbladder wall thickening (6 mm). Given absence of sonographicMurphy sign favor this is reactive / secondary rather than related to acalculus cholecystitis. 2. Negative liver ultrasound appearance of the liver.  MRI brain No acute infarct   Recent Labs    12/29/24 0256 12/30/24 0218 12/31/24 0322  PROT 7.3 7.0 7.1  ALBUMIN 3.1* 3.0* 2.8*  AST 137* 663* 820*  ALT 196* 465* 515*   ALKPHOS 187* 283* 404*  BILITOT 0.3 0.3 0.6   Recent Labs    12/29/24 0256 12/30/24 0218 12/31/24 0322  WBC 6.4 8.5 8.0  HGB 10.0* 10.0* 9.9*  HCT 32.2* 31.9* 30.8*  MCV 91.7 90.1 88.5  PLT 165 178 183   Recent Labs    12/30/24 0218 12/31/24 0322 12/31/24 0849  NA 151* 148* 141  K 3.5 3.5 3.0*  CL 117* 115* 110  CO2 23 19* 17*  GLUCOSE 130* 141* 283*  BUN 32* 46* 45*  CREATININE 1.51* 2.45* 2.57*  CALCIUM  9.8 11.9* 8.8*     Most Recent Endoscopic Procedures:   No procedures found in epic   Review of Systems: Unable to obtain-patient with altered mental status  Physical Exam: Vital signs in last 24 hours: Temp:  [99.1 F (37.3 C)-101.1 F (38.4 C)] 101.1 F (38.4 C) (02/06 0749) Pulse Rate:  [81-119] 115 (02/06 0749) Resp:  [22-36] 30 (02/06 0749) BP: (93-143)/(46-94) 121/46 (02/06 0749) SpO2:  [97 %-100 %] 100 % (02/06 0826) FiO2 (%):  [32 %] 32 % (02/05 2018)   General: Well-developed male lying in bed in no acute distress .  Obtunded  Eyes: Pupils equal Nose: No deformity, discharge or lesions Neck:  Supple Lungs: Congested breath sounds and chest .  Heart:  Regular rate, regular rhythm.  Abdomen:  Soft, nondistended, does not appear to be tender, active bowel sounds, Rectal :  Deferred Msk: Symmetrical without gross deformities.  Neurologic: Obtunded.  Does not follow commands  OUTPATIENT MEDICATIONS Prior to Admission medications  Medication Sig Start Date End Date Taking? Authorizing Provider  acetaminophen  (TYLENOL ) 325 MG tablet Take 2 tablets (650 mg total) by mouth every 6 (six) hours as needed for mild pain (pain score 1-3) or fever (or Fever >/= 101). 06/07/24  Yes Elgergawy, Brayton RAMAN, MD  allopurinol  (ZYLOPRIM ) 300 MG tablet Take 300 mg by mouth in the morning. 05/02/15  Yes [provider]  amLODipine  (NORVASC ) 10 MG tablet Take 1 tablet (10 mg total) by mouth daily. 12/01/24  Yes Drusilla Sabas RAMAN, MD  apixaban  (ELIQUIS ) 5 MG TABS  tablet Take 1 tablet by mouth twice daily 09/19/23  Yes Court Dorn PARAS, MD  atorvastatin  (LIPITOR ) 80 MG tablet Take 1 tablet by mouth once daily Patient taking differently: Take 80 mg by mouth at bedtime. 06/18/23  Yes Court Dorn PARAS, MD  finasteride  (PROSCAR ) 5 MG tablet Take 5 mg by mouth every evening. 04/11/22  Yes [provider]  hydrALAZINE  (APRESOLINE ) 25 MG tablet Take 25 mg by mouth 3 (three) times daily.   Yes [provider]  insulin  aspart (NOVOLOG ) 100 UNIT/ML injection Inject 0-9 Units into the skin 3 (three) times daily with meals. Sliding scale insulin  Less than 70 initiate hypoglycemia protocol 70-120  0 units 120-150 1 unit 151-200 2 units 201-250 3 units 251-300  5 units 301-350 7 units 351-400 9 units Greater than 400 call MD 11/30/24  Yes Drusilla Sabas RAMAN, MD  pantoprazole  (PROTONIX ) 40 MG tablet Take 1 tablet (40 mg total) by mouth daily. 06/10/24  Yes Ghimire, Donalda HERO, MD  thiamine  (VITAMIN B-1) 100 MG tablet Take 1 tablet (100 mg total) by mouth daily. 12/01/24  Yes Drusilla Sabas RAMAN, MD  traZODone  (DESYREL ) 50 MG tablet Take 0.5 tablets (25 mg total) by mouth at bedtime. 11/30/24  Yes Drusilla, Sabas RAMAN, MD  Lancets [facility] DELICA PLUS [place]) MISC Apply topically as directed. 06/29/24   [provider]  LANTUS  SOLOSTAR 100 UNIT/ML Solostar Pen Inject 18 Units into the skin at bedtime. Patient not taking: Reported on 12/27/2024 11/30/24   Drusilla Sabas RAMAN, MD  losartan  (COZAAR ) 100 MG tablet Take 100 mg by mouth daily. Patient not taking: Reported on 12/27/2024 09/13/24   [provider]  [facility] VERIO test strip 1 each by Other route as needed for other.    [provider]  PRESCRIPTION MEDICATION Inhale into the lungs at bedtime. Pt is on C Pap machine Patient not taking: Reported on 12/27/2024    [provider]    Allergies as of 12/26/2024   (No Known Allergies)    INPATIENT MEDICATIONS Current Facility-Administered Medications   Medication Dose Route Frequency Provider Last Rate Last Admin   arformoterol  (BROVANA ) nebulizer solution 15 mcg  15 mcg Nebulization BID Kc, Ramesh, MD   15 mcg at 12/31/24 9286   budesonide  (PULMICORT ) nebulizer solution 0.25 mg  0.25 mg Nebulization BID Kc, Mennie, MD   0.25 mg at 12/31/24 9286   cefTRIAXone  (ROCEPHIN ) 2 g in sodium chloride  0.9 % 100 mL IVPB  2 g Intravenous Q24H Christobal Mennie, MD   Stopped at 12/30/24 1236   Chlorhexidine  Gluconate Cloth 2 % PADS 6 each  6 each Topical Daily Christobal Mennie, MD   6 each at 12/30/24 9085   dextrose  5 % solution   Intravenous Continuous Kc, Ramesh, MD 50 mL/hr at 12/31/24 0400 Infusion Verify at 12/31/24 0400   [START ON 01/01/2025] enoxaparin  (LOVENOX ) injection 100 mg  100 mg Subcutaneous Q24H Hurth, Kimberly P, RPH       insulin  aspart (novoLOG ) injection 0-6 Units  0-6 Units Subcutaneous TID WC Sim, Mohammad L, MD       pantoprazole  (PROTONIX ) injection 40 mg  40 mg Intravenous Q24H Kc, Ramesh, MD   40 mg at 12/31/24 0808   potassium chloride  10 mEq in 100 mL IVPB  10 mEq Intravenous Q1 Hr x 4 Kc, Ramesh, MD       revefenacin  (YUPELRI ) nebulizer solution 175 mcg  175 mcg Nebulization Daily Kc, Ramesh, MD   175 mcg at 12/31/24 9286     Past Medical History:  Diagnosis Date   Atrial fibrillation with RVR (HCC) 07/08/2015   Bilateral lower extremity edema    Chronic diastolic CHF (congestive heart failure) (HCC) 08/15/2015   a. 06/2015: echo showing EF of 65-70% with Grade 2 DD   Diabetes (HCC)    INSULIN  DEPENDENT   Dysrhythmia    Hyperglycemia due to type 2 diabetes mellitus (HCC) 04/11/2021   Hyperlipidemia    Hypertension    Hypoglycemia 05/20/2014   Obesity    Obesity    Paroxysmal atrial fibrillation (HCC)    Sleep apnea     Past Surgical History:  Procedure Laterality Date   CARDIAC CATHETERIZATION     CARDIAC CATHETERIZATION N/A 2012  no large vessel occlusions   IR ANGIOGRAM SELECTIVE EACH ADDITIONAL VESSEL  08/05/2024    IR ANGIOGRAM SELECTIVE EACH ADDITIONAL VESSEL  08/05/2024   IR ANGIOGRAM VISCERAL SELECTIVE  08/05/2024   IR ANGIOGRAM VISCERAL SELECTIVE  08/05/2024   IR EMBO ARTERIAL NOT HEMORR HEMANG INC GUIDE ROADMAPPING  08/05/2024   IR RADIOLOGIST EVAL & MGMT  05/03/2024   IR RADIOLOGIST EVAL & MGMT  09/06/2024   IR US  GUIDE VASC ACCESS LEFT  08/05/2024    Family History  Problem Relation Age of Onset   Heart failure Brother    Hyperlipidemia Brother    Hypertension Brother    Stroke Brother    Hypertension Mother    Hyperlipidemia Mother    Diabetes Mother    Cancer Father        prostate   GER disease Father    Stroke Father    Hypertension Sister    Hyperlipidemia Sister    Hypertension Maternal Grandmother    Stroke Maternal Grandmother    Diabetes Maternal Grandfather    Hypertension Maternal Grandfather     Social History   Socioeconomic History   Marital status: Single    Spouse name: Not on file   Number of children: Not on file   Years of education: Not on file   Highest education level: Not on file  Occupational History   Not on file  Tobacco Use   Smoking status: Former    Current packs/day: 0.00    Types: Cigarettes    Quit date: 11/25/1997    Years since quitting: 27.1   Smokeless tobacco: Former    Quit date: 06/14/2015  Vaping Use   Vaping status: Never Used  Substance and Sexual Activity   Alcohol  use: No    Alcohol /week: 0.0 standard drinks of alcohol    Drug use: No   Sexual activity: Not on file  Other Topics Concern   Not on file  Social History Narrative   Not on file   Social Drivers of Health   Tobacco Use: Medium Risk (12/26/2024)   Patient History    Smoking Tobacco Use: Former    Smokeless Tobacco Use: Former    Passive Exposure: Not on Stage Manager: Not on file  Food Insecurity: No Food Insecurity (12/28/2024)   Epic    Worried About Programme Researcher, Broadcasting/film/video in the Last Year: Never true    [employer] of Food in the Last Year: Never  true  Transportation Needs: Unmet Transportation Needs (12/28/2024)   Epic    Lack of Transportation (Medical): Yes    Lack of Transportation (Non-Medical): Yes  Physical Activity: Not on file  Stress: Not on file  Social Connections: Moderately Isolated (12/28/2024)   Social Connection and Isolation Panel    Frequency of Communication with Friends and Family: Never    Frequency of Social Gatherings with Friends and Family: Twice a week    Attends Religious Services: More than 4 times per year    Active Member of [employer] or Organizations: Yes    Attends Banker Meetings: More than 4 times per year    Marital Status: Divorced  Intimate Partner Violence: Not At Risk (12/28/2024)   Epic    Fear of Current or Ex-Partner: No    Emotionally Abused: No    Physically Abused: No    Sexually Abused: No  Depression (PHQ2-9): Not on file  Alcohol  Screen: Not on file  Housing: High Risk (12/28/2024)  Epic    Unable to Pay for Housing in the Last Year: No    Number of Times Moved in the Last Year: 0    Homeless in the Last Year: Yes  Utilities: Not At Risk (12/28/2024)   Epic    Threatened with loss of utilities: No  Health Literacy: Not on file    Code Status   Code Status: Limited: Do not attempt resuscitation (DNR) -DNR-LIMITED -Do Not Intubate/DNI    Vina Dasen, NP-C   12/31/2024, 9:49 AM     "

## 2024-12-31 NOTE — Progress Notes (Addendum)
 " PROGRESS NOTE Raymond Gordon  FMW:[id number] DOB: [birth year] DOA: 12/26/2024 PCP: [name], Richard W, MD  Brief Narrative/Hospital Course: Raymond Gordon is a 74 y.o. male with PMH of f A fib on Eliquis , diabetes mellitus, congestive heart failure, HTN, HLD, obesity and OSA, with left sided chronic weakness for several months, dementia with sundowning who presented to [facility] ED on 12/26/24 for evaluation of shortness of breath, hypoxia and altered mental status. In the ED was noted to be hypoxic, hypothermic and with soft BP's.  Patient unable to provide any history.  Reportedly at baseline able to converse, has chronic left-sided weakness undergoing rehab Last admission 12/19> 11/30/24-strokelike symptom, altered mental status and treated for dementia with sundowning EEG MRI negative for acute stroke but with chronic ischemia and seen and cleared by neurology In the ED: Vitals fairly stable although hypoxic needing 4 L nasal cannula at 1 point needed nonrebreather, Labs showed hyponatremia, mild metabolic acidosis stable renal function, mild transaminitis normal lactic acid Pro-Cal 1.6 CBC with mild chronic anemia UA with W 0-5 moderate LE ketone negative nitrate negative. Underwent extensive workup with imagings: CXR:no active disease. CT Head/CT head code stroke and CT angio head and neck> atrophy small vessel changes and old infarcts mucosal thickening in sinuses, chronic occlusion lt Vertebral Artery, similar 80% stenosis in the left carotid bulb/proximal left cervical ICA CT chest abdomen pelvis with contrast>> bilateral lower lobe consolidation favored atelectasis over pneumonia, cardiomegaly, marked prostatomegaly with chronic BOO, aortic atherosclerosis. EEG 2/2>mild generalized cerebral dysfunction ( encephalopathy). No seizures or epileptiform discharges  MRI brain: No acute infarct, old lacunar infarct  Subjective: Seen and examined  Remains minimally responsive able to open eyes to voice Having  low-grade temp overnight borderline heart rate, tachypneic, still needing oxygen but at 2 L, BP still stable Labs this morning> bump in creatinine further, sodium down to 148, uptrending transaminitis with normal T. Bili, calcium  went up 11.9 from 9.8> repeating BMP  UOP minimal 330 cc  Assessment and plan:  Acute metabolic encephalopathy Dementia with sundowning Right arm weakness per sist- CODE stroke and ruled out: Patient's encephalopathy likely multifactorial in the setting of dementia with sundowning, urine retention, MRI has lacunar infarcts, question polypharmacy trazodone  stopped.Recurrent admission for AMS- s/p extensive workup-with multiple imaging, EEG and neurology eval TSH-9.1,normal FT4, ammonia and B12 stable, thiamine  normal(11/16/24) His mentation more alert awake on 2/3 but subsequently waxing and waning. Remains minimally responsive able to wake up briefly to voice. Overall very guarded prognosis, continue to treat underlying correctable issues Meds changed to IV subcu, unable to take p.o. safely. Cont to keep on delirium precaution fall precaution. Avoid benzos sedatives Discussed with neurology again who will eval him  Acute hypoxic respiratory failure Mucous plugging: Patient hypoxic for EMS and has been needing initially and NRB-  now on [place]-patient having secretions and mucous plugging likely contributing to his hypoxia in the setting of altered mental status,? aspiration pneumonia. repeat CXR 2/3 low volume,vascular congestion and interstitial edema noted- CT on admission favor atelectasis Admitted with empiric vancomycin  and cefepime > changed to Unasyn  2/3-> given hypernatremia changed to ceftriaxone  2g on 2/5 after discusseing with pharmacy.S/p nasotracheal suctioning this morning with moderate amount of thick tan secretions and tolerated well.  Continue pulmonary toileting.   Cont Brovana  yupleri,Pulmicort  nebs. F/u repeat cxr   Hypothermia Soft blood  pressure Possible sepsis Suspected Pneumonia vs atelectasis: Patient with low temperature soft BP on presentation.blood culture  2/1 NGTD. UA unremarkable. COVID flu RSV negative  in ED.  CXR-cardiomegaly, pulmonary  congestion interstitial edema.CT chest abdomen pelvis on admit>bilateral lower lobe consolidation favored atelectasis over pneumonia, cardiomegaly, marked prostatomegaly with chronic BOO, aortic atherosclerosis. Temp low, but today having low-grade temp, requested ID eval 2/5- add doxycycline ,He remains on Rocephin  2 g Ordered RVP swab, repeat blood culture. Recent Labs  Lab 12/26/24 2236 12/26/24 2356 12/27/24 0319 12/27/24 1753 12/28/24 0246 12/29/24 0256 12/30/24 0218 12/30/24 1338 12/30/24 1601 12/31/24 0322  WBC  --   --  7.3  --  5.4 6.4 8.5  --   --  8.0  LATICACIDVEN 0.4* 0.4*  --   --   --   --   --  1.2 1.1  --   PROCALCITON  --   --   --  1.66  --   --   --   --  1.13  --     Hypernatremia: Sodium has improved with hypotonic fluid corrected sodium 144, stopping fluid this afternoon per nephrology  Recent Labs  Lab 12/28/24 0246 12/29/24 0256 12/30/24 0218 12/31/24 0322 12/31/24 0849  NA 152* 150* 151* 148* 141   BPH w/ marked prostatomegaly with chronic BOO on CT Abd: In September  had prostate art embolization by Dr Jennefer.Previously had indwelling catheter- until early November 2025. He sees Dr Terence discussed with the urology team-insert Foley-due to retention.  Will need long-term.  AKI on CKD 3a Metabolic acidosis Hypokatremia-replacing iv Hypercalcemia-Repeating labs ca ok: Renal function continues to worsen, Foley in place-multiple possibilities- Cardiorenal syndrome w. CHF, ATN ,with poor p.o. intake, possible CIN as he had CTA for code stroke eval. after discussing with cardiology placed on gentle D5W overnight 2/5, caution with fluid overload.  Nephrology consulted> May need RHC-both sisters against invasive workup at this time.  Discussed  with nephrology accordingly replacing potassium. Cont to monitor intake output renal function closely,avoid nephrotoxic medication/renally dose meds .   Recent Labs    11/26/24 1003 11/29/24 0137 11/30/24 0152 12/26/24 2104 12/26/24 2113 12/27/24 0319 12/27/24 0919 12/28/24 0246 12/29/24 0256 12/30/24 0218 12/31/24 0322 12/31/24 0849  BUN 38* 45* 39* 23  --  24*  --  26* 27* 32* 46* 45*  CREATININE 1.14 1.14 0.99 1.04  --  1.11  --  1.14 1.06 1.51* 2.45* 2.57*  CO2 21* 18* 23 19*  --  21*  --  24 22 23  19* 17*  K 4.6 5.4* 4.8 4.8   < > 4.6   < > 3.9 3.5 3.5 3.5 3.0*   < > = values in this interval not displayed.   Acute on chronic CHFpEF with volume overload Essential hypertension: Lasix  was discontinued on last admission.  PTA amlodipine  10, losartan  100- remains on hold. proBNP in 200 but has bumped up to 1200 , repeat CXR 2/5> showing cardiomegaly with pulmonary vascular congestion eat echo ordered and cardiology was consulted.  On Gentle IVF in the setting of AKI and hypernatremia after discussion w/ cardio 2/5 Repeat chest x-ray pending. In the setting of worsening AKI-nephro recommends RHC 2/5- based on palliative care discussion patient's sister would not want invasive workup like heart cath. Per cardiology holding off on diuretics at this time.  PAF: Rate uncontrolled, on Eliquis  PTA changed to Lovenox ,-dose renally   Transaminitis/liver dysfunctions: AST ALT further trending up- CT Abd -moderate GB distension w/o cholelithiasis or cholecystitis,liver unremarkable without biliary dilatation.Statin remains on hold. RUQ US  2/5> non specific GB thickening-given absence of sonographic Murphy sign favor this is reactive /  secondary rather than related to acalculus cholecystitis.Acute viral hepatitis panel negative. Will consult GI team. Checking inr, ebv, hsv cmp Wondering if it is due to congestive hepatopathy from fluid overload,?DILI, avoid hepatotoxic meds Recent Labs   Lab 12/27/24 0319 12/27/24 2303 12/28/24 0246 12/29/24 0256 12/30/24 0218 12/30/24 1601 12/31/24 0322  AST 80*  --  69* 137* 663*  --  820*  ALT 212*  --  175* 196* 465*  --  515*  ALKPHOS 187*  --  168* 187* 283*  --  404*  BILITOT 0.3  --  0.3 0.3 0.3  --  0.6  PROT 7.2  --  7.1 7.3 7.0  --  7.1  ALBUMIN 3.2*  --  3.1* 3.1* 3.0*  --  2.8*  AMMONIA  --  26  --   --   --  24  --   PLT 149*  --  158 165 178  --  183   Severe OSA: ABG/VBG no signs of CO2 retention,has not been complaint w/ CPAP PTA as per sisters.  Chronic left lower extremity weakness Degenerative disc disease mild to moderate diffuse spinal stenosis C2-3 through C6-7 disc bulges at multiple level and facet arthropathy: Patient has had extensive workup recently as well with negative MRI as well as CT T and L-spine plan was for EMG NCS as outpatient.  No acute stroke on admission.Continue PT OT as able.  Type 2 diabetes mellitus with hyperlipidemia: well-controlled, continue SSI 0-6 u.PTA on 18 units Lantus  at bedtime and now currently on hold. Lipitor  80 on hold due to transaminitis  Gout: Hold po allopurinol .  Class II Obesity w/ Body mass index is 39.96 kg/m.: Will benefit with PCP follow-up, weight loss,healthy lifestyle and outpatient sleep eval if not done.  Goals of care Deconditioning/debility Mobility: Continue PT OT recommending SNF.  Palliative care has been consulted overall guarded prognosis as patient remains encephalopathy with multiorgan dysfunction.  He has recurrent admissions for similar altered mental status Appreciate palliative input. I had multiple discussion with sisters. After palliative meeting patient changed to DNR/DNI 12/30/24  Prognosis remains  guarded to poor at this time. Sisters/family wants to continue current management- and cont ongoing palliative discussion as he seems to be overall doing poorly  PT Orders: Active PT Follow up Rec: Skilled Nursing-Short Term Rehab (<3  Hours/Day)12/30/2024 1625   DVT prophylaxis: Lovenox  Code Status:   Code Status: Limited: Do not attempt resuscitation (DNR) -DNR-LIMITED -Do Not Intubate/DNI  Family Communication: plan of care discussed with patient  and sister on phone and also met at Bedside. Her sister [name] is involved in decision making. She is aware of his serious medica lillness Patient status is: Remains hospitalized because of severity of illness Level of care: Progressive   Dispo: The patient is from: Home w/ sister.            Anticipated disposition: TBD  I reached out cardiology nephrology GI, neurology and palliative care team this morning again discussed extensively. ------------------- Objective: Vitals last 24 hrs: Vitals:   12/31/24 0826 12/31/24 1000 12/31/24 1015 12/31/24 1159  BP:      Pulse:    83  Resp:    (!) 24  Temp:  (!) 100.8 F (38.2 C)    TempSrc:  Axillary    SpO2: 100%  100% 98%  Weight:      Height:       Physical Examination: General exam: Eyes closed, briefly opens eyes to voice, mumbling words but  goes back to sleep HEENT:Oral mucosa moist, Ear/Nose WNL grossly Respiratory system: Bilaterally basal crackles, distant breath sound, ON 2L [place] Cardiovascular system: S1 & S2 + irregularly irregular. Gastrointestinal system: Abdomen soft, obese,ND, BS+ Nervous System: Lethargic withdraws to pain  Extremities: extremities warm, chronic hyperkeratotic skin changes on lower extremities with chronic appearing edema Skin: Warm, no rashes MSK: Normal muscle bulk,tone, power   Medications reviewed:  Scheduled Meds:  arformoterol   15 mcg Nebulization BID   budesonide  (PULMICORT ) nebulizer solution  0.25 mg Nebulization BID   Chlorhexidine  Gluconate Cloth  6 each Topical Daily   [START ON 01/01/2025] enoxaparin  (LOVENOX ) injection  100 mg Subcutaneous Q24H   insulin  aspart  0-6 Units Subcutaneous TID WC   pantoprazole  (PROTONIX ) IV  40 mg Intravenous Q24H   revefenacin   175 mcg  Nebulization Daily   Continuous Infusions:  cefTRIAXone  (ROCEPHIN )  IV Stopped (12/30/24 1236)   dextrose  50 mL/hr at 12/31/24 1141   doxycycline  (VIBRAMYCIN ) IV     potassium chloride      Diet: Diet Order             Diet NPO time specified  Diet effective now                   Unresulted Labs (From admission, onward)     Start     Ordered   12/31/24 1215  Meningitis/Encephalitis Panel (CSF)  (Meningitis Panel)  Once,   R        12/31/24 1214   12/31/24 1214  CSF cell count with differential collection tube #: 1  (Meningitis Panel)  Once,   R       Question:  collection tube #  Answer:  1   12/31/24 1214   12/31/24 1214  CSF cell count with differential collection tube #: 4  (Meningitis Panel)  Once,   R       Question:  collection tube #  Answer:  4   12/31/24 1214   12/31/24 1214  CSF culture w Gram Stain  (Meningitis Panel)  Once,   R       Question:  Are there also cytology or pathology orders on this specimen?  Answer:  No   12/31/24 1214   12/31/24 1214  Protein and glucose, CSF  (Meningitis Panel)  Once,   R        12/31/24 1214   12/31/24 1214  Comprehensive metabolic panel  (Meningitis Panel)  Once,   R       Question:  Specimen collection method  Answer:  Lab=Lab collect   12/31/24 1214   12/31/24 1214  CBC with Differential/Platelet  (Meningitis Panel)  Once,   R       Question:  Specimen collection method  Answer:  Lab=Lab collect   12/31/24 1214   12/31/24 1214  HIV Antibody (routine testing w rflx)  (Meningitis Panel)  Once,   R       Question:  Specimen collection method  Answer:  Lab=Lab collect   12/31/24 1214   12/31/24 1214  Culture, blood (Routine X 2)  (Meningitis Panel)  BLOOD CULTURE X 2,   R      12/31/24 1214   12/31/24 1150  EPSTEIN-BARR VIRUS (EBV) Antibody Profile  Once,   R       Question:  Specimen collection method  Answer:  Lab=Lab collect   12/31/24 1149   12/31/24 1150  HSV(herpes simplex vrs) 1+2 ab-IgG  Add-on,   AD  Question:  Specimen collection method  Answer:  Lab=Lab collect   12/31/24 1149   12/31/24 1149  CMV IgM  Add-on,   AD       Question:  Specimen collection method  Answer:  Lab=Lab collect   12/31/24 1149   12/31/24 1149  Cmv antibody, IgG (EIA)  Once,   R       Question:  Specimen collection method  Answer:  Lab=Lab collect   12/31/24 1149   12/31/24 0808  Sodium, urine, random  Once,   R        12/31/24 0807   12/31/24 0808  Creatinine, urine, random  Once,   R        12/31/24 0807   12/31/24 0737  PTH, intact and calcium   Once,   R       Question:  Specimen collection method  Answer:  Lab=Lab collect   12/31/24 0736   12/31/24 0734  Respiratory (~20 pathogens) panel by PCR  (Respiratory panel by PCR (~20 pathogens, ~24 hr TAT)  w precautions)  Once,   R        12/31/24 0733   12/31/24 0720  Culture, blood (Routine X 2) w Reflex to ID Panel  BLOOD CULTURE X 2,   R      12/31/24 0719   12/31/24 0500  CBC  Daily,   R      12/30/24 0945   12/31/24 0500  Comprehensive metabolic panel with GFR  Daily,   R      12/30/24 0945   12/30/24 1525  Protein electrophoresis, serum  Once,   R        12/30/24 1525   12/30/24 1525  UPEP/UIFE/Light Chains/TP, 24-Hr Ur  Once,   R        12/30/24 1525   12/27/24 1530  Expectorated Sputum Assessment w Gram Stain, Rflx to Resp Cult  Once,   R        12/27/24 1529   12/27/24 1002  Expectorated Sputum Assessment w Gram Stain, Rflx to Resp Cult  Once,   R        12/27/24 1001           Data Reviewed: I have personally reviewed following labs and imaging studies ( see epic result tab) CBC: Recent Labs  Lab 12/26/24 2104 12/26/24 2113 12/27/24 0319 12/27/24 0919 12/28/24 0246 12/29/24 0256 12/30/24 0218 12/31/24 0322  WBC 5.9  --  7.3  --  5.4 6.4 8.5 8.0  NEUTROABS 4.7  --   --   --  3.7  --   --   --   HGB 11.4*   < > 10.1* 10.5* 9.5* 10.0* 10.0* 9.9*  HCT 36.9*   < > 32.1* 31.0* 30.3* 32.2* 31.9* 30.8*  MCV 91.8  --  90.7  --  91.0  91.7 90.1 88.5  PLT 165  --  149*  --  158 165 178 183   < > = values in this interval not displayed.   CMP: Recent Labs  Lab 12/28/24 0246 12/29/24 0256 12/30/24 0218 12/31/24 0322 12/31/24 0849  NA 152* 150* 151* 148* 141  K 3.9 3.5 3.5 3.5 3.0*  CL 120* 118* 117* 115* 110  CO2 24 22 23  19* 17*  GLUCOSE 123* 154* 130* 141* 283*  BUN 26* 27* 32* 46* 45*  CREATININE 1.14 1.06 1.51* 2.45* 2.57*  CALCIUM  9.8 10.0 9.8 11.9* 8.8*  MG 2.4  --   --   --   --  PHOS 2.7  --   --   --   --    GFR: Estimated Creatinine Clearance: 27.7 mL/min (A) (by C-G formula based on SCr of 2.57 mg/dL (H)). Recent Labs  Lab 12/27/24 0319 12/28/24 0246 12/29/24 0256 12/30/24 0218 12/31/24 0322  AST 80* 69* 137* 663* 820*  ALT 212* 175* 196* 465* 515*  ALKPHOS 187* 168* 187* 283* 404*  BILITOT 0.3 0.3 0.3 0.3 0.6  PROT 7.2 7.1 7.3 7.0 7.1  ALBUMIN 3.2* 3.1* 3.1* 3.0* 2.8*   No results for input(s): LIPASE, AMYLASE in the last 168 hours.  Recent Labs  Lab 12/27/24 2303 12/30/24 1601  AMMONIA 26 24   Coagulation Profile:  Recent Labs  Lab 12/31/24 1156  INR 1.9*   Antimicrobials/Microbiology: Anti-infectives (From admission, onward)    Start     Dose/Rate Route Frequency Ordered Stop   12/31/24 1300  doxycycline  (VIBRAMYCIN ) 100 mg in sodium chloride  0.9 % 250 mL IVPB        100 mg 125 mL/hr over 120 Minutes Intravenous Every 12 hours 12/31/24 1207 01/05/25 1259   12/29/24 1200  cefTRIAXone  (ROCEPHIN ) 2 g in sodium chloride  0.9 % 100 mL IVPB        2 g 200 mL/hr over 30 Minutes Intravenous Every 24 hours 12/29/24 1019 01/03/25 1159   12/28/24 1400  Ampicillin -Sulbactam (UNASYN ) 3 g in sodium chloride  0.9 % 100 mL IVPB  Status:  Discontinued        3 g 200 mL/hr over 30 Minutes Intravenous Every 6 hours 12/28/24 1121 12/29/24 1019   12/27/24 1000  vancomycin  (VANCOREADY) IVPB 500 mg/100 mL  Status:  Discontinued        500 mg 100 mL/hr over 60 Minutes Intravenous Every 12  hours 12/27/24 0038 12/28/24 1109   12/27/24 0600  ceFEPIme  (MAXIPIME ) 2 g in sodium chloride  0.9 % 100 mL IVPB  Status:  Discontinued        2 g 200 mL/hr over 30 Minutes Intravenous Every 8 hours 12/27/24 0038 12/28/24 1109   12/26/24 2330  vancomycin  (VANCOREADY) IVPB 2000 mg/400 mL        2,000 mg 200 mL/hr over 120 Minutes Intravenous  Once 12/26/24 2321 12/27/24 0217   12/26/24 2330  piperacillin -tazobactam (ZOSYN ) IVPB 3.375 g        3.375 g 100 mL/hr over 30 Minutes Intravenous  Once 12/26/24 2322 12/27/24 0019         Component Value Date/Time   SDES BLOOD RIGHT FOREARM 12/26/2024 2125   SPECREQUEST  12/26/2024 2125    BOTTLES DRAWN AEROBIC AND ANAEROBIC Blood Culture adequate volume   CULT  12/26/2024 2125    NO GROWTH 5 DAYS Performed at [facility], [address], [place], [name] [address]    REPTSTATUS 12/31/2024 FINAL 12/26/2024 2125    Procedures:    Mennie LAMY, MD [facility] 12/31/2024, 1:25 PM   "

## 2024-12-31 NOTE — Progress Notes (Signed)
 PHARMACY - ANTICOAGULATION CONSULT NOTE  Pharmacy Consult for enoxaparin  Indication: atrial fibrillation  Allergies[1]  Patient Measurements: Height: 5' 4 (162.6 cm) Weight: 105.6 kg (232 lb 12.9 oz) IBW/kg (Calculated) : 59.2 HEPARIN  DW (KG): 83  Vital Signs: Temp: 101.1 F (38.4 C) (02/06 0749) Temp Source: Oral (02/06 0749) BP: 121/46 (02/06 0749) Pulse Rate: 115 (02/06 0749)  Labs: Recent Labs    12/29/24 0256 12/30/24 0218 12/31/24 0322 12/31/24 0849  HGB 10.0* 10.0* 9.9*  --   HCT 32.2* 31.9* 30.8*  --   PLT 165 178 183  --   CREATININE 1.06 1.51* 2.45* 2.57*    Estimated Creatinine Clearance: 27.7 mL/min (A) (by C-G formula based on SCr of 2.57 mg/dL (H)).   Medical History: Past Medical History:  Diagnosis Date   Atrial fibrillation with RVR (HCC) 07/08/2015   Bilateral lower extremity edema    Chronic diastolic CHF (congestive heart failure) (HCC) 08/15/2015   a. 06/2015: echo showing EF of 65-70% with Grade 2 DD   Diabetes (HCC)    INSULIN  DEPENDENT   Dysrhythmia    Hyperglycemia due to type 2 diabetes mellitus (HCC) 04/11/2021   Hyperlipidemia    Hypertension    Hypoglycemia 05/20/2014   Obesity    Obesity    Paroxysmal atrial fibrillation (HCC)    Sleep apnea     Medications:  Scheduled:   arformoterol   15 mcg Nebulization BID   budesonide  (PULMICORT ) nebulizer solution  0.25 mg Nebulization BID   Chlorhexidine  Gluconate Cloth  6 each Topical Daily   [START ON 01/01/2025] enoxaparin  (LOVENOX ) injection  100 mg Subcutaneous Q24H   insulin  aspart  0-6 Units Subcutaneous TID WC   pantoprazole  (PROTONIX ) IV  40 mg Intravenous Q24H   revefenacin   175 mcg Nebulization Daily    Assessment: 73 yom with history of Afib and dementia presenting on 2/1 with altered mental status and SOB. CT head showing remote lacunar infarct in R thalamus, suspected additional remote lacunar infarct in L thalamus, and remote infarct in R cerebellum (no LVO). MRI  showing no acute infarct. On apixaban  PTA for hx Afib (LD in hospital on 2/4 @ 1006).  Scr continues to increase up to 2.5 (CrCl 27 mL/min). Weight 105.6 kg.   Goal of Therapy:  Anti-Xa level 0.6-1 units/ml 4hrs after LMWH dose given Monitor platelets by anticoagulation protocol: Yes   Plan:  Reduce enoxaparin  100 mg (1 mg/kg) to every 24 hours Monitor CBC and for s/sx of bleeding  F/u ability to transition back to DOAC pending mental status  Thank you for allowing pharmacy to participate in this patient's care,  Suzen Sour, PharmD, BCCCP Clinical Pharmacist  Phone: [phone] 12/31/2024 9:38 AM  Please check AMION for all [facility] Pharmacy phone numbers After 10:00 PM, call Main Pharmacy [phone]       [1] No Known Allergies

## 2025-01-18 ENCOUNTER — Inpatient Hospital Stay: Admitting: Diagnostic Neuroimaging

## 2025-03-02 ENCOUNTER — Ambulatory Visit: Admitting: Podiatry
